# Patient Record
Sex: Female | Born: 1945 | Race: Black or African American | Hispanic: No | Marital: Single | Smoking: Former smoker
Health system: Southern US, Community
[De-identification: ages and names within clinical notes are randomized; demographics above are authoritative.]

## PROBLEM LIST (undated history)

## (undated) DIAGNOSIS — R55 Syncope and collapse: Secondary | ICD-10-CM

## (undated) DIAGNOSIS — E785 Hyperlipidemia, unspecified: Secondary | ICD-10-CM

## (undated) DIAGNOSIS — I10 Essential (primary) hypertension: Secondary | ICD-10-CM

## (undated) DIAGNOSIS — N189 Chronic kidney disease, unspecified: Secondary | ICD-10-CM

## (undated) DIAGNOSIS — K279 Peptic ulcer, site unspecified, unspecified as acute or chronic, without hemorrhage or perforation: Secondary | ICD-10-CM

## (undated) DIAGNOSIS — D649 Anemia, unspecified: Secondary | ICD-10-CM

## (undated) DIAGNOSIS — I480 Paroxysmal atrial fibrillation: Secondary | ICD-10-CM

## (undated) DIAGNOSIS — K219 Gastro-esophageal reflux disease without esophagitis: Secondary | ICD-10-CM

## (undated) DIAGNOSIS — E876 Hypokalemia: Secondary | ICD-10-CM

## (undated) HISTORY — PX: CATARACT EXTRACTION: SUR2

## (undated) HISTORY — DX: Hyperlipidemia, unspecified: E78.5

## (undated) HISTORY — DX: Essential (primary) hypertension: I10

## (undated) HISTORY — DX: Gastro-esophageal reflux disease without esophagitis: K21.9

## (undated) HISTORY — PX: ABDOMINAL HYSTERECTOMY: SHX81

## (undated) HISTORY — DX: Peptic ulcer, site unspecified, unspecified as acute or chronic, without hemorrhage or perforation: K27.9

## (undated) HISTORY — DX: Syncope and collapse: R55

## (undated) HISTORY — DX: Paroxysmal atrial fibrillation: I48.0

## (undated) HISTORY — PX: LIPOMA RESECTION: SHX23

## (undated) NOTE — *Deleted (*Deleted)
Patient seen and discussed with PA Ahmed Prima, I agree with her documentation. 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   Regarding her afib Most recently has been on flecanide by Dr Lovena Le. Initially afib symptoms improved. 10/04/20 phone call with recurrent afib symptoms, flecanide was increased to 100mg  bid on 10/10/20. From prior notes has not tolerated av nodal agents prior due to bradycardia.   Reports dizziness, generlaized weakness, near syncope at home.   WBC 7.6 Hgb 9.1 Plt 424 BNP 893 K 3.6 Cr 4.30 (was 2.6 during 08/2020 labd) BUN 59 hstrop 29--> 27 COVID neg CXR no acute process EKG    Difficultly controlling afib as reported above. Intolerant to av nodal agents due to bradycardia. Has been on flecanide. Of note even prior to 10/18 increase of flecanide to 100mg  bid she was in a junctional rhythm at a 09/11/20 ER visit. Today junctional in the 40s initially. Later in the day SR but sinus pauses longest 4.6 seconds. Flecanide stopped. 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 May require pacemaker placement, would recommend EP evaluation this admission. Recommend transfer to [facility] to medicine service, further workup for AKI on CKD and anemia per medicine, will have EP evaluate for tachy brady syndrome and possible pacemaker placement. Hold eliquis for now. Would use prn IV lopressor 2.5mg  if issues with afib with RVR this admission.      Carlyle Dolly MD

---

## 2007-04-02 ENCOUNTER — Ambulatory Visit (HOSPITAL_COMMUNITY): Admission: RE | Admit: 2007-04-02 | Discharge: 2007-04-02 | Payer: Self-pay

## 2007-05-26 ENCOUNTER — Ambulatory Visit: Payer: Self-pay | Admitting: Cardiology

## 2007-05-26 ENCOUNTER — Ambulatory Visit: Payer: Self-pay | Admitting: Gastroenterology

## 2007-06-13 ENCOUNTER — Encounter: Payer: Self-pay | Admitting: Cardiology

## 2007-06-13 ENCOUNTER — Ambulatory Visit: Payer: Self-pay

## 2007-10-15 ENCOUNTER — Ambulatory Visit: Payer: Self-pay

## 2007-10-15 ENCOUNTER — Ambulatory Visit: Payer: Self-pay | Admitting: Cardiology

## 2007-10-15 LAB — CONVERTED CEMR LAB
BUN: 11 mg/dL (ref 6–23)
Chloride: 97 meq/L (ref 96–112)
GFR calc Af Amer: 82 mL/min
Glucose, Bld: 129 mg/dL — ABNORMAL HIGH (ref 70–99)
Sodium: 139 meq/L (ref 135–145)

## 2007-10-17 ENCOUNTER — Ambulatory Visit: Payer: Self-pay

## 2007-10-17 ENCOUNTER — Ambulatory Visit (HOSPITAL_COMMUNITY): Admission: RE | Admit: 2007-10-17 | Discharge: 2007-10-17 | Payer: Self-pay | Admitting: Cardiology

## 2007-10-27 ENCOUNTER — Ambulatory Visit: Payer: Self-pay | Admitting: Cardiology

## 2007-12-11 ENCOUNTER — Ambulatory Visit: Payer: Self-pay | Admitting: Cardiology

## 2008-04-22 ENCOUNTER — Ambulatory Visit: Payer: Self-pay | Admitting: Cardiology

## 2008-09-23 ENCOUNTER — Ambulatory Visit: Payer: Self-pay | Admitting: Cardiology

## 2009-08-22 ENCOUNTER — Encounter: Payer: Self-pay | Admitting: Cardiology

## 2009-12-01 ENCOUNTER — Ambulatory Visit: Payer: Self-pay | Admitting: Orthopedic Surgery

## 2009-12-01 ENCOUNTER — Telehealth: Payer: Self-pay | Admitting: Orthopedic Surgery

## 2009-12-01 DIAGNOSIS — M5430 Sciatica, unspecified side: Secondary | ICD-10-CM | POA: Insufficient documentation

## 2010-01-11 ENCOUNTER — Telehealth: Payer: Self-pay | Admitting: Orthopedic Surgery

## 2010-05-03 ENCOUNTER — Ambulatory Visit: Payer: Self-pay | Admitting: Cardiology

## 2010-05-03 DIAGNOSIS — I10 Essential (primary) hypertension: Secondary | ICD-10-CM | POA: Insufficient documentation

## 2010-05-03 DIAGNOSIS — R55 Syncope and collapse: Secondary | ICD-10-CM | POA: Insufficient documentation

## 2010-05-04 ENCOUNTER — Encounter: Payer: Self-pay | Admitting: Cardiology

## 2010-05-05 ENCOUNTER — Telehealth (INDEPENDENT_AMBULATORY_CARE_PROVIDER_SITE_OTHER): Payer: Self-pay | Admitting: *Deleted

## 2010-05-09 ENCOUNTER — Encounter: Payer: Self-pay | Admitting: Orthopedic Surgery

## 2010-05-17 ENCOUNTER — Ambulatory Visit: Payer: Self-pay | Admitting: Orthopedic Surgery

## 2010-05-17 DIAGNOSIS — S92309A Fracture of unspecified metatarsal bone(s), unspecified foot, initial encounter for closed fracture: Secondary | ICD-10-CM | POA: Insufficient documentation

## 2010-06-08 ENCOUNTER — Encounter: Payer: Self-pay | Admitting: Orthopedic Surgery

## 2010-06-28 ENCOUNTER — Ambulatory Visit: Payer: Self-pay | Admitting: Orthopedic Surgery

## 2010-11-29 ENCOUNTER — Encounter: Payer: Self-pay | Admitting: Orthopedic Surgery

## 2011-01-23 NOTE — Progress Notes (Signed)
Summary: Triam/HCTZ not available  Medications Added HYDROCHLOROTHIAZIDE 50 MG TABS (HYDROCHLOROTHIAZIDE) Take one tablet by mouth daily.       Phone Note From Pharmacy   Summary of Call: Received faxed note from [facility] [place] that Triam/HCTZ is not available in any strength. Spoke with pharmacist who states there is a Electrical engineer. She states some pharmacies are getting sporatic supplies of this, but they do not have it. Can this be changed to another medication? Initial call taken by: Gurney Maxin, RN, BSN,  May 05, 2010 4:32 PM  Follow-up for Phone Call        [name] [name] - see last note.  Could use HCTZ 50 mg daily instead. Follow-up by: Beckie Salts, MD, [facility],  May 08, 2010 3:17 PM  Additional Follow-up for Phone Call Additional follow up Details #1::        Prescription will be sent for this.    New/Updated Medications: HYDROCHLOROTHIAZIDE 50 MG TABS (HYDROCHLOROTHIAZIDE) Take one tablet by mouth daily. Prescriptions: HYDROCHLOROTHIAZIDE 50 MG TABS (HYDROCHLOROTHIAZIDE) Take one tablet by mouth daily.  #30 x 6   Entered by:   Gurney Maxin, RN, BSN   Authorized by:   Beckie Salts, MD, [facility]   Signed by:   Gurney Maxin, RN, BSN on 05/08/2010   Method used:   Electronically to        [facility] [place] (retail)       [place]  [address]       Ph: [phone]       Fax: [phone]   RxID:   [id number]

## 2011-01-23 NOTE — Progress Notes (Signed)
Summary: call from patient still having pain LT hip   Phone Note Call from Patient   Caller: Patient Summary of Call:  Patienit called to relay that she's having LT hip and LT side of back pain.  Had used dose pack per December appt.  Asking if there is anything else to be prescribed or advise re-check?   States has refill Gabapentin for the other problem, RT side hip, pain, numbness. Ph [phone] [facility] [place] Initial call taken by: [name],  January 11, 2010 10:01 AM  Follow-up for Phone Call        refill dose pack and neurontin take both  Follow-up by: Arther Abbott MD,  January 11, 2010 11:24 AM    Prescriptions: NEURONTIN 100 MG CAPS (GABAPENTIN) 1 by mouth at bedtime  #30 x 1   Entered by:   Peter Minium   Authorized by:   Arther Abbott MD   Signed by:   Peter Minium on 01/11/2010   Method used:   [facility] ...       [facility]  [address] [phone][facility] (retail)       [address]       [place], [place]  [address]       Ph: [phone] or [phone]       Fax: [phone]   RxID:   [id number] PREDNISONE (PAK) 5 MG TABS (PREDNISONE) as directed  #1 x 0   Entered by:   Peter Minium   Authorized by:   Arther Abbott MD   Signed by:   Peter Minium on 01/11/2010   Method used:   [facility] ...       [facility]  [address] [phone]* (retail)       [address]       [place], [place]  [address]       Ph: [phone] or [phone]       Fax: [phone]   RxID:   [id number]

## 2011-01-23 NOTE — Letter (Signed)
Summary: History form  History form   Imported By: Ruffin Pyo 05/19/2010 09:34:33  _____________________________________________________________________  External Attachment:    Type:   Image     Comment:   External Document

## 2011-01-23 NOTE — Assessment & Plan Note (Signed)
Summary: NEW PROB/FX 5TH METATARSAL/XR [place]/BRING'G FILM,RPT/REF C...   Vital Signs:  Patient profile:   65 year old female Height:      62 inches Weight:      186 pounds Pulse rate:   76 / minute Resp:     16 per minute  Vitals Entered By: Arther Abbott MD (May 17, 2010 9:35 AM)  Visit Type:  new problem Referring Provider:  self Primary Provider:  Dr. Ramond Dial  CC:  right foot pain.  History of Present Illness: I saw [name] [name] in the office today for a followup visit.  She is a 65 years old woman with the complaint of:  right foot, 5th metatarsal fracture.  DOI 05/14/10.  [facility] xrays 05/16/10 right foot for review.  Meds: Maxzide, Lasix, Prilosec, Beta blocker, Potassium, Diovan, Uloric, Colcrys.  throbbing pain 9/10, worse with walking, sudden onset with injury.  She twisted her foot while ambulating. Her foot was caught in someone else's cane.  Pain was not relieved by ibuprofen 600 mg  She is a noticeable limp    Allergies: 1)  ! * Tylenol With Codeine 2)  ! * Tramadol 3)  ! * Allpurinol  Social History: Patient is single Tobacco Use - No Alcohol Use - no Retired   Review of Systems Immunology:  Complains of seasonal allergies; denies sinus problems and allergic to bee stings.  The review of systems is negative for Constitutional, Cardiovascular, Respiratory, Gastrointestinal, Genitourinary, Neurologic, Musculoskeletal, Endocrine, Psychiatric, Skin, HEENT, and Hemoatologic.   Foot/Ankle Exam  General:    Well-developed, well-nourished ,normal body habitus; no deformities, normal grooming.    Gait:    antalgic.    Skin:    Intact with no scars, lesions, rashes, cafe-au-lait spots or bruising.    Inspection:    RT FOOT LATAERAL AND DORSAL swelling:   Palpation:    tenderness R-MID FOOT LATAERALLY tenderness R-forefoot:   Vascular:    dorsalis pedis and posterior tibial pulses 2+ and symmetric, capillary refill < 2  seconds, normal hair pattern, no evidence of ischemia.   Sensory:    gross sensation intact bilaterally in lower extremities.    Motor:    Motor strength 5/5 bilaterally for ankle dorsiflexion, ankle plantar flexion, ankle inversion and ankle eversion.    Ankle Exam:    Right:    Inspection:  Normal    Palpation:  Normal    Stability:  stable    Left:    Inspection:  Normal    Palpation:  Normal    Stability:  stable  Foot Exam:    Right:    Inspection:  Abnormal    Palpation:  Abnormal    Stability:  stable    Tenderness:  yes    Swelling:  yes    Left:    Inspection:  Normal    Palpation:  Normal   Impression & Recommendations:  Problem # 1:  CLOSED FRACTURE OF METATARSAL BONE (ICD-825.25) Assessment New  [facility]   Orders: New Patient Level III [id number]) Metatarsal Fx [id number])  SHORT CAM WALKER   Medications Added to Medication List This Visit: 1)  Norco 5-325 Mg Tabs (Hydrocodone-acetaminophen) .... One by mouth q 4 hrs as needed pain  Patient Instructions: 1)  no weight bearing x 6 weeks  2)  xrays in 6 weeks  Prescriptions: NORCO 5-325 MG TABS (HYDROCODONE-ACETAMINOPHEN) ONE by mouth Q 4 HRS as needed PAIN  #60 x 1   Entered and Authorized  by:   Arther Abbott MD   Signed by:   Arther Abbott MD on 05/17/2010   Method used:   Print then Give to Patient   RxID:   [id number]

## 2011-01-23 NOTE — Assessment & Plan Note (Signed)
Summary: rov  Medications Added ULORIC 40 MG TABS (FEBUXOSTAT) take 1 tab daily INDOCIN 25 MG/5ML SUSP (INDOMETHACIN) take as needed      Allergies Added:   Visit Type:  Follow-up Primary Provider:  Dr. Ramond Dial   History of Present Illness: 65 year old woman presents for a followup visit. She was last seen in October 2009. She reports doing well without any frank syncope. She has only occasional orthostatic dizziness that does not significantly limit her. She reports working in her garden without functional limitation. She's had no chest pain or palpitations.  Current Medications (verified): 1)  Klor-Con 10 10 Meq Cr-Tabs (Potassium Chloride) .... Take 1 Tablet By Mouth Once A Day 2)  Metoprolol Succinate 25 Mg Xr24h-Tab (Metoprolol Succinate) .... Take 1 Tablet By Mouth Once A Day 3)  Triamterene-Hctz 75-50 Mg Tabs (Triamterene-Hctz) .... Take 1 Tablet By Mouth Once A Day 4)  Prilosec 40 Mg Cpdr (Omeprazole) .... Take 1 Tablet By Mouth Once A Day 5)  Lasix 20 Mg Tabs (Furosemide) .... Take 1 Tablet By Mouth Once A Day 6)  Diovan Hct 160-25 Mg Tabs (Valsartan-Hydrochlorothiazide) .... Take One-Half By Mouth Every Other Day 7)  Simvastatin 40 Mg Tabs (Simvastatin) .... Take 1 Tablet By Mouth Once A Day 8)  Meclizine Hcl 25 Mg Tabs (Meclizine Hcl) .... Take One-Half By Mouth Three Times A Day 9)  Colchicine 0.6 Mg Tabs (Colchicine) .... Use As Directed 10)  Neurontin 100 Mg Caps (Gabapentin) .Marland Kitchen.. 1 By Mouth At Bedtime 11)  Uloric 40 Mg Tabs (Febuxostat) .... Take 1 Tab Daily 12)  Indocin 25 Mg/41ml Susp (Indomethacin) .... Take As Needed  Allergies (verified): 1)  ! * Tylenol With Codeine 2)  ! * Tramadol 3)  ! * Allpurinol  Past History:  Past Medical History: Last updated: 04/10/2010 G E R D Hyperlipidemia Hypertension PUD Gout Neurocardiogenic syncope  Social History: Last updated: 04/10/2010 Patient is single Tobacco Use - No Alcohol Use - no Retired    Clinical Review Panels:  Echocardiogram Echocardiogram SUMMARY   -  Overall left ventricular systolic function was normal. Left         ventricular ejection fraction was estimated , range being 55         % to 65 %. There were no left ventricular regional wall         motion abnormalities. Left ventricular wall thickness was         mildly increased.   -  Aortic valve thickness was mildly increased. There was trivial         aortic valvular regurgitation.   -  There was mild mitral annular calcification.   -  The left atrium was mildly dilated.   -  The estimated peak pulmonary artery systolic pressure was mildly         increased. (06/13/2007)    Review of Systems  The patient denies anorexia, fever, weight loss, chest pain, syncope, dyspnea on exertion, peripheral edema, melena, and hematochezia.         She has had some problems with gout flares. Otherwise reviewed and negative.  Vital Signs:  Patient profile:   65 year old female Height:      63 inches Weight:      189 pounds BMI:     33.60 Pulse rate:   83 / minute BP sitting:   135 / 73  (right arm)  Vitals Entered By: Doretha Sou, CNA (May 03, 2010 1:38 PM)  Physical  Exam  Additional Exam:  Overweight woman in no acute distress. HEENT: Conjunctiva and lids normal, oropharynx clear. Neck: Supple, no elevated JVP or bruits. Lungs: Clear to auscultation, nonlabored. Cardiac: Regular rate and rhythm, no S3. Pharynx: No pitting edema. Skin: Warm and dry.   EKG  Procedure date:  05/03/2010  Findings:      Sinus rhythm at 68 beats per minute with nonspecific ST-T wave changes.  Impression & Recommendations:  Problem # 1:  VASOVAGAL SYNCOPE (ICD-780.2)  No frank recurrences, tolerating low-dose beta blocker therapy well. We will go to a one-year visit. She will let us know if symptoms recur with more frequency. Refill given for metoprolol.  The following medications were removed from the medication  list:    Prednisone (pak) 5 Mg Tabs (Prednisone) .Marland Kitchen... As directed    Prednisone (pak) 5 Mg Tabs (Prednisone) .Marland Kitchen... As directed for 12 days Her updated medication list for this problem includes:    Metoprolol Succinate 25 Mg Xr24h-tab (Metoprolol succinate) .Marland Kitchen... Take 1 tablet by mouth once a day  Problem # 2:  ESSENTIAL HYPERTENSION, BENIGN (ICD-401.1)  Patient needed limited refills for triamterene/HCTZ and also Lasix. These were provided.  Her updated medication list for this problem includes:    Metoprolol Succinate 25 Mg Xr24h-tab (Metoprolol succinate) .Marland Kitchen... Take 1 tablet by mouth once a day    Triamterene-hctz 75-50 Mg Tabs (Triamterene-hctz) .Marland Kitchen... Take 1 tablet by mouth once a day    Lasix 20 Mg Tabs (Furosemide) .Marland Kitchen... Take 1 tablet by mouth once a day    Diovan Hct 160-25 Mg Tabs (Valsartan-hydrochlorothiazide) .Marland Kitchen... Take one-half by mouth every other day  Patient Instructions: 1)  Your physician recommends that you schedule a follow-up appointment in: 1 year Prescriptions: METOPROLOL SUCCINATE 25 MG XR24H-TAB (METOPROLOL SUCCINATE) Take 1 tablet by mouth once a day  #90 x 3   Entered by:   Tye Savoy RN   Authorized by:   Beckie Salts, MD, [facility]   Signed by:   Tye Savoy RN on 05/03/2010   Method used:   Print then Give to Patient   RxID:   [id number] LASIX 20 MG TABS (FUROSEMIDE) Take 1 tablet by mouth once a day  #30 x 0   Entered by:   Tye Savoy RN   Authorized by:   Beckie Salts, MD, [facility]   Signed by:   Tye Savoy RN on 05/03/2010   Method used:   Electronically to        [facility] [place] (retail)       [place]  [address]       Ph: [phone]       Fax: [phone]   RxID:   [id number] TRIAMTERENE-HCTZ 75-50 MG TABS (TRIAMTERENE-HCTZ) Take 1 tablet by mouth once a day  #30 x 0   Entered by:   Tye Savoy RN   Authorized by:   Beckie Salts, MD, [facility]   Signed by:   Tye Savoy RN on 05/03/2010   Method used:   Electronically to        [facility]. [place] (retail)       [place]  [address]       Ph: [phone]       Fax: [phone]   RxID:   [id number] METOPROLOL SUCCINATE 25 MG XR24H-TAB (METOPROLOL SUCCINATE) Take  1 tablet by mouth once a day  #90 x 3   Entered by:   Tye Savoy RN   Authorized by:   Beckie Salts, MD, [facility]   Signed by:   Tye Savoy RN on 05/03/2010   Method used:   Electronically to        [facility]. [place] (retail)       [place]  [address]       Ph: [phone]       Fax: [phone]   RxID:   [id number]

## 2011-01-23 NOTE — Assessment & Plan Note (Signed)
Summary: 6 WK RE-CK/XRAY RT FOOT/METATARS/BCBS/CAF   Visit Type:  Follow-up Referring Provider:  self Primary Provider:  Dr. Ramond Dial  CC:  fx care rt foot.  History of Present Illness: I saw [name] [name] in the office today for a followup visit.  She is a 65 years old woman with the complaint of:  right foot, 5th metatarsal fracture.  DOI 05/14/10.  [facility] xrays 05/16/10 right foot for review.  Meds: Maxzide, Lasix, Prilosec, Beta blocker, Potassium, Diovan, Uloric, Colcrys, Norco 5 for pain.  Today is 6 week recheck xray rt foot after cam walker and no weightbearing.  No problems, pain med only as needed.  Says she has been putting weight on foot the whole time.  Allergies: 1)  ! * Tylenol With Codeine 2)  ! * Tramadol 3)  ! * Allpurinol  Physical Exam  Additional Exam:  Normal appearance, normal pulse in the RIGHT foot, skin normal, sensation normal.  Patient ambulating full weightbearing despite our recommendations  No tenderness at the fracture site ankle range of motion is normal muscle tone and the foot is normal  No swelling.  X-rays were obtained   Impression & Recommendations:  Problem # 1:  CLOSED FRACTURE OF METATARSAL BONE (ICD-825.25) Assessment Improved  x-ray RIGHT foot 3 views true Jones fracture proximal fifth metatarsal does not show any signs of healing.  This is not unusual.  She's been non-compliant weightbearing which makes it even more so she is nontender  try a foot orthotic for her if this proves to be successful fine if not drive to go in a nonweightbearing cast for  Orders: Post-Op Check [id number]) Foot x-ray complete, minimum 3 views [id number])  Patient Instructions: 1)  wear shoe insert x 6 weeks  2)  follow up as needed

## 2011-01-23 NOTE — Medication Information (Signed)
Summary: RX Folder  RX Folder   Imported By: [name] 06/28/2010 19:20:18  _____________________________________________________________________  External Attachment:    Type:   Image     Comment:   External Document

## 2011-01-26 NOTE — Miscellaneous (Signed)
Summary: Physician's order for walker  Physician's order for walker   Imported By: Ruffin Pyo 06/08/2010 15:36:59  _____________________________________________________________________  External Attachment:    Type:   Image     Comment:   External Document

## 2011-02-22 ENCOUNTER — Encounter: Payer: Self-pay | Admitting: Orthopedic Surgery

## 2011-02-22 ENCOUNTER — Ambulatory Visit (INDEPENDENT_AMBULATORY_CARE_PROVIDER_SITE_OTHER): Payer: Medicare PPO | Admitting: Orthopedic Surgery

## 2011-02-22 DIAGNOSIS — M6740 Ganglion, unspecified site: Secondary | ICD-10-CM

## 2011-02-27 ENCOUNTER — Encounter: Payer: Self-pay | Admitting: Orthopedic Surgery

## 2011-03-01 NOTE — Assessment & Plan Note (Signed)
Summary: CYST ON RT HAND  BRINGING FILM/REPORT/BCBS/BSF    NEW PROBLEM   Visit Type:  new problem Referring Provider:  self Primary Provider:  Dr. Ramond Dial  CC:  right hand pain.  History of Present Illness: I saw [name] [name] in the office today for an initial visit.  She is a 65 years old woman with the complaint of:  right hand  This is a 65 year old female, who has had a cyst on the back of her wrist with wrist pain since December of last year, but has since gotten better from wearing a brace. She does complain of some numbness and tingling in the thumb, index, and long finger at the fingertips.   She also has some back pain.  Medications: Diovan, Uloric, Poassium, Simvastatin, Maxide, Lasix, Metoprolol, Celebrex.  Allergies: 1)  ! * Tylenol With Codeine 2)  ! * Tramadol 3)  ! * Allpurinol  Physical Exam  Msk:  The patient is well developed and nourished, with normal grooming and hygiene. The body habitus is   ML  Pulses:  pulses normal in right hand  Extremities:  right dorsal wrist small ulnar sided ganglion   no tenderenss   normal range wrist  Neurologic:  normal soft touch  Skin:  intact without lesions or rashes Psych:  alert and cooperative; normal mood and affect; normal attention span and concentration   Impression & Recommendations:  Problem # 1:  GANGLION OF JOINT (ICD-727.41) Assessment New outside film on disc reviewed and report reviewed : NO acute diseases  Orders: Est. Patient Level III [id number])  Patient Instructions: 1)  Call us if any pain or the cyst changes in size    Orders Added: 1)  Est. Patient Level III [id number]

## 2011-05-01 ENCOUNTER — Ambulatory Visit (INDEPENDENT_AMBULATORY_CARE_PROVIDER_SITE_OTHER): Payer: Medicare PPO | Admitting: Cardiology

## 2011-05-01 ENCOUNTER — Ambulatory Visit: Payer: Medicare PPO | Admitting: Cardiology

## 2011-05-01 ENCOUNTER — Encounter: Payer: Self-pay | Admitting: Cardiology

## 2011-05-01 VITALS — BP 130/72 | HR 65 | Wt 185.0 lb

## 2011-05-01 DIAGNOSIS — I10 Essential (primary) hypertension: Secondary | ICD-10-CM

## 2011-05-01 DIAGNOSIS — R55 Syncope and collapse: Secondary | ICD-10-CM

## 2011-05-01 MED ORDER — METOPROLOL SUCCINATE ER 25 MG PO TB24: 25.0000 mg | ORAL_TABLET | Freq: Every day | ORAL | Status: DC

## 2011-05-01 NOTE — Assessment & Plan Note (Signed)
No recurrence. Overall stable on beta blocker therapy.

## 2011-05-01 NOTE — Assessment & Plan Note (Signed)
No changes made in medical regimen today. We did discuss a basic walking regimen. Follow up with primary care.

## 2011-05-01 NOTE — Patient Instructions (Addendum)
**Note De-identified Yumi Insalaco Obfuscation** Your physician recommends that you continue on your current medications as directed. Please refer to the Current Medication list given to you today.  Your physician recommends that you schedule a follow-up appointment in: 1 year  

## 2011-05-01 NOTE — Progress Notes (Signed)
Clinical Summary [name] [name] is a 65 y.o.female presenting for followup. She was seen in May 2011.  She reports 2 episodes of dizziness over the last year, not associated with frank syncope. These occurred when she was standing up from a crouched position a number of times. She reports compliance with her medications.  She is not exercising regularly, does do some work in her garden. We talked about pursuing a basic walking regimen. Otherwise no changes anticipated at this time.   Allergies  Allergen Reactions  . Allopurinol   . Tramadol   . Tylenol-Codeine     Current outpatient prescriptions:colchicine 0.6 MG tablet, Take 0.6 mg by mouth daily. As directed   , Disp: , Rfl: ;  diphenhydrAMINE (SOMINEX) 25 MG tablet, Take 25 mg by mouth as needed.  , Disp: , Rfl: ;  febuxostat (ULORIC) 40 MG tablet, Take 80 mg by mouth daily.  , Disp: , Rfl: ;  furosemide (LASIX) 20 MG tablet, Take 20 mg by mouth daily.  , Disp: , Rfl:  gabapentin (NEURONTIN) 100 MG tablet, Take 100 mg by mouth at bedtime.  , Disp: , Rfl: ;  hydrochlorothiazide 50 MG tablet, Take 50 mg by mouth daily.  , Disp: , Rfl: ;  HYDROcodone-acetaminophen (NORCO) 5-325 MG per tablet, Take 1 tablet by mouth every 4 (four) hours as needed.  , Disp: , Rfl: ;  meclizine (ANTIVERT) 25 MG tablet, Take 25 mg by mouth 3 (three) times daily as needed. Take One half  Tablet by mouth three times a day  , Disp: , Rfl:  metoprolol succinate (TOPROL XL) 25 MG 24 hr tablet, Take 25 mg by mouth daily.  , Disp: , Rfl: ;  omeprazole (PRILOSEC) 40 MG capsule, Take 40 mg by mouth daily.  , Disp: , Rfl: ;  potassium chloride SA (KLOR-CON M10) 10 MEQ tablet, Take 10 mEq by mouth daily.  , Disp: , Rfl: ;  simvastatin (ZOCOR) 40 MG tablet, Take 40 mg by mouth daily.  , Disp: , Rfl: ;  valsartan (DIOVAN) 160 MG tablet, Take 160 mg by mouth daily.  , Disp: , Rfl:  DISCONTD: indomethacin (INDOCIN) 25 MG/5ML SUSP, Take by mouth as needed.  , Disp: , Rfl: ;  DISCONTD:  valsartan-hydrochlorothiazide (DIOVAN-HCT) 160-25 MG per tablet, Take 1 tablet by mouth. Take one half tablet by mouth every other day , Disp: , Rfl:   Past Medical History  Diagnosis Date  . GERD (gastroesophageal reflux disease)   . Hyperlipidemia   . Hypertension   . PUD (peptic ulcer disease)   . Gout   . Syncope     Neurocardiogenic    Social History Ms. Garvey reports that she has quit smoking. Her smoking use included Cigarettes. She has never used smokeless tobacco. Ms. Aguas reports that she does not drink alcohol.  Review of Systems No fevers chills, cough, progressive shortness of breath, chest pain. Otherwise negative.  Physical Examination Filed Vitals:   05/01/11 1258  BP: 130/72  Pulse: 65  Overweight woman in no acute distress. HEENT: Conjunctiva and lids normal, oropharynx clear. Neck: Supple, no elevated JVP or bruits. Lungs: Clear to auscultation, nonlabored. Cardiac: Regular rate and rhythm, no S3. Pharynx: No pitting edema. Skin: Warm and dry.  Studies Echocardiogram 06/13/2007: -  Overall left ventricular systolic function was normal. Left         ventricular ejection fraction was estimated , range being 55         % to  65 %. There were no left ventricular regional wall         motion abnormalities. Left ventricular wall thickness was         mildly increased.   -  Aortic valve thickness was mildly increased. There was trivial         aortic valvular regurgitation.   -  There was mild mitral annular calcification.   -  The left atrium was mildly dilated.   -  The estimated peak pulmonary artery systolic pressure was mildly         increased.   Problem List and Plan

## 2011-05-08 NOTE — Assessment & Plan Note (Signed)
[facility]                            CARDIOLOGY OFFICE NOTE   NAME:[name], [name] PEPI                      MRN:          [id number]  DATE:04/22/2008                            DOB:          1946/02/26    PRIMARY CARE PHYSICIAN:  Andres Shad, M.D.   REASON FOR VISIT:  Follow up dizziness and previous history of syncope.   HISTORY OF PRESENT ILLNESS:  [name] [name] comes back in for a routine  visit.  She states that she has actually done very well.  She has not  had any frank syncope.  She has been tolerating Toprol XL and thinks  that this has been helping.  She has only had one brief episode of  dizziness since I last saw her.  No frank palpitations.  No exertional  chest pain.  No significant exertional dyspnea.  She does have some  reflux symptoms.  Electrocardiogram today shows sinus rhythm with  nonspecific T-wave changes, specifically in the lateral leads.  These  are somewhat progressive compared to the previous tracing from October  of 2008.   ALLERGIES:  NO KNOWN DRUG ALLERGIES.   PRESENT MEDICATIONS:  1. Triamterene/hydrochlorothiazide 75/50 mg p.o. daily.  2. Klor-Con 10 mEq p.o. daily.  3. Prilosec 40 mg p.o. daily.  4. Lasix 20 mg p.o. daily.  5. Diovan/HCT 160/25 mg one-half tablet p.o. daily.  6. Simvastatin 40 mg p.o. daily.  7. Meclizine 25 mg 1/2 tablet p.o. t.i.d.  8. Toprol XL 25 mg p.o. daily.   REVIEW OF SYSTEMS:  As found in the history of present illness;  otherwise negative.   PHYSICAL EXAMINATION:  VITAL SIGNS:  Blood pressure is 113/69, heart  rate is 73 and regular, weight is 192 pounds.  The patient is  comfortable and in no acute distress.  NECK:  No elevated jugular venous pressure, no loud bruits, no  thyromegaly.  LUNGS:  Clear without labored breathing.  CARDIAC:  Regular rate and rhythm.  No loud murmur or gallop.  EXTREMITIES:  No pitting edema.   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  Otherwise, the  patient is not reporting any new  symptomatology.  She is tolerating low-dose beta-blocker therapy, which  we will continue, and I will plan for her to come back for a clinical  assessment in 6 months.     Satira Sark, MD  Electronically Signed    SGM/MedQ  DD: 04/22/2008  DT: 04/22/2008  Job #: [id number]   cc:   Andres Shad

## 2011-05-08 NOTE — Assessment & Plan Note (Signed)
[place] OFFICE NOTE   NAME:Troyer, [name] [name]                      MRN:          [id number]  DATE:05/26/2007                            DOB:          [birth year]    OFFICE CONSULTATION:   REASON FOR REFERRAL:  Weight loss.   HISTORY OF PRESENT ILLNESS:  [name] [name] is a 65 year old African-  American female referred through the courtesy of Dr. Ramond Dial.  During a routine office visit Dr. Teryl Lucy noted an 11-pound weight loss  over the course of 3 months.  The patient states she had changed her  diet substantially to help control her reflux symptoms when she was out  of medication.  She has returned to Prilosec 40 mg a day and returned to  a more typical diet for her, and she has gained about 4 pounds back over  the pat few weeks.  She has a history of chronic GERD and small-volume  hematochezia.  She underwent colonoscopy and upper endoscopy by Dr.  Earley Brooke in [place], [place].  Both exams were performed in 2006.  She  states internal hemorrhoids and two small polyps were found on the  colonoscopy and two small ulcers and acid reflux were found on the upper  endoscopy.  She has intermittent mild problems with constipation and had  noted problems with small-volume hematochezia prior to her colonoscopy.  She has not noted any hematochezia since that time.  She states her  reflux symptoms are well-controlled when she remains on Prilosec 40 mg a  day, but her symptoms return if she discontinues medication.  She has a  maternal grandmother with colon cancer.  No other family members with  colon cancer, colon polyps or inflammatory bowel disease.   PAST MEDICAL HISTORY:  1. Hypertension.  2. Hyperlipidemia.  3. GERD.  4. Colon polyps.  5. Hemorrhoids.  6. Allergic rhinitis.   CURRENT MEDICATIONS:  Listed on the chart, updated and reviewed.   MEDICATION ALLERGIES:  None known.   SOCIAL HISTORY:  Per  the handwritten form.   REVIEW OF SYSTEMS:  Per the handwritten form.   PHYSICAL EXAMINATION:  GENERAL:  An overweight African-American female  in no acute distress.  VITAL SIGNS:  Height 5 feet 2-1/2 inches, weight 179.2 pounds.  Blood  pressure is 100/74, pulse 72 and regular.  HEENT:  Anicteric sclerae.  Oropharynx clear.  CHEST:  Clear to auscultation bilaterally.  CARDIAC:  Regular rate and rhythm without murmurs appreciated.  ABDOMEN:  Soft, nontender, nondistended, normoactive bowel sounds.  No  palpable organomegaly, masses or hernias.  EXTREMITIES:  Without clubbing, cyanosis, or edema.  NEUROLOGIC:  Alert and oriented x3.  Grossly nonfocal.   ASSESSMENT AND PLAN:  1. Mild weight loss.  This is likely explained by her recent dietary      change.  She has gained 4 pounds back by her report since her      office visit with Dr. Teryl Lucy.  She furthermore states she would      like to lose weight but has not specifically tried to do so.  I  have advised her to monitor her weight on a weekly basis and if she      has further unexplained weight loss, she is to return for follow-      up.  2. Gastroesophageal reflux disease.  Maintain standard antireflux      measures and Prilosec 40 mg p.o. q.a.m.  Will request records from      Dr. Earley Brooke.  3. History of colon polyps.  Again, request records from Dr. Earley Brooke to      determine the appropriate follow-up interval.     Norberto Sorenson T. Fuller Plan, MD, [facility]  Electronically Signed    MTS/MedQ  DD: 05/26/2007  DT: 05/27/2007  Job #: [id number]   cc:   Andres Shad

## 2011-05-08 NOTE — Assessment & Plan Note (Signed)
[facility]                            CARDIOLOGY OFFICE NOTE   NAME:[name] [name], [name] [name]                      MRN:          [id number]  DATE:12/11/2007                            DOB:          [birth year]    PRIMARY CARE PHYSICIAN:  Dr. Andres Shad.   REASON FOR VISIT:  Follow up syncope.   HISTORY OF PRESENT ILLNESS:  [name] [name] comes in, having last been seen  in November.  She has not had any frank syncope and in fact, never was  able to call in any strips using her event recorder.  She states that  back on the 7th in the morning after she got up to go to the bathroom,  after being seated on the commode, she felt an episode of dizziness,  although did not have palpitations, chest pain, or syncope.  I still  think this may well be neurocardiogenic syncope, and we talked about  this again today.  We also talked about trying a low dose beta blocker  to see if this made any difference.  Otherwise, she has been feeling  well.   ALLERGIES:  No known drug allergies.   PRESENT MEDICATIONS:  1. Tramadol 50 mg p.o. p.r.n.  2. Triamterene/hydrochlorothiazide 75/50 mg p.o. daily.  3. Klor-Con 10 mEq p.o. daily.  4. Colchicine 0.6 mg p.o. p.r.n.  5. Prilosec 40 mg p.o. daily.  6. Lasix 20 mg p.o. daily.  7. Diovan 160/25 1/2 tablet p.o. q.o.d.  8. Simvastatin 40 mg p.o. daily.  9. Meclizine 12.5 mg p.o. t.i.d.   REVIEW OF SYSTEMS:  As described in the history of present illness.   PHYSICAL EXAMINATION:  Blood pressure is 130/80, heart rate 78, weight  190 pounds.  Patient is comfortable in no acute distress.  HEENT:  Conjunctivae is normal.  Oropharynx is clear.  NECK:  Supple.  No elevated jugular venous pressure, no loud bruits.  No  thyromegaly is noted.  LUNGS:  Clear without labored breathing.  CARDIAC:  Regular rate and rhythm.  No loud murmur or gallop.  ABDOMEN:  Soft and nontender.  Normoactive bowel sounds.  EXTREMITIES:  No  significant pitting edema.  Skin is warm and dry.  Distal pulses 1+.  MUSCULOSKELETAL:  No kyphosis is noted.  NEURO/PSYCHIATRIC:  Patient is alert and oriented x3.   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  No  clear dysrhythmias uncovered.  Patient has not had any frank syncope  recently, although transient dizziness earlier in the month.  This  occurred while in the bathroom on the commode.  I would like to try  Toprol XL 12.5 mg daily and see her back over the next three months for  review.  If she manifests recurrent frank syncope, may likely send her  to our electrophysiology division to consider either a tilt table test  or perhaps even an implantable loop recorder.     Satira Sark, MD  Electronically Signed    SGM/MedQ  DD: 12/11/2007  DT: 12/11/2007  Job #: [id number]   cc:   Andres Shad

## 2011-05-08 NOTE — Assessment & Plan Note (Signed)
[facility]                            CARDIOLOGY OFFICE NOTE   NAME:[name] [name], [name] [name]                      MRN:          [id number]  DATE:10/15/2007                            DOB:          07/01/1946    PRIMARY CARE PHYSICIAN:  Andres Shad, M.D.   REASON FOR VISIT:  Recurrent syncope.   HISTORY OF PRESENT ILLNESS:  I saw the patient back in June with a  history of infrequent syncope that seemed to be most consistent with  neurocardiogenic syncope based on description.  At that time, I  recommended that she decrease her diuretic dosing and we also discussed  other measures to address this.  She had an echocardiogram demonstrating  normal left ventricular systolic function with an ejection fraction of  55-65% and no major valvular abnormalities.  Since then she has had an  ENT evaluation and was diagnosed with tinnitus.  She is referred back  today as she has had more episodes of syncope since that time.   The patient states that back in mid September, she was preparing a meal  in a hot kitchen (air conditioner not working) and noticed that she  fairly suddenly began to feel dizzy describing a sense of spinning.  She  held on to the sink and had to be helped to the ground.  She does not  recall the event.  She states that after a few minutes, she felt back to  baseline.  She had a second event in early October when she was seated  in a hairdresser's chair.  She states that she felt a feeling of blurry  vision on the right side of her visual field that extended then across  to the left and was associated with a feeling of dizziness, although,  not frank vertigo.  She denies any palpitations or chest pain with this.  She did manage to take a Meclizine tablet at that time and states that  her symptoms got better.  Otherwise, she has had no exertional  symptomatology.   Today's electrocardiogram shows sinus rhythm with nonspecific ST-T wave  changes.   ALLERGIES:  No known drug allergies.   CURRENT MEDICATIONS:  1. Tramadol 50 mg p.o. p.r.n.  2. Triamterene hydrochlorothiazide 75/50 mg p.o. daily.  3. Klor-Con 10 mEq p.o. daily.  4. Colchicine 0.6 mg p.o. daily.  5. Prilosec 40 mg p.o. daily.  6. Lasix 20 mg p.o. daily.  7. Diovan HCT 160/25 mg 1-1/2 tablets p.o. daily.  8. Meclizine 25 mg p.o. q.6 hours p.r.n.  9. Simvastatin 40 mg p.o. daily.   REVIEW OF SYSTEMS:  As described in history of present illness.   PHYSICAL EXAMINATION:  VITAL SIGNS:  Blood pressure 144/60, heart rate  88, weight 191 pounds.  GENERAL:  The patient is in no acute distress.  No active symptoms.  HEENT:  Conjunctivae and lids normal.  Pharynx clear.  NECK:  Supple, no elevated jugular venous pressure.  No loud bruits.  NO  thyromegaly.  LUNGS:  Clear.  HEART:  Regular rate and rhythm.  2/6 systolic murmur heard  at the base.  No S3 gallop or pericardial rub.  ABDOMEN:  Soft, nontender, normal active bowel sounds.  EXTREMITIES:  No significant pitting edema.  Distal pulses are 2+.  SKIN:  Warm and dry.  MUSCULOSKELETAL:  No kyphosis noted.  NEUROPSYCHIATRIC:  The patient alert and oriented x3.  No nystagmus  noted.   IMPRESSION:  Recurrent episodes of syncope, still potentially  neurocardiogenic, although more recent events are described in a  different fashion and are associated with some visual changes.  She  denies any frank sense of palpitations.  She has also been diagnosed  with tinnitus and has a definite component of vertigo with her symptoms.  I have recommended that she cut her triamterene hydrochlorothiazide in  half and will also place a 30-day event recorder.  I have also taken the  liberty of ordering a cranial MRI to exclude any other obvious  neurologic process.  A BMET will be ordered.  Carotid Dopplers will be  arranged as well.  I will have her follow up in the office with me to  review these results and can proceed  from there.  No new medications  were started today.     Satira Sark, MD  Electronically Signed    SGM/MedQ  DD: 10/15/2007  DT: 10/16/2007  Job #: [id number]   cc:   Andres Shad

## 2011-05-08 NOTE — Assessment & Plan Note (Signed)
[place] OFFICE NOTE   NAME:[name], [name] [name]                      MRN:          [id number]  DATE:09/23/2008                            DOB:          [birth year]    PRIMARY CARE PHYSICIAN:  Dr. Andres Shad.   REASON FOR VISIT:  Routine followup.   HISTORY OF PRESENT ILLNESS:  I saw [name] [name] back in April.  She  continues to do well without any significant dizziness or syncope on low-  dose beta-blocker therapy with a working diagnosis of neurocardiogenic  syncope.  She is not reporting any exertional chest pain or dyspnea.  She is reporting reflux and some lower midsternal chest discomfort.  It  does remind her somewhat of her previously diagnosed ulcers.  She is  on Prilosec already.  Electrocardiogram today shows sinus rhythm with  nonspecific lateral ST-T wave changes, which have been noted previously.  She is due for followup liver function and lipids.   ALLERGIES:  TRAMADOL.   PRESENT MEDICATIONS:  1. Triamterene and hydrochlorothiazide 75/50 mg p.o. daily.  2. Klor-Con 10 mEq p.o. daily.  3. Prilosec 40 mg p.o. daily.  4. Lasix 20 mg p.o. daily.  5. Diovan HCT 160/25 one-half tablet p.o. every other day.  6. Simvastatin 40 mg p.o. daily.  7. Meclizine 20 mg of one-half tablet p.o. t.i.d.  8. Toprol-XL 25 mg p.o. daily.  9. P.r.n. Aleve.  10.Colchicine.   REVIEW OF SYSTEMS:  As described in the history of present illness.  Otherwise negative.   PHYSICAL EXAMINATION:  VITAL SIGNS:  Blood pressure today is 120/70,  heart rate 64, and weight is 193 pounds.  GENERAL:  The patient is comfortable, and in no acute distress.  HEENT:  Conjunctivae are normal.  Oropharynx is clear.  NECK:  Supple.  No elevated jugular venous pressure.  No loud bruits.  LUNGS:  Clear.  No labored breathing at rest.  CARDIAC:  Regular rate and rhythm.  No S3 gallop or pericardial rub.  EXTREMITIES:  Exhibit  no significant pitting edema.   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  Otherwise, no exertional symptoms are reported.  An electrocardiogram is  stable.  I will plan a followup lipid profile, liver function tests, and  otherwise plan to see her back over the next 6 months.  I encouraged her  to meet  with Dr. Teryl Lucy if she continues to have progressive reflux symptoms.  She reports an endoscopy approximately 1 year ago.     Satira Sark, MD  Electronically Signed    SGM/MedQ  DD: 09/23/2008  DT: 09/24/2008  Job #: [id number]   cc:   Andres Shad

## 2011-05-08 NOTE — Assessment & Plan Note (Signed)
[place] [facility] OFFICE NOTE   NAME:[name] [name], [name] [name]                      MRN:          [id number]  DATE:10/27/2007                            DOB:          [birth year]    FOLLOWUP VISIT:   PRIMARY CARE PHYSICIAN:  Dr. Andres Shad.   REASON FOR VISIT:  Followup syncope.   HISTORY OF PRESENT ILLNESS:  I saw [name] [name] back in late October as  detailed in the previous note.  [name] [name] had had some recurrent episodes of  syncope and we had discussed the possibility of neurocardiogenic syncope  in the past.  I referred her for a basic neurological assessment and [name] [name]  underwent carotid Dopplers demonstrating only mild bilateral  atherosclerotic disease, no greater than 39% and likely not symptom  provoking.  [name] [name] had a cranial MRI and MRA which showed no obvious acute  processes.  There was no evidence of cortical or large vessel disease.  No sign of mass, hemorrhage, hydrocephalus, or extra axial fluid  collection.  [name] [name] does continue to wear an event recorder.  We have not  received any abnormal tracings as yet.  The patient does state that [name] [name]  had a brief episode this Monday, but did not activate the device  correctly.  We did note significant hypokalemia with a potassium of 2.7,  which was corrected with additional potassium supplementation.  [name] [name]  states that [name] [name] had her followup lab work done earlier in the week and  that her potassium was now normal.  [name] [name] was not able to tolerate a  decreased dose of her triamterene/hydrochlorothiazide due to worsening  lower extremity edema.   ALLERGIES:  No known drug allergies.   MEDICATIONS:  1. Tramadol 50 mg p.o. p.r.n.  2. Triamterene/hydrochlorothiazide 75/50 mg p.o. daily.  3. Klor-Con 10 mEq p.o. daily.  4. Colchicine 0.6 mg p.o. daily.  5. Prilosec 40 mg p.o. daily.  6. Lasix 20 mg p.o. daily.  7. Diovan 160/25 mg, one half tablet p.o. every other day.  8.  Meclizine 25 mg p.o. q.6 h. p.r.n.  9. Simvastatin 40 mg p.o. daily.   REVIEW OF SYSTEMS:  As described in the history of present illness.   PHYSICAL EXAMINATION:  VITAL SIGNS:  Blood pressure 118/76, heart rate  69, weight is 186 pounds.  GENERAL:  The patient is comfortable in no acute distress.  NECK:  Reveals no elevated jugular venous pressure .  LUNGS:  Clear, non-labored breathing.  CARDIAC:  Reveals a regular rate and rhythm.  No S3 gallop.  EXTREMITIES:  Show no pitting edema.   IMPRESSION/RECOMMENDATION:  Recurrent episodes of dizziness and syncope,  likely neurocardiogenic based on evaluation at this point.  Recent  cranial MRI and carotid Dopplers were overall reassuring.  I have  recommended that [name] [name] continue to wear the event recorder.  And if [name] [name]  has any additional episodes to try to capture some of these via  telemetry.  I will plan to see her back in 1 month's time to review the  results with her.  We might  consider a beta-blocker at that time if  nothing else surfaces.   Further plans to follow.     Satira Sark, MD  Electronically Signed    SGM/MedQ  DD: 10/27/2007  DT: 10/27/2007  Job #: [id number]   cc:   Andres Shad

## 2011-05-08 NOTE — Assessment & Plan Note (Signed)
[facility] OFFICE NOTE   NAME:[name] [name]                      MRN:          [id number]  DATE:05/26/2007                            DOB:          January 29, 1946    REASON FOR CONSULTATION:  Syncope.   HISTORY OF PRESENT ILLNESS:  [name] is a pleasant 65 year old woman  with a history of hypertension, gastroesophageal reflux, and recent  episode of syncope on Mother's Day. She states that she has leg cramps  and was having a particularly severe episode of leg cramps that day. She  was ambulating with her sister and felt dizzy, in fact telling her  sister that she thought she would pass out. She sat down on a chair and  apparently did lose consciousness for a relatively short period of time  according to the patient (perhaps a few minutes). She did not actually  fall at that time. In speaking with her, she states that she has had  episodes of syncope throughout her life, although not frequently.  Typically, these are associated with episodes of pain and she always has  the sense that she is going to pass out before it occurs. She otherwise  has no sense of exertional chest pain or dyspnea and no palpitations. I  do note that she is on diuretics as outlined below. She states that if  she stops her diuretics, she has lower extremity edema.   Her electrocardiogram today shows sinus rhythm with somewhat decreased R-  wave progression, but no frank anterior Q-waves, nonspecific ST changes  otherwise noted. There is no old tracing for comparison.   ALLERGIES:  No known drug allergies.   PRESENT MEDICATIONS:  1. Prilosec 40 mg p.o. daily.  2. Diovan hydrochlorothiazide 160/25 mg one-half tablet p.o. daily.  3. Lasix 20 mg p.o. daily.  4. Triamterene hydrochlorothiazide 75/50 mg p.o. daily.  5. Tramadol 50 mg p.o. p.r.n.  6. Potassium chloride 10 mEq p.o. daily.  7. Colchicine 0.6 mg as directed.  8. Naprosyn  375 mg p.o. b.i.d.   PAST MEDICAL HISTORY:  Is as outlined above. The patient is also status  post hysterectomy and removal of a lipoma from her leg.   SOCIAL HISTORY:  The patient is single. She has no children. She is  retired. She has no history of alcohol use. She quit smoking in 1998.  Drinks one cup of coffee a day.   FAMILY HISTORY:  Significant for stroke in the patient's mother who died  at age 61 of Alzheimer's dementia and the patient's father who died at  age 52.   REVIEW OF SYSTEMS:  As described in the History of Present Illness. She  has trouble with seasonal allergies. Has a hiatal hernia. Occasional  arthritic pain. Also, gouty arthritis.   PHYSICAL EXAMINATION:  Blood pressure 101/68, heart rate 75, weight is  179 pounds. The patient is comfortable and in no acute distress. HEENT:  Conjunctivae is normal. Oropharynx is clear.  NECK: Supple, without elevated jugular pressure, without bruits. No  thyromegaly is noted.  LUNGS:  Are clear  without labored breathing at rest.  CARDIAC: Reveals a regular rate and rhythm with soft basilar systolic  murmur, preserved S2, no radiation.  No S3 gallop or pericardial rub.  ABDOMEN: Soft and nontender. No hepatomegaly. No bruits.  EXTREMITIES: Exhibit no significant degree of pitting edema.  SKIN: Is warm and dry.  MUSCULOSKELETAL: No kyphosis noted.  NEURO/PSYCHIATRIC: The patient is alert and oriented x3. Affect is  normal.   IMPRESSIONS AND RECOMMENDATIONS:  1. Episodic, generally very infrequent episodes of syncope, that by      description are consistent with neurocardiogenic syncope. The      patient's trigger seems to be pain and her most recent episode      occurred in the setting of significant leg cramps. I note that the      patient is on a fairly high dose of diuretic at this time and this      could exacerbate neurocardiogenic syncope if it leads to relative      volume depletion. May also be related to her leg  cramps if she has      been hypokalemic. In any event, I have asked her to try to cut back      on this under the direction of Dr.  Teryl Lucy. I doubt that any      additional new medications are needed at this time. She states that      her last episode of syncope before this Mother's Day was      approximately 15 years ago. Otherwise, her electrocardiogram is      nonspecific at rest and we will plan a 2D echocardiogram to ensure      she has no major wall motion abnormalities and has overall      preserved left ventricular systolic function. Assuming that this is      the case, she can be followed expectantly and we can see her back      as needed.  2. We will inform Ms. Jump of her echocardiogram results by phone and      she can otherwise continue to followup with Dr.  Teryl Lucy.     Satira Sark, MD  Electronically Signed    SGM/MedQ  DD: 05/26/2007  DT: 05/26/2007  Job #: [id number]   cc:   Andres Shad

## 2012-02-29 ENCOUNTER — Other Ambulatory Visit: Payer: Self-pay | Admitting: *Deleted

## 2012-02-29 MED ORDER — METOPROLOL SUCCINATE ER 25 MG PO TB24: 25.0000 mg | ORAL_TABLET | Freq: Every day | ORAL | Status: DC

## 2012-04-28 ENCOUNTER — Encounter: Payer: Self-pay | Admitting: Cardiology

## 2012-05-01 ENCOUNTER — Ambulatory Visit: Payer: Medicare PPO | Admitting: Cardiology

## 2012-05-01 ENCOUNTER — Ambulatory Visit (INDEPENDENT_AMBULATORY_CARE_PROVIDER_SITE_OTHER): Payer: Medicare PPO | Admitting: Cardiology

## 2012-05-01 ENCOUNTER — Encounter: Payer: Self-pay | Admitting: Cardiology

## 2012-05-01 VITALS — BP 136/86 | HR 71 | Resp 16 | Ht 62.0 in | Wt 185.0 lb

## 2012-05-01 DIAGNOSIS — E876 Hypokalemia: Secondary | ICD-10-CM

## 2012-05-01 DIAGNOSIS — N289 Disorder of kidney and ureter, unspecified: Secondary | ICD-10-CM

## 2012-05-01 DIAGNOSIS — R55 Syncope and collapse: Secondary | ICD-10-CM

## 2012-05-01 DIAGNOSIS — I10 Essential (primary) hypertension: Secondary | ICD-10-CM

## 2012-05-01 NOTE — Assessment & Plan Note (Signed)
No progression in symptoms. Continue observation for now on beta blocker therapy.

## 2012-05-01 NOTE — Assessment & Plan Note (Signed)
States that she has consultation with nephrology pending.

## 2012-05-01 NOTE — Patient Instructions (Signed)
Your physician recommends that you schedule a follow-up appointment in: 1 year  Your physician recommends that you return for lab work in: within the week

## 2012-05-01 NOTE — Progress Notes (Signed)
   Clinical Summary Ms. Abrew is a 66 y.o.female presenting for followup. She was seen in May 2012. She denies any progressive syncopal events. No definite palpitations. Reports compliance with her medications. Does state that she has had recent lab work demonstrating both renal insufficiency and hypokalemia that has been quite difficult to correct per her report. Potassium was increased to 20 mEq once daily which she has been on for the last month. She also states that she is due to see a nephrologist for consultation. Reports no major difficulty with blood pressure control.   Allergies  Allergen Reactions  . Acetaminophen-Codeine   . Allopurinol   . Tramadol     Current Outpatient Prescriptions  Medication Sig Dispense Refill  . colchicine 0.6 MG tablet Take 0.6 mg by mouth daily. As directed         . furosemide (LASIX) 20 MG tablet Take 20 mg by mouth daily.        . meclizine (ANTIVERT) 25 MG tablet Take 25 mg by mouth 3 (three) times daily as needed. Take One half  Tablet by mouth three times a day        . metoprolol succinate (TOPROL XL) 25 MG 24 hr tablet Take 1 tablet (25 mg total) by mouth daily.  90 tablet  1  . omeprazole (PRILOSEC) 40 MG capsule Take 40 mg by mouth daily.        . potassium chloride SA (KLOR-CON M10) 10 MEQ tablet Take 10 mEq by mouth 2 (two) times daily.       . simvastatin (ZOCOR) 40 MG tablet Take 40 mg by mouth daily.        Marland Kitchen triamterene-hydrochlorothiazide (MAXZIDE) 75-50 MG per tablet Take 1 tablet by mouth daily.      . valsartan (DIOVAN) 160 MG tablet Take 160 mg by mouth daily.        . Vitamin D, Ergocalciferol, (DRISDOL) 50000 UNITS CAPS Take 50,000 Units by mouth every 7 (seven) days.        Past Medical History  Diagnosis Date  . GERD (gastroesophageal reflux disease)   . Hyperlipidemia   . Hypertension   . PUD (peptic ulcer disease)   . Gout   . Syncope     Neurocardiogenic    Social History Ms. Landgren reports that she has quit  smoking. Her smoking use included Cigarettes. She has never used smokeless tobacco. Ms. Gulliford reports that she does not drink alcohol.  Review of Systems Fatigue. Stable appetite. No reported bleeding problems. No orthopnea. Otherwise negative.  Physical Examination Filed Vitals:   05/01/12 1503  BP: 136/86  Pulse: 71  Resp: 16   Overweight woman in no acute distress.  HEENT: Conjunctiva and lids normal, oropharynx clear.  Neck: Supple, no elevated JVP or bruits.  Lungs: Clear to auscultation, nonlabored.  Cardiac: Regular rate and rhythm, no S3.  Pharynx: No pitting edema.  Skin: Warm and dry.   ECG Reviewed showing sinus rhythm with old anterolateral ST depression and T-wave inversions.  Problem List and Plan   VASOVAGAL SYNCOPE No progression in symptoms. Continue observation for now on beta blocker therapy.  Hypokalemia Reports potassium as low as 2.8. Supplement has been increased over the last month. Followup BMET and magnesium.  Renal insufficiency States that she has consultation with nephrology pending.     Satira Sark, M.D., F.A.C.C.

## 2012-05-01 NOTE — Assessment & Plan Note (Signed)
Reports potassium as low as 2.8. Supplement has been increased over the last month. Followup BMET and magnesium.

## 2012-05-07 LAB — BASIC METABOLIC PANEL
CO2: 38 mEq/L — ABNORMAL HIGH (ref 19–32)
Calcium: 9.9 mg/dL (ref 8.4–10.5)
Creat: 1.17 mg/dL — ABNORMAL HIGH (ref 0.50–1.10)
Potassium: 3.4 mEq/L — ABNORMAL LOW (ref 3.5–5.3)

## 2012-07-17 ENCOUNTER — Other Ambulatory Visit: Payer: Self-pay | Admitting: Cardiology

## 2012-12-11 ENCOUNTER — Other Ambulatory Visit: Payer: Self-pay | Admitting: Cardiology

## 2012-12-11 MED ORDER — METOPROLOL SUCCINATE ER 25 MG PO TB24: 25.0000 mg | ORAL_TABLET | Freq: Every day | ORAL | Status: DC

## 2012-12-12 ENCOUNTER — Other Ambulatory Visit: Payer: Self-pay | Admitting: Cardiology

## 2012-12-12 MED ORDER — METOPROLOL SUCCINATE ER 25 MG PO TB24: 25.0000 mg | ORAL_TABLET | Freq: Every day | ORAL | Status: DC

## 2013-05-19 ENCOUNTER — Other Ambulatory Visit: Payer: Self-pay | Admitting: *Deleted

## 2013-05-19 MED ORDER — METOPROLOL SUCCINATE ER 25 MG PO TB24: 25.0000 mg | ORAL_TABLET | Freq: Every day | ORAL | Status: DC

## 2013-05-22 ENCOUNTER — Encounter: Payer: Self-pay | Admitting: Cardiology

## 2013-05-22 ENCOUNTER — Ambulatory Visit (INDEPENDENT_AMBULATORY_CARE_PROVIDER_SITE_OTHER): Payer: Medicare PPO | Admitting: Cardiology

## 2013-05-22 VITALS — BP 130/60 | HR 74 | Ht 62.0 in | Wt 196.0 lb

## 2013-05-22 DIAGNOSIS — E876 Hypokalemia: Secondary | ICD-10-CM

## 2013-05-22 DIAGNOSIS — I10 Essential (primary) hypertension: Secondary | ICD-10-CM

## 2013-05-22 DIAGNOSIS — R55 Syncope and collapse: Secondary | ICD-10-CM

## 2013-05-22 NOTE — Progress Notes (Signed)
   Clinical Summary [name] is a 67 y.o.female last seen in May 2013. She denies any recurring problems with syncope since I last saw her. Does state that she was diagnosed with mild asthma - has not had to come off of her beta blocker.  ECG today shows sinus rhythm with chronic repolarization abnormalities. She has had no chest pain with exertion.  Allergies  Allergen Reactions  . Acetaminophen-Codeine   . Allopurinol   . Tramadol     Current Outpatient Prescriptions  Medication Sig Dispense Refill  . colchicine 0.6 MG tablet Take 0.6 mg by mouth daily. As directed         . Fluticasone-Salmeterol (ADVAIR) 500-50 MCG/DOSE AEPB Inhale 1 puff into the lungs every 12 (twelve) hours.      . furosemide (LASIX) 20 MG tablet Take 20 mg by mouth daily.        . meclizine (ANTIVERT) 25 MG tablet Take 25 mg by mouth 3 (three) times daily as needed. Take One half  Tablet by mouth three times a day        . metoprolol succinate (TOPROL-XL) 25 MG 24 hr tablet Take 1 tablet (25 mg total) by mouth daily.  90 tablet  1  . omeprazole (PRILOSEC) 40 MG capsule Take 40 mg by mouth daily.        . potassium chloride SA (KLOR-CON M10) 10 MEQ tablet Take 10 mEq by mouth 2 (two) times daily.       . simvastatin (ZOCOR) 40 MG tablet Take 40 mg by mouth daily.        Marland Kitchen triamterene-hydrochlorothiazide (MAXZIDE) 75-50 MG per tablet Take 1 tablet by mouth daily.      . valsartan (DIOVAN) 160 MG tablet Take 160 mg by mouth daily.        . Vitamin D, Ergocalciferol, (DRISDOL) 50000 UNITS CAPS Take 50,000 Units by mouth every 7 (seven) days.       No current facility-administered medications for this visit.    Past Medical History  Diagnosis Date  . GERD (gastroesophageal reflux disease)   . Hyperlipidemia   . Hypertension   . PUD (peptic ulcer disease)   . Gout   . Syncope     Neurocardiogenic    Social History [name] reports that she has quit smoking. Her smoking use included Cigarettes. She  smoked 0.00 packs per day. She has never used smokeless tobacco. [name] [name] reports that she does not drink alcohol.  Review of Systems Trouble with allergies this year. Has not been as active. Otherwise negative.  Physical Examination Filed Vitals:   05/22/13 1302  BP: 130/60  Pulse: 74   Filed Weights   05/22/13 1302  Weight: 196 lb (88.905 kg)    Comfortable at rest  HEENT: Conjunctiva and lids normal, oropharynx clear.  Neck: Supple, no elevated JVP or bruits.  Lungs: Clear to auscultation, nonlabored.  Cardiac: Regular rate and rhythm, no S3.  Pharynx: No pitting edema.  Skin: Warm and dry.   Problem List and Plan   VASOVAGAL SYNCOPE No recurrences. Continue beta blocker and observation. ECG stable.  Essential hypertension, benign Reasonable control today. No changes made.    Satira Sark, M.D., F.A.C.C.

## 2013-05-22 NOTE — Assessment & Plan Note (Signed)
Reasonable control today. No changes made.

## 2013-05-22 NOTE — Patient Instructions (Addendum)
Your physician recommends that you schedule a follow-up appointment in: ONE YEAR 

## 2013-05-22 NOTE — Assessment & Plan Note (Signed)
No recurrences. Continue beta blocker and observation. ECG stable.

## 2013-12-09 ENCOUNTER — Encounter: Payer: Self-pay | Admitting: *Deleted

## 2013-12-09 ENCOUNTER — Ambulatory Visit (INDEPENDENT_AMBULATORY_CARE_PROVIDER_SITE_OTHER): Payer: Medicare PPO | Admitting: Physician Assistant

## 2013-12-09 ENCOUNTER — Encounter: Payer: Self-pay | Admitting: Physician Assistant

## 2013-12-09 VITALS — BP 155/71 | HR 64 | Ht 63.0 in | Wt 197.0 lb

## 2013-12-09 DIAGNOSIS — I10 Essential (primary) hypertension: Secondary | ICD-10-CM

## 2013-12-09 DIAGNOSIS — R079 Chest pain, unspecified: Secondary | ICD-10-CM | POA: Insufficient documentation

## 2013-12-09 DIAGNOSIS — R55 Syncope and collapse: Secondary | ICD-10-CM

## 2013-12-09 NOTE — Assessment & Plan Note (Signed)
Patient had one episode of feeling "tired" in her chest. This sensation lasted about 2 hours before she awakened pain-free. She has multiple risk factors for cardiac disease and has an abnormal EKG. Because of this I will order a stress echo and have her followup with Dr. Domenic Polite. She is advised to go to the emergency room if it recurs in the interim.

## 2013-12-09 NOTE — Assessment & Plan Note (Signed)
Blood pressure is up a low today but has been stable as an outpatient

## 2013-12-09 NOTE — Assessment & Plan Note (Signed)
No recent problems with vasovagal syncope

## 2013-12-09 NOTE — Progress Notes (Signed)
HPI:  This is a 67 year old female patient of Dr. Domenic Polite who has a history of vasovagal syncope, renal insufficiency, and hypertension.  Patient comes in today complaining of a "tired" feeling in her chest that occurred Friday night while eating salted herring and cornbread. She said she laid down and fell asleep in 2 hours later when she awakened the feeling was gone. She has a hard time describing the sensation other than a tired feeling. She's not sure that it was a pain or tightness. She denies any sharp shooting pain radiation into her neck or arm, dyspnea, diaphoresis, nausea, reflux symptoms, vomiting, dizziness or presyncope. She is very active working in her garden and doing housework without symptoms. She does not exercise outside of these activities. She's never had this sensation before and hasn't had it since. She decided to go to the urgent care on Monday to get checked out but they couldn't diagnose her based on her vague symptoms. They did not do an EKG.   The patient quit smoking in 1998 and has been diagnosed with asthma versus COPD. 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 She also has hypertension and hyperlipidemia.  Allergies -- Acetaminophen-Codeine   -- Allopurinol   -- Tramadol   Current Outpatient Prescriptions on File Prior to Visit: colchicine 0.6 MG tablet, Take 0.6 mg by mouth daily. As directed, Disp: , Rfl:  Fluticasone-Salmeterol (ADVAIR) 500-50 MCG/DOSE AEPB, Inhale 1 puff into the lungs every 12 (twelve) hours., Disp: , Rfl:  furosemide (LASIX) 20 MG tablet, Take 20 mg by mouth daily.  , Disp: , Rfl:   meclizine (ANTIVERT) 25 MG tablet, Take 25 mg by mouth 3 (three) times daily as needed. Take One half  Tablet by mouth three times a day , Disp: , Rfl:  metoprolol succinate (TOPROL-XL) 25 MG 24 hr tablet, Take 1 tablet (25 mg total) by mouth daily., Disp: 90 tablet, Rfl: 1 omeprazole (PRILOSEC) 40 MG  capsule, Take 40 mg by mouth daily.  , Disp: , Rfl:  simvastatin (ZOCOR) 40 MG tablet, Take 40 mg by mouth daily.  , Disp: , Rfl:  triamterene-hydrochlorothiazide (MAXZIDE) 75-50 MG per tablet, Take 1 tablet by mouth daily., Disp: , Rfl:  valsartan (DIOVAN) 160 MG tablet, Take 160 mg by mouth daily.  , Disp: , Rfl:  Vitamin D, Ergocalciferol, (DRISDOL) 50000 UNITS CAPS, Take 50,000 Units by mouth every 7 (seven) days., Disp: , Rfl:   No current facility-administered medications on file prior to visit.   Past Medical History:   GERD (gastroesophageal reflux disease)                       Hyperlipidemia                                               Hypertension                                                 PUD (peptic ulcer disease)  Gout                                                         Syncope                                                        Comment:Neurocardiogenic  Past Surgical History:   ABDOMINAL HYSTERECTOMY                                        LIPOMA RESECTION                                             Review of patient's family history indicates:   Stroke                         Mother                   Dementia                       Father                   Social History   Marital Status: Single              Spouse Name:                      Years of Education:                 Number of children:             Occupational History Occupation          Fish farm manager            Comment              Retired                                   Social History Main Topics   Smoking Status: Former Smoker                   Packs/Day: 0.00  Years:           Types: Cigarettes   Smokeless Status: Never Used                       Alcohol Use: No             Drug Use: No             Sexual Activity: Not on file        Other Topics            Concern   None on file  Social History Narrative   None on file    ROS: See history of  present illness otherwise negative   PHYSICAL EXAM: Well-nournished, in no acute distress. Neck: No JVD, HJR, Bruit, or thyroid enlargement  Lungs: No tachypnea, clear without wheezing, rales, or rhonchi  Cardiovascular: RRR, PMI not displaced, heart sounds normal, positive S4, no murmur, bruit, thrill, or heave.  Abdomen: BS normal. Soft without organomegaly, masses, lesions or tenderness.  Extremities: without cyanosis, clubbing or edema. Good distal pulses bilateral  SKin: Warm, no lesions or rashes   Musculoskeletal: No deformities  Neuro: no focal signs  BP 155/71  Pulse 64  Ht 5\' 3"  (1.6 m)  Wt 197 lb (89.359 kg)  BMI 34.91 kg/m2   EKG: Normal sinus rhythm with ST and T wave inversion inferior lateral. These EKG changes are unchanged from prior readings in May 2014

## 2013-12-09 NOTE — Patient Instructions (Addendum)
Your physician recommends that you schedule a follow-up appointment in: 1 month with Dr Domenic Polite  Your physician has requested that you have a stress echocardiogram. For further information please visit [email or web address]. Please follow instruction sheet as given.  DO not take Metoprolol day of test

## 2013-12-28 ENCOUNTER — Ambulatory Visit (HOSPITAL_COMMUNITY)
Admission: RE | Admit: 2013-12-28 | Discharge: 2013-12-28 | Disposition: A | Discharge status: Home / Self Care | Payer: Medicare PPO | Source: Ambulatory Visit | Attending: Cardiology | Admitting: Cardiology

## 2013-12-28 ENCOUNTER — Encounter (HOSPITAL_COMMUNITY): Payer: Self-pay

## 2013-12-28 DIAGNOSIS — R079 Chest pain, unspecified: Secondary | ICD-10-CM

## 2013-12-28 DIAGNOSIS — E785 Hyperlipidemia, unspecified: Secondary | ICD-10-CM | POA: Insufficient documentation

## 2013-12-28 DIAGNOSIS — N289 Disorder of kidney and ureter, unspecified: Secondary | ICD-10-CM | POA: Insufficient documentation

## 2013-12-28 DIAGNOSIS — Z6835 Body mass index (BMI) 35.0-35.9, adult: Secondary | ICD-10-CM | POA: Insufficient documentation

## 2013-12-28 DIAGNOSIS — R072 Precordial pain: Secondary | ICD-10-CM

## 2013-12-28 DIAGNOSIS — I10 Essential (primary) hypertension: Secondary | ICD-10-CM | POA: Insufficient documentation

## 2013-12-28 NOTE — Progress Notes (Signed)
*  PRELIMINARY RESULTS* Echocardiogram Echocardiogram Stress Test has been performed.  [place], [place] 12/28/2013, 11:37 AM

## 2013-12-28 NOTE — Progress Notes (Signed)
Stress Lab Nurses Notes - [place] 12/28/2013 Reason for doing test: Chest Pain Type of test: Stress Echo Nurse performing test: Gerrit Halls, RN Nuclear Medicine Tech: Not Applicable Echo Tech: Jamison Neighbor MD performing test: Dr.Branch Family MD: Dr. Ledell Peoples Test explained and consent signed: yes IV started: No IV started Symptoms: SOB Treatment/Intervention: None Reason test stopped: reached target HR After recovery IV was: NA Patient to return to [place]. Med at : NA Patient discharged: Home Patient's Condition upon discharge was: stable Comments: During test peak BP 213/55 & HR 142.  Recovery BP 152/83 & HR 90.  Symptoms resolved in recovery. Geanie Cooley T

## 2014-01-24 NOTE — Progress Notes (Signed)
Clinical Summary [name] [name] is a 68 y.o.female last seen in December 2014 by [name] [name]. She was referred for stress testing at that time.  Stress echocardiogram showed equivocal ST changes, hypertensiveno diagnos response, no diagnostic wall motion abnormalities to indicate ischemia.  We reviewed the results of her stress test today. She reports no symptoms of chest pain or shortness of breath. For now I recommended observation on medical therapy.   Allergies  Allergen Reactions  . Acetaminophen-Codeine   . Allopurinol   . Tramadol     Current Outpatient Prescriptions  Medication Sig Dispense Refill  . colchicine 0.6 MG tablet Take 0.6 mg by mouth daily. As directed      . Fluticasone-Salmeterol (ADVAIR) 500-50 MCG/DOSE AEPB Inhale 1 puff into the lungs every 12 (twelve) hours.      . furosemide (LASIX) 20 MG tablet Take 20 mg by mouth daily.        . meclizine (ANTIVERT) 25 MG tablet Take 25 mg by mouth 3 (three) times daily as needed. Take One half  Tablet by mouth three times a day        . metoprolol succinate (TOPROL-XL) 25 MG 24 hr tablet Take 1 tablet (25 mg total) by mouth daily.  90 tablet  1  . Naproxen Sodium 220 MG CAPS       . Neomycin-Bacitracin-Polymyxin (HCA TRIPLE ANTIBIOTIC OINTMENT EX)       . omeprazole (PRILOSEC) 40 MG capsule Take 40 mg by mouth daily.        . potassium chloride (K-DUR) 10 MEQ tablet Take 20 mEq by mouth daily.       . simvastatin (ZOCOR) 40 MG tablet Take 40 mg by mouth daily.        . STOOL SOFTENER & LAXATIVE 8.6-50 MG per tablet       . triamterene-hydrochlorothiazide (MAXZIDE) 75-50 MG per tablet Take 1 tablet by mouth daily.      . valsartan (DIOVAN) 160 MG tablet Take 160 mg by mouth daily.        . Vitamin D, Ergocalciferol, (DRISDOL) 50000 UNITS CAPS Take 50,000 Units by mouth every 7 (seven) days.       No current facility-administered medications for this visit.    Past Medical History  Diagnosis Date  . GERD  (gastroesophageal reflux disease)   . Hyperlipidemia   . Hypertension   . PUD (peptic ulcer disease)   . Gout   . Syncope     Neurocardiogenic    Social History [name] [name] reports that she has quit smoking. Her smoking use included Cigarettes. She smoked 0.00 packs per day. She has never used smokeless tobacco. [name] [name] reports that she does not drink alcohol.  Review of Systems No syncope, no palpitations. Otherwise negative.  Physical Examination Filed Vitals:   01/25/14 1412  BP: 148/67  Pulse: 72   Filed Weights   01/25/14 1412  Weight: 191 lb 8 oz (86.864 kg)    Comfortable at rest  HEENT: Conjunctiva and lids normal, oropharynx clear.  Neck: Supple, no elevated JVP or bruits.  Lungs: Clear to auscultation, nonlabored.  Cardiac: Regular rate and rhythm, no S3.  Pharynx: No pitting edema.  Skin: Warm and dry.   Problem List and Plan   Chest pain This has resolved. Stress echocardiogram was low risk, no ischemia by echocardiographic imaging. Continue observation on medical therapy.  VASOVAGAL SYNCOPE Quiescent at this time.    Satira Sark, M.D., F.A.C.C.

## 2014-01-25 ENCOUNTER — Ambulatory Visit (INDEPENDENT_AMBULATORY_CARE_PROVIDER_SITE_OTHER): Payer: Medicare PPO | Admitting: Cardiology

## 2014-01-25 ENCOUNTER — Encounter: Payer: Self-pay | Admitting: Cardiology

## 2014-01-25 ENCOUNTER — Encounter (INDEPENDENT_AMBULATORY_CARE_PROVIDER_SITE_OTHER): Payer: Self-pay

## 2014-01-25 VITALS — BP 148/67 | HR 72 | Ht 62.5 in | Wt 191.5 lb

## 2014-01-25 DIAGNOSIS — R079 Chest pain, unspecified: Secondary | ICD-10-CM

## 2014-01-25 DIAGNOSIS — R55 Syncope and collapse: Secondary | ICD-10-CM

## 2014-01-25 NOTE — Assessment & Plan Note (Signed)
Quiescent at this time

## 2014-01-25 NOTE — Assessment & Plan Note (Signed)
This has resolved. Stress echocardiogram was low risk, no ischemia by echocardiographic imaging. Continue observation on medical therapy.

## 2014-01-25 NOTE — Patient Instructions (Addendum)
Your physician recommends that you schedule a follow-up appointment in: 6 months   Your physician recommends that you continue on your current medications as directed. Please refer to the Current Medication list given to you today.   Thanks for choosing [facility] !

## 2014-08-16 ENCOUNTER — Encounter (INDEPENDENT_AMBULATORY_CARE_PROVIDER_SITE_OTHER): Payer: Self-pay

## 2014-08-16 ENCOUNTER — Ambulatory Visit (INDEPENDENT_AMBULATORY_CARE_PROVIDER_SITE_OTHER): Payer: Medicare PPO | Admitting: Cardiology

## 2014-08-16 ENCOUNTER — Encounter: Payer: Self-pay | Admitting: Cardiology

## 2014-08-16 VITALS — BP 142/88 | HR 61 | Ht 62.0 in | Wt 184.0 lb

## 2014-08-16 DIAGNOSIS — R072 Precordial pain: Secondary | ICD-10-CM

## 2014-08-16 DIAGNOSIS — R55 Syncope and collapse: Secondary | ICD-10-CM

## 2014-08-16 DIAGNOSIS — I10 Essential (primary) hypertension: Secondary | ICD-10-CM

## 2014-08-16 NOTE — Patient Instructions (Signed)
Your physician wants you to follow-up in: 6 months You will receive a reminder letter in the mail two months in advance. If you don't receive a letter, please call our office to schedule the follow-up appointment.     Your physician recommends that you continue on your current medications as directed. Please refer to the Current Medication list given to you today.      Thank you for choosing [facility] !        

## 2014-08-16 NOTE — Assessment & Plan Note (Signed)
No recurrences. Continue observation.

## 2014-08-16 NOTE — Assessment & Plan Note (Signed)
This has resolved. Ischemic workup via stress echocardiogram from January of this year was negative for ischemia.

## 2014-08-16 NOTE — Assessment & Plan Note (Signed)
Blood pressure is mildly elevated today. Keep followup with primary care provider for pending physical in September.

## 2014-08-16 NOTE — Progress Notes (Signed)
Clinical Summary Ms. Pogosyan is a 68 y.o.female last seen in February. She reports no chest pain symptoms, has had some breathing difficulties with the high summer heat. No dizziness or syncope. Medications are reviewed below.  Stress echocardiogram showed equivocal ST changes, hypertensiveno diagnos response, no diagnostic wall motion abnormalities to indicate ischemia.   She states that she will have a physical with her primary care provider in September.   Allergies  Allergen Reactions  . Acetaminophen-Codeine   . Allopurinol   . Tramadol     Current Outpatient Prescriptions  Medication Sig Dispense Refill  . colchicine 0.6 MG tablet Take 0.6 mg by mouth daily. As directed      . Fluticasone-Salmeterol (ADVAIR) 500-50 MCG/DOSE AEPB Inhale 1 puff into the lungs every 12 (twelve) hours.      . furosemide (LASIX) 20 MG tablet Take 20 mg by mouth daily.        . meclizine (ANTIVERT) 25 MG tablet Take 25 mg by mouth 3 (three) times daily as needed. Take One half  Tablet by mouth three times a day        . metoprolol succinate (TOPROL-XL) 25 MG 24 hr tablet Take 1 tablet (25 mg total) by mouth daily.  90 tablet  1  . Naproxen Sodium 220 MG CAPS       . Neomycin-Bacitracin-Polymyxin (HCA TRIPLE ANTIBIOTIC OINTMENT EX)       . omeprazole (PRILOSEC) 40 MG capsule Take 40 mg by mouth daily.        . potassium chloride (K-DUR) 10 MEQ tablet Take 20 mEq by mouth 2 (two) times daily.       . simvastatin (ZOCOR) 40 MG tablet Take 40 mg by mouth daily.        . STOOL SOFTENER & LAXATIVE 8.6-50 MG per tablet       . triamterene-hydrochlorothiazide (MAXZIDE) 75-50 MG per tablet Take 1 tablet by mouth daily.      . valsartan (DIOVAN) 160 MG tablet Take 160 mg by mouth daily.         No current facility-administered medications for this visit.    Past Medical History  Diagnosis Date  . GERD (gastroesophageal reflux disease)   . Hyperlipidemia   . Hypertension   . PUD (peptic ulcer  disease)   . Gout   . Syncope     Neurocardiogenic    Social History Ms. Silvernail reports that she has quit smoking. Her smoking use included Cigarettes. She smoked 0.00 packs per day. She has never used smokeless tobacco. Ms. Stoakes reports that she does not drink alcohol.  Review of Systems No palpitations, dizziness, syncope. Stable appetite. No orthopnea or PND. Other systems reviewed and negative except as outlined.  Physical Examination Filed Vitals:   08/16/14 0819  BP: 142/88  Pulse: 61   Filed Weights   08/16/14 0819  Weight: 184 lb (83.462 kg)    Appears comfortable at rest  HEENT: Conjunctiva and lids normal, oropharynx clear.  Neck: Supple, no elevated JVP or bruits.  Lungs: Clear to auscultation, nonlabored.  Cardiac: Regular rate and rhythm, no S3.  Pharynx: No pitting edema.  Skin: Warm and dry.   Problem List and Plan   VASOVAGAL SYNCOPE No recurrences. Continue observation.  Chest pain This has resolved. Ischemic workup via stress echocardiogram from January of this year was negative for ischemia.  Essential hypertension, benign Blood pressure is mildly elevated today. Keep followup with primary care provider for pending physical in  September.    Satira Sark, M.D., F.A.C.C.

## 2014-09-13 ENCOUNTER — Encounter: Payer: Self-pay | Admitting: Gastroenterology

## 2014-09-21 ENCOUNTER — Telehealth: Payer: Self-pay

## 2014-09-21 NOTE — Telephone Encounter (Signed)
Pt called today to set up her colonoscopy. She said she is due for one and wanted to go somewhere closer to [place].  Please call [phone]

## 2014-09-23 ENCOUNTER — Encounter (HOSPITAL_COMMUNITY): Payer: Self-pay | Admitting: Pharmacy Technician

## 2014-09-23 ENCOUNTER — Other Ambulatory Visit: Payer: Self-pay

## 2014-09-23 DIAGNOSIS — Z1211 Encounter for screening for malignant neoplasm of colon: Secondary | ICD-10-CM

## 2014-09-23 NOTE — Telephone Encounter (Signed)
Gastroenterology Pre-Procedure Review  Request Date:09/22/2014 Requesting Physician: PT CALLED AND SAID HER LAST COLONOSCOPY WAS IN [place] IN 2005 AND NO POLYPS HER MATERNAL GRANDMOTHER HAD COLON CANCER AGE 68  PATIENT REVIEW QUESTIONS: The patient responded to the following health history questions as indicated:    1. Diabetes Melitis: no 2. Joint replacements in the past 12 months: no 3. Major health problems in the past 3 months: no 4. Has an artificial valve or MVP: no 5. Has a defibrillator: no 6. Has been advised in past to take antibiotics in advance of a procedure like teeth cleaning: no    MEDICATIONS & ALLERGIES:    Patient reports the following regarding taking any blood thinners:   Plavix? no Aspirin? no Coumadin? no  Patient confirms/reports the following medications:  Current Outpatient Prescriptions  Medication Sig Dispense Refill  . colchicine 0.6 MG tablet Take 0.6 mg by mouth daily. As directed      . Fluticasone-Salmeterol (ADVAIR) 500-50 MCG/DOSE AEPB Inhale 1 puff into the lungs every 12 (twelve) hours.      . furosemide (LASIX) 20 MG tablet Take 20 mg by mouth daily.        . meclizine (ANTIVERT) 25 MG tablet Take 25 mg by mouth 3 (three) times daily as needed. Take One half  Tablet by mouth three times a day        . metoprolol succinate (TOPROL-XL) 25 MG 24 hr tablet Take 1 tablet (25 mg total) by mouth daily.  90 tablet  1  . Naproxen Sodium 220 MG CAPS       . omeprazole (PRILOSEC) 40 MG capsule Take 40 mg by mouth daily.        . potassium chloride (K-DUR) 10 MEQ tablet Take 20 mEq by mouth 2 (two) times daily.       . simvastatin (ZOCOR) 40 MG tablet Take 40 mg by mouth daily.        Marland Kitchen triamterene-hydrochlorothiazide (MAXZIDE) 75-50 MG per tablet Take 1 tablet by mouth daily.      . valsartan (DIOVAN) 160 MG tablet Take 160 mg by mouth daily.        Marland Kitchen Neomycin-Bacitracin-Polymyxin (HCA TRIPLE ANTIBIOTIC OINTMENT EX)       . STOOL SOFTENER & LAXATIVE  8.6-50 MG per tablet        No current facility-administered medications for this visit.    Patient confirms/reports the following allergies:  Allergies  Allergen Reactions  . Acetaminophen-Codeine   . Allopurinol   . Tramadol     No orders of the defined types were placed in this encounter.    AUTHORIZATION INFORMATION Primary Insurance:   ID #:  Group #:  Pre-Cert / Auth required: Pre-Cert / Auth #:   Secondary Insurance:   ID #:  Group #:  Pre-Cert / Auth required Pre-Cert / Auth #:   SCHEDULE INFORMATION: Procedure has been scheduled as follows:  Date:  10/07/2014                  Time:  11:30 Location: [facility] Short Stay  This Gastroenterology Pre-Precedure Review Form is being routed to the following provider(s): Barney Drain, MD

## 2014-09-27 NOTE — Telephone Encounter (Signed)
REVIEWED.  MOVI PREP SPLIT DOSING, REGULAR BREAKFAST. CLEAR LIQUIDS AFTER 9 AM. Take 1/2 MAXZIDE on day before procedure and hold on day of procedure.

## 2014-09-28 MED ORDER — PEG-KCL-NACL-NASULF-NA ASC-C 100 G PO SOLR: 1.0000 | ORAL | Status: DC

## 2014-09-28 NOTE — Addendum Note (Signed)
Addended by: Everardo All on: 09/28/2014 02:17 PM   Modules accepted: Orders

## 2014-09-28 NOTE — Telephone Encounter (Signed)
Rx sent to the pharmacy and instructions mailed to pt.  

## 2014-10-07 ENCOUNTER — Encounter (HOSPITAL_COMMUNITY): Payer: Self-pay | Admitting: *Deleted

## 2014-10-07 ENCOUNTER — Ambulatory Visit (HOSPITAL_COMMUNITY)
Admission: RE | Admit: 2014-10-07 | Discharge: 2014-10-07 | Disposition: A | Discharge status: Home / Self Care | Payer: Medicare PPO | Source: Ambulatory Visit | Attending: Gastroenterology | Admitting: Gastroenterology

## 2014-10-07 ENCOUNTER — Encounter (HOSPITAL_COMMUNITY)
Admission: RE | Disposition: A | Discharge status: Home / Self Care | Payer: Self-pay | Source: Ambulatory Visit | Attending: Gastroenterology

## 2014-10-07 DIAGNOSIS — M109 Gout, unspecified: Secondary | ICD-10-CM | POA: Diagnosis not present

## 2014-10-07 DIAGNOSIS — Q438 Other specified congenital malformations of intestine: Secondary | ICD-10-CM | POA: Diagnosis not present

## 2014-10-07 DIAGNOSIS — Z885 Allergy status to narcotic agent status: Secondary | ICD-10-CM | POA: Insufficient documentation

## 2014-10-07 DIAGNOSIS — E785 Hyperlipidemia, unspecified: Secondary | ICD-10-CM | POA: Insufficient documentation

## 2014-10-07 DIAGNOSIS — Z79899 Other long term (current) drug therapy: Secondary | ICD-10-CM | POA: Diagnosis not present

## 2014-10-07 DIAGNOSIS — K219 Gastro-esophageal reflux disease without esophagitis: Secondary | ICD-10-CM | POA: Insufficient documentation

## 2014-10-07 DIAGNOSIS — K279 Peptic ulcer, site unspecified, unspecified as acute or chronic, without hemorrhage or perforation: Secondary | ICD-10-CM | POA: Insufficient documentation

## 2014-10-07 DIAGNOSIS — Z888 Allergy status to other drugs, medicaments and biological substances status: Secondary | ICD-10-CM | POA: Diagnosis not present

## 2014-10-07 DIAGNOSIS — Z1211 Encounter for screening for malignant neoplasm of colon: Secondary | ICD-10-CM | POA: Insufficient documentation

## 2014-10-07 DIAGNOSIS — I10 Essential (primary) hypertension: Secondary | ICD-10-CM | POA: Insufficient documentation

## 2014-10-07 DIAGNOSIS — K644 Residual hemorrhoidal skin tags: Secondary | ICD-10-CM | POA: Diagnosis not present

## 2014-10-07 DIAGNOSIS — R55 Syncope and collapse: Secondary | ICD-10-CM | POA: Diagnosis not present

## 2014-10-07 DIAGNOSIS — Z87891 Personal history of nicotine dependence: Secondary | ICD-10-CM | POA: Diagnosis not present

## 2014-10-07 HISTORY — PX: COLONOSCOPY: SHX5424

## 2014-10-07 SURGERY — COLONOSCOPY
Anesthesia: Moderate Sedation

## 2014-10-07 MED ORDER — MEPERIDINE HCL 100 MG/ML IJ SOLN
INTRAMUSCULAR | Status: DC | PRN
  Administered 2014-10-07 (×2): 25 mg via INTRAVENOUS

## 2014-10-07 MED ORDER — SODIUM CHLORIDE 0.9 % IV SOLN
INTRAVENOUS | Status: DC
  Administered 2014-10-07: 11:00:00 via INTRAVENOUS

## 2014-10-07 MED ORDER — MIDAZOLAM HCL 5 MG/5ML IJ SOLN
INTRAMUSCULAR | Status: DC
Start: 2014-10-07 — End: 2014-10-07
  Filled 2014-10-07: qty 10

## 2014-10-07 MED ORDER — MEPERIDINE HCL 100 MG/ML IJ SOLN
INTRAMUSCULAR | Status: AC
  Filled 2014-10-07: qty 2

## 2014-10-07 MED ORDER — MIDAZOLAM HCL 5 MG/5ML IJ SOLN
INTRAMUSCULAR | Status: DC | PRN
  Administered 2014-10-07 (×2): 1 mg via INTRAVENOUS
  Administered 2014-10-07: 2 mg via INTRAVENOUS

## 2014-10-07 MED ORDER — STERILE WATER FOR IRRIGATION IR SOLN
Status: DC | PRN
  Administered 2014-10-07: 12:00:00

## 2014-10-07 MED ORDER — HYDROCORTISONE ACE-PRAMOXINE 1-1 % RE FOAM: RECTAL | Status: DC

## 2014-10-07 NOTE — Discharge Instructions (Signed)
You have EXTERNAL hemorrhoids. YOU DID NOT HAVE ANY POLYPS.   FOLLOW A HIGH FIBER DIET. AVOID ITEMS THAT CAUSE BLOATING. SEE INFO BELOW.  USE PROCTOFOAM TWICE DAILY  TO RELIEVE RECTAL PRESSURE.  Next colonoscopy in 5 years.    Colonoscopy Care After Read the instructions outlined below and refer to this sheet in the next week. These discharge instructions provide you with general information on caring for yourself after you leave the hospital. While your treatment has been planned according to the most current medical practices available, unavoidable complications occasionally occur. If you have any problems or questions after discharge, call DR. Carlosdaniel Grob, [phone].  ACTIVITY  You may resume your regular activity, but move at a slower pace for the next 24 hours.   Take frequent rest periods for the next 24 hours.   Walking will help get rid of the air and reduce the bloated feeling in your belly (abdomen).   No driving for 24 hours (because of the medicine (anesthesia) used during the test).   You may shower.   Do not sign any important legal documents or operate any machinery for 24 hours (because of the anesthesia used during the test).    NUTRITION  Drink plenty of fluids.   You may resume your normal diet as instructed by your doctor.   Begin with a light meal and progress to your normal diet. Heavy or fried foods are harder to digest and may make you feel sick to your stomach (nauseated).   Avoid alcoholic beverages for 24 hours or as instructed.    MEDICATIONS  You may resume your normal medications.   WHAT YOU CAN EXPECT TODAY  Some feelings of bloating in the abdomen.   Passage of more gas than usual.   Spotting of blood in your stool or on the toilet paper  .  IF YOU HAD POLYPS REMOVED DURING THE COLONOSCOPY:  Eat a soft diet IF YOU HAVE NAUSEA, BLOATING, ABDOMINAL PAIN, OR VOMITING.    FINDING OUT THE RESULTS OF YOUR TEST Not all test results  are available during your visit. DR. Oneida Alar WILL CALL YOU WITHIN 7 DAYS OF YOUR PROCEDUE WITH YOUR RESULTS. Do not assume everything is normal if you have not heard from DR. Nivia Gervase IN ONE WEEK, CALL HER OFFICE AT [phone].  SEEK IMMEDIATE MEDICAL ATTENTION AND CALL THE OFFICE: [phone] IF:  You have more than a spotting of blood in your stool.   Your belly is swollen (abdominal distention).   You are nauseated or vomiting.   You have a temperature over 101F.   You have abdominal pain or discomfort that is severe or gets worse throughout the day.  High-Fiber Diet A high-fiber diet changes your normal diet to include more whole grains, legumes, fruits, and vegetables. Changes in the diet involve replacing refined carbohydrates with unrefined foods. The calorie level of the diet is essentially unchanged. The Dietary Reference Intake (recommended amount) for adult males is 38 grams per day. For adult females, it is 25 grams per day. Pregnant and lactating women should consume 28 grams of fiber per day. Fiber is the intact part of a plant that is not broken down during digestion. Functional fiber is fiber that has been isolated from the plant to provide a beneficial effect in the body. PURPOSE  Increase stool bulk.   Ease and regulate bowel movements.   Lower cholesterol.  INDICATIONS THAT YOU NEED MORE FIBER  Constipation and hemorrhoids.   Uncomplicated diverticulosis (intestine condition)  and irritable bowel syndrome.   Weight management.   As a protective measure against hardening of the arteries (atherosclerosis), diabetes, and cancer.   GUIDELINES FOR INCREASING FIBER IN THE DIET  Start adding fiber to the diet slowly. A gradual increase of about 5 more grams (2 slices of whole-wheat bread, 2 servings of most fruits or vegetables, or 1 bowl of high-fiber cereal) per day is best. Too rapid an increase in fiber may result in constipation, flatulence, and bloating.   Drink  enough water and fluids to keep your urine clear or pale yellow. Water, juice, or caffeine-free drinks are recommended. Not drinking enough fluid may cause constipation.   Eat a variety of high-fiber foods rather than one type of fiber.   Try to increase your intake of fiber through using high-fiber foods rather than fiber pills or supplements that contain small amounts of fiber.   The goal is to change the types of food eaten. Do not supplement your present diet with high-fiber foods, but replace foods in your present diet.  INCLUDE A VARIETY OF FIBER SOURCES  Replace refined and processed grains with whole grains, canned fruits with fresh fruits, and incorporate other fiber sources. White rice, white breads, and most bakery goods contain little or no fiber.   Brown whole-grain rice, buckwheat oats, and many fruits and vegetables are all good sources of fiber. These include: broccoli, Brussels sprouts, cabbage, cauliflower, beets, sweet potatoes, white potatoes (skin on), carrots, tomatoes, eggplant, squash, berries, fresh fruits, and dried fruits.   Cereals appear to be the richest source of fiber. Cereal fiber is found in whole grains and bran. Bran is the fiber-rich outer coat of cereal grain, which is largely removed in refining. In whole-grain cereals, the bran remains. In breakfast cereals, the largest amount of fiber is found in those with "bran" in their names. The fiber content is sometimes indicated on the label.   You may need to include additional fruits and vegetables each day.   In baking, for 1 cup white flour, you may use the following substitutions:   1 cup whole-wheat flour minus 2 tablespoons.   1/2 cup white flour plus 1/2 cup whole-wheat flour.   Hemorrhoids Hemorrhoids are dilated (enlarged) veins around the rectum. Sometimes clots will form in the veins. This makes them swollen and painful. These are called thrombosed hemorrhoids. Causes of hemorrhoids  include:  Constipation.   Straining to have a bowel movement.   HEAVY LIFTING HOME CARE INSTRUCTIONS  Eat a well balanced diet and drink 6 to 8 glasses of water every day to avoid constipation. You may also use a bulk laxative.   Avoid straining to have bowel movements.   Keep anal area dry and clean.   Do not use a donut shaped pillow or sit on the toilet for long periods. This increases blood pooling and pain.   Move your bowels when your body has the urge; this will require less straining and will decrease pain and pressure.

## 2014-10-07 NOTE — H&P (Signed)
Primary Care Physician:  Andres Shad, MD Primary Gastroenterologist:  Dr. Oneida Alar  Pre-Procedure History & Physical: HPI:  [name] [name] is a 68 y.o. female here for [place].  Past Medical History  Diagnosis Date  . GERD (gastroesophageal reflux disease)   . Hyperlipidemia   . Hypertension   . PUD (peptic ulcer disease)   . Gout   . Syncope     Neurocardiogenic    Past Surgical History  Procedure Laterality Date  . Abdominal hysterectomy    . Lipoma resection      Prior to Admission medications   Medication Sig Start Date End Date Taking? Authorizing Provider  meclizine (ANTIVERT) 25 MG tablet Take 12.5-25 mg by mouth 3 (three) times daily as needed for dizziness.    Yes Historical Provider, MD  Neomycin-Bacitracin-Polymyxin (HCA TRIPLE ANTIBIOTIC OINTMENT EX) Apply 1 application topically daily as needed (itching).  01/18/14  Yes Historical Provider, MD  omeprazole (PRILOSEC) 40 MG capsule Take 40 mg by mouth daily.     Yes Historical Provider, MD  peg 3350 powder (MOVIPREP) 100 G SOLR Take 1 kit (200 g total) by mouth as directed. 09/28/14  Yes Danie Binder, MD  triamterene-hydrochlorothiazide (MAXZIDE) 75-50 MG per tablet Take 1 tablet by mouth daily.   Yes Historical Provider, MD  colchicine 0.6 MG tablet Take 0.6 mg by mouth daily. As directed    Historical Provider, MD  Fluticasone-Salmeterol (ADVAIR) 500-50 MCG/DOSE AEPB Inhale 1 puff into the lungs every 12 (twelve) hours.    Historical Provider, MD  furosemide (LASIX) 20 MG tablet Take 20 mg by mouth daily.      Historical Provider, MD  metoprolol succinate (TOPROL-XL) 25 MG 24 hr tablet Take 1 tablet (25 mg total) by mouth daily. 05/19/13   Satira Sark, MD  Naproxen Sodium 220 MG CAPS Take 220 mg by mouth daily as needed (pain).  01/18/14   Historical Provider, MD  potassium chloride (K-DUR) 10 MEQ tablet Take 20 mEq by mouth 2 (two) times daily.  11/17/13   Historical Provider, MD   simvastatin (ZOCOR) 40 MG tablet Take 40 mg by mouth daily.      Historical Provider, MD  STOOL SOFTENER & LAXATIVE 8.6-50 MG per tablet Take 1 tablet by mouth daily as needed (constipation).  01/18/14   Historical Provider, MD  valsartan (DIOVAN) 160 MG tablet Take 160 mg by mouth daily.      Historical Provider, MD    Allergies as of 09/23/2014 - Review Complete 09/23/2014  Allergen Reaction Noted  . Acetaminophen-codeine    . Allopurinol    . Tramadol      Family History  Problem Relation Age of Onset  . Stroke Mother   . Dementia Father     History   Social History  . Marital Status: Single    Spouse Name: N/A    Number of Children: N/A  . Years of Education: N/A   Occupational History  . Retired    Social History Main Topics  . Smoking status: Former Smoker -- 1.00 packs/day for 20 years    Types: Cigarettes    Quit date: 10/07/1997  . Smokeless tobacco: Never Used  . Alcohol Use: No  . Drug Use: No  . Sexual Activity: Not on file   Other Topics Concern  . Not on file   Social History Narrative  . No narrative on file    Review of Systems: See HPI, otherwise negative ROS   Physical  Exam: BP 150/75  Pulse 64  Temp(Src) 97.9 F (36.6 C) (Oral)  Resp 18  SpO2 98% General:   Alert,  pleasant and cooperative in NAD Head:  Normocephalic and atraumatic. Neck:  Supple; Lungs:  Clear throughout to auscultation.    Heart:  Regular rate and rhythm. Abdomen:  Soft, nontender and nondistended. Normal bowel sounds, without guarding, and without rebound.   Neurologic:  Alert and  oriented x4;  grossly normal neurologically.  Impression/Plan:     SCREENING  Plan:  1. TCS TODAY

## 2014-10-10 NOTE — Op Note (Signed)
[facility] [address] [place], [address]   COLONOSCOPY PROCEDURE REPORT  PATIENT: [name] [name], [name] [name]  MR#: [id number] BIRTHDATE: 1946/10/25 , 68  yrs. old GENDER: female ENDOSCOPIST: Barney Drain, MD REFERRED [id number] Vivi Martens, M.D. PROCEDURE DATE:  10/07/2014 PROCEDURE:   Colonoscopy, screening INDICATIONS:average risk for colon cancer. MEDICATIONS: Demerol 50 mg IV and Versed 4 mg IV  DESCRIPTION OF PROCEDURE:    Physical exam was performed.  Informed consent was obtained from the patient after explaining the benefits, risks, and alternatives to procedure.  The patient was connected to monitor and placed in left lateral position. Continuous oxygen was provided by nasal cannula and IV medicine administered through an indwelling cannula.  After administration of sedation and rectal exam, the patients rectum was intubated and the [id number]  colonoscope was advanced under direct visualization to the cecum.  The scope was removed slowly by carefully examining the color, texture, anatomy, and integrity mucosa on the way out. colonoscope was advanced under direct visualization to the TRANSVERSE COLON.  The scope was removed slowly by carefully examining the color, texture, anatomy, and integrity mucosa on the way out.  The patient was recovered in endoscopy and discharged home in satisfactory condition.    COLON FINDINGS: The colon was redundant.  Manual abdominal counter-pressure was used to reach the cecum.  The patient was moved on to their back to reach the cecum, The examination was otherwise normal.  , and Moderate sized external hemorrhoids were found.  PREP QUALITY: adequate.   CECAL W/D TIME: 23 mins COMPLICATIONS: None  ENDOSCOPIC IMPRESSION: 1.   The LEFT Colon IS redundant 2.   The examination was otherwise normal 3.   Moderate sized external hemorrhoids  RECOMMENDATIONS: FOLLOW A HIGH FIBER DIET.  AVOID ITEMS THAT CAUSE  BLOATING. USE PROCTOFOAM TWICE DAILY TO RELIEVE RECTAL PRESSURE. Next colonoscopy in 5 years WITH AN OVERTUBE.  POLYPS < 5 MM MAY HAVE BEEN MISSED DUE TO PT CONSUMING BROTH WITH OIL DROPLETS WHICH ADHERED TO THE SCOPE AND DIMINISHED IMAGE QUALITY.      _______________________________ eSignedBarney Drain, MD 2014-11-01 11:11 AM    CPT CODES: ICD CODES:  The ICD and CPT codes recommended by this software are interpretations from the data that the clinical staff has captured with the software.  The verification of the translation of this report to the ICD and CPT codes and modifiers is the sole responsibility of the health care institution and practicing physician where this report was generated.  [employer] will not be held responsible for the validity of the ICD and CPT codes included on this report.  AMA assumes no liability for data contained or not contained herein. CPT is a Designer, television/film set of the [employer].

## 2014-10-11 ENCOUNTER — Encounter (HOSPITAL_COMMUNITY): Payer: Self-pay | Admitting: Gastroenterology

## 2015-02-03 ENCOUNTER — Ambulatory Visit (INDEPENDENT_AMBULATORY_CARE_PROVIDER_SITE_OTHER): Payer: Medicare PPO | Admitting: Orthopedic Surgery

## 2015-02-03 ENCOUNTER — Encounter: Payer: Self-pay | Admitting: Orthopedic Surgery

## 2015-02-03 VITALS — BP 130/74 | Ht 62.0 in | Wt 181.0 lb

## 2015-02-03 DIAGNOSIS — M7662 Achilles tendinitis, left leg: Secondary | ICD-10-CM

## 2015-02-03 NOTE — Progress Notes (Signed)
Patient ID: [name] [name], female   DOB: [birth year], 69 y.o.   MRN: [id number]  Chief Complaint  Patient presents with  . Foot Pain    Left heel pain, no injury.    HPI [name] [name] is a 69 y.o. female.  Presents to [place] with a two-week history of pain and swelling in the posterior aspect of her left heel. She went to the ER she was put on Mobitz she took it for a while got good relief but it might of upset her stomach so she went back to taking 2 Aleve once a day. She denies any trauma. Denies catching locking or giving way and actually says that since she took them over occurs symptoms have all but resolved  HPI  Review of Systems Review of Systems Dental problems are noted under her review of systems as well as vision disturbance breathing issues joint pain and hayfever   Past Medical History  Diagnosis Date  . GERD (gastroesophageal reflux disease)   . Hyperlipidemia   . Hypertension   . PUD (peptic ulcer disease)   . Gout   . Syncope     Neurocardiogenic    Past Surgical History  Procedure Laterality Date  . Abdominal hysterectomy    . Lipoma resection    . Colonoscopy N/A 10/07/2014    Procedure: COLONOSCOPY;  Surgeon: Danie Binder, MD;  Location: [facility];  Service: Endoscopy;  Laterality: N/A;  11:30 AM - moved to 11:45 - Ginger to notify pt    Family History  Problem Relation Age of Onset  . Stroke Mother   . Dementia Father     Social History History  Substance Use Topics  . Smoking status: Former Smoker -- 1.00 packs/day for 20 years    Types: Cigarettes    Quit date: 10/07/1997  . Smokeless tobacco: Never Used  . Alcohol Use: No    Allergies  Allergen Reactions  . Acetaminophen-Codeine   . Allopurinol   . Tramadol     Current Outpatient Prescriptions  Medication Sig Dispense Refill  . colchicine 0.6 MG tablet Take 0.6 mg by mouth daily. As directed    . Fluticasone-Salmeterol (ADVAIR) 500-50 MCG/DOSE AEPB Inhale 1 puff into the lungs  every 12 (twelve) hours.    . furosemide (LASIX) 20 MG tablet Take 20 mg by mouth daily.      . hydrocortisone-pramoxine (PROCTOFOAM HC) rectal foam 1 PR BID FOR 10 DAYS 20 g 1  . meclizine (ANTIVERT) 25 MG tablet Take 12.5-25 mg by mouth 3 (three) times daily as needed for dizziness.     . metoprolol succinate (TOPROL-XL) 25 MG 24 hr tablet Take 1 tablet (25 mg total) by mouth daily. 90 tablet 1  . Naproxen Sodium 220 MG CAPS Take 220 mg by mouth daily as needed (pain).     . Neomycin-Bacitracin-Polymyxin (HCA TRIPLE ANTIBIOTIC OINTMENT EX) Apply 1 application topically daily as needed (itching).     Marland Kitchen omeprazole (PRILOSEC) 40 MG capsule Take 40 mg by mouth daily.      . potassium chloride (K-DUR) 10 MEQ tablet Take 20 mEq by mouth 2 (two) times daily.     . simvastatin (ZOCOR) 40 MG tablet Take 40 mg by mouth daily.      . STOOL SOFTENER & LAXATIVE 8.6-50 MG per tablet Take 1 tablet by mouth daily as needed (constipation).     . triamterene-hydrochlorothiazide (MAXZIDE) 75-50 MG per tablet Take 1 tablet by mouth daily.    Marland Kitchen  valsartan (DIOVAN) 160 MG tablet Take 160 mg by mouth daily.       No current facility-administered medications for this visit.       Physical Exam Blood pressure 130/74, height 5\' 2"  (1.575 m), weight 181 lb (82.101 kg). Physical Exam The patient is well-developed well-nourished well-groomed she is oriented 3 her mood is pleasant her gait is normal. She has palpable tenderness without swelling left Achilles shows abundant deformity of the great toe. Ankle range of motion is normal ankle is stable plantar flexion strength is grade 5. Skin is intact pulses are good sensation is normal  Data Reviewed Plain films from [facility] show desiccation in the Achilles tendon. Independently reviewed  Assessment    Encounter Diagnosis  Name Primary?  . Tendonitis, Achilles, left Yes        Plan    Recommend Aleve and 3 weeks of physical therapy follow-up with  [place] as needed as she is improving

## 2015-02-18 ENCOUNTER — Ambulatory Visit (INDEPENDENT_AMBULATORY_CARE_PROVIDER_SITE_OTHER): Payer: Medicare PPO | Admitting: Cardiology

## 2015-02-18 ENCOUNTER — Encounter: Payer: Self-pay | Admitting: Cardiology

## 2015-02-18 VITALS — BP 124/68 | HR 66 | Ht 62.0 in | Wt 186.0 lb

## 2015-02-18 DIAGNOSIS — R55 Syncope and collapse: Secondary | ICD-10-CM

## 2015-02-18 DIAGNOSIS — I10 Essential (primary) hypertension: Secondary | ICD-10-CM

## 2015-02-18 NOTE — Patient Instructions (Signed)
Your physician wants you to follow-up in: 8 months with Dr Ferne Reus will receive a reminder letter in the mail two months in advance. If you don't receive a letter, please call our office to schedule the follow-up appointment.    Your physician recommends that you continue on your current medications as directed. Please refer to the Current Medication list given to you today.      Thank you for choosing [facility] !

## 2015-02-18 NOTE — Progress Notes (Signed)
Cardiology Office Note  Date: 02/18/2015   ID: [name], [name] [birth year], MRN [id number]  PCP: Andres Shad, MD  Primary Cardiologist: Rozann Lesches, MD   Chief Complaint  Patient presents with  . Vasovagal syncope    History of Present Illness: [name] [name] is a 69 y.o. female last seen in August 2015. She presents for a routine follow-up visit, no symptoms of exertional chest pain, increasing shortness of breath, or recurring syncope. I reviewed her medications which are outlined below.  She does tell me that she has had some trouble with left foot tendinitis, will be undergoing physical therapy. This has limited her somewhat.   Past Medical History  Diagnosis Date  . GERD (gastroesophageal reflux disease)   . Hyperlipidemia   . Hypertension   . PUD (peptic ulcer disease)   . Gout   . Syncope     Neurocardiogenic    Current Outpatient Prescriptions  Medication Sig Dispense Refill  . colchicine 0.6 MG tablet Take 0.6 mg by mouth daily. As directed    . Fluticasone-Salmeterol (ADVAIR) 500-50 MCG/DOSE AEPB Inhale 1 puff into the lungs every 12 (twelve) hours.    . furosemide (LASIX) 20 MG tablet Take 20 mg by mouth daily.      . hydrocortisone-pramoxine (PROCTOFOAM HC) rectal foam 1 PR BID FOR 10 DAYS 20 g 1  . meclizine (ANTIVERT) 25 MG tablet Take 12.5-25 mg by mouth 3 (three) times daily as needed for dizziness.     . metoprolol succinate (TOPROL-XL) 25 MG 24 hr tablet Take 1 tablet (25 mg total) by mouth daily. 90 tablet 1  . Naproxen Sodium 220 MG CAPS Take 220 mg by mouth daily as needed (pain).     . Neomycin-Bacitracin-Polymyxin (HCA TRIPLE ANTIBIOTIC OINTMENT EX) Apply 1 application topically daily as needed (itching).     Marland Kitchen omeprazole (PRILOSEC) 40 MG capsule Take 40 mg by mouth daily.      . potassium chloride (K-DUR) 10 MEQ tablet Take 20 mEq by mouth 2 (two) times daily.     . simvastatin (ZOCOR) 40 MG tablet Take 40 mg by mouth daily.        . STOOL SOFTENER & LAXATIVE 8.6-50 MG per tablet Take 1 tablet by mouth daily as needed (constipation).     . triamterene-hydrochlorothiazide (MAXZIDE) 75-50 MG per tablet Take 1 tablet by mouth daily.    . valsartan (DIOVAN) 160 MG tablet Take 160 mg by mouth daily.       No current facility-administered medications for this visit.    Allergies:  Acetaminophen-codeine; Allopurinol; and Tramadol   Social History: The patient  reports that she quit smoking about 17 years ago. Her smoking use included Cigarettes. She started smoking about 50 years ago. She has a 20 pack-year smoking history. She has never used smokeless tobacco. She reports that she does not drink alcohol or use illicit drugs.   ROS:  Please see the history of present illness. Otherwise, complete review of systems is positive for none.  All other systems are reviewed and negative.    Physical Exam: VS:  BP 124/68 mmHg  Pulse 66  Ht 5\' 2"  (1.575 m)  Wt 186 lb (84.369 kg)  BMI 34.01 kg/m2  SpO2 97%, BMI Body mass index is 34.01 kg/(m^2).  Wt Readings from Last 3 Encounters:  02/18/15 186 lb (84.369 kg)  02/03/15 181 lb (82.101 kg)  08/16/14 184 lb (83.462 kg)     Appears comfortable at rest  HEENT: Conjunctiva and lids normal, oropharynx clear.  Neck: Supple, no elevated JVP or bruits.  Lungs: Clear to auscultation, nonlabored.  Cardiac: Regular rate and rhythm, no S3.  Pharynx: No pitting edema.  Skin: Warm and dry.   ECG: ECG is not ordered today.   Other Studies Reviewed Today:  Exercise echocardiogram 12/28/2013: Study Conclusions  - Stress: Functional capacity was excellent, >150% of predicted based on age and gender. - Stress ECG conclusions: The stress ECG was equivocal, baseline ST/T changes became more prominent with stress. This may be secondary to cavity obliteration or hypertensive response in the setting of underlying LVH. - Baseline: LV global systolic function was normal.  There is baseline LVH and mitral anular calcification. - Peak stress: LV global systolic function was normal. There was hyperdynamic response of all wall segments with cavity obliteration. Impressions:  - Normal study after maximal exercise.  ASSESSMENT AND PLAN:  1. 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 Continues on beta blocker.  2. Essential hypertension, blood pressure is well controlled today.  Current medicines are reviewed at length with the patient today.  The patient does not have concerns regarding medicines.  Disposition: FU with me in 8 months.   Signed, Satira Sark, MD, [facility] 02/18/2015 10:10 AM    [facility] [address], [place], [place] [address] Phone: [phone]; Fax: [phone]

## 2015-02-21 ENCOUNTER — Ambulatory Visit (HOSPITAL_COMMUNITY): Payer: Medicare PPO | Attending: Orthopedic Surgery | Admitting: Physical Therapy

## 2015-02-21 ENCOUNTER — Encounter (HOSPITAL_COMMUNITY): Payer: Self-pay | Admitting: Physical Therapy

## 2015-02-21 DIAGNOSIS — R2689 Other abnormalities of gait and mobility: Secondary | ICD-10-CM | POA: Diagnosis not present

## 2015-02-21 DIAGNOSIS — M25572 Pain in left ankle and joints of left foot: Secondary | ICD-10-CM | POA: Insufficient documentation

## 2015-02-21 DIAGNOSIS — Z7409 Other reduced mobility: Secondary | ICD-10-CM

## 2015-02-21 DIAGNOSIS — R531 Weakness: Secondary | ICD-10-CM

## 2015-02-21 DIAGNOSIS — R269 Unspecified abnormalities of gait and mobility: Secondary | ICD-10-CM

## 2015-02-21 NOTE — Therapy (Signed)
[place], [place], [address] Phone: [phone]   Fax:  [phone]  Physical Therapy Evaluation  Patient Details  Name: [name] [name] MRN: [id number] Date of Birth: 1946-01-21 Referring Provider:  Carole Civil, MD  Encounter Date: 02/21/2015      PT End of Session - 02/21/15 1155    Visit Number 1   Number of Visits 9   Date for PT Re-Evaluation 03/14/15   Authorization Type [employer]   Authorization Time Period 02/21/15 to 04/22/15   Authorization - Visit Number 1   Authorization - Number of Visits 10   PT Start Time 1103   PT Stop Time 1143   PT Time Calculation (min) 40 min   Activity Tolerance Patient tolerated treatment well   Behavior During Therapy [facility] for tasks assessed/performed      Past Medical History  Diagnosis Date  . GERD (gastroesophageal reflux disease)   . Hyperlipidemia   . Hypertension   . PUD (peptic ulcer disease)   . Gout   . Syncope     Neurocardiogenic    Past Surgical History  Procedure Laterality Date  . Abdominal hysterectomy    . Lipoma resection    . Colonoscopy N/A 10/07/2014    Procedure: COLONOSCOPY;  Surgeon: Danie Binder, MD;  [facility];  Service: Endoscopy;  Laterality: N/A;  11:30 AM - moved to 11:45 - Ginger to notify pt    There were no vitals taken for this visit.  Visit Diagnosis:  Pain in joint, ankle and foot, left  Weakness generalized  Impaired functional mobility and activity tolerance  Abnormality of gait      Subjective Assessment - 02/21/15 1106    Symptoms patient describes pain as just being terrible; VERY achey, deep   Pertinent History L heel just started hurting overnight; patient thought that pain was due to gout at first but could not put weight down through heel so went to see MD. Patient is retired. At worst pain 10/10, at best 0/10, 2/10.    How long can you sit comfortably? no limitations   How  long can you stand comfortably? 45-60 minutes   How long can you walk comfortably? 60 minutes   Patient Stated Goals to be able to stay on feet longer   Currently in Pain? No/denies          [facility] PT Assessment - 02/21/15 0001    Assessment   Onset Date 01/04/15   Next MD Visit If patient does well here, does not need to be seen again by MD   Balance Screen   Has the patient fallen in the past 6 months No   Has the patient had a decrease in activity level because of a fear of falling?  No   Is the patient reluctant to leave their home because of a fear of falling?  No   Observation/Other Assessments   Observations Bilateral flat footed gait pattern with L foot displaying increased navicular drop as compared to R; calcaneal eversion noted with pronating gait pattern, reduced hip mobility during gait   Focus on Therapeutic Outcomes (FOTO)  45% limited   AROM   Right Hip External Rotation  50   Right Hip Internal Rotation  20   Left Hip External Rotation  32   Left Hip Internal Rotation  22   Right Ankle Dorsiflexion 10   Left Ankle Dorsiflexion 12   Strength  Right Hip Flexion 3/5   Right Hip Extension 2+/5   Right Hip ABduction 3/5   Left Hip Flexion 3/5   Left Hip Extension 2+/5   Left Hip ABduction 3/5   Right Knee Flexion 3+/5   Right Knee Extension 4-/5   Left Knee Flexion 4-/5   Left Knee Extension 4-/5   Right Ankle Dorsiflexion 5/5   Left Ankle Dorsiflexion 4-/5   Palpation   Palpation L foot joint mechanics reduced especially with calcalaneal inversion/eversion                  [facility] Adult PT Treatment/Exercise - 02/21/15 0001    Knee/Hip Exercises: Stretches   Active Hamstring Stretch 3 reps;30 seconds   Active Hamstring Stretch Limitations 14 inch box   Piriformis Stretch 2 reps;30 seconds   Piriformis Stretch Limitations seated   Gastroc Stretch 3 reps;30 seconds   Gastroc Stretch Limitations slantboard   Knee/Hip Exercises: Standing   Other  Standing Knee Exercises Hip abduction and extension 1x10 with cues for form   Knee/Hip Exercises: Supine   Bridges 1 set;10 reps   Ankle Exercises: Seated   ABC's 1 rep   ABC's Limitations full alphabet   Other Seated Ankle Exercises Inversion/eversion 1x10 with cues for form; ankle PF/DF 1x10                PT Education - 02/21/15 1155    Education provided Yes   Education Details HEP; physical therapy POC moving forward   Person(s) Educated Patient   Methods Explanation   Comprehension Verbalized understanding;Returned demonstration          PT Short Term Goals - 02/21/15 1200    PT SHORT TERM GOAL #1   Title Patient will demonstrate the ability to perform upright functional tasks in standing for at least 90 minutes with pain no more than 1/10 left ankle   Time 2   Period Weeks   Status New   PT SHORT TERM GOAL #2   Title Patient will demonstrate an improvement of strength of at least one grade in all muscle groups in order to promote overall stability and reduce pain L ankle   Time 2   Period Weeks   Status New   PT SHORT TERM GOAL #3   Title Patient will demonstrate full range of motion in ankle dorsiflexors in order to improve overall gait pattern, reduce L ankle pain, and improve balance reaction skills   Time 2   Period Weeks   Status New   PT SHORT TERM GOAL #4   Title Patient will demonstrate the ability to correctly and consistently perform appropriate HEP in order to maintain status and reduce chances of recurrence of L ankle pain   Time 2   Period Weeks   Status New           PT Long Term Goals - 02/21/15 1202    PT LONG TERM GOAL #1   Title Patient will demonstrate the ablity to consistently and correctly perform advanced HEP in order to maintain functional gains obtained with skilled PT services   Time 3   Period Weeks   Status New   PT LONG TERM GOAL #2   Title Patient will demonstrate the ability to perform upright standing tasks for at  least 2.5 hours with pain 0/10 left ankle in order to enhance ability to perform functional tasks   Time 3   Period Weeks   Status New   PT LONG  TERM GOAL #3   Title Patient will demonstrate the ability to safely ascend/descend full flight of stairs without railing, good biomechanics, and no cues from therapist for appropriate mechanics during task   Time 3   Period Weeks   Status New   PT LONG TERM GOAL #4   Title Patient will demonstrate the ability to ambulate unlimited distances without AD, pain 0/10 L ankle in order to facilitate ability to perform functional tasks such as grocery shopping within her community   Time 3   Period [place] - 2015/02/22 1156    Clinical Impression Statement Patient presents with general tightness in bilateral hips as well as reduced joint mobility in entirety of L foot, most markedly in calcaneal inversion/eversion at time of evaluation. Generailzed weakness in bilateral lower extremities as well as reduced activity tolerance as evidenced by patient's quick fatigue with simple exercises. Patient will benefit from skilled PT services to improve functional strength, functional  mobility, joint mobility, gait mechanics, and to reduce pain in L ankle.    Pt will benefit from skilled therapeutic intervention in order to improve on the following deficits Abnormal gait;Decreased endurance;Decreased activity tolerance;Decreased strength;Impaired flexibility;Postural dysfunction;Decreased balance;Decreased mobility;Difficulty walking;Decreased range of motion;Pain;Decreased coordination;Decreased safety awareness   Rehab Potential Good   PT Frequency 3x / week   PT Duration 3 weeks   PT Treatment/Interventions ADLs/Self Care Home Management;Gait training;Neuromuscular re-education;Stair training;Functional mobility training;Patient/family education;Cryotherapy;Therapeutic activities;Therapeutic exercise;Manual techniques;Balance  training   PT Next Visit Plan continue functional stretching, strengthening; gait training; bike or nustep   PT Home Exercise Plan given   Consulted and Agree with Plan of Care Patient          G-Codes - 2015/02/22 March 18, 1206    Functional Assessment Tool Used FOTO   Functional Limitation Mobility: Walking and moving around   Mobility: Walking and Moving Around Current Status [phone]) At least 40 percent but less than 60 percent impaired, limited or restricted   Mobility: Walking and Moving Around Goal Status [phone]) At least 20 percent but less than 40 percent impaired, limited or restricted       Problem List Patient Active Problem List   Diagnosis Date Noted  . Chest pain 12/09/2013  . Hypokalemia 05/01/2012  . Renal insufficiency 05/01/2012  . Essential hypertension, benign 05/03/2010  . VASOVAGAL SYNCOPE 05/03/2010    Deniece Ree PT, DPT [place] [address] [place], [place], [address] Phone: [phone]   Fax:  [phone]

## 2015-02-21 NOTE — Patient Instructions (Signed)
EXTENSION: Standing (Active)   Stand, both feet flat. Draw right leg behind body as far as possible. Use _0__ lbs. Complete _1__ sets of _10__ repetitions. Perform __2_ sessions per day.  http://gtsc.exer.us/77   Copyright  VHI. All rights reserved.  ABDUCTION: Standing (Active)   Stand, feet flat. Lift right leg out to side. Use __0_ lbs. Complete _1__ sets of __10_ repetitions. Perform _2__ sessions per day.  http://gtsc.exer.us/111   Copyright  VHI. All rights reserved.  Bridge   Lie back, legs bent. Inhale, pressing hips up. Keeping ribs in, lengthen lower back. Exhale, rolling down along spine from top. Repeat __10__ times. Do __2__ sessions per day.  Copyright  VHI. All rights reserved.  Ankle Eversion   With ankle movement only, move left foot so sole of foot faces outward. Repeat __10__ times per session. Do __2__ sessions per day. Position: Standing   Copyright  VHI. All rights reserved.  Ankle Inversion   With ankle movement only, move left foot so sole of foot faces inward. Repeat __10__ times per session. Do __2__ sessions per day. Position: Standing   Copyright  VHI. All rights reserved.  Ankle Alphabet   Using left ankle and foot only, trace the letters of the alphabet. Perform A to Z. Repeat _1-2___ times per set. Do __1__ sets per session. Do __2__ sessions per day.  http://orth.exer.us/17   Copyright  VHI. All rights reserved.  Ankle Pumps   Lie with knees supported on bolster or sit. Straighten one knee. Keep knee straight; alternately press out and pull up heel. Emphasize movement of heel. Repeat __10_ times.  Copyright  VHI. All rights reserved.

## 2015-02-24 ENCOUNTER — Telehealth (HOSPITAL_COMMUNITY): Payer: Self-pay | Admitting: Physical Therapy

## 2015-02-24 NOTE — Telephone Encounter (Signed)
Calling for mix weather and she will not drive in that, she will return at her next appointed time next week.

## 2015-02-25 ENCOUNTER — Encounter (HOSPITAL_COMMUNITY): Payer: Medicare PPO | Admitting: Physical Therapy

## 2015-03-03 ENCOUNTER — Ambulatory Visit (HOSPITAL_COMMUNITY): Payer: Medicare PPO | Attending: Orthopedic Surgery

## 2015-03-03 DIAGNOSIS — R269 Unspecified abnormalities of gait and mobility: Secondary | ICD-10-CM

## 2015-03-03 DIAGNOSIS — R2689 Other abnormalities of gait and mobility: Secondary | ICD-10-CM | POA: Insufficient documentation

## 2015-03-03 DIAGNOSIS — M25572 Pain in left ankle and joints of left foot: Secondary | ICD-10-CM

## 2015-03-03 DIAGNOSIS — R531 Weakness: Secondary | ICD-10-CM

## 2015-03-03 DIAGNOSIS — Z7409 Other reduced mobility: Secondary | ICD-10-CM

## 2015-03-03 NOTE — Therapy (Addendum)
[place], [place], [address] Phone: [phone]   Fax:  [phone]  Physical Therapy Treatment  Patient Details  Name: [name] [name] MRN: [id number] Date of Birth: Dec 03, 1946 Referring Provider:  Andres Shad, *  Encounter Date: 03/03/2015      PT End of Session - 03/03/15 1137    Visit Number 2   Number of Visits 9   Date for PT Re-Evaluation 03/14/15   Authorization Type [employer]   Authorization Time Period 02/21/15 to 04/22/15   Authorization - Visit Number 2   Authorization - Number of Visits 10   PT Start Time 1130   PT Stop Time 1208   PT Time Calculation (min) 38 min   Activity Tolerance Patient tolerated treatment well   Behavior During Therapy [facility] for tasks assessed/performed      Past Medical History  Diagnosis Date  . GERD (gastroesophageal reflux disease)   . Hyperlipidemia   . Hypertension   . PUD (peptic ulcer disease)   . Gout   . Syncope     Neurocardiogenic    Past Surgical History  Procedure Laterality Date  . Abdominal hysterectomy    . Lipoma resection    . Colonoscopy N/A 10/07/2014    Procedure: COLONOSCOPY;  Surgeon: [name] Binder, MD;  [facility];  Service: Endoscopy;  Laterality: N/A;  11:30 AM - moved to 11:45 - Ginger to notify pt    There were no vitals taken for this visit.  Visit Diagnosis:  Pain in joint, ankle and foot, left  Weakness generalized  Impaired functional mobility and activity tolerance  Abnormality of gait      Subjective Assessment - 03/03/15 1134    Symptoms Pt stated pain free today, reports compliance with HEP without question.   Currently in Pain? No/denies                    [facility] Adult PT Treatment/Exercise - 03/03/15 0001    Knee/Hip Exercises: Stretches   Active Hamstring Stretch 3 reps;30 seconds   Active Hamstring Stretch Limitations 14 inch box   Knee: Self-Stretch Limitations  knee drives for dorsiflexion 3 directions 10x    Gastroc Stretch 3 reps;30 seconds   Gastroc Stretch Limitations slantboard   Knee/Hip Exercises: Standing   Functional Squat 10 reps   Functional Squat Limitations 3D hip excursion   Gait Training Heel to toe pattern and cueing for equal stride length and UE sequnecing to improve hip rotation   Other Standing Knee Exercises Hip abduction and extension 1x10 with cues for form   Other Standing Knee Exercises 3D ankle excursin beside counter 10x each                  PT Short Term Goals - 03/03/15 1144    PT SHORT TERM GOAL #1   Title Patient will demonstrate the ability to perform upright functional tasks in standing for at least 90 minutes with pain no more than 1/10 left ankle   Status On-going   PT SHORT TERM GOAL #2   Title Patient will demonstrate an improvement of strength of at least one grade in all muscle groups in order to promote overall stability and reduce pain L ankle   Status On-going   PT SHORT TERM GOAL #3   Title Patient will demonstrate full range of motion in ankle dorsiflexors in order to improve overall gait pattern, reduce L ankle pain,  and improve balance reaction skills   Status On-going   PT SHORT TERM GOAL #4   Title Patient will demonstrate the ability to correctly and consistently perform appropriate HEP in order to maintain status and reduce chances of recurrence of L ankle pain   Status On-going           PT Long Term Goals - 03/03/15 1145    PT LONG TERM GOAL #1   Title Patient will demonstrate the ablity to consistently and correctly perform advanced HEP in order to maintain functional gains obtained with skilled PT services   PT LONG TERM GOAL #2   Title Patient will demonstrate the ability to perform upright standing tasks for at least 2.5 hours with pain 0/10 left ankle in order to enhance ability to perform functional tasks   PT LONG TERM GOAL #3   Title Patient will demonstrate the  ability to safely ascend/descend full flight of stairs without railing, good biomechanics, and no cues from therapist for appropriate mechanics during task   PT LONG TERM GOAL #4   Title Patient will demonstrate the ability to ambulate unlimited distances without AD, pain 0/10 L ankle in order to facilitate ability to perform functional tasks such as grocery shopping within her community               Plan - 03/03/15 1138    Clinical Impression Statement Began PT POC with stretches and ankle mobility exercises to improve ROM, pt able to demosntrate appropriate techniques with exercises following initial cueing.  Gait traing for heel to toe sequence and cueing for UE sequence to improve hip rotation with gait.  Functional strengthening exercises complete for hip musculature strenghtening with cueing for weight laoding.  No reports of paih through session.     PT Next Visit Plan continue functional stretching, strengthening; gait training; bike or nustep        Problem List Patient Active Problem List   Diagnosis Date Noted  . Chest pain 12/09/2013  . Hypokalemia 05/01/2012  . Renal insufficiency 05/01/2012  . Essential hypertension, benign 05/03/2010  . VASOVAGAL SYNCOPE 05/03/2010   [name] [name], [place]   [name] [name] 03/03/2015, 12:03 PM  [place], [place], [address] Phone: [phone]   Fax:  [phone]         PHYSICAL THERAPY DISCHARGE SUMMARY  Visits from Start of Care: 2  Current functional level related to goals / functional outcomes: Spoke to patient over phone; patient states that she has no pain or functional difficulties with her affected ankle, and also has a hard time getting to therapy appointments right now, which is why she cancelled all appointments earlier this morning. Agreeable to DC.    Remaining deficits: Per patient, none stated; however patient does  likely continue to demonstrate reduced strength and some level of impaired gait mechanics, as well as reduced ankle dorisflexion on affected side.    Education / Equipment: Educated regarding need for MD referral should patient need skilled PT services in future.  Plan: Patient agrees to discharge.  Patient goals were not met. Patient is being discharged due to the patient's request.  ?????        G-code entry for DC that occurred on 03/07/15; patient's last encounter 03/03/15.   G-code category: Mobility- moving and walking about.  Discharge G-code CJ  Goal G-code Olivia Lopez de Gutierrez, [place] [phone]

## 2015-03-07 ENCOUNTER — Ambulatory Visit (HOSPITAL_COMMUNITY): Payer: Medicare PPO | Admitting: Physical Therapy

## 2015-03-07 ENCOUNTER — Telehealth (HOSPITAL_COMMUNITY): Payer: Self-pay | Admitting: Physical Therapy

## 2015-03-07 NOTE — Telephone Encounter (Signed)
Called home number and spoke to patient directly. Patient states that she is having no problems whatsoever with her ankle, and that she is having a really hard time getting to her appointments because of problems with transportation, which is why she called front desk to cancel all further appointments at this time. Patient very pleasant and agreeable to DC, educated that if she requires further skilled PT services she will require a referral from her MD.  Deniece Ree PT, DPT [phone]

## 2015-03-09 ENCOUNTER — Encounter (HOSPITAL_COMMUNITY): Payer: Medicare PPO

## 2015-03-11 ENCOUNTER — Encounter (HOSPITAL_COMMUNITY): Payer: Medicare PPO

## 2015-03-14 ENCOUNTER — Encounter (HOSPITAL_COMMUNITY): Payer: Medicare PPO | Admitting: Physical Therapy

## 2015-03-16 ENCOUNTER — Encounter (HOSPITAL_COMMUNITY): Payer: Medicare PPO

## 2015-03-18 ENCOUNTER — Encounter (HOSPITAL_COMMUNITY): Payer: Medicare PPO | Admitting: Physical Therapy

## 2015-03-21 ENCOUNTER — Encounter (HOSPITAL_COMMUNITY): Payer: Medicare PPO

## 2015-03-23 ENCOUNTER — Encounter (HOSPITAL_COMMUNITY): Payer: Medicare PPO | Admitting: Physical Therapy

## 2015-03-25 ENCOUNTER — Encounter (HOSPITAL_COMMUNITY): Payer: Medicare PPO | Admitting: Physical Therapy

## 2015-09-19 ENCOUNTER — Encounter: Payer: Self-pay | Admitting: Cardiology

## 2015-09-19 ENCOUNTER — Ambulatory Visit (INDEPENDENT_AMBULATORY_CARE_PROVIDER_SITE_OTHER): Payer: Medicare FFS | Admitting: Cardiology

## 2015-09-19 ENCOUNTER — Ambulatory Visit: Payer: Medicare PPO | Admitting: Cardiology

## 2015-09-19 VITALS — BP 140/74 | HR 66 | Ht 62.0 in | Wt 186.0 lb

## 2015-09-19 DIAGNOSIS — Z136 Encounter for screening for cardiovascular disorders: Secondary | ICD-10-CM | POA: Diagnosis not present

## 2015-09-19 DIAGNOSIS — R55 Syncope and collapse: Secondary | ICD-10-CM

## 2015-09-19 DIAGNOSIS — E785 Hyperlipidemia, unspecified: Secondary | ICD-10-CM | POA: Diagnosis not present

## 2015-09-19 NOTE — Progress Notes (Signed)
Cardiology Office Note  Date: 09/19/2015   ID: Kendalynn, [place] 01-Jun-1946, MRN [id number]  PCP: Andres Shad, MD  Primary Cardiologist: Rozann Lesches, MD   Chief Complaint  Patient presents with  . History of syncope    History of Present Illness: [name] [name] is a 69 y.o. female last seen in February. She presents for a routine follow-up visit. No episodes of syncope since last encounter. She continues on the medications outlined below including Toprol-XL. No other change in antihypertensive regimen.  Follow-up ECG today shows sinus rhythm with stable ST-T wave abnormalities compared to prior tracing, most consistent with repolarization abnormalities.  She reports NYHA class II dyspnea with typical activities, no chest pain or palpitations.   Past Medical History  Diagnosis Date  . GERD (gastroesophageal reflux disease)   . Hyperlipidemia   . Hypertension   . PUD (peptic ulcer disease)   . Gout   . Syncope     Neurocardiogenic    Current Outpatient Prescriptions  Medication Sig Dispense Refill  . colchicine 0.6 MG tablet Take 0.6 mg by mouth daily. As directed    . Fluticasone-Salmeterol (ADVAIR) 500-50 MCG/DOSE AEPB Inhale 1 puff into the lungs every 12 (twelve) hours.    . furosemide (LASIX) 20 MG tablet Take 20 mg by mouth daily.      . hydrocortisone-pramoxine (PROCTOFOAM HC) rectal foam 1 PR BID FOR 10 DAYS 20 g 1  . meclizine (ANTIVERT) 25 MG tablet Take 12.5-25 mg by mouth 3 (three) times daily as needed for dizziness.     . meloxicam (MOBIC) 7.5 MG tablet     . metoprolol succinate (TOPROL-XL) 25 MG 24 hr tablet Take 1 tablet (25 mg total) by mouth daily. 90 tablet 1  . Naproxen Sodium 220 MG CAPS Take 220 mg by mouth daily as needed (pain).     . Neomycin-Bacitracin-Polymyxin (HCA TRIPLE ANTIBIOTIC OINTMENT EX) Apply 1 application topically daily as needed (itching).     Marland Kitchen omeprazole (PRILOSEC) 40 MG capsule Take 40 mg by mouth daily.      .  potassium chloride (K-DUR) 10 MEQ tablet Take 20 mEq by mouth 2 (two) times daily.     . simvastatin (ZOCOR) 40 MG tablet Take 40 mg by mouth daily.      . STOOL SOFTENER & LAXATIVE 8.6-50 MG per tablet Take 1 tablet by mouth daily as needed (constipation).     . triamterene-hydrochlorothiazide (MAXZIDE) 75-50 MG per tablet Take 1 tablet by mouth daily.    . valsartan (DIOVAN) 160 MG tablet Take 160 mg by mouth daily.       No current facility-administered medications for this visit.    Allergies:  Acetaminophen-codeine; Allopurinol; and Tramadol   Social History: The patient  reports that she quit smoking about 17 years ago. Her smoking use included Cigarettes. She started smoking about 51 years ago. She has a 20 pack-year smoking history. She has never used smokeless tobacco. She reports that she does not drink alcohol or use illicit drugs.   ROS:  Please see the history of present illness. Otherwise, complete review of systems is positive for none.  All other systems are reviewed and negative.   Physical Exam: VS:  BP 140/74 mmHg  Pulse 66  Ht 5\' 2"  (1.575 m)  Wt 186 lb (84.369 kg)  BMI 34.01 kg/m2  SpO2 96%, BMI Body mass index is 34.01 kg/(m^2).  Wt Readings from Last 3 Encounters:  09/19/15 186 lb (84.369 kg)  02/18/15 186 lb (84.369 kg)  02/03/15 181 lb (82.101 kg)     Appears comfortable at rest  HEENT: Conjunctiva and lids normal, oropharynx clear.  Neck: Supple, no elevated JVP or bruits.  Lungs: Clear to auscultation, nonlabored.  Cardiac: Regular rate and rhythm, no S3.  Pharynx: No pitting edema.  Skin: Warm and dry.   ECG: ECG is ordered today.  Other Studies Reviewed Today:  Exercise echocardiogram 12/28/2013: Study Conclusions  - Stress: Functional capacity was excellent, >150% of predicted based on age and gender. - Stress ECG conclusions: The stress ECG was equivocal, baseline ST/T changes became more prominent with stress. This may be  secondary to cavity obliteration or hypertensive response in the setting of underlying LVH. - Baseline: LV global systolic function was normal. There is baseline LVH and mitral anular calcification. - Peak stress: LV global systolic function was normal. There was hyperdynamic response of all wall segments with cavity obliteration. Impressions:  - Normal study after maximal exercise.   ASSESSMENT AND PLAN:  1. 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  2. Hyperlipidemia, on Zocor, followed by primary care.  3. Negative ischemic workup from January 2015.  Current medicines were reviewed at length with the patient today.   Orders Placed This Encounter  Procedures  . EKG 12-Lead    Disposition: FU with me in 6 months.   Signed, Satira Sark, MD, [facility] 09/19/2015 10:21 AM    [facility] [address] [place], [place] [address] Phone: [phone]; Fax: [phone]

## 2015-09-19 NOTE — Patient Instructions (Signed)
Your physician wants you to follow-up in: 6 months with Dr.McDowell You will receive a reminder letter in the mail two months in advance. If you don't receive a letter, please call our office to schedule the follow-up appointment.     Your physician recommends that you continue on your current medications as directed. Please refer to the Current Medication list given to you today.     Thank you for choosing [facility] !        

## 2015-09-23 ENCOUNTER — Telehealth (HOSPITAL_COMMUNITY): Payer: Self-pay | Admitting: Specialist

## 2015-11-23 NOTE — Therapy (Signed)
[place] [facility] [address] [place], [place], [address] Phone: [phone]   Fax:  [phone]  Patient Details  Name: [name] [name] MRN: [id number] Date of Birth: 07/15/1946 Referring Provider:  Winfield, Albert Carl, *  Encounter Date: 11/23/2015  PHYSICAL THERAPY DISCHARGE SUMMARY  Visits from Start of Care: 2  Current functional level related to goals / functional outcomes: Patient has not returned since last visit in March.    Remaining deficits: Unable to assess    Education / Equipment: N/A  Plan: Patient agrees to discharge.  Patient goals were not met. Patient is being discharged due to not returning since the last visit.  ?????       Kristen Unger PT, DPT [phone]    [facility] [address] [place], [place], [address] Phone: [phone]   Fax:  [phone] 

## 2016-03-21 ENCOUNTER — Encounter: Payer: Self-pay | Admitting: Cardiology

## 2016-03-21 ENCOUNTER — Ambulatory Visit (INDEPENDENT_AMBULATORY_CARE_PROVIDER_SITE_OTHER): Payer: Medicare PPO | Admitting: Cardiology

## 2016-03-21 VITALS — BP 142/80 | HR 71 | Ht 62.0 in | Wt 181.0 lb

## 2016-03-21 DIAGNOSIS — I10 Essential (primary) hypertension: Secondary | ICD-10-CM | POA: Diagnosis not present

## 2016-03-21 DIAGNOSIS — R55 Syncope and collapse: Secondary | ICD-10-CM | POA: Diagnosis not present

## 2016-03-21 MED ORDER — METOPROLOL SUCCINATE ER 25 MG PO TB24: 25.0000 mg | ORAL_TABLET | Freq: Every day | ORAL | Status: DC

## 2016-03-21 NOTE — Progress Notes (Signed)
Cardiology Office Note  Date: 03/21/2016   ID: [name] [name], [name] [name] 1946-07-27, MRN [id number]  PCP: Andres Shad, MD  Primary Cardiologist: Rozann Lesches, MD   Chief Complaint  Patient presents with  . History of syncope    History of Present Illness: [name] [name] is a 70 y.o. female last seen in September 2016.She presents for a routine follow-up visit. Since last encounter she has not had any unusual dizziness or syncopal events. She continues on Toprol-XL and has generally done well with history of neurocardiogenic syncope. She does not report any exertional chest pain or breathlessness beyond NYHA class II.  Past Medical History  Diagnosis Date  . GERD (gastroesophageal reflux disease)   . Hyperlipidemia   . Hypertension   . PUD (peptic ulcer disease)   . Gout   . Syncope     Neurocardiogenic    Current Outpatient Prescriptions  Medication Sig Dispense Refill  . colchicine 0.6 MG tablet Take 0.6 mg by mouth daily. As directed    . Fluticasone-Salmeterol (ADVAIR) 500-50 MCG/DOSE AEPB Inhale 1 puff into the lungs every 12 (twelve) hours.    . furosemide (LASIX) 20 MG tablet Take 20 mg by mouth daily.      . hydrocortisone-pramoxine (PROCTOFOAM HC) rectal foam 1 PR BID FOR 10 DAYS 20 g 1  . meclizine (ANTIVERT) 25 MG tablet Take 12.5-25 mg by mouth 3 (three) times daily as needed for dizziness.     . meloxicam (MOBIC) 7.5 MG tablet     . metoprolol succinate (TOPROL-XL) 25 MG 24 hr tablet Take 1 tablet (25 mg total) by mouth daily. 90 tablet 1  . Naproxen Sodium 220 MG CAPS Take 220 mg by mouth daily as needed (pain).     . Neomycin-Bacitracin-Polymyxin (HCA TRIPLE ANTIBIOTIC OINTMENT EX) Apply 1 application topically daily as needed (itching).     Marland Kitchen omeprazole (PRILOSEC) 40 MG capsule Take 40 mg by mouth daily.      . potassium chloride (K-DUR) 10 MEQ tablet Take 20 mEq by mouth 2 (two) times daily.     . simvastatin (ZOCOR) 40 MG tablet Take 40 mg by mouth  daily.      . STOOL SOFTENER & LAXATIVE 8.6-50 MG per tablet Take 1 tablet by mouth daily as needed (constipation).     . triamterene-hydrochlorothiazide (MAXZIDE) 75-50 MG per tablet Take 1 tablet by mouth daily.    . valsartan (DIOVAN) 160 MG tablet Take 160 mg by mouth daily.       No current facility-administered medications for this visit.   Allergies:  Acetaminophen-codeine; Allopurinol; and Tramadol   Social History: The patient  reports that she quit smoking about 18 years ago. Her smoking use included Cigarettes. She started smoking about 52 years ago. She has a 20 pack-year smoking history. She has never used smokeless tobacco. She reports that she does not drink alcohol or use illicit drugs.   ROS:  Please see the history of present illness. Otherwise, complete review of systems is positive for gum disease.  All other systems are reviewed and negative.   Physical Exam: VS:  BP 142/80 mmHg  Pulse 71  Ht 5\' 2"  (1.575 m)  Wt 181 lb (82.101 kg)  BMI 33.10 kg/m2  SpO2 96%, BMI Body mass index is 33.1 kg/(m^2).  Wt Readings from Last 3 Encounters:  03/21/16 181 lb (82.101 kg)  09/19/15 186 lb (84.369 kg)  02/18/15 186 lb (84.369 kg)    Appears comfortable at  rest  HEENT: Conjunctiva and lids normal, oropharynx clear.  Neck: Supple, no elevated JVP or bruits.  Lungs: Clear to auscultation, nonlabored.  Cardiac: Regular rate and rhythm, no S3.  Pharynx: No pitting edema.  Skin: Warm and dry.  ECG: I personally reviewed the prior tracing from 09/19/2015 which showed sinus rhythm with diffuse repolarization abnormalities that are old.  Other Studies Reviewed Today:  Exercise echocardiogram 12/28/2013: Study Conclusions  - Stress: Functional capacity was excellent, >150% of predicted based on age and gender. - Stress ECG conclusions: The stress ECG was equivocal, baseline ST/T changes became more prominent with stress. This may be secondary to cavity obliteration  or hypertensive response in the setting of underlying LVH. - Baseline: LV global systolic function was normal. There is baseline LVH and mitral anular calcification. - Peak stress: LV global systolic function was normal. There was hyperdynamic response of all wall segments with cavity obliteration. Impressions:  - Normal study after maximal exercise.  Assessment and Plan:  1. History of neurocardiogenic syncope, doing well on Toprol-XL with no recurrences since last encounter. We will go to a one-year follow-up pattern.  2. Essential hypertension, also on Maxzide and Diovan. Continue follow-up with Dr. Teryl Lucy.  Current medicines were reviewed with the patient today.  Disposition: FU with me in 1 year.   Signed, Satira Sark, MD, [facility] 03/21/2016 11:40 AM    [facility] [place]. [address] [place], [place] [address] Phone: [phone]; Fax: [phone]

## 2016-03-21 NOTE — Patient Instructions (Signed)

## 2017-01-07 ENCOUNTER — Other Ambulatory Visit: Payer: Self-pay | Admitting: Cardiology

## 2017-01-11 ENCOUNTER — Ambulatory Visit: Payer: Medicare PPO | Admitting: Gastroenterology

## 2017-01-15 ENCOUNTER — Encounter: Payer: Self-pay | Admitting: Orthopedic Surgery

## 2017-01-15 ENCOUNTER — Ambulatory Visit (INDEPENDENT_AMBULATORY_CARE_PROVIDER_SITE_OTHER): Payer: Medicare PPO | Admitting: Orthopedic Surgery

## 2017-01-15 VITALS — BP 130/67 | HR 70 | Ht 62.0 in | Wt 174.0 lb

## 2017-01-15 DIAGNOSIS — M19041 Primary osteoarthritis, right hand: Secondary | ICD-10-CM | POA: Diagnosis not present

## 2017-01-15 NOTE — Patient Instructions (Signed)
TOPICAL ARTHRITIS CREAM AS NEEDED

## 2017-01-15 NOTE — Progress Notes (Signed)
Patient ID: [name] [name], female   DOB: 01-25-1946, 71 y.o.   MRN: [id number]  Chief Complaint  Patient presents with  . Hand Pain    Right hand pain, no injury.    HPI [name] [name] is a 71 y.o. female.  Presents for evaluation of abnormal x-ray of her right hand long finger diagnosed with erosive osteoarthritis of the PIP joint however, she was treated with prednisone for 1 week and the swelling and pain decreased significantly  She presents for evaluation and treatment of ongoing tenderness over the PIP joint.  She reports no trauma  She's had alteration of her kidney function tests and they appear to be aggravated by NSAID use  Review of Systems Review of Systems No shortness of breath or chest pain, no fever   Past Medical History:  Diagnosis Date  . GERD (gastroesophageal reflux disease)   . Gout   . Hyperlipidemia   . Hypertension   . PUD (peptic ulcer disease)   . Syncope    Neurocardiogenic    Past Surgical History:  Procedure Laterality Date  . ABDOMINAL HYSTERECTOMY    . COLONOSCOPY N/A 10/07/2014   Procedure: COLONOSCOPY;  Surgeon: Danie Binder, MD;  [facility]: [facility];  Service: Endoscopy;  Laterality: N/A;  11:30 AM - moved to 11:45 - [place] to notify pt  . LIPOMA RESECTION      Social History Social History  Substance Use Topics  . Smoking status: Former Smoker    Packs/day: 1.00    Years: 20.00    Types: Cigarettes    Start date: 03/16/1964    Quit date: 10/07/1997  . Smokeless tobacco: Never Used  . Alcohol use No    Allergies  Allergen Reactions  . Acetaminophen-Codeine   . Allopurinol   . Tramadol     No outpatient prescriptions have been marked as taking for the 01/15/17 encounter (Office Visit) with Carole Civil, MD.    Physical Exam Physical Exam BP 130/67   Pulse 70   Ht 5\' 2"  (1.575 m)   Wt 174 lb (78.9 kg)   BMI 31.83 kg/m   Gen. appearance. The patient is well-developed and well-nourished, grooming and  hygiene are normal. There are no gross congenital abnormalities  The patient is alert and oriented to person place and time  Mood and affect are normal  Ambulation Is not affected normal,  Examination reveals the following: On inspection we find swelling of the PIP joint of the right long finger with some mild tenderness, she has just a little loss of motion in flexion and extension. The joint is stable flexion extension at the DIP joint is normal in terms of strength  Skin we find no rash ulceration or erythema  Sensation remains intact  Impression vascular system shows no peripheral edema  Left hand shows no abnormality full range of motion no tenderness or swelling  Data Reviewed X-ray was brought in on a disc multiple views and a report is included  There are erosive changes around the PIP and DIP joint of the ring finger  The joint space narrowing is also noted  Assessment    Arthritis of the PIP joint improved with prednisone    Plan    Recommend topical arthritis cream to avoid NSAID, if swelling and pain increases again we can use a short course of oral steroids.       Arther Abbott 01/15/2017, 10:26 AM

## 2017-01-30 ENCOUNTER — Encounter: Payer: Self-pay | Admitting: Gastroenterology

## 2017-01-30 ENCOUNTER — Ambulatory Visit (INDEPENDENT_AMBULATORY_CARE_PROVIDER_SITE_OTHER): Payer: Medicare PPO | Admitting: Gastroenterology

## 2017-01-30 DIAGNOSIS — K625 Hemorrhage of anus and rectum: Secondary | ICD-10-CM

## 2017-01-30 MED ORDER — HYDROCORTISONE 2.5 % RE CREA: 1.0000 "application " | TOPICAL_CREAM | Freq: Two times a day (BID) | RECTAL | 1 refills | Status: DC

## 2017-01-30 NOTE — Patient Instructions (Addendum)
Stop the Linzess. Let's start a new medication called Amitiza. Take one gelcap WITH FOOD TO AVOID NAUSEA twice a day. (take with breakfast and dinner). We don't have any samples of the higher dosage, so I have sent this to the pharmacy. I have also given you 8 mcg samples to try first if needed before picking up the higher dosage of prescription. If the 8 mcg twice a day does not work, you have a prescription waiting for you!  I have sent a cream to your pharmacy to use twice a day for 7 days.   I would like for you to follow-up with Dr. Oneida Alar in 6 weeks or so to discuss your symptoms and see if you need hemorrhoid banding.    Hemorrhoids Introduction Hemorrhoids are swollen veins in and around the rectum or anus. Hemorrhoids can cause pain, itching, or bleeding. Most of the time, they do not cause serious problems. They usually get better with diet changes, lifestyle changes, and other home treatments. Follow these instructions at home: Eating and drinking  Eat foods that have fiber, such as whole grains, beans, nuts, fruits, and vegetables. Ask your doctor about taking products that have added fiber (fibersupplements).  Drink enough fluid to keep your pee (urine) clear or pale yellow. For Pain and Swelling  Take a warm-water bath (sitz bath) for 20 minutes to ease pain. Do this 3-4 times a day.  If directed, put ice on the painful area. It may be helpful to use ice between your warm baths.  Put ice in a plastic bag.  Place a towel between your skin and the bag.  Leave the ice on for 20 minutes, 2-3 times a day. General instructions  Take over-the-counter and prescription medicines only as told by your doctor.  Medicated creams and medicines that are inserted into the anus (suppositories) may be used or applied as told.  Exercise often.  Go to the bathroom when you have the urge to poop (to have a bowel movement). Do not wait.  Avoid pushing too hard (straining) when you  poop.  Keep the butt area dry and clean. Use wet toilet paper or moist paper towels.  Do not sit on the toilet for a long time. Contact a doctor if:  You have any of these:  Pain and swelling that do not get better with treatment or medicine.  Bleeding that will not stop.  Trouble pooping or you cannot poop.  Pain or swelling outside the area of the hemorrhoids. This information is not intended to replace advice given to you by your health care provider. Make sure you discuss any questions you have with your health care provider. Document Released: 09/18/2008 Document Revised: 05/17/2016 Document Reviewed: 08/24/2015  2017 Elsevier

## 2017-01-30 NOTE — Progress Notes (Signed)
Referring Provider: Andres Shad, * Primary Care Physician:  Andres Shad, MD Primary GI: Dr. Oneida Alar   Chief Complaint  Patient presents with  . Hemorrhoids    HPI:   [name] [name] is a 71 y.o. female presenting today with a history of external hemorrhoids, last colonoscopy 2015. Stays constipated. Rectal discomfort noted. Occasional paper hematochezia. A little "twang" of abdominal discomfort with constipation.   Linzess 290 mcg for constipation but hasn't noticed a huge difference. Makes stool softer but has a BM every 2-3 days. Has had to take laxatives, mag citrate. Straining.   Past Medical History:  Diagnosis Date  . GERD (gastroesophageal reflux disease)   . Gout   . Hyperlipidemia   . Hypertension   . PUD (peptic ulcer disease)   . Syncope    Neurocardiogenic    Past Surgical History:  Procedure Laterality Date  . ABDOMINAL HYSTERECTOMY    . COLONOSCOPY N/A 10/07/2014   Dr. Oneida Alar: redundant left colon, moderate sized external hemorrhoids   . LIPOMA RESECTION      Current Outpatient Prescriptions  Medication Sig Dispense Refill  . colchicine 0.6 MG tablet Take 0.6 mg by mouth daily. As directed    . Fluticasone-Salmeterol (ADVAIR) 500-50 MCG/DOSE AEPB Inhale 1 puff into the lungs every 12 (twelve) hours.    . furosemide (LASIX) 20 MG tablet Take 20 mg by mouth daily.      Marland Kitchen LINZESS 290 MCG CAPS capsule     . meclizine (ANTIVERT) 25 MG tablet Take 12.5-25 mg by mouth 3 (three) times daily as needed for dizziness.     . meloxicam (MOBIC) 7.5 MG tablet     . metoprolol succinate (TOPROL-XL) 25 MG 24 hr tablet TAKE 1 TABLET EVERY DAY 90 tablet 3  . Naproxen Sodium 220 MG CAPS Take 220 mg by mouth daily as needed (pain).     Marland Kitchen omeprazole (PRILOSEC) 40 MG capsule Take 40 mg by mouth daily.      . potassium chloride (K-DUR) 10 MEQ tablet Take 20 mEq by mouth 2 (two) times daily.     . simvastatin (ZOCOR) 40 MG tablet Take 40 mg by mouth daily.       Marland Kitchen triamterene-hydrochlorothiazide (MAXZIDE) 75-50 MG per tablet Take 1 tablet by mouth daily.    . valsartan (DIOVAN) 160 MG tablet Take 160 mg by mouth daily.       No current facility-administered medications for this visit.     Allergies as of 01/30/2017 - Review Complete 01/30/2017  Allergen Reaction Noted  . Acetaminophen-codeine    . Allopurinol    . Tramadol      Family History  Problem Relation Age of Onset  . Stroke Mother   . Dementia Father     Social History   Social History  . Marital status: Single    Spouse name: N/A  . Number of children: N/A  . Years of education: N/A   Occupational History  . Retired    Social History Main Topics  . Smoking status: Former Smoker    Packs/day: 1.00    Years: 20.00    Types: Cigarettes    Start date: 03/16/1964    Quit date: 10/07/1997  . Smokeless tobacco: Never Used  . Alcohol use No  . Drug use: No  . Sexual activity: Yes    Partners: Male   Other Topics Concern  . None   Social History Narrative  . None    Review of  Systems: As mentioned in HPI   Physical Exam: BP (!) 146/79   Pulse 70   Temp 98.1 F (36.7 C) (Oral)   Ht 5\' 2"  (1.575 m)   Wt 174 lb (78.9 kg)   BMI 31.83 kg/m  General:   Alert and oriented. No distress noted. Pleasant and cooperative.  Head:  Normocephalic and atraumatic. Eyes:  Conjuctiva clear without scleral icterus. Mouth:  Oral mucosa pink and moist. Good dentition. No lesions. Abdomen:  +BS, soft, non-tender and non-distended. No rebound or guarding. No HSM or masses noted. Rectal: small external hemorrhoid tag, no thrombosed external hemorrhoids, internal exam with slight discomfort, no gross blood  Extremities:  Without edema. Neurologic:  Alert and  oriented x4 Psych:  Alert and cooperative. Normal mood and affect.

## 2017-01-30 NOTE — Assessment & Plan Note (Signed)
Likely due to internal hemorrhoids in setting of straining, constipation. External rectal exam without significant findings; internal exam with some discomfort but no mass. No gross blood noted on exam. Linzess 290 mcg not working well. Start samples of Amitiza 8 mcg po BID. I suspect she will need higher dosing, but we do not have these samples, so I have sent those to the pharmacy to pick up if the 53mcg do not work. Return in 6 weeks to see Dr. Oneida Alar and discuss possible hemorrhoid banding. Behavior modification discussed. Anusol cream sent to pharmacy.

## 2017-01-31 NOTE — Progress Notes (Signed)
CC'D TO PCP °

## 2017-02-11 ENCOUNTER — Telehealth: Payer: Self-pay | Admitting: Gastroenterology

## 2017-02-11 ENCOUNTER — Telehealth: Payer: Self-pay | Admitting: General Practice

## 2017-02-11 MED ORDER — LUBIPROSTONE 24 MCG PO CAPS: 24.0000 ug | ORAL_CAPSULE | Freq: Two times a day (BID) | ORAL | 3 refills | Status: DC

## 2017-02-11 NOTE — Addendum Note (Signed)
Addended by: Annitta Needs on: 02/11/2017 01:41 PM   Modules accepted: Orders

## 2017-02-11 NOTE — Telephone Encounter (Signed)
Done

## 2017-02-11 NOTE — Telephone Encounter (Signed)
Patient called in and wanted to let Vicente Males know that the Amitiza 8 mcg worked better than the [employer], however she thinks the Amitiza 24 mcg would give her better results.  She said [facility] can [facility] that [facility] [place] [place] [facility] [address].

## 2017-02-11 NOTE — Telephone Encounter (Signed)
I called to discuss with pt. No answer.

## 2017-02-11 NOTE — Telephone Encounter (Signed)
Patient called and stated the medication she was given here is working but she may need a higher dosage.

## 2017-02-12 NOTE — Telephone Encounter (Signed)
Pt said the Amitiza 8 mcg was not working well. I told her that Roseanne Kaufman, NP sent in the prescription for the 24 mcg yesterday. She will get those and let us know how she does.

## 2017-02-13 ENCOUNTER — Telehealth: Payer: Self-pay | Admitting: Gastroenterology

## 2017-02-13 NOTE — Telephone Encounter (Signed)
PT CALLED AND STATED THAT [facility] [place] [facility] [address].

## 2017-02-13 NOTE — Telephone Encounter (Signed)
[facility] and gave her the order for the Amitiza 24 mcg per Vicente Males [facility] order.  Pt is aware.

## 2017-03-14 ENCOUNTER — Ambulatory Visit: Payer: Medicare PPO | Admitting: Gastroenterology

## 2017-03-19 NOTE — Progress Notes (Signed)
Cardiology Office Note  Date: 03/21/2017   ID: [name], [name] April 12, 1946, MRN [id number]  PCP: Andres Shad, MD  Primary Cardiologist: Rozann Lesches, MD   Chief Complaint  Patient presents with  . History of syncope    History of Present Illness: [name] [name] is a 71 y.o. female last seen in March 2017. She presents for a routine follow-up visit. Since last encounter she has had no dizziness or syncope. States that she is providing care for her long-term companion who is declining with Alzheimer's dementia. Has been under a lot of stress.  I personally reviewed her ECG today which shows sinus rhythm with diffuse repolarization abnormalities.  I reviewed her current medications which are outlined below.  Past Medical History:  Diagnosis Date  . GERD (gastroesophageal reflux disease)   . Gout   . Hyperlipidemia   . Hypertension   . PUD (peptic ulcer disease)   . Syncope    Neurocardiogenic    Current Outpatient Prescriptions  Medication Sig Dispense Refill  . colchicine 0.6 MG tablet Take 0.6 mg by mouth daily. As directed    . Fluticasone-Salmeterol (ADVAIR) 500-50 MCG/DOSE AEPB Inhale 1 puff into the lungs every 12 (twelve) hours.    . furosemide (LASIX) 20 MG tablet Take 20 mg by mouth daily.      . hydrocortisone (ANUSOL-HC) 2.5 % rectal cream Place 1 application rectally 2 (two) times daily. 30 g 1  . LINZESS 290 MCG CAPS capsule     . lubiprostone (AMITIZA) 24 MCG capsule Take 1 capsule (24 mcg total) by mouth 2 (two) times daily with a meal. 60 capsule 3  . meclizine (ANTIVERT) 25 MG tablet Take 12.5-25 mg by mouth 3 (three) times daily as needed for dizziness.     . meloxicam (MOBIC) 7.5 MG tablet     . metoprolol succinate (TOPROL-XL) 25 MG 24 hr tablet TAKE 1 TABLET EVERY DAY 90 tablet 3  . Naproxen Sodium 220 MG CAPS Take 220 mg by mouth daily as needed (pain).     Marland Kitchen omeprazole (PRILOSEC) 40 MG capsule Take 40 mg by mouth daily.      .  potassium chloride (K-DUR) 10 MEQ tablet Take 20 mEq by mouth 2 (two) times daily.     . simvastatin (ZOCOR) 40 MG tablet Take 40 mg by mouth daily.      Marland Kitchen triamterene-hydrochlorothiazide (MAXZIDE) 75-50 MG per tablet Take 1 tablet by mouth daily.    . valsartan (DIOVAN) 160 MG tablet Take 160 mg by mouth daily.       No current facility-administered medications for this visit.    Allergies:  Acetaminophen-codeine; Allopurinol; and Tramadol   Social History: The patient  reports that she quit smoking about 19 years ago. Her smoking use included Cigarettes. She started smoking about 53 years ago. She has a 20.00 pack-year smoking history. She has never used smokeless tobacco. She reports that she does not drink alcohol or use drugs.   ROS:  Please see the history of present illness. Otherwise, complete review of systems is positive for none.  All other systems are reviewed and negative.   Physical Exam: VS:  BP 130/68   Pulse 64   Ht 5\' 2"  (1.575 m)   Wt 169 lb (76.7 kg)   SpO2 98%   BMI 30.91 kg/m , BMI Body mass index is 30.91 kg/m.  Wt Readings from Last 3 Encounters:  03/21/17 169 lb (76.7 kg)  01/30/17 174 lb (  78.9 kg)  01/15/17 174 lb (78.9 kg)    Appears comfortable at rest  HEENT: Conjunctiva and lids normal, oropharynx clear.  Neck: Supple, no elevated JVP or bruits.  Lungs: Clear to auscultation, nonlabored.  Cardiac: Regular rate and rhythm, no S3.  Pharynx: No pitting edema.  Skin: Warm and dry.  ECG: I personally reviewed the tracing from,09/19/2015 which showed sinus rhythm with diffuse repolarization abnormalities that are old.  Other Studies Reviewed Today:  Exercise echocardiogram 12/28/2013: Study Conclusions  - Stress: Functional capacity was excellent, >150% of predicted based on age and gender. - Stress ECG conclusions: The stress ECG was equivocal, baseline ST/T changes became more prominent with stress. This may be secondary to cavity  obliteration or hypertensive response in the setting of underlying LVH. - Baseline: LV global systolic function was normal. There is baseline LVH and mitral anular calcification. - Peak stress: LV global systolic function was normal. There was hyperdynamic response of all wall segments with cavity obliteration. Impressions:  - Normal study after maximal exercise.  Assessment and Plan:  1. History of neurocardiogenic syncope, asymptomatic on beta blocker. Continue with observation.  2. Essential hypertension, systolic in the 353P. She continues to follow with Dr. Teryl Lucy.  Current medicines were reviewed with the patient today.   Orders Placed This Encounter  Procedures  . EKG 12-Lead    Disposition: Follow-up in one year.  Signed, Satira Sark, MD, [facility] 03/21/2017 11:56 AM    [facility] [place]. [address], [place], [place] [address] Phone: [phone]; Fax: [phone]

## 2017-03-21 ENCOUNTER — Ambulatory Visit (INDEPENDENT_AMBULATORY_CARE_PROVIDER_SITE_OTHER): Payer: Medicare PPO | Admitting: Cardiology

## 2017-03-21 ENCOUNTER — Encounter: Payer: Self-pay | Admitting: Cardiology

## 2017-03-21 VITALS — BP 130/68 | HR 64 | Ht 62.0 in | Wt 169.0 lb

## 2017-03-21 DIAGNOSIS — I10 Essential (primary) hypertension: Secondary | ICD-10-CM | POA: Diagnosis not present

## 2017-03-21 DIAGNOSIS — R55 Syncope and collapse: Secondary | ICD-10-CM | POA: Diagnosis not present

## 2017-03-21 NOTE — Patient Instructions (Signed)
Your physician wants you to follow-up in: 1 year You will receive a reminder letter in the mail two months in advance. If you don't receive a letter, please call our office to schedule the follow-up appointment.   Your physician recommends that you continue on your current medications as directed. Please refer to the Current Medication list given to you today.    If you need a refill on your cardiac medications before your next appointment, please call your pharmacy.      Thank you for choosing [facility] !        

## 2017-04-05 ENCOUNTER — Telehealth: Payer: Self-pay | Admitting: Orthopedic Surgery

## 2017-04-05 ENCOUNTER — Other Ambulatory Visit: Payer: Self-pay | Admitting: Orthopedic Surgery

## 2017-04-05 MED ORDER — PREDNISONE 10 MG (48) PO TBPK: ORAL_TABLET | Freq: Every day | ORAL | 1 refills | Status: DC

## 2017-04-05 NOTE — Telephone Encounter (Addendum)
Requesting a prescription for Prednisone.  Stated that when she saw Dr. Aline Brochure for her R ring finger, he had told her if it flaired up to just call for a prescription for Prednisone. Her last visit was 01/15/17.  [facility] [place] [facility] [address]

## 2017-04-05 NOTE — Telephone Encounter (Signed)
ROUTING TO DR HARRISON FOR APPROVAL 

## 2017-04-18 ENCOUNTER — Other Ambulatory Visit: Payer: Self-pay | Admitting: Gastroenterology

## 2017-05-02 ENCOUNTER — Ambulatory Visit (INDEPENDENT_AMBULATORY_CARE_PROVIDER_SITE_OTHER): Payer: Medicare PPO | Admitting: Gastroenterology

## 2017-05-02 ENCOUNTER — Encounter: Payer: Self-pay | Admitting: Gastroenterology

## 2017-05-02 DIAGNOSIS — K625 Hemorrhage of anus and rectum: Secondary | ICD-10-CM | POA: Diagnosis not present

## 2017-05-02 NOTE — Progress Notes (Signed)
ON RECALL  °

## 2017-05-02 NOTE — Progress Notes (Signed)
PLEASE CALL PT. IF HER KIDNEY FUNCTION IS NOT NORMAL SHE SHOULD NEVER USE MOBIC AND NAPROXEN IN THE SAME DAY. USE PREPARATION H FOUR TIMES  A DAY IF NEEDED TO RELIEVE RECTAL PAIN/PRESSURE/BLEEDING.

## 2017-05-02 NOTE — Assessment & Plan Note (Signed)
LAST TCS 2015 TO THE [facility] WITH FAIR TO GOOD PREP. TCS REPORT IS INCORRECT IN THE PROCEDURE TECHNIQUE. EXTERNAL HEMORRHOIDS NOTED.  REVIEWED TCS REPORT WITH PATIENT. DRINK WATER TO KEEP YOUR URINE LIGHT YELLOW. FOLLOW A HIGH FIBER DIET. AVOID ITEMS THAT CAUSE BLOATING & GAS. DISCUSSED WITH PATIENT. USE PREPARATION H FOUR TIMES  A DAY IF NEEDED TO RELIEVE RECTAL PAIN/PRESSURE/BLEEDING. CONTINUE AMITIZA. FOLLOW UP IN 6-12 MOS.  PLEASE CALL WITH QUESTIONS OR CONCERNS.

## 2017-05-02 NOTE — Progress Notes (Signed)
PT is aware.

## 2017-05-02 NOTE — Progress Notes (Signed)
Tried to call. Many rings and no answer.  

## 2017-05-02 NOTE — Progress Notes (Signed)
Subjective:    Patient ID: [name] [name], female    DOB: [birth year], 71 y.o.   MRN: [id number]  Andres Shad, MD   HPI PT FEELS BETTER. NO RECTAL BLEEDING. CONSTIPATION IMPROVED WITH AMITIZA. HAS A BM EVERY 3 DAYS. LOST WEIGHT SINCE LAST VISIT. TAKING CARE OF A FRIEND WITH ALZHEIMER'S AND HE COMES TO HER HOUSE DURING THE DAY AND SHE STAYS AT HIS HOUSE AT NIGHT BECAUSE HE REFUSES TO STAY AT [place]. IT IS STRESSFUL. OCCASIONALLY HAS HELP SO SHE CAN GET SOME RELIEF.NO BRBPR OR MELENA. CHILDREN.  PT DENIES FEVER, CHILLS, HEMATOCHEZIA, HEMATEMESIS, nausea, vomiting, melena, diarrhea, CHEST PAIN, SHORTNESS OF BREATH,  CHANGE IN BOWEL IN HABITS, abdominal pain, problems swallowing, OR heartburn or indigestion.  Past Medical History:  Diagnosis Date  . GERD (gastroesophageal reflux disease)   . Gout   . Hyperlipidemia   . Hypertension   . PUD (peptic ulcer disease)   . Syncope    Neurocardiogenic    Past Surgical History:  Procedure Laterality Date  . ABDOMINAL HYSTERECTOMY    . COLONOSCOPY N/A 10/07/2014   Dr. Oneida Alar: redundant left colon, moderate sized external hemorrhoids   . LIPOMA RESECTION      Allergies  Allergen Reactions  . Acetaminophen-Codeine   . Allopurinol   . Tramadol     Current Outpatient Prescriptions  Medication Sig Dispense Refill  . AMITIZA 24 MCG capsule TAKE 1 CAPSULE TWICE DAILY WITH A MEAL    . Fluticasone-Salmeterol (ADVAIR) 500-50 MCG/DOSE AEPB Inhale 1 puff into the lungs every 12 (twelve) hours.    . furosemide (LASIX) 20 MG tablet Take 20 mg by mouth daily.      .      . meclizine (ANTIVERT) 25 MG tablet Take 12.5-25 mg by mouth 3 (three) times daily as needed for dizziness.     . meloxicam (MOBIC) 7.5 MG tablet Take 7.5 mg by mouth as needed.     . TOPROL-XL 25 MG 24 hr tablet TAKE 1 TABLET EVERY DAY    . Naproxen Sodium 220 MG CAPS Take 220 mg by mouth daily as needed (pain).     Marland Kitchen omeprazole (PRILOSEC) 40 MG capsule Take 40 mg  by mouth daily.      Marland Kitchen OVER THE COUNTER MEDICATION Fleets enema as needed    . potassium chloride (K-DUR) 10 MEQ tablet Take 20 mEq by mouth 2 (two) times daily.     . simvastatin (ZOCOR) 40 MG tablet Take 40 mg by mouth daily.      Marland Kitchen triamterene-hydrochlorothiazide (MAXZIDE) 75-50 MG per tablet Take 1 tablet by mouth daily.    . colchicine 0.6 MG tablet Take 0.6 mg by mouth daily. As directed          .      . valsartan (DIOVAN) 160 MG tablet Take 160 mg by mouth daily.       Review of Systems PER HPI OTHERWISE ALL SYSTEMS ARE NEGATIVE.    Objective:   Physical Exam  Constitutional: She is oriented to person, place, and time. She appears well-developed and well-nourished. No distress.  HENT:  Head: Normocephalic and atraumatic.  Mouth/Throat: Oropharynx is clear and moist. No oropharyngeal exudate.  HIRSUITISM PRESENT  Eyes: Pupils are equal, round, and reactive to light. No scleral icterus.  Neck: Normal range of motion. Neck supple.  Cardiovascular: Normal rate, regular rhythm and normal heart sounds.   Pulmonary/Chest: Effort normal and breath sounds normal. No respiratory distress.  Abdominal:  Soft. Bowel sounds are normal. She exhibits no distension. There is no tenderness.  Musculoskeletal: She exhibits no edema.  Lymphadenopathy:    She has no cervical adenopathy.  Neurological: She is alert and oriented to person, place, and time.  Psychiatric: She has a normal mood and affect.  Vitals reviewed.     Assessment & Plan:

## 2017-05-02 NOTE — Patient Instructions (Signed)
DRINK WATER TO KEEP YOUR URINE LIGHT YELLOW.  FOLLOW A HIGH FIBER DIET. AVOID ITEMS THAT CAUSE BLOATING & GAS. SEE INFO BELOW.  CONTINUE AMITIZA.  FOLLOW UP IN 6-12 MOS.  PLEASE CALL WITH QUESTIONS OR CONCERNS.

## 2017-05-03 NOTE — Progress Notes (Signed)
cc'ed to pcp °

## 2017-06-25 ENCOUNTER — Other Ambulatory Visit: Payer: Self-pay | Admitting: Gastroenterology

## 2017-07-17 ENCOUNTER — Encounter: Payer: Self-pay | Admitting: Nurse Practitioner

## 2017-07-17 ENCOUNTER — Ambulatory Visit (INDEPENDENT_AMBULATORY_CARE_PROVIDER_SITE_OTHER): Payer: Medicare PPO | Admitting: Gastroenterology

## 2017-07-17 DIAGNOSIS — K5900 Constipation, unspecified: Secondary | ICD-10-CM

## 2017-07-17 HISTORY — DX: Constipation, unspecified: K59.00

## 2017-07-17 NOTE — Progress Notes (Signed)
Referring Provider: Andres Shad, * Primary Care Physician:  Andres Shad, MD Primary GI: Dr. Oneida Alar   Chief Complaint  Patient presents with  . Constipation    HPI:   [name] [name] is a 71 y.o. female presenting today with a history of constipation, rectal bleeding. Last seen 04/2017 with Dr. Oneida Alar.   Was seen at [facility] and had soap suds enema due to impaction mid July 2018. Had been without Amitiza for 2 weeks due to refill issues. Had been having a BM once every other day even on Amitiza. Given Miralax and Colace in the ED. BM once a day now. No rectal discomfort since being disimpacted.   On antibiotics now for mouth infection. Dental extraction scheduled for end of August. Caring for her boyfriend who has Alzheimer's. Weight trending down. Appetite is not that great. Appt with PCP on 8/16.   Vitals - 1 value per visit 07/17/2017 05/02/2017 03/21/2017 01/30/2017 01/15/2017  Weight (lb) 153.6 166.4 169 174 174   Vitals - 1 value per visit 03/21/2016  Weight (lb) 181    Past Medical History:  Diagnosis Date  . GERD (gastroesophageal reflux disease)   . Gout   . Hyperlipidemia   . Hypertension   . PUD (peptic ulcer disease)   . Syncope    Neurocardiogenic    Past Surgical History:  Procedure Laterality Date  . ABDOMINAL HYSTERECTOMY    . COLONOSCOPY N/A 10/07/2014   Dr. Oneida Alar: redundant left colon, moderate sized external hemorrhoids   . LIPOMA RESECTION      Current Outpatient Prescriptions  Medication Sig Dispense Refill  . AMITIZA 24 MCG capsule TAKE 1 CAPSULE TWICE DAILY WITH A MEAL 180 capsule 3  . colchicine 0.6 MG tablet Take 0.6 mg by mouth daily. As directed    . Fluticasone-Salmeterol (ADVAIR) 500-50 MCG/DOSE AEPB Inhale 1 puff into the lungs every 12 (twelve) hours.    . furosemide (LASIX) 20 MG tablet Take 20 mg by mouth daily.      . hydrocortisone (PROCTOSOL HC) 2.5 % rectal cream Place rectally 2 (two) times daily. No more  than 14 days at a time. 28.35 g 1  . meclizine (ANTIVERT) 25 MG tablet Take 12.5-25 mg by mouth 3 (three) times daily as needed for dizziness.     . meloxicam (MOBIC) 7.5 MG tablet Take 7.5 mg by mouth as needed.     . metoprolol succinate (TOPROL-XL) 25 MG 24 hr tablet TAKE 1 TABLET EVERY DAY 90 tablet 3  . Naproxen Sodium 220 MG CAPS Take 220 mg by mouth daily as needed (pain).     Marland Kitchen omeprazole (PRILOSEC) 40 MG capsule Take 40 mg by mouth daily.      Marland Kitchen OVER THE COUNTER MEDICATION Fleets enema as needed    . penicillin v potassium (VEETID) 500 MG tablet     . potassium chloride (K-DUR) 10 MEQ tablet Take 20 mEq by mouth 2 (two) times daily.     . simvastatin (ZOCOR) 40 MG tablet Take 40 mg by mouth daily.      Marland Kitchen triamterene-hydrochlorothiazide (MAXZIDE) 75-50 MG per tablet Take 1 tablet by mouth daily.    . valsartan (DIOVAN) 160 MG tablet Take 160 mg by mouth daily.      Marland Kitchen LINZESS 290 MCG CAPS capsule     . predniSONE (STERAPRED UNI-PAK 48 TAB) 10 MG (48) TBPK tablet Take by mouth daily. 10 MG DS 12 DAYS AS DIRECTED (Patient not taking:  Reported on 05/02/2017) 48 tablet 1   No current facility-administered medications for this visit.     Allergies as of 07/17/2017 - Review Complete 07/17/2017  Allergen Reaction Noted  . Acetaminophen-codeine    . Allopurinol    . Tramadol      Family History  Problem Relation Age of Onset  . Stroke Mother   . Dementia Father   . Cancer - Other Sister     Social History   Social History  . Marital status: Single    Spouse name: N/A  . Number of children: N/A  . Years of education: N/A   Occupational History  . Retired    Social History Main Topics  . Smoking status: Former Smoker    Packs/day: 1.00    Years: 20.00    Types: Cigarettes    Start date: 03/16/1964    Quit date: 10/07/1997  . Smokeless tobacco: Never Used  . Alcohol use No  . Drug use: No  . Sexual activity: Yes    Partners: Male   Other Topics Concern  . None    Social History Narrative  . None    Review of Systems: As mentioned in HPI   Physical Exam: BP 134/71   Pulse 64   Temp 97.8 F (36.6 C) (Oral)   Ht 5\' 2"  (1.575 m)   Wt 153 lb 9.6 oz (69.7 kg)   BMI 28.09 kg/m  General:   Alert and oriented. No distress noted. Pleasant and cooperative.  Head:  Normocephalic and atraumatic. Eyes:  Conjuctiva clear without scleral icterus. Mouth:  Oral mucosa pink and moist.  Abdomen:  +BS, soft, non-tender and non-distended. No rebound or guarding. No HSM or masses noted. Msk:  Symmetrical without gross deformities. Normal posture. Extremities:  Without edema. Neurologic:  Alert and  oriented x4 Psych:  Alert and cooperative. Normal mood and affect.

## 2017-07-17 NOTE — Assessment & Plan Note (Addendum)
71 year old female with constipation managed fairly well by Amitiza 24 mcg BID historically, with acute episode of impaction in setting of missing several weeks of Amitiza. She was seen at an outside hospital [facility]) and reports imaging was done. Doing well with addition of colace and miralax to Amitiza BID. Will have her continue Amitiza BID, Miralax each evening, titrating as needed. Obtain outside notes from [place].   As of note, she has had a steady weight loss over the past year with marked weight loss since May of 13 lbs; however, she is dealing with dental issues and on antibiotics, with need for extraction in August. We will have her return in 3 months for weight check. Keep appt with PCP in several weeks.

## 2017-07-17 NOTE — Patient Instructions (Signed)
Continue the Amitiza twice a day with food. Take Miralax every evening to every other evening.  I am requesting the CT reports from [facility].  We will see you in 3 months to check on your weight and how you are doing.

## 2017-07-18 NOTE — Progress Notes (Signed)
cc'ed to pcp °

## 2017-07-28 NOTE — Progress Notes (Signed)
REVIEWED-NO ADDITIONAL RECOMMENDATIONS. 

## 2017-08-14 NOTE — Progress Notes (Signed)
Received imaging from [facility] dated July 2018: this was an abdominal xray noting copious fecal material in colon. No evidence of obstruction.

## 2017-10-17 ENCOUNTER — Telehealth: Payer: Self-pay | Admitting: Gastroenterology

## 2017-10-17 ENCOUNTER — Encounter: Payer: Self-pay | Admitting: Gastroenterology

## 2017-10-17 ENCOUNTER — Ambulatory Visit: Payer: Medicare PPO | Admitting: Gastroenterology

## 2017-10-17 NOTE — Telephone Encounter (Signed)
Patient was a no show and letter sent  °

## 2017-11-20 ENCOUNTER — Telehealth: Payer: Self-pay | Admitting: Orthopedic Surgery

## 2017-11-20 MED ORDER — PREDNISONE 10 MG (21) PO TBPK: ORAL_TABLET | ORAL | 0 refills | Status: DC

## 2017-11-20 NOTE — Telephone Encounter (Signed)
Patient called to request refill on Prednisone - pharmacy is [facility] on [address], [place]  - I discussed scheduling appointment also, and offered first available, as it was on 01/15/17 (almost 1 year ago) that patient was seen. 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 (ph# [phone])

## 2017-11-20 NOTE — Telephone Encounter (Signed)
Sent in for her / left message to advise

## 2017-11-20 NOTE — Telephone Encounter (Signed)
Patient was prescribed prednisone taper (sterapred) 04/05/17, she has called for refill, last visit with you was in Jan for hand arthritis.   Are you comfortable refilling this for her?

## 2017-11-20 NOTE — Telephone Encounter (Signed)
Yes go ahead

## 2017-11-27 ENCOUNTER — Telehealth: Payer: Self-pay | Admitting: Orthopedic Surgery

## 2017-11-27 NOTE — Telephone Encounter (Signed)
Patient called saying that the Prednisone did real well. Her hand has went down a great deal, but it is a little sore and a little puffy. She just wanted to know if there was anything else she needed to do.  Please call and advise

## 2017-11-27 NOTE — Telephone Encounter (Signed)
Do we have anywhere to put her on the schedule?

## 2017-12-04 ENCOUNTER — Ambulatory Visit: Payer: Medicare PPO | Admitting: Gastroenterology

## 2018-01-06 ENCOUNTER — Other Ambulatory Visit: Payer: Self-pay | Admitting: Gastroenterology

## 2018-01-29 ENCOUNTER — Encounter: Payer: Self-pay | Admitting: Gastroenterology

## 2018-01-29 ENCOUNTER — Ambulatory Visit: Payer: Medicare PPO | Admitting: Gastroenterology

## 2018-01-29 VITALS — BP 142/75 | HR 68 | Temp 97.4°F | Ht 62.0 in | Wt 154.6 lb

## 2018-01-29 DIAGNOSIS — K5900 Constipation, unspecified: Secondary | ICD-10-CM | POA: Diagnosis not present

## 2018-01-29 DIAGNOSIS — K219 Gastro-esophageal reflux disease without esophagitis: Secondary | ICD-10-CM

## 2018-01-29 NOTE — Patient Instructions (Signed)
1. Please keep a check on your weight couple of times per month. You can weight at home or drop by here to be weighed. If more than five pound weight loss, please let me know.  2. Continue omeprazole and Amitiza.  3. Return to the office in six months.

## 2018-01-29 NOTE — Assessment & Plan Note (Signed)
Continue PPI as needed. 

## 2018-01-29 NOTE — Assessment & Plan Note (Signed)
Doing well on Amitiza 56mcg BID. Continue as long as needed. Return to the office in six months.   Previous steady weight loss over 2018 in setting of dental issues and loss of appetite. Patient reports she got down to 140 range but her dental issues have been addressed and her appetite is much better. She has gained some weight. Now couple of pounds heavier overall since 06/2017. She will continue to monitor her weight twice monthly.

## 2018-01-29 NOTE — Progress Notes (Signed)
Primary Care Physician: Andres Shad, MD  Primary Gastroenterologist:  Barney Drain, MD   Chief Complaint  Patient presents with  . Constipation    improved. BM's about 2 per day    HPI: [name] [name] is a 72 y.o. female here for follow-up constipation.  Last seen in July.  Colonoscopy October 2015 with redundant left colon, moderate sized external hemorrhoids.  Historically has been managed with Amitiza 24 mcg twice daily, MiraLAX each evening with titration as needed.  Since she has been back on Amitiza without interruption, her constipation is well managed. She has BM most days. Only occasional rectal bleeding when her hemorrhoids are out. Linzess did not work well for her previously. Heartburn well controlled on omeprazole. No dysphagia, abd pain, vomiting.   Her weight loss has been a concern. Last year her weight dropped from 174 pounds down into the 140 pound range (per patient). Our last documented weight was 153.6 07/17/17. She is now 154.6. Her appetite is improved. Her dental issues have been addressed.     Current Outpatient Medications  Medication Sig Dispense Refill  . AMITIZA 24 MCG capsule TAKE 1 CAPSULE TWICE DAILY WITH A MEAL 180 capsule 3  . Febuxostat (ULORIC PO) Take 1 tablet by mouth daily.    . Fluticasone-Salmeterol (ADVAIR) 500-50 MCG/DOSE AEPB Inhale 1 puff into the lungs every 12 (twelve) hours.    . furosemide (LASIX) 20 MG tablet Take 20 mg by mouth daily.      . hydrocortisone (PROCTOSOL HC) 2.5 % rectal cream Place rectally 2 (two) times daily. No more than 14 days at a time. (Patient taking differently: Place rectally 2 (two) times daily. No more than 14 days at a time. AS NEEDED) 28.35 g 1  . meclizine (ANTIVERT) 25 MG tablet Take 12.5-25 mg by mouth 3 (three) times daily as needed for dizziness.     . meloxicam (MOBIC) 7.5 MG tablet Take 7.5 mg by mouth as needed.     . metoprolol succinate (TOPROL-XL) 25 MG 24 hr tablet TAKE 1 TABLET  EVERY DAY 90 tablet 3  . Naproxen Sodium 220 MG CAPS Take 220 mg by mouth daily as needed (pain).     Marland Kitchen omeprazole (PRILOSEC) 40 MG capsule Take 40 mg by mouth daily.      Marland Kitchen OVER THE COUNTER MEDICATION Fleets enema as needed    . potassium chloride (K-DUR) 10 MEQ tablet Take 20 mEq by mouth 2 (two) times daily.     . simvastatin (ZOCOR) 40 MG tablet Take 40 mg by mouth daily.      Marland Kitchen triamterene-hydrochlorothiazide (MAXZIDE) 75-50 MG per tablet Take 1 tablet by mouth daily.     No current facility-administered medications for this visit.     Allergies as of 01/29/2018 - Review Complete 01/29/2018  Allergen Reaction Noted  . Acetaminophen-codeine    . Allopurinol    . Tramadol      ROS:  General: Negative for anorexia, weight loss, fever, chills, fatigue, weakness. ENT: Negative for hoarseness, difficulty swallowing , nasal congestion. CV: Negative for chest pain, angina, palpitations, dyspnea on exertion, peripheral edema.  Respiratory: Negative for dyspnea at rest, dyspnea on exertion, cough, sputum, wheezing.  GI: See history of present illness. GU:  Negative for dysuria, hematuria, urinary incontinence, urinary frequency, nocturnal urination.  Endo: Negative for unusual weight change.    Physical Examination:   BP (!) 142/75   Pulse 68   Temp (!) 97.4 F (  36.3 C) (Oral)   Ht 5\' 2"  (1.575 m)   Wt 154 lb 9.6 oz (70.1 kg)   BMI 28.28 kg/m   General: Well-nourished, well-developed in no acute distress.  Eyes: No icterus. Mouth: Oropharyngeal mucosa moist and pink , no lesions erythema or exudate. Lungs: Clear to auscultation bilaterally.  Heart: Regular rate and rhythm, no murmurs rubs or gallops.  Abdomen: Bowel sounds are normal, nontender, nondistended, no hepatosplenomegaly or masses, no abdominal bruits or hernia , no rebound or guarding.   Extremities: No lower extremity edema. No clubbing or deformities. Neuro: Alert and oriented x 4   Skin: Warm and dry, no  jaundice.   Psych: Alert and cooperative, normal mood and affect.   Imaging Studies: No results found.

## 2018-01-29 NOTE — Progress Notes (Signed)
CC'D TO PCP °

## 2018-03-24 ENCOUNTER — Ambulatory Visit: Payer: Medicare PPO | Admitting: Cardiology

## 2018-03-24 ENCOUNTER — Encounter: Payer: Self-pay | Admitting: Cardiology

## 2018-03-24 VITALS — BP 126/74 | HR 83 | Ht 62.0 in | Wt 159.0 lb

## 2018-03-24 DIAGNOSIS — I10 Essential (primary) hypertension: Secondary | ICD-10-CM

## 2018-03-24 DIAGNOSIS — R55 Syncope and collapse: Secondary | ICD-10-CM | POA: Diagnosis not present

## 2018-03-24 NOTE — Progress Notes (Signed)
Cardiology Office Note  Date: 03/24/2018   ID: Amberlea, Spagnuolo 09/18/1946, MRN [id number]  PCP: Andres Shad, MD  Primary Cardiologist: Rozann Lesches, MD   Chief Complaint  Patient presents with  . History of syncope    History of Present Illness: [name] [name] is a 72 y.o. female last seen in March 2018.  She presents for a follow-up visit.  Over the last year she reports no episodes of frank syncope.  She has had a few spells where she felt dizzy but was able to sit down or lie down without subsequent event.  She does not report any exertional chest pain.  I personally reviewed her ECG today which shows sinus rhythm with diffuse nonspecific ST-T wave abnormalities consistent with repolarization changes.  I reviewed her medications which are outlined below.  She reports no changes in antihypertensive regimen.  Past Medical History:  Diagnosis Date  . GERD (gastroesophageal reflux disease)   . Gout   . Hyperlipidemia   . Hypertension   . PUD (peptic ulcer disease)   . Syncope    Neurocardiogenic    Past Surgical History:  Procedure Laterality Date  . ABDOMINAL HYSTERECTOMY    . COLONOSCOPY N/A 10/07/2014   Dr. Oneida Alar: redundant left colon, moderate sized external hemorrhoids   . LIPOMA RESECTION      Current Outpatient Medications  Medication Sig Dispense Refill  . AMITIZA 24 MCG capsule TAKE 1 CAPSULE TWICE DAILY WITH A MEAL 180 capsule 3  . Febuxostat (ULORIC PO) Take 1 tablet by mouth daily.    . Fluticasone-Salmeterol (ADVAIR) 500-50 MCG/DOSE AEPB Inhale 1 puff into the lungs every 12 (twelve) hours.    . furosemide (LASIX) 20 MG tablet Take 20 mg by mouth daily.      . hydrocortisone (PROCTOSOL HC) 2.5 % rectal cream Place rectally 2 (two) times daily. No more than 14 days at a time. (Patient taking differently: Place rectally 2 (two) times daily. No more than 14 days at a time. AS NEEDED) 28.35 g 1  . meclizine (ANTIVERT) 25 MG tablet Take  12.5-25 mg by mouth 3 (three) times daily as needed for dizziness.     . meloxicam (MOBIC) 7.5 MG tablet Take 7.5 mg by mouth as needed.     . metoprolol succinate (TOPROL-XL) 25 MG 24 hr tablet TAKE 1 TABLET EVERY DAY 90 tablet 3  . Naproxen Sodium 220 MG CAPS Take 220 mg by mouth daily as needed (pain).     Marland Kitchen omeprazole (PRILOSEC) 40 MG capsule Take 40 mg by mouth daily.      Marland Kitchen OVER THE COUNTER MEDICATION Fleets enema as needed    . potassium chloride (K-DUR) 10 MEQ tablet Take 20 mEq by mouth 2 (two) times daily.     . simvastatin (ZOCOR) 40 MG tablet Take 40 mg by mouth daily.      Marland Kitchen triamterene-hydrochlorothiazide (MAXZIDE) 75-50 MG per tablet Take 1 tablet by mouth daily.     No current facility-administered medications for this visit.    Allergies:  Acetaminophen-codeine; Allopurinol; and Tramadol   Social History: The patient  reports that she quit smoking about 20 years ago. Her smoking use included cigarettes. She started smoking about 54 years ago. She has a 20.00 pack-year smoking history. She has never used smokeless tobacco. She reports that she does not drink alcohol or use drugs.   ROS:  Please see the history of present illness. Otherwise, complete review of systems is  positive for none.  All other systems are reviewed and negative.   Physical Exam: VS:  BP 126/74   Pulse 83   Ht 5\' 2"  (1.575 m)   Wt 159 lb (72.1 kg)   SpO2 97%   BMI 29.08 kg/m , BMI Body mass index is 29.08 kg/m.  Wt Readings from Last 3 Encounters:  03/24/18 159 lb (72.1 kg)  01/29/18 154 lb 9.6 oz (70.1 kg)  07/17/17 153 lb 9.6 oz (69.7 kg)    General: Patient appears comfortable at rest. HEENT: Conjunctiva and lids normal, oropharynx clear. Neck: Supple, no elevated JVP or carotid bruits, no thyromegaly. Lungs: Clear to auscultation, nonlabored breathing at rest. Cardiac: Regular rate and rhythm, no S3, soft systolic murmur. Abdomen: Soft, nontender, bowel sounds present. Extremities: No  pitting edema, distal pulses 2+. Skin: Warm and dry. Musculoskeletal: No kyphosis. Neuropsychiatric: Alert and oriented x3, affect grossly appropriate.  ECG: I personally reviewed the tracing from 03/21/2017 which showed sinus rhythm with diffuse repolarization abnormalities.  Other Studies Reviewed Today:  Exercise echocardiogram 12/28/2013: Study Conclusions  - Stress: Functional capacity was excellent, >150% of predicted based on age and gender. - Stress ECG conclusions: The stress ECG was equivocal, baseline ST/T changes became more prominent with stress. This may be secondary to cavity obliteration or hypertensive response in the setting of underlying LVH. - Baseline: LV global systolic function was normal. There is baseline LVH and mitral anular calcification. - Peak stress: LV global systolic function was normal. There was hyperdynamic response of all wall segments with cavity obliteration. Impressions:  - Normal study after maximal exercise.  Assessment and Plan:  1.  Vasovagal syncope.  Patient has had no frank recurrences and we continue with observation.  I reviewed her ECG which shows chronic ST-T wave abnormalities due to repolarization.  2.  Essential hypertension, follows with PCP.  No changes made in medical regimen.  Blood pressure is well controlled today.  Current medicines were reviewed with the patient today.   Orders Placed This Encounter  Procedures  . EKG 12-Lead    Disposition: Follow-up in 1 year.  Signed, Satira Sark, MD, [facility] 03/24/2018 1:46 PM    [facility] at [facility] [address] [place], [place] [address] Phone: [phone]; Fax: [phone]

## 2018-03-24 NOTE — Patient Instructions (Signed)
Your physician wants you to follow-up in: 1 year with Dr.McDowell You will receive a reminder letter in the mail two months in advance. If you don't receive a letter, please call our office to schedule the follow-up appointment.    Your physician recommends that you continue on your current medications as directed. Please refer to the Current Medication list given to you today.    If you need a refill on your cardiac medications before your next appointment, please call your pharmacy.     No lab work or tests ordered today.      Thank you for choosing  [facility] !        

## 2018-06-02 ENCOUNTER — Other Ambulatory Visit: Payer: Self-pay | Admitting: Gastroenterology

## 2018-07-29 ENCOUNTER — Ambulatory Visit: Payer: Medicare PPO | Admitting: Nurse Practitioner

## 2018-07-29 ENCOUNTER — Encounter: Payer: Self-pay | Admitting: Nurse Practitioner

## 2018-08-07 ENCOUNTER — Telehealth: Payer: Self-pay | Admitting: Gastroenterology

## 2018-08-07 DIAGNOSIS — K5900 Constipation, unspecified: Secondary | ICD-10-CM

## 2018-08-07 NOTE — Telephone Encounter (Signed)
Pt called to say that she uses [facility] mail order pharmacy now and needs her Amitiza refilled. [phone]

## 2018-08-07 NOTE — Telephone Encounter (Signed)
Tried to call pt to get phone/fax numbers to put in for pharmacy.  Could not leave a message.

## 2018-08-07 NOTE — Telephone Encounter (Signed)
Called pt. She has [facility] now, but Rx is with [facility]. That pharmacy has been entered.  Forwarding to refill box.

## 2018-08-08 MED ORDER — LUBIPROSTONE 24 MCG PO CAPS: ORAL_CAPSULE | ORAL | 3 refills | Status: DC

## 2018-08-08 NOTE — Telephone Encounter (Signed)
Rx sent per patient request  

## 2018-08-11 NOTE — Telephone Encounter (Signed)
PT is aware.

## 2018-12-16 ENCOUNTER — Encounter: Payer: Self-pay | Admitting: Gastroenterology

## 2018-12-16 ENCOUNTER — Ambulatory Visit (INDEPENDENT_AMBULATORY_CARE_PROVIDER_SITE_OTHER): Payer: Medicare HMO | Admitting: Gastroenterology

## 2018-12-16 VITALS — BP 150/80 | HR 72 | Temp 97.1°F | Ht 61.5 in | Wt 164.2 lb

## 2018-12-16 DIAGNOSIS — K219 Gastro-esophageal reflux disease without esophagitis: Secondary | ICD-10-CM

## 2018-12-16 DIAGNOSIS — K649 Unspecified hemorrhoids: Secondary | ICD-10-CM | POA: Insufficient documentation

## 2018-12-16 DIAGNOSIS — K5900 Constipation, unspecified: Secondary | ICD-10-CM

## 2018-12-16 MED ORDER — HYDROCORTISONE 2.5 % RE CREA: TOPICAL_CREAM | RECTAL | 3 refills | Status: DC

## 2018-12-16 NOTE — Patient Instructions (Signed)
Continue Amitiza, omeprazole as before.   RX for hemorrhoid cream sent to your pharmacy. Use as needed.   Return to the office in one year or call sooner if needed.

## 2018-12-16 NOTE — Progress Notes (Signed)
Primary Care Physician: Andres Shad, MD  Primary Gastroenterologist:  Barney Drain, MD   Chief Complaint  Patient presents with  . Constipation    managed currently  . Gastroesophageal Reflux    no issues    HPI: [name] [name] is a 72 y.o. female here for follow-up.  She has constipation, GERD, hemorrhoids.  Last seen in February 2019.  Colonoscopy October 2015 with redundant left colon, moderate sized external hemorrhoids.  Doing very well on Amitiza 1mcg BID. Stools are regular. No melena, brbpr. No abd pain. Reflux is well controlled on omeprazole. She uses limited mobic, sometimes uses aleve for pain management. Advised to continue PPI while on these medications to help protect stomach. Possible remote PUD history. Patient doesn't recall details and nothing available in Epic.   Current Outpatient Medications  Medication Sig Dispense Refill  . Febuxostat (ULORIC PO) Take 1 tablet by mouth daily.    . Fluticasone-Salmeterol (ADVAIR) 500-50 MCG/DOSE AEPB Inhale 1 puff into the lungs every 12 (twelve) hours.    . furosemide (LASIX) 20 MG tablet Take 20 mg by mouth daily.      Marland Kitchen lubiprostone (AMITIZA) 24 MCG capsule TAKE 1 CAPSULE TWICE DAILY WITH A MEAL 180 capsule 3  . meclizine (ANTIVERT) 25 MG tablet Take 12.5-25 mg by mouth 3 (three) times daily as needed for dizziness.     . meloxicam (MOBIC) 7.5 MG tablet Take 7.5 mg by mouth as needed.     . metoprolol succinate (TOPROL-XL) 25 MG 24 hr tablet TAKE 1 TABLET EVERY DAY 90 tablet 3  . Naproxen Sodium 220 MG CAPS Take 220 mg by mouth daily as needed (pain).     Marland Kitchen omeprazole (PRILOSEC) 40 MG capsule Take 40 mg by mouth daily.      Marland Kitchen OVER THE COUNTER MEDICATION Fleets enema as needed    . potassium chloride (K-DUR) 10 MEQ tablet Take 20 mEq by mouth 2 (two) times daily.     Marland Kitchen PROCTOSOL HC 2.5 % rectal cream PLACE RECTALLY 2 (TWO) TIMES DAILY. NO MORE THAN 14 DAYS AT A TIME. 28.35 g 1  . simvastatin (ZOCOR) 40 MG  tablet Take 40 mg by mouth daily.      Marland Kitchen triamterene-hydrochlorothiazide (MAXZIDE) 75-50 MG per tablet Take 1 tablet by mouth daily.     No current facility-administered medications for this visit.     Allergies as of 12/16/2018 - Review Complete 12/16/2018  Allergen Reaction Noted  . Acetaminophen-codeine    . Allopurinol    . Tramadol      ROS:  General: Negative for anorexia, weight loss, fever, chills, fatigue, weakness. ENT: Negative for hoarseness, difficulty swallowing , nasal congestion. CV: Negative for chest pain, angina, palpitations, dyspnea on exertion, peripheral edema.  Respiratory: Negative for dyspnea at rest, dyspnea on exertion, cough, sputum, wheezing.  GI: See history of present illness. GU:  Negative for dysuria, hematuria, urinary incontinence, urinary frequency, nocturnal urination.  Endo: Negative for unusual weight change.    Physical Examination:   BP (!) 150/80   Pulse 72   Temp (!) 97.1 F (36.2 C) (Oral)   Ht 5' 1.5" (1.562 m)   Wt 164 lb 3.2 oz (74.5 kg)   BMI 30.52 kg/m   General: Well-nourished, well-developed in no acute distress.  Eyes: No icterus. Mouth: Oropharyngeal mucosa moist and pink , no lesions erythema or exudate. Lungs: Clear to auscultation bilaterally.  Heart: Regular rate and rhythm, no murmurs rubs  or gallops.  Abdomen: Bowel sounds are normal, nontender, nondistended, no hepatosplenomegaly or masses, no abdominal bruits or hernia , no rebound or guarding.   Extremities: No lower extremity edema. No clubbing or deformities. Neuro: Alert and oriented x 4   Skin: Warm and dry, no jaundice.   Psych: Alert and cooperative, normal mood and affect.

## 2018-12-16 NOTE — Progress Notes (Signed)
CC'D TO PCP °

## 2018-12-16 NOTE — Assessment & Plan Note (Signed)
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 Uses hemorrhoid creams off and on with good relief.  Advised not to use chronically, no more than 14 days at a time.  Must have periods of time off of medication.

## 2018-12-16 NOTE — Assessment & Plan Note (Signed)
Doing very well on amities a 24 mcg twice daily.  Continue current regimen.  I have noted previously concern for weight loss in the setting of dental issues.  She is fully recovered.  Her appetite is improved.  She has gained weight and is doing very well.  She is happy with her current weight and wants to stay steady at 165 pounds.  Return to the office in on year or call sooner if needed

## 2019-04-06 ENCOUNTER — Ambulatory Visit: Payer: Medicare HMO | Admitting: Cardiology

## 2019-05-06 ENCOUNTER — Other Ambulatory Visit: Payer: Self-pay

## 2019-05-06 ENCOUNTER — Emergency Department (HOSPITAL_COMMUNITY): Payer: Medicare HMO

## 2019-05-06 ENCOUNTER — Emergency Department (HOSPITAL_COMMUNITY)
Admission: EM | Admit: 2019-05-06 | Discharge: 2019-05-06 | Disposition: A | Discharge status: Home / Self Care | Payer: Medicare HMO | Attending: Emergency Medicine | Admitting: Emergency Medicine

## 2019-05-06 ENCOUNTER — Encounter (HOSPITAL_COMMUNITY): Payer: Self-pay | Admitting: *Deleted

## 2019-05-06 DIAGNOSIS — E876 Hypokalemia: Secondary | ICD-10-CM

## 2019-05-06 DIAGNOSIS — Z87891 Personal history of nicotine dependence: Secondary | ICD-10-CM | POA: Diagnosis not present

## 2019-05-06 DIAGNOSIS — Z79899 Other long term (current) drug therapy: Secondary | ICD-10-CM | POA: Diagnosis not present

## 2019-05-06 DIAGNOSIS — R079 Chest pain, unspecified: Secondary | ICD-10-CM | POA: Diagnosis present

## 2019-05-06 DIAGNOSIS — I10 Essential (primary) hypertension: Secondary | ICD-10-CM | POA: Insufficient documentation

## 2019-05-06 DIAGNOSIS — R072 Precordial pain: Secondary | ICD-10-CM

## 2019-05-06 HISTORY — DX: Hypokalemia: E87.6

## 2019-05-06 LAB — CBC
HCT: 48 % — ABNORMAL HIGH (ref 36.0–46.0)
Hemoglobin: 15.5 g/dL — ABNORMAL HIGH (ref 12.0–15.0)
MCH: 28.3 pg (ref 26.0–34.0)
MCHC: 32.3 g/dL (ref 30.0–36.0)
MCV: 87.6 fL (ref 80.0–100.0)
Platelets: 294 10*3/uL (ref 150–400)
RBC: 5.48 MIL/uL — ABNORMAL HIGH (ref 3.87–5.11)
RDW: 13.1 % (ref 11.5–15.5)
WBC: 6.1 10*3/uL (ref 4.0–10.5)
nRBC: 0 % (ref 0.0–0.2)

## 2019-05-06 LAB — BASIC METABOLIC PANEL
Anion gap: 13 (ref 5–15)
BUN: 20 mg/dL (ref 8–23)
CO2: 33 mmol/L — ABNORMAL HIGH (ref 22–32)
Calcium: 9.9 mg/dL (ref 8.9–10.3)
Chloride: 89 mmol/L — ABNORMAL LOW (ref 98–111)
Creatinine, Ser: 1.14 mg/dL — ABNORMAL HIGH (ref 0.44–1.00)
GFR calc Af Amer: 55 mL/min — ABNORMAL LOW (ref >60)
GFR calc non Af Amer: 48 mL/min — ABNORMAL LOW (ref >60)
Glucose, Bld: 111 mg/dL — ABNORMAL HIGH (ref 70–99)
Potassium: 2.7 mmol/L — CL (ref 3.5–5.1)
Sodium: 135 mmol/L (ref 135–145)

## 2019-05-06 LAB — BRAIN NATRIURETIC PEPTIDE: B Natriuretic Peptide: 109 pg/mL — ABNORMAL HIGH (ref 0.0–100.0)

## 2019-05-06 LAB — TROPONIN I: Troponin I: <0.03 ng/mL (ref <0.03)

## 2019-05-06 MED ORDER — POTASSIUM CHLORIDE CRYS ER 20 MEQ PO TBCR
40.0000 meq | EXTENDED_RELEASE_TABLET | Freq: Once | ORAL | Status: AC
  Administered 2019-05-06: 40 meq via ORAL
  Filled 2019-05-06: qty 2

## 2019-05-06 NOTE — ED Triage Notes (Signed)
Pt c/o intermittent mid chest pain described as pulling, headache, dizziness that started on Sunday. Pt went to Urgent Care in [place] today was told that she had changes in her EKG and was told to go to the ED for a complete workup. Pt reports slight SOB with exertion. Denies n/v.

## 2019-05-06 NOTE — ED Notes (Signed)
Date and time results received: 05/06/19 3:46 PM   Test: potassium  Critical Value: 2.7  Name of Provider Notified: Zackowski   Orders Received? Or Actions Taken?: none at this time.

## 2019-05-06 NOTE — ED Provider Notes (Addendum)
[facility] EMERGENCY DEPARTMENT Provider Note   CSN: [id number] Arrival date & time: 05/06/19  1326    History   Chief Complaint Chief Complaint  Patient presents with  . Chest Pain    HPI FAVOR [name] [name] is a 73 y.o. female.     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  Pain only lasts seconds never lasted more than a minute.  Certainly is never lasted 15 minutes or longer.  Patient without any fevers no upper respiratory type symptoms.  Patient has no known coronary artery disease.  Is followed by cardiology secondary to syncope that has been longstanding.  No diabetes.  Patient's had trouble with hypokalemia in the past.  Patient states she is still on potassium supplementation.  Past medical history significant for hyperlipidemia hypertension.  Patient has an appointment with cardiology coming up in June.  Nursing note made some mention of headache and dizziness.  Patient denied any of that to me.  His symptoms all started on Sunday.  They have occurred each day but only when she is pulling that she get the pain.  Pain is in the substernal area.     Past Medical History:  Diagnosis Date  . GERD (gastroesophageal reflux disease)   . Gout   . Hyperlipidemia   . Hypertension   . Hypokalemia   . PUD (peptic ulcer disease)   . Syncope    Neurocardiogenic    Patient Active Problem List   Diagnosis Date Noted  . Hemorrhoid 12/16/2018  . GERD (gastroesophageal reflux disease) 01/29/2018  . Constipation 07/17/2017  . Rectal bleeding 01/30/2017  . Chest pain 12/09/2013  . Renal insufficiency 05/01/2012  . Essential hypertension, benign 05/03/2010  . VASOVAGAL SYNCOPE 05/03/2010    Past Surgical History:  Procedure Laterality Date  . ABDOMINAL HYSTERECTOMY    . CATARACT EXTRACTION Bilateral   . COLONOSCOPY N/A  10/07/2014   Dr. Oneida Alar: redundant left colon, moderate sized external hemorrhoids   . LIPOMA RESECTION       OB History   No obstetric history on file.      Home Medications    Prior to Admission medications   Medication Sig Start Date End Date Taking? Authorizing Provider  Febuxostat (ULORIC PO) Take 1 tablet by mouth daily.    [provider]  Fluticasone-Salmeterol (ADVAIR) 500-50 MCG/DOSE AEPB Inhale 1 puff into the lungs every 12 (twelve) hours.    [provider]  furosemide (LASIX) 20 MG tablet Take 20 mg by mouth daily.      [provider]  hydrocortisone (PROCTOSOL HC) 2.5 % rectal cream PLACE RECTALLY 2 (TWO) TIMES DAILY. NO MORE THAN 14 DAYS AT A TIME. 12/16/18   Mahala Menghini, PA-C  lubiprostone (AMITIZA) 24 MCG capsule TAKE 1 CAPSULE TWICE DAILY WITH A MEAL 08/08/18   Carlis Stable, NP  meclizine (ANTIVERT) 25 MG tablet Take 12.5-25 mg by mouth 3 (three) times daily as needed for dizziness.     [provider]  meloxicam (MOBIC) 7.5 MG tablet Take 7.5 mg by mouth as needed.  09/14/15   [provider]  metoprolol succinate (TOPROL-XL) 25 MG 24 hr tablet TAKE 1 TABLET EVERY DAY 01/08/17   Satira Sark, MD  Naproxen Sodium 220 MG CAPS Take 220 mg by mouth daily as needed (  pain).  01/18/14   [provider]  omeprazole (PRILOSEC) 40 MG capsule Take 40 mg by mouth daily.      [provider]  OVER THE COUNTER MEDICATION Fleets enema as needed    [provider]  potassium chloride (K-DUR) 10 MEQ tablet Take 20 mEq by mouth 2 (two) times daily.  11/17/13   [provider]  simvastatin (ZOCOR) 40 MG tablet Take 40 mg by mouth daily.      [provider]  triamterene-hydrochlorothiazide (MAXZIDE) 75-50 MG per tablet Take 1 tablet by mouth daily.    [provider]    Family History Family History  Problem Relation Age of Onset  . Stroke Mother   . Dementia Father   .  Cancer - Other Sister     Social History Social History   Tobacco Use  . Smoking status: Former Smoker    Packs/day: 1.00    Years: 20.00    Pack years: 20.00    Types: Cigarettes    Start date: 03/16/1964    Last attempt to quit: 10/07/1997    Years since quitting: 21.5  . Smokeless tobacco: Never Used  Substance Use Topics  . Alcohol use: No    Alcohol/week: 0.0 standard drinks  . Drug use: No     Allergies   Acetaminophen-codeine; Allopurinol; and Tramadol   Review of Systems Review of Systems  Constitutional: Negative for chills and fever.  HENT: Negative for rhinorrhea and sore throat.   Eyes: Negative for visual disturbance.  Respiratory: Negative for cough and shortness of breath.   Cardiovascular: Positive for chest pain. Negative for leg swelling.  Gastrointestinal: Negative for abdominal pain, diarrhea, nausea and vomiting.  Genitourinary: Negative for dysuria.  Musculoskeletal: Negative for back pain and neck pain.  Skin: Negative for rash.  Neurological: Negative for dizziness, light-headedness and headaches.  Hematological: Does not bruise/bleed easily.  Psychiatric/Behavioral: Negative for confusion.     Physical Exam Updated Vital Signs BP 133/70   Pulse (!) 58   Temp 98.9 F (37.2 C) (Oral)   Resp 18   Ht 1.575 m (5\' 2" )   Wt 73 kg   SpO2 98%   BMI 29.45 kg/m   Physical Exam Vitals signs and nursing note reviewed.  Constitutional:      General: She is not in acute distress.    Appearance: Normal appearance. She is well-developed.  HENT:     Head: Normocephalic and atraumatic.  Eyes:     Extraocular Movements: Extraocular movements intact.     Conjunctiva/sclera: Conjunctivae normal.     Pupils: Pupils are equal, round, and reactive to light.  Neck:     Musculoskeletal: Neck supple.  Cardiovascular:     Rate and Rhythm: Normal rate and regular rhythm.     Heart sounds: No murmur.  Pulmonary:     Effort: Pulmonary effort is  normal. No respiratory distress.     Breath sounds: Normal breath sounds.  Chest:     Chest wall: No tenderness.  Abdominal:     Palpations: Abdomen is soft.     Tenderness: There is no abdominal tenderness.  Musculoskeletal:        General: No swelling.  Skin:    General: Skin is warm and dry.  Neurological:     General: No focal deficit present.     Mental Status: She is alert and oriented to person, place, and time.     Cranial Nerves: No cranial nerve deficit.  Sensory: No sensory deficit.     Motor: No weakness.      ED Treatments / Results  Labs (all labs ordered are listed, but only abnormal results are displayed) Labs Reviewed  BASIC METABOLIC PANEL - Abnormal; Notable for the following components:      Result Value   Potassium 2.7 (*)    Chloride 89 (*)    CO2 33 (*)    Glucose, Bld 111 (*)    Creatinine, Ser 1.14 (*)    GFR calc non Af Amer 48 (*)    GFR calc Af Amer 55 (*)    All other components within normal limits  CBC - Abnormal; Notable for the following components:   RBC 5.48 (*)    Hemoglobin 15.5 (*)    HCT 48.0 (*)    All other components within normal limits  BRAIN NATRIURETIC PEPTIDE - Abnormal; Notable for the following components:   B Natriuretic Peptide 109.0 (*)    All other components within normal limits  TROPONIN I    EKG EKG Interpretation  Date/Time:  Wednesday May 06 2019 14:22:37 EDT Ventricular Rate:  66 PR Interval:    QRS Duration: 117 QT Interval:  420 QTC Calculation: 440 R Axis:   8 Text Interpretation:  Sinus rhythm LVH with secondary repolarization abnormality ST depression, consider subendocardial injury Confirmed by Fredia Sorrow [phone]) on 05/06/2019 2:29:58 PM   Radiology Dg Chest Port 1 View  Result Date: 05/06/2019 CLINICAL DATA:  Chest pain, headache and dizziness since 05/03/2019. EXAM: PORTABLE CHEST 1 VIEW COMPARISON:  None. FINDINGS: Lungs clear. Heart size normal. Aortic atherosclerosis noted. No  pneumothorax or pleural fluid. No acute or focal bony abnormality. IMPRESSION: No acute disease. Atherosclerosis. Electronically Signed   By: Inge Rise M.D.   On: 05/06/2019 16:01    Procedures Procedures (including critical care time)  Medications Ordered in ED Medications  potassium chloride SA (K-DUR) CR tablet 40 mEq (has no administration in time range)  potassium chloride SA (K-DUR) CR tablet 40 mEq (has no administration in time range)     Initial Impression / Assessment and Plan / ED Course  I have reviewed the triage vital signs and the nursing notes.  Pertinent labs & imaging results that were available during my care of the patient were reviewed by me and considered in my medical decision making (see chart for details).       Work-up was significant for hypokalemia.  Potassium was 2.7.  Patient states she still taking potassium supplementation at home.  Patient is on Lasix.  Patient given 40 mEq of potassium orally here x2.  Will have her follow-up with her regular doctor in a week to have this rechecked.  In addition we will have patient start taking a baby aspirin a day and follow-up with cardiology.  She will return for any chest pain lasting 15 minutes or longer.  Chest x-ray without any acute findings.  Troponin was negative.  EKG without any acute findings other than some ST segment depression that was subtle.  Possible subendocardial injury.  But with the troponin being negative it is reassuring.   Final Clinical Impressions(s) / ED Diagnoses   Final diagnoses:  Hypokalemia  Precordial pain    ED Discharge Orders    None       Fredia Sorrow, MD 05/06/19 1649    Fredia Sorrow, MD 05/06/19 1650    Fredia Sorrow, MD 05/06/19 1650

## 2019-05-06 NOTE — Discharge Instructions (Signed)
For the chest pain.  Make an appointment to follow-up with your cardiologist.  Start taking a baby aspirin a day.  Return in get seen for any chest pain lasting 15 minutes or longer.  For the low potassium.  Continue your potassium supplement at home.  Make an appointment to follow-up with your primary care doctor for recheck of your potassium in 1 week.

## 2019-05-06 NOTE — ED Notes (Signed)
Spoke with [name], sister, regarding pt status.

## 2019-05-29 ENCOUNTER — Telehealth: Payer: Self-pay | Admitting: Cardiology

## 2019-05-29 NOTE — Telephone Encounter (Signed)
Virtual Visit Pre-Appointment Phone Call  "(Name), I am calling you today to discuss your upcoming appointment. We are currently trying to limit exposure to the virus that causes COVID-19 by seeing patients at home rather than in the office."  1. "What is the BEST phone number to call the day of the visit?" - include this in appointment notes  2. Do you have or have access to (through a family member/friend) a smartphone with video capability that we can use for your visit?" a. If yes - list this number in appt notes as cell (if different from BEST phone #) and list the appointment type as a VIDEO visit in appointment notes b. If no - list the appointment type as a PHONE visit in appointment notes  3. Confirm consent - "In the setting of the current Covid19 crisis, you are scheduled for a (phone or video) visit with your provider on (date) at (time).  Just as we do with many in-office visits, in order for you to participate in this visit, we must obtain consent.  If you'd like, I can send this to your mychart (if signed up) or email for you to review.  Otherwise, I can obtain your verbal consent now.  All virtual visits are billed to your insurance company just like a normal visit would be.  By agreeing to a virtual visit, we'd like you to understand that the technology does not allow for your provider to perform an examination, and thus may limit your provider's ability to fully assess your condition. If your provider identifies any concerns that need to be evaluated in person, we will make arrangements to do so.  Finally, though the technology is pretty good, we cannot assure that it will always work on either your or our end, and in the setting of a video visit, we may have to convert it to a phone-only visit.  In either situation, we cannot ensure that we have a secure connection.  Are you willing to proceed?" STAFF: Did the patient verbally acknowledge consent to telehealth visit? Document  YES/NO here: yes  4. Advise patient to be prepared - "Two hours prior to your appointment, go ahead and check your blood pressure, pulse, oxygen saturation, and your weight (if you have the equipment to check those) and write them all down. When your visit starts, your provider will ask you for this information. If you have an Apple Watch or Kardia device, please plan to have heart rate information ready on the day of your appointment. Please have a pen and paper handy nearby the day of the visit as well."  5. Give patient instructions for MyChart download to smartphone OR [email or web address] as below if video visit (depending on what platform provider is using)  6. Inform patient they will receive a phone call 15 minutes prior to their appointment time (may be from unknown caller ID) so they should be prepared to answer    TELEPHONE CALL NOTE  [name] [name] has been deemed a candidate for a follow-up tele-health visit to limit community exposure during the Covid-19 pandemic. I spoke with the patient via phone to ensure availability of phone/video source, confirm preferred email & phone number, and discuss instructions and expectations.  I reminded [name] [name] to be prepared with any vital sign and/or heart rhythm information that could potentially be obtained via home monitoring, at the time of her visit. I reminded [name] [name] to expect a phone call prior to  her visit.  [name] [name] 05/29/2019 12:52 PM   INSTRUCTIONS FOR DOWNLOADING THE MYCHART APP TO SMARTPHONE  - The patient must first make sure to have activated MyChart and know their login information - If Apple, go to [employer] and type in MyChart in the search bar and download the app. If Android, ask patient to go to [employer] and type in [place] in the search bar and download the app. The app is free but as with any other app downloads, their phone may require them to verify saved payment information or Apple/Android  password.  - The patient will need to then log into the app with their MyChart username and password, and select [facility] as their healthcare provider to link the account. When it is time for your visit, go to the MyChart app, find appointments, and click Begin Video Visit. Be sure to Select Allow for your device to access the Microphone and Camera for your visit. You will then be connected, and your provider will be with you shortly.  **If they have any issues connecting, or need assistance please contact MyChart service desk [phone])**  **If using a computer, in order to ensure the best quality for their visit they will need to use either of the following Internet Browsers: [employer], or Google Chrome**  IF USING DOXIMITY or DOXY.ME - The patient will receive a link just prior to their visit by text.     FULL LENGTH CONSENT FOR TELE-HEALTH VISIT   I hereby voluntarily request, consent and authorize [facility] and its employed or contracted physicians, physician assistants, nurse practitioners or other licensed health care professionals (the Practitioner), to provide me with telemedicine health care services (the Services") as deemed necessary by the treating Practitioner. I acknowledge and consent to receive the Services by the Practitioner via telemedicine. I understand that the telemedicine visit will involve communicating with the Practitioner through live audiovisual communication technology and the disclosure of certain medical information by electronic transmission. I acknowledge that I have been given the opportunity to request an in-person assessment or other available alternative prior to the telemedicine visit and am voluntarily participating in the telemedicine visit.  I understand that I have the right to withhold or withdraw my consent to the use of telemedicine in the course of my care at any time, without affecting my right to future care or treatment,  and that the Practitioner or I may terminate the telemedicine visit at any time. I understand that I have the right to inspect all information obtained and/or recorded in the course of the telemedicine visit and may receive copies of available information for a reasonable fee.  I understand that some of the potential risks of receiving the Services via telemedicine include:   Delay or interruption in medical evaluation due to technological equipment failure or disruption;  Information transmitted may not be sufficient (e.g. poor resolution of images) to allow for appropriate medical decision making by the Practitioner; and/or   In rare instances, security protocols could fail, causing a breach of personal health information.  Furthermore, I acknowledge that it is my responsibility to provide information about my medical history, conditions and care that is complete and accurate to the best of my ability. I acknowledge that Practitioner's advice, recommendations, and/or decision may be based on factors not within their control, such as incomplete or inaccurate data provided by me or distortions of diagnostic images or specimens that may result from electronic transmissions. I  understand that the practice of medicine is not an exact science and that Practitioner makes no warranties or guarantees regarding treatment outcomes. I acknowledge that I will receive a copy of this consent concurrently upon execution via email to the email address I last provided but may also request a printed copy by calling the office of [facility].    I understand that my insurance will be billed for this visit.   I have read or had this consent read to me.  I understand the contents of this consent, which adequately explains the benefits and risks of the Services being provided via telemedicine.   I have been provided ample opportunity to ask questions regarding this consent and the Services and have had my questions  answered to my satisfaction.  I give my informed consent for the services to be provided through the use of telemedicine in my medical care  By participating in this telemedicine visit I agree to the above.

## 2019-05-31 NOTE — Progress Notes (Signed)
Virtual Visit via Telephone Note   This visit type was conducted due to national recommendations for restrictions regarding the COVID-19 Pandemic (e.g. social distancing) in an effort to limit this patient's exposure and mitigate transmission in our community.  Due to her co-morbid illnesses, this patient is at least at moderate risk for complications without adequate follow up.  This format is felt to be most appropriate for this patient at this time.  The patient did not have access to video technology/had technical difficulties with video requiring transitioning to audio format only (telephone).  All issues noted in this document were discussed and addressed.  No physical exam could be performed with this format.  Please refer to the patient's chart for her  consent to telehealth for [facility].   Date:  06/01/2019   ID:  [name] [name], DOB 1946-05-27, MRN [id number]  Patient Location: Home Provider Location: Office  PCP:  Andres Shad, MD  Cardiologist:  Rozann Lesches, MD Electrophysiologist:  None   Evaluation Performed:  New Patient Evaluation  Chief Complaint:   Cardiac follow-up  History of Present Illness:    [name] [name] is a 73 y.o. female last seen in April 2019.  She did not have video access and we spoke by phone today.  She does not report any interval episodes of syncope.  I reviewed her records, she was seen in the ER back in May with somewhat atypical chest discomfort and ECG changes although in the setting of severe hypokalemia.  He was placed on a higher potassium supplement and has had no recurrence, following with a nephrologist.  I reviewed her current medications which are listed below.  She reports compliance and no obvious intolerances.  She has been helping to care for a friend with Alzheimer's dementia, this is been quite stressful.  The patient does not have symptoms concerning for COVID-19 infection (fever, chills, cough, or new shortness  of breath).  She states that she wears a mask when she goes out.   Past Medical History:  Diagnosis Date  . GERD (gastroesophageal reflux disease)   . Gout   . Hyperlipidemia   . Hypertension   . Hypokalemia   . PUD (peptic ulcer disease)   . Syncope    Neurocardiogenic   Past Surgical History:  Procedure Laterality Date  . ABDOMINAL HYSTERECTOMY    . CATARACT EXTRACTION Bilateral   . COLONOSCOPY N/A 10/07/2014   Dr. Oneida Alar: redundant left colon, moderate sized external hemorrhoids   . LIPOMA RESECTION       Current Meds  Medication Sig  . Febuxostat (ULORIC PO) Take 1 tablet by mouth daily.  . Fluticasone-Salmeterol (ADVAIR) 500-50 MCG/DOSE AEPB Inhale 1 puff into the lungs every 12 (twelve) hours.  . furosemide (LASIX) 20 MG tablet Take 20 mg by mouth daily.    . hydrocortisone (PROCTOSOL HC) 2.5 % rectal cream PLACE RECTALLY 2 (TWO) TIMES DAILY. NO MORE THAN 14 DAYS AT A TIME.  Marland Kitchen lubiprostone (AMITIZA) 24 MCG capsule TAKE 1 CAPSULE TWICE DAILY WITH A MEAL  . meclizine (ANTIVERT) 25 MG tablet Take 12.5-25 mg by mouth 3 (three) times daily as needed for dizziness.   . meloxicam (MOBIC) 7.5 MG tablet Take 7.5 mg by mouth as needed.   . metoprolol succinate (TOPROL-XL) 25 MG 24 hr tablet TAKE 1 TABLET EVERY DAY  . Naproxen Sodium 220 MG CAPS Take 220 mg by mouth daily as needed (pain).   Marland Kitchen omeprazole (PRILOSEC) 40 MG capsule  Take 40 mg by mouth daily.    Marland Kitchen OVER THE COUNTER MEDICATION Fleets enema as needed  . potassium chloride (K-DUR) 10 MEQ tablet Take 20 mEq by mouth 3 (three) times daily.   . simvastatin (ZOCOR) 40 MG tablet Take 40 mg by mouth daily.    Marland Kitchen triamterene-hydrochlorothiazide (MAXZIDE) 75-50 MG per tablet Take 1 tablet by mouth daily.     Allergies:   Acetaminophen-codeine; Allopurinol; and Tramadol   Social History   Tobacco Use  . Smoking status: Former Smoker    Packs/day: 1.00    Years: 20.00    Pack years: 20.00    Types: Cigarettes    Start  date: 03/16/1964    Last attempt to quit: 10/07/1997    Years since quitting: 21.6  . Smokeless tobacco: Never Used  Substance Use Topics  . Alcohol use: No    Alcohol/week: 0.0 standard drinks  . Drug use: No     Family Hx: The patient's family history includes Cancer - Other in her sister; Dementia in her father; Stroke in her mother.  ROS:   Please see the history of present illness. All other systems reviewed and are negative.   Prior CV studies:   The following studies were reviewed today:  Exercise echocardiogram 12/28/2013: Study Conclusions  - Stress: Functional capacity was excellent, >150% of predicted based on age and gender. - Stress ECG conclusions: The stress ECG was equivocal, baseline ST/T changes became more prominent with stress. This may be secondary to cavity obliteration or hypertensive response in the setting of underlying LVH. - Baseline: LV global systolic function was normal. There is baseline LVH and mitral anular calcification. - Peak stress: LV global systolic function was normal. There was hyperdynamic response of all wall segments with cavity obliteration. Impressions:  - Normal study after maximal exercise.  Labs/Other Tests and Data Reviewed:    EKG:  An ECG dated 05/06/2019 was personally reviewed today and demonstrated:  Sinus rhythm with PAC, LVH and repolarization changes.  Recent Labs: 05/06/2019: B Natriuretic Peptide 109.0; BUN 20; Creatinine, Ser 1.14; Hemoglobin 15.5; Platelets 294; Potassium 2.7; Sodium 135    Wt Readings from Last 3 Encounters:  06/01/19 165 lb (74.8 kg)  05/06/19 161 lb (73 kg)  12/16/18 164 lb 3.2 oz (74.5 kg)     Objective:    Vital Signs:  BP (!) 141/80   Pulse (!) 56   Ht 5' 1.5" (1.562 m)   Wt 165 lb (74.8 kg)   BMI 30.67 kg/m    Patient spoke in full sentences, not short of breath. No audible wheezing or coughing. Normal speech pattern.  ASSESSMENT & PLAN:    1.  History of  vasovagal syncope, no significant recurrences.  Continue observation.  I did review her recent ECG from May.  She remains on Toprol-XL.  2.  Hypokalemia, potassium was 2.7 back in May.  She is now on higher potassium supplementation and followed by nephrology.  She states that she has follow-up lab work pending.  3.  Intermittent leg swelling, she uses Lasix for this, has cut it back to every other day.  4.  Essential hypertension, no changes made to present regimen, keep follow-up with PCP.  COVID-19 Education: The signs and symptoms of COVID-19 were discussed with the patient and how to seek care for testing (follow up with PCP or arrange E-visit).  The importance of social distancing was discussed today.  Time:   Today, I have spent 6 minutes with  the patient with telehealth technology discussing the above problems.     Medication Adjustments/Labs and Tests Ordered: Current medicines are reviewed at length with the patient today.  Concerns regarding medicines are outlined above.   Tests Ordered: No orders of the defined types were placed in this encounter.   Medication Changes: No orders of the defined types were placed in this encounter.   Disposition:  Follow up 1 year in the [place] office.  Signed, Rozann Lesches, MD  06/01/2019 1:20 PM    [facility]

## 2019-06-01 ENCOUNTER — Telehealth (INDEPENDENT_AMBULATORY_CARE_PROVIDER_SITE_OTHER): Payer: Medicare HMO | Admitting: Cardiology

## 2019-06-01 ENCOUNTER — Encounter: Payer: Self-pay | Admitting: Cardiology

## 2019-06-01 VITALS — BP 141/80 | HR 56 | Ht 61.5 in | Wt 165.0 lb

## 2019-06-01 DIAGNOSIS — R55 Syncope and collapse: Secondary | ICD-10-CM

## 2019-06-01 DIAGNOSIS — Z7189 Other specified counseling: Secondary | ICD-10-CM

## 2019-06-01 DIAGNOSIS — Z87898 Personal history of other specified conditions: Secondary | ICD-10-CM

## 2019-06-01 DIAGNOSIS — I10 Essential (primary) hypertension: Secondary | ICD-10-CM

## 2019-06-01 DIAGNOSIS — M7989 Other specified soft tissue disorders: Secondary | ICD-10-CM

## 2019-06-01 DIAGNOSIS — E876 Hypokalemia: Secondary | ICD-10-CM

## 2019-06-01 NOTE — Progress Notes (Signed)
Medication Instructions:  Your physician recommends that you continue on your current medications as directed. Please refer to the Current Medication list given to you today.   Labwork: none Testing/Procedures: none Follow-Up: Your physician wants you to follow-up in: 1 year. ou will receive a reminder letter in the mail two months in advance. If you don't receive a letter, please call our office to schedule the follow-up appointment.   Any Other Special Instructions Will Be Listed Below (If Applicable).     If you need a refill on your cardiac medications before your next appointment, please call your pharmacy.

## 2019-08-11 ENCOUNTER — Telehealth: Payer: Self-pay | Admitting: Gastroenterology

## 2019-08-11 DIAGNOSIS — K5900 Constipation, unspecified: Secondary | ICD-10-CM

## 2019-08-11 MED ORDER — LUBIPROSTONE 24 MCG PO CAPS: ORAL_CAPSULE | ORAL | 3 refills | Status: DC

## 2019-08-11 NOTE — Telephone Encounter (Signed)
[phone]) SAID HER NEW MAIL IN PHARMACY IS [facility]  AND NEEDS AMITEZA SENT TO THEM

## 2019-08-11 NOTE — Telephone Encounter (Signed)
Sending to refill box.  

## 2019-08-11 NOTE — Addendum Note (Signed)
Addended by: Mahala Menghini on: 08/11/2019 01:02 PM   Modules accepted: Orders

## 2019-10-13 ENCOUNTER — Encounter: Payer: Self-pay | Admitting: Gastroenterology

## 2019-11-04 ENCOUNTER — Ambulatory Visit: Payer: Medicare HMO | Admitting: Gastroenterology

## 2019-11-04 ENCOUNTER — Encounter: Payer: Self-pay | Admitting: Gastroenterology

## 2019-11-04 ENCOUNTER — Other Ambulatory Visit: Payer: Self-pay

## 2019-11-04 VITALS — BP 157/78 | HR 68 | Temp 97.4°F | Ht 62.0 in | Wt 166.4 lb

## 2019-11-04 DIAGNOSIS — K219 Gastro-esophageal reflux disease without esophagitis: Secondary | ICD-10-CM

## 2019-11-04 DIAGNOSIS — K5900 Constipation, unspecified: Secondary | ICD-10-CM

## 2019-11-04 NOTE — Assessment & Plan Note (Signed)
Continue Amitiza 24 mcg twice daily.  Plan for colonoscopy first of the year due to limitations noted above during colonoscopy in 2015.  She was advised to consider 5-year follow-up as polyps less than 5 mm could have been missed.  We will schedule patient for February.  If she has concerns regarding Covid numbers at that time, she can cancel this elective procedure.   I have discussed the risks, alternatives, benefits with regards to but not limited to the risk of reaction to medication, bleeding, infection, perforation and the patient is agreeable to proceed. Written consent to be obtained.   Otherwise we will see her back in 1 year.Marland Kitchen

## 2019-11-04 NOTE — Patient Instructions (Addendum)
1. Colonoscopy as scheduled. See separate instructions. This is an elective procedure so if you are having concerns about COVID prior your procedure, you can cancel and plan after things settle done. Just keep [place] posted.  2. Continue Amitiza and Omeprazole.  3. Return to the office in 1 year or sooner if needed.

## 2019-11-04 NOTE — Assessment & Plan Note (Signed)
Doing well.  Continue omeprazole 40 milligrams daily as needed.  Return to the office in 1 year.

## 2019-11-04 NOTE — Progress Notes (Addendum)
REVIEWED-NO ADDITIONAL RECOMMENDATIONS.  Primary Care Physician: Andres Shad, MD  Primary Gastroenterologist:  Barney Drain, MD   Chief Complaint  Patient presents with  . Gastroesophageal Reflux    f/u. doing ok  . Constipation    f/u.    HPI: [name] [name] is a 73 y.o. female here for follow-up of GERD and constipation.  She was last seen in December 2019.  Last colonoscopy October 2015 with redundant left colon, moderate sized external hemorrhoids.  She has been on Amitiza 24 mcg twice daily, omeprazole 40 mg daily.  Clinically doing well.  Constipation and reflux well controlled.  Denies abdominal pain.  No melena or rectal bleeding.  No dysphagia.  No longer takes NSAIDs. Advised to stop with some elevation in creatinine.  Patient's last colonoscopy was in October 2015.  She had a redundant left colon, moderate sized external hemorrhoids.  She was advised to consider 5-year follow-up colonoscopy as polyps less than 5 mm may have been missed due to diminished view from oil droplets from broth consumed during her prep.  Current Outpatient Medications  Medication Sig Dispense Refill  . Febuxostat (ULORIC PO) Take 1 tablet by mouth daily.    . Fluticasone-Salmeterol (ADVAIR) 500-50 MCG/DOSE AEPB Inhale 1 puff into the lungs every 12 (twelve) hours.    . furosemide (LASIX) 20 MG tablet Take 20 mg by mouth every other day.     . hydrocortisone (PROCTOSOL HC) 2.5 % rectal cream PLACE RECTALLY 2 (TWO) TIMES DAILY. NO MORE THAN 14 DAYS AT A TIME. 28.35 g 3  . lubiprostone (AMITIZA) 24 MCG capsule TAKE 1 CAPSULE TWICE DAILY WITH A MEAL 180 capsule 3  . meclizine (ANTIVERT) 25 MG tablet Take 12.5-25 mg by mouth 3 (three) times daily as needed for dizziness.     . metoprolol succinate (TOPROL-XL) 25 MG 24 hr tablet TAKE 1 TABLET EVERY DAY 90 tablet 3  . omeprazole (PRILOSEC) 40 MG capsule Take 40 mg by mouth daily.      Marland Kitchen OVER THE COUNTER MEDICATION Fleets enema as needed     . potassium chloride (K-DUR) 10 MEQ tablet Take 20 mEq by mouth 3 (three) times daily.     . simvastatin (ZOCOR) 40 MG tablet Take 40 mg by mouth daily.      Marland Kitchen triamterene-hydrochlorothiazide (MAXZIDE) 75-50 MG per tablet Take 1 tablet by mouth daily.     No current facility-administered medications for this visit.     Allergies as of 11/04/2019 - Review Complete 11/04/2019  Allergen Reaction Noted  . Acetaminophen-codeine    . Allopurinol    . Tramadol     Past Medical History:  Diagnosis Date  . GERD (gastroesophageal reflux disease)   . Gout   . Hyperlipidemia   . Hypertension   . Hypokalemia   . PUD (peptic ulcer disease)   . Syncope    Neurocardiogenic   Past Surgical History:  Procedure Laterality Date  . ABDOMINAL HYSTERECTOMY    . CATARACT EXTRACTION Bilateral   . COLONOSCOPY N/A 10/07/2014   Dr. Oneida Alar: redundant left colon, moderate sized external hemorrhoids   . LIPOMA RESECTION     Family History  Problem Relation Age of Onset  . Stroke Mother   . Dementia Father   . Cancer - Other Sister    Social History   Tobacco Use  . Smoking status: Former Smoker    Packs/day: 1.00    Years: 20.00    Pack years: 20.00  Types: Cigarettes    Start date: 03/16/1964    Quit date: 10/07/1997    Years since quitting: 22.0  . Smokeless tobacco: Never Used  Substance Use Topics  . Alcohol use: No    Alcohol/week: 0.0 standard drinks  . Drug use: No    ROS:  General: Negative for anorexia, weight loss, fever, chills, fatigue, weakness. ENT: Negative for hoarseness, difficulty swallowing , nasal congestion. CV: Negative for chest pain, angina, palpitations, dyspnea on exertion, peripheral edema.  Respiratory: Negative for dyspnea at rest, dyspnea on exertion, cough, sputum, wheezing.  GI: See history of present illness. GU:  Negative for dysuria, hematuria, urinary incontinence, urinary frequency, nocturnal urination.  Endo: Negative for unusual weight change.     Physical Examination:   BP (!) 157/78   Pulse 68   Temp (!) 97.4 F (36.3 C) (Oral)   Ht 5\' 2"  (1.575 m)   Wt 166 lb 6.4 oz (75.5 kg)   BMI 30.43 kg/m   General: Well-nourished, well-developed in no acute distress.  Eyes: No icterus. Mouth: Oropharyngeal mucosa moist and pink , no lesions erythema or exudate. Lungs: Clear to auscultation bilaterally.  Heart: Regular rate and rhythm, no murmurs rubs or gallops.  Abdomen: Bowel sounds are normal, nontender, nondistended, no hepatosplenomegaly or masses, no abdominal bruits or hernia , no rebound or guarding.   Extremities: No lower extremity edema. No clubbing or deformities. Neuro: Alert and oriented x 4   Skin: Warm and dry, no jaundice.   Psych: Alert and cooperative, normal mood and affect.  Labs:  Lab Results  Component Value Date   WBC 6.1 05/06/2019   HGB 15.5 (H) 05/06/2019   HCT 48.0 (H) 05/06/2019   MCV 87.6 05/06/2019   PLT 294 05/06/2019    Imaging Studies: No results found.

## 2019-11-23 ENCOUNTER — Other Ambulatory Visit: Payer: Self-pay | Admitting: *Deleted

## 2019-11-23 ENCOUNTER — Telehealth: Payer: Self-pay | Admitting: *Deleted

## 2019-11-23 DIAGNOSIS — Z1211 Encounter for screening for malignant neoplasm of colon: Secondary | ICD-10-CM

## 2019-11-23 MED ORDER — NA SULFATE-K SULFATE-MG SULF 17.5-3.13-1.6 GM/177ML PO SOLN: 1.0000 | Freq: Once | ORAL | 0 refills | Status: AC

## 2019-11-23 NOTE — Telephone Encounter (Signed)
Called pt. Procedure scheduled for 3/19 at 9:30am. I also scheduled COVID testing appt with pt for 3/17 at 10:00am. Patient aware will mail instructions with appts to her. Confirmed address. Rx sent to pharmacy. Patient did not want trylite. Orders entered.

## 2020-02-18 ENCOUNTER — Telehealth: Payer: Self-pay

## 2020-02-18 NOTE — Telephone Encounter (Signed)
Pt is aware.  

## 2020-02-18 NOTE — Telephone Encounter (Signed)
She can have her vaccine before. I would recommend not getting within 48 hours of colonoscopy just in case it makes her feel tired or sore.

## 2020-02-18 NOTE — Telephone Encounter (Addendum)
PT said she is scheduled for Colonoscopy 03/11/2020 and wants to know if she should have her Covid vaccination before or after procedure.  Magda Paganini, please advise!

## 2020-03-09 ENCOUNTER — Telehealth: Payer: Self-pay

## 2020-03-09 ENCOUNTER — Other Ambulatory Visit (HOSPITAL_COMMUNITY)
Admission: RE | Admit: 2020-03-09 | Discharge: 2020-03-09 | Disposition: A | Discharge status: Home / Self Care | Payer: Medicare HMO | Source: Ambulatory Visit | Attending: Gastroenterology | Admitting: Gastroenterology

## 2020-03-09 ENCOUNTER — Other Ambulatory Visit: Payer: Self-pay

## 2020-03-09 DIAGNOSIS — Z01812 Encounter for preprocedural laboratory examination: Secondary | ICD-10-CM | POA: Insufficient documentation

## 2020-03-09 DIAGNOSIS — Z20822 Contact with and (suspected) exposure to covid-19: Secondary | ICD-10-CM | POA: Diagnosis not present

## 2020-03-09 LAB — SARS CORONAVIRUS 2 (TAT 6-24 HRS): SARS Coronavirus 2: NEGATIVE

## 2020-03-09 NOTE — Telephone Encounter (Signed)
Pt is scheduled for her TCS this week and needs her prep sent to her pharmacy.

## 2020-03-10 MED ORDER — NA SULFATE-K SULFATE-MG SULF 17.5-3.13-1.6 GM/177ML PO SOLN: 1.0000 | Freq: Once | ORAL | 0 refills | Status: AC

## 2020-03-10 NOTE — Telephone Encounter (Signed)
RX has been resent in

## 2020-03-10 NOTE — Addendum Note (Signed)
Addended by: Cheron Every on: 03/10/2020 07:34 AM   Modules accepted: Orders

## 2020-03-11 ENCOUNTER — Other Ambulatory Visit: Payer: Self-pay

## 2020-03-11 ENCOUNTER — Ambulatory Visit (HOSPITAL_COMMUNITY)
Admission: RE | Admit: 2020-03-11 | Discharge: 2020-03-11 | Disposition: A | Discharge status: Home / Self Care | Payer: Medicare HMO | Attending: Gastroenterology | Admitting: Gastroenterology

## 2020-03-11 ENCOUNTER — Encounter (HOSPITAL_COMMUNITY): Payer: Self-pay | Admitting: Gastroenterology

## 2020-03-11 ENCOUNTER — Encounter (HOSPITAL_COMMUNITY)
Admission: RE | Disposition: A | Discharge status: Home / Self Care | Payer: Self-pay | Source: Home / Self Care | Attending: Gastroenterology

## 2020-03-11 DIAGNOSIS — Z820 Family history of epilepsy and other diseases of the nervous system: Secondary | ICD-10-CM | POA: Diagnosis not present

## 2020-03-11 DIAGNOSIS — D123 Benign neoplasm of transverse colon: Secondary | ICD-10-CM | POA: Insufficient documentation

## 2020-03-11 DIAGNOSIS — Z823 Family history of stroke: Secondary | ICD-10-CM | POA: Diagnosis not present

## 2020-03-11 DIAGNOSIS — Z9841 Cataract extraction status, right eye: Secondary | ICD-10-CM | POA: Diagnosis not present

## 2020-03-11 DIAGNOSIS — Z8711 Personal history of peptic ulcer disease: Secondary | ICD-10-CM | POA: Diagnosis not present

## 2020-03-11 DIAGNOSIS — Z79899 Other long term (current) drug therapy: Secondary | ICD-10-CM | POA: Insufficient documentation

## 2020-03-11 DIAGNOSIS — Z888 Allergy status to other drugs, medicaments and biological substances status: Secondary | ICD-10-CM | POA: Insufficient documentation

## 2020-03-11 DIAGNOSIS — D125 Benign neoplasm of sigmoid colon: Secondary | ICD-10-CM | POA: Diagnosis not present

## 2020-03-11 DIAGNOSIS — Z809 Family history of malignant neoplasm, unspecified: Secondary | ICD-10-CM | POA: Insufficient documentation

## 2020-03-11 DIAGNOSIS — Z1211 Encounter for screening for malignant neoplasm of colon: Secondary | ICD-10-CM

## 2020-03-11 DIAGNOSIS — E876 Hypokalemia: Secondary | ICD-10-CM | POA: Diagnosis not present

## 2020-03-11 DIAGNOSIS — D124 Benign neoplasm of descending colon: Secondary | ICD-10-CM | POA: Diagnosis not present

## 2020-03-11 DIAGNOSIS — K219 Gastro-esophageal reflux disease without esophagitis: Secondary | ICD-10-CM | POA: Insufficient documentation

## 2020-03-11 DIAGNOSIS — Z9842 Cataract extraction status, left eye: Secondary | ICD-10-CM | POA: Insufficient documentation

## 2020-03-11 DIAGNOSIS — Z90710 Acquired absence of both cervix and uterus: Secondary | ICD-10-CM | POA: Insufficient documentation

## 2020-03-11 DIAGNOSIS — I10 Essential (primary) hypertension: Secondary | ICD-10-CM | POA: Insufficient documentation

## 2020-03-11 DIAGNOSIS — Z87891 Personal history of nicotine dependence: Secondary | ICD-10-CM | POA: Insufficient documentation

## 2020-03-11 DIAGNOSIS — E785 Hyperlipidemia, unspecified: Secondary | ICD-10-CM | POA: Insufficient documentation

## 2020-03-11 DIAGNOSIS — Q438 Other specified congenital malformations of intestine: Secondary | ICD-10-CM | POA: Diagnosis not present

## 2020-03-11 DIAGNOSIS — K635 Polyp of colon: Secondary | ICD-10-CM

## 2020-03-11 DIAGNOSIS — D120 Benign neoplasm of cecum: Secondary | ICD-10-CM | POA: Diagnosis not present

## 2020-03-11 DIAGNOSIS — M109 Gout, unspecified: Secondary | ICD-10-CM | POA: Insufficient documentation

## 2020-03-11 DIAGNOSIS — K620 Anal polyp: Secondary | ICD-10-CM | POA: Insufficient documentation

## 2020-03-11 HISTORY — PX: COLONOSCOPY: SHX5424

## 2020-03-11 HISTORY — PX: POLYPECTOMY: SHX5525

## 2020-03-11 SURGERY — COLONOSCOPY
Anesthesia: Moderate Sedation

## 2020-03-11 MED ORDER — STERILE WATER FOR IRRIGATION IR SOLN
Status: DC | PRN
  Administered 2020-03-11: 2.5 mL

## 2020-03-11 MED ORDER — MEPERIDINE HCL 100 MG/ML IJ SOLN
INTRAMUSCULAR | Status: DC | PRN
  Administered 2020-03-11 (×2): 25 mg via INTRAVENOUS

## 2020-03-11 MED ORDER — MEPERIDINE HCL 100 MG/ML IJ SOLN
INTRAMUSCULAR | Status: AC
  Filled 2020-03-11: qty 2

## 2020-03-11 MED ORDER — SODIUM CHLORIDE 0.9 % IV SOLN: INTRAVENOUS | Status: DC

## 2020-03-11 MED ORDER — MIDAZOLAM HCL 5 MG/5ML IJ SOLN
INTRAMUSCULAR | Status: DC | PRN
  Administered 2020-03-11: 1 mg via INTRAVENOUS
  Administered 2020-03-11: 2 mg via INTRAVENOUS
  Administered 2020-03-11: 1 mg via INTRAVENOUS

## 2020-03-11 MED ORDER — MIDAZOLAM HCL 5 MG/5ML IJ SOLN
INTRAMUSCULAR | Status: AC
  Filled 2020-03-11: qty 10

## 2020-03-11 NOTE — H&P (Signed)
Primary Care Physician:  Andres Shad, MD Primary Gastroenterologist:  Dr. Oneida Alar  Pre-Procedure History & Physical: HPI:  [name] [name] is a 74 y.o. female here for [place].  Past Medical History:  Diagnosis Date  . GERD (gastroesophageal reflux disease)   . Gout   . Hyperlipidemia   . Hypertension   . Hypokalemia   . PUD (peptic ulcer disease)   . Syncope    Neurocardiogenic    Past Surgical History:  Procedure Laterality Date  . ABDOMINAL HYSTERECTOMY    . CATARACT EXTRACTION Bilateral   . COLONOSCOPY N/A 10/07/2014   Dr. Oneida Alar: redundant left colon, moderate sized external hemorrhoids   . LIPOMA RESECTION      Prior to Admission medications   Medication Sig Start Date End Date Taking? Authorizing Provider  acetaminophen (TYLENOL) 500 MG tablet Take 1,000 mg by mouth every 6 (six) hours as needed for moderate pain.   Yes [provider]  APPLE CIDER VINEGAR PO Take 2 capsules by mouth daily.   Yes [provider]  cetirizine (ZYRTEC) 10 MG tablet Take 10 mg by mouth daily as needed for allergies.   Yes [provider]  colchicine 0.6 MG tablet Take 0.6 mg by mouth daily. 01/31/20  Yes [provider]  diclofenac Sodium (VOLTAREN) 1 % GEL Apply 1 application topically 4 (four) times daily as needed (pain).   Yes [provider]  ELDERBERRY PO Take 2 capsules by mouth daily.   Yes [provider]  fluticasone (FLONASE) 50 MCG/ACT nasal spray Place 1 spray into both nostrils daily as needed for allergies or rhinitis.   Yes [provider]  Fluticasone-Salmeterol (ADVAIR) 500-50 MCG/DOSE AEPB Inhale 1 puff into the lungs every 12 (twelve) hours.   Yes [provider]  furosemide (LASIX) 20 MG tablet Take 20 mg by mouth every other day.    Yes [provider]  hydrocortisone (PROCTOSOL HC) 2.5 % rectal cream PLACE RECTALLY 2 (TWO) TIMES DAILY. NO MORE THAN 14 DAYS AT A  TIME. Patient taking differently: Place 1 application rectally 2 (two) times daily as needed for hemorrhoids.  12/16/18  Yes Mahala Menghini, PA-C  lubiprostone (AMITIZA) 24 MCG capsule TAKE 1 CAPSULE TWICE DAILY WITH A MEAL Patient taking differently: Take 24 mcg by mouth in the morning and at bedtime.  08/11/19  Yes Mahala Menghini, PA-C  meclizine (ANTIVERT) 25 MG tablet Take 25 mg by mouth 3 (three) times daily as needed for dizziness.    Yes [provider]  metoprolol succinate (TOPROL-XL) 25 MG 24 hr tablet TAKE 1 TABLET EVERY DAY Patient taking differently: Take 25 mg by mouth daily.  01/08/17  Yes Satira Sark, MD  omeprazole (PRILOSEC) 40 MG capsule Take 40 mg by mouth daily.     Yes [provider]  potassium chloride SA (KLOR-CON) 20 MEQ tablet Take 20 mEq by mouth in the morning, at noon, and at bedtime.   Yes [provider]  simvastatin (ZOCOR) 40 MG tablet Take 40 mg by mouth daily in the afternoon.    Yes [provider]  triamterene-hydrochlorothiazide (MAXZIDE) 75-50 MG per tablet Take 1 tablet by mouth daily.   Yes [provider]  vitamin C (ASCORBIC ACID) 250 MG tablet Take 500 mg by mouth daily.   Yes [provider]    Allergies as of 11/23/2019 - Review Complete 11/04/2019  Allergen Reaction Noted  . Acetaminophen-codeine    . Allopurinol    .  Tramadol      Family History  Problem Relation Age of Onset  . Stroke Mother   . Dementia Father   . Cancer - Other Sister     Social History   Socioeconomic History  . Marital status: Single    Spouse name: Not on file  . Number of children: Not on file  . Years of education: Not on file  . Highest education level: Not on file  Occupational History  . Occupation: Retired  Tobacco Use  . Smoking status: Former Smoker    Packs/day: 1.00    Years: 20.00    Pack years: 20.00    Types: Cigarettes    Start date: 03/16/1964    Quit date: 10/07/1997    Years  since quitting: 22.4  . Smokeless tobacco: Never Used  Substance and Sexual Activity  . Alcohol use: No    Alcohol/week: 0.0 standard drinks  . Drug use: No  . Sexual activity: Yes    Partners: Male  Other Topics Concern  . Not on file  Social History Narrative  . Not on file   Social Determinants of Health   Financial Resource Strain:   . Difficulty of Paying Living Expenses:   Food Insecurity:   . Worried About Charity fundraiser in the Last Year:   . Arboriculturist in the Last Year:   Transportation Needs:   . Film/video editor (Medical):   Marland Kitchen Lack of Transportation (Non-Medical):   Physical Activity:   . Days of Exercise per Week:   . Minutes of Exercise per Session:   Stress:   . Feeling of Stress :   Social Connections:   . Frequency of Communication with Friends and Family:   . Frequency of Social Gatherings with Friends and Family:   . Attends Religious Services:   . Active Member of Clubs or Organizations:   . Attends Archivist Meetings:   Marland Kitchen Marital Status:   Intimate Partner Violence:   . Fear of Current or Ex-Partner:   . Emotionally Abused:   Marland Kitchen Physically Abused:   . Sexually Abused:     Review of Systems: See HPI, otherwise negative ROS   Physical Exam: BP (!) 155/65   Pulse 79   Temp 98.2 F (36.8 C) (Oral)   Resp 13   SpO2 100%  General:   Alert,  pleasant and cooperative in NAD Head:  Normocephalic and atraumatic. Neck:  Supple; Lungs:  Clear throughout to auscultation.    Heart:  Regular rate and rhythm. Abdomen:  Soft, nontender and nondistended. Normal bowel sounds, without guarding, and without rebound.   Neurologic:  Alert and  oriented x4;  grossly normal neurologically.  Impression/Plan:    SCREENING  Plan:  1. TCS TODAY DISCUSSED PROCEDURE, BENEFITS, & RISKS: < 1% chance of medication reaction, bleeding, perforation, ASPIRATION, or rupture of spleen/liver requiring surgery to fix it and missed polyps < 1 cm  10-20% of the time.

## 2020-03-11 NOTE — Op Note (Signed)
[facility] Patient Name: [name] [name] Procedure Date: 03/11/2020 11:19 AM MRN: [id number] Date of Birth: 1946/06/20 Attending MD: Barney Drain MD, MD CSN: [id number] Age: 74 Admit Type: Outpatient Procedure:                Colonoscopy WITH COLD FORCEPS BIOPS/COLD SNARE                            POLYPECTOMY Indications:              Screening for colorectal malignant neoplasm Providers:                Barney Drain MD, MD, Lurline Del, RN, Aram Candela Referring MD:             Georgeanna Lea. Winfield MD, MD Medicines:                Meperidine 50 mg IV, Midazolam 4 mg IV Complications:            No immediate complications. Estimated Blood Loss:     Estimated blood loss was minimal. Procedure:                Pre-Anesthesia Assessment:                           - Prior to the procedure, a History and Physical                            was performed, and patient medications and                            allergies were reviewed. The patient's tolerance of                            previous anesthesia was also reviewed. The risks                            and benefits of the procedure and the sedation                            options and risks were discussed with the patient.                            All questions were answered, and informed consent                            was obtained. Prior Anticoagulants: The patient has                            taken no previous anticoagulant or antiplatelet                            agents. ASA Grade Assessment: II - A patient with                            mild systemic disease. After reviewing the risks  and benefits, the patient was deemed in                            satisfactory condition to undergo the procedure.                            After obtaining informed consent, the colonoscope                            was passed under direct vision. Throughout the                            procedure, the  patient's blood pressure, pulse, and                            oxygen saturations were monitored continuously. The                            [id number]-H190DL ([id number]) scope was introduced through                            the anus and advanced to the the cecum, identified                            by appendiceal orifice and ileocecal valve. The                            colonoscopy was somewhat difficult due to a                            tortuous colon. Successful completion of the                            procedure was aided by straightening and shortening                            the scope to obtain bowel loop reduction and                            COLOWRAP. The patient tolerated the procedure well.                            The quality of the bowel preparation was good. The                            ileocecal valve, appendiceal orifice, and rectum                            were photographed. Scope In: 11:49:42 AM Scope Out: 12:18:09 PM Scope Withdrawal Time: 0 hours 24 minutes 48 seconds  Total Procedure Duration: 0 hours 28 minutes 27 seconds  Findings:      Nine sessile polyps were found in the sigmoid colon, mid descending       colon, mid transverse  colon and cecum. The polyps were 3 to 7 mm in       size. These polyps were removed with a cold snare. Resection and       retrieval were complete.      A localized area of mildly nodular mucosa was found at the anus.       Biopsies were taken with a cold forceps for histology.      The recto-sigmoid colon, sigmoid colon and descending colon were       moderately tortuous. Impression:               - Nine 3 to 7 mm polyps in the sigmoid colon(4), in                            the mid descending colon, in the mid transverse                            colon and in the cecum(3), removed with a cold                            snare. Resected and retrieved.                           - Nodular mucosa at the anus. Biopsied.                            - Tortuous LEFT colon. Moderate Sedation:      Moderate (conscious) sedation was administered by the endoscopy nurse       and supervised by the endoscopist. The following parameters were       monitored: oxygen saturation, heart rate, blood pressure, and response       to care. Total physician intraservice time was 40 minutes. Recommendation:           - Patient has a contact number available for                            emergencies. The signs and symptoms of potential                            delayed complications were discussed with the                            patient. Return to normal activities tomorrow.                            Written discharge instructions were provided to the                            patient.                           - High fiber diet.                           - Continue present medications.                           -  Await pathology results.                           - Repeat colonoscopy is not recommended due to                            current age (66 years or older) for surveillance. Procedure Code(s):        --- Professional ---                           [phone], Colonoscopy, flexible; with removal of                            tumor(s), polyp(s), or other lesion(s) by snare                            technique                           45380, 59, Colonoscopy, flexible; with biopsy,                            single or multiple                           99153, Moderate sedation; each additional 15                            minutes intraservice time                           99153, Moderate sedation; each additional 15                            minutes intraservice time                           G0500, Moderate sedation services provided by the                            same physician or other qualified health care                            professional performing a gastrointestinal                            endoscopic  service that sedation supports,                            requiring the presence of an independent trained                            observer to assist in the monitoring of the                            patient's level of consciousness and physiological  status; initial 15 minutes of intra-service time;                            patient age 34 years or older (additional time may                            be reported with [phone], as appropriate) Diagnosis Code(s):        --- Professional ---                           Z12.11, Encounter for screening for malignant                            neoplasm of colon                           K63.5, Polyp of colon                           K62.89, Other specified diseases of anus and rectum                           Q43.8, Other specified congenital malformations of                            intestine CPT copyright 2019 [employer]. All rights reserved. The codes documented in this report are preliminary and upon coder review may  be revised to meet current compliance requirements. Barney Drain, MD Barney Drain MD, MD 03/11/2020 12:31:48 PM This report has been signed electronically. Number of Addenda: 0

## 2020-03-11 NOTE — Discharge Instructions (Signed)
You had 9 small polyps removed. 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   DRINK WATER TO KEEP YOUR URINE LIGHT YELLOW.  FOLLOW A HIGH FIBER DIET. AVOID ITEMS THAT CAUSE BLOATING & GAS. SEE INFO BELOW.   YOUR BIOPSY RESULTS WILL BE BACK IN 5 BUSINESS DAYS.  We do not routinely screen for polyps after the age of 55.    Colonoscopy Care After Read the instructions outlined below and refer to this sheet in the next week. These discharge instructions provide you with general information on caring for yourself after you leave the hospital. While your treatment has been planned according to the most current medical practices available, unavoidable complications occasionally occur. If you have any problems or questions after discharge, call DR. Jhair Witherington, [phone].  ACTIVITY  You may resume your regular activity, but move at a slower pace for the next 24 hours.   Take frequent rest periods for the next 24 hours.   Walking will help get rid of the air and reduce the bloated feeling in your belly (abdomen).   No driving for 24 hours (because of the medicine (anesthesia) used during the test).   You may shower.   Do not sign any important legal documents or operate any machinery for 24 hours (because of the anesthesia used during the test).    NUTRITION  Drink plenty of fluids.   You may resume your normal diet as instructed by your doctor.   Begin with a light meal and progress to your normal diet. Heavy or fried foods are harder to digest and may make you feel sick to your stomach (nauseated).   Avoid alcoholic beverages for 24 hours or as instructed.    MEDICATIONS  You may resume your normal medications.   WHAT YOU CAN EXPECT TODAY  Some feelings of bloating in the abdomen.   Passage of more gas than usual.   Spotting of blood in your stool or on the toilet paper  .  IF YOU HAD POLYPS REMOVED DURING THE COLONOSCOPY:  Eat a soft diet IF YOU HAVE NAUSEA, BLOATING, ABDOMINAL PAIN,  OR VOMITING.    FINDING OUT THE RESULTS OF YOUR TEST Not all test results are available during your visit. DR. Oneida Alar WILL CALL YOU WITHIN 14 DAYS OF YOUR PROCEDUE WITH YOUR RESULTS. Do not assume everything is normal if you have not heard from DR. Massie Cogliano, CALL HER OFFICE AT [phone].  SEEK IMMEDIATE MEDICAL ATTENTION AND CALL THE OFFICE: [phone] IF:  You have more than a spotting of blood in your stool.   Your belly is swollen (abdominal distention).   You are nauseated or vomiting.   You have a temperature over 101F.   You have abdominal pain or discomfort that is severe or gets worse throughout the day.   High-Fiber Diet A high-fiber diet changes your normal diet to include more whole grains, legumes, fruits, and vegetables. Changes in the diet involve replacing refined carbohydrates with unrefined foods. The calorie level of the diet is essentially unchanged. The Dietary Reference Intake (recommended amount) for adult males is 38 grams per day. For adult females, it is 25 grams per day. Pregnant and lactating women should consume 28 grams of fiber per day. Fiber is the intact part of a plant that is not broken down during digestion. Functional fiber is fiber that has been isolated from the plant to provide a beneficial effect in the body.  PURPOSE Increase stool bulk.  Ease and regulate bowel movements.  Lower cholesterol.  REDUCE RISK OF COLON CANCER  INDICATIONS THAT YOU NEED MORE FIBER Constipation and hemorrhoids.  Uncomplicated diverticulosis (intestine condition) and irritable bowel syndrome.  Weight management.  As a protective measure against hardening of the arteries (atherosclerosis), diabetes, and cancer.   GUIDELINES FOR INCREASING FIBER IN THE DIET Start adding fiber to the diet slowly. A gradual increase of about 5 more grams (2 servings of most fruits or vegetables) per day is best. Too rapid an increase in fiber may result in constipation, flatulence,  and bloating.  Drink enough water and fluids to keep your urine clear or pale yellow. Water, juice, or caffeine-free drinks are recommended. Not drinking enough fluid may cause constipation.  Eat a variety of high-fiber foods rather than one type of fiber.  Try to increase your intake of fiber through using high-fiber foods rather than fiber pills or supplements that contain small amounts of fiber.  The goal is to change the types of food eaten. Do not supplement your present diet with high-fiber foods, but replace foods in your present diet.    Polyps, Colon  A polyp is extra tissue that grows inside your body. Colon polyps grow in the large intestine. The large intestine, also called the colon, is part of your digestive system. It is a long, hollow tube at the end of your digestive tract where your body makes and stores stool. Most polyps are not dangerous. They are benign. This means they are not cancerous. But over time, some types of polyps can turn into cancer. Polyps that are smaller than a pea are usually not harmful. But larger polyps could someday become or may already be cancerous. To be safe, doctors remove all polyps and test them.   WHO GETS POLYPS? Anyone can get polyps, but certain people are more likely than others. You may have a greater chance of getting polyps if:  You are over 50.   You have had polyps before.   Someone in your family has had polyps.   Someone in your family has had cancer of the large intestine.   Find out if someone in your family has had polyps. You may also be more likely to get polyps if you:   Eat a lot of fatty foods   Smoke   Drink alcohol   Do not exercise  Eat too much   PREVENTION There is not one sure way to prevent polyps. You might be able to lower your risk of getting them if you:  Eat more fruits and vegetables and less fatty food.   Do not smoke.   Avoid alcohol.   Exercise every day.   Lose weight if you are overweight.     Eating more calcium and folate can also lower your risk of getting polyps. Some foods that are rich in calcium are milk, cheese, and broccoli. Some foods that are rich in folate are chickpeas, kidney beans, and spinach.

## 2020-03-14 LAB — SURGICAL PATHOLOGY

## 2020-04-19 ENCOUNTER — Telehealth: Payer: Self-pay | Admitting: Gastroenterology

## 2020-04-19 NOTE — Telephone Encounter (Signed)
Called verified pt name and dob Notified pt of lab results that were reviewed by doctor fields on 03/08/20 Pt thanked me for the call

## 2020-04-19 NOTE — Telephone Encounter (Signed)
Pt had procedure by [facility] on 3/19 and was calling to see if her results were available. [phone]

## 2020-04-28 ENCOUNTER — Encounter (HOSPITAL_COMMUNITY): Payer: Self-pay | Admitting: Emergency Medicine

## 2020-04-28 ENCOUNTER — Other Ambulatory Visit: Payer: Self-pay

## 2020-04-28 ENCOUNTER — Emergency Department (HOSPITAL_COMMUNITY): Payer: Medicare HMO

## 2020-04-28 ENCOUNTER — Emergency Department (HOSPITAL_COMMUNITY)
Admission: EM | Admit: 2020-04-28 | Discharge: 2020-04-28 | Disposition: A | Discharge status: Home / Self Care | Payer: Medicare HMO | Attending: Emergency Medicine | Admitting: Emergency Medicine

## 2020-04-28 DIAGNOSIS — Z87891 Personal history of nicotine dependence: Secondary | ICD-10-CM | POA: Insufficient documentation

## 2020-04-28 DIAGNOSIS — I10 Essential (primary) hypertension: Secondary | ICD-10-CM | POA: Insufficient documentation

## 2020-04-28 DIAGNOSIS — I4891 Unspecified atrial fibrillation: Secondary | ICD-10-CM | POA: Diagnosis not present

## 2020-04-28 DIAGNOSIS — R002 Palpitations: Secondary | ICD-10-CM | POA: Diagnosis present

## 2020-04-28 DIAGNOSIS — R079 Chest pain, unspecified: Secondary | ICD-10-CM

## 2020-04-28 LAB — TROPONIN I (HIGH SENSITIVITY)
Troponin I (High Sensitivity): 55 ng/L — ABNORMAL HIGH (ref <18)
Troponin I (High Sensitivity): 50 ng/L — ABNORMAL HIGH (ref <18)

## 2020-04-28 LAB — CBC
HCT: 47.4 % — ABNORMAL HIGH (ref 36.0–46.0)
Hemoglobin: 15 g/dL (ref 12.0–15.0)
MCH: 27.7 pg (ref 26.0–34.0)
MCHC: 31.6 g/dL (ref 30.0–36.0)
MCV: 87.5 fL (ref 80.0–100.0)
Platelets: 300 10*3/uL (ref 150–400)
RBC: 5.42 MIL/uL — ABNORMAL HIGH (ref 3.87–5.11)
RDW: 13.6 % (ref 11.5–15.5)
WBC: 6.4 10*3/uL (ref 4.0–10.5)
nRBC: 0 % (ref 0.0–0.2)

## 2020-04-28 LAB — BASIC METABOLIC PANEL
Anion gap: 12 (ref 5–15)
BUN: 19 mg/dL (ref 8–23)
CO2: 27 mmol/L (ref 22–32)
Calcium: 9.1 mg/dL (ref 8.9–10.3)
Chloride: 94 mmol/L — ABNORMAL LOW (ref 98–111)
Creatinine, Ser: 1.33 mg/dL — ABNORMAL HIGH (ref 0.44–1.00)
GFR calc Af Amer: 46 mL/min — ABNORMAL LOW (ref >60)
GFR calc non Af Amer: 39 mL/min — ABNORMAL LOW (ref >60)
Glucose, Bld: 109 mg/dL — ABNORMAL HIGH (ref 70–99)
Potassium: 3.3 mmol/L — ABNORMAL LOW (ref 3.5–5.1)
Sodium: 133 mmol/L — ABNORMAL LOW (ref 135–145)

## 2020-04-28 MED ORDER — ETOMIDATE 2 MG/ML IV SOLN
10.0000 mg | Freq: Once | INTRAVENOUS | Status: DC
  Filled 2020-04-28: qty 10

## 2020-04-28 MED ORDER — APIXABAN 5 MG PO TABS: 5.0000 mg | ORAL_TABLET | Freq: Two times a day (BID) | ORAL | 0 refills | Status: DC

## 2020-04-28 MED ORDER — DILTIAZEM HCL 25 MG/5ML IV SOLN
20.0000 mg | Freq: Once | INTRAVENOUS | Status: AC
  Administered 2020-04-28: 12:00:00 20 mg via INTRAVENOUS
  Filled 2020-04-28: qty 5

## 2020-04-28 NOTE — ED Triage Notes (Signed)
Chest pain that began last night that the patient describes as soreness with shortness of breath. Denies more pain with exertion. Denies N/V and diaphoresis.

## 2020-04-28 NOTE — Discharge Instructions (Addendum)
You were evaluated in the Emergency Department and after careful evaluation, we did not find any emergent condition requiring admission or further testing in the hospital.  Your exam/testing today was overall reassuring.  Your symptoms seem to be due to atrial fibrillation with a fast heart rate.  We gave you a medicine called diltiazem which was able to control your heart rate and switch your heart back to a normal rhythm.  We discussed your case with Dr. Domenic Polite of the cardiology team.  He and his team would like to see you within the next few days and they will reach out to you for an appointment.  Until then please continue taking your metoprolol and other daily medications as directed.  We are starting you on a new medication called Eliquis.  This is the blood thinner that we talked about.  It will be important to avoid trauma or falls while on this medication as it can cause you to bleed more easily.  Please return to the Emergency Department if you experience any worsening of your condition.  We encourage you to follow up with a primary care provider.  Thank you for allowing [place] to be a part of your care.

## 2020-04-28 NOTE — Progress Notes (Signed)
   Progress Note  Patient Name: [name] [name] Date of Encounter: 04/28/2020  Primary Cardiologist: Rozann Lesches, MD  Case discussed with Dr. Sedonia Small.  Patient seen in the ER today with newly documented rapid atrial fibrillation.  She was given intravenous diltiazem and ultimately, spontaneously converted to sinus rhythm.  She has no clearly documented history of recent atrial fibrillation.  I last saw her in the office back in June 2020.  She has a history of hypertension and also vasovagal syncope.  She has been on Toprol-XL 25 mg daily and generally her resting heart rate is in the 50s to 60s in sinus rhythm.  CHA2DS2-VASc score is at least 3.  Recommend starting Eliquis 5 mg twice daily for stroke prophylaxis, continue Toprol-XL 25 mg daily for now without addition of further AV nodal blockers.  We will schedule an office visit in [place].  Might consider further monitoring with a Zio patch.  If recurring arrhythmia becomes an issue, antiarrhythmic therapy will likely need to be pursued.  I doubt there is much room for further up titration of AV nodal blockers.  Signed, Rozann Lesches, MD  04/28/2020, 1:30 PM

## 2020-04-28 NOTE — Plan of Care (Signed)
Pt has appointment scheduled with cardiology 05/03/20 at 3:30p

## 2020-04-28 NOTE — ED Provider Notes (Signed)
[facility] Emergency Department Provider Note MRN:  [id number]  Arrival date & time: 04/28/20     Chief Complaint   Palpitations History of Present Illness   [name] [name] is a 74 y.o. year-old female with a history of hypertension presenting to the ED with chief complaint of palpitations  Sudden onset palpitations and shortness of breath yesterday evening.  Felt like her heart was racing.  No chest pain or pressure.  No recent illness or fever or cough, no abdominal pain.  Symptoms constant, mild to moderate.  Review of Systems  A complete 10 system review of systems was obtained and all systems are negative except as noted in the HPI and PMH.   Patient's Health History    Past Medical History:  Diagnosis Date  . GERD (gastroesophageal reflux disease)   . Gout   . Hyperlipidemia   . Hypertension   . Hypokalemia   . PUD (peptic ulcer disease)   . Syncope    Neurocardiogenic    Past Surgical History:  Procedure Laterality Date  . ABDOMINAL HYSTERECTOMY    . CATARACT EXTRACTION Bilateral   . COLONOSCOPY N/A 10/07/2014   Dr. Oneida Alar: redundant left colon, moderate sized external hemorrhoids   . COLONOSCOPY N/A 03/11/2020   Procedure: COLONOSCOPY;  Surgeon: Danie Binder, MD;  Location: [facility];  Service: Endoscopy;  Laterality: N/A;  9:30am w/ overtube  . LIPOMA RESECTION    . POLYPECTOMY  03/11/2020   Procedure: POLYPECTOMY;  Surgeon: Danie Binder, MD;  Location: [facility];  Service: Endoscopy;;  cecal, transverse, descending, sigmoid    Family History  Problem Relation Age of Onset  . Stroke Mother   . Dementia Father   . Cancer - Other Sister     Social History   Socioeconomic History  . Marital status: Single    Spouse name: Not on file  . Number of children: Not on file  . Years of education: Not on file  . Highest education level: Not on file  Occupational History  . Occupation: Retired  Tobacco Use  . Smoking  status: Former Smoker    Packs/day: 1.00    Years: 20.00    Pack years: 20.00    Types: Cigarettes    Start date: 03/16/1964    Quit date: 10/07/1997    Years since quitting: 22.5  . Smokeless tobacco: Never Used  Substance and Sexual Activity  . Alcohol use: No    Alcohol/week: 0.0 standard drinks  . Drug use: No  . Sexual activity: Yes    Partners: Male  Other Topics Concern  . Not on file  Social History Narrative  . Not on file   Social Determinants of Health   Financial Resource Strain:   . Difficulty of Paying Living Expenses:   Food Insecurity:   . Worried About Charity fundraiser in the Last Year:   . Arboriculturist in the Last Year:   Transportation Needs:   . Film/video editor (Medical):   Marland Kitchen Lack of Transportation (Non-Medical):   Physical Activity:   . Days of Exercise per Week:   . Minutes of Exercise per Session:   Stress:   . Feeling of Stress :   Social Connections:   . Frequency of Communication with Friends and Family:   . Frequency of Social Gatherings with Friends and Family:   . Attends Religious Services:   . Active Member of Clubs or Organizations:   .  Attends Archivist Meetings:   Marland Kitchen Marital Status:   Intimate Partner Violence:   . Fear of Current or Ex-Partner:   . Emotionally Abused:   Marland Kitchen Physically Abused:   . Sexually Abused:      Physical Exam   Vitals:   04/28/20 1200 04/28/20 1300  BP: (!) 150/79 124/68  Pulse: 93 (!) 51  Resp: 17 20  SpO2: 98% 98%    CONSTITUTIONAL: Well-appearing, NAD NEURO:  Alert and oriented x 3, no focal deficits EYES:  eyes equal and reactive ENT/NECK:  no LAD, no JVD CARDIO: Tachycardic and irregular rate, well-perfused, normal S1 and S2 PULM:  CTAB no wheezing or rhonchi GI/GU:  normal bowel sounds, non-distended, non-tender MSK/SPINE:  No gross deformities, no edema SKIN:  no rash, atraumatic PSYCH:  Appropriate speech and behavior  *Additional and/or pertinent findings  included in MDM below  Diagnostic and Interventional Summary    EKG Interpretation  Date/Time:  Thursday Apr 28 2020 11: 33: 33 EDT Ventricular Rate:  138 PR Interval:    QRS Duration: 109 QT Interval:  322 QTC Calculation: 488 R Axis:   -5 Text Interpretation: Atrial fibrillation with rapid ventricular response.  Confirmed by Dr. Gerlene Fee at 11:41 AM        EKG Interpretation  Date/Time:  Thursday Apr 28 2020 12:48:52 EDT Ventricular Rate:  57 PR Interval:    QRS Duration: 115 QT Interval:  426 QTC Calculation: 415 R Axis:   -9 Text Interpretation: Sinus rhythm Atrial premature complex Probable LVH with secondary repol abnrm Confirmed by Gerlene Fee [phone]) on 04/28/2020 1:30:47 PM      Labs Reviewed  BASIC METABOLIC PANEL - Abnormal; Notable for the following components:      Result Value   Sodium 133 (*)    Potassium 3.3 (*)    Chloride 94 (*)    Glucose, Bld 109 (*)    Creatinine, Ser 1.33 (*)    GFR calc non Af Amer 39 (*)    GFR calc Af Amer 46 (*)    All other components within normal limits  CBC - Abnormal; Notable for the following components:   RBC 5.42 (*)    HCT 47.4 (*)    All other components within normal limits  TROPONIN I (HIGH SENSITIVITY) - Abnormal; Notable for the following components:   Troponin I (High Sensitivity) 55 (*)    All other components within normal limits  TROPONIN I (HIGH SENSITIVITY) - Abnormal; Notable for the following components:   Troponin I (High Sensitivity) 50 (*)    All other components within normal limits    DG Chest Port 1 View  Final Result      Medications  etomidate (AMIDATE) injection 10 mg (has no administration in time range)  diltiazem (CARDIZEM) injection 20 mg (20 mg Intravenous Given 04/28/20 1154)     Procedures  /  Critical Care .Critical Care Performed by: Maudie Flakes, MD Authorized by: Maudie Flakes, MD   Critical care provider statement:    Critical care time (minutes):  37    Critical care was necessary to treat or prevent imminent or life-threatening deterioration of the following conditions: A. fib with RVR.   Critical care was time spent personally by me on the following activities:  Discussions with consultants, evaluation of patient's response to treatment, examination of patient, ordering and performing treatments and interventions, ordering and review of laboratory studies, ordering and review of radiographic studies, pulse oximetry, re-evaluation  of patient's condition, obtaining history from patient or surrogate and review of old charts    ED Course and Medical Decision Making  I have reviewed the triage vital signs, the nursing notes, and pertinent available records from the EMR.  Listed above are laboratory and imaging tests that I personally ordered, reviewed, and interpreted and then considered in my medical decision making (see below for details).      A. fib with RVR, rates in the 130s to 150s, seems to have an abrupt onset yesterday evening.  She thinks may be years ago she had A. fib and she takes metoprolol but she is not on any anticoagulation.  Rate control achieved with 20 mg diltiazem, but patient remains in A. Fib, rates in the 70s.  Good candidate for cardioversion.  Patient is currently without symptoms.  Well-appearing.  The patient was fully consented and staff had fully prepared for cardioversion when it was revealed that patient had converted to sinus rhythm.  Patient was observed for 1 or 2 more hours and she remained in sinus rhythm.  Case discussed with Dr. Domenic Polite of cardiology, who recommends continued metoprolol, does not believe that further AV nodal blockade would benefit the patient as her typical sinus heart rate is in the 50s to 60s.  She is a good candidate for Eliquis given her heart score of 3.  We will follow up in the cardiology clinic.  Barth Kirks. Sedonia Small, [place] [email or web address]  Final Clinical Impressions(s) / ED Diagnoses     ICD-10-CM   1. Atrial fibrillation with RVR (HCC)  I48.91   2. Chest pain  R07.9 DG Chest Port 1 View    DG Chest [place] 1 View    ED Discharge Orders         Ordered    apixaban (ELIQUIS) 5 MG TABS tablet  2 times daily     04/28/20 1443           Discharge Instructions Discussed with and Provided to Patient:     Discharge Instructions     You were evaluated in the Emergency Department and after careful evaluation, we did not find any emergent condition requiring admission or further testing in the hospital.  Your exam/testing today was overall reassuring.  Your symptoms seem to be due to atrial fibrillation with a fast heart rate.  We gave you a medicine called diltiazem which was able to control your heart rate and switch your heart back to a normal rhythm.  We discussed your case with Dr. Domenic Polite of the cardiology team.  He and his team would like to see you within the next few days and they will reach out to you for an appointment.  Until then please continue taking your metoprolol and other daily medications as directed.  We are starting you on a new medication called Eliquis.  This is the blood thinner that we talked about.  It will be important to avoid trauma or falls while on this medication as it can cause you to bleed more easily.  Please return to the Emergency Department if you experience any worsening of your condition.  We encourage you to follow up with a primary care provider.  Thank you for allowing [place] to be a part of your care.        Maudie Flakes, MD 04/28/20 [phone]

## 2020-05-02 NOTE — Progress Notes (Signed)
Cardiology Office Note    Date:  05/03/2020   ID:  Aylee, Littrell 1946/03/08, MRN [id number]  PCP:  Andres Shad, MD  Cardiologist: Rozann Lesches, MD    Chief Complaint  Patient presents with  . Follow-up    recent Emergency Dept visit    History of Present Illness:    [name] [name] is a 74 y.o. female with past medical history of HTN, HLD, lower extremity edema and vasovagal syncope who presents to the office today for Emergency Department follow-up.   She most recently had a telehealth visit with Dr. Domenic Polite in 05/2019 and denied any recent chest pain or palpitations at that time. She had a history of vasovagal syncope but denied any recurrence. Was continued on her current medication regimen at that time including Lasix 20mg  every other day, Toprol-XL 25mg  daily, K-dur 20 mEq TID, Simvastatin 40mg  daily and Triamterene-HCTZ 75-50mg  daily.   She recently presented to [facility] ED on 04/28/2020 for evaluation of dyspnea and chest discomfort which started the night prior to arrival. Reported associated palpitations and she was found to be in atrial fibrillation with RVR. Rates were elevated in the 130's to 150's and given recent symptom onset, a cardioversion was initially going to be pursued in the ED but she converted to normal sinus rhythm after receiving IV Cardizem. Her case was reviewed with Dr. Domenic Polite and it was recommended that she continue on Toprol-XL 25 mg daily (not further titrated given resting HR in the 50's to 60's)  along with starting Eliquis 5 mg twice daily for anticoagulation. K+ was low at 3.3. Follow-up was arranged and it was felt she would benefit from monitoring in the future as she would likely need antiarrhythmic therapy if she developed recurrent arrhythmias.  In talking with the patient today, she reports overall doing well since her recent Emergency Department evaluation. She was very aware of her arrhythmia and denies any recurrent  palpitations resembling the episode. She does report having dyspnea on exertion for the past 2 to 3 months but is unsure if this might be secondary to her asthma. She denies any associated chest pain or palpitations with this. No recent orthopnea, PND or edema.  She has remained on HCTZ along with Lasix 20 mg every other day as she reports having worsening "fluid along her chest" when not taking Lasix in the past. She does consume a high sodium diet.  Past Medical History:  Diagnosis Date  . GERD (gastroesophageal reflux disease)   . Gout   . Hyperlipidemia   . Hypertension   . Hypokalemia   . PAF (paroxysmal atrial fibrillation) ([place])   . PUD (peptic ulcer disease)   . Syncope    Neurocardiogenic    Past Surgical History:  Procedure Laterality Date  . ABDOMINAL HYSTERECTOMY    . CATARACT EXTRACTION Bilateral   . COLONOSCOPY N/A 10/07/2014   Dr. Oneida Alar: redundant left colon, moderate sized external hemorrhoids   . COLONOSCOPY N/A 03/11/2020   Procedure: COLONOSCOPY;  Surgeon: Danie Binder, MD;  Location: [facility];  Service: Endoscopy;  Laterality: N/A;  9:30am w/ overtube  . LIPOMA RESECTION    . POLYPECTOMY  03/11/2020   Procedure: POLYPECTOMY;  Surgeon: Danie Binder, MD;  Location: [facility];  Service: Endoscopy;;  cecal, transverse, descending, sigmoid    Current Medications: Outpatient Medications Prior to Visit  Medication Sig Dispense Refill  . acetaminophen (TYLENOL) 500 MG tablet Take 1,000 mg by mouth  every 6 (six) hours as needed for moderate pain.    Marland Kitchen albuterol (VENTOLIN HFA) 108 (90 Base) MCG/ACT inhaler     . APPLE CIDER VINEGAR PO Take 2 capsules by mouth daily.    . cetirizine (ZYRTEC) 10 MG tablet Take 10 mg by mouth daily as needed for allergies.    Marland Kitchen colchicine 0.6 MG tablet Take 0.6 mg by mouth daily.    . diclofenac Sodium (VOLTAREN) 1 % GEL Apply 1 application topically 4 (four) times daily as needed (pain).    Marland Kitchen ELDERBERRY PO Take 2 capsules  by mouth daily.    . fluticasone (FLONASE) 50 MCG/ACT nasal spray Place 1 spray into both nostrils daily as needed for allergies or rhinitis.    . Fluticasone-Salmeterol (ADVAIR) 500-50 MCG/DOSE AEPB Inhale 1 puff into the lungs every 12 (twelve) hours.    . furosemide (LASIX) 20 MG tablet Take 20 mg by mouth every other day.     . hydrocortisone (PROCTOSOL HC) 2.5 % rectal cream PLACE RECTALLY 2 (TWO) TIMES DAILY. NO MORE THAN 14 DAYS AT A TIME. (Patient taking differently: Place 1 application rectally 2 (two) times daily as needed for hemorrhoids. ) 28.35 g 3  . lubiprostone (AMITIZA) 24 MCG capsule TAKE 1 CAPSULE TWICE DAILY WITH A MEAL (Patient taking differently: Take 24 mcg by mouth in the morning and at bedtime. ) 180 capsule 3  . meclizine (ANTIVERT) 25 MG tablet Take 25 mg by mouth 3 (three) times daily as needed for dizziness.     . metoprolol succinate (TOPROL-XL) 25 MG 24 hr tablet TAKE 1 TABLET EVERY DAY (Patient taking differently: Take 25 mg by mouth daily. ) 90 tablet 3  . omeprazole (PRILOSEC) 40 MG capsule Take 40 mg by mouth daily.      . potassium chloride SA (KLOR-CON) 20 MEQ tablet Take 20 mEq by mouth in the morning, at noon, and at bedtime.    . simvastatin (ZOCOR) 40 MG tablet Take 40 mg by mouth daily in the afternoon.     . triamterene-hydrochlorothiazide (MAXZIDE) 75-50 MG per tablet Take 1 tablet by mouth daily.    . vitamin C (ASCORBIC ACID) 250 MG tablet Take 500 mg by mouth daily.    Marland Kitchen apixaban (ELIQUIS) 5 MG TABS tablet Take 1 tablet (5 mg total) by mouth 2 (two) times daily. 60 tablet 0   No facility-administered medications prior to visit.     Allergies:   Acetaminophen-codeine, Allopurinol, Amoxicillin, and Tramadol   Social History   Socioeconomic History  . Marital status: Single    Spouse name: Not on file  . Number of children: Not on file  . Years of education: Not on file  . Highest education level: Not on file  Occupational History  .  Occupation: Retired  Tobacco Use  . Smoking status: Former Smoker    Packs/day: 1.00    Years: 20.00    Pack years: 20.00    Types: Cigarettes    Start date: 03/16/1964    Quit date: 10/07/1997    Years since quitting: 22.5  . Smokeless tobacco: Never Used  Substance and Sexual Activity  . Alcohol use: No    Alcohol/week: 0.0 standard drinks  . Drug use: No  . Sexual activity: Yes    Partners: Male  Other Topics Concern  . Not on file  Social History Narrative  . Not on file   Social Determinants of Health   Financial Resource Strain:   . Difficulty  of Paying Living Expenses:   Food Insecurity:   . Worried About Charity fundraiser in the Last Year:   . Arboriculturist in the Last Year:   Transportation Needs:   . Film/video editor (Medical):   Marland Kitchen Lack of Transportation (Non-Medical):   Physical Activity:   . Days of Exercise per Week:   . Minutes of Exercise per Session:   Stress:   . Feeling of Stress :   Social Connections:   . Frequency of Communication with Friends and Family:   . Frequency of Social Gatherings with Friends and Family:   . Attends Religious Services:   . Active Member of Clubs or Organizations:   . Attends Archivist Meetings:   Marland Kitchen Marital Status:      Family History:  The patient's family history includes Cancer - Other in her sister; Dementia in her father; Stroke in her mother.   Review of Systems:   Please see the history of present illness.     General:  No chills, fever, night sweats or weight changes.  Cardiovascular:  No chest pain, edema, orthopnea, paroxysmal nocturnal dyspnea. Positive for dyspnea on exertion and palpitations (resolved).  Dermatological: No rash, lesions/masses Respiratory: No cough, dyspnea Urologic: No hematuria, dysuria Abdominal:   No nausea, vomiting, diarrhea, bright red blood per rectum, melena, or hematemesis Neurologic:  No visual changes, wkns, changes in mental status. All other systems  reviewed and are otherwise negative except as noted above.   Physical Exam:    VS:  BP (!) 144/78   Pulse 78   Temp 98.3 F (36.8 C)   Ht 5\' 2"  (1.575 m)   Wt 177 lb (80.3 kg)   SpO2 97%   BMI 32.37 kg/m    General: Well developed, well nourished,female appearing in no acute distress. Head: Normocephalic, atraumatic, sclera non-icteric.  Neck: No carotid bruits. JVD not elevated.  Lungs: Respirations regular and unlabored, without wheezes or rales.  Heart: Regular rate and rhythm. No S3 or S4.  No murmur, no rubs, or gallops appreciated. Abdomen: Soft, non-tender, non-distended. No obvious abdominal masses. Msk:  Strength and tone appear normal for age. No obvious joint deformities or effusions. Extremities: No clubbing or cyanosis. No lower extremity edema.  Distal pedal pulses are 2+ bilaterally. Neuro: Alert and oriented X 3. Moves all extremities spontaneously. No focal deficits noted. Psych:  Responds to questions appropriately with a normal affect. Skin: No rashes or lesions noted  Wt Readings from Last 3 Encounters:  05/03/20 177 lb (80.3 kg)  04/28/20 175 lb (79.4 kg)  11/04/19 166 lb 6.4 oz (75.5 kg)      Studies/Labs Reviewed:   EKG:  EKG is not ordered today. EKG from 04/28/2020 is reviewed and shows sinus bradycardia, HR 57 with PAC's and LVH with repol abnormality along the lateral leads which is similar to prior tracings.   Recent Labs: 05/06/2019: B Natriuretic Peptide 109.0 04/28/2020: BUN 19; Creatinine, Ser 1.33; Hemoglobin 15.0; Platelets 300; Potassium 3.3; Sodium 133   Lipid Panel No results found for: CHOL, TRIG, HDL, CHOLHDL, VLDL, LDLCALC, LDLDIRECT  Additional studies/ records that were reviewed today include:   Stress Echocardiogram: 12/2013 Study Conclusions   - Stress: Functional capacity was excellent, >150% of  predicted based on age and gender.  - Stress ECG conclusions: The stress ECG was equivocal,  baseline ST/T changes became more  prominent with stress.  This may be secondary to cavity obliteration or  hypertensive  response in the setting of underlying LVH.  - Baseline: LV global systolic function was normal. There is  baseline LVH and mitral anular calcification.  - Peak stress: LV global systolic function was normal. There  was hyperdynamic response of all wall segments with cavity  obliteration.  Impressions:   - Normal study after maximal exercise.    Assessment:    1. PAF (paroxysmal atrial fibrillation) ([place])   2. Essential hypertension   3. Leg swelling   4. Hypokalemia      Plan:   In order of problems listed above:  1. Paroxysmal Atrial Fibrillation - This was a new diagnosis for the patient during her recent Emergency Department evaluation and she converted back to normal sinus rhythm with IV Cardizem. She denies any recurrent palpitations since discharge. - Will plan to continue Toprol-XL 25 mg daily for rate-control along with Eliquis 5 mg twice daily for anticoagulation.  - Given her newly diagnosed arrhythmia along with symptoms of dyspnea on exertion, will plan to obtain a repeat echocardiogram to assess for any structural abnormalities. Will also obtain labs to include BMET, TSH and Mg. Will hold off on a monitor for now as she was very symptomatic and aware of her arrhythmia. She was informed to make [place] aware if she has any recurrent symptoms.  2. HTN - BP was initially elevated at 164/74 during today's visit, improved to 144/78 on recheck. I did encourage her to continue to follow this at home. Continue current regimen for now with Toprol-XL 25mg  daily and Triamterene-HCTZ 75-50mg  daily.   3. Lower Extremity Edema - she is currently on Triamterene-HCTZ 75-50mg  daily and Lasix 20mg  every other day. I reviewed with her today that dual diuretic therapy is not ideal but she reports worsening symptoms when Lasix was previously discontinued. Given her mild AKI and also hypokalemia by  recent labs, I did recommend that she only take Lasix every third day as she does not appear volume overloaded by examination. The importance of limiting sodium intake was reviewed.  4. Hypokalemia - Recent labs showed K+ was low at 3.3 and creatinine was mildly elevated at 1.33.  Will plan to obtain a repeat BMET at the time of her echocardiogram.    Medication Adjustments/Labs and Tests Ordered: Current medicines are reviewed at length with the patient today.  Concerns regarding medicines are outlined above.  Medication changes, Labs and Tests ordered today are listed in the Patient Instructions below. Patient Instructions   Medication Instructions:  Your physician recommends that you continue on your current medications as directed. Please refer to the Current Medication list given to you today.  *If you need a refill on your cardiac medications before your next appointment, please call your pharmacy*   Lab Work: Your physician recommends that you return for lab work in: Today If you have labs (blood work) drawn today and your tests are completely normal, you will receive your results only by: Marland Kitchen MyChart Message (if you have MyChart) OR . A paper copy in the mail If you have any lab test that is abnormal or we need to change your treatment, we will call you to review the results.   Testing/Procedures: Your physician has requested that you have an echocardiogram. Echocardiography is a painless test that uses sound waves to create images of your heart. It provides your doctor with information about the size and shape of your heart and how well your heart's chambers and valves are working. This procedure takes approximately one  hour. There are no restrictions for this procedure.     Follow-Up: At [facility], you and your health needs are our priority.  As part of our continuing mission to provide you with exceptional heart care, we have created designated Provider Care Teams.  These  Care Teams include your primary Cardiologist (physician) and Advanced Practice Providers (APPs -  Physician Assistants and Nurse Practitioners) who all work together to provide you with the care you need, when you need it.  We recommend signing up for the patient portal called "MyChart".  Sign up information is provided on this After Visit Summary.  MyChart is used to connect with patients for Virtual Visits (Telemedicine).  Patients are able to view lab/test results, encounter notes, upcoming appointments, etc.  Non-urgent messages can be sent to your provider as well.   To learn more about what you can do with MyChart, go to [email or web address].    Your next appointment:   3 month(s)  The format for your next appointment:   In Person  Provider:   Rozann Lesches, MD or Bernerd Pho, PA-C   Other Instructions Thank you for choosing [facility]!    Two Gram Sodium Diet 2000 mg  What is Sodium? Sodium is a mineral found naturally in many foods. The most significant source of sodium in the diet is table salt, which is about 40% sodium.  Processed, convenience, and preserved foods also contain a large amount of sodium.  The body needs only 500 mg of sodium daily to function,  A normal diet provides more than enough sodium even if you do not use salt.  Why Limit Sodium? A build up of sodium in the body can cause thirst, increased blood pressure, shortness of breath, and water retention.  Decreasing sodium in the diet can reduce edema and risk of heart attack or stroke associated with high blood pressure.  Keep in mind that there are many other factors involved in these health problems.  Heredity, obesity, lack of exercise, cigarette smoking, stress and what you eat all play a role.  General Guidelines:  Do not add salt at the table or in cooking.  One teaspoon of salt contains over 2 grams of sodium.  Read food labels  Avoid processed and convenience foods  Ask your  dietitian before eating any foods not dicussed in the menu planning guidelines  Consult your physician if you wish to use a salt substitute or a sodium containing medication such as antacids.  Limit milk and milk products to 16 oz (2 cups) per day.  Shopping Hints:  READ LABELS!! "Dietetic" does not necessarily mean low sodium.  Salt and other sodium ingredients are often added to foods during processing.   Menu Planning Guidelines Food Group Choose More Often Avoid  Beverages (see also the milk group All fruit juices, low-sodium, salt-free vegetables juices, low-sodium carbonated beverages Regular vegetable or tomato juices, commercially softened water used for drinking or cooking  Breads and Cereals Enriched white, wheat, rye and pumpernickel bread, hard rolls and dinner rolls; muffins, cornbread and waffles; most dry cereals, cooked cereal without added salt; unsalted crackers and breadsticks; low sodium or homemade bread crumbs Bread, rolls and crackers with salted tops; quick breads; instant hot cereals; pancakes; commercial bread stuffing; self-rising flower and biscuit mixes; regular bread crumbs or cracker crumbs  Desserts and Sweets Desserts and sweets mad with mild should be within allowance Instant pudding mixes and cake mixes  Fats Butter or margarine; vegetable oils; unsalted  salad dressings, regular salad dressings limited to 1 Tbs; light, sour and heavy cream Regular salad dressings containing bacon fat, bacon bits, and salt pork; snack dips made with instant soup mixes or processed cheese; salted nuts  Fruits Most fresh, frozen and canned fruits Fruits processed with salt or sodium-containing ingredient (some dried fruits are processed with sodium sulfites        Vegetables Fresh, frozen vegetables and low- sodium canned vegetables Regular canned vegetables, sauerkraut, pickled vegetables, and others prepared in brine; frozen vegetables in sauces; vegetables seasoned with ham,  bacon or salt pork  Condiments, Sauces, Miscellaneous  Salt substitute with physician's approval; pepper, herbs, spices; vinegar, lemon or lime juice; hot pepper sauce; garlic powder, onion powder, low sodium soy sauce (1 Tbs.); low sodium condiments (ketchup, chili sauce, mustard) in limited amounts (1 tsp.) fresh ground horseradish; unsalted tortilla chips, pretzels, potato chips, popcorn, salsa (1/4 cup) Any seasoning made with salt including garlic salt, celery salt, onion salt, and seasoned salt; sea salt, rock salt, kosher salt; meat tenderizers; monosodium glutamate; mustard, regular soy sauce, barbecue, sauce, chili sauce, teriyaki sauce, steak sauce, Worcestershire sauce, and most flavored vinegars; canned gravy and mixes; regular condiments; salted snack foods, olives, picles, relish, horseradish sauce, catsup   Food preparation: Try these seasonings Meats:    Pork Sage, onion Serve with applesauce  Chicken Poultry seasoning, thyme, parsley Serve with cranberry sauce  Lamb Curry powder, rosemary, garlic, thyme Serve with mint sauce or jelly  Veal Marjoram, basil Serve with current jelly, cranberry sauce  Beef Pepper, bay leaf Serve with dry mustard, unsalted chive butter  Fish [place] leaf, dill Serve with unsalted lemon butter, unsalted parsley butter  Vegetables:    Asparagus Lemon juice   Broccoli Lemon juice   Carrots Mustard dressing parsley, mint, nutmeg, glazed with unsalted butter and sugar   Green beans Marjoram, lemon juice, nutmeg,dill seed   Tomatoes Basil, marjoram, onion   Spice /blend for [employer]" 4 tsp ground thyme 1 tsp ground sage 3 tsp ground rosemary 4 tsp ground marjoram   Test your knowledge 1. A product that says "Salt Free" may still contain sodium. True or False 2. Garlic Powder and Hot Pepper Sauce an be used as alternative seasonings.True or False 3. Processed foods have more sodium than fresh foods.  True or False 4. Canned Vegetables have less sodium  than froze True or False  WAYS TO DECREASE YOUR SODIUM INTAKE 1. Avoid the use of added salt in cooking and at the table.  Table salt (and other prepared seasonings which contain salt) is probably one of the greatest sources of sodium in the diet.  Unsalted foods can gain flavor from the sweet, sour, and butter taste sensations of herbs and spices.  Instead of using salt for seasoning, try the following seasonings with the foods listed.  Remember: how you use them to enhance natural food flavors is limited only by your creativity... Allspice-Meat, fish, eggs, fruit, peas, red and yellow vegetables Almond Extract-Fruit baked goods Anise Seed-Sweet breads, fruit, carrots, beets, cottage cheese, cookies (tastes like licorice) Basil-Meat, fish, eggs, vegetables, rice, vegetables salads, soups, sauces Bay Leaf-Meat, fish, stews, poultry Burnet-Salad, vegetables (cucumber-like flavor) Caraway Seed-Bread, cookies, cottage cheese, meat, vegetables, cheese, rice Cardamon-Baked goods, fruit, soups Celery Powder or seed-Salads, salad dressings, sauces, meatloaf, soup, bread.Do not use  celery salt Chervil-Meats, salads, fish, eggs, vegetables, cottage cheese (parsley-like flavor) Chili Power-Meatloaf, chicken cheese, corn, eggplant, egg dishes Chives-Salads cottage cheese, egg dishes, soups, vegetables,  sauces Cilantro-Salsa, casseroles Cinnamon-Baked goods, fruit, pork, lamb, chicken, carrots Cloves-Fruit, baked goods, fish, pot roast, green beans, beets, carrots Coriander-Pastry, cookies, meat, salads, cheese (lemon-orange flavor) Cumin-Meatloaf, fish,cheese, eggs, cabbage,fruit pie (caraway flavor) [employer], fruit, eggs, fish, poultry, cottage cheese, vegetables Dill Seed-Meat, cottage cheese, poultry, vegetables, fish, salads, bread Fennel Seed-Bread, cookies, apples, pork, eggs, fish, beets, cabbage, cheese, Licorice-like flavor Garlic-(buds or powder) Salads, meat, poultry, fish, bread,  butter, vegetables, potatoes.Do not  use garlic salt Ginger-Fruit, vegetables, baked goods, meat, fish, poultry Horseradish Root-Meet, vegetables, butter Lemon Juice or Extract-Vegetables, fruit, tea, baked goods, fish salads Mace-Baked goods fruit, vegetables, fish, poultry (taste like nutmeg) Maple Extract-Syrups Marjoram-Meat, chicken, fish, vegetables, breads, green salads (taste like Sage) Mint-Tea, lamb, sherbet, vegetables, desserts, carrots, cabbage Mustard, Dry or Seed-Cheese, eggs, meats, vegetables, poultry Nutmeg-Baked goods, fruit, chicken, eggs, vegetables, desserts Onion Powder-Meat, fish, poultry, vegetables, cheese, eggs, bread, rice salads (Do not use   Onion salt) Orange Extract-Desserts, baked goods Oregano-Pasta, eggs, cheese, onions, pork, lamb, fish, chicken, vegetables, green salads Paprika-Meat, fish, poultry, eggs, cheese, vegetables Parsley Flakes-Butter, vegetables, meat fish, poultry, eggs, bread, salads (certain forms may   Contain sodium Pepper-Meat fish, poultry, vegetables, eggs Peppermint Extract-Desserts, baked goods Poppy Seed-Eggs, bread, cheese, fruit dressings, baked goods, noodles, vegetables, cottage  [employer], poultry, meat, fish, cauliflower, turnips,eggs bread Saffron-Rice, bread, veal, chicken, fish, eggs Sage-Meat, fish, poultry, onions, eggplant, tomateos, pork, stews Savory-Eggs, salads, poultry, meat, rice, vegetables, soups, pork Tarragon-Meat, poultry, fish, eggs, butter, vegetables (licorice-like flavor)  Thyme-Meat, poultry, fish, eggs, vegetables, (clover-like flavor), sauces, soups Tumeric-Salads, butter, eggs, fish, rice, vegetables (saffron-like flavor) Vanilla Extract-Baked goods, candy Vinegar-Salads, vegetables, meat marinades Walnut Extract-baked goods, candy     Signed, Erma Heritage, [place]  05/03/2020 6:04 PM    [facility] [address]  [place], [place] [address] Phone: [phone] Fax: [phone]

## 2020-05-03 ENCOUNTER — Encounter: Payer: Self-pay | Admitting: Student

## 2020-05-03 ENCOUNTER — Other Ambulatory Visit: Payer: Self-pay

## 2020-05-03 ENCOUNTER — Ambulatory Visit: Payer: Medicare HMO | Admitting: Student

## 2020-05-03 VITALS — BP 144/78 | HR 78 | Temp 98.3°F | Ht 62.0 in | Wt 177.0 lb

## 2020-05-03 DIAGNOSIS — E876 Hypokalemia: Secondary | ICD-10-CM

## 2020-05-03 DIAGNOSIS — M7989 Other specified soft tissue disorders: Secondary | ICD-10-CM | POA: Diagnosis not present

## 2020-05-03 DIAGNOSIS — I480 Paroxysmal atrial fibrillation: Secondary | ICD-10-CM | POA: Diagnosis not present

## 2020-05-03 DIAGNOSIS — I10 Essential (primary) hypertension: Secondary | ICD-10-CM

## 2020-05-03 MED ORDER — APIXABAN 5 MG PO TABS: 5.0000 mg | ORAL_TABLET | Freq: Two times a day (BID) | ORAL | 11 refills | Status: DC

## 2020-05-03 NOTE — Patient Instructions (Addendum)
Medication Instructions:  Your physician recommends that you continue on your current medications as directed. Please refer to the Current Medication list given to you today.  *If you need a refill on your cardiac medications before your next appointment, please call your pharmacy*   Lab Work: Your physician recommends that you return for lab work in: Today If you have labs (blood work) drawn today and your tests are completely normal, you will receive your results only by: Marland Kitchen MyChart Message (if you have MyChart) OR . A paper copy in the mail If you have any lab test that is abnormal or we need to change your treatment, we will call you to review the results.   Testing/Procedures: Your physician has requested that you have an echocardiogram. Echocardiography is a painless test that uses sound waves to create images of your heart. It provides your doctor with information about the size and shape of your heart and how well your heart's chambers and valves are working. This procedure takes approximately one hour. There are no restrictions for this procedure.     Follow-Up: At [facility], you and your health needs are our priority.  As part of our continuing mission to provide you with exceptional heart care, we have created designated Provider Care Teams.  These Care Teams include your primary Cardiologist (physician) and Advanced Practice Providers (APPs -  Physician Assistants and Nurse Practitioners) who all work together to provide you with the care you need, when you need it.  We recommend signing up for the patient portal called "MyChart".  Sign up information is provided on this After Visit Summary.  MyChart is used to connect with patients for Virtual Visits (Telemedicine).  Patients are able to view lab/test results, encounter notes, upcoming appointments, etc.  Non-urgent messages can be sent to your provider as well.   To learn more about what you can do with MyChart, go to  [email or web address].    Your next appointment:   3 month(s)  The format for your next appointment:   In Person  Provider:   Rozann Lesches, MD or Bernerd Pho, PA-C   Other Instructions Thank you for choosing [facility]!    Two Gram Sodium Diet 2000 mg  What is Sodium? Sodium is a mineral found naturally in many foods. The most significant source of sodium in the diet is table salt, which is about 40% sodium.  Processed, convenience, and preserved foods also contain a large amount of sodium.  The body needs only 500 mg of sodium daily to function,  A normal diet provides more than enough sodium even if you do not use salt.  Why Limit Sodium? A build up of sodium in the body can cause thirst, increased blood pressure, shortness of breath, and water retention.  Decreasing sodium in the diet can reduce edema and risk of heart attack or stroke associated with high blood pressure.  Keep in mind that there are many other factors involved in these health problems.  Heredity, obesity, lack of exercise, cigarette smoking, stress and what you eat all play a role.  General Guidelines:  Do not add salt at the table or in cooking.  One teaspoon of salt contains over 2 grams of sodium.  Read food labels  Avoid processed and convenience foods  Ask your dietitian before eating any foods not dicussed in the menu planning guidelines  Consult your physician if you wish to use a salt substitute or a sodium containing medication such as  antacids.  Limit milk and milk products to 16 oz (2 cups) per day.  Shopping Hints:  READ LABELS!! "Dietetic" does not necessarily mean low sodium.  Salt and other sodium ingredients are often added to foods during processing.   Menu Planning Guidelines Food Group Choose More Often Avoid  Beverages (see also the milk group All fruit juices, low-sodium, salt-free vegetables juices, low-sodium carbonated beverages Regular vegetable or tomato  juices, commercially softened water used for drinking or cooking  Breads and Cereals Enriched white, wheat, rye and pumpernickel bread, hard rolls and dinner rolls; muffins, cornbread and waffles; most dry cereals, cooked cereal without added salt; unsalted crackers and breadsticks; low sodium or homemade bread crumbs Bread, rolls and crackers with salted tops; quick breads; instant hot cereals; pancakes; commercial bread stuffing; self-rising flower and biscuit mixes; regular bread crumbs or cracker crumbs  Desserts and Sweets Desserts and sweets mad with mild should be within allowance Instant pudding mixes and cake mixes  Fats Butter or margarine; vegetable oils; unsalted salad dressings, regular salad dressings limited to 1 Tbs; light, sour and heavy cream Regular salad dressings containing bacon fat, bacon bits, and salt pork; snack dips made with instant soup mixes or processed cheese; salted nuts  Fruits Most fresh, frozen and canned fruits Fruits processed with salt or sodium-containing ingredient (some dried fruits are processed with sodium sulfites        Vegetables Fresh, frozen vegetables and low- sodium canned vegetables Regular canned vegetables, sauerkraut, pickled vegetables, and others prepared in brine; frozen vegetables in sauces; vegetables seasoned with ham, bacon or salt pork  Condiments, Sauces, Miscellaneous  Salt substitute with physician's approval; pepper, herbs, spices; vinegar, lemon or lime juice; hot pepper sauce; garlic powder, onion powder, low sodium soy sauce (1 Tbs.); low sodium condiments (ketchup, chili sauce, mustard) in limited amounts (1 tsp.) fresh ground horseradish; unsalted tortilla chips, pretzels, potato chips, popcorn, salsa (1/4 cup) Any seasoning made with salt including garlic salt, celery salt, onion salt, and seasoned salt; sea salt, rock salt, kosher salt; meat tenderizers; monosodium glutamate; mustard, regular soy sauce, barbecue, sauce, chili  sauce, teriyaki sauce, steak sauce, Worcestershire sauce, and most flavored vinegars; canned gravy and mixes; regular condiments; salted snack foods, olives, picles, relish, horseradish sauce, catsup   Food preparation: Try these seasonings Meats:    Pork Sage, onion Serve with applesauce  Chicken Poultry seasoning, thyme, parsley Serve with cranberry sauce  Lamb Curry powder, rosemary, garlic, thyme Serve with mint sauce or jelly  Veal Marjoram, basil Serve with current jelly, cranberry sauce  Beef Pepper, bay leaf Serve with dry mustard, unsalted chive butter  Fish [place] leaf, dill Serve with unsalted lemon butter, unsalted parsley butter  Vegetables:    Asparagus Lemon juice   Broccoli Lemon juice   Carrots Mustard dressing parsley, mint, nutmeg, glazed with unsalted butter and sugar   Green beans Marjoram, lemon juice, nutmeg,dill seed   Tomatoes Basil, marjoram, onion   Spice /blend for [employer]" 4 tsp ground thyme 1 tsp ground sage 3 tsp ground rosemary 4 tsp ground marjoram   Test your knowledge 1. A product that says "Salt Free" may still contain sodium. True or False 2. Garlic Powder and Hot Pepper Sauce an be used as alternative seasonings.True or False 3. Processed foods have more sodium than fresh foods.  True or False 4. Canned Vegetables have less sodium than froze True or False  WAYS TO DECREASE YOUR SODIUM INTAKE 1. Avoid the use of added  salt in cooking and at the table.  Table salt (and other prepared seasonings which contain salt) is probably one of the greatest sources of sodium in the diet.  Unsalted foods can gain flavor from the sweet, sour, and butter taste sensations of herbs and spices.  Instead of using salt for seasoning, try the following seasonings with the foods listed.  Remember: how you use them to enhance natural food flavors is limited only by your creativity... Allspice-Meat, fish, eggs, fruit, peas, red and yellow vegetables Almond Extract-Fruit  baked goods Anise Seed-Sweet breads, fruit, carrots, beets, cottage cheese, cookies (tastes like licorice) Basil-Meat, fish, eggs, vegetables, rice, vegetables salads, soups, sauces Bay Leaf-Meat, fish, stews, poultry Burnet-Salad, vegetables (cucumber-like flavor) Caraway Seed-Bread, cookies, cottage cheese, meat, vegetables, cheese, rice Cardamon-Baked goods, fruit, soups Celery Powder or seed-Salads, salad dressings, sauces, meatloaf, soup, bread.Do not use  celery salt Chervil-Meats, salads, fish, eggs, vegetables, cottage cheese (parsley-like flavor) Chili Power-Meatloaf, chicken cheese, corn, eggplant, egg dishes Chives-Salads cottage cheese, egg dishes, soups, vegetables, sauces Cilantro-Salsa, casseroles Cinnamon-Baked goods, fruit, pork, lamb, chicken, carrots Cloves-Fruit, baked goods, fish, pot roast, green beans, beets, carrots Coriander-Pastry, cookies, meat, salads, cheese (lemon-orange flavor) Cumin-Meatloaf, fish,cheese, eggs, cabbage,fruit pie (caraway flavor) [employer], fruit, eggs, fish, poultry, cottage cheese, vegetables Dill Seed-Meat, cottage cheese, poultry, vegetables, fish, salads, bread Fennel Seed-Bread, cookies, apples, pork, eggs, fish, beets, cabbage, cheese, Licorice-like flavor Garlic-(buds or powder) Salads, meat, poultry, fish, bread, butter, vegetables, potatoes.Do not  use garlic salt Ginger-Fruit, vegetables, baked goods, meat, fish, poultry Horseradish Root-Meet, vegetables, butter Lemon Juice or Extract-Vegetables, fruit, tea, baked goods, fish salads Mace-Baked goods fruit, vegetables, fish, poultry (taste like nutmeg) Maple Extract-Syrups Marjoram-Meat, chicken, fish, vegetables, breads, green salads (taste like Sage) Mint-Tea, lamb, sherbet, vegetables, desserts, carrots, cabbage Mustard, Dry or Seed-Cheese, eggs, meats, vegetables, poultry Nutmeg-Baked goods, fruit, chicken, eggs, vegetables, desserts Onion Powder-Meat, fish, poultry,  vegetables, cheese, eggs, bread, rice salads (Do not use   Onion salt) Orange Extract-Desserts, baked goods Oregano-Pasta, eggs, cheese, onions, pork, lamb, fish, chicken, vegetables, green salads Paprika-Meat, fish, poultry, eggs, cheese, vegetables Parsley Flakes-Butter, vegetables, meat fish, poultry, eggs, bread, salads (certain forms may   Contain sodium Pepper-Meat fish, poultry, vegetables, eggs Peppermint Extract-Desserts, baked goods Poppy Seed-Eggs, bread, cheese, fruit dressings, baked goods, noodles, vegetables, cottage  [employer], poultry, meat, fish, cauliflower, turnips,eggs bread Saffron-Rice, bread, veal, chicken, fish, eggs Sage-Meat, fish, poultry, onions, eggplant, tomateos, pork, stews Savory-Eggs, salads, poultry, meat, rice, vegetables, soups, pork Tarragon-Meat, poultry, fish, eggs, butter, vegetables (licorice-like flavor)  Thyme-Meat, poultry, fish, eggs, vegetables, (clover-like flavor), sauces, soups Tumeric-Salads, butter, eggs, fish, rice, vegetables (saffron-like flavor) Vanilla Extract-Baked goods, candy Vinegar-Salads, vegetables, meat marinades Walnut Extract-baked goods, candy

## 2020-05-04 ENCOUNTER — Other Ambulatory Visit: Payer: Self-pay

## 2020-05-04 ENCOUNTER — Emergency Department (HOSPITAL_COMMUNITY): Payer: Medicare HMO

## 2020-05-04 ENCOUNTER — Observation Stay (HOSPITAL_COMMUNITY)
Admission: EM | Admit: 2020-05-04 | Discharge: 2020-05-05 | Disposition: A | Discharge status: Home / Self Care | Payer: Medicare HMO | Attending: Family Medicine | Admitting: Family Medicine

## 2020-05-04 ENCOUNTER — Encounter (HOSPITAL_COMMUNITY): Payer: Self-pay

## 2020-05-04 DIAGNOSIS — Z79899 Other long term (current) drug therapy: Secondary | ICD-10-CM | POA: Diagnosis not present

## 2020-05-04 DIAGNOSIS — Z87891 Personal history of nicotine dependence: Secondary | ICD-10-CM | POA: Insufficient documentation

## 2020-05-04 DIAGNOSIS — I10 Essential (primary) hypertension: Secondary | ICD-10-CM | POA: Diagnosis present

## 2020-05-04 DIAGNOSIS — K5900 Constipation, unspecified: Secondary | ICD-10-CM | POA: Diagnosis present

## 2020-05-04 DIAGNOSIS — K219 Gastro-esophageal reflux disease without esophagitis: Secondary | ICD-10-CM | POA: Diagnosis present

## 2020-05-04 DIAGNOSIS — E785 Hyperlipidemia, unspecified: Secondary | ICD-10-CM | POA: Diagnosis present

## 2020-05-04 DIAGNOSIS — E876 Hypokalemia: Secondary | ICD-10-CM | POA: Diagnosis not present

## 2020-05-04 DIAGNOSIS — M109 Gout, unspecified: Secondary | ICD-10-CM | POA: Diagnosis present

## 2020-05-04 DIAGNOSIS — Z20822 Contact with and (suspected) exposure to covid-19: Secondary | ICD-10-CM | POA: Diagnosis not present

## 2020-05-04 DIAGNOSIS — R079 Chest pain, unspecified: Secondary | ICD-10-CM | POA: Diagnosis present

## 2020-05-04 DIAGNOSIS — Z7901 Long term (current) use of anticoagulants: Secondary | ICD-10-CM | POA: Diagnosis not present

## 2020-05-04 DIAGNOSIS — I480 Paroxysmal atrial fibrillation: Secondary | ICD-10-CM | POA: Diagnosis not present

## 2020-05-04 DIAGNOSIS — I4891 Unspecified atrial fibrillation: Secondary | ICD-10-CM | POA: Diagnosis present

## 2020-05-04 DIAGNOSIS — Z7951 Long term (current) use of inhaled steroids: Secondary | ICD-10-CM | POA: Diagnosis not present

## 2020-05-04 LAB — COMPREHENSIVE METABOLIC PANEL
ALT: 14 U/L (ref 0–44)
AST: 22 U/L (ref 15–41)
Albumin: 4.5 g/dL (ref 3.5–5.0)
Alkaline Phosphatase: 80 U/L (ref 38–126)
Anion gap: 10 (ref 5–15)
BUN: 18 mg/dL (ref 8–23)
CO2: 28 mmol/L (ref 22–32)
Calcium: 9.4 mg/dL (ref 8.9–10.3)
Chloride: 98 mmol/L (ref 98–111)
Creatinine, Ser: 1.29 mg/dL — ABNORMAL HIGH (ref 0.44–1.00)
GFR calc Af Amer: 47 mL/min — ABNORMAL LOW (ref >60)
GFR calc non Af Amer: 41 mL/min — ABNORMAL LOW (ref >60)
Glucose, Bld: 119 mg/dL — ABNORMAL HIGH (ref 70–99)
Potassium: 3.2 mmol/L — ABNORMAL LOW (ref 3.5–5.1)
Sodium: 136 mmol/L (ref 135–145)
Total Bilirubin: 0.5 mg/dL (ref 0.3–1.2)
Total Protein: 8 g/dL (ref 6.5–8.1)

## 2020-05-04 LAB — CBC WITH DIFFERENTIAL/PLATELET
Abs Immature Granulocytes: 0.01 10*3/uL (ref 0.00–0.07)
Basophils Absolute: 0.1 10*3/uL (ref 0.0–0.1)
Basophils Relative: 1 %
Eosinophils Absolute: 0.3 10*3/uL (ref 0.0–0.5)
Eosinophils Relative: 3 %
HCT: 46.9 % — ABNORMAL HIGH (ref 36.0–46.0)
Hemoglobin: 15 g/dL (ref 12.0–15.0)
Immature Granulocytes: 0 %
Lymphocytes Relative: 41 %
Lymphs Abs: 3.2 10*3/uL (ref 0.7–4.0)
MCH: 27.7 pg (ref 26.0–34.0)
MCHC: 32 g/dL (ref 30.0–36.0)
MCV: 86.5 fL (ref 80.0–100.0)
Monocytes Absolute: 0.6 10*3/uL (ref 0.1–1.0)
Monocytes Relative: 7 %
Neutro Abs: 3.8 10*3/uL (ref 1.7–7.7)
Neutrophils Relative %: 48 %
Platelets: 311 10*3/uL (ref 150–400)
RBC: 5.42 MIL/uL — ABNORMAL HIGH (ref 3.87–5.11)
RDW: 13.6 % (ref 11.5–15.5)
WBC: 7.9 10*3/uL (ref 4.0–10.5)
nRBC: 0 % (ref 0.0–0.2)

## 2020-05-04 LAB — MAGNESIUM: Magnesium: 2.1 mg/dL (ref 1.7–2.4)

## 2020-05-04 LAB — TROPONIN I (HIGH SENSITIVITY): Troponin I (High Sensitivity): 28 ng/L — ABNORMAL HIGH (ref <18)

## 2020-05-04 MED ORDER — MAGNESIUM SULFATE 2 GM/50ML IV SOLN
2.0000 g | Freq: Once | INTRAVENOUS | Status: AC
  Administered 2020-05-04: 2 g via INTRAVENOUS
  Filled 2020-05-04: qty 50

## 2020-05-04 MED ORDER — DILTIAZEM HCL 25 MG/5ML IV SOLN
10.0000 mg | Freq: Once | INTRAVENOUS | Status: AC
  Administered 2020-05-04: 10 mg via INTRAVENOUS

## 2020-05-04 MED ORDER — DILTIAZEM HCL-DEXTROSE 125-5 MG/125ML-% IV SOLN (PREMIX)
5.0000 mg/h | INTRAVENOUS | Status: DC
  Administered 2020-05-04: 5 mg/h via INTRAVENOUS
  Filled 2020-05-04: qty 125

## 2020-05-04 MED ORDER — POTASSIUM CHLORIDE CRYS ER 20 MEQ PO TBCR
40.0000 meq | EXTENDED_RELEASE_TABLET | Freq: Once | ORAL | Status: AC
  Administered 2020-05-05: 40 meq via ORAL
  Filled 2020-05-04: qty 2

## 2020-05-04 NOTE — ED Triage Notes (Signed)
Pt presents to ED with complaints of chest pain started a couple of hours ago with SOB. Pt states she was here on 04/28/20 diagnosed with A. Fib. Seen Cardiology yesterday.

## 2020-05-04 NOTE — ED Provider Notes (Signed)
[facility] EMERGENCY DEPARTMENT Provider Note   CSN: [id number] Arrival date & time: 05/04/20  2101     History Chief Complaint  Patient presents with  . Chest Pain    [name] [name] is a 74 y.o. female.  Patient states that she started with palpitations and chest tightness today.  Some shortness of breath  The history is provided by the patient. No language interpreter was used.  Chest Pain Pain location:  L chest Pain quality: aching   Pain radiates to:  Does not radiate Pain severity:  Mild Onset quality:  Sudden Timing:  Constant Progression:  Waxing and waning Chronicity:  Recurrent Context: not breathing   Relieved by:  Nothing Associated symptoms: no abdominal pain, no back pain, no cough, no fatigue and no headache        Past Medical History:  Diagnosis Date  . GERD (gastroesophageal reflux disease)   . Gout   . Hyperlipidemia   . Hypertension   . Hypokalemia   . PAF (paroxysmal atrial fibrillation) ([place])   . PUD (peptic ulcer disease)   . Syncope    Neurocardiogenic    Patient Active Problem List   Diagnosis Date Noted  . Encounter for screening colonoscopy   . Hemorrhoid 12/16/2018  . GERD (gastroesophageal reflux disease) 01/29/2018  . Constipation 07/17/2017  . Rectal bleeding 01/30/2017  . Chest pain 12/09/2013  . Renal insufficiency 05/01/2012  . Essential hypertension, benign 05/03/2010  . VASOVAGAL SYNCOPE 05/03/2010    Past Surgical History:  Procedure Laterality Date  . ABDOMINAL HYSTERECTOMY    . CATARACT EXTRACTION Bilateral   . COLONOSCOPY N/A 10/07/2014   Dr. Oneida Alar: redundant left colon, moderate sized external hemorrhoids   . COLONOSCOPY N/A 03/11/2020   Procedure: COLONOSCOPY;  Surgeon: Danie Binder, MD;  Location: [facility] ENDO SUITE;  Service: Endoscopy;  Laterality: N/A;  9:30am w/ overtube  . LIPOMA RESECTION    . POLYPECTOMY  03/11/2020   Procedure: POLYPECTOMY;  Surgeon: Danie Binder, MD;  Location: [facility] ENDO SUITE;   Service: Endoscopy;;  cecal, transverse, descending, sigmoid     OB History   No obstetric history on file.     Family History  Problem Relation Age of Onset  . Stroke Mother   . Dementia Father   . Cancer - Other Sister     Social History   Tobacco Use  . Smoking status: Former Smoker    Packs/day: 1.00    Years: 20.00    Pack years: 20.00    Types: Cigarettes    Start date: 03/16/1964    Quit date: 10/07/1997    Years since quitting: 22.5  . Smokeless tobacco: Never Used  Substance Use Topics  . Alcohol use: No    Alcohol/week: 0.0 standard drinks  . Drug use: No    Home Medications Prior to Admission medications   Medication Sig Start Date End Date Taking? Authorizing Provider  acetaminophen (TYLENOL) 500 MG tablet Take 1,000 mg by mouth every 6 (six) hours as needed for moderate pain.    [provider]  albuterol (VENTOLIN HFA) 108 (90 Base) MCG/ACT inhaler  04/28/20   [provider]  apixaban (ELIQUIS) 5 MG TABS tablet Take 1 tablet (5 mg total) by mouth 2 (two) times daily. 05/03/20 04/28/21  Strader, Fransisco Hertz, PA-C  APPLE CIDER VINEGAR PO Take 2 capsules by mouth daily.    [provider]  cetirizine (ZYRTEC) 10 MG tablet Take 10 mg by mouth daily as  needed for allergies.    [provider]  colchicine 0.6 MG tablet Take 0.6 mg by mouth daily. 01/31/20   [provider]  diclofenac Sodium (VOLTAREN) 1 % GEL Apply 1 application topically 4 (four) times daily as needed (pain).    [provider]  ELDERBERRY PO Take 2 capsules by mouth daily.    [provider]  fluticasone (FLONASE) 50 MCG/ACT nasal spray Place 1 spray into both nostrils daily as needed for allergies or rhinitis.    [provider]  Fluticasone-Salmeterol (ADVAIR) 500-50 MCG/DOSE AEPB Inhale 1 puff into the lungs every 12 (twelve) hours.    [provider]  furosemide (LASIX) 20 MG tablet Take 20 mg by mouth every other  day.     [provider]  hydrocortisone (PROCTOSOL HC) 2.5 % rectal cream PLACE RECTALLY 2 (TWO) TIMES DAILY. NO MORE THAN 14 DAYS AT A TIME. Patient taking differently: Place 1 application rectally 2 (two) times daily as needed for hemorrhoids.  12/16/18   Mahala Menghini, PA-C  lubiprostone (AMITIZA) 24 MCG capsule TAKE 1 CAPSULE TWICE DAILY WITH A MEAL Patient taking differently: Take 24 mcg by mouth in the morning and at bedtime.  08/11/19   Mahala Menghini, PA-C  meclizine (ANTIVERT) 25 MG tablet Take 25 mg by mouth 3 (three) times daily as needed for dizziness.     [provider]  metoprolol succinate (TOPROL-XL) 25 MG 24 hr tablet TAKE 1 TABLET EVERY DAY Patient taking differently: Take 25 mg by mouth daily.  01/08/17   Satira Sark, MD  omeprazole (PRILOSEC) 40 MG capsule Take 40 mg by mouth daily.      [provider]  potassium chloride SA (KLOR-CON) 20 MEQ tablet Take 20 mEq by mouth in the morning, at noon, and at bedtime.    [provider]  simvastatin (ZOCOR) 40 MG tablet Take 40 mg by mouth daily in the afternoon.     [provider]  triamterene-hydrochlorothiazide (MAXZIDE) 75-50 MG per tablet Take 1 tablet by mouth daily.    [provider]  vitamin C (ASCORBIC ACID) 250 MG tablet Take 500 mg by mouth daily.    [provider]    Allergies    Acetaminophen-codeine, Allopurinol, Amoxicillin, and Tramadol  Review of Systems   Review of Systems  Constitutional: Negative for appetite change and fatigue.  HENT: Negative for congestion, ear discharge and sinus pressure.   Eyes: Negative for discharge.  Respiratory: Negative for cough.   Cardiovascular: Positive for chest pain.       Palpitations  Gastrointestinal: Negative for abdominal pain and diarrhea.  Genitourinary: Negative for frequency and hematuria.  Musculoskeletal: Negative for back pain.  Skin: Negative for rash.  Neurological: Negative for  seizures and headaches.  Psychiatric/Behavioral: Negative for hallucinations.    Physical Exam Updated Vital Signs BP 132/84   Pulse 81   Temp 98 F (36.7 C) (Oral)   Resp (!) 24   Ht 5\' 2"  (1.575 m)   Wt 79.4 kg   SpO2 98%   BMI 32.01 kg/m   Physical Exam Vitals and nursing note reviewed.  Constitutional:      Appearance: She is well-developed.  HENT:     Head: Normocephalic.     Nose: Nose normal.  Eyes:     General: No scleral icterus.    Conjunctiva/sclera: Conjunctivae normal.  Neck:     Thyroid: No thyromegaly.  Cardiovascular:     Heart sounds:  No murmur. No friction rub. No gallop.      Comments: Rapid irregular rate Pulmonary:     Breath sounds: No stridor. No wheezing or rales.  Chest:     Chest wall: No tenderness.  Abdominal:     General: There is no distension.     Tenderness: There is no abdominal tenderness. There is no rebound.  Musculoskeletal:        General: Normal range of motion.     Cervical back: Neck supple.  Lymphadenopathy:     Cervical: No cervical adenopathy.  Skin:    Findings: No erythema or rash.  Neurological:     Mental Status: She is oriented to person, place, and time.     Motor: No abnormal muscle tone.     Coordination: Coordination normal.  Psychiatric:        Behavior: Behavior normal.     ED Results / Procedures / Treatments   Labs (all labs ordered are listed, but only abnormal results are displayed) Labs Reviewed  CBC WITH DIFFERENTIAL/PLATELET - Abnormal; Notable for the following components:      Result Value   RBC 5.42 (*)    HCT 46.9 (*)    All other components within normal limits  COMPREHENSIVE METABOLIC PANEL  MAGNESIUM  TROPONIN I (HIGH SENSITIVITY)    EKG None  Radiology DG Chest Port 1 View  Result Date: 05/04/2020 CLINICAL DATA:  Shortness of breath EXAM: PORTABLE CHEST 1 VIEW COMPARISON:  04/28/2020 FINDINGS: The heart size and mediastinal contours are within normal limits. Both lungs are  clear. The visualized skeletal structures are unremarkable. IMPRESSION: No active disease. Electronically Signed   By: Ulyses Jarred M.D.   On: 05/04/2020 22:23    Procedures Procedures (including critical care time)  Medications Ordered in ED Medications  diltiazem (CARDIZEM) 125 mg in dextrose 5% 125 mL (1 mg/mL) infusion (10 mg/hr Intravenous Rate/Dose Verify 05/04/20 2221)  diltiazem (CARDIZEM) injection 10 mg (10 mg Intravenous Given 05/04/20 2157)  diltiazem (CARDIZEM) injection 10 mg (10 mg Intravenous Given 05/04/20 2222)   CRITICAL CARE Performed by: Milton Ferguson Total critical care time:35 minutes Critical care time was exclusive of separately billable procedures and treating other patients. Critical care was necessary to treat or prevent imminent or life-threatening deterioration. Critical care was time spent personally by me on the following activities: development of treatment plan with patient and/or surrogate as well as nursing, discussions with consultants, evaluation of patient's response to treatment, examination of patient, obtaining history from patient or surrogate, ordering and performing treatments and interventions, ordering and review of laboratory studies, ordering and review of radiographic studies, pulse oximetry and re-evaluation of patient's condition.  ED Course  I have reviewed the triage vital signs and the nursing notes.  Pertinent labs & imaging results that were available during my care of the patient were reviewed by me and considered in my medical decision making (see chart for details).    MDM Rules/Calculators/A&P                      Patient with rapid atrial flutter put on Cardizem drip and admitted.       This patient presents to the ED for concern of palpitations, this involves an extensive number of treatment options, and is a complaint that carries with it a high risk of complications and morbidity.  The differential diagnosis includes  atrial fib V. tach   Lab Tests:   I Ordered, reviewed, and  interpreted labs, which included CBC chemistries troponins which were unremarkable  Medicines ordered:   I ordered medication Cardizem for atrial fib  Imaging Studies ordered:   I ordered imaging studies which included chest x-ray and  I independently visualized and interpreted imaging which showed unremarkable  Additional history obtained:   Additional history obtained from records  Previous records obtained and reviewed   Consultations Obtained:   I consulted hospitalist and discussed lab and imaging findings  Reevaluation:  After the interventions stated above, I reevaluated the patient and found improved  Critical Interventions:  .   Final Clinical Impression(s) / ED Diagnoses Final diagnoses:  None    Rx / DC Orders ED Discharge Orders    None       Milton Ferguson, MD 05/07/20 [phone]

## 2020-05-05 ENCOUNTER — Observation Stay (HOSPITAL_BASED_OUTPATIENT_CLINIC_OR_DEPARTMENT_OTHER): Payer: Medicare HMO

## 2020-05-05 DIAGNOSIS — I340 Nonrheumatic mitral (valve) insufficiency: Secondary | ICD-10-CM | POA: Diagnosis not present

## 2020-05-05 DIAGNOSIS — I4891 Unspecified atrial fibrillation: Secondary | ICD-10-CM

## 2020-05-05 DIAGNOSIS — I361 Nonrheumatic tricuspid (valve) insufficiency: Secondary | ICD-10-CM

## 2020-05-05 DIAGNOSIS — I10 Essential (primary) hypertension: Secondary | ICD-10-CM

## 2020-05-05 DIAGNOSIS — E785 Hyperlipidemia, unspecified: Secondary | ICD-10-CM | POA: Diagnosis not present

## 2020-05-05 DIAGNOSIS — E876 Hypokalemia: Secondary | ICD-10-CM

## 2020-05-05 DIAGNOSIS — I480 Paroxysmal atrial fibrillation: Secondary | ICD-10-CM

## 2020-05-05 DIAGNOSIS — K219 Gastro-esophageal reflux disease without esophagitis: Secondary | ICD-10-CM

## 2020-05-05 LAB — COMPREHENSIVE METABOLIC PANEL
ALT: 13 U/L (ref 0–44)
AST: 18 U/L (ref 15–41)
Albumin: 4 g/dL (ref 3.5–5.0)
Alkaline Phosphatase: 61 U/L (ref 38–126)
Anion gap: 11 (ref 5–15)
BUN: 17 mg/dL (ref 8–23)
CO2: 31 mmol/L (ref 22–32)
Calcium: 9.1 mg/dL (ref 8.9–10.3)
Chloride: 97 mmol/L — ABNORMAL LOW (ref 98–111)
Creatinine, Ser: 1.36 mg/dL — ABNORMAL HIGH (ref 0.44–1.00)
GFR calc Af Amer: 44 mL/min — ABNORMAL LOW (ref >60)
GFR calc non Af Amer: 38 mL/min — ABNORMAL LOW (ref >60)
Glucose, Bld: 112 mg/dL — ABNORMAL HIGH (ref 70–99)
Potassium: 3.4 mmol/L — ABNORMAL LOW (ref 3.5–5.1)
Sodium: 139 mmol/L (ref 135–145)
Total Bilirubin: 0.8 mg/dL (ref 0.3–1.2)
Total Protein: 6.9 g/dL (ref 6.5–8.1)

## 2020-05-05 LAB — ECHOCARDIOGRAM COMPLETE
Height: 62 in
Weight: 2797.2 oz

## 2020-05-05 LAB — CBC
HCT: 42.9 % (ref 36.0–46.0)
Hemoglobin: 13.5 g/dL (ref 12.0–15.0)
MCH: 27.8 pg (ref 26.0–34.0)
MCHC: 31.5 g/dL (ref 30.0–36.0)
MCV: 88.3 fL (ref 80.0–100.0)
Platelets: 286 10*3/uL (ref 150–400)
RBC: 4.86 MIL/uL (ref 3.87–5.11)
RDW: 13.6 % (ref 11.5–15.5)
WBC: 7.8 10*3/uL (ref 4.0–10.5)
nRBC: 0 % (ref 0.0–0.2)

## 2020-05-05 LAB — MRSA PCR SCREENING: MRSA by PCR: POSITIVE — AB

## 2020-05-05 LAB — TROPONIN I (HIGH SENSITIVITY): Troponin I (High Sensitivity): 35 ng/L — ABNORMAL HIGH (ref <18)

## 2020-05-05 LAB — SARS CORONAVIRUS 2 BY RT PCR (HOSPITAL ORDER, PERFORMED IN ~~LOC~~ HOSPITAL LAB): SARS Coronavirus 2: NEGATIVE

## 2020-05-05 LAB — TSH: TSH: 0.893 u[IU]/mL (ref 0.350–4.500)

## 2020-05-05 MED ORDER — HYDROCORTISONE 2.5 % RE CREA
1.0000 "application " | TOPICAL_CREAM | Freq: Two times a day (BID) | RECTAL | Status: DC | PRN
  Filled 2020-05-05: qty 28.35

## 2020-05-05 MED ORDER — FUROSEMIDE 20 MG PO TABS: 20.0000 mg | ORAL_TABLET | ORAL | Status: DC

## 2020-05-05 MED ORDER — CHLORHEXIDINE GLUCONATE CLOTH 2 % EX PADS: 6.0000 | MEDICATED_PAD | Freq: Every day | CUTANEOUS | Status: DC

## 2020-05-05 MED ORDER — TRIAMTERENE-HCTZ 75-50 MG PO TABS
0.5000 | ORAL_TABLET | Freq: Every day | ORAL | Status: DC
  Administered 2020-05-05: 0.5 via ORAL
  Filled 2020-05-05: qty 1

## 2020-05-05 MED ORDER — METOPROLOL SUCCINATE ER 25 MG PO TB24
25.0000 mg | ORAL_TABLET | Freq: Every day | ORAL | Status: DC
  Administered 2020-05-05: 25 mg via ORAL
  Filled 2020-05-05: qty 1

## 2020-05-05 MED ORDER — ATORVASTATIN CALCIUM 20 MG PO TABS
20.0000 mg | ORAL_TABLET | Freq: Every day | ORAL | Status: DC
  Administered 2020-05-05: 20 mg via ORAL
  Filled 2020-05-05: qty 1

## 2020-05-05 MED ORDER — APIXABAN 5 MG PO TABS
5.0000 mg | ORAL_TABLET | Freq: Two times a day (BID) | ORAL | Status: DC
  Administered 2020-05-05: 5 mg via ORAL
  Filled 2020-05-05: qty 1

## 2020-05-05 MED ORDER — MUPIROCIN 2 % EX OINT: TOPICAL_OINTMENT | Freq: Two times a day (BID) | CUTANEOUS | Status: DC

## 2020-05-05 MED ORDER — CYPROHEPTADINE HCL 4 MG PO TABS
4.0000 mg | ORAL_TABLET | Freq: Three times a day (TID) | ORAL | Status: DC | PRN
  Filled 2020-05-05: qty 1

## 2020-05-05 MED ORDER — FLUTICASONE PROPIONATE 50 MCG/ACT NA SUSP: 1.0000 | Freq: Every day | NASAL | Status: DC | PRN

## 2020-05-05 MED ORDER — COLCHICINE 0.6 MG PO TABS
0.6000 mg | ORAL_TABLET | Freq: Every day | ORAL | Status: DC
  Administered 2020-05-05: 0.6 mg via ORAL
  Filled 2020-05-05: qty 1

## 2020-05-05 MED ORDER — LUBIPROSTONE 24 MCG PO CAPS
24.0000 ug | ORAL_CAPSULE | Freq: Two times a day (BID) | ORAL | Status: DC
  Administered 2020-05-05: 24 ug via ORAL
  Filled 2020-05-05: qty 1

## 2020-05-05 MED ORDER — SIMVASTATIN 20 MG PO TABS: 40.0000 mg | ORAL_TABLET | Freq: Every day | ORAL | Status: DC

## 2020-05-05 MED ORDER — POTASSIUM CHLORIDE CRYS ER 20 MEQ PO TBCR
40.0000 meq | EXTENDED_RELEASE_TABLET | Freq: Every day | ORAL | Status: DC
  Administered 2020-05-05: 40 meq via ORAL
  Filled 2020-05-05: qty 2

## 2020-05-05 MED ORDER — LORATADINE 10 MG PO TABS
10.0000 mg | ORAL_TABLET | Freq: Every day | ORAL | Status: DC
  Administered 2020-05-05: 10 mg via ORAL
  Filled 2020-05-05: qty 1

## 2020-05-05 MED ORDER — MECLIZINE HCL 12.5 MG PO TABS: 25.0000 mg | ORAL_TABLET | Freq: Three times a day (TID) | ORAL | Status: DC | PRN

## 2020-05-05 MED ORDER — PANTOPRAZOLE SODIUM 40 MG PO TBEC
40.0000 mg | DELAYED_RELEASE_TABLET | Freq: Every day | ORAL | Status: DC
  Administered 2020-05-05: 40 mg via ORAL
  Filled 2020-05-05: qty 1

## 2020-05-05 MED ORDER — DILTIAZEM HCL 30 MG PO TABS: 30.0000 mg | ORAL_TABLET | Freq: Every day | ORAL | 1 refills | Status: DC | PRN

## 2020-05-05 MED ORDER — MOMETASONE FURO-FORMOTEROL FUM 200-5 MCG/ACT IN AERO
2.0000 | INHALATION_SPRAY | Freq: Two times a day (BID) | RESPIRATORY_TRACT | Status: DC
  Administered 2020-05-05: 2 via RESPIRATORY_TRACT
  Filled 2020-05-05: qty 8.8

## 2020-05-05 NOTE — Progress Notes (Signed)
Patient's heart rate 40's-50's on arrival to ICU. Cardizem drip stopped, MD aware.

## 2020-05-05 NOTE — Discharge Summary (Signed)
Physician Discharge Summary  [name] [name] ALP:[id number] DOB: 1946/03/05 DOA: 05/04/2020  PCP: Andres Shad, MD  Admit date: 05/04/2020 Discharge date: 05/05/2020  Admitted From:  Home  Disposition: Home   Recommendations for Outpatient Follow-up:  1. Follow up with PCP in 2 weeks 2. Follow up with EP Cardiology Clinic as scheduled 3. Follow up with primary cardiology clinic as scheduled   Discharge Condition: STABLE   CODE STATUS: FULL    Brief Hospitalization Summary: Please see all hospital notes, images, labs for full details of the hospitalization. ADMISSION HPI: [name] [name] is a 74 y.o. female with medical history significant of GERD, gout, hyperlipidemia, hypertension, hypokalemia, paroxysmal atrial fibrillation, PUD, neurocardiogenic syncopal episode who is coming to the emergency department with complaints of on and off chest pain since earlier in the evening associated with dyspnea and palpitations.  She had a similar episode 6 days ago and was diagnosed with atrial fibrillation.  She was seen by cardiology yesterday. She denies diaphoresis, dizziness, nausea, emesis, PND, orthopnea or recent lower extremity edema.  She denies headache, fever, chills, sore throat, rhinorrhea, wheezing or hemoptysis.  Denies abdominal pain, diarrhea, constipation, melena or hematochezia.  No dysuria, frequency or hematuria.  No polyuria, polydipsia, polyphagia or blurred vision.  ED Course: Initial vital signs temperature 98 F, pulse 144, respirations 18, BP 132/96 mmHg and O2 sat 96% on room air.  The patient was given 10 mg of Cardizem and IVP and was started on a Cardizem continuous infusion.  I added magnesium sulfate 2 g IVPB and 40 mEq of potassium p.o. x1.  Her CBC showed a white count 7.9, hemoglobin 15.0 g/dL and platelets 311.  Magnesium was 2.1 mg/dL.  EKG was atrial fibrillation with RVR 129 bpm.  Troponin was 20 and then 35 ng/L.  SARS coronavirus 2 was negative.  CMP  shows a potassium of 3.2 mmol/L.  Glucose of 119 and creatinine 1.29 mg/dL.  The rest of the CMP measurements are within expected values.  Her chest radiograph did not have any acute cardiopulmonary pathology.   Patient was admitted with A. fib RVR.  Patient responded well to IV Cardizem and had a spontaneous conversion to normal sinus rhythm.  She was seen by the inpatient cardiology service and they recommended continuing the Toprol at the current dose.  Continue apixaban as ordered.  They recommended that she have a diltiazem 30 mg tablet available as needed for palpitations and elevated heart rate use only as needed.  They are making arrangements for her to follow-up with the EP cardiology service.  The patient is feeling much better and stable to discharge home with the recommended outpatient follow-up.  Discharge Diagnoses:  Principal Problem:   Atrial fibrillation with RVR ([place]) Active Problems:   Essential hypertension, benign   Constipation   GERD (gastroesophageal reflux disease)   Hyperlipidemia   Gout   Discharge Instructions: Discharge Instructions    Amb referral to [facility]IB Clinic   Complete by: As directed      Allergies as of 05/05/2020      Reactions   Acetaminophen-codeine Hives   Allopurinol Hives   Amoxicillin Hives   Did it involve swelling of the face/tongue/throat, SOB, or low BP? No Did it involve sudden or severe rash/hives, skin peeling, or any reaction on the inside of your mouth or nose? No Did you need to seek medical attention at a hospital or doctor's office? No When did it last happen?10 + years If all above  answers are "NO", may proceed with cephalosporin use.   Tramadol Hives      Medication List    TAKE these medications   acetaminophen 500 MG tablet Commonly known as: TYLENOL Take 1,000 mg by mouth every 6 (six) hours as needed for moderate pain.   albuterol 108 (90 Base) MCG/ACT inhaler Commonly known as: VENTOLIN HFA   apixaban 5  MG Tabs tablet Commonly known as: ELIQUIS Take 1 tablet (5 mg total) by mouth 2 (two) times daily.   APPLE CIDER VINEGAR PO Take 2 capsules by mouth daily.   cetirizine 10 MG tablet Commonly known as: ZYRTEC Take 10 mg by mouth daily as needed for allergies.   colchicine 0.6 MG tablet Take 0.6 mg by mouth daily.   cyproheptadine 4 MG tablet Commonly known as: PERIACTIN Take 4 mg by mouth 3 (three) times daily as needed for allergies.   diclofenac Sodium 1 % Gel Commonly known as: VOLTAREN Apply 1 application topically 4 (four) times daily as needed (pain).   diltiazem 30 MG tablet Commonly known as: CARDIZEM Take 1 tablet (30 mg total) by mouth daily as needed (palpitations).   ELDERBERRY PO Take 2 capsules by mouth daily.   fluticasone 50 MCG/ACT nasal spray Commonly known as: FLONASE Place 1 spray into both nostrils daily as needed for allergies or rhinitis.   Fluticasone-Salmeterol 500-50 MCG/DOSE Aepb Commonly known as: ADVAIR Inhale 1 puff into the lungs every 12 (twelve) hours.   furosemide 20 MG tablet Commonly known as: LASIX Take 1 tablet (20 mg total) by mouth every 3 (three) days. Start taking on: May 08, 2020 What changed: when to take this   hydrocortisone 2.5 % rectal cream Commonly known as: Proctosol HC PLACE RECTALLY 2 (TWO) TIMES DAILY. NO MORE THAN 14 DAYS AT A TIME. What changed:   how much to take  how to take this  when to take this  reasons to take this  additional instructions   lubiprostone 24 MCG capsule Commonly known as: Amitiza TAKE 1 CAPSULE TWICE DAILY WITH A MEAL What changed:   how much to take  how to take this  when to take this  additional instructions   meclizine 25 MG tablet Commonly known as: ANTIVERT Take 25 mg by mouth 3 (three) times daily as needed for dizziness.   metoprolol succinate 25 MG 24 hr tablet Commonly known as: TOPROL-XL TAKE 1 TABLET EVERY DAY   omeprazole 40 MG capsule Commonly  known as: PRILOSEC Take 40 mg by mouth daily.   potassium chloride SA 20 MEQ tablet Commonly known as: KLOR-CON Take 20 mEq by mouth in the morning, at noon, and at bedtime.   simvastatin 40 MG tablet Commonly known as: ZOCOR Take 40 mg by mouth daily in the afternoon.   triamterene-hydrochlorothiazide 75-50 MG tablet Commonly known as: MAXZIDE Take 1 tablet by mouth daily.   vitamin C 250 MG tablet Commonly known as: ASCORBIC ACID Take 500 mg by mouth daily.      Follow-up Information    Evans Lance, MD Follow up on 05/27/2020.   Specialty: Cardiology Why: Electrophysiology Follow-up with Dr. Lovena Le to discuss your atrial fibrillation on 05/27/2020 at 8:45 AM.  Contact information: [place] [address] [phone]        Andres Shad, MD. Schedule an appointment as soon as possible for a visit in 2 week(s).   Specialty: Family Medicine Contact information: [address] [place] [address] [phone]  Allergies  Allergen Reactions  . Acetaminophen-Codeine Hives  . Allopurinol Hives  . Amoxicillin Hives    Did it involve swelling of the face/tongue/throat, SOB, or low BP? No Did it involve sudden or severe rash/hives, skin peeling, or any reaction on the inside of your mouth or nose? No Did you need to seek medical attention at a hospital or doctor's office? No When did it last happen?10 + years If all above answers are "NO", may proceed with cephalosporin use.   . Tramadol Hives   Allergies as of 05/05/2020      Reactions   Acetaminophen-codeine Hives   Allopurinol Hives   Amoxicillin Hives   Did it involve swelling of the face/tongue/throat, SOB, or low BP? No Did it involve sudden or severe rash/hives, skin peeling, or any reaction on the inside of your mouth or nose? No Did you need to seek medical attention at a hospital or doctor's office? No When did it last happen?10 + years If all above answers  are "NO", may proceed with cephalosporin use.   Tramadol Hives      Medication List    TAKE these medications   acetaminophen 500 MG tablet Commonly known as: TYLENOL Take 1,000 mg by mouth every 6 (six) hours as needed for moderate pain.   albuterol 108 (90 Base) MCG/ACT inhaler Commonly known as: VENTOLIN HFA   apixaban 5 MG Tabs tablet Commonly known as: ELIQUIS Take 1 tablet (5 mg total) by mouth 2 (two) times daily.   APPLE CIDER VINEGAR PO Take 2 capsules by mouth daily.   cetirizine 10 MG tablet Commonly known as: ZYRTEC Take 10 mg by mouth daily as needed for allergies.   colchicine 0.6 MG tablet Take 0.6 mg by mouth daily.   cyproheptadine 4 MG tablet Commonly known as: PERIACTIN Take 4 mg by mouth 3 (three) times daily as needed for allergies.   diclofenac Sodium 1 % Gel Commonly known as: VOLTAREN Apply 1 application topically 4 (four) times daily as needed (pain).   diltiazem 30 MG tablet Commonly known as: CARDIZEM Take 1 tablet (30 mg total) by mouth daily as needed (palpitations).   ELDERBERRY PO Take 2 capsules by mouth daily.   fluticasone 50 MCG/ACT nasal spray Commonly known as: FLONASE Place 1 spray into both nostrils daily as needed for allergies or rhinitis.   Fluticasone-Salmeterol 500-50 MCG/DOSE Aepb Commonly known as: ADVAIR Inhale 1 puff into the lungs every 12 (twelve) hours.   furosemide 20 MG tablet Commonly known as: LASIX Take 1 tablet (20 mg total) by mouth every 3 (three) days. Start taking on: May 08, 2020 What changed: when to take this   hydrocortisone 2.5 % rectal cream Commonly known as: Proctosol HC PLACE RECTALLY 2 (TWO) TIMES DAILY. NO MORE THAN 14 DAYS AT A TIME. What changed:   how much to take  how to take this  when to take this  reasons to take this  additional instructions   lubiprostone 24 MCG capsule Commonly known as: Amitiza TAKE 1 CAPSULE TWICE DAILY WITH A MEAL What changed:   how much  to take  how to take this  when to take this  additional instructions   meclizine 25 MG tablet Commonly known as: ANTIVERT Take 25 mg by mouth 3 (three) times daily as needed for dizziness.   metoprolol succinate 25 MG 24 hr tablet Commonly known as: TOPROL-XL TAKE 1 TABLET EVERY DAY   omeprazole 40 MG capsule Commonly known as: PRILOSEC  Take 40 mg by mouth daily.   potassium chloride SA 20 MEQ tablet Commonly known as: KLOR-CON Take 20 mEq by mouth in the morning, at noon, and at bedtime.   simvastatin 40 MG tablet Commonly known as: ZOCOR Take 40 mg by mouth daily in the afternoon.   triamterene-hydrochlorothiazide 75-50 MG tablet Commonly known as: MAXZIDE Take 1 tablet by mouth daily.   vitamin C 250 MG tablet Commonly known as: ASCORBIC ACID Take 500 mg by mouth daily.       Procedures/Studies: DG Chest Port 1 View  Result Date: 05/04/2020 CLINICAL DATA:  Shortness of breath EXAM: PORTABLE CHEST 1 VIEW COMPARISON:  04/28/2020 FINDINGS: The heart size and mediastinal contours are within normal limits. Both lungs are clear. The visualized skeletal structures are unremarkable. IMPRESSION: No active disease. Electronically Signed   By: Ulyses Jarred M.D.   On: 05/04/2020 22:23   DG Chest Port 1 View  Result Date: 04/28/2020 CLINICAL DATA:  Shortness of breath since last evening. EXAM: PORTABLE CHEST 1 VIEW COMPARISON:  05/06/2019 FINDINGS: External pacer paddles are noted. The heart is normal in size given the AP projection and portable technique. There is tortuosity and calcification of the thoracic aorta. The lungs are clear of an acute process. No pleural effusions or pneumothorax. Suspect underlying emphysematous changes. IMPRESSION: No acute cardiopulmonary findings. Electronically Signed   By: Marijo Sanes M.D.   On: 04/28/2020 13:13   ECHOCARDIOGRAM COMPLETE  Result Date: 05/05/2020    ECHOCARDIOGRAM REPORT   Patient Name:   [name] [name] [place] Date of Exam:  05/05/2020 Medical Rec #:  [id number]       Height:       62.0 in Accession #:    [id number]      Weight:       174.8 lb Date of Birth:  1946-06-07       BSA:          1.805 m Patient Age:    89 years        BP:           142/59 mmHg Patient Gender: F               HR:           55 bpm. Exam Location:  [facility] Procedure: 2D Echo, Cardiac Doppler and Color Doppler Indications:    I48.0 Paroxysmal atrial fibrillation  History:        Patient has prior history of Echocardiogram examinations.                 Signs/Symptoms:Syncope; Risk Factors:Hypertension, Dyslipidemia                 and GERD.  Sonographer:    Jonelle Sidle Dance Referring Phys: [id number] [place]  1. Left ventricular ejection fraction, by estimation, is 65 to 70%. The left ventricle has normal function. The left ventricle has no regional wall motion abnormalities. There is moderate left ventricular hypertrophy of the posterior segment. Left ventricular diastolic parameters are consistent with Grade II diastolic dysfunction (pseudonormalization). Elevated left atrial pressure.  2. Right ventricular systolic function is normal. The right ventricular size is normal. There is normal pulmonary artery systolic pressure.  3. Left atrial size was moderately dilated.  4. Nodular calcification on posterior mitral leaflet measuring 1.56 cm x 1.65 cm. This may represent a calcified vegetation. The mitral valve is degenerative. Mild mitral valve regurgitation. Moderate annular calcification. Chordal calcification also noted.  5. The aortic valve is tricuspid. Aortic valve regurgitation is not visualized. Mild aortic valve sclerosis is present, with no evidence of aortic valve stenosis.  6. The inferior vena cava is normal in size with greater than 50% respiratory variability, suggesting right atrial pressure of 3 mmHg. FINDINGS  Left Ventricle: Left ventricular ejection fraction, by estimation, is 65 to 70%. The left ventricle has normal function.  The left ventricle has no regional wall motion abnormalities. The left ventricular internal cavity size was normal in size. There is  moderate left ventricular hypertrophy of the posterior segment. Left ventricular diastolic parameters are consistent with Grade II diastolic dysfunction (pseudonormalization). Elevated left atrial pressure. Right Ventricle: The right ventricular size is normal. No increase in right ventricular wall thickness. Right ventricular systolic function is normal. There is normal pulmonary artery systolic pressure. The tricuspid regurgitant velocity is 2.76 m/s, and  with an assumed right atrial pressure of 3 mmHg, the estimated right ventricular systolic pressure is 15.4 mmHg. Left Atrium: Left atrial size was moderately dilated. Right Atrium: Right atrial size was normal in size. Pericardium: There is no evidence of pericardial effusion. Mitral Valve: Nodular calcification on posterior mitral leaflet measuring 1.56 cm x 1.65 cm. This may represent a calcified vegetation. The mitral valve is degenerative in appearance. There is mild thickening of the mitral valve leaflet(s). There is mild  calcification of the mitral valve leaflet(s). Moderate mitral annular calcification. Mild mitral valve regurgitation. Tricuspid Valve: The tricuspid valve is grossly normal. Tricuspid valve regurgitation is mild. Aortic Valve: The aortic valve is tricuspid. . There is mild thickening and mild calcification of the aortic valve. Aortic valve regurgitation is not visualized. Mild aortic valve sclerosis is present, with no evidence of aortic valve stenosis. Moderate aortic valve annular calcification. There is mild thickening of the aortic valve. There is mild calcification of the aortic valve. Pulmonic Valve: The pulmonic valve was grossly normal. Pulmonic valve regurgitation is not visualized. Aorta: The aortic root is normal in size and structure. Venous: The inferior vena cava is normal in size with greater  than 50% respiratory variability, suggesting right atrial pressure of 3 mmHg. IAS/Shunts: No atrial level shunt detected by color flow Doppler.  LEFT VENTRICLE PLAX 2D LVIDd:         4.05 cm LVIDs:         2.33 cm LV PW:         1.68 cm LV IVS:        1.14 cm LVOT diam:     1.80 cm LV SV:         77 LV SV Index:   42 LVOT Area:     2.54 cm  RIGHT VENTRICLE RV Basal diam:  2.40 cm RV S prime:     14.00 cm/s TAPSE (M-mode): 1.9 cm LEFT ATRIUM             Index       RIGHT ATRIUM           Index LA diam:        3.90 cm 2.16 cm/m  RA Area:     14.20 cm LA Vol (A2C):   63.3 ml 35.06 ml/m RA Volume:   35.30 ml  19.55 ml/m LA Vol (A4C):   73.4 ml 40.65 ml/m LA Biplane Vol: 69.2 ml 38.33 ml/m  AORTIC VALVE LVOT Vmax:   137.00 cm/s LVOT Vmean:  85.000 cm/s LVOT VTI:    0.301 m  AORTA Ao Root diam: 3.10  cm Ao Asc diam:  2.50 cm MITRAL VALVE                TRICUSPID VALVE MV Area (PHT): 1.55 cm     TR Peak grad:   30.5 mmHg MV Decel Time: 489 msec     TR Vmax:        276.00 cm/s MV E velocity: 140.00 cm/s MV A velocity: 125.00 cm/s  SHUNTS MV E/A ratio:  1.12         Systemic VTI:  0.30 m                             Systemic Diam: 1.80 cm Kate Sable MD Electronically signed by Kate Sable MD Signature Date/Time: 05/05/2020/12:00:23 PM    Final       Subjective: Patient says she feels much better now.  Her heart rate has returned to normal sinus rhythm.  She would like to go home today.  Discharge Exam: Vitals:   05/05/20 1134 05/05/20 1200  BP:  (!) 156/66  Pulse: (!) 55 65  Resp:    Temp: 97.9 F (36.6 C)   SpO2: 100% 99%   Vitals:   05/05/20 0800 05/05/20 0852 05/05/20 1134 05/05/20 1200  BP: 128/68   (!) 156/66  Pulse: (!) 58  (!) 55 65  Resp: 18     Temp:   97.9 F (36.6 C)   TempSrc:   Oral   SpO2: 97% 100% 100% 99%  Weight:      Height:        General: Pt is alert, awake, not in acute distress Cardiovascular: RRR, S1/S2 +, no rubs, no gallops Respiratory: CTA  bilaterally, no wheezing, no rhonchi Abdominal: Soft, NT, ND, bowel sounds + Extremities: no edema, no cyanosis   The results of significant diagnostics from this hospitalization (including imaging, microbiology, ancillary and laboratory) are listed below for reference.     Microbiology: Recent Results (from the past 240 hour(s))  SARS Coronavirus 2 by RT PCR (hospital order, performed in [facility] hospital lab) Nasopharyngeal Nasopharyngeal Swab     Status: None   Collection Time: 05/04/20 11:23 PM   Specimen: Nasopharyngeal Swab  Result Value Ref Range Status   SARS Coronavirus 2 NEGATIVE NEGATIVE Final    Comment: (NOTE) SARS-CoV-2 target nucleic acids are NOT DETECTED. The SARS-CoV-2 RNA is generally detectable in upper and lower respiratory specimens during the acute phase of infection. The lowest concentration of SARS-CoV-2 viral copies this assay can detect is 250 copies / mL. A negative result does not preclude SARS-CoV-2 infection and should not be used as the sole basis for treatment or other patient management decisions.  A negative result may occur with improper specimen collection / handling, submission of specimen other than nasopharyngeal swab, presence of viral mutation(s) within the areas targeted by this assay, and inadequate number of viral copies (<250 copies / mL). A negative result must be combined with clinical observations, patient history, and epidemiological information. Fact Sheet for Patients:   StrictlyIdeas.no Fact Sheet for Healthcare Providers: BankingDealers.co.za This test is not yet approved or cleared  by the [place] FDA and has been authorized for detection and/or diagnosis of SARS-CoV-2 by FDA under an Emergency Use Authorization (EUA).  This EUA will remain in effect (meaning this test can be used) for the duration of the COVID-19 declaration under Section 564(b)(1) of the Act, 21  U.S.C. section 360bbb-3(b)(1), unless the  authorization is terminated or revoked sooner. Performed at [facility], [address] [place], [place] [address]   MRSA PCR Screening     Status: Abnormal   Collection Time: 05/05/20  1:22 AM   Specimen: Nasal Mucosa; Nasopharyngeal  Result Value Ref Range Status   MRSA by PCR POSITIVE (A) NEGATIVE Final    Comment:        The GeneXpert MRSA Assay (FDA approved for NASAL specimens only), is one component of a comprehensive MRSA colonization surveillance program. It is not intended to diagnose MRSA infection nor to guide or monitor treatment for MRSA infections. RESULT CALLED TO, READ BACK BY AND VERIFIED WITH: DISHMON,M AT 0720 Y HUFFINES,S ON 05/05/20. Performed at [facility], [address] [place], [place] [address]      Labs: BNP (last 3 results) Recent Labs    05/06/19 1443  BNP 149.7*   Basic Metabolic Panel: Recent Labs  Lab 05/04/20 2149 05/05/20 0447  NA 136 139  K 3.2* 3.4*  CL 98 97*  CO2 28 31  GLUCOSE 119* 112*  BUN 18 17  CREATININE 1.29* 1.36*  CALCIUM 9.4 9.1  MG 2.1  --    Liver Function Tests: Recent Labs  Lab 05/04/20 2149 05/05/20 0447  AST 22 18  ALT 14 13  ALKPHOS 80 61  BILITOT 0.5 0.8  PROT 8.0 6.9  ALBUMIN 4.5 4.0   No results for input(s): LIPASE, AMYLASE in the last 168 hours. No results for input(s): AMMONIA in the last 168 hours. CBC: Recent Labs  Lab 05/04/20 2149 05/05/20 0447  WBC 7.9 7.8  NEUTROABS 3.8  --   HGB 15.0 13.5  HCT 46.9* 42.9  MCV 86.5 88.3  PLT 311 286   Cardiac Enzymes: No results for input(s): CKTOTAL, CKMB, CKMBINDEX, TROPONINI in the last 168 hours. BNP: Invalid input(s): POCBNP CBG: No results for input(s): GLUCAP in the last 168 hours. D-Dimer No results for input(s): DDIMER in the last 72 hours. Hgb A1c No results for input(s): HGBA1C in the last 72 hours. Lipid Profile No results for input(s): CHOL, HDL, LDLCALC, TRIG, CHOLHDL,  LDLDIRECT in the last 72 hours. Thyroid function studies Recent Labs    05/05/20 0003  TSH 0.893   Anemia work up No results for input(s): VITAMINB12, FOLATE, FERRITIN, TIBC, IRON, RETICCTPCT in the last 72 hours. Urinalysis No results found for: COLORURINE, APPEARANCEUR, [place], [place], [place], [place], [place], [place], PROTEINUR, UROBILINOGEN, NITRITE, LEUKOCYTESUR Sepsis Labs Invalid input(s): PROCALCITONIN,  WBC,  LACTICIDVEN Microbiology Recent Results (from the past 240 hour(s))  SARS Coronavirus 2 by RT PCR (hospital order, performed in [facility] hospital lab) Nasopharyngeal Nasopharyngeal Swab     Status: None   Collection Time: 05/04/20 11:23 PM   Specimen: Nasopharyngeal Swab  Result Value Ref Range Status   SARS Coronavirus 2 NEGATIVE NEGATIVE Final    Comment: (NOTE) SARS-CoV-2 target nucleic acids are NOT DETECTED. The SARS-CoV-2 RNA is generally detectable in upper and lower respiratory specimens during the acute phase of infection. The lowest concentration of SARS-CoV-2 viral copies this assay can detect is 250 copies / mL. A negative result does not preclude SARS-CoV-2 infection and should not be used as the sole basis for treatment or other patient management decisions.  A negative result may occur with improper specimen collection / handling, submission of specimen other than nasopharyngeal swab, presence of viral mutation(s) within the areas targeted by this assay, and inadequate number of viral copies (<250 copies / mL). A negative  result must be combined with clinical observations, patient history, and epidemiological information. Fact Sheet for Patients:   StrictlyIdeas.no Fact Sheet for Healthcare Providers: BankingDealers.co.za This test is not yet approved or cleared  by the [place] FDA and has been authorized for detection and/or diagnosis of SARS-CoV-2 by FDA under an Emergency Use  Authorization (EUA).  This EUA will remain in effect (meaning this test can be used) for the duration of the COVID-19 declaration under Section 564(b)(1) of the Act, 21 U.S.C. section 360bbb-3(b)(1), unless the authorization is terminated or revoked sooner. Performed at [facility], [address], [place], [place] [address]   MRSA PCR Screening     Status: Abnormal   Collection Time: 05/05/20  1:22 AM   Specimen: Nasal Mucosa; Nasopharyngeal  Result Value Ref Range Status   MRSA by PCR POSITIVE (A) NEGATIVE Final    Comment:        The GeneXpert MRSA Assay (FDA approved for NASAL specimens only), is one component of a comprehensive MRSA colonization surveillance program. It is not intended to diagnose MRSA infection nor to guide or monitor treatment for MRSA infections. RESULT CALLED TO, READ BACK BY AND VERIFIED WITH: DISHMON,M AT 0720 Y HUFFINES,S ON 05/05/20. Performed at [facility], [address], [place], [place] [address]    Time coordinating discharge:   SIGNED:  Irwin Brakeman, MD  [facility] 05/05/2020, 12:51 PM How to contact the [facility] Attending or Consulting provider [place] or covering provider during after hours Connell, for this patient?  1. Check the care team in [facility] and look for a) attending/consulting TR[facility] provider listed and b) the [facility] team listed 2. Log into [email or web address] and use [place]'s universal password to access. If you do not have the password, please contact the hospital operator. 3. Locate the [facility] provider you are looking for under [facility] and page to a number that you can be directly reached. 4. If you still have difficulty reaching the provider, please page the [facility] (Director on Call) for the Hospitalists listed on amion for assistance.

## 2020-05-05 NOTE — Consult Note (Addendum)
Cardiology Consult    Patient ID: [name] [name]; [id number]; [birth year]   Admit date: 05/04/2020 Date of Consult: 05/05/2020  Primary Care Provider: Andres Shad, MD Primary Cardiologist: Rozann Lesches, MD   Patient Profile    [name] [name] is a 74 y.o. female with past medical history of HTN, HLD, history of vasovagal syncope and recent episode of paroxysmal atrial fibrillation (occurring on 04/28/2020 and converted back to NSR with IV Cardizem) who is being seen today for the evaluation of atrial fibrillation with RVR at the request of Dr. Wynetta Emery.   History of Present Illness    [name] [name] was recently evaluated at [facility] ED on 04/28/2020 for palpitations and dyspnea, found to be in new-onset atrial fibrillation with RVR. Was found to be hypokalemic with K+ at 3.3. She was started on IV Cardizem and was going to undergo DCCV while in the ED given recent onset of symptoms but converted back to NSR prior to the procedure. She was continued on Toprol-XL 25mg  daily and started on Eliquis 5mg  BID.   She was just evaluated by myself on 05/03/2020 and reported doing well since her ED visit and denied any recurrent palpitations. Reported taking both HCTZ along with Lasix 20mg  every other day as she developed worsening fluid accumulation when Lasix was discontinued in the past. Given her mildly elevated creatinine and recent hypokalemia, it was recommended she only take Lasix every 3rd day. An echocardiogram was arranged to assess for any structural abnormalities along with routine labs.   She presented back to [facility] ED on 05/04/2020 for evaluation of dyspnea and chest discomfort which had started a few hours prior to arrival. No unusual activities during the day. Was eating a snack when symptoms started. No alcohol use. Does consume 1 cup of coffee daily and chocolate occasionally. Initial labs showed WBC 7.9, Hgb 15.0, platelets 311, Na+ 136, K+ 3.2 and creatinine 1.29. Mg 2.1.  Initial HS Troponin 28 with repeat of 35. Mg 2.1. COVID-19 negative. CXR with no acute cardiopulmonary disease. EKG showed atrial fibrillation with RVR, HR 128 and LVH with repol.   She was started on IV Cardizem at the time of admission and converted to NSR around midnight. Has been in sinus rhythm with HR in the 50's to 60's since with occasional PVC's. She also received K+ and Mg supplementation. Reports feeling back to baseline this morning.    Past Medical History:  Diagnosis Date  . GERD (gastroesophageal reflux disease)   . Gout   . Hyperlipidemia   . Hypertension   . Hypokalemia   . PAF (paroxysmal atrial fibrillation) ([place])   . PUD (peptic ulcer disease)   . Syncope    Neurocardiogenic    Past Surgical History:  Procedure Laterality Date  . ABDOMINAL HYSTERECTOMY    . CATARACT EXTRACTION Bilateral   . COLONOSCOPY N/A 10/07/2014   Dr. Oneida Alar: redundant left colon, moderate sized external hemorrhoids   . COLONOSCOPY N/A 03/11/2020   Procedure: COLONOSCOPY;  Surgeon: Danie Binder, MD;  Location: [facility];  Service: Endoscopy;  Laterality: N/A;  9:30am w/ overtube  . LIPOMA RESECTION    . POLYPECTOMY  03/11/2020   Procedure: POLYPECTOMY;  Surgeon: Danie Binder, MD;  Location: [facility] END[facility];  Service: Endoscopy;;  cecal, transverse, descending, sigmoid     Home Medications:  Prior to Admission medications   Medication Sig Start Date End Date Taking? Authorizing Provider  acetaminophen (TYLENOL) 500 MG tablet Take  1,000 mg by mouth every 6 (six) hours as needed for moderate pain.   Yes [provider]  albuterol (VENTOLIN HFA) 108 (90 Base) MCG/ACT inhaler  04/28/20  Yes [provider]  apixaban (ELIQUIS) 5 MG TABS tablet Take 1 tablet (5 mg total) by mouth 2 (two) times daily. 05/03/20 04/28/21 Yes Strader, Fransisco Hertz, PA-C  cyproheptadine (PERIACTIN) 4 MG tablet Take 4 mg by mouth 3 (three) times daily as needed for allergies.   Yes [provider]  Fluticasone-Salmeterol (ADVAIR) 500-50 MCG/DOSE AEPB Inhale 1 puff into the lungs every 12 (twelve) hours.   Yes [provider]  furosemide (LASIX) 20 MG tablet Take 20 mg by mouth every other day.    Yes [provider]  lubiprostone (AMITIZA) 24 MCG capsule TAKE 1 CAPSULE TWICE DAILY WITH A MEAL Patient taking differently: Take 24 mcg by mouth in the morning and at bedtime.  08/11/19  Yes Mahala Menghini, PA-C  meclizine (ANTIVERT) 25 MG tablet Take 25 mg by mouth 3 (three) times daily as needed for dizziness.    Yes [provider]  metoprolol succinate (TOPROL-XL) 25 MG 24 hr tablet TAKE 1 TABLET EVERY DAY Patient taking differently: Take 25 mg by mouth daily.  01/08/17  Yes Satira Sark, MD  omeprazole (PRILOSEC) 40 MG capsule Take 40 mg by mouth daily.     Yes [provider]  potassium chloride SA (KLOR-CON) 20 MEQ tablet Take 20 mEq by mouth in the morning, at noon, and at bedtime.   Yes [provider]  simvastatin (ZOCOR) 40 MG tablet Take 40 mg by mouth daily in the afternoon.    Yes [provider]  APPLE CIDER VINEGAR PO Take 2 capsules by mouth daily.    [provider]  cetirizine (ZYRTEC) 10 MG tablet Take 10 mg by mouth daily as needed for allergies.    [provider]  colchicine 0.6 MG tablet Take 0.6 mg by mouth daily. 01/31/20   [provider]  diclofenac Sodium (VOLTAREN) 1 % GEL Apply 1 application topically 4 (four) times daily as needed (pain).    [provider]  ELDERBERRY PO Take 2 capsules by mouth daily.    [provider]  fluticasone (FLONASE) 50 MCG/ACT nasal spray Place 1 spray into both nostrils daily as needed for allergies or rhinitis.    [provider]  hydrocortisone (PROCTOSOL HC) 2.5 % rectal cream PLACE RECTALLY 2 (TWO) TIMES DAILY. NO MORE THAN 14 DAYS AT A TIME. Patient taking differently: Place 1 application rectally 2 (two)  times daily as needed for hemorrhoids.  12/16/18   Mahala Menghini, PA-C  triamterene-hydrochlorothiazide (MAXZIDE) 75-50 MG per tablet Take 1 tablet by mouth daily.    [provider]  vitamin C (ASCORBIC ACID) 250 MG tablet Take 500 mg by mouth daily.    [provider]    Inpatient Medications: Scheduled Meds: . apixaban  5 mg Oral BID  . atorvastatin  20 mg Oral Daily  . colchicine  0.6 mg Oral Daily  . [START ON 05/06/2020] furosemide  20 mg Oral QODAY  . loratadine  10 mg Oral Daily  . lubiprostone  24 mcg Oral BID WC  . metoprolol succinate  25 mg Oral Daily  . mometasone-formoterol  2 puff Inhalation BID  . pantoprazole  40 mg Oral Daily  . potassium chloride SA  40 mEq Oral Daily  . triamterene-hydrochlorothiazide  0.5 tablet Oral Daily  Continuous Infusions: . diltiazem (CARDIZEM) infusion Stopped (05/05/20 0125)   PRN Meds: cyproheptadine, fluticasone, hydrocortisone, meclizine  Allergies:    Allergies  Allergen Reactions  . Acetaminophen-Codeine Hives  . Allopurinol Hives  . Amoxicillin Hives    Did it involve swelling of the face/tongue/throat, SOB, or low BP? No Did it involve sudden or severe rash/hives, skin peeling, or any reaction on the inside of your mouth or nose? No Did you need to seek medical attention at a hospital or doctor's office? No When did it last happen?10 + years If all above answers are "NO", may proceed with cephalosporin use.   . Tramadol Hives    Social History:   Social History   Socioeconomic History  . Marital status: Single    Spouse name: Not on file  . Number of children: Not on file  . Years of education: Not on file  . Highest education level: Not on file  Occupational History  . Occupation: Retired  Tobacco Use  . Smoking status: Former Smoker    Packs/day: 1.00    Years: 20.00    Pack years: 20.00    Types: Cigarettes    Start date: 03/16/1964    Quit date: 10/07/1997    Years since  quitting: 22.5  . Smokeless tobacco: Never Used  Substance and Sexual Activity  . Alcohol use: No    Alcohol/week: 0.0 standard drinks  . Drug use: No  . Sexual activity: Yes    Partners: Male  Other Topics Concern  . Not on file  Social History Narrative  . Not on file   Social Determinants of Health   Financial Resource Strain:   . Difficulty of Paying Living Expenses:   Food Insecurity:   . Worried About Charity fundraiser in the Last Year:   . Arboriculturist in the Last Year:   Transportation Needs:   . Film/video editor (Medical):   Marland Kitchen Lack of Transportation (Non-Medical):   Physical Activity:   . Days of Exercise per Week:   . Minutes of Exercise per Session:   Stress:   . Feeling of Stress :   Social Connections:   . Frequency of Communication with Friends and Family:   . Frequency of Social Gatherings with Friends and Family:   . Attends Religious Services:   . Active Member of Clubs or Organizations:   . Attends Archivist Meetings:   Marland Kitchen Marital Status:   Intimate Partner Violence:   . Fear of Current or Ex-Partner:   . Emotionally Abused:   Marland Kitchen Physically Abused:   . Sexually Abused:      Family History:    Family History  Problem Relation Age of Onset  . Stroke Mother   . Dementia Father   . Cancer - Other Sister     Barbarann Ehlers, RN was present throughout the entirety of the encounter.  Review of Systems    General:  No chills, fever, night sweats or weight changes.  Cardiovascular:  No chest pain, edema, orthopnea, paroxysmal nocturnal dyspnea. Positive for palpitations and dyspnea on exertion.  Dermatological: No rash, lesions/masses Respiratory: No cough, dyspnea Urologic: No hematuria, dysuria Abdominal:   No nausea, vomiting, diarrhea, bright red blood per rectum, melena, or hematemesis Neurologic:  No visual changes, wkns, changes in mental status. All other systems reviewed and are otherwise negative except as noted  above.  Physical Exam/Data    Vitals:   05/05/20 0600 05/05/20 0700 05/05/20 4782  05/05/20 0729  BP: (!) 139/50 (!) 137/47    Pulse: (!) 57 (!) 58 (!) 56 (!) 57  Resp: 16 16 15 15   Temp:    98.1 F (36.7 C)  TempSrc:    Oral  SpO2: 98% 98% 99% 100%  Weight:      Height:        Intake/Output Summary (Last 24 hours) at 05/05/2020 0847 Last data filed at 05/05/2020 0315 Gross per 24 hour  Intake 84.07 ml  Output --  Net 84.07 ml   Filed Weights   05/04/20 2143 05/05/20 0100  Weight: 79.4 kg 79.3 kg   Body mass index is 31.98 kg/m.   General: Pleasant female appearing in NAD Psych: Normal affect. Neuro: Alert and oriented X 3. Moves all extremities spontaneously. HEENT: Normal  Neck: Supple without bruits or JVD. Lungs:  Resp regular and unlabored, CTA without wheezing or rales. Heart: RRR no s3, s4, or murmurs. Abdomen: Soft, non-tender, non-distended, BS + x 4.  Extremities: No clubbing, cyanosis or edema. DP/PT/Radials 2+ and equal bilaterally.   EKG:  The EKG was personally reviewed and demonstrates: Atrial fibrillation with RVR, HR 128 and LVH with repol.   Telemetry: As above.    Labs/Studies     Relevant CV Studies:  Stress Echocardiogram: 2014-02-10 Study Conclusions   - Stress: Functional capacity was excellent, >150% of  predicted based on age and gender.  - Stress ECG conclusions: The stress ECG was equivocal,  baseline ST/T changes became more prominent with stress.  This may be secondary to cavity obliteration or  hypertensive response in the setting of underlying LVH.  - Baseline: LV global systolic function was normal. There is  baseline LVH and mitral anular calcification.  - Peak stress: LV global systolic function was normal. There  was hyperdynamic response of all wall segments with cavity  obliteration.  Impressions:   - Normal study after maximal exercise.   Laboratory Data:  Chemistry Recent Labs  Lab 04/28/20 1151  05/04/20 2149 05/05/20 0447  NA 133* 136 139  K 3.3* 3.2* 3.4*  CL 94* 98 97*  CO2 27 28 31   GLUCOSE 109* 119* 112*  BUN 19 18 17   CREATININE 1.33* 1.29* 1.36*  CALCIUM 9.1 9.4 9.1  GFRNONAA 39* 41* 38*  GFRAA 46* 47* 44*  ANIONGAP 12 10 11     Recent Labs  Lab 05/04/20 2149 05/05/20 0447  PROT 8.0 6.9  ALBUMIN 4.5 4.0  AST 22 18  ALT 14 13  ALKPHOS 80 61  BILITOT 0.5 0.8   Hematology Recent Labs  Lab 04/28/20 1151 05/04/20 2149 05/05/20 0447  WBC 6.4 7.9 7.8  RBC 5.42* 5.42* 4.86  HGB 15.0 15.0 13.5  HCT 47.4* 46.9* 42.9  MCV 87.5 86.5 88.3  MCH 27.7 27.7 27.8  MCHC 31.6 32.0 31.5  RDW 13.6 13.6 13.6  PLT 300 311 286   Cardiac EnzymesNo results for input(s): TROPONINI in the last 168 hours. No results for input(s): TROPIPOC in the last 168 hours.  BNPNo results for input(s): BNP, PROBNP in the last 168 hours.  DDimer No results for input(s): DDIMER in the last 168 hours.  Radiology/Studies:  DG Chest Port 1 View  Result Date: 05/04/2020 CLINICAL DATA:  Shortness of breath EXAM: PORTABLE CHEST 1 VIEW COMPARISON:  04/28/2020 FINDINGS: The heart size and mediastinal contours are within normal limits. Both lungs are clear. The visualized skeletal structures are unremarkable. IMPRESSION: No active disease. Electronically Signed   By: Lennette Bihari  [place] M.D.   On: 05/04/2020 22:23     Assessment & Plan    1. Paroxysmal Atrial Fibrillation - initial episode occurred on 04/28/2020 and she converted back to NSR with IV Cardizem. Presented back to the ED on 05/04/2020 for evaluation of dyspnea and chest discomfort, found to be in atrial fibrillation with RVR and again converted with IV Cardizem.   - K+ 3.2 on admission and has been replaced. Mg 2.1. Initial HS Troponin 28 with repeat of 35. Will check TSH. She was scheduled for an outpatient echocardiogram so will cancel this as imaging is to be obtained this admission.  - given baseline HR in the 50's, unable to further  titrate her Toprol-XL 25mg  daily. Will review with Dr. Bronson Ing in regards to using short-acting PRN Cardizem 30mg  as an outpatient if she were to develop recurrent symptoms. She would benefit from EP evaluation to discuss antiarrhythmic options. No known history of CAD but last ischemic evaluation was a stress echo in 12/2013 so unsure if Flecainide could be considered.  - This patients CHA2DS2-VASc Score and unadjusted Ischemic Stroke Rate (% per year) is equal to at least 3.2 % stroke rate/year from a score of 3 (HTN, Female, Age). She has been continued on Eliquis 5mg  BID for anticoagulation.   2. HTN - BP has been at 119/47 - 143/96 since admission, at 128/68 on most recent check. She has been continued on PTA Toprol-XL 25mg  daily and Maxzide 75-50mg  daily.   3. Hypokalemia - suspect this is secondary to dual diuretic therapy as she was on HCTZ and Lasix prior to admission and developed worsening fluid accumulation when Lasix was discontinued in the past. She is on K-dur 20 mEq TID as an outpatient and we recently reduced her Lasix to 20mg  every 3rd day.   4. HLD - followed by PCP as an outpatient. On Simvastatin 40mg  daily PTA. If switched to daily Cardizem in the future, would use a different statin.    For questions or updates, please contact [place] Please consult [email or web address] for contact info under Cardiology/STEMI.  Signed, Erma Heritage, PA-C 05/05/2020, 8:47 AM Pager: [phone]  The patient was seen and examined, and I agree with the history, physical exam, assessment and plan as documented above, with modifications made above and as noted below. I have also personally reviewed all relevant documentation, old records, labs, and both radiographic and cardiovascular studies. I have also independently interpreted old and new ECG's.  Briefly, this is a 74 year old woman with a history of paroxysmal atrial fibrillation which initially occurred on 04/28/2020 along with  hypertension, hyperlipidemia, and vasovagal syncope.  She was seen in the ED on 04/28/2020 for new onset rapid atrial fibrillation and was hypokalemic at that time.  She was started on IV diltiazem and converted to sinus rhythm.  At that time she was started on Eliquis.  She then saw the Delano Metz in our office on 05/03/2020 and was doing well at that time.  She again presented to the ED on 05/04/2020 for dyspnea and chest discomfort and found to be in rapid atrial fibrillation.  She was noted to be hypokalemic with a potassium of 3.2.  Troponins were nonspecifically elevated.  Chest x-ray was normal.  I personally reviewed the ECG which demonstrated rapid atrial fibrillation, 120 bpm, with LVH and consequent repolarization abnormalities.  She was again started on IV diltiazem and converted to sinus rhythm overnight has been maintaining sinus bradycardia to sinus rhythm with a heart rate  in the 50-60 bpm range with occasional PVCs.  At the time my evaluation she is doing well and denies chest pain, palpitations, leg swelling, and shortness of breath.  An echocardiogram has been ordered and is pending.  Recommendations: Currently on Toprol-XL 25 mg daily and unable to titrate further given resting heart rate in the 50-60 bpm range.  I would recommend as needed diltiazem 30 mg for tachycardia/palpitations.  She will need an outpatient ischemic work-up to determine if a class Ic antiarrhythmic such as flecainide or propafenone can be used.  Continue systemic anticoagulation with apixaban 5 mg twice daily.  We will arrange for outpatient EP evaluation.     Kate Sable, MD, [facility]  05/05/2020 10:11 AM

## 2020-05-05 NOTE — Progress Notes (Signed)
Patient is to be discharged home and in stable condition. IV and telemetry removed, WNL Patient given discharge instructions and verbalized understanding of instructions, including the need for follow-up appointments and changes to her medication regimen. All questions addressed and answered. Patient escorted out by staff via wheelchair to an awaiting vehicle.  Celestia Khat, RN

## 2020-05-05 NOTE — Progress Notes (Signed)
  Echocardiogram 2D Echocardiogram has been performed.  [name] [name] 05/05/2020, 11:38 AM

## 2020-05-05 NOTE — H&P (Signed)
History and Physical    [name] [name] QJF:[id number] DOB: [birth year] DOA: 05/04/2020  PCP: Andres Shad, MD  Patient coming from: Home.  I have personally briefly reviewed patient's old medical records in [place]  Chief Complaint: Chest pain.  HPI: [name] [name] is a 74 y.o. female with medical history significant of GERD, gout, hyperlipidemia, hypertension, hypokalemia, paroxysmal atrial fibrillation, PUD, neurocardiogenic syncopal episode who is coming to the emergency department with complaints of on and off chest pain since earlier in the evening associated with dyspnea and palpitations.  She had a similar episode 6 days ago and was diagnosed with atrial fibrillation.  She was seen by cardiology yesterday. She denies diaphoresis, dizziness, nausea, emesis, PND, orthopnea or recent lower extremity edema.  She denies headache, fever, chills, sore throat, rhinorrhea, wheezing or hemoptysis.  Denies abdominal pain, diarrhea, constipation, melena or hematochezia.  No dysuria, frequency or hematuria.  No polyuria, polydipsia, polyphagia or blurred vision.  ED Course: Initial vital signs temperature 98 F, pulse 144, respirations 18, BP 132/96 mmHg and O2 sat 96% on room air.  The patient was given 10 mg of Cardizem and IVP and was started on a Cardizem continuous infusion.  I added magnesium sulfate 2 g IVPB and 40 mEq of potassium p.o. x1.  Her CBC showed a white count 7.9, hemoglobin 15.0 g/dL and platelets 311.  Magnesium was 2.1 mg/dL.  EKG was atrial fibrillation with RVR 129 bpm.  Troponin was 20 and then 35 ng/L.  SARS coronavirus 2 was negative.  CMP shows a potassium of 3.2 mmol/L.  Glucose of 119 and creatinine 1.29 mg/dL.  The rest of the CMP measurements are within expected values.  Her chest radiograph did not have any acute cardiopulmonary pathology.  Review of Systems: As per HPI otherwise all other systems reviewed and are negative.  Past Medical History:    Diagnosis Date  . GERD (gastroesophageal reflux disease)   . Gout   . Hyperlipidemia   . Hypertension   . Hypokalemia   . PAF (paroxysmal atrial fibrillation) ([place])   . PUD (peptic ulcer disease)   . Syncope    Neurocardiogenic    Past Surgical History:  Procedure Laterality Date  . ABDOMINAL HYSTERECTOMY    . CATARACT EXTRACTION Bilateral   . COLONOSCOPY N/A 10/07/2014   Dr. Oneida Alar: redundant left colon, moderate sized external hemorrhoids   . COLONOSCOPY N/A 03/11/2020   Procedure: COLONOSCOPY;  Surgeon: Danie Binder, MD;  [facility];  Service: Endoscopy;  Laterality: N/A;  9:30am w/ overtube  . LIPOMA RESECTION    . POLYPECTOMY  03/11/2020   Procedure: POLYPECTOMY;  Surgeon: Danie Binder, MD;  Location: [facility];  Service: Endoscopy;;  cecal, transverse, descending, sigmoid    Social History  reports that she quit smoking about 22 years ago. Her smoking use included cigarettes. She started smoking about 56 years ago. She has a 20.00 pack-year smoking history. She has never used smokeless tobacco. She reports that she does not drink alcohol or use drugs.  Allergies  Allergen Reactions  . Acetaminophen-Codeine Hives  . Allopurinol Hives  . Amoxicillin Hives    Did it involve swelling of the face/tongue/throat, SOB, or low BP? No Did it involve sudden or severe rash/hives, skin peeling, or any reaction on the inside of your mouth or nose? No Did you need to seek medical attention at a hospital or doctor's office? No When did it last happen?10 + years  If all above answers are "NO", may proceed with cephalosporin use.   . Tramadol Hives    Family History  Problem Relation Age of Onset  . Stroke Mother   . Dementia Father   . Cancer - Other Sister    Prior to Admission medications   Medication Sig Start Date End Date Taking? Authorizing Provider  acetaminophen (TYLENOL) 500 MG tablet Take 1,000 mg by mouth every 6 (six) hours as needed for  moderate pain.    [provider]  albuterol (VENTOLIN HFA) 108 (90 Base) MCG/ACT inhaler  04/28/20   [provider]  apixaban (ELIQUIS) 5 MG TABS tablet Take 1 tablet (5 mg total) by mouth 2 (two) times daily. 05/03/20 04/28/21  Strader, Fransisco Hertz, PA-C  APPLE CIDER VINEGAR PO Take 2 capsules by mouth daily.    [provider]  cetirizine (ZYRTEC) 10 MG tablet Take 10 mg by mouth daily as needed for allergies.    [provider]  colchicine 0.6 MG tablet Take 0.6 mg by mouth daily. 01/31/20   [provider]  diclofenac Sodium (VOLTAREN) 1 % GEL Apply 1 application topically 4 (four) times daily as needed (pain).    [provider]  ELDERBERRY PO Take 2 capsules by mouth daily.    [provider]  fluticasone (FLONASE) 50 MCG/ACT nasal spray Place 1 spray into both nostrils daily as needed for allergies or rhinitis.    [provider]  Fluticasone-Salmeterol (ADVAIR) 500-50 MCG/DOSE AEPB Inhale 1 puff into the lungs every 12 (twelve) hours.    [provider]  furosemide (LASIX) 20 MG tablet Take 20 mg by mouth every other day.     [provider]  hydrocortisone (PROCTOSOL HC) 2.5 % rectal cream PLACE RECTALLY 2 (TWO) TIMES DAILY. NO MORE THAN 14 DAYS AT A TIME. Patient taking differently: Place 1 application rectally 2 (two) times daily as needed for hemorrhoids.  12/16/18   Mahala Menghini, PA-C  lubiprostone (AMITIZA) 24 MCG capsule TAKE 1 CAPSULE TWICE DAILY WITH A MEAL Patient taking differently: Take 24 mcg by mouth in the morning and at bedtime.  08/11/19   Mahala Menghini, PA-C  meclizine (ANTIVERT) 25 MG tablet Take 25 mg by mouth 3 (three) times daily as needed for dizziness.     [provider]  metoprolol succinate (TOPROL-XL) 25 MG 24 hr tablet TAKE 1 TABLET EVERY DAY Patient taking differently: Take 25 mg by mouth daily.  01/08/17   Satira Sark, MD  omeprazole (PRILOSEC) 40 MG capsule  Take 40 mg by mouth daily.      [provider]  potassium chloride SA (KLOR-CON) 20 MEQ tablet Take 20 mEq by mouth in the morning, at noon, and at bedtime.    [provider]  simvastatin (ZOCOR) 40 MG tablet Take 40 mg by mouth daily in the afternoon.     [provider]  triamterene-hydrochlorothiazide (MAXZIDE) 75-50 MG per tablet Take 1 tablet by mouth daily.    [provider]  vitamin C (ASCORBIC ACID) 250 MG tablet Take 500 mg by mouth daily.    [provider]    Physical Exam: Vitals:   05/04/20 2300 05/04/20 2337 05/05/20 0017 05/05/20 0030  BP: (!) 143/75 132/69  (!) 142/74  Pulse: 62 67 (!) 103 80  Resp: 17 19 (!) 25 15  Temp:      TempSrc:      SpO2: 95% 98% 99% 99%  Weight:  Height:        Constitutional: NAD, calm, comfortable Eyes: PERRL, lids and conjunctivae normal ENMT: Mucous membranes are moist. Posterior pharynx clear of any exudate or lesions. Neck: normal, supple, no masses, no thyromegaly Respiratory: clear to auscultation bilaterally, no wheezing, no crackles. Normal respiratory effort. No accessory muscle use.  Cardiovascular: S1-S2, irregularly irregular, tachycardic in the 110s, no murmurs / rubs / gallops. No extremity edema. 2+ pedal pulses. No carotid bruits.  Abdomen: Nondistended.  BS positive.  Soft, no tenderness, no masses palpated. No hepatosplenomegaly. Musculoskeletal: no clubbing / cyanosis.  Good ROM, no contractures. Normal muscle tone.  Skin: no rashes, lesions, ulcers on very limited dermatological examination. Neurologic: CN 2-12 grossly intact. Sensation intact, DTR normal. Strength 5/5 in all 4.  Psychiatric: Normal judgment and insight. Alert and oriented x 3. Normal mood.   Labs on Admission: I have personally reviewed following labs and imaging studies  CBC: Recent Labs  Lab 04/28/20 1151 05/04/20 2149  WBC 6.4 7.9  NEUTROABS  --  3.8  HGB 15.0 15.0  HCT 47.4* 46.9*  MCV  87.5 86.5  PLT 300 299    Basic Metabolic Panel: Recent Labs  Lab 04/28/20 1151 05/04/20 2149  NA 133* 136  K 3.3* 3.2*  CL 94* 98  CO2 27 28  GLUCOSE 109* 119*  BUN 19 18  CREATININE 1.33* 1.29*  CALCIUM 9.1 9.4  MG  --  2.1    GFR: Estimated Creatinine Clearance: 37.3 mL/min (A) (by C-G formula based on SCr of 1.29 mg/dL (H)).  Liver Function Tests: Recent Labs  Lab 05/04/20 2149  AST 22  ALT 14  ALKPHOS 80  BILITOT 0.5  PROT 8.0  ALBUMIN 4.5   Radiological Exams on Admission: DG Chest Port 1 View  Result Date: 05/04/2020 CLINICAL DATA:  Shortness of breath EXAM: PORTABLE CHEST 1 VIEW COMPARISON:  04/28/2020 FINDINGS: The heart size and mediastinal contours are within normal limits. Both lungs are clear. The visualized skeletal structures are unremarkable. IMPRESSION: No active disease. Electronically Signed   By: Ulyses Jarred M.D.   On: 05/04/2020 22:23    EKG: Independently reviewed. Vent. rate 128 BPM PR interval * ms QRS duration 108 ms QT/QTc 285/416 ms P-R-T axes * 19 137 Atrial fibrillation LVH with secondary repolarization abnormality  Assessment/Plan Principal Problem:   Paroxysmal atrial fibrillation with RVR (HCC) Observation/stepdown. Supplemental oxygen as needed. Continue Cardizem infusion. Continue metoprolol ER 25 mg p.o. daily. Keep electrolytes optimized. Obtain echocardiogram in the morning. Routine cardiology consult in a.m.  Active Problems:   Essential hypertension, benign Continue metoprolol 25 mg p.o. daily. Monitor blood pressure and heart rate.    Constipation Continue Amitiza 24 mg p.o. twice daily.    GERD (gastroesophageal reflux disease) Continue PPI.    Hyperlipidemia Continue simvastatin 40 mg p.o. daily.    Gout Continue colchicine 0.6 mg p.o. daily.   DVT prophylaxis: On apixaban. Code Status:   Full code. Family Communication: Disposition Plan:   Patient is from:  Home.  Anticipated DC  to:  Home.  Anticipated DC date:  05/05/2020 OR 05/06/2020.  Anticipated DC barriers: Clinical improvement. Consults called:  Routine cardiology consult. Admission status:  Stepdown/observation.  Severity of Illness: Medium severity.  Reubin Milan MD [facility]  How to contact the [facility] Attending or Consulting provider [place] or covering provider during after hours [place], for this patient?   1. Check the care team in [facility] and look for a)  attending/consulting [facility] provider listed and b) the [facility] team listed 2. Log into [email or web address] and use [place]'s universal password to access. If you do not have the password, please contact the hospital operator. 3. Locate the [facility] provider you are looking for under [facility] and page to a number that you can be directly reached. 4. If you still have difficulty reaching the provider, please page the [facility] (Director on Call) for the Hospitalists listed on amion for assistance.  05/05/2020, 12:57 AM   Document was prepared using Dragon voice recognition software and may contain some unintended transcription errors.

## 2020-05-06 ENCOUNTER — Other Ambulatory Visit: Payer: Self-pay

## 2020-05-06 MED ORDER — METOPROLOL SUCCINATE ER 25 MG PO TB24: 25.0000 mg | ORAL_TABLET | Freq: Every day | ORAL | 3 refills | Status: DC

## 2020-05-06 NOTE — Telephone Encounter (Signed)
Pt need mail in prescription updated. Pt is asking if she can use metoprolol from 2020. If not; will you Please call in to [place], [place].  Please call [phone]   Thanks renee

## 2020-05-06 NOTE — Telephone Encounter (Signed)
Refilled Toprol XL 25 mg #90 to requested pharmacy

## 2020-05-16 ENCOUNTER — Other Ambulatory Visit (HOSPITAL_COMMUNITY): Payer: Medicare HMO

## 2020-05-27 ENCOUNTER — Other Ambulatory Visit: Payer: Self-pay

## 2020-05-27 ENCOUNTER — Encounter: Payer: Self-pay | Admitting: Internal Medicine

## 2020-05-27 ENCOUNTER — Ambulatory Visit: Payer: Medicare HMO | Admitting: Internal Medicine

## 2020-05-27 VITALS — BP 172/82 | HR 62 | Ht 62.0 in | Wt 178.4 lb

## 2020-05-27 DIAGNOSIS — I10 Essential (primary) hypertension: Secondary | ICD-10-CM | POA: Diagnosis not present

## 2020-05-27 DIAGNOSIS — I480 Paroxysmal atrial fibrillation: Secondary | ICD-10-CM | POA: Diagnosis not present

## 2020-05-27 DIAGNOSIS — R0683 Snoring: Secondary | ICD-10-CM

## 2020-05-27 DIAGNOSIS — Z79899 Other long term (current) drug therapy: Secondary | ICD-10-CM | POA: Diagnosis not present

## 2020-05-27 DIAGNOSIS — R400 Somnolence: Secondary | ICD-10-CM | POA: Diagnosis not present

## 2020-05-27 MED ORDER — FUROSEMIDE 40 MG PO TABS
40.0000 mg | ORAL_TABLET | Freq: Every day | ORAL | 3 refills | Status: DC
Start: 2020-05-27 — End: 2020-10-25

## 2020-05-27 MED ORDER — FLECAINIDE ACETATE 150 MG PO TABS
75.0000 mg | ORAL_TABLET | Freq: Two times a day (BID) | ORAL | 3 refills | Status: DC
Start: 2020-05-27 — End: 2020-10-10

## 2020-05-27 NOTE — Patient Instructions (Addendum)
Medication Instructions:  Take Lasix 40 mg daily    START Flecainide 150 mg, take 1/2 tablet (75 mg ) twice a day  *If you need a refill on your cardiac medications before your next appointment, please call your pharmacy*   Lab Work: BMET in 2 weeks   If you have labs (blood work) drawn today and your tests are completely normal, you will receive your results only by: Marland Kitchen MyChart Message (if you have MyChart) OR . A paper copy in the mail If you have any lab test that is abnormal or we need to change your treatment, we will call you to review the results.   Testing/Procedures: mone today   Follow-Up: At [facility], you and your health needs are our priority.  As part of our continuing mission to provide you with exceptional heart care, we have created designated Provider Care Teams.  These Care Teams include your primary Cardiologist (physician) and Advanced Practice Providers (APPs -  Physician Assistants and Nurse Practitioners) who all work together to provide you with the care you need, when you need it.  We recommend signing up for the patient portal called "MyChart".  Sign up information is provided on this After Visit Summary.  MyChart is used to connect with patients for Virtual Visits (Telemedicine).  Patients are able to view lab/test results, encounter notes, upcoming appointments, etc.  Non-urgent messages can be sent to your provider as well.   To learn more about what you can do with MyChart, go to [email or web address].    Your next appointment:   3 month(s)  The format for your next appointment:   In Person  Provider:   Cristopher Peru, MD   Other Instructions Nurse apt in 2 weeks for EKG   We have referred you to [facility].They will call you to schedule an apt.    Thank you for choosing [facility] !

## 2020-05-27 NOTE — Progress Notes (Signed)
HPI [name] [name] is referred by Bernerd Pho for evaluation of PAF. She is an obese, HTN, woman with recurrent atrial fib with a RVR. She gets sob and chest pressure when she is out of rhythm. She notes that she has been more tired lately. No chest pain. No edema. No syncope. Her BP has not been well controlled. She denies missing her eliquis.  Allergies  Allergen Reactions  . Acetaminophen-Codeine Hives  . Allopurinol Hives  . Amoxicillin Hives    Did it involve swelling of the face/tongue/throat, SOB, or low BP? No Did it involve sudden or severe rash/hives, skin peeling, or any reaction on the inside of your mouth or nose? No Did you need to seek medical attention at a hospital or doctor's office? No When did it last happen?10 + years If all above answers are "NO", may proceed with cephalosporin use.   . Tramadol Hives     Current Outpatient Medications  Medication Sig Dispense Refill  . acetaminophen (TYLENOL) 500 MG tablet Take 1,000 mg by mouth every 6 (six) hours as needed for moderate pain.    Marland Kitchen apixaban (ELIQUIS) 5 MG TABS tablet Take 1 tablet (5 mg total) by mouth 2 (two) times daily. 60 tablet 11  . APPLE CIDER VINEGAR PO Take 2 capsules by mouth daily.    . cetirizine (ZYRTEC) 10 MG tablet Take 10 mg by mouth daily as needed for allergies.    Marland Kitchen colchicine 0.6 MG tablet Take 0.6 mg by mouth daily.    . cyproheptadine (PERIACTIN) 4 MG tablet Take 4 mg by mouth 3 (three) times daily as needed for allergies.    Marland Kitchen diclofenac Sodium (VOLTAREN) 1 % GEL Apply 1 application topically 4 (four) times daily as needed (pain).    Marland Kitchen diltiazem (CARDIZEM) 30 MG tablet Take 1 tablet (30 mg total) by mouth daily as needed (palpitations). 30 tablet 1  . ELDERBERRY PO Take 2 capsules by mouth daily.    . fluticasone (FLONASE) 50 MCG/ACT nasal spray Place 1 spray into both nostrils daily as needed for allergies or rhinitis.    . furosemide (LASIX) 20 MG tablet Take 1 tablet (20 mg  total) by mouth every 3 (three) days. 30 tablet   . hydrocortisone (PROCTOSOL HC) 2.5 % rectal cream PLACE RECTALLY 2 (TWO) TIMES DAILY. NO MORE THAN 14 DAYS AT A TIME. (Patient taking differently: Place 1 application rectally 2 (two) times daily as needed for hemorrhoids. ) 28.35 g 3  . lubiprostone (AMITIZA) 24 MCG capsule TAKE 1 CAPSULE TWICE DAILY WITH A MEAL (Patient taking differently: Take 24 mcg by mouth in the morning and at bedtime. ) 180 capsule 3  . meclizine (ANTIVERT) 25 MG tablet Take 25 mg by mouth 3 (three) times daily as needed for dizziness.     . metoprolol tartrate (LOPRESSOR) 25 MG tablet Take 25 mg by mouth 2 (two) times daily.    Marland Kitchen omeprazole (PRILOSEC) 40 MG capsule Take 40 mg by mouth daily.      . potassium chloride (KLOR-CON) 10 MEQ tablet     . potassium chloride SA (KLOR-CON) 20 MEQ tablet Take 20 mEq by mouth in the morning, at noon, and at bedtime.    . simvastatin (ZOCOR) 40 MG tablet Take 40 mg by mouth daily in the afternoon.     . triamterene-hydrochlorothiazide (MAXZIDE) 75-50 MG per tablet Take 1 tablet by mouth daily.    . vitamin C (ASCORBIC ACID) 250 MG tablet  Take 500 mg by mouth daily.    Marland Kitchen albuterol (VENTOLIN HFA) 108 (90 Base) MCG/ACT inhaler     . amLODipine (NORVASC) 2.5 MG tablet Take 2.5 mg by mouth daily.    . Fluticasone-Salmeterol (ADVAIR) 500-50 MCG/DOSE AEPB Inhale 1 puff into the lungs every 12 (twelve) hours.    Manus Gunning BOWEL PREP KIT 17.5-3.13-1.6 GM/177ML SOLN SMARTSIG:354 Milliliter(s) By Mouth As Directed     No current facility-administered medications for this visit.     Past Medical History:  Diagnosis Date  . GERD (gastroesophageal reflux disease)   . Gout   . Hyperlipidemia   . Hypertension   . Hypokalemia   . PAF (paroxysmal atrial fibrillation) ([place])   . PUD (peptic ulcer disease)   . Syncope    Neurocardiogenic    ROS:   All systems reviewed and negative except as noted in the HPI.   Past Surgical History:    Procedure Laterality Date  . ABDOMINAL HYSTERECTOMY    . CATARACT EXTRACTION Bilateral   . COLONOSCOPY N/A 10/07/2014   Dr. Oneida Alar: redundant left colon, moderate sized external hemorrhoids   . COLONOSCOPY N/A 03/11/2020   Procedure: COLONOSCOPY;  Surgeon: Danie Binder, MD;  [facility];  Service: Endoscopy;  Laterality: N/A;  9:30am w/ overtube  . LIPOMA RESECTION    . POLYPECTOMY  03/11/2020   Procedure: POLYPECTOMY;  Surgeon: Danie Binder, MD;  [facility];  Service: Endoscopy;;  cecal, transverse, descending, sigmoid     Family History  Problem Relation Age of Onset  . Stroke Mother   . Dementia Father   . Cancer - Other Sister      Social History   Socioeconomic History  . Marital status: Single    Spouse name: Not on file  . Number of children: Not on file  . Years of education: Not on file  . Highest education level: Not on file  Occupational History  . Occupation: Retired  Tobacco Use  . Smoking status: Former Smoker    Packs/day: 1.00    Years: 20.00    Pack years: 20.00    Types: Cigarettes    Start date: 03/16/1964    Quit date: 10/07/1997    Years since quitting: 22.6  . Smokeless tobacco: Never Used  Substance and Sexual Activity  . Alcohol use: No    Alcohol/week: 0.0 standard drinks  . Drug use: No  . Sexual activity: Yes    Partners: Male  Other Topics Concern  . Not on file  Social History Narrative  . Not on file   Social Determinants of Health   Financial Resource Strain:   . Difficulty of Paying Living Expenses:   Food Insecurity:   . Worried About Charity fundraiser in the Last Year:   . Arboriculturist in the Last Year:   Transportation Needs:   . Film/video editor (Medical):   Marland Kitchen Lack of Transportation (Non-Medical):   Physical Activity:   . Days of Exercise per Week:   . Minutes of Exercise per Session:   Stress:   . Feeling of Stress :   Social Connections:   . Frequency of Communication  with Friends and Family:   . Frequency of Social Gatherings with Friends and Family:   . Attends Religious Services:   . Active Member of Clubs or Organizations:   . Attends Archivist Meetings:   Marland Kitchen Marital Status:   Intimate Partner Violence:   .  Fear of Current or Ex-Partner:   . Emotionally Abused:   Marland Kitchen Physically Abused:   . Sexually Abused:      BP (!) 172/82   Pulse 62   Ht _0  (1.575 m)   Wt 178 lb 6.4 oz (80.9 kg)   SpO2 99%   BMI 32.63 kg/m   Physical Exam:  Well appearing 74 yo woman, NAD HEENT: Unremarkable Neck:  6 cm JVD, no thyromegally Lymphatics:  No adenopathy Back:  No CVA tenderness Lungs:  Clear with no wheezes HEART:  Regular rate rhythm, no murmurs, no rubs, no clicks Abd:  soft, positive bowel sounds, no organomegally, no rebound, no guarding Ext:  2 plus pulses, no edema, no cyanosis, no clubbing Skin:  No rashes no nodules Neuro:  CN II through XII intact, motor grossly intact  EKG -none   Assess/Plan: 1. PAF - she is very symptomatic. We discussed the treatment options and I have recommended she start flecainide 75 mg twice daily. She will continue her beta blocker and return in 2 weeks for an ECG. She will likely need a stress test.  2. Snoring - I suspect that she has sleep apnea. She is tired during the day. I have asked her to get a sleep study. 3. Obesity - I asked her to lose weight. 4. HTN -her bp is not well controlled. We will consider switching to coreg when I see her back.   Mikle Bosworth.D.

## 2020-06-06 ENCOUNTER — Telehealth: Payer: Self-pay

## 2020-06-06 NOTE — Telephone Encounter (Signed)
Pt is questioning her Kt levels   Please call [phone]   Thanks renee

## 2020-06-06 NOTE — Telephone Encounter (Signed)
Confirmed with patient that she will have K+ drawn and then nurse apt for EKG on 6/18

## 2020-06-10 ENCOUNTER — Telehealth: Payer: Self-pay

## 2020-06-10 ENCOUNTER — Emergency Department (HOSPITAL_COMMUNITY)
Admission: EM | Admit: 2020-06-10 | Discharge: 2020-06-10 | Disposition: A | Discharge status: Home / Self Care | Payer: Medicare HMO | Attending: Emergency Medicine | Admitting: Emergency Medicine

## 2020-06-10 ENCOUNTER — Ambulatory Visit: Payer: Medicare HMO

## 2020-06-10 ENCOUNTER — Other Ambulatory Visit: Payer: Self-pay

## 2020-06-10 DIAGNOSIS — I10 Essential (primary) hypertension: Secondary | ICD-10-CM | POA: Diagnosis not present

## 2020-06-10 DIAGNOSIS — Z885 Allergy status to narcotic agent status: Secondary | ICD-10-CM | POA: Diagnosis not present

## 2020-06-10 DIAGNOSIS — Z87891 Personal history of nicotine dependence: Secondary | ICD-10-CM | POA: Diagnosis not present

## 2020-06-10 DIAGNOSIS — E860 Dehydration: Secondary | ICD-10-CM

## 2020-06-10 DIAGNOSIS — R55 Syncope and collapse: Secondary | ICD-10-CM | POA: Diagnosis not present

## 2020-06-10 DIAGNOSIS — Z79899 Other long term (current) drug therapy: Secondary | ICD-10-CM | POA: Insufficient documentation

## 2020-06-10 DIAGNOSIS — Z880 Allergy status to penicillin: Secondary | ICD-10-CM | POA: Insufficient documentation

## 2020-06-10 DIAGNOSIS — R0602 Shortness of breath: Secondary | ICD-10-CM | POA: Diagnosis present

## 2020-06-10 LAB — BASIC METABOLIC PANEL
Anion gap: 12 (ref 5–15)
BUN: 20 mg/dL (ref 8–23)
CO2: 30 mmol/L (ref 22–32)
Calcium: 9 mg/dL (ref 8.9–10.3)
Chloride: 87 mmol/L — ABNORMAL LOW (ref 98–111)
Creatinine, Ser: 1.43 mg/dL — ABNORMAL HIGH (ref 0.44–1.00)
GFR calc Af Amer: 42 mL/min — ABNORMAL LOW (ref >60)
GFR calc non Af Amer: 36 mL/min — ABNORMAL LOW (ref >60)
Glucose, Bld: 120 mg/dL — ABNORMAL HIGH (ref 70–99)
Potassium: 3.8 mmol/L (ref 3.5–5.1)
Sodium: 129 mmol/L — ABNORMAL LOW (ref 135–145)

## 2020-06-10 LAB — CBC WITH DIFFERENTIAL/PLATELET
Abs Immature Granulocytes: 0.01 10*3/uL (ref 0.00–0.07)
Basophils Absolute: 0 10*3/uL (ref 0.0–0.1)
Basophils Relative: 0 %
Eosinophils Absolute: 0.3 10*3/uL (ref 0.0–0.5)
Eosinophils Relative: 4 %
HCT: 39 % (ref 36.0–46.0)
Hemoglobin: 12.1 g/dL (ref 12.0–15.0)
Immature Granulocytes: 0 %
Lymphocytes Relative: 49 %
Lymphs Abs: 3.2 10*3/uL (ref 0.7–4.0)
MCH: 27.4 pg (ref 26.0–34.0)
MCHC: 31 g/dL (ref 30.0–36.0)
MCV: 88.2 fL (ref 80.0–100.0)
Monocytes Absolute: 0.5 10*3/uL (ref 0.1–1.0)
Monocytes Relative: 8 %
Neutro Abs: 2.6 10*3/uL (ref 1.7–7.7)
Neutrophils Relative %: 39 %
Platelets: 363 10*3/uL (ref 150–400)
RBC: 4.42 MIL/uL (ref 3.87–5.11)
RDW: 13.8 % (ref 11.5–15.5)
WBC: 6.6 10*3/uL (ref 4.0–10.5)
nRBC: 0 % (ref 0.0–0.2)

## 2020-06-10 LAB — TROPONIN I (HIGH SENSITIVITY): Troponin I (High Sensitivity): 18 ng/L — ABNORMAL HIGH (ref <18)

## 2020-06-10 LAB — BRAIN NATRIURETIC PEPTIDE: B Natriuretic Peptide: 198 pg/mL — ABNORMAL HIGH (ref 0.0–100.0)

## 2020-06-10 MED ORDER — SODIUM CHLORIDE 0.9 % IV BOLUS (SEPSIS)
1000.0000 mL | Freq: Once | INTRAVENOUS | Status: AC
  Administered 2020-06-10: 1000 mL via INTRAVENOUS

## 2020-06-10 NOTE — ED Triage Notes (Signed)
Pt states she was getting ready for bed, had a sudden hot feeling with dizziness.  Pt states she was able to sit down on the bed and did not pass out but felt "Close".  Pt denies pain.  Pt states she was recently diagnosed with a fib.

## 2020-06-10 NOTE — ED Provider Notes (Signed)
[place] Provider Note   CSN: [id number] Arrival date & time: 06/10/20  0155     History Chief Complaint  Patient presents with  . Near Syncope    [name] [name] is a 74 y.o. female.  The history is provided by the patient.  Near Syncope This is a new problem. The current episode started more than 2 days ago. The problem occurs daily. The problem has been gradually worsening. Associated symptoms include shortness of breath. Pertinent negatives include no chest pain, no abdominal pain and no headaches. The symptoms are aggravated by walking. The symptoms are relieved by rest.  Patient with history of paroxysmal A. fib, peptic ulcer disease, hypertension, hyperlipidemia presents with feeling faint and near syncope She reports for the past several days she has not felt well.  She has felt fatigued.  She has felt intermittent lightheadedness.  Tonight while getting ready for bed and walking, she began feeling lightheaded and hot.  She thought she was going to pass out but she was able to stop herself.  No falls or injuries.  She did recently get placed on flecainide and is already been on metoprolol.  She also takes anticoagulants No chest pain.  No vomiting.  She reports chronic shortness of breath She is scheduled for outpatient cardiology evaluation tomorrow/later today    Past Medical History:  Diagnosis Date  . GERD (gastroesophageal reflux disease)   . Gout   . Hyperlipidemia   . Hypertension   . Hypokalemia   . PAF (paroxysmal atrial fibrillation) ([place])   . PUD (peptic ulcer disease)   . Syncope    Neurocardiogenic    Patient Active Problem List   Diagnosis Date Noted  . Atrial fibrillation with RVR ([place]) 05/04/2020  . Hyperlipidemia   . Gout   . Encounter for screening colonoscopy   . Hemorrhoid 12/16/2018  . GERD (gastroesophageal reflux disease) 01/29/2018  . Constipation 07/17/2017  . Rectal bleeding 01/30/2017  . Chest pain 12/09/2013    . Renal insufficiency 05/01/2012  . Essential hypertension, benign 05/03/2010  . VASOVAGAL SYNCOPE 05/03/2010    Past Surgical History:  Procedure Laterality Date  . ABDOMINAL HYSTERECTOMY    . CATARACT EXTRACTION Bilateral   . COLONOSCOPY N/A 10/07/2014   Dr. Oneida Alar: redundant left colon, moderate sized external hemorrhoids   . COLONOSCOPY N/A 03/11/2020   Procedure: COLONOSCOPY;  Surgeon: Danie Binder, MD;  Location: [facility];  Service: Endoscopy;  Laterality: N/A;  9:30am w/ overtube  . LIPOMA RESECTION    . POLYPECTOMY  03/11/2020   Procedure: POLYPECTOMY;  Surgeon: Danie Binder, MD;  Location: [facility];  Service: Endoscopy;;  cecal, transverse, descending, sigmoid     OB History   No obstetric history on file.     Family History  Problem Relation Age of Onset  . Stroke Mother   . Dementia Father   . Cancer - Other Sister     Social History   Tobacco Use  . Smoking status: Former Smoker    Packs/day: 1.00    Years: 20.00    Pack years: 20.00    Types: Cigarettes    Start date: 03/16/1964    Quit date: 10/07/1997    Years since quitting: 22.6  . Smokeless tobacco: Never Used  Substance Use Topics  . Alcohol use: No    Alcohol/week: 0.0 standard drinks  . Drug use: No    Home Medications Prior to Admission medications   Medication Sig  Start Date End Date Taking? Authorizing Provider  acetaminophen (TYLENOL) 500 MG tablet Take 1,000 mg by mouth every 6 (six) hours as needed for moderate pain.    [provider]  albuterol (VENTOLIN HFA) 108 (90 Base) MCG/ACT inhaler  04/28/20   [provider]  amLODipine (NORVASC) 2.5 MG tablet Take 2.5 mg by mouth daily. 05/12/20   [provider]  apixaban (ELIQUIS) 5 MG TABS tablet Take 1 tablet (5 mg total) by mouth 2 (two) times daily. 05/03/20 04/28/21  Strader, Fransisco Hertz, PA-C  APPLE CIDER VINEGAR PO Take 2 capsules by mouth daily.    [provider]  cetirizine (ZYRTEC)  10 MG tablet Take 10 mg by mouth daily as needed for allergies.    [provider]  colchicine 0.6 MG tablet Take 0.6 mg by mouth daily. 01/31/20   [provider]  cyproheptadine (PERIACTIN) 4 MG tablet Take 4 mg by mouth 3 (three) times daily as needed for allergies.    [provider]  diclofenac Sodium (VOLTAREN) 1 % GEL Apply 1 application topically 4 (four) times daily as needed (pain).    [provider]  diltiazem (CARDIZEM) 30 MG tablet Take 1 tablet (30 mg total) by mouth daily as needed (palpitations). 05/05/20   Johnson, Clanford L, MD  ELDERBERRY PO Take 2 capsules by mouth daily.    [provider]  flecainide (TAMBOCOR) 150 MG tablet Take 0.5 tablets (75 mg total) by mouth 2 (two) times daily. 05/27/20   Evans Lance, MD  fluticasone (FLONASE) 50 MCG/ACT nasal spray Place 1 spray into both nostrils daily as needed for allergies or rhinitis.    [provider]  Fluticasone-Salmeterol (ADVAIR) 500-50 MCG/DOSE AEPB Inhale 1 puff into the lungs every 12 (twelve) hours.    [provider]  furosemide (LASIX) 40 MG tablet Take 1 tablet (40 mg total) by mouth daily. 05/27/20 08/25/20  Evans Lance, MD  hydrocortisone (PROCTOSOL HC) 2.5 % rectal cream PLACE RECTALLY 2 (TWO) TIMES DAILY. NO MORE THAN 14 DAYS AT A TIME. Patient taking differently: Place 1 application rectally 2 (two) times daily as needed for hemorrhoids.  12/16/18   Mahala Menghini, PA-C  lubiprostone (AMITIZA) 24 MCG capsule TAKE 1 CAPSULE TWICE DAILY WITH A MEAL Patient taking differently: Take 24 mcg by mouth in the morning and at bedtime.  08/11/19   Mahala Menghini, PA-C  meclizine (ANTIVERT) 25 MG tablet Take 25 mg by mouth 3 (three) times daily as needed for dizziness.     [provider]  metoprolol tartrate (LOPRESSOR) 25 MG tablet Take 25 mg by mouth 2 (two) times daily. 05/12/20   [provider]  omeprazole (PRILOSEC) 40 MG capsule Take 40  mg by mouth daily.      [provider]  potassium chloride (KLOR-CON) 10 MEQ tablet  04/17/20   [provider]  potassium chloride SA (KLOR-CON) 20 MEQ tablet Take 20 mEq by mouth in the morning, at noon, and at bedtime.    [provider]  simvastatin (ZOCOR) 40 MG tablet Take 40 mg by mouth daily in the afternoon.     [provider]  SUPREP BOWEL PREP KIT 17.5-3.13-1.6 GM/177ML SOLN SMARTSIG:354 Milliliter(s) By Mouth As Directed 03/10/20   [provider]  triamterene-hydrochlorothiazide (MAXZIDE) 75-50 MG per tablet Take 1 tablet by mouth daily.    [provider]  vitamin C (ASCORBIC ACID) 250 MG tablet Take 500 mg by mouth daily.  [provider]    Allergies    Acetaminophen-codeine, Allopurinol, Amoxicillin, and Tramadol  Review of Systems   Review of Systems  Constitutional: Positive for fatigue. Negative for fever.  Eyes: Negative for visual disturbance.  Respiratory: Positive for shortness of breath.   Cardiovascular: Positive for near-syncope. Negative for chest pain.  Gastrointestinal: Negative for abdominal pain and vomiting.  Neurological: Positive for light-headedness. Negative for syncope, speech difficulty and headaches.       Denies focal/unilateral weakness  All other systems reviewed and are negative.   Physical Exam Updated Vital Signs BP (!) 151/50   Pulse (!) 55   Temp 97.8 F (36.6 C) (Oral)   Resp 20   Ht 1.575 m (_0 )   Wt 80 kg   SpO2 100%   BMI 32.26 kg/m   Physical Exam CONSTITUTIONAL: Well developed/well nourished, well appearing HEAD: Normocephalic/atraumatic EYES: EOMI/PERRL ENMT: Mucous membranes moist NECK: supple no meningeal signs, no bruits, no JVD SPINE/BACK:entire spine nontender CV: S1/S2 noted, no murmurs/rubs/gallops noted LUNGS: Lungs are clear to auscultation bilaterally, no apparent distress ABDOMEN: soft, nontender, no rebound or guarding, bowel sounds noted  throughout abdomen GU:no cva tenderness NEURO: Pt is awake/alert/appropriate, moves all extremitiesx4.  No facial droop.  No arm or leg drift is noted.  Clear speech is noted EXTREMITIES: pulses normal/equal, full ROM, no significant lower extremity edema SKIN: warm, color normal PSYCH: no abnormalities of mood noted, alert and oriented to situation  ED Results / Procedures / Treatments   Labs (all labs ordered are listed, but only abnormal results are displayed) Labs Reviewed  BASIC METABOLIC PANEL - Abnormal; Notable for the following components:      Result Value   Sodium 129 (*)    Chloride 87 (*)    Glucose, Bld 120 (*)    Creatinine, Ser 1.43 (*)    GFR calc non Af Amer 36 (*)    GFR calc Af Amer 42 (*)    All other components within normal limits  BRAIN NATRIURETIC PEPTIDE - Abnormal; Notable for the following components:   B Natriuretic Peptide 198.0 (*)    All other components within normal limits  TROPONIN I (HIGH SENSITIVITY) - Abnormal; Notable for the following components:   Troponin I (High Sensitivity) 18 (*)    All other components within normal limits  CBC WITH DIFFERENTIAL/PLATELET    EKG EKG Interpretation  Date/Time:  Friday June 10 2020 02:07:43 EDT Ventricular Rate:  62 PR Interval:    QRS Duration: 152 QT Interval:  472 QTC Calculation: 480 R Axis:   24 Text Interpretation: Sinus rhythm Prolonged PR interval IVCD, consider atypical LBBB Baseline wander in lead(s) II III aVF V6 Interpretation limited secondary to artifact Confirmed by Ripley Fraise [phone]) on 06/10/2020 2:15:55 AM   EKG Interpretation  Date/Time:  Friday June 10 2020 03:25:16 EDT Ventricular Rate:  56 PR Interval:    QRS Duration: 142 QT Interval:  507 QTC Calculation: 490 R Axis:   28 Text Interpretation: Sinus rhythm Prolonged PR interval IVCD, consider atypical LBBB similar to EKG from May 2020 Confirmed by Ripley Fraise [phone]) on 06/10/2020 4:01:06 AM        Radiology No results found.  Procedures Procedures   Medications Ordered in ED Medications  sodium chloride 0.9 % bolus 1,000 mL (0 mLs Intravenous Stopped 06/10/20 0335)    ED Course  I have reviewed the triage vital signs and the nursing notes.  Pertinent labs results that were available during  my care of the patient were reviewed by me and considered in my medical decision making (see chart for details).    MDM Rules/Calculators/A&P                           3:22 AM Patient presents with near syncope.  She reports feeling fatigued and unwell for several days.  Tonight she almost passed out.  She has been on flecainide now for the past 2 weeks.  In addition she is on metoprolol.  I suspect that her symptoms may be medication related.  Labs also reveal dehydration which could be related to her diuretic use.  This could also be contributing to her symptoms.  Overall patient is well-appearing.  EKG reveals sinus bradycardia without any dysrhythmia. 4:09 AM Patient improved in the ER.  IV fluids were given to patient.  She ambulated without difficulty.  Heart rate remained stable in the 50s.  Telemetry monitoring did reveal brief sinus pauses.  No signs of AV block, no signs of dysrhythmia Patient is requesting discharge home.  She is scheduled to have an outpatient nurse visit with labs and EKG testing.  I have sent messaging to her cardiologist to see if this needs to be repeated or if tonight's findings can be used   This patient presents to the ED for concern of near syncope, this involves an extensive number of treatment options, and is a complaint that carries with it a high risk of complications and morbidity.  The differential diagnosis includes acute coronary syndrome, cardiac arrhythmia, dehydration   Lab Tests:   I Ordered, reviewed, and interpreted labs, which included electrolytes, troponin, complete blood count, BNP  Medicines ordered:   I ordered medication  normal saline for dehydration    Previous records obtained and reviewed   Reevaluation:  After the interventions stated above, I reevaluated the patient and found pt is improved and ambulatory   Final Clinical Impression(s) / ED Diagnoses Final diagnoses:  Near syncope  Dehydration    Rx / DC Orders ED Discharge Orders    None       Ripley Fraise, MD 06/10/20 [phone]

## 2020-06-10 NOTE — ED Provider Notes (Signed)
.  1-3 Lead EKG Interpretation Performed by: Ripley Fraise, MD Authorized by: Ripley Fraise, MD     Interpretation: normal     ECG rate:  50   ECG rate assessment: bradycardic     Rhythm: sinus bradycardia     Ectopy: PVCs     Conduction: normal        Ripley Fraise, MD 06/10/20 [phone]

## 2020-06-10 NOTE — Telephone Encounter (Signed)
Pt had EKG today in ER , wants to cancel nurse apt today for EKG, will forward message to Dr.Taylor.Was told she was dehydrated.

## 2020-06-10 NOTE — ED Notes (Signed)
Patient ambulated to bathroom and around nurse's station without difficulty

## 2020-06-10 NOTE — ED Notes (Signed)
ED Provider at bedside. 

## 2020-06-10 NOTE — Telephone Encounter (Signed)
New message   Patient was in hospital this morning  Does she need to keep office visit with nurse today??

## 2020-06-15 ENCOUNTER — Encounter: Payer: Self-pay | Admitting: Neurology

## 2020-06-15 ENCOUNTER — Other Ambulatory Visit: Payer: Self-pay

## 2020-06-15 ENCOUNTER — Ambulatory Visit: Payer: Self-pay | Admitting: Neurology

## 2020-06-15 VITALS — BP 186/84 | HR 69 | Ht 62.0 in | Wt 171.0 lb

## 2020-06-15 DIAGNOSIS — I480 Paroxysmal atrial fibrillation: Secondary | ICD-10-CM | POA: Diagnosis not present

## 2020-06-15 DIAGNOSIS — R0683 Snoring: Secondary | ICD-10-CM | POA: Diagnosis not present

## 2020-06-15 DIAGNOSIS — R001 Bradycardia, unspecified: Secondary | ICD-10-CM | POA: Insufficient documentation

## 2020-06-15 DIAGNOSIS — I10 Essential (primary) hypertension: Secondary | ICD-10-CM | POA: Insufficient documentation

## 2020-06-15 DIAGNOSIS — R002 Palpitations: Secondary | ICD-10-CM | POA: Diagnosis not present

## 2020-06-15 DIAGNOSIS — R55 Syncope and collapse: Secondary | ICD-10-CM

## 2020-06-15 NOTE — Patient Instructions (Signed)
Sleep Apnea Sleep apnea is a condition in which breathing pauses or becomes shallow during sleep. Episodes of sleep apnea usually last 10 seconds or longer, and they may occur as many as 20 times an hour. Sleep apnea disrupts your sleep and keeps your body from getting the rest that it needs. This condition can increase your risk of certain health problems, including:  Heart attack.  Stroke.  Obesity.  Diabetes.  Heart failure.  Irregular heartbeat. What are the causes? There are three kinds of sleep apnea:  Obstructive sleep apnea. This kind is caused by a blocked or collapsed airway.  Central sleep apnea. This kind happens when the part of the brain that controls breathing does not send the correct signals to the muscles that control breathing.  Mixed sleep apnea. This is a combination of obstructive and central sleep apnea. The most common cause of this condition is a collapsed or blocked airway. An airway can collapse or become blocked if:  Your throat muscles are abnormally relaxed.  Your tongue and tonsils are larger than normal.  You are overweight.  Your airway is smaller than normal. What increases the risk? You are more likely to develop this condition if you:  Are overweight.  Smoke.  Have a smaller than normal airway.  Are elderly.  Are female.  Drink alcohol.  Take sedatives or tranquilizers.  Have a family history of sleep apnea. What are the signs or symptoms? Symptoms of this condition include:  Trouble staying asleep.  Daytime sleepiness and tiredness.  Irritability.  Loud snoring.  Morning headaches.  Trouble concentrating.  Forgetfulness.  Decreased interest in sex.  Unexplained sleepiness.  Mood swings.  Personality changes.  Feelings of depression.  Waking up often during the night to urinate.  Dry mouth.  Sore throat. How is this diagnosed? This condition may be diagnosed with:  A medical history.  A physical  exam.  A series of tests that are done while you are sleeping (sleep study). These tests are usually done in a sleep lab, but they may also be done at home. How is this treated? Treatment for this condition aims to restore normal breathing and to ease symptoms during sleep. It may involve managing health issues that can affect breathing, such as high blood pressure or obesity. Treatment may include:  Sleeping on your side.  Using a decongestant if you have nasal congestion.  Avoiding the use of depressants, including alcohol, sedatives, and narcotics.  Losing weight if you are overweight.  Making changes to your diet.  Quitting smoking.  Using a device to open your airway while you sleep, such as: ? An oral appliance. This is a custom-made mouthpiece that shifts your lower jaw forward. ? A continuous positive airway pressure (CPAP) device. This device blows air through a mask when you breathe out (exhale). ? A nasal expiratory positive airway pressure (EPAP) device. This device has valves that you put into each nostril. ? A bi-level positive airway pressure (BPAP) device. This device blows air through a mask when you breathe in (inhale) and breathe out (exhale).  Having surgery if other treatments do not work. During surgery, excess tissue is removed to create a wider airway. It is important to get treatment for sleep apnea. Without treatment, this condition can lead to:  High blood pressure.  Coronary artery disease.  In men, an inability to achieve or maintain an erection (impotence).  Reduced thinking abilities. Follow these instructions at home: Lifestyle  Make any lifestyle changes   that your health care provider recommends.  Eat a healthy, well-balanced diet.  Take steps to lose weight if you are overweight.  Avoid using depressants, including alcohol, sedatives, and narcotics.  Do not use any products that contain nicotine or tobacco, such as cigarettes,  e-cigarettes, and chewing tobacco. If you need help quitting, ask your health care provider. General instructions  Take over-the-counter and prescription medicines only as told by your health care provider.  If you were given a device to open your airway while you sleep, use it only as told by your health care provider.  If you are having surgery, make sure to tell your health care provider you have sleep apnea. You may need to bring your device with you.  Keep all follow-up visits as told by your health care provider. This is important. Contact a health care provider if:  The device that you received to open your airway during sleep is uncomfortable or does not seem to be working.  Your symptoms do not improve.  Your symptoms get worse. Get help right away if:  You develop: ? Chest pain. ? Shortness of breath. ? Discomfort in your back, arms, or stomach.  You have: ? Trouble speaking. ? Weakness on one side of your body. ? Drooping in your face. These symptoms may represent a serious problem that is an emergency. Do not wait to see if the symptoms will go away. Get medical help right away. Call your local emergency services (911 in the [place] Do not drive yourself to the hospital. Summary  Sleep apnea is a condition in which breathing pauses or becomes shallow during sleep.  The most common cause is a collapsed or blocked airway.  The goal of treatment is to restore normal breathing and to ease symptoms during sleep. This information is not intended to replace advice given to you by your health care provider. Make sure you discuss any questions you have with your health care provider. Document Revised: 05/27/2019 Document Reviewed: 08/05/2018 [employer] Patient Education  2020 [employer]  

## 2020-06-15 NOTE — Progress Notes (Signed)
SLEEP MEDICINE CLINIC    Provider:  Larey Seat, MD  Primary Care Physician:  Andres Shad, MD [address] [place] [address]     Referring Provider:         Chief Complaint according to patient   Patient presents with:    . New Patient (Initial Visit)           HISTORY OF PRESENT ILLNESS:  [name] [name] is a 74 y.o. year old  African American female patient is seen upon Cardiology referral on 06/15/2020 from Dr. Crissie Sickles, MD.  Chief concern according to patient :  I may need a sleep study to check causes for my atrial fibrillation'   I have the pleasure of seeing [name] today, a right -handed African American female with a possible sleep disorder.  She  has a past medical history of GERD (gastroesophageal reflux disease), Gout, Hyperlipidemia, Hypertension, Hypokalemia, PAF (paroxysmal atrial fibrillation) ([place]), PUD (peptic ulcer disease), and Syncope.. She had palpitations and was diagnosed with atrial fibrillation. Thursday night she was bradycardic and went to the ED for near syncope.      Sleep relevant medical history: nocturnal palpitations, Nocturia 3-4, no Tonsillectomy. GERD.  Family medical /sleep history: No other family member on CPAP with OSA. sister with atrial fibrillation, maternal GM died of CHF.    Social history:  Patient is retired from Agricultural consultant at [employer] and lives in a household alone.  Family status is single , without children. She lost her partner of 35 years in September.  Pets are not present. Tobacco use- 1 ppd for 15 years, quit 23 years ago. Marland Kitchen  ETOH use ' none ,  Caffeine intake in form of Coffee( 1 cup / day) Soda( none ) Tea ( none ) or energy drinks. Hobbies : gardening, canning.     Sleep habits are as follows: The patient's dinner time is between 5-7 PM. The patient goes to bed at 11 PM and continues to sleep for 2 hours, wakes for bathroom breaks, the first time at 1 AM.   The preferred sleep  position is right sided, supine , with the support of 4 pillows.  GERD. Dreams are reportedly rare. Her sister has witnessed her to snore.  8 AM is the usual rise time.  The patient wakes up spontaneously.  She reports not feeling refreshed or restored in AM, with symptoms such as palpitations, dry mouth,  residual fatigue.  Naps are taken frequently, lasting from 30 minutes and are more refreshing than nocturnal sleep.    Review of Systems: Out of a complete 14 system review, the patient complains of only the following symptoms, and all other reviewed systems are negative.:  Fatigue, sleepiness , snoring, fragmented sleep, nocturia, loud gasping, palpitations.    How likely are you to doze in the following situations: 0 = not likely, 1 = slight chance, 2 = moderate chance, 3 = high chance   Sitting and Reading? Watching Television? Sitting inactive in a public place (theater or meeting)? As a passenger in a car for an hour without a break? Lying down in the afternoon when circumstances permit? Sitting and talking to someone? Sitting quietly after lunch without alcohol? In a car, while stopped for a few minutes in traffic?   Total = 2/ 24 points   FSS endorsed at 48/ 63 points. Blood pressure medication, SOB, fainty.   Social History   Socioeconomic History  . Marital status:  Single    Spouse name: Not on file  . Number of children: Not on file  . Years of education: Not on file  . Highest education level: Not on file  Occupational History  . Occupation: Retired  Tobacco Use  . Smoking status: Former Smoker    Packs/day: 1.00    Years: 20.00    Pack years: 20.00    Types: Cigarettes    Start date: 03/16/1964    Quit date: 10/07/1997    Years since quitting: 22.7  . Smokeless tobacco: Never Used  Substance and Sexual Activity  . Alcohol use: No    Alcohol/week: 0.0 standard drinks  . Drug use: No  . Sexual activity: Yes    Partners: Male  Other Topics Concern  . Not  on file  Social History Narrative  . Not on file   Social Determinants of Health   Financial Resource Strain:   . Difficulty of Paying Living Expenses:   Food Insecurity:   . Worried About Charity fundraiser in the Last Year:   . Arboriculturist in the Last Year:   Transportation Needs:   . Film/video editor (Medical):   Marland Kitchen Lack of Transportation (Non-Medical):   Physical Activity:   . Days of Exercise per Week:   . Minutes of Exercise per Session:   Stress:   . Feeling of Stress :   Social Connections:   . Frequency of Communication with Friends and Family:   . Frequency of Social Gatherings with Friends and Family:   . Attends Religious Services:   . Active Member of Clubs or Organizations:   . Attends Archivist Meetings:   Marland Kitchen Marital Status:     Family History  Problem Relation Age of Onset  . Stroke Mother   . Dementia Father   . Cancer - Other Sister     Past Medical History:  Diagnosis Date  . GERD (gastroesophageal reflux disease)   . Gout   . Hyperlipidemia   . Hypertension   . Hypokalemia   . PAF (paroxysmal atrial fibrillation) ([facility])   . PUD (peptic ulcer disease)   . Syncope    Neurocardiogenic    Past Surgical History:  Procedure Laterality Date  . ABDOMINAL HYSTERECTOMY    . CATARACT EXTRACTION Bilateral   . COLONOSCOPY N/A 10/07/2014   Dr. Oneida Alar: redundant left colon, moderate sized external hemorrhoids   . COLONOSCOPY N/A 03/11/2020   Procedure: COLONOSCOPY;  Surgeon: Danie Binder, MD;  Location: [facility];  Service: Endoscopy;  Laterality: N/A;  9:30am w/ overtube  . LIPOMA RESECTION    . POLYPECTOMY  03/11/2020   Procedure: POLYPECTOMY;  Surgeon: Danie Binder, MD;  Location: [facility];  Service: Endoscopy;;  cecal, transverse, descending, sigmoid     Current Outpatient Medications on File Prior to Visit  Medication Sig Dispense Refill  . acetaminophen (TYLENOL) 500 MG tablet Take 1,000 mg by mouth every 6  (six) hours as needed for moderate pain.    Marland Kitchen apixaban (ELIQUIS) 5 MG TABS tablet Take 1 tablet (5 mg total) by mouth 2 (two) times daily. 60 tablet 11  . APPLE CIDER VINEGAR PO Take 2 capsules by mouth daily.    . cetirizine (ZYRTEC) 10 MG tablet Take 10 mg by mouth daily as needed for allergies.    Marland Kitchen colchicine 0.6 MG tablet Take 0.6 mg by mouth daily.    . cyproheptadine (PERIACTIN) 4 MG tablet Take 4  mg by mouth 3 (three) times daily as needed for allergies.    Marland Kitchen diclofenac Sodium (VOLTAREN) 1 % GEL Apply 1 application topically 4 (four) times daily as needed (pain).    Marland Kitchen diltiazem (CARDIZEM) 30 MG tablet Take 1 tablet (30 mg total) by mouth daily as needed (palpitations). 30 tablet 1  . flecainide (TAMBOCOR) 150 MG tablet Take 0.5 tablets (75 mg total) by mouth 2 (two) times daily. 90 tablet 3  . fluticasone (FLONASE) 50 MCG/ACT nasal spray Place 1 spray into both nostrils daily as needed for allergies or rhinitis.    . Fluticasone-Salmeterol (ADVAIR) 500-50 MCG/DOSE AEPB Inhale 1 puff into the lungs every 12 (twelve) hours.    . furosemide (LASIX) 40 MG tablet Take 1 tablet (40 mg total) by mouth daily. 90 tablet 3  . hydrocortisone (PROCTOSOL HC) 2.5 % rectal cream PLACE RECTALLY 2 (TWO) TIMES DAILY. NO MORE THAN 14 DAYS AT A TIME. (Patient taking differently: Place 1 application rectally 2 (two) times daily as needed for hemorrhoids. ) 28.35 g 3  . lubiprostone (AMITIZA) 24 MCG capsule TAKE 1 CAPSULE TWICE DAILY WITH A MEAL (Patient taking differently: Take 24 mcg by mouth in the morning and at bedtime. ) 180 capsule 3  . meclizine (ANTIVERT) 25 MG tablet Take 25 mg by mouth 3 (three) times daily as needed for dizziness.     . metoprolol tartrate (LOPRESSOR) 25 MG tablet Take 25 mg by mouth 2 (two) times daily.    Marland Kitchen omeprazole (PRILOSEC) 40 MG capsule Take 40 mg by mouth daily.      . potassium chloride SA (KLOR-CON) 20 MEQ tablet Take 20 mEq by mouth in the morning, at noon, and at bedtime.     . simvastatin (ZOCOR) 40 MG tablet Take 40 mg by mouth daily in the afternoon.     . triamterene-hydrochlorothiazide (MAXZIDE) 75-50 MG per tablet Take 1 tablet by mouth daily.    . vitamin C (ASCORBIC ACID) 250 MG tablet Take 500 mg by mouth daily.    Marland Kitchen ELDERBERRY PO Take 2 capsules by mouth daily. (Patient not taking: Reported on 06/15/2020)     No current facility-administered medications on file prior to visit.    Allergies  Allergen Reactions  . Acetaminophen-Codeine Hives  . Allopurinol Hives  . Amoxicillin Hives    Did it involve swelling of the face/tongue/throat, SOB, or low BP? No Did it involve sudden or severe rash/hives, skin peeling, or any reaction on the inside of your mouth or nose? No Did you need to seek medical attention at a hospital or doctor's office? No When did it last happen?10 + years If all above answers are "NO", may proceed with cephalosporin use.   . Tramadol Hives    Physical exam:  Today's Vitals   06/15/20 1041  BP: (!) 186/84  Pulse: 69  Weight: 171 lb (77.6 kg)  Height: 5\' 2"  (1.575 m)   Body mass index is 31.28 kg/m.   Wt Readings from Last 3 Encounters:  06/15/20 171 lb (77.6 kg)  06/10/20 176 lb 5.9 oz (80 kg)  05/27/20 178 lb 6.4 oz (80.9 kg)     Ht Readings from Last 3 Encounters:  06/15/20 5\' 2"  (1.575 m)  06/10/20 5\' 2"  (1.575 m)  05/27/20 5\' 2"  (1.575 m)      General: The patient is awake, alert and appears not in acute distress. The patient is well groomed. Head: Normocephalic, atraumatic. Neck is supple. Mallampati 3 plus ,  neck circumference: 15. 25 inches . Nasal airflow patent.  Retrognathia is seen.  Dental status: partial dentures.  Cardiovascular:  irrregular  rate and cardiac rhythm by pulse,  without distended neck veins. Respiratory: Lungs are clear to auscultation.  Skin:  Without evidence of ankle edema, or rash. Trunk: The patient's posture is erect.   Neurologic exam : The patient is awake and  alert, oriented to place and time.   Memory subjective described as intact.  Attention span & concentration ability appears normal.  Speech is fluent,  without  dysarthria, dysphonia or aphasia.  Mood and affect are appropriate.   Cranial nerves: no loss of smell or taste reported - fully vaccinated.  Pupils are equal and briskly reactive to light. status post lens replacement bilaterally-  Funduscopic exam deferred. .  Extraocular movements in vertical and horizontal planes were intact and without nystagmus. No Diplopia. Visual fields by finger perimetry are intact. Hearing was intact to soft voice and finger rubbing.    Facial sensation intact to fine touch.  Facial motor strength is symmetric and tongue and uvula move midline.  Neck ROM : rotation, tilt and flexion extension were normal for age and shoulder shrug was symmetrical.    Motor exam:  Symmetric bulk, tone and ROM.   Normal tone without cog wheeling, but left sided weaker grip strength./carpal tunnel    Sensory:  Fine touch, pinprick and vibration were tested  and  normal.  Proprioception tested in the upper extremities was normal.   Coordination: Rapid alternating movements in the fingers/hands were of normal speed.  The Finger-to-nose maneuver was intact without evidence of ataxia, dysmetria or tremor.   Gait and station: Patient could rise unassisted from a seated position, walked without assistive device.  Stance is of normal width/ base and the patient turned with 4 steps.  Toe and heel walk were deferred.  Deep tendon reflexes: in the  upper and lower extremities are symmetric and intact.  Babinski response was deferred.       After spending a total time of  45  minutes face to face and additional time for physical and neurologic examination, review of laboratory studies,  personal review of imaging studies, reports and results of other testing and review of referral information / records as far as provided in  visit, I have established the following assessments:  1) atrial fibrillation,  rate controlled  But recent near fainting spell under bradycardia.  2) OSA is a possible contributor- the patient has a narrow upper airway, and retrognthia.  3) near syncope with bradycardia .  4) excessive Blood pressure.    My Plan is to proceed with:  1) attended sleep study -  Atrial fib, near syncope and HTN. Critical    I would like to thank Andres Shad, MD and Andres Shad, Md [address] [place] [address] for allowing me to meet with and to take care of this pleasant patient.   In short, [name] [name] is presenting with Hypertensive Epsiode, bradycardia, SOB  and near syncope. , a symptom that can be attributed to atrial fibrillation.   I plan to follow up either personally within 2-3  month.   CC: I will share my notes with PCP and Dr Crissie Sickles, MD   Electronically signed by: Larey Seat, MD 06/15/2020 11:13 AM  [facility] Board certified by The [employer] of Sleep Medicine and Diplomate of the [employer] of Sleep  Medicine. Board certified In Neurology through the [place], Fellow of the [employer] of Neurology. Medical Director of [facility].

## 2020-06-20 ENCOUNTER — Telehealth: Payer: Self-pay

## 2020-06-20 DIAGNOSIS — I4891 Unspecified atrial fibrillation: Secondary | ICD-10-CM

## 2020-06-20 DIAGNOSIS — I10 Essential (primary) hypertension: Secondary | ICD-10-CM

## 2020-06-20 DIAGNOSIS — R55 Syncope and collapse: Secondary | ICD-10-CM

## 2020-06-20 NOTE — Telephone Encounter (Signed)
HST ordered for the pt

## 2020-06-20 NOTE — Telephone Encounter (Signed)
Submitted in lab sleep study to [facility] [place] and it was denied even with A-fib. Will need a HST order.

## 2020-07-25 ENCOUNTER — Other Ambulatory Visit: Payer: Self-pay

## 2020-07-25 MED ORDER — APIXABAN 5 MG PO TABS: 5.0000 mg | ORAL_TABLET | Freq: Two times a day (BID) | ORAL | 3 refills | Status: DC

## 2020-07-25 NOTE — Telephone Encounter (Signed)
Refilled 90 day for eliquis

## 2020-07-25 NOTE — Telephone Encounter (Signed)
New message    Medicare is requesting new prescription to be sent as a 90 day supply instead of 30 - pt medicare coverage is changing and it has to be 90 day  *STAT* If patient is at the pharmacy, call can be transferred to refill team.   1. Which medications need to be refilled? (please list name of each medication and dose if known)  eliquis   2. Which pharmacy/location (including street and city if local pharmacy) is medication to be sent to?  [facility] [address] [place]  3. Do they need a 30 day or 90 day supply?  [place]

## 2020-08-01 ENCOUNTER — Ambulatory Visit (INDEPENDENT_AMBULATORY_CARE_PROVIDER_SITE_OTHER): Payer: Medicare HMO | Admitting: Neurology

## 2020-08-01 DIAGNOSIS — R55 Syncope and collapse: Secondary | ICD-10-CM

## 2020-08-01 DIAGNOSIS — G4733 Obstructive sleep apnea (adult) (pediatric): Secondary | ICD-10-CM | POA: Diagnosis not present

## 2020-08-01 DIAGNOSIS — I10 Essential (primary) hypertension: Secondary | ICD-10-CM

## 2020-08-01 DIAGNOSIS — I4891 Unspecified atrial fibrillation: Secondary | ICD-10-CM

## 2020-08-03 ENCOUNTER — Encounter: Payer: Self-pay | Admitting: Student

## 2020-08-03 ENCOUNTER — Ambulatory Visit (INDEPENDENT_AMBULATORY_CARE_PROVIDER_SITE_OTHER): Payer: Medicare HMO | Admitting: Student

## 2020-08-03 ENCOUNTER — Other Ambulatory Visit: Payer: Self-pay

## 2020-08-03 ENCOUNTER — Other Ambulatory Visit (HOSPITAL_COMMUNITY): Payer: Medicare HMO

## 2020-08-03 VITALS — BP 160/68 | HR 72 | Ht 62.0 in | Wt 168.0 lb

## 2020-08-03 DIAGNOSIS — E782 Mixed hyperlipidemia: Secondary | ICD-10-CM | POA: Diagnosis not present

## 2020-08-03 DIAGNOSIS — M7989 Other specified soft tissue disorders: Secondary | ICD-10-CM

## 2020-08-03 DIAGNOSIS — I10 Essential (primary) hypertension: Secondary | ICD-10-CM

## 2020-08-03 DIAGNOSIS — I480 Paroxysmal atrial fibrillation: Secondary | ICD-10-CM | POA: Diagnosis not present

## 2020-08-03 DIAGNOSIS — R0683 Snoring: Secondary | ICD-10-CM

## 2020-08-03 MED ORDER — LOSARTAN POTASSIUM 25 MG PO TABS
25.0000 mg | ORAL_TABLET | Freq: Every day | ORAL | 3 refills | Status: DC
Start: 2020-08-03 — End: 2020-08-10

## 2020-08-03 NOTE — Progress Notes (Signed)
Cardiology Office Note    Date:  08/03/2020   ID:  [name], [name] 1946-04-18, MRN [id number]  PCP:  [name] Shad, MD  Cardiologist: [name] Lesches, MD    Chief Complaint  Patient presents with  . Follow-up    History of Present Illness:    [name] [name] is a 74 y.o. female with past medical history of paroxysmal atrial fibrillation, HTN, HLD, lower extremity edema and vasovagal syncope who presents to the office today for 36-month follow-up.  She was last examined by myself in 04/2020 following a recent ED visit for palpitations and found to be in atrial fibrillation with RVR. She converted back to normal sinus rhythm while on IV Cardizem and was continued on Toprol-XL 25 mg daily with Eliquis 5 mg twice daily being initiated. She denied any recurrent symptoms at the time of her visit. An echocardiogram was arranged to assess for any structural abnormalities. She presented to the ED again on 05/04/2020 for recurrent dyspnea and chest discomfort, found to be back in atrial fibrillation with RVR.  Was again started on IV Cardizem and converted back to normal sinus rhythm. Echocardiogram was obtained that admission showed a preserved EF of 65 to 70% with no regional wall motion abnormalities. She did have moderate LVH and grade 2 diastolic dysfunction. Was also noted to have a nodular calcification on the posterior mitral leaflet which was felt to possibly represent a calcified vegetation. She was discharged home and EP follow-up was recommended to discuss possible antiarrhythmic therapy.  Was evaluated by Dr. Lovena Le on 05/27/2020 and started on Flecainide 75mg  BID. Was recommended she have a sleep study to rule-out OSA given her reported snoring.   She presented back to the ED on 06/10/2020 and reported overall generalized fatigue and near syncope.  She was in normal sinus rhythm while in the ED but was found to be dehydrated and treated with IV fluids. Lopressor was discontinued  as she was bradycardic with heart rate in the 40's to 50's.  In talking with the patient and her sister today, she reports overall doing well from cardiac perspective since her last visit. She does report brief palpitations since being started on Flecainide but denies any persistent symptoms resembling her prior atrial fibrillation. She does have fatigue at baseline but denies any exertional chest pain or dyspnea on exertion. No recent orthopnea or PND. She was started on Losartan 50 mg daily by her PCP but says she developed hypotension with this and was instructed to cut the tablet in half. She has been taking this intermittently.  Past Medical History:  Diagnosis Date  . GERD (gastroesophageal reflux disease)   . Gout   . Hyperlipidemia   . Hypertension   . Hypokalemia   . PAF (paroxysmal atrial fibrillation) ([place])   . PUD (peptic ulcer disease)   . Syncope    Neurocardiogenic    Past Surgical History:  Procedure Laterality Date  . ABDOMINAL HYSTERECTOMY    . CATARACT EXTRACTION Bilateral   . COLONOSCOPY N/A 10/07/2014   Dr. Oneida Alar: redundant left colon, moderate sized external hemorrhoids   . COLONOSCOPY N/A 03/11/2020   Procedure: COLONOSCOPY;  Surgeon: [name] Binder, MD;  Location: [facility];  Service: Endoscopy;  Laterality: N/A;  9:30am w/ overtube  . LIPOMA RESECTION    . POLYPECTOMY  03/11/2020   Procedure: POLYPECTOMY;  Surgeon: [name] Binder, MD;  Location: [facility];  Service: Endoscopy;;  cecal, transverse, descending, sigmoid  Current Medications: Outpatient Medications Prior to Visit  Medication Sig Dispense Refill  . acetaminophen (TYLENOL) 500 MG tablet Take 1,000 mg by mouth every 6 (six) hours as needed for moderate pain.    Marland Kitchen apixaban (ELIQUIS) 5 MG TABS tablet Take 1 tablet (5 mg total) by mouth 2 (two) times daily. 180 tablet 3  . APPLE CIDER VINEGAR PO Take 2 capsules by mouth daily.    . cetirizine (ZYRTEC) 10 MG tablet Take 10 mg by mouth  daily as needed for allergies.    Marland Kitchen colchicine 0.6 MG tablet Take 0.6 mg by mouth daily.    . cyproheptadine (PERIACTIN) 4 MG tablet Take 4 mg by mouth 3 (three) times daily as needed for allergies.    Marland Kitchen diclofenac Sodium (VOLTAREN) 1 % GEL Apply 1 application topically 4 (four) times daily as needed (pain).    Marland Kitchen diltiazem (CARDIZEM) 30 MG tablet Take 1 tablet (30 mg total) by mouth daily as needed (palpitations). 30 tablet 1  . ELDERBERRY PO Take 2 capsules by mouth daily.     . flecainide (TAMBOCOR) 150 MG tablet Take 0.5 tablets (75 mg total) by mouth 2 (two) times daily. 90 tablet 3  . fluticasone (FLONASE) 50 MCG/ACT nasal spray Place 1 spray into both nostrils daily as needed for allergies or rhinitis.    . Fluticasone-Salmeterol (ADVAIR) 500-50 MCG/DOSE AEPB Inhale 1 puff into the lungs every 12 (twelve) hours.    . furosemide (LASIX) 40 MG tablet Take 1 tablet (40 mg total) by mouth daily. 90 tablet 3  . hydrocortisone (PROCTOSOL HC) 2.5 % rectal cream PLACE RECTALLY 2 (TWO) TIMES DAILY. NO MORE THAN 14 DAYS AT A TIME. (Patient taking differently: Place 1 application rectally 2 (two) times daily as needed for hemorrhoids. ) 28.35 g 3  . lubiprostone (AMITIZA) 24 MCG capsule TAKE 1 CAPSULE TWICE DAILY WITH A MEAL (Patient taking differently: Take 24 mcg by mouth in the morning and at bedtime. ) 180 capsule 3  . meclizine (ANTIVERT) 25 MG tablet Take 25 mg by mouth 3 (three) times daily as needed for dizziness.     Marland Kitchen omeprazole (PRILOSEC) 40 MG capsule Take 40 mg by mouth daily.      . potassium chloride SA (KLOR-CON) 20 MEQ tablet Take 20 mEq by mouth in the morning, at noon, and at bedtime.    . simvastatin (ZOCOR) 40 MG tablet Take 40 mg by mouth daily in the afternoon.     . triamterene-hydrochlorothiazide (MAXZIDE) 75-50 MG per tablet Take 1 tablet by mouth daily.    . vitamin C (ASCORBIC ACID) 250 MG tablet Take 500 mg by mouth daily.    . metoprolol tartrate (LOPRESSOR) 25 MG tablet  Take 25 mg by mouth 2 (two) times daily.     No facility-administered medications prior to visit.     Allergies:   Acetaminophen-codeine, Allopurinol, Amlodipine, Amoxicillin, and Tramadol   Social History   Socioeconomic History  . Marital status: Single    Spouse name: Not on file  . Number of children: Not on file  . Years of education: Not on file  . Highest education level: Not on file  Occupational History  . Occupation: Retired  Tobacco Use  . Smoking status: Former Smoker    Packs/day: 1.00    Years: 20.00    Pack years: 20.00    Types: Cigarettes    Start date: 03/16/1964    Quit date: 10/07/1997    Years since quitting: 22.8  .  Smokeless tobacco: Never Used  Substance and Sexual Activity  . Alcohol use: No    Alcohol/week: 0.0 standard drinks  . Drug use: No  . Sexual activity: Yes    Partners: Male  Other Topics Concern  . Not on file  Social History Narrative  . Not on file   Social Determinants of Health   Financial Resource Strain:   . Difficulty of Paying Living Expenses:   Food Insecurity:   . Worried About Charity fundraiser in the Last Year:   . Arboriculturist in the Last Year:   Transportation Needs:   . Film/video editor (Medical):   Marland Kitchen Lack of Transportation (Non-Medical):   Physical Activity:   . Days of Exercise per Week:   . Minutes of Exercise per Session:   Stress:   . Feeling of Stress :   Social Connections:   . Frequency of Communication with Friends and Family:   . Frequency of Social Gatherings with Friends and Family:   . Attends Religious Services:   . Active Member of Clubs or Organizations:   . Attends Archivist Meetings:   Marland Kitchen Marital Status:      Family History:  The patient's family history includes Cancer - Other in her sister; Dementia in her father; Stroke in her mother.   Review of Systems:   Please see the history of present illness.     General:  No chills, fever, night sweats or weight  changes.  Cardiovascular:  No chest pain, dyspnea on exertion,  orthopnea, palpitations, paroxysmal nocturnal dyspnea. Positive for edema (improved).  Dermatological: No rash, lesions/masses Respiratory: No cough, dyspnea Urologic: No hematuria, dysuria Abdominal:   No nausea, vomiting, diarrhea, bright red blood per rectum, melena, or hematemesis Neurologic:  No visual changes, wkns, changes in mental status. All other systems reviewed and are otherwise negative except as noted above.   Physical Exam:    VS:  BP (!) 160/68   Pulse 72   Ht 5\' 2"  (1.575 m)   Wt 168 lb (76.2 kg)   SpO2 98%   BMI 30.73 kg/m    General: Well developed, well nourished,female appearing in no acute distress. Head: Normocephalic, atraumatic. Wearing a mask.  Neck: No carotid bruits. JVD not elevated.  Lungs: Respirations regular and unlabored, without wheezes or rales.  Heart: Regular rate and rhythm. No S3 or S4.  No murmur, no rubs, or gallops appreciated. Abdomen: Appears non-distended. No obvious abdominal masses. Msk:  Strength and tone appear normal for age. No obvious joint deformities or effusions. Extremities: No clubbing or cyanosis. No lower extremity edema.  Distal pedal pulses are 2+ bilaterally. Neuro: Alert and oriented X 3. Moves all extremities spontaneously. No focal deficits noted. Psych:  Responds to questions appropriately with a normal affect. Skin: No rashes or lesions noted  Wt Readings from Last 3 Encounters:  08/03/20 168 lb (76.2 kg)  06/15/20 171 lb (77.6 kg)  06/10/20 176 lb 5.9 oz (80 kg)     Studies/Labs Reviewed:   EKG:  EKG is ordered today.  The ekg ordered today demonstrates NSR, HR 70 with IVCD and LVH with associated repol abnormality along the lateral leads which is similar to prior tracings.   Recent Labs: 05/04/2020: Magnesium 2.1 05/05/2020: ALT 13; TSH 0.893 06/10/2020: B Natriuretic Peptide 198.0; BUN 20; Creatinine, Ser 1.43; Hemoglobin 12.1; Platelets 363;  Potassium 3.8; Sodium 129   Lipid Panel No results found for: CHOL, TRIG, HDL, CHOLHDL,  VLDL, LDLCALC, LDLDIRECT  Additional studies/ records that were reviewed today include:   Echocardiogram: 04/2020 IMPRESSIONS    1. Left ventricular ejection fraction, by estimation, is 65 to 70%. The  left ventricle has normal function. The left ventricle has no regional  wall motion abnormalities. There is moderate left ventricular hypertrophy  of the posterior segment. Left  ventricular diastolic parameters are consistent with Grade II diastolic  dysfunction (pseudonormalization). Elevated left atrial pressure.  2. Right ventricular systolic function is normal. The right ventricular  size is normal. There is normal pulmonary artery systolic pressure.  3. Left atrial size was moderately dilated.  4. Nodular calcification on posterior mitral leaflet measuring 1.56 cm x  1.65 cm. This may represent a calcified vegetation. The mitral valve is  degenerative. Mild mitral valve regurgitation. Moderate annular  calcification. Chordal calcification also  noted.  5. The aortic valve is tricuspid. Aortic valve regurgitation is not  visualized. Mild aortic valve sclerosis is present, with no evidence of  aortic valve stenosis.  6. The inferior vena cava is normal in size with greater than 50%  respiratory variability, suggesting right atrial pressure of 3 mmHg.   Assessment:    1. PAF (paroxysmal atrial fibrillation) ([place])   2. Essential hypertension   3. Leg swelling   4. Mixed hyperlipidemia   5. Snoring      Plan:   In order of problems listed above:  1. Paroxysmal Atrial Fibrillation - She experienced multiple episodes of atrial fibrillation earlier in the year and was evaluated by Dr. Lovena Le and started on Flecainide 75 mg twice daily. Her episodes have resolved and she reports only very brief palpitations but these do not resemble her prior episodes of atrial fibrillation. I will  send a staff message to Dr. Lovena Le today to see if he wishes for her to have a stress test given initiation of Flecainide. She has now been on this for 2 months and has overall tolerated well. No longer on Lopressor given bradycardia. She does have an Rx for PRN Cardizem 30mg  but has not had to utilize this.  - She denies any evidence of active bleeding.  Remains on Eliquis 5 mg twice daily for anticoagulation. Hemoglobin was stable at 12.1 by recent labs in 05/2020.  2. HTN - BP is elevated at 160/68 during today's visit with similar values on recheck. She did develop hypotension following initiation of Losartan 50 mg daily by her PCP but says her SBP was in the 130's when taking 25 mg daily. I did recommend that she restart Losartan 25 mg daily given her readings. Will request a copy of lab work from her PCP as she reports this was obtained since her most recent Emergency Department evaluation. Continue Triamterene-HCTZ 75-50 mg daily.   3.  Lower Extremity Edema - She is currently on Lasix 40 mg daily along with Triamterene-HCTZ 75-50 mg daily. Dual diuretic therapy is not ideal but she reports having worsening edema when not taking Lasix and blood pressure was previously significantly elevated when HCTZ was discontinued in the past. She did have recent labs by her PCP and will request a copy. Creatinine was previously 1.43 in 05/2020.  4. HLD - Followed by PCP and she is currently on Simvastatin 40 mg daily. Would not further titrate dosing given multiple drug interactions with higher doses of Simvastatin.  5. Snoring/Daytime Somnolence - She is being followed by Neurology and underwent a home sleep study earlier this week. Results currently pending.  Medication Adjustments/Labs and Tests Ordered: Current medicines are reviewed at length with the patient today.  Concerns regarding medicines are outlined above.  Medication changes, Labs and Tests ordered today are listed in the Patient  Instructions below. Patient Instructions  Medication Instructions:  Your physician has recommended you make the following change in your medication:   Restart Losartan 25 mg Daily   *If you need a refill on your cardiac medications before your next appointment, please call your pharmacy*   Lab Work: NONE   If you have labs (blood work) drawn today and your tests are completely normal, you will receive your results only by: Marland Kitchen MyChart Message (if you have MyChart) OR . A paper copy in the mail If you have any lab test that is abnormal or we need to change your treatment, we will call you to review the results.   Testing/Procedures: NONE   Follow-Up: At [facility], you and your health needs are our priority.  As part of our continuing mission to provide you with exceptional heart care, we have created designated Provider Care Teams.  These Care Teams include your primary Cardiologist (physician) and Advanced Practice Providers (APPs -  Physician Assistants and Nurse Practitioners) who all work together to provide you with the care you need, when you need it.  We recommend signing up for the patient portal called "MyChart".  Sign up information is provided on this After Visit Summary.  MyChart is used to connect with patients for Virtual Visits (Telemedicine).  Patients are able to view lab/test results, encounter notes, upcoming appointments, etc.  Non-urgent messages can be sent to your provider as well.   To learn more about what you can do with MyChart, go to [email or web address].    Your next appointment:   1 week(s)  The format for your next appointment:   In Person  Provider:   Cristopher Peru, MD   Other Instructions Thank you for choosing [facility]!    Signed, Erma Heritage, PA-C  08/03/2020 5:48 PM    [facility] [address] [place], [place] [address] Phone: [phone] Fax: [phone]

## 2020-08-03 NOTE — Patient Instructions (Signed)
Medication Instructions:  Your physician has recommended you make the following change in your medication:   Restart Losartan 25 mg Daily   *If you need a refill on your cardiac medications before your next appointment, please call your pharmacy*   Lab Work: NONE   If you have labs (blood work) drawn today and your tests are completely normal, you will receive your results only by: Marland Kitchen MyChart Message (if you have MyChart) OR . A paper copy in the mail If you have any lab test that is abnormal or we need to change your treatment, we will call you to review the results.   Testing/Procedures: NONE   Follow-Up: At [facility], you and your health needs are our priority.  As part of our continuing mission to provide you with exceptional heart care, we have created designated Provider Care Teams.  These Care Teams include your primary Cardiologist (physician) and Advanced Practice Providers (APPs -  Physician Assistants and Nurse Practitioners) who all work together to provide you with the care you need, when you need it.  We recommend signing up for the patient portal called "MyChart".  Sign up information is provided on this After Visit Summary.  MyChart is used to connect with patients for Virtual Visits (Telemedicine).  Patients are able to view lab/test results, encounter notes, upcoming appointments, etc.  Non-urgent messages can be sent to your provider as well.   To learn more about what you can do with MyChart, go to [email or web address].    Your next appointment:   1 week(s)  The format for your next appointment:   In Person  Provider:   Cristopher Peru, MD   Other Instructions Thank you for choosing [facility]!

## 2020-08-04 ENCOUNTER — Telehealth: Payer: Self-pay | Admitting: Student

## 2020-08-04 DIAGNOSIS — Z5181 Encounter for therapeutic drug level monitoring: Secondary | ICD-10-CM

## 2020-08-04 NOTE — Addendum Note (Signed)
Addended by: Barbarann Ehlers A on: 08/04/2020 03:10 PM   Modules accepted: Orders

## 2020-08-04 NOTE — Telephone Encounter (Signed)
    I reviewed with Dr. Lovena Le and he did recommend her having a GXT given the recent initiation of Flecainide. I called and reviewed this with the patient and she is in agreement to proceed. She is unsure if she will be able to walk on the treadmill for an extended period of time due to her breathing and we reviewed that she would not have to get to a target heart rate such as a test for ischemia but would want to see her EKG tracings with activity to assess for QRS widening.  Cathey, can you enter the order for her GXT (association is for Flecainide monitoring).   [name], can you please get her scheduled for a GXT within the next 1-2 weeks?  Thanks,  [place]

## 2020-08-04 NOTE — Telephone Encounter (Signed)
Orders placed, test scheduled for 08/12/20 at 1015 am, arrive at 945 am, NPO 4 hours prior to procedure   COVID test Wed 8/18 at 1105 am (Pt has apt with Dr.Taylor at 1015 am)     Instructions given to patient for gxt and COVID testing

## 2020-08-09 DIAGNOSIS — G4733 Obstructive sleep apnea (adult) (pediatric): Secondary | ICD-10-CM | POA: Insufficient documentation

## 2020-08-09 HISTORY — DX: Obstructive sleep apnea (adult) (pediatric): G47.33

## 2020-08-09 NOTE — Addendum Note (Signed)
Addended by: Larey Seat on: 08/09/2020 12:37 PM   Modules accepted: Orders

## 2020-08-09 NOTE — Procedures (Signed)
Patient Information     First Name: [name] Last Name: [name] ID: [id number]  Birth Date: Oct 14, 1946 Age: 74  Gender: Female  Referring Provider: Marya Amsler Taylor,MD BMI: 32.9 (W=178 lb, H=5' 2'')  Neck Circ.: Address: [address] '' [place]:  2   Sleep Study Information    Study Date: 08/01/20 S/H/A Version: 001.001.001.001 / 4.1.1528 / 77   History:     [name] [name] is a 74 year- old  African- American female patient and is seen upon Cardiology referral on 06/15/2020 from Dr. Crissie Sickles, MD.  Chief concern according to patient:  "I may need a sleep study to check causes for my atrial fibrillation'.   I have the pleasure of seeing [name] [name] today, a right -handed African American female with a possible sleep disorder.  She has a past medical history of GERD (gastroesophageal reflux disease), Gout, Hyperlipidemia, Hypertension, Hypokalemia, PAF (paroxysmal atrial fibrillation) ([place]), PUD (peptic ulcer disease), and Syncope. She had palpitations and was diagnosed with atrial fibrillation. Just Thursday night, she was bradycardic and went to the ED for near syncope.        Summary & Diagnosis:     Very mild obstructive sleep apnea at AHI 8.9/H was recorded and during REM sleep there was an AHI of 23.0/h. Sleep was fragmented. A positional depended apnea was not stated. Trend  to bradycardia was noted.   Recommendations:     This is mild OSA but REM sleep dependent apnea and would be treated with CPAP or BiPAP.   I recommend use of an autotitration device with a setting of 5-15 cm water, 2 cm EPR and heated humidity, with an interface of patient's choice.   Interpreting Physician: Mack Hook               Start Study Time: End Study Time: Total Recording Time:  11:44:45PM 7:59:26 AM 8 h, 36min  Total Sleep Time % REM of Sleep Time:  6 h, 40 min 12.5    Mean: 96 Minimum: 94 Maximum: 100  Mean of Desaturations Nadirs (%):   95  Oxygen Desat. %: 4-9 10-20 >20 Total  Events  Number Total  2 100.0  0 0.0  0 0.0  2 100.0  Oxygen Saturation: <90 <=88 <85 <80 <70  Duration (minutes): Sleep % 0.0 0.0 0.0 0.0 0.0 0.0 0.0 0.0 0.0 0.0     Respiratory Indices       Total Events REM NREM All Night  pRDI: pAHI: ODI:  33  33  2 23.0 23.0 0.0 8.3 8.3 0.6 8.9 8.9 0.5       Pulse Rate Statistics during Sleep (BPM)      Mean: 58 Minimum: 46 Maximum: 76    Oxygen Saturation Statistics   Sleep Summary   Indices are calculated using technically valid sleep time of  3 h, 43 min. pRDI/pAHI are calculated using 02 desaturations ? 3%                Sleep Stages Chart              pAHI=8.9                          Mild              Moderate                    Severe  5              15                    30 

## 2020-08-09 NOTE — Progress Notes (Signed)
Summary & Diagnosis:   Very mild obstructive sleep apnea at AHI 8.9/H was recorded and  during REM sleep there was an AHI of 23.0/h. Sleep was  fragmented. A positional depended apnea was not stated. Trend to  bradycardia was noted.   Recommendations:    This is mild OSA but REM sleep dependent apnea and would be  treated with CPAP or BiPAP.   I recommend use of an autotitration device with a setting of 5-15  cm water, 2 cm EPR and heated humidity, with an interface of  patient's choice.   Interpreting Physician: Asencion Partridge Jaxsun Ciampi,MD

## 2020-08-10 ENCOUNTER — Ambulatory Visit: Payer: Medicare HMO | Admitting: Internal Medicine

## 2020-08-10 ENCOUNTER — Other Ambulatory Visit (HOSPITAL_COMMUNITY)
Admission: RE | Admit: 2020-08-10 | Discharge: 2020-08-10 | Disposition: A | Discharge status: Home / Self Care | Payer: Medicare HMO | Source: Ambulatory Visit | Attending: Student | Admitting: Student

## 2020-08-10 ENCOUNTER — Encounter: Payer: Self-pay | Admitting: Internal Medicine

## 2020-08-10 ENCOUNTER — Other Ambulatory Visit: Payer: Self-pay

## 2020-08-10 VITALS — BP 138/70 | HR 68 | Ht 62.0 in | Wt 170.0 lb

## 2020-08-10 DIAGNOSIS — Z01812 Encounter for preprocedural laboratory examination: Secondary | ICD-10-CM | POA: Insufficient documentation

## 2020-08-10 DIAGNOSIS — I480 Paroxysmal atrial fibrillation: Secondary | ICD-10-CM

## 2020-08-10 DIAGNOSIS — Z20822 Contact with and (suspected) exposure to covid-19: Secondary | ICD-10-CM | POA: Diagnosis not present

## 2020-08-10 LAB — SARS CORONAVIRUS 2 (TAT 6-24 HRS): SARS Coronavirus 2: NEGATIVE

## 2020-08-10 NOTE — Progress Notes (Signed)
HPI [name] [name] returns today for followup of her atrial fib and HTN. She is a pleasant 74 yo woman with PAF, who I saw a couple of months ago. She was started on flecainide 75 mg twice daily. Her symptoms of atrial fib have essentially resolved. She denies chest pain or sob. She has an exercise test pending.  Allergies  Allergen Reactions  . Acetaminophen-Codeine Hives  . Allopurinol Hives  . Amlodipine     Felt she retained fluid in her chest   . Amoxicillin Hives    Did it involve swelling of the face/tongue/throat, SOB, or low BP? No Did it involve sudden or severe rash/hives, skin peeling, or any reaction on the inside of your mouth or nose? No Did you need to seek medical attention at a hospital or doctor's office? No When did it last happen?10 + years If all above answers are "NO", may proceed with cephalosporin use.   . Tramadol Hives     Current Outpatient Medications  Medication Sig Dispense Refill  . acetaminophen (TYLENOL) 500 MG tablet Take 1,000 mg by mouth every 6 (six) hours as needed for moderate pain.    Marland Kitchen apixaban (ELIQUIS) 5 MG TABS tablet Take 1 tablet (5 mg total) by mouth 2 (two) times daily. 180 tablet 3  . APPLE CIDER VINEGAR PO Take 2 capsules by mouth daily.    . cetirizine (ZYRTEC) 10 MG tablet Take 10 mg by mouth daily as needed for allergies.    Marland Kitchen colchicine 0.6 MG tablet Take 0.6 mg by mouth daily.    . cyproheptadine (PERIACTIN) 4 MG tablet Take 4 mg by mouth 3 (three) times daily as needed for allergies.    Marland Kitchen diclofenac Sodium (VOLTAREN) 1 % GEL Apply 1 application topically 4 (four) times daily as needed (pain).    Marland Kitchen diltiazem (CARDIZEM) 30 MG tablet Take 1 tablet (30 mg total) by mouth daily as needed (palpitations). 30 tablet 1  . ELDERBERRY PO Take 2 capsules by mouth daily.     . flecainide (TAMBOCOR) 150 MG tablet Take 0.5 tablets (75 mg total) by mouth 2 (two) times daily. 90 tablet 3  . fluticasone (FLONASE) 50 MCG/ACT nasal spray  Place 1 spray into both nostrils daily as needed for allergies or rhinitis.    . Fluticasone-Salmeterol (ADVAIR) 500-50 MCG/DOSE AEPB Inhale 1 puff into the lungs every 12 (twelve) hours.    . furosemide (LASIX) 40 MG tablet Take 1 tablet (40 mg total) by mouth daily. 90 tablet 3  . hydrocortisone (PROCTOSOL HC) 2.5 % rectal cream PLACE RECTALLY 2 (TWO) TIMES DAILY. NO MORE THAN 14 DAYS AT A TIME. (Patient taking differently: Place 1 application rectally 2 (two) times daily as needed for hemorrhoids. ) 28.35 g 3  . losartan (COZAAR) 25 MG tablet Take 12.5 mg by mouth daily.    Marland Kitchen lubiprostone (AMITIZA) 24 MCG capsule TAKE 1 CAPSULE TWICE DAILY WITH A MEAL (Patient taking differently: Take 24 mcg by mouth in the morning and at bedtime. ) 180 capsule 3  . meclizine (ANTIVERT) 25 MG tablet Take 25 mg by mouth 3 (three) times daily as needed for dizziness.     Marland Kitchen omeprazole (PRILOSEC) 40 MG capsule Take 40 mg by mouth daily.      . potassium chloride SA (KLOR-CON) 20 MEQ tablet Take 20 mEq by mouth in the morning, at noon, and at bedtime.    . simvastatin (ZOCOR) 40 MG tablet Take 40 mg by  mouth daily in the afternoon.     . triamterene-hydrochlorothiazide (MAXZIDE) 75-50 MG per tablet Take 1 tablet by mouth daily.    . vitamin C (ASCORBIC ACID) 250 MG tablet Take 500 mg by mouth daily.     No current facility-administered medications for this visit.     Past Medical History:  Diagnosis Date  . GERD (gastroesophageal reflux disease)   . Gout   . Hyperlipidemia   . Hypertension   . Hypokalemia   . PAF (paroxysmal atrial fibrillation) ([place])   . PUD (peptic ulcer disease)   . Syncope    Neurocardiogenic    ROS:   All systems reviewed and negative except as noted in the HPI.   Past Surgical History:  Procedure Laterality Date  . ABDOMINAL HYSTERECTOMY    . CATARACT EXTRACTION Bilateral   . COLONOSCOPY N/A 10/07/2014   Dr. Oneida Alar: redundant left colon, moderate sized external hemorrhoids    . COLONOSCOPY N/A 03/11/2020   Procedure: COLONOSCOPY;  Surgeon: Danie Binder, MD;  [facility];  Service: Endoscopy;  Laterality: N/A;  9:30am w/ overtube  . LIPOMA RESECTION    . POLYPECTOMY  03/11/2020   Procedure: POLYPECTOMY;  Surgeon: Danie Binder, MD;  Location: [facility];  Service: Endoscopy;;  cecal, transverse, descending, sigmoid     Family History  Problem Relation Age of Onset  . Stroke Mother   . Dementia Father   . Cancer - Other Sister   . Atrial fibrillation Sister   . Heart failure Sister      Social History   Socioeconomic History  . Marital status: Single    Spouse name: Not on file  . Number of children: Not on file  . Years of education: Not on file  . Highest education level: Not on file  Occupational History  . Occupation: Retired  Tobacco Use  . Smoking status: Former Smoker    Packs/day: 1.00    Years: 20.00    Pack years: 20.00    Types: Cigarettes    Start date: 03/16/1964    Quit date: 10/07/1997    Years since quitting: 22.8  . Smokeless tobacco: Never Used  Vaping Use  . Vaping Use: Never used  Substance and Sexual Activity  . Alcohol use: No    Alcohol/week: 0.0 standard drinks  . Drug use: No  . Sexual activity: Yes    Partners: Male  Other Topics Concern  . Not on file  Social History Narrative  . Not on file   Social Determinants of Health   Financial Resource Strain:   . Difficulty of Paying Living Expenses:   Food Insecurity:   . Worried About Charity fundraiser in the Last Year:   . Arboriculturist in the Last Year:   Transportation Needs:   . Film/video editor (Medical):   Marland Kitchen Lack of Transportation (Non-Medical):   Physical Activity:   . Days of Exercise per Week:   . Minutes of Exercise per Session:   Stress:   . Feeling of Stress :   Social Connections:   . Frequency of Communication with Friends and Family:   . Frequency of Social Gatherings with Friends and Family:   . Attends  Religious Services:   . Active Member of Clubs or Organizations:   . Attends Archivist Meetings:   Marland Kitchen Marital Status:   Intimate Partner Violence:   . Fear of Current or Ex-Partner:   . Emotionally  Abused:   Marland Kitchen Physically Abused:   . Sexually Abused:      BP 138/70   Pulse 68   Ht 5\' 2"  (1.575 m)   Wt 170 lb (77.1 kg)   SpO2 98%   BMI 31.09 kg/m   Physical Exam:  Well appearing NAD HEENT: Unremarkable Neck:  No JVD, no thyromegally Lymphatics:  No adenopathy Back:  No CVA tenderness Lungs:  Clear with no wheezes HEART:  Regular rate rhythm, no murmurs, no rubs, no clicks Abd:  soft, positive bowel sounds, no organomegally, no rebound, no guarding Ext:  2 plus pulses, no edema, no cyanosis, no clubbing Skin:  No rashes no nodules Neuro:  CN II through XII intact, motor grossly intact  EKG - nsr with LVH  Assess/Plan: 1. Atrial fib - her symptoms are much improved on flecainide. She will continue her current meds. An exercise test is pending. 2. HTN - her bp is slightly up. She is encouraged to avoid salty foods.  3. Obesity - she is a bit overweight and I encouraged her to lose 15 lbs.   Carleene Overlie Marvion Bastidas,MD

## 2020-08-10 NOTE — Patient Instructions (Signed)
Medication Instructions:  Your physician recommends that you continue on your current medications as directed. Please refer to the Current Medication list given to you today.  *If you need a refill on your cardiac medications before your next appointment, please call your pharmacy*   Lab Work: NONE   If you have labs (blood work) drawn today and your tests are completely normal, you will receive your results only by:  [facility] (if you have MyChart) OR  A paper copy in the mail If you have any lab test that is abnormal or we need to change your treatment, we will call you to review the results.   Testing/Procedures: NONE   Follow-Up: At [facility], you and your health needs are our priority.  As part of our continuing mission to provide you with exceptional heart care, we have created designated Provider Care Teams.  These Care Teams include your primary Cardiologist (physician) and Advanced Practice Providers (APPs -  Physician Assistants and Nurse Practitioners) who all work together to provide you with the care you need, when you need it.  We recommend signing up for the patient portal called "MyChart".  Sign up information is provided on this After Visit Summary.  MyChart is used to connect with patients for Virtual Visits (Telemedicine).  Patients are able to view lab/test results, encounter notes, upcoming appointments, etc.  Non-urgent messages can be sent to your provider as well.   To learn more about what you can do with MyChart, go to [email or web address].    Your next appointment:   6 month(s)  The format for your next appointment:   In Person  Provider:   Cristopher Peru, MD   Other Instructions Your Provider suggest that you lose 10 pounds   Thank you for choosing [facility]!

## 2020-08-12 ENCOUNTER — Ambulatory Visit (HOSPITAL_COMMUNITY)
Admission: RE | Admit: 2020-08-12 | Discharge: 2020-08-12 | Disposition: A | Discharge status: Home / Self Care | Payer: Medicare HMO | Source: Ambulatory Visit | Attending: Student | Admitting: Student

## 2020-08-12 DIAGNOSIS — I480 Paroxysmal atrial fibrillation: Secondary | ICD-10-CM | POA: Diagnosis not present

## 2020-08-12 DIAGNOSIS — Z79899 Other long term (current) drug therapy: Secondary | ICD-10-CM | POA: Diagnosis not present

## 2020-08-12 DIAGNOSIS — Z5181 Encounter for therapeutic drug level monitoring: Secondary | ICD-10-CM | POA: Diagnosis not present

## 2020-08-12 LAB — EXERCISE TOLERANCE TEST
Estimated workload: 4.6 METS
Exercise duration (min): 3 min
Exercise duration (sec): 36 s
MPHR: 164 {beats}/min
Peak HR: 94 {beats}/min
Percent HR: 64 %
RPE: 15
Rest HR: 59 {beats}/min

## 2020-08-15 ENCOUNTER — Telehealth: Payer: Self-pay | Admitting: Neurology

## 2020-08-15 NOTE — Telephone Encounter (Signed)
-----   Message from Larey Seat, MD sent at 08/09/2020 12:37 PM EDT ----- Summary & Diagnosis:   Very mild obstructive sleep apnea at AHI 8.9/H was recorded and  during REM sleep there was an AHI of 23.0/h. Sleep was  fragmented. A positional depended apnea was not stated. Trend to  bradycardia was noted.   Recommendations:    This is mild OSA but REM sleep dependent apnea and would be  treated with CPAP or BiPAP.   I recommend use of an autotitration device with a setting of 5-15  cm water, 2 cm EPR and heated humidity, with an interface of  patient's choice.   Interpreting Physician: Asencion Partridge Dohmeier,MD

## 2020-08-15 NOTE — Telephone Encounter (Signed)
I called pt. I advised pt that Dr. Brett Fairy reviewed their sleep study results and found that pt mild osa. Dr. Brett Fairy recommends that pt starts an auto CPAP. I reviewed PAP compliance expectations with the pt. Pt is agreeable to starting a CPAP. I advised pt that an order will be sent to a DME, [facility] and [facility] will call the pt within about one week after they file with the pt's insurance. [facility]) will show the pt how to use the machine, fit for masks, and troubleshoot the CPAP if needed. A follow up appt was made for insurance purposes with Ward Givens, NP on Nov 11,2021 at 11 am. Pt verbalized understanding to arrive 15 minutes early and bring their CPAP. A letter with all of this information in it will be mailed to the pt as a reminder. I verified with the pt that the address we have on file is correct. Pt verbalized understanding of results. Pt had no questions at this time but was encouraged to call back if questions arise. I have sent the order to [facility] and have received confirmation that they have received the order.

## 2020-09-01 ENCOUNTER — Telehealth: Payer: Self-pay | Admitting: Student

## 2020-09-01 NOTE — Telephone Encounter (Signed)
Please call re:BP   [phone]     thanks

## 2020-09-02 NOTE — Telephone Encounter (Signed)
Spoke with Pt who states she has been weak and was seen by PCP. Pt states she was told that she needs to be seen by a nephrologist d/t change in kidney numbers.

## 2020-09-11 ENCOUNTER — Other Ambulatory Visit: Payer: Self-pay

## 2020-09-11 ENCOUNTER — Emergency Department (HOSPITAL_COMMUNITY): Payer: Medicare HMO

## 2020-09-11 ENCOUNTER — Encounter (HOSPITAL_COMMUNITY): Payer: Self-pay | Admitting: Emergency Medicine

## 2020-09-11 ENCOUNTER — Emergency Department (HOSPITAL_COMMUNITY)
Admission: EM | Admit: 2020-09-11 | Discharge: 2020-09-11 | Disposition: A | Discharge status: Home / Self Care | Payer: Medicare HMO | Attending: Emergency Medicine | Admitting: Emergency Medicine

## 2020-09-11 DIAGNOSIS — R001 Bradycardia, unspecified: Secondary | ICD-10-CM

## 2020-09-11 DIAGNOSIS — Z79899 Other long term (current) drug therapy: Secondary | ICD-10-CM | POA: Diagnosis not present

## 2020-09-11 DIAGNOSIS — I10 Essential (primary) hypertension: Secondary | ICD-10-CM | POA: Insufficient documentation

## 2020-09-11 DIAGNOSIS — R002 Palpitations: Secondary | ICD-10-CM | POA: Diagnosis present

## 2020-09-11 DIAGNOSIS — D649 Anemia, unspecified: Secondary | ICD-10-CM | POA: Insufficient documentation

## 2020-09-11 DIAGNOSIS — Z87891 Personal history of nicotine dependence: Secondary | ICD-10-CM | POA: Insufficient documentation

## 2020-09-11 DIAGNOSIS — Z7901 Long term (current) use of anticoagulants: Secondary | ICD-10-CM | POA: Insufficient documentation

## 2020-09-11 DIAGNOSIS — N289 Disorder of kidney and ureter, unspecified: Secondary | ICD-10-CM | POA: Diagnosis not present

## 2020-09-11 LAB — BASIC METABOLIC PANEL
Anion gap: 11 (ref 5–15)
BUN: 33 mg/dL — ABNORMAL HIGH (ref 8–23)
CO2: 30 mmol/L (ref 22–32)
Calcium: 9.7 mg/dL (ref 8.9–10.3)
Chloride: 91 mmol/L — ABNORMAL LOW (ref 98–111)
Creatinine, Ser: 2.6 mg/dL — ABNORMAL HIGH (ref 0.44–1.00)
GFR calc Af Amer: 20 mL/min — ABNORMAL LOW (ref >60)
GFR calc non Af Amer: 17 mL/min — ABNORMAL LOW (ref >60)
Glucose, Bld: 109 mg/dL — ABNORMAL HIGH (ref 70–99)
Potassium: 3.7 mmol/L (ref 3.5–5.1)
Sodium: 132 mmol/L — ABNORMAL LOW (ref 135–145)

## 2020-09-11 LAB — POC OCCULT BLOOD, ED: Fecal Occult Bld: NEGATIVE

## 2020-09-11 LAB — TROPONIN I (HIGH SENSITIVITY)
Troponin I (High Sensitivity): 19 ng/L — ABNORMAL HIGH (ref <18)
Troponin I (High Sensitivity): 19 ng/L — ABNORMAL HIGH (ref <18)

## 2020-09-11 LAB — CBC
HCT: 30.9 % — ABNORMAL LOW (ref 36.0–46.0)
Hemoglobin: 9.5 g/dL — ABNORMAL LOW (ref 12.0–15.0)
MCH: 25.9 pg — ABNORMAL LOW (ref 26.0–34.0)
MCHC: 30.7 g/dL (ref 30.0–36.0)
MCV: 84.2 fL (ref 80.0–100.0)
Platelets: 393 10*3/uL (ref 150–400)
RBC: 3.67 MIL/uL — ABNORMAL LOW (ref 3.87–5.11)
RDW: 14.6 % (ref 11.5–15.5)
WBC: 6.3 10*3/uL (ref 4.0–10.5)
nRBC: 0 % (ref 0.0–0.2)

## 2020-09-11 LAB — D-DIMER, QUANTITATIVE: D-Dimer, Quant: 0.4 ug/mL-FEU (ref 0.00–0.50)

## 2020-09-11 NOTE — Discharge Instructions (Addendum)
Please follow-up with gastroenterology for your new anemia.  You actually did have 9 polyps removed on your colonoscopy in March.  These can cause bleeding especially if your Eliquis.  I am not sure who took over Dr. Oneida Alar practice but you can follow-up with Dr. Gala Romney or call her office and see who the new doctor is in follow-up with them.  Please contact Dr. Domenic Polite to let them know that your kidney function has gotten a little worse with the change in your Lasix dosage.  Please do not take any extra diltiazem unless your heart rate is greater than 80 and you are having symptoms.]

## 2020-09-11 NOTE — ED Provider Notes (Signed)
[facility] EMERGENCY DEPARTMENT Provider Note   CSN: [id number] Arrival date & time: 09/11/20  0039   Time seen 6:27 AM  History Chief Complaint  Patient presents with  . Shortness of Breath    irregular pulse rate    [name] [name] is a 74 y.o. female.  HPI   Patient states she has a history of atrial fibrillation.  She states about 7 PM last night she started having palpitations and felt skipping of her heart but not really racing.  Made her feel short of breath but she denies chest pain.  Sometimes she feels lightheaded and dizzy when she stands.  She states she has felt this way before when she had her atrial fib.  She took diltiazem at 8 PM and laid down and felt little bit better however about 10 PM she thought it started again.  She states now she just feels a little bit weak.  She states she had a colonoscopy in June or July and it was normal.  She denies having any rectal bleeding, melena, abdominal pain, nausea, or vomiting.  Patient states she has Apple Watch that she normally can monitor her heart rate however it was broken and she has a new when they have not set up yet.  PCP Andres Shad, MD Cardiology Dr Lovena Le and Gwen Her  Past Medical History:  Diagnosis Date  . GERD (gastroesophageal reflux disease)   . Gout   . Hyperlipidemia   . Hypertension   . Hypokalemia   . PAF (paroxysmal atrial fibrillation) ([place])   . PUD (peptic ulcer disease)   . Syncope    Neurocardiogenic    Patient Active Problem List   Diagnosis Date Noted  . OSA (obstructive sleep apnea) 08/09/2020  . Paroxysmal atrial fibrillation ([place]) 06/15/2020  . Bradycardia with 31-40 beats per minute 06/15/2020  . Snoring 06/15/2020  . Intermittent palpitations 06/15/2020  . Near syncope 06/15/2020  . Hypertension, uncontrolled 06/15/2020  . Atrial fibrillation with RVR ([place]) 05/04/2020  . Hyperlipidemia   . Gout   . Encounter for screening colonoscopy   . Hemorrhoid 12/16/2018  .  GERD (gastroesophageal reflux disease) 01/29/2018  . Constipation 07/17/2017  . Rectal bleeding 01/30/2017  . Chest pain 12/09/2013  . Renal insufficiency 05/01/2012  . Essential hypertension, benign 05/03/2010  . VASOVAGAL SYNCOPE 05/03/2010    Past Surgical History:  Procedure Laterality Date  . ABDOMINAL HYSTERECTOMY    . CATARACT EXTRACTION Bilateral   . COLONOSCOPY N/A 10/07/2014   Dr. Oneida Alar: redundant left colon, moderate sized external hemorrhoids   . COLONOSCOPY N/A 03/11/2020   Procedure: COLONOSCOPY;  Surgeon: Danie Binder, MD;  [facility];  Service: Endoscopy;  Laterality: N/A;  9:30am w/ overtube  . LIPOMA RESECTION    . POLYPECTOMY  03/11/2020   Procedure: POLYPECTOMY;  Surgeon: Danie Binder, MD;  [facility];  Service: Endoscopy;;  cecal, transverse, descending, sigmoid     OB History   No obstetric history on file.     Family History  Problem Relation Age of Onset  . Stroke Mother   . Dementia Father   . Cancer - Other Sister   . Atrial fibrillation Sister   . Heart failure Sister     Social History   Tobacco Use  . Smoking status: Former Smoker    Packs/day: 1.00    Years: 20.00    Pack years: 20.00    Types: Cigarettes    Start date:  03/16/1964    Quit date: 10/07/1997    Years since quitting: 22.9  . Smokeless tobacco: Never Used  Vaping Use  . Vaping Use: Never used  Substance Use Topics  . Alcohol use: No    Alcohol/week: 0.0 standard drinks  . Drug use: No    Home Medications Prior to Admission medications   Medication Sig Start Date End Date Taking? Authorizing Provider  acetaminophen (TYLENOL) 500 MG tablet Take 1,000 mg by mouth every 6 (six) hours as needed for moderate pain.    [provider]  apixaban (ELIQUIS) 5 MG TABS tablet Take 1 tablet (5 mg total) by mouth 2 (two) times daily. 07/25/20 07/20/21  Satira Sark, MD  APPLE CIDER VINEGAR PO Take 2 capsules by mouth daily.    [provider]  cetirizine (ZYRTEC) 10 MG tablet Take 10 mg by mouth daily as needed for allergies.    [provider]  colchicine 0.6 MG tablet Take 0.6 mg by mouth daily. 01/31/20   [provider]  cyproheptadine (PERIACTIN) 4 MG tablet Take 4 mg by mouth 3 (three) times daily as needed for allergies.    [provider]  diclofenac Sodium (VOLTAREN) 1 % GEL Apply 1 application topically 4 (four) times daily as needed (pain).    [provider]  diltiazem (CARDIZEM) 30 MG tablet Take 1 tablet (30 mg total) by mouth daily as needed (palpitations). 05/05/20   Johnson, Clanford L, MD  ELDERBERRY PO Take 2 capsules by mouth daily.     [provider]  flecainide (TAMBOCOR) 150 MG tablet Take 0.5 tablets (75 mg total) by mouth 2 (two) times daily. 05/27/20   Evans Lance, MD  fluticasone (FLONASE) 50 MCG/ACT nasal spray Place 1 spray into both nostrils daily as needed for allergies or rhinitis.    [provider]  Fluticasone-Salmeterol (ADVAIR) 500-50 MCG/DOSE AEPB Inhale 1 puff into the lungs every 12 (twelve) hours.    [provider]  furosemide (LASIX) 40 MG tablet Take 1 tablet (40 mg total) by mouth daily. 05/27/20 08/25/20  Evans Lance, MD  hydrocortisone (PROCTOSOL HC) 2.5 % rectal cream PLACE RECTALLY 2 (TWO) TIMES DAILY. NO MORE THAN 14 DAYS AT A TIME. Patient taking differently: Place 1 application rectally 2 (two) times daily as needed for hemorrhoids.  12/16/18   Mahala Menghini, PA-C  losartan (COZAAR) 25 MG tablet Take 12.5 mg by mouth daily.    [provider]  lubiprostone (AMITIZA) 24 MCG capsule TAKE 1 CAPSULE TWICE DAILY WITH A MEAL Patient taking differently: Take 24 mcg by mouth in the morning and at bedtime.  08/11/19   Mahala Menghini, PA-C  meclizine (ANTIVERT) 25 MG tablet Take 25 mg by mouth 3 (three) times daily as needed for dizziness.     [provider]  omeprazole (PRILOSEC) 40 MG capsule Take  40 mg by mouth daily.      [provider]  potassium chloride SA (KLOR-CON) 20 MEQ tablet Take 20 mEq by mouth in the morning, at noon, and at bedtime.    [provider]  simvastatin (ZOCOR) 40 MG tablet Take 40 mg by mouth daily in the afternoon.     [provider]  triamterene-hydrochlorothiazide (MAXZIDE) 75-50 MG per tablet Take 1 tablet by mouth daily.    [provider]  vitamin C (ASCORBIC ACID) 250 MG tablet Take 500 mg by mouth daily.    [provider]  Allergies    Acetaminophen-codeine, Allopurinol, Amlodipine, Amoxicillin, and Tramadol  Review of Systems   Review of Systems  All other systems reviewed and are negative.   Physical Exam Updated Vital Signs BP (!) 157/73   Pulse (!) 58   Temp 98.9 F (37.2 C) (Oral)   Resp 16   Ht 5\' 2"  (1.575 m)   Wt 76.2 kg   SpO2 100%   BMI 30.73 kg/m   Physical Exam Vitals and nursing note reviewed.  Constitutional:      General: She is not in acute distress.    Appearance: Normal appearance. She is ill-appearing. She is not toxic-appearing.  HENT:     Head: Normocephalic and atraumatic.     Right Ear: External ear normal.     Left Ear: External ear normal.  Eyes:     Extraocular Movements: Extraocular movements intact.     Conjunctiva/sclera: Conjunctivae normal.  Cardiovascular:     Rate and Rhythm: Normal rate and regular rhythm.     Pulses: Normal pulses.     Heart sounds: Normal heart sounds.  Pulmonary:     Effort: Pulmonary effort is normal. No respiratory distress.     Breath sounds: Normal breath sounds. No stridor. No wheezing, rhonchi or rales.  Musculoskeletal:        General: Normal range of motion.     Cervical back: Normal range of motion.     Right lower leg: No edema.     Left lower leg: No edema.  Skin:    General: Skin is warm and dry.  Neurological:     General: No focal deficit present.     Mental Status: She is alert and oriented to person,  place, and time.     Cranial Nerves: No cranial nerve deficit.  Psychiatric:        Mood and Affect: Mood normal.        Behavior: Behavior normal.        Thought Content: Thought content normal.     ED Results / Procedures / Treatments   Labs (all labs ordered are listed, but only abnormal results are displayed) Results for orders placed or performed during the hospital encounter of 09/11/20  CBC  Result Value Ref Range   WBC 6.3 4.0 - 10.5 K/uL   RBC 3.67 (L) 3.87 - 5.11 MIL/uL   Hemoglobin 9.5 (L) 12.0 - 15.0 g/dL   HCT 30.9 (L) 36 - 46 %   MCV 84.2 80.0 - 100.0 fL   MCH 25.9 (L) 26.0 - 34.0 pg   MCHC 30.7 30.0 - 36.0 g/dL   RDW 14.6 11.5 - 15.5 %   Platelets 393 150 - 400 K/uL   nRBC 0.0 0.0 - 0.2 %  Basic metabolic panel  Result Value Ref Range   Sodium 132 (L) 135 - 145 mmol/L   Potassium 3.7 3.5 - 5.1 mmol/L   Chloride 91 (L) 98 - 111 mmol/L   CO2 30 22 - 32 mmol/L   Glucose, Bld 109 (H) 70 - 99 mg/dL   BUN 33 (H) 8 - 23 mg/dL   Creatinine, Ser 2.60 (H) 0.44 - 1.00 mg/dL   Calcium 9.7 8.9 - 10.3 mg/dL   GFR calc non Af Amer 17 (L) >60 mL/min   GFR calc Af Amer 20 (L) >60 mL/min   Anion gap 11 5 - 15  D-dimer, quantitative  Result Value Ref Range   D-Dimer, Quant 0.40 0.00 - 0.50 ug/mL-FEU  Troponin I (  High Sensitivity)  Result Value Ref Range   Troponin I (High Sensitivity) 19 (H) <18 ng/L   Laboratory interpretation all normal except mildly elevated troponin which she has had in the past, mild worsening of her baseline renal insufficiency (patient states they changed her dose of Lasix recently) and new anemia since June 18.    EKG EKG Interpretation  Date/Time:  Sunday September 11 2020 01:28:25 EDT Ventricular Rate:  51 PR Interval:    QRS Duration: 120 QT Interval:  456 QTC Calculation: 420 R Axis:   3 Text Interpretation: Undetermined rhythm Cannot rule out Anterior infarct , age undetermined No significant change since last tracing 10 Jun 2020  Confirmed by Rolland Porter [phone]) on 09/11/2020 1:37:15 AM   Radiology DG Chest Portable 1 View  Result Date: 09/11/2020 CLINICAL DATA:  Short of breath, palpitations EXAM: PORTABLE CHEST 1 VIEW COMPARISON:  05/04/2020 FINDINGS: The heart size and mediastinal contours are within normal limits. Both lungs are clear. The visualized skeletal structures are unremarkable. IMPRESSION: No active disease. Electronically Signed   By: Randa Ngo M.D.   On: 09/11/2020 02:27    Procedures Procedures (including critical care time)   Colonoscopy performed March 11, 2020 by Dr. Carlyon Prows fields Nine sessile polyps were found in the sigmoid colon, mid descending colon, mid transverse colon and cecum. The polyps were 3 to 7 mm in size. These polyps were removed with a cold snare. Resection and retrieval were complete. Findings: A localized area of mildly nodular mucosa was found at the anus. Biopsies were taken with a cold forceps for histology. The recto-sigmoid colon, sigmoid colon and descending colon were moderately tortuous.  Medications Ordered in ED Medications - No data to display  ED Course  I have reviewed the triage vital signs and the nursing notes.  Pertinent labs & imaging results that were available during my care of the patient were reviewed by me and considered in my medical decision making (see chart for details).    MDM Rules/Calculators/A&P                          Patient has new onset of anemia.  She had a colonoscopy in March which showed 9 polyps that were removed.  She is on Eliquis but denies rectal bleeding.  This can be followed up as an outpatient.  She currently is not symptomatic enough where hemoglobin low enough to require transfusion.  We also discussed that her heart rate now is 60 and is in sinus rhythm.  She did have an apple watch where she could check her heart rate which was broken, she has a new when that needs to be set up.  She strongly encourage not to take  an extra diltiazem unless her heart rate is over 80 and she is symptomatic.  Patient is on Eliquis however she states she had a lot of shortness of breath tonight.  D-dimer was added to her blood work.  D-dimer was negative.  Patient's initial troponin was mildly elevated which is typical based on her prior ones.  Delta troponin is pending.    Patient states Dr. Domenic Polite recently changed her Lasix dose which may explain her mild worsening of her renal insufficiency.  She is advised to call his office on Monday about that..   Dr Reather Converse, hospitalist, will check delta troponin and Hemoccult.  Final Clinical Impression(s) / ED Diagnoses Final diagnoses:  Palpitations  Sinus bradycardia  Anemia, unspecified type  Renal insufficiency    Rx / DC Orders  Disposition pending  Rolland Porter, MD, Barbette Or, MD 09/11/20 [phone]

## 2020-09-11 NOTE — ED Triage Notes (Signed)
Patient states shortness of breath and palpitations that started earlier last night. Patient states that she took one diltiazem around 8 p.m. last night. Patient denies any chest pain.

## 2020-09-11 NOTE — ED Provider Notes (Signed)
Patient CARE signed out to follow-up repeat troponin and discharge with outpatient follow-up. Repeat troponin similar to previous less than 20. Patient not having any active chest pain or shortness of breath. Patient denies active bleeding. Hemoccult negative. Patient does have worsening anemia around 9 hemoglobin. Stressed the importance of follow-up with primary doctor and gastroenterology.  Golda Acre, MD 09/11/20 [phone]

## 2020-09-13 ENCOUNTER — Telehealth: Payer: Self-pay | Admitting: Student

## 2020-09-13 NOTE — Telephone Encounter (Signed)
    I have not spoke to the patient. It looks like she has sent MyChart messages and Alma Friendly has replied. It appears she was wanting a referral to a Nephrologist and this can be provided. Please follow-up with the patient in regards to what other concerns she has as I am out of the office today.   Thanks,  [place]

## 2020-09-13 NOTE — Telephone Encounter (Signed)
Patient has apt with Dr.Bhutani in 3 days. She states she was seen in the ED over the weekend and was told to follow up in [place], you are booking 11/22 and beyond.please advise.

## 2020-09-13 NOTE — Telephone Encounter (Signed)
New message     Patient called said she was in hospital this weekend and that she has been in contact with [place], I offered patient hospital follow up in [place] and she declined said that she wanted to speak with [place] before making any appointments

## 2020-09-14 NOTE — Telephone Encounter (Signed)
Contacted to offer appt wth Katina Dung NP on 09/26/20 but patient declined Says she is scheduled to see Theador Hawthorne on Friday this week

## 2020-09-15 ENCOUNTER — Encounter: Payer: Self-pay | Admitting: Internal Medicine

## 2020-09-15 ENCOUNTER — Other Ambulatory Visit: Payer: Self-pay

## 2020-09-15 ENCOUNTER — Ambulatory Visit: Payer: Medicare HMO | Admitting: Internal Medicine

## 2020-09-15 VITALS — BP 186/73 | HR 83 | Temp 97.5°F | Ht 62.0 in | Wt 165.6 lb

## 2020-09-15 DIAGNOSIS — K5900 Constipation, unspecified: Secondary | ICD-10-CM

## 2020-09-15 DIAGNOSIS — D369 Benign neoplasm, unspecified site: Secondary | ICD-10-CM

## 2020-09-15 DIAGNOSIS — D649 Anemia, unspecified: Secondary | ICD-10-CM | POA: Diagnosis not present

## 2020-09-15 MED ORDER — LUBIPROSTONE 24 MCG PO CAPS: 24.0000 ug | ORAL_CAPSULE | Freq: Two times a day (BID) | ORAL | 3 refills | Status: DC

## 2020-09-15 NOTE — Telephone Encounter (Signed)
     I called and spoke to the patient following her recent ED visit. She denies any recurrent palpitations and feels back to baseline at this time. She is scheduled to see Nephrology tomorrow in regards to her AKI. She was previously taking Lasix 40 mg daily and has reduced this to 20 mg every other day since her ED visit and denies any recurrent edema. I did encourage her to follow daily weights. She had scheduled a follow-up visit in [place] in 09/2020 but wishes to cancel it at this time. I encouraged her to make [place] aware if she has any recurrent symptoms or issues with palpitations or fluid retention as we can reschedule follow-up. She voiced understanding of this and was appreciative of the return call.  Signed, Erma Heritage, PA-C 09/15/2020, 5:11 PM Pager: [phone]

## 2020-09-15 NOTE — Patient Instructions (Signed)
Continue on Amitiza for chronic constipation.  I sent in a year supply of refills to your mail in pharmacy.  If there are any issues with insurance covering, please call our office and we will do our best to take care of this.    Continue on omeprazole as needed for your reflux.  Follow up with me in 6 months or sooner if needed.   Lifestyle and home remedies TO MANAGE REFLUX/HEARTBURN    You may eliminate or reduce the frequency of heartburn by making the following lifestyle changes:    Control your weight. Being overweight is a major risk factor for heartburn and GERD. Excess pounds put pressure on your abdomen, pushing up your stomach and causing acid to back up into your esophagus.     Eat smaller meals. 4 TO 6 MEALS A DAY. This reduces pressure on the lower esophageal sphincter, helping to prevent the valve from opening and acid from washing back into your esophagus.      Loosen your belt. Clothes that fit tightly around your waist put pressure on your abdomen and the lower esophageal sphincter.      Eliminate heartburn triggers. Everyone has specific triggers. Common triggers such as fatty or fried foods, spicy food, tomato sauce, carbonated beverages, alcohol, chocolate, mint, garlic, onion, caffeine and nicotine may make heartburn worse.     Avoid stooping or bending. Tying your shoes is OK. Bending over for longer periods to weed your garden isn't, especially soon after eating.     Don't lie down after a meal. Wait at least three to four hours after eating before going to bed, and don't lie down right after eating.    At [facility] we value your feedback. You may receive a survey about your visit today. Please share your experience as we strive to create trusting relationships with our patients to provide genuine, compassionate, quality care.  We appreciate your understanding and patience as we review any laboratory studies, imaging, and other diagnostic  tests that are ordered as we care for you. Our office policy is 5 business days for review of these results, and any emergent or urgent results are addressed in a timely manner for your best interest. If you do not hear from our office in 1 week, please contact us.   We also encourage the use of MyChart, which contains your medical information for your review as well. If you are not enrolled in this feature, an access code is on this after visit summary for your convenience. Thank you for allowing [place] to be involved in your care.  It was great to see you today!  I hope you have a great rest of your fall!!    Shayon Trompeter K. Abbey Chatters, D.O. Gastroenterology and Hepatology [facility]

## 2020-09-15 NOTE — Progress Notes (Signed)
Referring Provider: Andres Shad, * Primary Care Physician:  Andres Shad, MD Primary GI:  Dr. Abbey Chatters  Chief Complaint  Patient presents with  . Gastroesophageal Reflux    f/u. Doing fine  . Constipation    f/u. BM's daily, not straining    HPI:   [name] [name] is a very pleasant 74 y.o. female who presents to the clinic today for follow-up visit.  She is accompanied by her sister.  She has chronic constipation which is currently well maintained on Amitiza 24 mcg twice daily.  She has tried other modalities in the past and failed.  Amitiza has worked the best.  She is requesting refills today.  She has intermittent reflux which is also well controlled on omeprazole 40 mg.  She was recently seen in in the ER for palpitations due to A. fib.  She does chronically take Eliquis.  It was found that her hemoglobin had actually dropped from 12 to 9.  She does have what appears to be chronic kidney disease with most recent creatinine 2.6.  She has appointment with nephrology tomorrow.  Most recent colonoscopy March 2021 showed 9 polyps, majority of which were tubular adenomas.  Repeat colonoscopy has been deferred due to age and comorbidities.  Otherwise patient has no other complaints for me today.  Past Medical History:  Diagnosis Date  . GERD (gastroesophageal reflux disease)   . Gout   . Hyperlipidemia   . Hypertension   . Hypokalemia   . PAF (paroxysmal atrial fibrillation) ([place])   . PUD (peptic ulcer disease)   . Syncope    Neurocardiogenic    Past Surgical History:  Procedure Laterality Date  . ABDOMINAL HYSTERECTOMY    . CATARACT EXTRACTION Bilateral   . COLONOSCOPY N/A 10/07/2014   Dr. Oneida Alar: redundant left colon, moderate sized external hemorrhoids   . COLONOSCOPY N/A 03/11/2020   Procedure: COLONOSCOPY;  Surgeon: Danie Binder, MD;  Location: [facility];  Service: Endoscopy;  Laterality: N/A;  9:30am w/ overtube  . LIPOMA RESECTION    .  POLYPECTOMY  03/11/2020   Procedure: POLYPECTOMY;  Surgeon: Danie Binder, MD;  Location: [facility];  Service: Endoscopy;;  cecal, transverse, descending, sigmoid    Current Outpatient Medications  Medication Sig Dispense Refill  . acetaminophen (TYLENOL) 500 MG tablet Take 1,000 mg by mouth every 6 (six) hours as needed for moderate pain.    Marland Kitchen apixaban (ELIQUIS) 5 MG TABS tablet Take 1 tablet (5 mg total) by mouth 2 (two) times daily. 180 tablet 3  . APPLE CIDER VINEGAR PO Take 2 capsules by mouth daily.    . cetirizine (ZYRTEC) 10 MG tablet Take 10 mg by mouth daily as needed for allergies.    Marland Kitchen colchicine 0.6 MG tablet Take 0.6 mg by mouth daily.    . cyproheptadine (PERIACTIN) 4 MG tablet Take 4 mg by mouth 3 (three) times daily as needed for allergies.    Marland Kitchen diclofenac Sodium (VOLTAREN) 1 % GEL Apply 1 application topically 4 (four) times daily as needed (pain).    Marland Kitchen diltiazem (CARDIZEM) 30 MG tablet Take 1 tablet (30 mg total) by mouth daily as needed (palpitations). 30 tablet 1  . ELDERBERRY PO Take 2 capsules by mouth daily.     . flecainide (TAMBOCOR) 150 MG tablet Take 0.5 tablets (75 mg total) by mouth 2 (two) times daily. 90 tablet 3  . fluticasone (FLONASE) 50 MCG/ACT nasal spray Place 1 spray into  both nostrils daily as needed for allergies or rhinitis.    . Fluticasone-Salmeterol (ADVAIR) 500-50 MCG/DOSE AEPB Inhale 1 puff into the lungs every 12 (twelve) hours.    . furosemide (LASIX) 40 MG tablet Take 1 tablet (40 mg total) by mouth daily. 90 tablet 3  . hydrocortisone (PROCTOSOL HC) 2.5 % rectal cream PLACE RECTALLY 2 (TWO) TIMES DAILY. NO MORE THAN 14 DAYS AT A TIME. (Patient taking differently: Place 1 application rectally 2 (two) times daily as needed for hemorrhoids. ) 28.35 g 3  . losartan (COZAAR) 25 MG tablet Take 12.5 mg by mouth daily.    Marland Kitchen lubiprostone (AMITIZA) 24 MCG capsule Take 1 capsule (24 mcg total) by mouth in the morning and at bedtime. 180 capsule 3  .  meclizine (ANTIVERT) 25 MG tablet Take 25 mg by mouth 3 (three) times daily as needed for dizziness.     Marland Kitchen omeprazole (PRILOSEC) 40 MG capsule Take 40 mg by mouth daily.      . potassium chloride SA (KLOR-CON) 20 MEQ tablet Take 20 mEq by mouth in the morning, at noon, and at bedtime.    . simvastatin (ZOCOR) 40 MG tablet Take 40 mg by mouth daily in the afternoon.     . triamterene-hydrochlorothiazide (MAXZIDE) 75-50 MG per tablet Take 1 tablet by mouth daily.    . vitamin C (ASCORBIC ACID) 250 MG tablet Take 500 mg by mouth daily.     No current facility-administered medications for this visit.    Allergies as of 09/15/2020 - Review Complete 09/15/2020  Allergen Reaction Noted  . Acetaminophen-codeine Hives   . Allopurinol Hives   . Amlodipine  08/03/2020  . Amoxicillin Hives 03/04/2020  . Tramadol Hives     Family History  Problem Relation Age of Onset  . Stroke Mother   . Dementia Father   . Cancer - Other Sister   . Atrial fibrillation Sister   . Heart failure Sister     Social History   Socioeconomic History  . Marital status: Single    Spouse name: Not on file  . Number of children: Not on file  . Years of education: Not on file  . Highest education level: Not on file  Occupational History  . Occupation: Retired  Tobacco Use  . Smoking status: Former Smoker    Packs/day: 1.00    Years: 20.00    Pack years: 20.00    Types: Cigarettes    Start date: 03/16/1964    Quit date: 10/07/1997    Years since quitting: 22.9  . Smokeless tobacco: Never Used  Vaping Use  . Vaping Use: Never used  Substance and Sexual Activity  . Alcohol use: No    Alcohol/week: 0.0 standard drinks  . Drug use: No  . Sexual activity: Yes    Partners: Male  Other Topics Concern  . Not on file  Social History Narrative  . Not on file   Social Determinants of Health   Financial Resource Strain:   . Difficulty of Paying Living Expenses: Not on file  Food Insecurity:   . Worried  About Charity fundraiser in the Last Year: Not on file  . Ran Out of Food in the Last Year: Not on file  Transportation Needs:   . Lack of Transportation (Medical): Not on file  . Lack of Transportation (Non-Medical): Not on file  Physical Activity:   . Days of Exercise per Week: Not on file  . Minutes of Exercise per  Session: Not on file  Stress:   . Feeling of Stress : Not on file  Social Connections:   . Frequency of Communication with Friends and Family: Not on file  . Frequency of Social Gatherings with Friends and Family: Not on file  . Attends Religious Services: Not on file  . Active Member of Clubs or Organizations: Not on file  . Attends Archivist Meetings: Not on file  . Marital Status: Not on file    Subjective: Review of Systems  Constitutional: Negative for chills and fever.  HENT: Negative for congestion and hearing loss.   Eyes: Negative for blurred vision and double vision.  Respiratory: Negative for cough and shortness of breath.   Cardiovascular: Negative for chest pain and palpitations.  Gastrointestinal: Negative for abdominal pain, blood in stool, constipation, diarrhea, heartburn, melena and vomiting.  Genitourinary: Negative for dysuria and urgency.  Musculoskeletal: Negative for joint pain and myalgias.  Skin: Negative for itching and rash.  Neurological: Negative for dizziness and headaches.  Psychiatric/Behavioral: Negative for depression. The patient is not nervous/anxious.      Objective: BP (!) 186/73   Pulse 83   Temp (!) 97.5 F (36.4 C) (Oral)   Ht 5\' 2"  (1.575 m)   Wt 165 lb 9.6 oz (75.1 kg)   BMI 30.29 kg/m  Physical Exam Constitutional:      Appearance: Normal appearance.  HENT:     Head: Normocephalic and atraumatic.  Eyes:     Extraocular Movements: Extraocular movements intact.     Conjunctiva/sclera: Conjunctivae normal.  Cardiovascular:     Rate and Rhythm: Normal rate and regular rhythm.  Pulmonary:      Effort: Pulmonary effort is normal.     Breath sounds: Normal breath sounds.  Abdominal:     General: Bowel sounds are normal.     Palpations: Abdomen is soft.  Musculoskeletal:        General: No swelling. Normal range of motion.     Cervical back: Normal range of motion and neck supple.  Skin:    General: Skin is warm and dry.     Coloration: Skin is not jaundiced.  Neurological:     General: No focal deficit present.     Mental Status: She is alert and oriented to person, place, and time.  Psychiatric:        Mood and Affect: Mood normal.        Behavior: Behavior normal.      Assessment: *Chronic constipation-well controlled on current dose of Amitiza *History of adenomatous colon polyps *Anemia-etiology unclear, doubt GI occult blood loss *Chronic reflux-well controlled on omeprazole  Plan: -Patient's chronic constipation appears to be well controlled on current dose of Amitiza.  She has tried and failed other modalities in the past.  We will continue on Amitiza twice daily.  Refills sent in today. -Patient with history of adenomatous colon polyps.  Repeat colonoscopy for surveillance purposes has been deferred due to age. -As she recently underwent colonoscopy, I doubt her anemia is coming from a occult GI source.  Her FOBT was also negative in the ER.  We can consider EGD in the future if she develops signs of GI bleeding.  I suspect that her anemia is more likely attributed to her chronic kidney disease.  She states she is seeing a nephrologist tomorrow. -Patient's reflux well controlled on omeprazole.  We will continue.  States she does not need refills at this time.  We will print off  lifestyle practices for reflux -Patient follow-up in 6 months or sooner if needed   09/15/2020 3:14 PM   Disclaimer: This note was dictated with voice recognition software. Similar sounding words can inadvertently be transcribed and may not be corrected upon review.

## 2020-09-16 ENCOUNTER — Other Ambulatory Visit: Payer: Self-pay | Admitting: Nephrology

## 2020-09-16 ENCOUNTER — Other Ambulatory Visit (HOSPITAL_COMMUNITY): Payer: Self-pay | Admitting: Nephrology

## 2020-09-16 DIAGNOSIS — Z79899 Other long term (current) drug therapy: Secondary | ICD-10-CM

## 2020-09-16 DIAGNOSIS — N170 Acute kidney failure with tubular necrosis: Secondary | ICD-10-CM

## 2020-09-16 DIAGNOSIS — N184 Chronic kidney disease, stage 4 (severe): Secondary | ICD-10-CM

## 2020-09-16 DIAGNOSIS — E871 Hypo-osmolality and hyponatremia: Secondary | ICD-10-CM

## 2020-09-16 DIAGNOSIS — D638 Anemia in other chronic diseases classified elsewhere: Secondary | ICD-10-CM

## 2020-09-16 DIAGNOSIS — I129 Hypertensive chronic kidney disease with stage 1 through stage 4 chronic kidney disease, or unspecified chronic kidney disease: Secondary | ICD-10-CM

## 2020-09-23 ENCOUNTER — Other Ambulatory Visit: Payer: Self-pay

## 2020-09-23 ENCOUNTER — Ambulatory Visit (HOSPITAL_COMMUNITY)
Admission: RE | Admit: 2020-09-23 | Discharge: 2020-09-23 | Disposition: A | Discharge status: Home / Self Care | Payer: Medicare HMO | Source: Ambulatory Visit | Attending: Nephrology | Admitting: Nephrology

## 2020-09-23 DIAGNOSIS — I129 Hypertensive chronic kidney disease with stage 1 through stage 4 chronic kidney disease, or unspecified chronic kidney disease: Secondary | ICD-10-CM | POA: Diagnosis present

## 2020-09-23 DIAGNOSIS — N170 Acute kidney failure with tubular necrosis: Secondary | ICD-10-CM | POA: Insufficient documentation

## 2020-09-23 DIAGNOSIS — Z79899 Other long term (current) drug therapy: Secondary | ICD-10-CM | POA: Insufficient documentation

## 2020-09-23 DIAGNOSIS — N184 Chronic kidney disease, stage 4 (severe): Secondary | ICD-10-CM | POA: Diagnosis present

## 2020-09-23 DIAGNOSIS — D638 Anemia in other chronic diseases classified elsewhere: Secondary | ICD-10-CM | POA: Insufficient documentation

## 2020-09-23 DIAGNOSIS — E871 Hypo-osmolality and hyponatremia: Secondary | ICD-10-CM | POA: Diagnosis present

## 2020-09-28 DIAGNOSIS — E559 Vitamin D deficiency, unspecified: Secondary | ICD-10-CM | POA: Insufficient documentation

## 2020-09-28 DIAGNOSIS — K056 Periodontal disease, unspecified: Secondary | ICD-10-CM | POA: Insufficient documentation

## 2020-09-28 DIAGNOSIS — R630 Anorexia: Secondary | ICD-10-CM | POA: Insufficient documentation

## 2020-09-28 DIAGNOSIS — J449 Chronic obstructive pulmonary disease, unspecified: Secondary | ICD-10-CM | POA: Insufficient documentation

## 2020-09-28 DIAGNOSIS — L680 Hirsutism: Secondary | ICD-10-CM | POA: Insufficient documentation

## 2020-09-28 DIAGNOSIS — R413 Other amnesia: Secondary | ICD-10-CM | POA: Insufficient documentation

## 2020-10-03 ENCOUNTER — Ambulatory Visit: Payer: Medicare HMO | Admitting: Family Medicine

## 2020-10-04 ENCOUNTER — Telehealth: Payer: Self-pay | Admitting: Student

## 2020-10-04 NOTE — Telephone Encounter (Signed)
Pt states that she has been have problems with her AFib. Pt states that she feels dizzy at times. Denies having nausea. Pt c/o of heart palpitations, and some SOB. States that this episode started after she had a CT head without contrast. Reports BP as 105/52 HR 64. Please advise.

## 2020-10-04 NOTE — Telephone Encounter (Signed)
     If she has been having more frequent episodes of atrial fibrillation, I will route this to Dr. Lovena Le to see if he wants to adjust the dosage of her Flecainide as she is currently on 75mg  BID and has experienced multiple break-through episodes. She also has an Rx for PRN Cardizem but has not been on AV nodal blocking agents regularly given baseline bradycardia.   Signed, Erma Heritage, PA-C 10/04/2020, 5:02 PM Pager: [phone]

## 2020-10-05 NOTE — Telephone Encounter (Signed)
Pt notified that telephone msg has been sent to Dr. Lovena Le.

## 2020-10-10 MED ORDER — FLECAINIDE ACETATE 100 MG PO TABS
100.0000 mg | ORAL_TABLET | Freq: Two times a day (BID) | ORAL | 3 refills | Status: DC
Start: 2020-10-10 — End: 2020-10-25

## 2020-10-10 NOTE — Addendum Note (Signed)
Addended by: Levonne Hubert on: 10/10/2020 03:59 PM   Modules accepted: Orders

## 2020-10-10 NOTE — Telephone Encounter (Signed)
Pt notified, orders placed and appt. Made.

## 2020-10-10 NOTE — Telephone Encounter (Signed)
Ok to increase flecainide to 100 mg twice daily. She will need an ECG 2 weeks after the increase in her dose. GT

## 2020-10-15 ENCOUNTER — Telehealth: Payer: Self-pay | Admitting: Physician Assistant

## 2020-10-15 NOTE — Telephone Encounter (Signed)
74 year old female with paroxysmal atrial fibrillation, on anticoagulation with Apixaban.  She called the answering service today with concerns over anemia.  I reviewed her chart.  She has a recent history of anemia with hemoglobin in the 9 range.  She was seen by gastroenterology.  Notes indicate that she had a negative fecal occult blood test and has had a colonoscopy recently.  It is felt that her anemia is likely related to chronic kidney disease.  She does see nephrology.  Her creatinines have been in the 2 range.  She called the answering service today because she did see some blood on the tissue paper when she went to the bathroom.  She denies hematochezia, melena or hematemesis.  She felt somewhat fatigued today.  She checked her blood pressure while I was on the phone.  Her blood pressure is 150/56.  PLAN: Since she did see some blood today, I think she probably has hemorrhoidal bleeding.  I advised her to hold her Eliquis this morning and tonight.  She should resume Eliquis tomorrow unless told otherwise by cardiology.  I have asked her to contact her gastroenterologist if she sees any more bleeding.

## 2020-10-20 ENCOUNTER — Other Ambulatory Visit: Payer: Self-pay

## 2020-10-20 ENCOUNTER — Telehealth: Payer: Self-pay | Admitting: Student

## 2020-10-20 ENCOUNTER — Ambulatory Visit: Payer: Medicare HMO | Admitting: Student

## 2020-10-20 ENCOUNTER — Telehealth: Payer: Self-pay | Admitting: Cardiology

## 2020-10-20 ENCOUNTER — Encounter (HOSPITAL_COMMUNITY): Payer: Self-pay | Admitting: Emergency Medicine

## 2020-10-20 ENCOUNTER — Emergency Department (HOSPITAL_COMMUNITY): Payer: Medicare HMO

## 2020-10-20 ENCOUNTER — Inpatient Hospital Stay (HOSPITAL_COMMUNITY)
Admission: EM | Admit: 2020-10-20 | Discharge: 2020-10-25 | DRG: 309 | Disposition: A | Discharge status: Home / Self Care | Payer: Medicare HMO | Attending: Family Medicine | Admitting: Family Medicine

## 2020-10-20 DIAGNOSIS — E785 Hyperlipidemia, unspecified: Secondary | ICD-10-CM | POA: Diagnosis present

## 2020-10-20 DIAGNOSIS — I129 Hypertensive chronic kidney disease with stage 1 through stage 4 chronic kidney disease, or unspecified chronic kidney disease: Secondary | ICD-10-CM | POA: Diagnosis present

## 2020-10-20 DIAGNOSIS — K219 Gastro-esophageal reflux disease without esophagitis: Secondary | ICD-10-CM | POA: Diagnosis present

## 2020-10-20 DIAGNOSIS — R001 Bradycardia, unspecified: Secondary | ICD-10-CM | POA: Diagnosis not present

## 2020-10-20 DIAGNOSIS — R0600 Dyspnea, unspecified: Secondary | ICD-10-CM

## 2020-10-20 DIAGNOSIS — Z885 Allergy status to narcotic agent status: Secondary | ICD-10-CM

## 2020-10-20 DIAGNOSIS — I10 Essential (primary) hypertension: Secondary | ICD-10-CM | POA: Diagnosis present

## 2020-10-20 DIAGNOSIS — I442 Atrioventricular block, complete: Secondary | ICD-10-CM | POA: Diagnosis present

## 2020-10-20 DIAGNOSIS — Z8249 Family history of ischemic heart disease and other diseases of the circulatory system: Secondary | ICD-10-CM

## 2020-10-20 DIAGNOSIS — Z7901 Long term (current) use of anticoagulants: Secondary | ICD-10-CM

## 2020-10-20 DIAGNOSIS — Z888 Allergy status to other drugs, medicaments and biological substances status: Secondary | ICD-10-CM

## 2020-10-20 DIAGNOSIS — Z8711 Personal history of peptic ulcer disease: Secondary | ICD-10-CM

## 2020-10-20 DIAGNOSIS — N1832 Chronic kidney disease, stage 3b: Secondary | ICD-10-CM | POA: Diagnosis present

## 2020-10-20 DIAGNOSIS — I495 Sick sinus syndrome: Principal | ICD-10-CM | POA: Diagnosis present

## 2020-10-20 DIAGNOSIS — G4733 Obstructive sleep apnea (adult) (pediatric): Secondary | ICD-10-CM | POA: Diagnosis present

## 2020-10-20 DIAGNOSIS — I471 Supraventricular tachycardia: Secondary | ICD-10-CM | POA: Diagnosis present

## 2020-10-20 DIAGNOSIS — D631 Anemia in chronic kidney disease: Secondary | ICD-10-CM | POA: Diagnosis present

## 2020-10-20 DIAGNOSIS — M109 Gout, unspecified: Secondary | ICD-10-CM | POA: Diagnosis present

## 2020-10-20 DIAGNOSIS — D509 Iron deficiency anemia, unspecified: Secondary | ICD-10-CM | POA: Diagnosis present

## 2020-10-20 DIAGNOSIS — N179 Acute kidney failure, unspecified: Secondary | ICD-10-CM | POA: Diagnosis present

## 2020-10-20 DIAGNOSIS — Z880 Allergy status to penicillin: Secondary | ICD-10-CM

## 2020-10-20 DIAGNOSIS — I480 Paroxysmal atrial fibrillation: Secondary | ICD-10-CM | POA: Diagnosis present

## 2020-10-20 DIAGNOSIS — Z79899 Other long term (current) drug therapy: Secondary | ICD-10-CM

## 2020-10-20 DIAGNOSIS — Z87891 Personal history of nicotine dependence: Secondary | ICD-10-CM

## 2020-10-20 DIAGNOSIS — Z823 Family history of stroke: Secondary | ICD-10-CM

## 2020-10-20 DIAGNOSIS — Z20822 Contact with and (suspected) exposure to covid-19: Secondary | ICD-10-CM | POA: Diagnosis present

## 2020-10-20 LAB — CBC
HCT: 29.9 % — ABNORMAL LOW (ref 36.0–46.0)
Hemoglobin: 9.1 g/dL — ABNORMAL LOW (ref 12.0–15.0)
MCH: 26.4 pg (ref 26.0–34.0)
MCHC: 30.4 g/dL (ref 30.0–36.0)
MCV: 86.7 fL (ref 80.0–100.0)
Platelets: 424 10*3/uL — ABNORMAL HIGH (ref 150–400)
RBC: 3.45 MIL/uL — ABNORMAL LOW (ref 3.87–5.11)
RDW: 17.8 % — ABNORMAL HIGH (ref 11.5–15.5)
WBC: 7.6 10*3/uL (ref 4.0–10.5)
nRBC: 0 % (ref 0.0–0.2)

## 2020-10-20 LAB — BASIC METABOLIC PANEL
Anion gap: 12 (ref 5–15)
BUN: 59 mg/dL — ABNORMAL HIGH (ref 8–23)
CO2: 26 mmol/L (ref 22–32)
Calcium: 9.6 mg/dL (ref 8.9–10.3)
Chloride: 93 mmol/L — ABNORMAL LOW (ref 98–111)
Creatinine, Ser: 4.3 mg/dL — ABNORMAL HIGH (ref 0.44–1.00)
GFR, Estimated: 10 mL/min — ABNORMAL LOW (ref >60)
Glucose, Bld: 104 mg/dL — ABNORMAL HIGH (ref 70–99)
Potassium: 3.6 mmol/L (ref 3.5–5.1)
Sodium: 131 mmol/L — ABNORMAL LOW (ref 135–145)

## 2020-10-20 LAB — TROPONIN I (HIGH SENSITIVITY)
Troponin I (High Sensitivity): 29 ng/L — ABNORMAL HIGH (ref <18)
Troponin I (High Sensitivity): 27 ng/L — ABNORMAL HIGH (ref <18)

## 2020-10-20 LAB — RESPIRATORY PANEL BY RT PCR (FLU A&B, COVID)
Influenza A by PCR: NEGATIVE
Influenza B by PCR: NEGATIVE
SARS Coronavirus 2 by RT PCR: NEGATIVE

## 2020-10-20 LAB — BRAIN NATRIURETIC PEPTIDE: B Natriuretic Peptide: 893 pg/mL — ABNORMAL HIGH (ref 0.0–100.0)

## 2020-10-20 LAB — PROTIME-INR
INR: 1.8 — ABNORMAL HIGH (ref 0.8–1.2)
Prothrombin Time: 20.1 seconds — ABNORMAL HIGH (ref 11.4–15.2)

## 2020-10-20 MED ORDER — ACETAMINOPHEN 650 MG RE SUPP: 650.0000 mg | Freq: Four times a day (QID) | RECTAL | Status: DC | PRN

## 2020-10-20 MED ORDER — ATROPINE SULFATE 1 MG/10ML IJ SOSY
0.5000 mg | PREFILLED_SYRINGE | Freq: Once | INTRAMUSCULAR | Status: AC
  Administered 2020-10-20: 0.5 mg via INTRAVENOUS

## 2020-10-20 MED ORDER — ATROPINE SULFATE 1 MG/10ML IJ SOSY
PREFILLED_SYRINGE | INTRAMUSCULAR | Status: AC
  Administered 2020-10-20: 0.5 mg via INTRAVENOUS
  Filled 2020-10-20: qty 10

## 2020-10-20 MED ORDER — ATROPINE SULFATE 1 MG/10ML IJ SOSY: 0.5000 mg | PREFILLED_SYRINGE | Freq: Once | INTRAMUSCULAR | Status: AC

## 2020-10-20 MED ORDER — PROCHLORPERAZINE EDISYLATE 10 MG/2ML IJ SOLN
5.0000 mg | Freq: Four times a day (QID) | INTRAMUSCULAR | Status: DC | PRN
  Administered 2020-10-24: 5 mg via INTRAVENOUS
  Filled 2020-10-20 (×2): qty 2

## 2020-10-20 MED ORDER — ACETAMINOPHEN 325 MG PO TABS
650.0000 mg | ORAL_TABLET | Freq: Four times a day (QID) | ORAL | Status: DC | PRN
  Administered 2020-10-21 – 2020-10-24 (×6): 650 mg via ORAL
  Filled 2020-10-20 (×6): qty 2

## 2020-10-20 NOTE — ED Provider Notes (Signed)
[facility] EMERGENCY DEPARTMENT Provider Note   CSN: [id number] Arrival date & time: 10/20/20  1509     History Chief Complaint  Patient presents with  . Weakness    [name] [name] is a 74 y.o. female.  Patient with paroxysmal atrial fibrillation, on chronic Eliquis, presents to the emergency department for evaluation of generalized weakness.  Patient reports that she has been noticing increased weakness for approximately 1-1/2 weeks.  She reports that she had her flecainide increased 2 weeks ago because of increased episodes of atrial fibrillation.  She has not been experiencing any associated chest pain.  She does feel some mild shortness of breath at times.        Past Medical History:  Diagnosis Date  . GERD (gastroesophageal reflux disease)   . Gout   . Hyperlipidemia   . Hypertension   . Hypokalemia   . PAF (paroxysmal atrial fibrillation) ([place])   . PUD (peptic ulcer disease)   . Syncope    Neurocardiogenic    Patient Active Problem List   Diagnosis Date Noted  . Symptomatic bradycardia 10/20/2020  . OSA (obstructive sleep apnea) 08/09/2020  . Paroxysmal atrial fibrillation ([place]) 06/15/2020  . Bradycardia with 31-40 beats per minute 06/15/2020  . Snoring 06/15/2020  . Intermittent palpitations 06/15/2020  . Near syncope 06/15/2020  . Hypertension, uncontrolled 06/15/2020  . Atrial fibrillation with RVR ([place]) 05/04/2020  . Hyperlipidemia   . Gout   . Encounter for screening colonoscopy   . Hemorrhoid 12/16/2018  . GERD (gastroesophageal reflux disease) 01/29/2018  . Constipation 07/17/2017  . Rectal bleeding 01/30/2017  . Chest pain 12/09/2013  . Renal insufficiency 05/01/2012  . Essential hypertension, benign 05/03/2010  . VASOVAGAL SYNCOPE 05/03/2010    Past Surgical History:  Procedure Laterality Date  . ABDOMINAL HYSTERECTOMY    . CATARACT EXTRACTION Bilateral   . COLONOSCOPY N/A 10/07/2014   Dr. Oneida Alar: redundant left colon, moderate sized  external hemorrhoids   . COLONOSCOPY N/A 03/11/2020   Procedure: COLONOSCOPY;  Surgeon: Danie Binder, MD;  Location: [facility] ENDO SUITE;  Service: Endoscopy;  Laterality: N/A;  9:30am w/ overtube  . LIPOMA RESECTION    . POLYPECTOMY  03/11/2020   Procedure: POLYPECTOMY;  Surgeon: Danie Binder, MD;  Location: [facility] ENDO SUITE;  Service: Endoscopy;;  cecal, transverse, descending, sigmoid     OB History   No obstetric history on file.     Family History  Problem Relation Age of Onset  . Stroke Mother   . Dementia Father   . Cancer - Other Sister   . Atrial fibrillation Sister   . Heart failure Sister     Social History   Tobacco Use  . Smoking status: Former Smoker    Packs/day: 1.00    Years: 20.00    Pack years: 20.00    Types: Cigarettes    Start date: 03/16/1964    Quit date: 10/07/1997    Years since quitting: 23.0  . Smokeless tobacco: Never Used  Vaping Use  . Vaping Use: Never used  Substance Use Topics  . Alcohol use: No    Alcohol/week: 0.0 standard drinks  . Drug use: No    Home Medications Prior to Admission medications   Medication Sig Start Date End Date Taking? Authorizing Provider  acetaminophen (TYLENOL) 500 MG tablet Take 1,000 mg by mouth every 6 (six) hours as needed for moderate pain.    [provider]  apixaban (ELIQUIS) 5 MG TABS tablet Take 1  tablet (5 mg total) by mouth 2 (two) times daily. 07/25/20 07/20/21  Satira Sark, MD  APPLE CIDER VINEGAR PO Take 2 capsules by mouth daily.    [provider]  cetirizine (ZYRTEC) 10 MG tablet Take 10 mg by mouth daily as needed for allergies.    [provider]  colchicine 0.6 MG tablet Take 0.6 mg by mouth daily. 01/31/20   [provider]  cyproheptadine (PERIACTIN) 4 MG tablet Take 4 mg by mouth 3 (three) times daily as needed for allergies.    [provider]  diclofenac Sodium (VOLTAREN) 1 % GEL Apply 1 application topically 4 (four) times daily as needed  (pain).    [provider]  diltiazem (CARDIZEM) 30 MG tablet Take 1 tablet (30 mg total) by mouth daily as needed (palpitations). 05/05/20   Johnson, Clanford L, MD  ELDERBERRY PO Take 2 capsules by mouth daily.     [provider]  flecainide (TAMBOCOR) 100 MG tablet Take 1 tablet (100 mg total) by mouth 2 (two) times daily. 10/10/20   Evans Lance, MD  fluticasone (FLONASE) 50 MCG/ACT nasal spray Place 1 spray into both nostrils daily as needed for allergies or rhinitis.    [provider]  Fluticasone-Salmeterol (ADVAIR) 500-50 MCG/DOSE AEPB Inhale 1 puff into the lungs every 12 (twelve) hours.    [provider]  furosemide (LASIX) 40 MG tablet Take 1 tablet (40 mg total) by mouth daily. 05/27/20 09/15/20  Evans Lance, MD  hydrocortisone (PROCTOSOL HC) 2.5 % rectal cream PLACE RECTALLY 2 (TWO) TIMES DAILY. NO MORE THAN 14 DAYS AT A TIME. Patient taking differently: Place 1 application rectally 2 (two) times daily as needed for hemorrhoids.  12/16/18   Mahala Menghini, PA-C  losartan (COZAAR) 25 MG tablet Take 12.5 mg by mouth daily.    [provider]  lubiprostone (AMITIZA) 24 MCG capsule Take 1 capsule (24 mcg total) by mouth in the morning and at bedtime. 09/15/20 09/15/21  Eloise Harman, DO  meclizine (ANTIVERT) 25 MG tablet Take 25 mg by mouth 3 (three) times daily as needed for dizziness.     [provider]  omeprazole (PRILOSEC) 40 MG capsule Take 40 mg by mouth daily.      [provider]  potassium chloride SA (KLOR-CON) 20 MEQ tablet Take 20 mEq by mouth in the morning, at noon, and at bedtime.    [provider]  simvastatin (ZOCOR) 40 MG tablet Take 40 mg by mouth daily in the afternoon.     [provider]  triamterene-hydrochlorothiazide (MAXZIDE) 75-50 MG per tablet Take 1 tablet by mouth daily.    [provider]  vitamin C (ASCORBIC ACID) 250 MG tablet Take 500 mg by mouth daily.     [provider]    Allergies    Acetaminophen-codeine, Allopurinol, Amlodipine, Amoxicillin, and Tramadol  Review of Systems   Review of Systems  Constitutional: Positive for fatigue.  Respiratory: Positive for shortness of breath.   All other systems reviewed and are negative.   Physical Exam Updated Vital Signs BP (!) 154/54   Pulse (!) 33   Temp 97.7 F (36.5 C)   Resp 12   Ht 5\' 2"  (1.575 m)   Wt 74.8 kg   SpO2 96%   BMI 30.18 kg/m   Physical Exam Vitals and nursing note reviewed.  Constitutional:      General: She is not in acute distress.  Appearance: Normal appearance. She is well-developed.  HENT:     Head: Normocephalic and atraumatic.     Right Ear: Hearing normal.     Left Ear: Hearing normal.     Nose: Nose normal.  Eyes:     Conjunctiva/sclera: Conjunctivae normal.     Pupils: Pupils are equal, round, and reactive to light.  Cardiovascular:     Rate and Rhythm: Bradycardia present. Rhythm irregular.     Heart sounds: S1 normal and S2 normal. No murmur heard.  No friction rub. No gallop.   Pulmonary:     Effort: Pulmonary effort is normal. No respiratory distress.     Breath sounds: Normal breath sounds.  Chest:     Chest wall: No tenderness.  Abdominal:     General: Bowel sounds are normal.     Palpations: Abdomen is soft.     Tenderness: There is no abdominal tenderness. There is no guarding or rebound. Negative signs include Murphy's sign and McBurney's sign.     Hernia: No hernia is present.  Musculoskeletal:        General: Normal range of motion.     Cervical back: Normal range of motion and neck supple.  Skin:    General: Skin is warm and dry.     Findings: No rash.  Neurological:     Mental Status: She is alert and oriented to person, place, and time.     GCS: GCS eye subscore is 4. GCS verbal subscore is 5. GCS motor subscore is 6.     Cranial Nerves: No cranial nerve deficit.     Sensory: No sensory deficit.      Coordination: Coordination normal.  Psychiatric:        Speech: Speech normal.        Behavior: Behavior normal.        Thought Content: Thought content normal.     ED Results / Procedures / Treatments   Labs (all labs ordered are listed, but only abnormal results are displayed) Labs Reviewed  CBC - Abnormal; Notable for the following components:      Result Value   RBC 3.45 (*)    Hemoglobin 9.1 (*)    HCT 29.9 (*)    RDW 17.8 (*)    Platelets 424 (*)    All other components within normal limits  BASIC METABOLIC PANEL - Abnormal; Notable for the following components:   Sodium 131 (*)    Chloride 93 (*)    Glucose, Bld 104 (*)    BUN 59 (*)    Creatinine, Ser 4.30 (*)    GFR, Estimated 10 (*)    All other components within normal limits  BRAIN NATRIURETIC PEPTIDE - Abnormal; Notable for the following components:   B Natriuretic Peptide 893.0 (*)    All other components within normal limits  TROPONIN I (HIGH SENSITIVITY) - Abnormal; Notable for the following components:   Troponin I (High Sensitivity) 29 (*)    All other components within normal limits  RESPIRATORY PANEL BY RT PCR (FLU A&B, COVID)  TROPONIN I (HIGH SENSITIVITY)    EKG EKG Interpretation  Date/Time:  Thursday October 20 2020 15:23:02 EDT Ventricular Rate:  41 PR Interval:    QRS Duration: 136 QT Interval:  538 QTC Calculation: 443 R Axis:   21 Text Interpretation: Wide QRS rhythm Non-specific intra-ventricular conduction block Minimal voltage criteria for LVH, may be normal variant ( Cornell product ) T wave abnormality, consider lateral ischemia Abnormal ECG  No significant change since last tracing Confirmed by Orpah Greek [phone]) on 10/20/2020 4:21:02 PM   Radiology DG Chest Port 1 View  Result Date: 10/20/2020 CLINICAL DATA:  Shortness of breath EXAM: PORTABLE CHEST 1 VIEW COMPARISON:  09/11/2020 FINDINGS: The heart size and mediastinal contours are within normal limits. Both lungs  are clear. The visualized skeletal structures are unremarkable. IMPRESSION: No active disease. Electronically Signed   By: Ulyses Jarred M.D.   On: 10/20/2020 20:35    Procedures Procedures (including critical care time)  Medications Ordered in ED Medications - No data to display  ED Course  I have reviewed the triage vital signs and the nursing notes.  Pertinent labs & imaging results that were available during my care of the patient were reviewed by me and considered in my medical decision making (see chart for details).    MDM Rules/Calculators/A&P                          Patient presents to the emergency department for evaluation of generalized weakness.  Patient does have a history of paroxysmal atrial fibrillation.  She is on flecainide.  She had her flecainide dose increased from 75 mg to 100 mg recently because she was experiencing increased atrial fibrillation symptoms.  Dose change was 2 weeks ago and she has been experiencing her weakness for 1-1/2 weeks.  Patient noted to be profoundly bradycardic here in the emergency department.  She does have a baseline low heart rate but she is in the 30s today.  Patient found to have an acute kidney injury.  This is likely augmenting the effects of the increase flecainide dose causing bradycardia.  This is likely the cause of her generalized weakness.  She is not experiencing any hypotension.    Patient does have an elevated BNP of 893.  Patient does report some shortness of breath but does not have any hypoxia.  Chest x-ray does not show any pulmonary edema.  Troponin is 29.  No obvious ischemia or infarct on EKG. Records indicate similar chronic troponin elevate.  Will require hospitalization for further evaluation of her acute kidney injury and further treatment and possible work-up of bradycardia if decreasing flecainide dose does not improve.  Final Clinical Impression(s) / ED Diagnoses Final diagnoses:  AKI (acute kidney injury)  ([place])  Bradycardia    Rx / DC Orders ED Discharge Orders    None       Orpah Greek, MD 10/20/20 2105

## 2020-10-20 NOTE — Telephone Encounter (Signed)
New message    Patient is coming in for appointment this afternoon , she is having bleeding in her mouth and wants to know if she should go to the ER or just wait for her appointment at 330

## 2020-10-20 NOTE — Telephone Encounter (Signed)
   [facility] Pre-operative Risk Assessment    HEARTCARE STAFF: - Please ensure there is not already an duplicate clearance open for this procedure. - Under Visit Info/Reason for Call, type in Other and utilize the format Clearance MM/DD/YY or Clearance TBD. Do not use dashes or single digits. - If request is for dental extraction, please clarify the # of teeth to be extracted.  Request for surgical clearance:  1. What type of surgery is being performed? 1.  Kidney Biopsy  2. When is this surgery scheduled?  1. TBD   3. What type of clearance is required (medical clearance vs. Pharmacy clearance to hold med vs. Both)? Both  4. Are there any medications that need to be held prior to surgery and how long? Anticoagulation meds  5. Practice name and name of physician performing surgery? [facility], [place]  6. What is the office phone number? [phone]   7.   What is the office fax number? [phone]  8.   Anesthesia type (None, local, MAC, general) ? unknown   Alvin Critchley 10/20/2020, 10:18 AM  _________________________________________________________________   (provider comments below)

## 2020-10-20 NOTE — ED Triage Notes (Signed)
Pt c/o of weakness x 3 weeks and stated today " I saw blood on my tongue this morning'

## 2020-10-20 NOTE — Telephone Encounter (Signed)
Patient instructed to call Primary Doctor or Dentist to assess mouth bleed.

## 2020-10-20 NOTE — Telephone Encounter (Signed)
Pt canceled apt today with B.Strader, PA-C  Patient currently in [facility] ED

## 2020-10-20 NOTE — ED Notes (Signed)
MD made aware of HR bradying down into the 20's, STAT atropine ordered and given.

## 2020-10-20 NOTE — Telephone Encounter (Signed)
Patient with diagnosis of afib on Eliquis 5mg  BID (age < 37, weight > 60kg) for anticoagulation.    Procedure: kidney biopsy Date of procedure: TBD  CHA2DS2-VASc Score = 3  This indicates a 3.2% annual risk of stroke. The patient's score is based upon: CHF History: 0 HTN History: 1 Diabetes History: 0 Stroke History: 0 Vascular Disease History: 0 Age Score: 1 Gender Score: 1   CrCl 62mL/min Platelet count 393K  Per office protocol, patient can hold Eliquis for 2-3 days prior to procedure.

## 2020-10-20 NOTE — H&P (Signed)
History and Physical    [name] [name] WHQ:[id number] DOB: 07-17-1946 DOA: 10/20/2020  PCP: Andres Shad, MD  Patient coming from: Home.  I have personally briefly reviewed patient's old medical records in [place]  Chief Complaint: Weakness for 3 weeks.  HPI: [name] [name] is a 74 y.o. female with medical history significant of GERD, gout, hyperlipidemia, hypertension, history of hypokalemia, PUD, GERD, constipation, hemorrhoid, OSA awaiting for CPAP which will be done early next month who is coming to the emergency department due to progressively worse weakness, associated with postural dizziness for the past 10 days after her flecainide was increased to 100 mg p.o. twice daily a few days before her symptoms started.  She denies chest pain, palpitations, diaphoresis, PND, orthopnea or recent pitting edema of the lower extremities.  No fever, rhinorrhea, sore throat, wheezing or hemoptysis.  She denies abdominal pain, nausea, emesis, diarrhea, melena or hematochezia.  She states she gets frequently constipated.  She denies dysuria, frequency or hematuria.  ED Course: Initial vital signs were temperature 97.7 F, pulse 35, respirations 17, BP 165/51 mmHg and O2 sat 100% on room air.  While in the ER, I ordered atropine 0.5 mg IVP twice.  Labs: CBC had a white count 7.6, 9.1 g/dL and platelets 424.  BNP was 893.0 pg/mL.  Sodium is 131, potassium 3.6, chloride 93 and CO2 26 mmol/L.  BUN was 59 and creatinine 4.30 mg/dL.  About 6 weeks ago her BUN was 33 and creatinine 2.60 mg/dL. Troponin was 29 and then 27 ng/L.  BNP was 893 pg/mL.  Imaging: Her chest radiograph shows no active disease with normal heart size and mediastinal contours.  Both lungs were clear.  Review of Systems: As per HPI otherwise all other systems reviewed and are negative.  Past Medical History:  Diagnosis Date  . GERD (gastroesophageal reflux disease)   . Gout   . Hyperlipidemia   . Hypertension   .  Hypokalemia   . PAF (paroxysmal atrial fibrillation) ([place])   . PUD (peptic ulcer disease)   . Syncope    Neurocardiogenic   Past Surgical History:  Procedure Laterality Date  . ABDOMINAL HYSTERECTOMY    . CATARACT EXTRACTION Bilateral   . COLONOSCOPY N/A 10/07/2014   Dr. Oneida Alar: redundant left colon, moderate sized external hemorrhoids   . COLONOSCOPY N/A 03/11/2020   Procedure: COLONOSCOPY;  Surgeon: Danie Binder, MD;  Location: [facility];  Service: Endoscopy;  Laterality: N/A;  9:30am w/ overtube  . LIPOMA RESECTION    . POLYPECTOMY  03/11/2020   Procedure: POLYPECTOMY;  Surgeon: Danie Binder, MD;  Location: [facility];  Service: Endoscopy;;  cecal, transverse, descending, sigmoid   Social History  reports that she quit smoking about 23 years ago. Her smoking use included cigarettes. She started smoking about 56 years ago. She has a 20.00 pack-year smoking history. She has never used smokeless tobacco. She reports that she does not drink alcohol and does not use drugs.  Allergies  Allergen Reactions  . Acetaminophen-Codeine Hives  . Allopurinol Hives  . Amlodipine     Felt she retained fluid in her chest   . Amoxicillin Hives    Did it involve swelling of the face/tongue/throat, SOB, or low BP? No Did it involve sudden or severe rash/hives, skin peeling, or any reaction on the inside of your mouth or nose? No Did you need to seek medical attention at a hospital or doctor's office? No When did it  last happen?10 + years If all above answers are "NO", may proceed with cephalosporin use.   . Tramadol Hives   Family History  Problem Relation Age of Onset  . Stroke Mother   . Dementia Father   . Cancer - Other Sister   . Atrial fibrillation Sister   . Heart failure Sister    Prior to Admission medications   Medication Sig Start Date End Date Taking? Authorizing Provider  calcium-vitamin D (OSCAL WITH D) 500-200 MG-UNIT tablet Take 1 tablet by mouth.   Yes  [provider]  Cholecalciferol (VITAMIN D-3) 25 MCG (1000 UT) CAPS Take 1 capsule by mouth daily.   Yes [provider]  cloNIDine (CATAPRES) 0.1 MG tablet Take 0.1 mg by mouth 2 (two) times daily.   Yes [provider]  ferrous sulfate 325 (65 FE) MG tablet Take 325 mg by mouth daily with breakfast.   Yes [provider]  acetaminophen (TYLENOL) 500 MG tablet Take 1,000 mg by mouth every 6 (six) hours as needed for moderate pain.    [provider]  apixaban (ELIQUIS) 5 MG TABS tablet Take 1 tablet (5 mg total) by mouth 2 (two) times daily. 07/25/20 07/20/21  Satira Sark, MD  APPLE CIDER VINEGAR PO Take 2 capsules by mouth daily.    [provider]  cetirizine (ZYRTEC) 10 MG tablet Take 10 mg by mouth daily as needed for allergies.    [provider]  colchicine 0.6 MG tablet Take 0.6 mg by mouth daily. 01/31/20   [provider]  cyproheptadine (PERIACTIN) 4 MG tablet Take 4 mg by mouth 3 (three) times daily as needed for allergies.    [provider]  diclofenac Sodium (VOLTAREN) 1 % GEL Apply 1 application topically 4 (four) times daily as needed (pain).    [provider]  diltiazem (CARDIZEM) 30 MG tablet Take 1 tablet (30 mg total) by mouth daily as needed (palpitations). 05/05/20   Johnson, Clanford L, MD  ELDERBERRY PO Take 2 capsules by mouth daily.     [provider]  flecainide (TAMBOCOR) 100 MG tablet Take 1 tablet (100 mg total) by mouth 2 (two) times daily. 10/10/20   Evans Lance, MD  fluticasone (FLONASE) 50 MCG/ACT nasal spray Place 1 spray into both nostrils daily as needed for allergies or rhinitis.    [provider]  Fluticasone-Salmeterol (ADVAIR) 500-50 MCG/DOSE AEPB Inhale 1 puff into the lungs every 12 (twelve) hours.    [provider]  furosemide (LASIX) 40 MG tablet Take 1 tablet (40 mg total) by mouth daily. 05/27/20 09/15/20  Evans Lance, MD    hydrocortisone (PROCTOSOL HC) 2.5 % rectal cream PLACE RECTALLY 2 (TWO) TIMES DAILY. NO MORE THAN 14 DAYS AT A TIME. Patient taking differently: Place 1 application rectally 2 (two) times daily as needed for hemorrhoids.  12/16/18   Mahala Menghini, PA-C  losartan (COZAAR) 25 MG tablet Take 12.5 mg by mouth daily.    [provider]  lubiprostone (AMITIZA) 24 MCG capsule Take 1 capsule (24 mcg total) by mouth in the morning and at bedtime. 09/15/20 09/15/21  Eloise Harman, DO  meclizine (ANTIVERT) 25 MG tablet Take 25 mg by mouth 3 (three) times daily as needed for dizziness.     [provider]  omeprazole (PRILOSEC) 40 MG capsule Take 40 mg by mouth daily.      [provider]  potassium chloride SA (KLOR-CON) 20 MEQ tablet  Take 20 mEq by mouth in the morning, at noon, and at bedtime.    [provider]  simvastatin (ZOCOR) 40 MG tablet Take 40 mg by mouth daily in the afternoon.     [provider]  triamterene-hydrochlorothiazide (MAXZIDE) 75-50 MG per tablet Take 1 tablet by mouth daily.    [provider]  vitamin C (ASCORBIC ACID) 250 MG tablet Take 500 mg by mouth daily.    [provider]   Physical Exam: Vitals:   10/20/20 2130 10/20/20 2200 10/20/20 2215 10/20/20 2230  BP: (!) 181/62 (!) 171/57 123/72 134/69  Pulse: (!) 49 (!) 47 (!) 43 (!) 43  Resp: 15 14 20 16   Temp:      SpO2: 97% 98% 97% 98%  Weight:      Height:       Constitutional: Appears acutely ill. Eyes: PERRL, lids and conjunctivae mildly injected. ENMT: Mucous membranes are moist. Posterior pharynx clear of any exudate or lesions. Neck: normal, supple, no masses, no thyromegaly Respiratory: clear to auscultation bilaterally, no wheezing, no crackles. Normal respiratory effort. No accessory muscle use.  Cardiovascular: Bradycardic in the 30s and 40s with an irregularly irregular rhythm, no murmurs / rubs / gallops. No extremity edema. 2+ pedal pulses.  No carotid bruits.  Abdomen: Obese, nondistended.  Bowel sounds positive.  Soft, no tenderness, no masses palpated. No hepatosplenomegaly. Musculoskeletal: no clubbing / cyanosis.  Good ROM, no contractures. Normal muscle tone.  Skin: no rashes, lesions, ulcers on very limited otological examination. Neurologic: CN 2-12 grossly intact. Sensation intact, DTR normal. Strength 5/5 in all 4.  Psychiatric: Normal judgment and insight. Alert and oriented x 3. Normal mood.   Labs on Admission: I have personally reviewed following labs and imaging studies  CBC: Recent Labs  Lab 10/20/20 1853  WBC 7.6  HGB 9.1*  HCT 29.9*  MCV 86.7  PLT 756*   Basic Metabolic Panel: Recent Labs  Lab 10/20/20 1853  NA 131*  K 3.6  CL 93*  CO2 26  GLUCOSE 104*  BUN 59*  CREATININE 4.30*  CALCIUM 9.6   GFR: Estimated Creatinine Clearance: 10.9 mL/min (A) (by C-G formula based on SCr of 4.3 mg/dL (H)).  Liver Function Tests: No results for input(s): AST, ALT, ALKPHOS, BILITOT, PROT, ALBUMIN in the last 168 hours.  Urine analysis: No results found for: COLORURINE, APPEARANCEUR, LABSPEC, [place], GLUCOSEU, [place], BILIRUBINUR, KETONESUR, PROTEINUR, UROBILINOGEN, NITRITE, LEUKOCYTESUR  Radiological Exams on Admission: DG Chest Port 1 View  Result Date: 10/20/2020 CLINICAL DATA:  Shortness of breath EXAM: PORTABLE CHEST 1 VIEW COMPARISON:  09/11/2020 FINDINGS: The heart size and mediastinal contours are within normal limits. Both lungs are clear. The visualized skeletal structures are unremarkable. IMPRESSION: No active disease. Electronically Signed   By: Ulyses Jarred M.D.   On: 10/20/2020 20:35   05/05/2020 echocardiogram  IMPRESSIONS: 1. Left ventricular ejection fraction, by estimation, is 65 to 70%. The  left ventricle has normal function. The left ventricle has no regional  wall motion abnormalities. There is moderate left ventricular hypertrophy  of the posterior segment. Left    ventricular diastolic parameters are consistent with Grade II diastolic  dysfunction (pseudonormalization). Elevated left atrial pressure.  2. Right ventricular systolic function is normal. The right ventricular  size is normal. There is normal pulmonary artery systolic pressure.  3. Left atrial size was moderately dilated.  4. Nodular calcification on posterior mitral leaflet measuring 1.56 cm x  1.65 cm. This may represent a calcified  vegetation. The mitral valve is  degenerative. Mild mitral valve regurgitation. Moderate annular  calcification. Chordal calcification also  noted.  5. The aortic valve is tricuspid. Aortic valve regurgitation is not  visualized. Mild aortic valve sclerosis is present, with no evidence of  aortic valve stenosis.  6. The inferior vena cava is normal in size with greater than 50%  respiratory variability, suggesting right atrial pressure of 3 mmHg.  08/13/2019 exercise tolerance test Study Highlights   Blood pressure demonstrated a normal response to exercise.  There was no ST segment deviation noted during stress.  Did not reach target heart rate, test is nondiagnostic for ischemia  Peak heart rate 94, no significant QRS prolongation at heart rate achieved. Patient is on flecanide. Sinus rhythm throughout study  EKG: Independently reviewed. Vent. rate 41 BPM PR interval * ms QRS duration 136 ms QT/QTc 538/443 ms P-R-T axes * 21 82 Wide QRS rhythm Non-specific intra-ventricular conduction block Minimal voltage criteria for LVH, may be normal variant T wave abnormality, consider lateral ischemia Abnormal ECG  Assessment/Plan Principal Problem:   Symptomatic bradycardia Observation/stepdown. Hold flecainide, clonidine and diltiazem. Gentle IV hydration to increase. Atropine as needed. Dopamine infusion if atropine not effective. Consult cardiology in the morning.  Active Problems:   AKI (acute kidney injury) ([place]) Gentle and  time-limited IV hydration. Hold ARB and diuretics. Monitor intake and output. Monitor BUN, creatinine electrolytes.    Essential hypertension, benign Antihypertensives on hold due to bradycardia, hypotension and AKI. Monitor blood pressure, renal function electrolytes.    GERD (gastroesophageal reflux disease) Continue PPI.    Hyperlipidemia Continue simvastatin 40 mg p.o. daily.    Gout Colchicine as needed.    Paroxysmal atrial fibrillation (HCC) CHA?DS?-VASc Score of at least 5. Holding negative chronotropic medications. Continue apixaban 5 mg p.o. twice daily.    OSA (obstructive sleep apnea) Not on CPAP. Has appointment for next month. Declined to try one of our devices    Microcytic anemia Check anemia panel. Monitor H&H.    DVT prophylaxis: On apixaban. Code Status:   Full code. Family Communication:  Her niece was present in the ED room as well. Disposition Plan:   Patient is from:  Home.  Anticipated DC to:  Home.  Anticipated DC date:  10/21/2020.  Anticipated DC barriers: Clinical status.  Consults called:  Routine cardiology consult in the morning. Admission status:  Observation/stepdown.   Severity of Illness: High due to symptomatic bradycardia secondary to medications and likely decreased renal clearance of them.  Reubin Milan MD [facility]  How to contact the [facility] Attending or Consulting provider Cloud or covering provider during after hours [place], for this patient?   1. Check the care team in [facility] and look for a) attending/consulting [facility] provider listed and b) the [facility] team listed 2. Log into [email or web address] and use [place]'s universal password to access. If you do not have the password, please contact the hospital operator. 3. Locate the [facility] provider you are looking for under [facility] and page to a number that you can be directly reached. 4. If you still have difficulty reaching the provider, please page the [facility]  (Director on Call) for the Hospitalists listed on amion for assistance.  10/20/2020, 11:22 PM   This document was prepared using [employer] and then some unintended transcription errors.

## 2020-10-20 NOTE — Telephone Encounter (Signed)
Pt is scheduled for a nurse visit 11/2 for an EKG in the [place] office. Per pre op team pt needs pre op appt based on symptoms she has been having. I will send a message to [facility] scheduling to see if they can have the pt come in for a per op appt .

## 2020-10-20 NOTE — Telephone Encounter (Signed)
   Primary Cardiologist: Rozann Lesches, MD  Chart reviewed as part of pre-operative protocol coverage. Patient was contacted 10/20/2020 in reference to pre-operative [name] assessment for pending surgery as outlined below.  [name] [name] was last seen on 08/10/20 by Dr. Lovena Le.  Since that day, [name] [name] has struggled with paroxysmal atrial fibrillation and bleeding. She reports worsening SOB over the past couple months. Today had some bleeding in her mouth. She reports feeling poorly and plans to present to the ED to further evaluate her symptoms.   Due to new or worsening symptoms, [name] [name] will require a follow-up visit for further pre-operative [name] assessment.  Pre-op covering staff: - Please schedule appointment and call patient to inform them. If patient already had an upcoming appointment within acceptable timeframe, please add "pre-op clearance" to the appointment notes so provider is aware. - Please contact requesting surgeon's office via preferred method (i.e, phone, fax) to inform them of need for appointment prior to surgery.  Abigail Butts, PA-C 10/20/2020, 1:40 PM

## 2020-10-21 ENCOUNTER — Inpatient Hospital Stay (HOSPITAL_COMMUNITY): Payer: Medicare HMO

## 2020-10-21 ENCOUNTER — Encounter (HOSPITAL_COMMUNITY): Payer: Self-pay | Admitting: Family Medicine

## 2020-10-21 DIAGNOSIS — K219 Gastro-esophageal reflux disease without esophagitis: Secondary | ICD-10-CM | POA: Diagnosis present

## 2020-10-21 DIAGNOSIS — D509 Iron deficiency anemia, unspecified: Secondary | ICD-10-CM | POA: Diagnosis present

## 2020-10-21 DIAGNOSIS — Z888 Allergy status to other drugs, medicaments and biological substances status: Secondary | ICD-10-CM | POA: Diagnosis not present

## 2020-10-21 DIAGNOSIS — R001 Bradycardia, unspecified: Secondary | ICD-10-CM

## 2020-10-21 DIAGNOSIS — D631 Anemia in chronic kidney disease: Secondary | ICD-10-CM | POA: Diagnosis present

## 2020-10-21 DIAGNOSIS — Z7901 Long term (current) use of anticoagulants: Secondary | ICD-10-CM | POA: Diagnosis not present

## 2020-10-21 DIAGNOSIS — Z8249 Family history of ischemic heart disease and other diseases of the circulatory system: Secondary | ICD-10-CM | POA: Diagnosis not present

## 2020-10-21 DIAGNOSIS — Z87891 Personal history of nicotine dependence: Secondary | ICD-10-CM | POA: Diagnosis not present

## 2020-10-21 DIAGNOSIS — N179 Acute kidney failure, unspecified: Secondary | ICD-10-CM | POA: Diagnosis present

## 2020-10-21 DIAGNOSIS — Z885 Allergy status to narcotic agent status: Secondary | ICD-10-CM | POA: Diagnosis not present

## 2020-10-21 DIAGNOSIS — E782 Mixed hyperlipidemia: Secondary | ICD-10-CM

## 2020-10-21 DIAGNOSIS — N1832 Chronic kidney disease, stage 3b: Secondary | ICD-10-CM | POA: Diagnosis present

## 2020-10-21 DIAGNOSIS — I442 Atrioventricular block, complete: Secondary | ICD-10-CM | POA: Diagnosis present

## 2020-10-21 DIAGNOSIS — I480 Paroxysmal atrial fibrillation: Secondary | ICD-10-CM | POA: Diagnosis present

## 2020-10-21 DIAGNOSIS — I471 Supraventricular tachycardia: Secondary | ICD-10-CM | POA: Diagnosis present

## 2020-10-21 DIAGNOSIS — I10 Essential (primary) hypertension: Secondary | ICD-10-CM | POA: Diagnosis not present

## 2020-10-21 DIAGNOSIS — I495 Sick sinus syndrome: Secondary | ICD-10-CM | POA: Diagnosis present

## 2020-10-21 DIAGNOSIS — M109 Gout, unspecified: Secondary | ICD-10-CM | POA: Diagnosis present

## 2020-10-21 DIAGNOSIS — Z20822 Contact with and (suspected) exposure to covid-19: Secondary | ICD-10-CM | POA: Diagnosis present

## 2020-10-21 DIAGNOSIS — G4733 Obstructive sleep apnea (adult) (pediatric): Secondary | ICD-10-CM | POA: Diagnosis present

## 2020-10-21 DIAGNOSIS — E785 Hyperlipidemia, unspecified: Secondary | ICD-10-CM | POA: Diagnosis present

## 2020-10-21 DIAGNOSIS — Z823 Family history of stroke: Secondary | ICD-10-CM | POA: Diagnosis not present

## 2020-10-21 DIAGNOSIS — Z79899 Other long term (current) drug therapy: Secondary | ICD-10-CM | POA: Diagnosis not present

## 2020-10-21 DIAGNOSIS — Z8711 Personal history of peptic ulcer disease: Secondary | ICD-10-CM | POA: Diagnosis not present

## 2020-10-21 DIAGNOSIS — I129 Hypertensive chronic kidney disease with stage 1 through stage 4 chronic kidney disease, or unspecified chronic kidney disease: Secondary | ICD-10-CM | POA: Diagnosis present

## 2020-10-21 DIAGNOSIS — D649 Anemia, unspecified: Secondary | ICD-10-CM

## 2020-10-21 DIAGNOSIS — Z880 Allergy status to penicillin: Secondary | ICD-10-CM | POA: Diagnosis not present

## 2020-10-21 LAB — CBC
HCT: 31.9 % — ABNORMAL LOW (ref 36.0–46.0)
Hemoglobin: 9.6 g/dL — ABNORMAL LOW (ref 12.0–15.0)
MCH: 26.9 pg (ref 26.0–34.0)
MCHC: 30.1 g/dL (ref 30.0–36.0)
MCV: 89.4 fL (ref 80.0–100.0)
Platelets: 319 10*3/uL (ref 150–400)
RBC: 3.57 MIL/uL — ABNORMAL LOW (ref 3.87–5.11)
RDW: 18 % — ABNORMAL HIGH (ref 11.5–15.5)
WBC: 5.9 10*3/uL (ref 4.0–10.5)
nRBC: 0 % (ref 0.0–0.2)

## 2020-10-21 LAB — FOLATE: Folate: 17.1 ng/mL (ref >5.9)

## 2020-10-21 LAB — COMPREHENSIVE METABOLIC PANEL
ALT: 10 U/L (ref 0–44)
AST: 13 U/L — ABNORMAL LOW (ref 15–41)
Albumin: 4.1 g/dL (ref 3.5–5.0)
Alkaline Phosphatase: 54 U/L (ref 38–126)
Anion gap: 15 (ref 5–15)
BUN: 56 mg/dL — ABNORMAL HIGH (ref 8–23)
CO2: 26 mmol/L (ref 22–32)
Calcium: 9.6 mg/dL (ref 8.9–10.3)
Chloride: 94 mmol/L — ABNORMAL LOW (ref 98–111)
Creatinine, Ser: 3.98 mg/dL — ABNORMAL HIGH (ref 0.44–1.00)
GFR, Estimated: 11 mL/min — ABNORMAL LOW (ref >60)
Glucose, Bld: 91 mg/dL (ref 70–99)
Potassium: 3.9 mmol/L (ref 3.5–5.1)
Sodium: 135 mmol/L (ref 135–145)
Total Bilirubin: 0.9 mg/dL (ref 0.3–1.2)
Total Protein: 7.3 g/dL (ref 6.5–8.1)

## 2020-10-21 LAB — VITAMIN B12: Vitamin B-12: 1242 pg/mL — ABNORMAL HIGH (ref 180–914)

## 2020-10-21 LAB — IRON AND TIBC
Iron: 36 ug/dL (ref 28–170)
Saturation Ratios: 8 % — ABNORMAL LOW (ref 10.4–31.8)
TIBC: 480 ug/dL — ABNORMAL HIGH (ref 250–450)
UIBC: 444 ug/dL

## 2020-10-21 LAB — RETICULOCYTES
Immature Retic Fract: 27.8 % — ABNORMAL HIGH (ref 2.3–15.9)
RBC.: 3.66 MIL/uL — ABNORMAL LOW (ref 3.87–5.11)
Retic Count, Absolute: 154.8 10*3/uL (ref 19.0–186.0)
Retic Ct Pct: 4.2 % — ABNORMAL HIGH (ref 0.4–3.1)

## 2020-10-21 LAB — FERRITIN: Ferritin: 42 ng/mL (ref 11–307)

## 2020-10-21 MED ORDER — MOMETASONE FURO-FORMOTEROL FUM 200-5 MCG/ACT IN AERO
2.0000 | INHALATION_SPRAY | Freq: Two times a day (BID) | RESPIRATORY_TRACT | Status: DC
  Administered 2020-10-21 – 2020-10-25 (×8): 2 via RESPIRATORY_TRACT
  Filled 2020-10-21 (×2): qty 8.8

## 2020-10-21 MED ORDER — APIXABAN 5 MG PO TABS
5.0000 mg | ORAL_TABLET | Freq: Two times a day (BID) | ORAL | Status: DC
  Administered 2020-10-21: 5 mg via ORAL
  Filled 2020-10-21: qty 1

## 2020-10-21 MED ORDER — FERROUS SULFATE 325 (65 FE) MG PO TABS
325.0000 mg | ORAL_TABLET | Freq: Every day | ORAL | Status: DC
  Administered 2020-10-21 – 2020-10-25 (×5): 325 mg via ORAL
  Filled 2020-10-21 (×5): qty 1

## 2020-10-21 MED ORDER — CYPROHEPTADINE HCL 4 MG PO TABS
4.0000 mg | ORAL_TABLET | Freq: Three times a day (TID) | ORAL | Status: DC | PRN
  Filled 2020-10-21: qty 1

## 2020-10-21 MED ORDER — LUBIPROSTONE 24 MCG PO CAPS
24.0000 ug | ORAL_CAPSULE | Freq: Two times a day (BID) | ORAL | Status: DC
  Administered 2020-10-21 – 2020-10-25 (×8): 24 ug via ORAL
  Filled 2020-10-21 (×15): qty 1

## 2020-10-21 MED ORDER — SODIUM CHLORIDE 0.9 % IV SOLN: INTRAVENOUS | Status: DC

## 2020-10-21 MED ORDER — PANTOPRAZOLE SODIUM 40 MG PO TBEC
40.0000 mg | DELAYED_RELEASE_TABLET | Freq: Every day | ORAL | Status: DC
  Administered 2020-10-21 – 2020-10-25 (×5): 40 mg via ORAL
  Filled 2020-10-21 (×5): qty 1

## 2020-10-21 MED ORDER — HYDROCORTISONE 2.5 % RE CREA: 1.0000 "application " | TOPICAL_CREAM | Freq: Two times a day (BID) | RECTAL | Status: DC | PRN

## 2020-10-21 MED ORDER — SIMVASTATIN 20 MG PO TABS
40.0000 mg | ORAL_TABLET | Freq: Every day | ORAL | Status: DC
  Administered 2020-10-21 – 2020-10-25 (×5): 40 mg via ORAL
  Filled 2020-10-21 (×5): qty 2
  Filled 2020-10-21: qty 4

## 2020-10-21 MED ORDER — FLUTICASONE PROPIONATE 50 MCG/ACT NA SUSP: 1.0000 | Freq: Every day | NASAL | Status: DC | PRN

## 2020-10-21 NOTE — Progress Notes (Signed)
PROGRESS NOTE   [name] [name]  MEQ:[id number] DOB: [birth year] DOA: 10/20/2020 PCP: Andres Shad, MD   Chief Complaint  Patient presents with  . Weakness    Brief Admission History:  74 y.o. female with medical history significant of GERD, gout, hyperlipidemia, hypertension, history of hypokalemia, PUD, GERD, constipation, hemorrhoid, OSA awaiting for CPAP which will be done early next month who is coming to the emergency department due to progressively worse weakness, associated with postural dizziness for the past 10 days after her flecainide was increased to 100 mg p.o. twice daily a few days before her symptoms started.  She denies chest pain, palpitations, diaphoresis, PND, orthopnea or recent pitting edema of the lower extremities.  No fever, rhinorrhea, sore throat, wheezing or hemoptysis.  She denies abdominal pain, nausea, emesis, diarrhea, melena or hematochezia.  She states she gets frequently constipated.  She denies dysuria, frequency or hematuria.  ED Course: Initial vital signs were temperature 97.7 F, pulse 35, respirations 17, BP 165/51 mmHg and O2 sat 100% on room air.  While in the ER, Dr. Olevia Bowens ordered atropine 0.5 mg IVP twice.  Assessment & Plan:   Principal Problem:   Symptomatic bradycardia Active Problems:   Essential hypertension, benign   GERD (gastroesophageal reflux disease)   Hyperlipidemia   Gout   Paroxysmal atrial fibrillation (HCC)   OSA (obstructive sleep apnea)   AKI (acute kidney injury) ([place])   Microcytic anemia   1. Tachy brady syndrome - Pt presents with symptomatic bradycardia in setting of paroxysmal atrial fibrillation. Appreciate cardiology consult and recommendations for transfer to [facility] for EP consultation.  Holding flecainide for now per cardiology recs.  Pt may require pacemaker placement.  Apixaban temporarily on hold.  2. AKI on CKD stage 3b - creatinine improved with IV fluids.  Follow.  3. Paroxysmal atrial fibrillation -  Pt has poor tolerance to AV nodal agents but Dr. Harl Bowie says that if Afib with RVR becomes an issue would use low dose IV lopressor 2.5 mg if needed.  Temporarily holding apixaban in case pacemaker placement is required.  4. Anemia in CKD - follow Hg closely.  No evidence of GI bleeding.   5. OSA - pt reports arrangements being made for her to have a home CPAP.  6. Essential hypertension - temporarily hold home agents due to soft BPs, AKI and bradycardia.  7. Hyperlipidemia - resumed simvastatin 40 mg daily. 8. Gout - colchicine as needed.   DVT prophylaxis: SCDs Code Status: full  Family Communication: plan of care discussed with pt at bedside, verbalized understanding Disposition: transfer to [facility]   Status is: Inpatient  Remains inpatient appropriate because:Hemodynamically unstable and Inpatient level of care appropriate due to severity of illness   Dispo: The patient is from: Home              Anticipated d/c is to: Home              Anticipated d/c date is: 3 days              Patient currently is not medically stable to d/c.  Consultants:   cardiology  Procedures:   n/a  Antimicrobials:  n/a  Subjective: Pt says that she is starting to feel better after the atropine and after her HR started to improve.   Objective: Vitals:   10/21/20 0830 10/21/20 1002 10/21/20 1200 10/21/20 1245  BP: (!) 153/45 (!) 153/45 (!) 159/57 (!) 169/51  Pulse: (!) 46 (!) 57 (!)  53 (!) 59  Resp: 19 18    Temp:      SpO2: 100% 100% 100% 99%  Weight:      Height:        Intake/Output Summary (Last 24 hours) at 10/21/2020 1319 Last data filed at 10/21/2020 0601 Gross per 24 hour  Intake --  Output 650 ml  Net -650 ml   Filed Weights   10/20/20 1528  Weight: 74.8 kg    Examination:  General exam: elderly female, awake, alert, cooperative, appears stated age, Appears calm and comfortable  Respiratory system: Clear to auscultation. Respiratory effort normal. Cardiovascular system:  normal S1 & S2 heard, bradycardic rate. No JVD, murmurs, rubs, gallops or clicks. No pedal edema. Gastrointestinal system: Abdomen is nondistended, soft and nontender. No organomegaly or masses felt. Normal bowel sounds heard. Central nervous system: Alert and orientedx3. No focal neurological deficits. Extremities: Symmetric 5 x 5 power. Skin: No rashes, lesions or ulcers Psychiatry: Judgement and insight appear normal. Mood & affect appropriate.   Data Reviewed: I have personally reviewed following labs and imaging studies  CBC: Recent Labs  Lab 10/20/20 1853 10/21/20 0753  WBC 7.6 5.9  HGB 9.1* 9.6*  HCT 29.9* 31.9*  MCV 86.7 89.4  PLT 424* 660    Basic Metabolic Panel: Recent Labs  Lab 10/20/20 1853 10/21/20 0700  NA 131* 135  K 3.6 3.9  CL 93* 94*  CO2 26 26  GLUCOSE 104* 91  BUN 59* 56*  CREATININE 4.30* 3.98*  CALCIUM 9.6 9.6    GFR: Estimated Creatinine Clearance: 11.7 mL/min (A) (by C-G formula based on SCr of 3.98 mg/dL (H)).  Liver Function Tests: Recent Labs  Lab 10/21/20 0700  AST 13*  ALT 10  ALKPHOS 54  BILITOT 0.9  PROT 7.3  ALBUMIN 4.1    CBG: No results for input(s): GLUCAP in the last 168 hours.  Recent Results (from the past 240 hour(s))  Respiratory Panel by RT PCR (Flu A&B, Covid) - Nasopharyngeal Swab     Status: None   Collection Time: 10/20/20  9:49 PM   Specimen: Nasopharyngeal Swab  Result Value Ref Range Status   SARS Coronavirus 2 by RT PCR NEGATIVE NEGATIVE Final    Comment: (NOTE) SARS-CoV-2 target nucleic acids are NOT DETECTED.  The SARS-CoV-2 RNA is generally detectable in upper respiratoy specimens during the acute phase of infection. The lowest concentration of SARS-CoV-2 viral copies this assay can detect is 131 copies/mL. A negative result does not preclude SARS-Cov-2 infection and should not be used as the sole basis for treatment or other patient management decisions. A negative result may occur with    improper specimen collection/handling, submission of specimen other than nasopharyngeal swab, presence of viral mutation(s) within the areas targeted by this assay, and inadequate number of viral copies (<131 copies/mL). A negative result must be combined with clinical observations, patient history, and epidemiological information. The expected result is Negative.  Fact Sheet for Patients:  PinkCheek.be  Fact Sheet for Healthcare Providers:  GravelBags.it  This test is no t yet approved or cleared by the [place] FDA and  has been authorized for detection and/or diagnosis of SARS-CoV-2 by FDA under an Emergency Use Authorization (EUA). This EUA will remain  in effect (meaning this test can be used) for the duration of the COVID-19 declaration under Section 564(b)(1) of the Act, 21 U.S.C. section 360bbb-3(b)(1), unless the authorization is terminated or revoked sooner.     Influenza A by PCR  NEGATIVE NEGATIVE Final   Influenza B by PCR NEGATIVE NEGATIVE Final    Comment: (NOTE) The Xpert Xpress SARS-CoV-2/FLU/RSV assay is intended as an aid in  the diagnosis of influenza from Nasopharyngeal swab specimens and  should not be used as a sole basis for treatment. Nasal washings and  aspirates are unacceptable for Xpert Xpress SARS-CoV-2/FLU/RSV  testing.  Fact Sheet for Patients: PinkCheek.be  Fact Sheet for Healthcare Providers: GravelBags.it  This test is not yet approved or cleared by the [place] FDA and  has been authorized for detection and/or diagnosis of SARS-CoV-2 by  FDA under an Emergency Use Authorization (EUA). This EUA will remain  in effect (meaning this test can be used) for the duration of the  Covid-19 declaration under Section 564(b)(1) of the Act, 21  U.S.C. section 360bbb-3(b)(1), unless the authorization is  terminated or  revoked. Performed at [facility], [address], [place], [place] [address]      Radiology Studies: [facility] Chest [facility] 1 View  Result Date: 10/20/2020 CLINICAL DATA:  Shortness of breath EXAM: PORTABLE CHEST 1 VIEW COMPARISON:  09/11/2020 FINDINGS: The heart size and mediastinal contours are within normal limits. Both lungs are clear. The visualized skeletal structures are unremarkable. IMPRESSION: No active disease. Electronically Signed   By: Ulyses Jarred M.D.   On: 10/20/2020 20:35   Scheduled Meds: . ferrous sulfate  325 mg Oral Q breakfast  . lubiprostone  24 mcg Oral BID WC  . mometasone-formoterol  2 puff Inhalation BID  . pantoprazole  40 mg Oral Daily  . simvastatin  40 mg Oral Q1500   Continuous Infusions: . sodium chloride 60 mL/hr at 10/21/20 1309     LOS: 0 days   Time spent: 34 minutes   Caedon Bond Wynetta Emery, MD How to contact the [facility] Attending or Consulting provider [place] or covering provider during after hours [place], for this patient?  1. Check the care team in [facility] and look for a) attending/consulting [facility] provider listed and b) the [facility] team listed 2. Log into [email or web address] and use [place]'s universal password to access. If you do not have the password, please contact the hospital operator. 3. Locate the [facility] provider you are looking for under [facility] and page to a number that you can be directly reached. 4. If you still have difficulty reaching the provider, please page the [facility] (Director on Call) for the Hospitalists listed on amion for assistance.  10/21/2020, 1:19 PM

## 2020-10-21 NOTE — Progress Notes (Signed)
Pt lung sounds clear on auscultation  at admission to the unit . At shift change, pt had c/o mild SOB. She stated " I think I need lasix " . Lung sounds now has bil LLL crackles. Has active order for Normal saline at 60 cc/hr. Dr. Megan Mans updated and requesting for guidance if we will continue with IV fluids. Pt current Cr level at 3.98. Oncoming HS RN aware.

## 2020-10-21 NOTE — Consult Note (Addendum)
Cardiology Consult    Patient ID: [name] [name]; [id number]; [birth year]   Admit date: 10/20/2020 Date of Consult: 10/21/2020  Primary Care Provider: Andres Shad, MD Primary Cardiologist: Rozann Lesches, MD  Primary Electrophysiologist: Dr. Lovena Le  Patient Profile    [name] [name] is a 74 y.o. female with past medical history of paroxysmal atrial fibrillation (on Flecainide as she was previously intolerant to daily BB/CCB therapy due to bradycardia), HTN, HLD, lower extremity edema, Stage 3 CKD and vasovagal syncope who is being seen today for the evaluation of bradycardia at the request of Dr. Wynetta Emery.   History of Present Illness    Ms. Cuffie was examined by Dr. Lovena Le in 07/2020 and reported her palpitations had significantly improved since being started on Flecainide 75mg  BID. An ETT was performed and showed no evidence of QRS prolongation.   She did call the office earlier this month reporting more recent palpitations and dizziness with Flecainide being increased to 100mg  BID on 10/10/2020.  She called the office on 10/20/2020 and reported bleeding along her mouth and was informed to follow-up with her PCP or dentist (no recurrence since admission). Did request cardiac clearance for an upcoming kidney biopsy. She presented to the Emergency Department later that evening for evaluation of progressive weakness. In talking with the patient today, she reports having progressive weakness over the past 1-2 weeks. Reports fatigue and transient episodes of dizziness. Denies any syncopal episodes. Reports a poor appetite but has been consuming fluids. No recent chest pain and she denies any palpitations since Flecainide was titrated. She was evaluated by Nephrology 3 weeks ago and reports her Losartan and Triamterene-HCTZ were discontinued and she was placed on Lasix 40mg  daily.   Initial labs show WBC 7.6, Hgb 9.1 (previously 12.1 four months prior), platelets 424, Na+ 131, K+  3.6 and creatinine 4.30 (previously 1.3 - 1.4 earlier this year, at 2.60 in 08/2020). BNP 893. Initial HS Troponin 29 with repeat of 27. COVID negative. CXR with no active cardiopulmonary disease. EKG shows junctional bradycardia, HR 41 with IVCD.    Flecainide and her diuretic were held at the time of admission and she was started on IVF.   Repeat BMET this AM shows creatinine has improved to 3.98. She was in a junctional rhythm by telemetry initially and was having frequent pauses (longest being 4.5 seconds). She is now in sinus bradycardia with HR in the 40's to 50's (pause of 3.3 seconds this AM).    Past Medical History:  Diagnosis Date  . Constipation 07/17/2017  . GERD (gastroesophageal reflux disease)   . Gout   . Hyperlipidemia   . Hypertension   . Hypokalemia   . OSA (obstructive sleep apnea) 08/09/2020  . PAF (paroxysmal atrial fibrillation) ([place])   . PAF (paroxysmal atrial fibrillation) ([place])   . PUD (peptic ulcer disease)   . Syncope    Neurocardiogenic    Past Surgical History:  Procedure Laterality Date  . ABDOMINAL HYSTERECTOMY    . CATARACT EXTRACTION Bilateral   . COLONOSCOPY N/A 10/07/2014   Dr. Oneida Alar: redundant left colon, moderate sized external hemorrhoids   . COLONOSCOPY N/A 03/11/2020   Procedure: COLONOSCOPY;  Surgeon: Danie Binder, MD;  Location: [facility];  Service: Endoscopy;  Laterality: N/A;  9:30am w/ overtube  . LIPOMA RESECTION    . POLYPECTOMY  03/11/2020   Procedure: POLYPECTOMY;  Surgeon: Danie Binder, MD;  Location: [facility];  Service: Endoscopy;;  cecal, transverse, descending,  sigmoid     Home Medications:  Prior to Admission medications   Medication Sig Start Date End Date Taking? Authorizing Provider  calcium-vitamin D (OSCAL WITH D) 500-200 MG-UNIT tablet Take 1 tablet by mouth.   Yes [provider]  Cholecalciferol (VITAMIN D-3) 25 MCG (1000 UT) CAPS Take 1 capsule by mouth daily.   Yes [provider]    cloNIDine (CATAPRES) 0.1 MG tablet Take 0.1 mg by mouth 2 (two) times daily.   Yes [provider]  ferrous sulfate 325 (65 FE) MG tablet Take 325 mg by mouth daily with breakfast.   Yes [provider]  acetaminophen (TYLENOL) 500 MG tablet Take 1,000 mg by mouth every 6 (six) hours as needed for moderate pain.    [provider]  apixaban (ELIQUIS) 5 MG TABS tablet Take 1 tablet (5 mg total) by mouth 2 (two) times daily. 07/25/20 07/20/21  Satira Sark, MD  APPLE CIDER VINEGAR PO Take 2 capsules by mouth daily.    [provider]  cetirizine (ZYRTEC) 10 MG tablet Take 10 mg by mouth daily as needed for allergies.    [provider]  colchicine 0.6 MG tablet Take 0.6 mg by mouth daily. 01/31/20   [provider]  cyproheptadine (PERIACTIN) 4 MG tablet Take 4 mg by mouth 3 (three) times daily as needed for allergies.    [provider]  diclofenac Sodium (VOLTAREN) 1 % GEL Apply 1 application topically 4 (four) times daily as needed (pain).    [provider]  diltiazem (CARDIZEM) 30 MG tablet Take 1 tablet (30 mg total) by mouth daily as needed (palpitations). 05/05/20   Johnson, Clanford L, MD  ELDERBERRY PO Take 2 capsules by mouth daily.     [provider]  flecainide (TAMBOCOR) 100 MG tablet Take 1 tablet (100 mg total) by mouth 2 (two) times daily. 10/10/20   Evans Lance, MD  fluticasone (FLONASE) 50 MCG/ACT nasal spray Place 1 spray into both nostrils daily as needed for allergies or rhinitis.    [provider]  Fluticasone-Salmeterol (ADVAIR) 500-50 MCG/DOSE AEPB Inhale 1 puff into the lungs every 12 (twelve) hours.    [provider]  furosemide (LASIX) 40 MG tablet Take 1 tablet (40 mg total) by mouth daily. 05/27/20 09/15/20  Evans Lance, MD  hydrocortisone (PROCTOSOL HC) 2.5 % rectal cream PLACE RECTALLY 2 (TWO) TIMES DAILY. NO MORE THAN 14 DAYS AT A TIME. Patient taking  differently: Place 1 application rectally 2 (two) times daily as needed for hemorrhoids.  12/16/18   Mahala Menghini, PA-C  losartan (COZAAR) 25 MG tablet Take 12.5 mg by mouth daily.    [provider]  lubiprostone (AMITIZA) 24 MCG capsule Take 1 capsule (24 mcg total) by mouth in the morning and at bedtime. 09/15/20 09/15/21  Eloise Harman, DO  meclizine (ANTIVERT) 25 MG tablet Take 25 mg by mouth 3 (three) times daily as needed for dizziness.     [provider]  omeprazole (PRILOSEC) 40 MG capsule Take 40 mg by mouth daily.      [provider]  potassium chloride SA (KLOR-CON) 20 MEQ tablet Take 20 mEq by mouth in the morning, at noon, and at bedtime.    [provider]  simvastatin (ZOCOR) 40 MG tablet Take 40 mg by mouth daily in the afternoon.     [provider]  triamterene-hydrochlorothiazide (MAXZIDE) 75-50 MG per tablet Take 1 tablet by mouth  daily.    [provider]  vitamin C (ASCORBIC ACID) 250 MG tablet Take 500 mg by mouth daily.    [provider]    Inpatient Medications: Scheduled Meds: . apixaban  5 mg Oral BID  . ferrous sulfate  325 mg Oral Q breakfast  . lubiprostone  24 mcg Oral BID WC  . mometasone-formoterol  2 puff Inhalation BID  . pantoprazole  40 mg Oral Daily  . simvastatin  40 mg Oral Q1500   Continuous Infusions: . sodium chloride 75 mL/hr at 10/21/20 0113   PRN Meds: acetaminophen **OR** acetaminophen, cyproheptadine, fluticasone, hydrocortisone, prochlorperazine  Allergies:    Allergies  Allergen Reactions  . Acetaminophen-Codeine Hives  . Allopurinol Hives  . Amlodipine     Felt she retained fluid in her chest   . Amoxicillin Hives    Did it involve swelling of the face/tongue/throat, SOB, or low BP? No Did it involve sudden or severe rash/hives, skin peeling, or any reaction on the inside of your mouth or nose? No Did you need to seek medical attention at a hospital or  doctor's office? No When did it last happen?10 + years If all above answers are "NO", may proceed with cephalosporin use.   . Tramadol Hives    Social History:   Social History   Socioeconomic History  . Marital status: Single    Spouse name: Not on file  . Number of children: Not on file  . Years of education: Not on file  . Highest education level: Not on file  Occupational History  . Occupation: Retired  Tobacco Use  . Smoking status: Former Smoker    Packs/day: 1.00    Years: 20.00    Pack years: 20.00    Types: Cigarettes    Start date: 03/16/1964    Quit date: 10/07/1997    Years since quitting: 23.0  . Smokeless tobacco: Never Used  Vaping Use  . Vaping Use: Never used  Substance and Sexual Activity  . Alcohol use: No    Alcohol/week: 0.0 standard drinks  . Drug use: No  . Sexual activity: Yes    Partners: Male  Other Topics Concern  . Not on file  Social History Narrative  . Not on file   Social Determinants of Health   Financial Resource Strain:   . Difficulty of Paying Living Expenses: Not on file  Food Insecurity:   . Worried About Charity fundraiser in the Last Year: Not on file  . Ran Out of Food in the Last Year: Not on file  Transportation Needs:   . Lack of Transportation (Medical): Not on file  . Lack of Transportation (Non-Medical): Not on file  Physical Activity:   . Days of Exercise per Week: Not on file  . Minutes of Exercise per Session: Not on file  Stress:   . Feeling of Stress : Not on file  Social Connections:   . Frequency of Communication with Friends and Family: Not on file  . Frequency of Social Gatherings with Friends and Family: Not on file  . Attends Religious Services: Not on file  . Active Member of Clubs or Organizations: Not on file  . Attends Archivist Meetings: Not on file  . Marital Status: Not on file  Intimate Partner Violence:   . Fear of Current or Ex-Partner: Not on file  . Emotionally  Abused: Not on file  . Physically Abused: Not on file  . Sexually Abused: Not  on file     Family History:    Family History  Problem Relation Age of Onset  . Stroke Mother   . Dementia Father   . Cancer - Other Sister   . Atrial fibrillation Sister   . Heart failure Sister       Review of Systems    General:  No chills, fever, night sweats or weight changes. Positive for weakness and dizziness.  Cardiovascular:  No chest pain, dyspnea on exertion, edema, orthopnea, palpitations, paroxysmal nocturnal dyspnea. Dermatological: No rash, lesions/masses Respiratory: No cough, dyspnea Urologic: No hematuria, dysuria Abdominal:   No nausea, vomiting, diarrhea, bright red blood per rectum, melena, or hematemesis Neurologic:  No visual changes, wkns, changes in mental status. All other systems reviewed and are otherwise negative except as noted above.  Physical Exam/Data    Vitals:   10/21/20 0759 10/21/20 0800 10/21/20 0830 10/21/20 1002  BP:  (!) 151/57 (!) 153/45 (!) 153/45  Pulse: (!) 48 (!) 48 (!) 46 (!) 57  Resp: 19 (!) 21 19 18   Temp:      SpO2: 100% 100% 100% 100%  Weight:      Height:        Intake/Output Summary (Last 24 hours) at 10/21/2020 1225 Last data filed at 10/21/2020 0601 Gross per 24 hour  Intake --  Output 650 ml  Net -650 ml   Filed Weights   10/20/20 1528  Weight: 74.8 kg   Body mass index is 30.18 kg/m.   General: Pleasant, female appearing in NAD Psych: Normal affect. Neuro: Alert and oriented X 3. Moves all extremities spontaneously. HEENT: Normal  Neck: Supple without bruits or JVD. Lungs:  Resp regular and unlabored, CTA without wheezing or rales. Heart: Regular rhythm, bradycardiac rate. No s3, s4, or murmurs. Abdomen: Soft, non-tender, non-distended, BS + x 4.  Extremities: No clubbing, cyanosis or edema. DP/PT/Radials 2+ and equal bilaterally.   EKG:  The EKG was personally reviewed and demonstrates: Junctional bradycardia, HR 41  with IVCD.    Labs/Studies     Relevant CV Studies:  Echocardiogram: 04/2020 IMPRESSIONS    1. Left ventricular ejection fraction, by estimation, is 65 to 70%. The  left ventricle has normal function. The left ventricle has no regional  wall motion abnormalities. There is moderate left ventricular hypertrophy  of the posterior segment. Left  ventricular diastolic parameters are consistent with Grade II diastolic  dysfunction (pseudonormalization). Elevated left atrial pressure.  2. Right ventricular systolic function is normal. The right ventricular  size is normal. There is normal pulmonary artery systolic pressure.  3. Left atrial size was moderately dilated.  4. Nodular calcification on posterior mitral leaflet measuring 1.56 cm x  1.65 cm. This may represent a calcified vegetation. The mitral valve is  degenerative. Mild mitral valve regurgitation. Moderate annular  calcification. Chordal calcification also  noted.  5. The aortic valve is tricuspid. Aortic valve regurgitation is not  visualized. Mild aortic valve sclerosis is present, with no evidence of  aortic valve stenosis.  6. The inferior vena cava is normal in size with greater than 50%  respiratory variability, suggesting right atrial pressure of 3 mmHg.    ETT: 07/2020  Blood pressure demonstrated a normal response to exercise.  There was no ST segment deviation noted during stress.  Did not reach target heart rate, test is nondiagnostic for ischemia  Peak heart rate 94, no significant QRS prolongation at heart rate achieved. Patient is on flecanide. Sinus rhythm throughout study  Laboratory Data:  Chemistry Recent Labs  Lab 10/20/20 1853 10/21/20 0700  NA 131* 135  K 3.6 3.9  CL 93* 94*  CO2 26 26  GLUCOSE 104* 91  BUN 59* 56*  CREATININE 4.30* 3.98*  CALCIUM 9.6 9.6  GFRNONAA 10* 11*  ANIONGAP 12 15    Recent Labs  Lab 10/21/20 0700  PROT 7.3  ALBUMIN 4.1  AST 13*  ALT 10   ALKPHOS 54  BILITOT 0.9   Hematology Recent Labs  Lab 10/20/20 1853 10/21/20 0753  WBC 7.6 5.9  RBC 3.45* 3.57*  3.66*  HGB 9.1* 9.6*  HCT 29.9* 31.9*  MCV 86.7 89.4  MCH 26.4 26.9  MCHC 30.4 30.1  RDW 17.8* 18.0*  PLT 424* 319   Cardiac EnzymesNo results for input(s): TROPONINI in the last 168 hours. No results for input(s): TROPIPOC in the last 168 hours.  BNP Recent Labs  Lab 10/20/20 1853  BNP 893.0*    DDimer No results for input(s): DDIMER in the last 168 hours.  Radiology/Studies:  DG Chest Port 1 View  Result Date: 10/20/2020 CLINICAL DATA:  Shortness of breath EXAM: PORTABLE CHEST 1 VIEW COMPARISON:  09/11/2020 FINDINGS: The heart size and mediastinal contours are within normal limits. Both lungs are clear. The visualized skeletal structures are unremarkable. IMPRESSION: No active disease. Electronically Signed   By: Ulyses Jarred M.D.   On: 10/20/2020 20:35     Assessment & Plan    1. Junctional Bradycardia in the setting of known Paroxysmal Atrial Fibrillation - She has a history of symptomatic paroxysmal atrial fibrillation and complicated by tachy-brady syndrome which led to discontinuation of AV nodal blocking agents and initiation of Flecainide. Recently titrated to 100mg  BID on 10/10/2020 but presents with progressive weakness and dizziness, found to have junctional bradycardia.  - Antiarrhythmic therapy options are limited given the recent use of Flecainide along with her progressive renal dysfunction. Will review with Dr. Harl Bowie but she may require inpatient EP evaluation for consideration of PPM placement. Patient initially reluctant when discussed earlier today but understands medication management options are limited. Continue to hold PTA Flecainide for now.  - This patients CHA2DS2-VASc Score and unadjusted Ischemic Stroke Rate (% per year) is equal to 3.2 % stroke rate/year from a score of 3 (HTN, Female, Age). Remains on Eliquis 5mg  BID for  anticoagulation.   2. AKI - Baseline creatinine 1.3 - 1.4. Elevated to 2.60 in 08/2020 and at 4.30 on admission. PTA Lasix held and receiving IVF. Improved to 3.98 this AM. She reports her Losartan and Triamterene-HCTZ were discontinued by Nephrology 3 weeks ago. Further management per admitting team.   3. HTN - BP has been variable from 123/72 - 181/62 since admission, at 153/45 on most recent check. Would use Hydralazine if additional medical therapy is indicated this admission.   4. Anemia - Hgb at 9.6 this AM. Previously experiencing bleeding from her oral cavity but denies any recurrence. Scheduled for outpatient Feraheme on Monday so may need to be performed while admitted.    For questions or updates, please contact [place] Please consult [email or web address] for contact info under Cardiology/STEMI.  Signed, Erma Heritage, PA-C 10/21/2020, 12:25 PM Pager: [phone]  Patient seen and discussed with PA Ahmed Prima, I agree with her documentation. 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   Regarding her afib Most recently has been on flecanide by Dr Lovena Le. Initially  afib symptoms improved. 10/04/20 phone call with recurrent afib symptoms, flecanide was increased to 100mg  bid on 10/10/20. From prior notes has not tolerated av nodal agents prior due to bradycardia.   Reports dizziness, generlaized weakness, near syncope at home.   WBC 7.6 Hgb 9.1 Plt 424 BNP 893 K 3.6 Cr 4.30 (was 2.6 during 08/2020 labd) BUN 59 hstrop 29--> 27 COVID neg CXR no acute process EKG    Difficultly controlling afib as reported above. Intolerant to av nodal agents due to bradycardia. Has been on flecanide. Of note even prior to 10/18 increase of flecanide to 100mg  bid she was in a junctional rhythm at a 09/11/20 ER visit. Today junctional in the 40s initially. Later in the day SR but  sinus pauses longest 4.6 seconds. Flecanide stopped. 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 May require pacemaker placement, would recommend EP evaluation this admission. Recommend transfer to [facility] to medicine service, further workup for AKI on CKD and anemia per medicine, will have EP evaluate for tachy brady syndrome and possible pacemaker placement. Hold eliquis for now. Would use prn IV lopressor 2.5mg  if issues with afib with RVR this admission.      Carlyle Dolly MD

## 2020-10-21 NOTE — Plan of Care (Signed)

## 2020-10-21 NOTE — Telephone Encounter (Signed)
10/21/20 patient admitted to [facility]

## 2020-10-22 DIAGNOSIS — I480 Paroxysmal atrial fibrillation: Secondary | ICD-10-CM | POA: Diagnosis not present

## 2020-10-22 DIAGNOSIS — I10 Essential (primary) hypertension: Secondary | ICD-10-CM | POA: Diagnosis not present

## 2020-10-22 DIAGNOSIS — N179 Acute kidney failure, unspecified: Secondary | ICD-10-CM | POA: Diagnosis not present

## 2020-10-22 LAB — BASIC METABOLIC PANEL
Anion gap: 14 (ref 5–15)
BUN: 38 mg/dL — ABNORMAL HIGH (ref 8–23)
CO2: 24 mmol/L (ref 22–32)
Calcium: 9.5 mg/dL (ref 8.9–10.3)
Chloride: 100 mmol/L (ref 98–111)
Creatinine, Ser: 3 mg/dL — ABNORMAL HIGH (ref 0.44–1.00)
GFR, Estimated: 16 mL/min — ABNORMAL LOW (ref >60)
Glucose, Bld: 109 mg/dL — ABNORMAL HIGH (ref 70–99)
Potassium: 3.1 mmol/L — ABNORMAL LOW (ref 3.5–5.1)
Sodium: 138 mmol/L (ref 135–145)

## 2020-10-22 LAB — CBC WITH DIFFERENTIAL/PLATELET
Abs Immature Granulocytes: 0.02 10*3/uL (ref 0.00–0.07)
Basophils Absolute: 0 10*3/uL (ref 0.0–0.1)
Basophils Relative: 0 %
Eosinophils Absolute: 0.1 10*3/uL (ref 0.0–0.5)
Eosinophils Relative: 1 %
HCT: 29.8 % — ABNORMAL LOW (ref 36.0–46.0)
Hemoglobin: 9.1 g/dL — ABNORMAL LOW (ref 12.0–15.0)
Immature Granulocytes: 0 %
Lymphocytes Relative: 22 %
Lymphs Abs: 1.9 10*3/uL (ref 0.7–4.0)
MCH: 27 pg (ref 26.0–34.0)
MCHC: 30.5 g/dL (ref 30.0–36.0)
MCV: 88.4 fL (ref 80.0–100.0)
Monocytes Absolute: 0.9 10*3/uL (ref 0.1–1.0)
Monocytes Relative: 11 %
Neutro Abs: 5.6 10*3/uL (ref 1.7–7.7)
Neutrophils Relative %: 66 %
Platelets: 391 10*3/uL (ref 150–400)
RBC: 3.37 MIL/uL — ABNORMAL LOW (ref 3.87–5.11)
RDW: 18 % — ABNORMAL HIGH (ref 11.5–15.5)
WBC: 8.5 10*3/uL (ref 4.0–10.5)
nRBC: 0 % (ref 0.0–0.2)

## 2020-10-22 LAB — MAGNESIUM: Magnesium: 2.3 mg/dL (ref 1.7–2.4)

## 2020-10-22 MED ORDER — ALUM & MAG HYDROXIDE-SIMETH 200-200-20 MG/5ML PO SUSP
30.0000 mL | Freq: Four times a day (QID) | ORAL | Status: DC | PRN
  Administered 2020-10-22 – 2020-10-24 (×2): 30 mL via ORAL
  Filled 2020-10-22 (×3): qty 30

## 2020-10-22 MED ORDER — HYDRALAZINE HCL 20 MG/ML IJ SOLN
10.0000 mg | INTRAMUSCULAR | Status: DC | PRN
  Administered 2020-10-22 – 2020-10-24 (×3): 10 mg via INTRAVENOUS
  Filled 2020-10-22 (×5): qty 1

## 2020-10-22 MED ORDER — HYDROMORPHONE HCL 1 MG/ML IJ SOLN: 0.5000 mg | Freq: Once | INTRAMUSCULAR | Status: DC

## 2020-10-22 NOTE — Consult Note (Signed)
Cardiology Consultation:   Patient ID: [name] [name] MRN: [id number]; DOB: November 09, 1946  Admit date: 10/20/2020 Date of Consult: 10/22/2020  Primary Care Provider: Andres Shad, MD [facility] Cardiologist: Rozann Lesches, MD  [facility] Electrophysiologist:  Cristopher Peru   Patient Profile:   [name] [name] is a 74 y.o. female with a hx of paroxysmal atrial fibrillation, HTN, HLD, OSA, CKD who is being seen today for the evaluation of bradycardia at the request of Carlyle Dolly and Dr Doristine Bosworth. Patient recently had her flecainide uptitrated by Dr Lovena Le to help control her atrial fibrillation episodes. She tells me that she was very weak prior to admission but denies any syncope or presyncope. There were telephone notes mentioning bleeding from her mouth, etc but this doesn't seem to be an issue now. She was transferred to [facility] for further workup of her anemia and AKI on CKD.   Past Medical History:  Diagnosis Date  . Constipation 07/17/2017  . GERD (gastroesophageal reflux disease)   . Gout   . Hyperlipidemia   . Hypertension   . Hypokalemia   . OSA (obstructive sleep apnea) 08/09/2020  . PAF (paroxysmal atrial fibrillation) ([place])   . PAF (paroxysmal atrial fibrillation) ([place])   . PUD (peptic ulcer disease)   . Syncope    Neurocardiogenic    Past Surgical History:  Procedure Laterality Date  . ABDOMINAL HYSTERECTOMY    . CATARACT EXTRACTION Bilateral   . COLONOSCOPY N/A 10/07/2014   Dr. Oneida Alar: redundant left colon, moderate sized external hemorrhoids   . COLONOSCOPY N/A 03/11/2020   Procedure: COLONOSCOPY;  Surgeon: Danie Binder, MD;  Location: [facility];  Service: Endoscopy;  Laterality: N/A;  9:30am w/ overtube  . LIPOMA RESECTION    . POLYPECTOMY  03/11/2020   Procedure: POLYPECTOMY;  Surgeon: Danie Binder, MD;  Location: [facility];  Service: Endoscopy;;  cecal, transverse, descending, sigmoid     Home Medications:  Prior to Admission  medications   Medication Sig Start Date End Date Taking? Authorizing Provider  albuterol (VENTOLIN HFA) 108 (90 Base) MCG/ACT inhaler Inhale 1-2 puffs into the lungs every 6 (six) hours as needed for wheezing or shortness of breath.  09/23/20  Yes [provider]  apixaban (ELIQUIS) 5 MG TABS tablet Take 1 tablet (5 mg total) by mouth 2 (two) times daily. 07/25/20 07/20/21 Yes Satira Sark, MD  APPLE CIDER VINEGAR PO Take 2 capsules by mouth daily.   Yes [provider]  cetirizine (ZYRTEC) 10 MG tablet Take 10 mg by mouth daily as needed for allergies.   Yes [provider]  Cholecalciferol (VITAMIN D-3) 25 MCG (1000 UT) CAPS Take 1 capsule by mouth daily.   Yes [provider]  cloNIDine (CATAPRES) 0.1 MG tablet Take 0.1 mg by mouth 2 (two) times daily.   Yes [provider]  colchicine 0.6 MG tablet Take 0.6 mg by mouth daily. 01/31/20  Yes [provider]  cyclobenzaprine (FLEXERIL) 5 MG tablet Take 5 mg by mouth 3 (three) times daily as needed for muscle spasms.  06/19/20  Yes [provider]  cyproheptadine (PERIACTIN) 4 MG tablet Take 4 mg by mouth 3 (three) times daily as needed for allergies.   Yes [provider]  diclofenac Sodium (VOLTAREN) 1 % GEL Apply 1 application topically 4 (four) times daily as needed (pain).   Yes [provider]  diltiazem (CARDIZEM) 30 MG tablet Take 1 tablet (30 mg total) by mouth daily  as needed (palpitations). 05/05/20  Yes Johnson, Clanford L, MD  ELDERBERRY PO Take 2 capsules by mouth daily.    Yes [provider]  ferrous sulfate 325 (65 FE) MG tablet Take 325 mg by mouth daily with breakfast.   Yes [provider]  flecainide (TAMBOCOR) 100 MG tablet Take 1 tablet (100 mg total) by mouth 2 (two) times daily. 10/10/20  Yes Evans Lance, MD  fluticasone [facility]) 50 MCG/ACT nasal spray Place 1 spray into both nostrils daily as needed for allergies or  rhinitis.   Yes [provider]  Fluticasone-Salmeterol (ADVAIR) 500-50 MCG/DOSE AEPB Inhale 1 puff into the lungs every 12 (twelve) hours.   Yes [provider]  furosemide (LASIX) 40 MG tablet Take 1 tablet (40 mg total) by mouth daily. 05/27/20 10/21/20 Yes Evans Lance, MD  lubiprostone (AMITIZA) 24 MCG capsule Take 1 capsule (24 mcg total) by mouth in the morning and at bedtime. 09/15/20 09/15/21 Yes Carver, Elon Alas, DO  meclizine (ANTIVERT) 25 MG tablet Take 25 mg by mouth 3 (three) times daily as needed for dizziness.    Yes [provider]  omeprazole (PRILOSEC) 40 MG capsule Take 40 mg by mouth daily.     Yes [provider]  potassium chloride SA (KLOR-CON) 20 MEQ tablet Take 20 mEq by mouth in the morning, at noon, and at bedtime.   Yes [provider]  simvastatin (ZOCOR) 40 MG tablet Take 40 mg by mouth daily in the afternoon.    Yes [provider]  vitamin C (ASCORBIC ACID) 250 MG tablet Take 500 mg by mouth daily.   Yes [provider]  acetaminophen (TYLENOL) 500 MG tablet Take 1,000 mg by mouth every 6 (six) hours as needed for moderate pain.    [provider]  hydrocortisone (PROCTOSOL HC) 2.5 % rectal cream PLACE RECTALLY 2 (TWO) TIMES DAILY. NO MORE THAN 14 DAYS AT A TIME. Patient not taking: Reported on 10/21/2020 12/16/18   Mahala Menghini, PA-C  losartan (COZAAR) 25 MG tablet Take 12.5 mg by mouth daily.    [provider]  triamterene-hydrochlorothiazide (MAXZIDE) 75-50 MG per tablet Take 1 tablet by mouth daily.    [provider]    Inpatient Medications: Scheduled Meds: . ferrous sulfate  325 mg Oral Q breakfast  . lubiprostone  24 mcg Oral BID WC  . mometasone-formoterol  2 puff Inhalation BID  . pantoprazole  40 mg Oral Daily  . simvastatin  40 mg Oral Q1500   Continuous Infusions: . sodium chloride 60 mL/hr at 10/21/20 2101   PRN Meds: acetaminophen **OR**  acetaminophen, cyproheptadine, fluticasone, hydrocortisone, prochlorperazine  Allergies:    Allergies  Allergen Reactions  . Acetaminophen-Codeine Hives  . Allopurinol Hives  . Amlodipine     Felt she retained fluid in her chest   . Amoxicillin Hives    Did it involve swelling of the face/tongue/throat, SOB, or low BP? No Did it involve sudden or severe rash/hives, skin peeling, or any reaction on the inside of your mouth or nose? No Did you need to seek medical attention at a hospital or doctor's office? No When did it last happen?10 + years If all above answers are "NO", may proceed with cephalosporin use.   . Tramadol Hives    Social History:   Social History   Socioeconomic History  . Marital status: Single    Spouse name: Not on file  . Number of children: Not on file  .  Years of education: Not on file  . Highest education level: Not on file  Occupational History  . Occupation: Retired  Tobacco Use  . Smoking status: Former Smoker    Packs/day: 1.00    Years: 20.00    Pack years: 20.00    Types: Cigarettes    Start date: 03/16/1964    Quit date: 10/07/1997    Years since quitting: 23.0  . Smokeless tobacco: Never Used  Vaping Use  . Vaping Use: Never used  Substance and Sexual Activity  . Alcohol use: No    Alcohol/week: 0.0 standard drinks  . Drug use: No  . Sexual activity: Yes    Partners: Male  Other Topics Concern  . Not on file  Social History Narrative  . Not on file   Social Determinants of Health   Financial Resource Strain:   . Difficulty of Paying Living Expenses: Not on file  Food Insecurity:   . Worried About Charity fundraiser in the Last Year: Not on file  . Ran Out of Food in the Last Year: Not on file  Transportation Needs:   . Lack of Transportation (Medical): Not on file  . Lack of Transportation (Non-Medical): Not on file  Physical Activity:   . Days of Exercise per Week: Not on file  . Minutes of Exercise per Session:  Not on file  Stress:   . Feeling of Stress : Not on file  Social Connections:   . Frequency of Communication with Friends and Family: Not on file  . Frequency of Social Gatherings with Friends and Family: Not on file  . Attends Religious Services: Not on file  . Active Member of Clubs or Organizations: Not on file  . Attends Archivist Meetings: Not on file  . Marital Status: Not on file  Intimate Partner Violence:   . Fear of Current or Ex-Partner: Not on file  . Emotionally Abused: Not on file  . Physically Abused: Not on file  . Sexually Abused: Not on file    Family History:    Family History  Problem Relation Age of Onset  . Stroke Mother   . Dementia Father   . Cancer - Other Sister   . Atrial fibrillation Sister   . Heart failure Sister      ROS:  Please see the history of present illness.   All other ROS reviewed and negative.     Physical Exam/Data:   Vitals:   10/21/20 2213 10/22/20 0012 10/22/20 0447 10/22/20 0545  BP:  (!) 165/44 (!) 138/48   Pulse:  68 (!) 55   Resp:  18 20   Temp:  98.3 F (36.8 C) 98.3 F (36.8 C)   TempSrc:  Oral Oral   SpO2: 97% 96% 97%   Weight:    73.5 kg  Height:        Intake/Output Summary (Last 24 hours) at 10/22/2020 0748 Last data filed at 10/22/2020 0449 Gross per 24 hour  Intake 875.95 ml  Output 300 ml  Net 575.95 ml   Last 3 Weights 10/22/2020 10/20/2020 09/15/2020  Weight (lbs) 162 lb 1.6 oz 165 lb 165 lb 9.6 oz  Weight (kg) 73.528 kg 74.844 kg 75.116 kg     Body mass index is 29.65 kg/m.  General:  Well nourished, well developed, in no acute distress. Obese. HEENT: normal Lymph: no adenopathy Neck: no JVD Endocrine:  No thryomegaly Vascular: No carotid bruits; FA pulses 2+ bilaterally without bruits  Cardiac:  normal S1, S2; RRR; no murmur Lungs:  clear to auscultation bilaterally, no wheezing, rhonchi or rales  Abd: soft, nontender, no hepatomegaly  Ext: no edema Musculoskeletal:  No  deformities, BUE and BLE strength normal and equal Skin: warm and dry  Neuro:  no focal abnormalities noted Psych:  Normal affect   Admission ECG personally reviewed shows an idioventricular rhythm with retrograde VA conduction.  Telemetry review since arrival to [facility] shows sinus rhythym with rates in the 60s.  Relevant CV Studies:  05/05/2020 Echo personally reviewed E normal, moderate LVH RV normal LA dilated  08/12/2020 ETT Blood pressure demonstrated a normal response to exercise.  There was no ST segment deviation noted during stress.  Did not reach target heart rate, test is nondiagnostic for ischemia  Peak heart rate 94, no significant QRS prolongation at heart rate achieved. Patient is on flecanide. Sinus rhythm throughout study     Laboratory Data:  High Sensitivity Troponin:   Recent Labs  Lab 10/20/20 1853 10/20/20 2108  TROPONINIHS 29* 27*     Chemistry Recent Labs  Lab 10/20/20 1853 10/21/20 0700  NA 131* 135  K 3.6 3.9  CL 93* 94*  CO2 26 26  GLUCOSE 104* 91  BUN 59* 56*  CREATININE 4.30* 3.98*  CALCIUM 9.6 9.6  GFRNONAA 10* 11*  ANIONGAP 12 15    Recent Labs  Lab 10/21/20 0700  PROT 7.3  ALBUMIN 4.1  AST 13*  ALT 10  ALKPHOS 54  BILITOT 0.9   Hematology Recent Labs  Lab 10/20/20 1853 10/21/20 0753  WBC 7.6 5.9  RBC 3.45* 3.57*  3.66*  HGB 9.1* 9.6*  HCT 29.9* 31.9*  MCV 86.7 89.4  MCH 26.4 26.9  MCHC 30.4 30.1  RDW 17.8* 18.0*  PLT 424* 319   BNP Recent Labs  Lab 10/20/20 1853  BNP 893.0*    DDimer No results for input(s): DDIMER in the last 168 hours.   Radiology/Studies:  DG Chest Port 1 View  Result Date: 10/21/2020 CLINICAL DATA:  Dyspnea. EXAM: PORTABLE CHEST 1 VIEW COMPARISON:  October 20, 2020 FINDINGS: There is no evidence of acute infiltrate, pleural effusion or pneumothorax. The heart size and mediastinal contours are within normal limits. There is moderate severity calcification of the aortic arch. The  visualized skeletal structures are unremarkable. IMPRESSION: No active disease. Electronically Signed   By: Virgina Norfolk M.D.   On: 10/21/2020 20:10   DG Chest Port 1 View  Result Date: 10/20/2020 CLINICAL DATA:  Shortness of breath EXAM: PORTABLE CHEST 1 VIEW COMPARISON:  09/11/2020 FINDINGS: The heart size and mediastinal contours are within normal limits. Both lungs are clear. The visualized skeletal structures are unremarkable. IMPRESSION: No active disease. Electronically Signed   By: Ulyses Jarred M.D.   On: 10/20/2020 20:35     Assessment and Plan:   1. Paroxysmal Atrial Fibrillation c/b Tachy-Brady Syndrome Patient has documented history of paroxysmal atrial fibrillation. Episodes are symptomatic. She has been prescribed Flecainide by Dr Lovena Le and this medication was uptitrated recently. Now presents with symptomatic bradycardia and an idioventricular rhythm at OSH. Now sinus at [facility]. For now, I agree with holding flecainide and clonidine. Plan to monitor her blood pressures closely over the weekend given the patient reports quick rises in her pressure when she stops her antihypertensives. If her pressures increase, I would recommend restarting Clonidine.  It is likely she would have similar symptoms with Amiodarone and other rhythm control agents are unavailable given her  renal disease.  I'd like to see her rhythm on telemetry as the antiarrhythmic and CCB completely wash out. Plan to reassess rhythm and blood pressures through the weekend on Monday to make final decision regarding need for PPM.  Continue holding anticoagulant.     2. AKI on CKD  3. Symptomatic Anemia   For questions or updates, please contact [place] Please consult [email or web address] for contact info under    Signed, Vickie Epley, MD  10/22/2020 7:48 AM

## 2020-10-22 NOTE — Progress Notes (Signed)
PROGRESS NOTE   [name] [name]  EPP:[id number] DOB: August 06, 1946 DOA: 10/20/2020 PCP: Andres Shad, MD   Chief Complaint  Patient presents with  . Weakness    Brief Admission History:  74 y.o. female with medical history significant of GERD, gout, hyperlipidemia, hypertension, history of hypokalemia, PUD, GERD, constipation, hemorrhoid, OSA awaiting for CPAP which will be done early next month who is coming to the emergency department due to progressively worse weakness, associated with postural dizziness for the past 10 days after her flecainide was increased to 100 mg p.o. twice daily a few days before her symptoms started.  She denies chest pain, palpitations, diaphoresis, PND, orthopnea or recent pitting edema of the lower extremities.  No fever, rhinorrhea, sore throat, wheezing or hemoptysis.  She denies abdominal pain, nausea, emesis, diarrhea, melena or hematochezia.  She states she gets frequently constipated.  She denies dysuria, frequency or hematuria.  ED Course: Initial vital signs were temperature 97.7 F, pulse 35, respirations 17, BP 165/51 mmHg and O2 sat 100% on room air.  While in the ER, Dr. Olevia Bowens ordered atropine 0.5 mg IVP twice.  Assessment & Plan:   Principal Problem:   Symptomatic bradycardia Active Problems:   Essential hypertension, benign   GERD (gastroesophageal reflux disease)   Hyperlipidemia   Gout   Paroxysmal atrial fibrillation (HCC)   OSA (obstructive sleep apnea)   AKI (acute kidney injury) ([place])   Microcytic anemia   1. Tachy brady syndrome - Pt presents with symptomatic bradycardia in setting of paroxysmal atrial fibrillation.  Cardiology on board and appreciate their help.  Holding flecainide for now per cardiology recs.  Pt may require pacemaker placement.  Apixaban temporarily on hold as well.  2. AKI on CKD stage 3a - creatinine improved with IV fluids yesterday.  No labs today.  Ordering BMP.  Remains on gentle  hydration. 3. Paroxysmal atrial fibrillation - Pt has poor tolerance to AV nodal agents. Temporarily holding apixaban in case pacemaker placement is required.  Will defer management to cardiology. 4. Anemia in CKD and iron deficiency anemia-iron panel indicates combination of anemia of chronic disease and iron deficiency anemia.  Hemoglobin over 8.  No indication of transfusion.  Monitor daily. 5. OSA - pt reports arrangements being made for her to have a home CPAP.  6. Essential hypertension: Cardiology has stopped her clonidine.  Her blood pressure slightly elevated.  Will place her on as needed hydralazine in the meantime. 7. Hyperlipidemia -continue simvastatin. 8. Gout - colchicine as needed.   DVT prophylaxis: SCDs Code Status: full  Family Communication: plan of care discussed with pt at bedside, verbalized understanding Disposition: Monitor over the weekend per cardiology recommendation.  Status is: Inpatient  Remains inpatient appropriate because:Hemodynamically unstable and Inpatient level of care appropriate due to severity of illness   Dispo: The patient is from: Home              Anticipated d/c is to: Home              Anticipated d/c date is: 3 days              Patient currently is not medically stable to d/c.  Consultants:   cardiology  Procedures:   n/a  Antimicrobials:  n/a  Subjective: Seen and examined.  Complains of mild headache.  She has no other complaint.  Denied any chest pain, palpitation, shortness of breath or dizziness.  Per cardiology note, monitor over the weekend and reassess for  possible PPM on Monday however patient was under the impression that she was cleared by cardiology to go home today.  I verified this plan with Dr. Quentin Ore and conveyed to the patient.  Objective: Vitals:   10/22/20 0447 10/22/20 0545 10/22/20 0749 10/22/20 0750  BP: (!) 138/48   (!) 151/64  Pulse: (!) 55   64  Resp: 20   18  Temp: 98.3 F (36.8 C)   98.4 F (36.9  C)  TempSrc: Oral   Oral  SpO2: 97%  97% 98%  Weight:  73.5 kg    Height:        Intake/Output Summary (Last 24 hours) at 10/22/2020 1035 Last data filed at 10/22/2020 0449 Gross per 24 hour  Intake 875.95 ml  Output 300 ml  Net 575.95 ml   Filed Weights   10/20/20 1528 10/22/20 0545  Weight: 74.8 kg 73.5 kg    Examination:  General exam: Appears calm and comfortable  Respiratory system: Clear to auscultation. Respiratory effort normal. Cardiovascular system: S1 & S2 heard, irregularly irregular rate and rhythm. No JVD, murmurs, rubs, gallops or clicks. No pedal edema. Gastrointestinal system: Abdomen is nondistended, soft and nontender. No organomegaly or masses felt. Normal bowel sounds heard. Central nervous system: Alert and oriented. No focal neurological deficits. Extremities: Symmetric 5 x 5 power. Skin: No rashes, lesions or ulcers.  Psychiatry: Judgement and insight appear normal. Mood & affect appropriate.   Data Reviewed: I have personally reviewed following labs and imaging studies  CBC: Recent Labs  Lab 10/20/20 1853 10/21/20 0753  WBC 7.6 5.9  HGB 9.1* 9.6*  HCT 29.9* 31.9*  MCV 86.7 89.4  PLT 424* 115    Basic Metabolic Panel: Recent Labs  Lab 10/20/20 1853 10/21/20 0700  NA 131* 135  K 3.6 3.9  CL 93* 94*  CO2 26 26  GLUCOSE 104* 91  BUN 59* 56*  CREATININE 4.30* 3.98*  CALCIUM 9.6 9.6    GFR: Estimated Creatinine Clearance: 11.6 mL/min (A) (by C-G formula based on SCr of 3.98 mg/dL (H)).  Liver Function Tests: Recent Labs  Lab 10/21/20 0700  AST 13*  ALT 10  ALKPHOS 54  BILITOT 0.9  PROT 7.3  ALBUMIN 4.1    CBG: No results for input(s): GLUCAP in the last 168 hours.  Recent Results (from the past 240 hour(s))  Respiratory Panel by RT PCR (Flu A&B, Covid) - Nasopharyngeal Swab     Status: None   Collection Time: 10/20/20  9:49 PM   Specimen: Nasopharyngeal Swab  Result Value Ref Range Status   SARS Coronavirus 2 by RT  PCR NEGATIVE NEGATIVE Final    Comment: (NOTE) SARS-CoV-2 target nucleic acids are NOT DETECTED.  The SARS-CoV-2 RNA is generally detectable in upper respiratoy specimens during the acute phase of infection. The lowest concentration of SARS-CoV-2 viral copies this assay can detect is 131 copies/mL. A negative result does not preclude SARS-Cov-2 infection and should not be used as the sole basis for treatment or other patient management decisions. A negative result may occur with  improper specimen collection/handling, submission of specimen other than nasopharyngeal swab, presence of viral mutation(s) within the areas targeted by this assay, and inadequate number of viral copies (<131 copies/mL). A negative result must be combined with clinical observations, patient history, and epidemiological information. The expected result is Negative.  Fact Sheet for Patients:  PinkCheek.be  Fact Sheet for Healthcare Providers:  GravelBags.it  This test is no t yet approved or cleared  by the [place] and  has been authorized for detection and/or diagnosis of SARS-CoV-2 by FDA under an Emergency Use Authorization (EUA). This EUA will remain  in effect (meaning this test can be used) for the duration of the COVID-19 declaration under Section 564(b)(1) of the Act, 21 U.S.C. section 360bbb-3(b)(1), unless the authorization is terminated or revoked sooner.     Influenza A by PCR NEGATIVE NEGATIVE Final   Influenza B by PCR NEGATIVE NEGATIVE Final    Comment: (NOTE) The Xpert Xpress SARS-CoV-2/FLU/RSV assay is intended as an aid in  the diagnosis of influenza from Nasopharyngeal swab specimens and  should not be used as a sole basis for treatment. Nasal washings and  aspirates are unacceptable for Xpert Xpress SARS-CoV-2/FLU/RSV  testing.  Fact Sheet for Patients: PinkCheek.be  Fact Sheet for  Healthcare Providers: GravelBags.it  This test is not yet approved or cleared by the [place] FDA and  has been authorized for detection and/or diagnosis of SARS-CoV-2 by  FDA under an Emergency Use Authorization (EUA). This EUA will remain  in effect (meaning this test can be used) for the duration of the  Covid-19 declaration under Section 564(b)(1) of the Act, 21  U.S.C. section 360bbb-3(b)(1), unless the authorization is  terminated or revoked. Performed at [facility], [address] [place], [place] [address]      Radiology Studies: [facility] Chest [facility] 1 View  Result Date: 10/21/2020 CLINICAL DATA:  Dyspnea. EXAM: PORTABLE CHEST 1 VIEW COMPARISON:  October 20, 2020 FINDINGS: There is no evidence of acute infiltrate, pleural effusion or pneumothorax. The heart size and mediastinal contours are within normal limits. There is moderate severity calcification of the aortic arch. The visualized skeletal structures are unremarkable. IMPRESSION: No active disease. Electronically Signed   By: Virgina Norfolk M.D.   On: 10/21/2020 20:10   DG Chest Port 1 View  Result Date: 10/20/2020 CLINICAL DATA:  Shortness of breath EXAM: PORTABLE CHEST 1 VIEW COMPARISON:  09/11/2020 FINDINGS: The heart size and mediastinal contours are within normal limits. Both lungs are clear. The visualized skeletal structures are unremarkable. IMPRESSION: No active disease. Electronically Signed   By: Ulyses Jarred M.D.   On: 10/20/2020 20:35   Scheduled Meds: . ferrous sulfate  325 mg Oral Q breakfast  . lubiprostone  24 mcg Oral BID WC  . mometasone-formoterol  2 puff Inhalation BID  . pantoprazole  40 mg Oral Daily  . simvastatin  40 mg Oral Q1500   Continuous Infusions: . sodium chloride 60 mL/hr at 10/21/20 2101     LOS: 1 day   Time spent: 35 minutes   Darliss Cheney, MD How to contact the [facility] Attending or Consulting provider [place] or covering provider during after  hours [place], for this patient?  1. Check the care team in [facility] and look for a) attending/consulting [facility] provider listed and b) the [facility] team listed 2. Log into [email or web address] and use Eugenio Saenz's universal password to access. If you do not have the password, please contact the hospital operator. 3. Locate the [facility] provider you are looking for under [facility] and page to a number that you can be directly reached. 4. If you still have difficulty reaching the provider, please page the [facility] (Director on Call) for the Hospitalists listed on amion for assistance.  10/22/2020, 10:35 AM

## 2020-10-23 LAB — RENAL FUNCTION PANEL
Albumin: 3.3 g/dL — ABNORMAL LOW (ref 3.5–5.0)
Anion gap: 12 (ref 5–15)
BUN: 31 mg/dL — ABNORMAL HIGH (ref 8–23)
CO2: 24 mmol/L (ref 22–32)
Calcium: 9.1 mg/dL (ref 8.9–10.3)
Chloride: 102 mmol/L (ref 98–111)
Creatinine, Ser: 2.71 mg/dL — ABNORMAL HIGH (ref 0.44–1.00)
GFR, Estimated: 18 mL/min — ABNORMAL LOW (ref >60)
Glucose, Bld: 122 mg/dL — ABNORMAL HIGH (ref 70–99)
Phosphorus: 2.9 mg/dL (ref 2.5–4.6)
Potassium: 2.9 mmol/L — ABNORMAL LOW (ref 3.5–5.1)
Sodium: 138 mmol/L (ref 135–145)

## 2020-10-23 LAB — MAGNESIUM: Magnesium: 2.1 mg/dL (ref 1.7–2.4)

## 2020-10-23 MED ORDER — POTASSIUM CHLORIDE CRYS ER 20 MEQ PO TBCR
40.0000 meq | EXTENDED_RELEASE_TABLET | ORAL | Status: AC
  Administered 2020-10-23 (×2): 40 meq via ORAL
  Filled 2020-10-23 (×2): qty 2

## 2020-10-23 NOTE — Progress Notes (Addendum)
Dr. Tonie Griffith paged at [phone] re: K+ 2.9.  Pt states she takes KCl 20 mEq TID at home and has not been getting it in the hospital.  No return call yet.  Shift report handed off to [name], RN who is assuming care for the pt.  [name] [name]

## 2020-10-23 NOTE — Progress Notes (Signed)
PROGRESS NOTE   [name] [name]  CHY:[id number] DOB: [birth year] DOA: 10/20/2020 PCP: Andres Shad, MD   Chief Complaint  Patient presents with  . Weakness    Brief Admission History:  74 y.o. female with medical history significant of GERD, gout, hyperlipidemia, hypertension, history of hypokalemia, PUD, GERD, constipation, hemorrhoid, OSA awaiting for CPAP which will be done early next month who is coming to the emergency department due to progressively worse weakness, associated with postural dizziness for the past 10 days after her flecainide was increased to 100 mg p.o. twice daily a few days before her symptoms started.  She denies chest pain, palpitations, diaphoresis, PND, orthopnea or recent pitting edema of the lower extremities.  No fever, rhinorrhea, sore throat, wheezing or hemoptysis.  She denies abdominal pain, nausea, emesis, diarrhea, melena or hematochezia.  She states she gets frequently constipated.  She denies dysuria, frequency or hematuria.  ED Course: Initial vital signs were temperature 97.7 F, pulse 35, respirations 17, BP 165/51 mmHg and O2 sat 100% on room air.  While in the ER, Dr. Olevia Bowens ordered atropine 0.5 mg IVP twice.  Assessment & Plan:   Principal Problem:   Symptomatic bradycardia Active Problems:   Essential hypertension, benign   GERD (gastroesophageal reflux disease)   Hyperlipidemia   Gout   Paroxysmal atrial fibrillation (HCC)   OSA (obstructive sleep apnea)   AKI (acute kidney injury) ([place])   Microcytic anemia   1. Tachy brady syndrome - Pt presents with symptomatic bradycardia in setting of paroxysmal atrial fibrillation.  Cardiology on board and appreciate their help.  Holding flecainide for now per cardiology recs.  Pt may require pacemaker placement.  Cardiology to further decide tomorrow.  Apixaban temporarily on hold as well.  2. AKI on CKD stage 3a - creatinine improved with IV fluids again today but creatinine is still higher  than baseline.  Continue gentle hydration. 3. Paroxysmal atrial fibrillation - Pt has poor tolerance to AV nodal agents. Temporarily holding apixaban in case pacemaker placement is required.  Will defer management to cardiology. 4. Anemia in CKD and iron deficiency anemia-iron panel indicates combination of anemia of chronic disease and iron deficiency anemia.  Hemoglobin over 8.  No indication of transfusion.  Monitor daily. 5. OSA - pt reports arrangements being made for her to have a home CPAP.  6. Essential hypertension: Cardiology has stopped her clonidine.  Her blood pressure slightly elevated at times.  Continue as needed IV hydralazine for now. 7. Hyperlipidemia -continue simvastatin. 8. Gout - colchicine as needed.  9. Hypokalemia: 2.9.  Will replace orally.  Recheck in the morning.  Magnesium normal.  DVT prophylaxis: SCDs Code Status: full  Family Communication: plan of care discussed with pt at bedside, verbalized understanding Disposition: Monitor over the weekend per cardiology recommendation.  May discharge home if cleared by cardiology tomorrow.  Status is: Inpatient  Remains inpatient appropriate because:Hemodynamically unstable and Inpatient level of care appropriate due to severity of illness   Dispo: The patient is from: Home              Anticipated d/c is to: Home              Anticipated d/c date is: 1 day              Patient currently is not medically stable to d/c.  Will be discharged once cleared by cardiology.  Consultants:   cardiology  Procedures:   n/a  Antimicrobials:  n/a  Subjective: Seen and examined.  Complains of some headache but no other complaint.  Denied any chest pain, palpitation or shortness of breath.  Objective: Vitals:   10/23/20 0051 10/23/20 0533 10/23/20 0544 10/23/20 0714  BP: (!) 137/49 (!) 153/52  (!) 152/55  Pulse:    68  Resp:  20  19  Temp:  99.3 F (37.4 C)  98.8 F (37.1 C)  TempSrc:  Oral  Oral  SpO2:  99%  98%   Weight:   74.8 kg   Height:        Intake/Output Summary (Last 24 hours) at 10/23/2020 1137 Last data filed at 10/23/2020 0053 Gross per 24 hour  Intake 1515.11 ml  Output 400 ml  Net 1115.11 ml   Filed Weights   10/20/20 1528 10/22/20 0545 10/23/20 0544  Weight: 74.8 kg 73.5 kg 74.8 kg    Examination:  General exam: Appears calm and comfortable  Respiratory system: Clear to auscultation. Respiratory effort normal. Cardiovascular system: S1 & S2 heard, RRR. No JVD, murmurs, rubs, gallops or clicks. No pedal edema. Gastrointestinal system: Abdomen is nondistended, soft and nontender. No organomegaly or masses felt. Normal bowel sounds heard. Central nervous system: Alert and oriented. No focal neurological deficits. Extremities: Symmetric 5 x 5 power. Skin: No rashes, lesions or ulcers.  Psychiatry: Judgement and insight appear normal. Mood & affect appropriate.   Data Reviewed: I have personally reviewed following labs and imaging studies  CBC: Recent Labs  Lab 10/20/20 1853 10/21/20 0753 10/22/20 1506  WBC 7.6 5.9 8.5  NEUTROABS  --   --  5.6  HGB 9.1* 9.6* 9.1*  HCT 29.9* 31.9* 29.8*  MCV 86.7 89.4 88.4  PLT 424* 319 626    Basic Metabolic Panel: Recent Labs  Lab 10/20/20 1853 10/21/20 0700 10/22/20 1506 10/23/20 0357  NA 131* 135 138 138  K 3.6 3.9 3.1* 2.9*  CL 93* 94* 100 102  CO2 26 26 24 24   GLUCOSE 104* 91 109* 122*  BUN 59* 56* 38* 31*  CREATININE 4.30* 3.98* 3.00* 2.71*  CALCIUM 9.6 9.6 9.5 9.1  MG  --   --  2.3 2.1  PHOS  --   --   --  2.9    GFR: Estimated Creatinine Clearance: 17.3 mL/min (A) (by C-G formula based on SCr of 2.71 mg/dL (H)).  Liver Function Tests: Recent Labs  Lab 10/21/20 0700 10/23/20 0357  AST 13*  --   ALT 10  --   ALKPHOS 54  --   BILITOT 0.9  --   PROT 7.3  --   ALBUMIN 4.1 3.3*    CBG: No results for input(s): GLUCAP in the last 168 hours.  Recent Results (from the past 240 hour(s))  Respiratory  Panel by RT PCR (Flu A&B, Covid) - Nasopharyngeal Swab     Status: None   Collection Time: 10/20/20  9:49 PM   Specimen: Nasopharyngeal Swab  Result Value Ref Range Status   SARS Coronavirus 2 by RT PCR NEGATIVE NEGATIVE Final    Comment: (NOTE) SARS-CoV-2 target nucleic acids are NOT DETECTED.  The SARS-CoV-2 RNA is generally detectable in upper respiratoy specimens during the acute phase of infection. The lowest concentration of SARS-CoV-2 viral copies this assay can detect is 131 copies/mL. A negative result does not preclude SARS-Cov-2 infection and should not be used as the sole basis for treatment or other patient management decisions. A negative result may occur with  improper specimen collection/handling, submission of specimen other  than nasopharyngeal swab, presence of viral mutation(s) within the areas targeted by this assay, and inadequate number of viral copies (<131 copies/mL). A negative result must be combined with clinical observations, patient history, and epidemiological information. The expected result is Negative.  Fact Sheet for Patients:  PinkCheek.be  Fact Sheet for Healthcare Providers:  GravelBags.it  This test is no t yet approved or cleared by the [place] FDA and  has been authorized for detection and/or diagnosis of SARS-CoV-2 by FDA under an Emergency Use Authorization (EUA). This EUA will remain  in effect (meaning this test can be used) for the duration of the COVID-19 declaration under Section 564(b)(1) of the Act, 21 U.S.C. section 360bbb-3(b)(1), unless the authorization is terminated or revoked sooner.     Influenza A by PCR NEGATIVE NEGATIVE Final   Influenza B by PCR NEGATIVE NEGATIVE Final    Comment: (NOTE) The Xpert Xpress SARS-CoV-2/FLU/RSV assay is intended as an aid in  the diagnosis of influenza from Nasopharyngeal swab specimens and  should not be used as a sole basis  for treatment. Nasal washings and  aspirates are unacceptable for Xpert Xpress SARS-CoV-2/FLU/RSV  testing.  Fact Sheet for Patients: PinkCheek.be  Fact Sheet for Healthcare Providers: GravelBags.it  This test is not yet approved or cleared by the [place] FDA and  has been authorized for detection and/or diagnosis of SARS-CoV-2 by  FDA under an Emergency Use Authorization (EUA). This EUA will remain  in effect (meaning this test can be used) for the duration of the  Covid-19 declaration under Section 564(b)(1) of the Act, 21  U.S.C. section 360bbb-3(b)(1), unless the authorization is  terminated or revoked. Performed at [facility], [address], [place], [place] [address]      Radiology Studies: [facility] 1 View  Result Date: 10/21/2020 CLINICAL DATA:  Dyspnea. EXAM: PORTABLE CHEST 1 VIEW COMPARISON:  October 20, 2020 FINDINGS: There is no evidence of acute infiltrate, pleural effusion or pneumothorax. The heart size and mediastinal contours are within normal limits. There is moderate severity calcification of the aortic arch. The visualized skeletal structures are unremarkable. IMPRESSION: No active disease. Electronically Signed   By: Virgina Norfolk M.D.   On: 10/21/2020 20:10   Scheduled Meds: . ferrous sulfate  325 mg Oral Q breakfast  .  HYDROmorphone (DILAUDID) injection  0.5 mg Intravenous Once  . lubiprostone  24 mcg Oral BID WC  . mometasone-formoterol  2 puff Inhalation BID  . pantoprazole  40 mg Oral Daily  . potassium chloride  40 mEq Oral Q4H  . simvastatin  40 mg Oral Q1500   Continuous Infusions: . sodium chloride 60 mL/hr at 10/21/20 2101     LOS: 2 days   Time spent: 30 minutes   Darliss Cheney, MD How to contact the [facility] Attending or Consulting provider [place] or covering provider during after hours [place], for this patient?  1. Check the care team in [facility] and look for a)  attending/consulting [facility] provider listed and b) the [facility] team listed 2. Log into [email or web address] and use [place]'s universal password to access. If you do not have the password, please contact the hospital operator. 3. Locate the [facility] provider you are looking for under [facility] and page to a number that you can be directly reached. 4. If you still have difficulty reaching the provider, please page the [facility] (Director on Call) for the Hospitalists listed on amion for assistance.  10/23/2020, 11:37 AM

## 2020-10-24 ENCOUNTER — Encounter: Payer: Self-pay | Admitting: Internal Medicine

## 2020-10-24 ENCOUNTER — Encounter (HOSPITAL_COMMUNITY)
Admission: RE | Admit: 2020-10-24 | Discharge: 2020-10-24 | Disposition: A | Discharge status: Home / Self Care | Payer: Medicare HMO | Source: Ambulatory Visit | Attending: Nephrology | Admitting: Nephrology

## 2020-10-24 DIAGNOSIS — R001 Bradycardia, unspecified: Secondary | ICD-10-CM | POA: Diagnosis not present

## 2020-10-24 LAB — RENAL FUNCTION PANEL
Albumin: 3.5 g/dL (ref 3.5–5.0)
Anion gap: 14 (ref 5–15)
BUN: 23 mg/dL (ref 8–23)
CO2: 26 mmol/L (ref 22–32)
Calcium: 9.2 mg/dL (ref 8.9–10.3)
Chloride: 101 mmol/L (ref 98–111)
Creatinine, Ser: 2.62 mg/dL — ABNORMAL HIGH (ref 0.44–1.00)
GFR, Estimated: 19 mL/min — ABNORMAL LOW (ref >60)
Glucose, Bld: 111 mg/dL — ABNORMAL HIGH (ref 70–99)
Phosphorus: 2.3 mg/dL — ABNORMAL LOW (ref 2.5–4.6)
Potassium: 3.2 mmol/L — ABNORMAL LOW (ref 3.5–5.1)
Sodium: 141 mmol/L (ref 135–145)

## 2020-10-24 LAB — CBC WITH DIFFERENTIAL/PLATELET
Abs Immature Granulocytes: 0.03 10*3/uL (ref 0.00–0.07)
Basophils Absolute: 0 10*3/uL (ref 0.0–0.1)
Basophils Relative: 0 %
Eosinophils Absolute: 0.2 10*3/uL (ref 0.0–0.5)
Eosinophils Relative: 2 %
HCT: 29.8 % — ABNORMAL LOW (ref 36.0–46.0)
Hemoglobin: 8.9 g/dL — ABNORMAL LOW (ref 12.0–15.0)
Immature Granulocytes: 0 %
Lymphocytes Relative: 18 %
Lymphs Abs: 1.7 10*3/uL (ref 0.7–4.0)
MCH: 26.8 pg (ref 26.0–34.0)
MCHC: 29.9 g/dL — ABNORMAL LOW (ref 30.0–36.0)
MCV: 89.8 fL (ref 80.0–100.0)
Monocytes Absolute: 1.1 10*3/uL — ABNORMAL HIGH (ref 0.1–1.0)
Monocytes Relative: 12 %
Neutro Abs: 6.4 10*3/uL (ref 1.7–7.7)
Neutrophils Relative %: 68 %
Platelets: 374 10*3/uL (ref 150–400)
RBC: 3.32 MIL/uL — ABNORMAL LOW (ref 3.87–5.11)
RDW: 18.3 % — ABNORMAL HIGH (ref 11.5–15.5)
WBC: 9.5 10*3/uL (ref 4.0–10.5)
nRBC: 0 % (ref 0.0–0.2)

## 2020-10-24 LAB — MAGNESIUM: Magnesium: 2 mg/dL (ref 1.7–2.4)

## 2020-10-24 MED ORDER — FLECAINIDE ACETATE 50 MG PO TABS
50.0000 mg | ORAL_TABLET | Freq: Two times a day (BID) | ORAL | Status: DC
  Administered 2020-10-24 – 2020-10-25 (×2): 50 mg via ORAL
  Filled 2020-10-24 (×2): qty 1

## 2020-10-24 MED ORDER — FLECAINIDE ACETATE 50 MG PO TABS
75.0000 mg | ORAL_TABLET | Freq: Two times a day (BID) | ORAL | Status: DC
  Administered 2020-10-24: 75 mg via ORAL
  Filled 2020-10-24: qty 2

## 2020-10-24 MED ORDER — POTASSIUM CHLORIDE CRYS ER 20 MEQ PO TBCR
40.0000 meq | EXTENDED_RELEASE_TABLET | ORAL | Status: AC
  Administered 2020-10-24 (×2): 40 meq via ORAL
  Filled 2020-10-24 (×2): qty 2

## 2020-10-24 MED ORDER — SODIUM CHLORIDE 0.9 % IV SOLN
510.0000 mg | Freq: Once | INTRAVENOUS | Status: DC
  Filled 2020-10-24: qty 17

## 2020-10-24 MED ORDER — SODIUM CHLORIDE 0.9 % IV SOLN: Freq: Once | INTRAVENOUS | Status: DC

## 2020-10-24 NOTE — Progress Notes (Addendum)
Electrophysiology Rounding Note  Patient Name: [name] [name] Date of Encounter: 10/24/2020  Primary Cardiologist: Rozann Lesches, MD Electrophysiologist: Dr. Lovena Le   Subjective   She is feeling well this am. HRs have stabilized in 60-90s off flecainide. She was only taking diltiazem 1-2 times a month. At this time, the patient denies chest pain, shortness of breath, or any new concerns.  Inpatient Medications    Scheduled Meds: . ferrous sulfate  325 mg Oral Q breakfast  .  HYDROmorphone (DILAUDID) injection  0.5 mg Intravenous Once  . lubiprostone  24 mcg Oral BID WC  . mometasone-formoterol  2 puff Inhalation BID  . pantoprazole  40 mg Oral Daily  . simvastatin  40 mg Oral Q1500   Continuous Infusions: . sodium chloride 60 mL/hr at 10/23/20 1235   PRN Meds: acetaminophen **OR** acetaminophen, alum & mag hydroxide-simeth, cyproheptadine, fluticasone, hydrALAZINE, hydrocortisone, prochlorperazine   Vital Signs    Vitals:   10/23/20 1627 10/23/20 2058 10/23/20 2250 10/24/20 0500  BP: (!) 177/67  (!) 160/68 (!) 142/55  Pulse:    67  Resp:   18 18  Temp:   98.5 F (36.9 C) 99 F (37.2 C)  TempSrc:   Oral Oral  SpO2:  98% 99% 100%  Weight:      Height:        Intake/Output Summary (Last 24 hours) at 10/24/2020 0807 Last data filed at 10/24/2020 0500 Gross per 24 hour  Intake 1718.3 ml  Output 300 ml  Net 1418.3 ml   Filed Weights   10/20/20 1528 10/22/20 0545 10/23/20 0544  Weight: 74.8 kg 73.5 kg 74.8 kg    Physical Exam    GEN- The patient is well appearing, alert and oriented x 3 today.   Head- normocephalic, atraumatic Eyes-  Sclera clear, conjunctiva pink Ears- hearing intact Oropharynx- clear Neck- supple Lungs- Clear to ausculation bilaterally, normal work of breathing Heart- Regular rate and rhythm, no murmurs, rubs or gallops GI- soft, NT, ND, + BS Extremities- no clubbing or cyanosis. No edema Skin- no rash or lesion Psych- euthymic  mood, full affect Neuro- strength and sensation are intact  Labs    CBC Recent Labs    10/22/20 1506  WBC 8.5  NEUTROABS 5.6  HGB 9.1*  HCT 29.8*  MCV 88.4  PLT 950   Basic Metabolic Panel Recent Labs    10/22/20 1506 10/23/20 0357  NA 138 138  K 3.1* 2.9*  CL 100 102  CO2 24 24  GLUCOSE 109* 122*  BUN 38* 31*  CREATININE 3.00* 2.71*  CALCIUM 9.5 9.1  MG 2.3 2.1  PHOS  --  2.9   Liver Function Tests Recent Labs    10/23/20 0357  ALBUMIN 3.3*   No results for input(s): LIPASE, AMYLASE in the last 72 hours. Cardiac Enzymes No results for input(s): CKTOTAL, CKMB, CKMBINDEX, TROPONINI in the last 72 hours.   Telemetry    NSR 60-90s (personally reviewed)  Admission EKG:   Radiology    No results found.  Patient Profile     [name] [name] is a 74 y.o. female with a hx of paroxysmal atrial fibrillation, HTN, HLD, OSA, CKD who is being seen today for the evaluation of bradycardia at the request of Carlyle Dolly and Dr Doristine Bosworth.  Assessment & Plan    1. Bradycardia This has improved off flecainide, as well as with resolution of her AKI. She was not taking diltiazem regularly.  ETT 07/2020 showed no significant  EKG changes while on flecainide.  2. Paroxysmal atrial fibrillation Her symptoms have essentially resolved on flecainide, but now off.  Will discuss disposition with Dr. Lovena Le She is on Eliquis 5 mg BID, which has been held in the event a pacer is pursued.   3. HTN Elevated currently. Will adjust meds pending disposition.   4. AKI ? In setting of bradycardia. This is gradually improving.   5. Hypokalemia K 2.9 yesterday. Supped and pending this am.   Discussed with Dr. Lovena Le: She tolerated flecainide 75 mg BID well, but did have occasional breakthrough AF.   We will re-challenge on this lower dose of flecainide and observe at least overnight for further plan.   Addendum:  Given AKI, will reduce dose to 50 mg BID for now and follow  renal function.  For questions or updates, please contact [place] Please consult [email or web address] for contact info under Cardiology/STEMI.  Signed, Jaunice Friar, PA-C  10/24/2020, 8:07 AM   EP Attending  Patient seen and examined. Agree with the findings as noted above. The patient is feeling back to baseline. She has developed worsening renal insufficiency which appears to be improving. She initially tolerated a lower dose of flecainide but had some break through afib before her dose was increased. Her bradycardia is due to flecainide. I have recommended she be rechallenged at a lower dose as noted above. She will restart her Eliquis. If she reverts back to rapid atrial fib, she will require PPM and at that point I would probably switch to amiodarone.   Carleene Overlie Soila Printup,MD

## 2020-10-24 NOTE — Progress Notes (Signed)
PROGRESS NOTE   [name] [name]  RXV:[id number] DOB: [birth year] DOA: 10/20/2020 PCP: Andres Shad, MD   Chief Complaint  Patient presents with  . Weakness    Brief Admission History:  74 y.o. female with medical history significant of GERD, gout, hyperlipidemia, hypertension, history of hypokalemia, PUD, GERD, constipation, hemorrhoid, OSA awaiting for CPAP which will be done early next month who is coming to the emergency department due to progressively worse weakness, associated with postural dizziness for the past 10 days after her flecainide was increased to 100 mg p.o. twice daily a few days before her symptoms started.  She denies chest pain, palpitations, diaphoresis, PND, orthopnea or recent pitting edema of the lower extremities.  No fever, rhinorrhea, sore throat, wheezing or hemoptysis.  She denies abdominal pain, nausea, emesis, diarrhea, melena or hematochezia.  She states she gets frequently constipated.  She denies dysuria, frequency or hematuria.  ED Course: Initial vital signs were temperature 97.7 F, pulse 35, respirations 17, BP 165/51 mmHg and O2 sat 100% on room air.  While in the ER, Dr. Olevia Bowens ordered atropine 0.5 mg IVP twice.  Assessment & Plan:   Principal Problem:   Symptomatic bradycardia Active Problems:   Essential hypertension, benign   GERD (gastroesophageal reflux disease)   Hyperlipidemia   Gout   Paroxysmal atrial fibrillation (HCC)   OSA (obstructive sleep apnea)   AKI (acute kidney injury) ([place])   Microcytic anemia   1. Tachy brady syndrome - Pt presents with symptomatic bradycardia in setting of paroxysmal atrial fibrillation.  Flecainide was stopped but cardiology plans to rechallenge today and observe overnight.  Eliquis still on hold in the meantime.  Appreciate cardiology help and await for further plans. 2. AKI on CKD stage 3a - creatinine improved with IV fluids again today but very minimal and it is still higher than baseline.   Continue gentle hydration. 3. Paroxysmal atrial fibrillation - Pt has poor tolerance to AV nodal agents. Temporarily holding apixaban in case pacemaker placement is required.  Will defer management to cardiology. 4. Anemia in CKD and iron deficiency anemia-iron panel indicates combination of anemia of chronic disease and iron deficiency anemia.  Hemoglobin over 8.  No indication of transfusion.  Monitor daily.  She tells me that she was scheduled to have iron infusion at her nephrology clinic today and was asking if she can get it here.  Since her hemoglobin has remained stable, this can wait.  Patient understanding. 5. OSA - pt reports arrangements being made for her to have a home CPAP.  6. Essential hypertension: Cardiology has stopped her clonidine.  Her blood pressure slightly elevated at times but controlled for most part.  Continue as needed IV hydralazine for now. 7. Hyperlipidemia -continue simvastatin. 8. Gout - colchicine as needed.  9. Hypokalemia: 3.2.  Will replace orally.  Recheck in the morning.    DVT prophylaxis: SCDs Code Status: full  Family Communication: plan of care discussed with pt at bedside, verbalized understanding Disposition: Per cardiology, plan to observe overnight on low-dose flecainide.  If all goes well, discharge home tomorrow.  Status is: Inpatient  Remains inpatient appropriate because:Hemodynamically unstable and Inpatient level of care appropriate due to severity of illness   Dispo: The patient is from: Home              Anticipated d/c is to: Home              Anticipated d/c date is: 1 day  Patient currently is not medically stable to d/c.  Will be discharged once cleared by cardiology.  Consultants:   cardiology  Procedures:   n/a  Antimicrobials:  n/a  Subjective: Seen and examined.  Denies any complaint.  Denies shortness of breath, palpitation or chest pain.  Objective: Vitals:   10/23/20 2058 10/23/20 2250 10/24/20  0500 10/24/20 0833  BP:  (!) 160/68 (!) 142/55 (!) 142/55  Pulse:   67 74  Resp:  18 18 18   Temp:  98.5 F (36.9 C) 99 F (37.2 C) 99.1 F (37.3 C)  TempSrc:  Oral Oral Oral  SpO2: 98% 99% 100% 100%  Weight:      Height:        Intake/Output Summary (Last 24 hours) at 10/24/2020 1152 Last data filed at 10/24/2020 1000 Gross per 24 hour  Intake 2554.39 ml  Output 300 ml  Net 2254.39 ml   Filed Weights   10/20/20 1528 10/22/20 0545 10/23/20 0544  Weight: 74.8 kg 73.5 kg 74.8 kg    Examination:  General exam: Appears calm and comfortable  Respiratory system: Clear to auscultation. Respiratory effort normal. Cardiovascular system: S1 & S2 heard, RRR. No JVD, murmurs, rubs, gallops or clicks. No pedal edema. Gastrointestinal system: Abdomen is nondistended, soft and nontender. No organomegaly or masses felt. Normal bowel sounds heard. Central nervous system: Alert and oriented. No focal neurological deficits. Extremities: Symmetric 5 x 5 power. Skin: No rashes, lesions or ulcers.  Psychiatry: Judgement and insight appear normal. Mood & affect appropriate.   Data Reviewed: I have personally reviewed following labs and imaging studies  CBC: Recent Labs  Lab 10/20/20 1853 10/21/20 0753 10/22/20 1506  WBC 7.6 5.9 8.5  NEUTROABS  --   --  5.6  HGB 9.1* 9.6* 9.1*  HCT 29.9* 31.9* 29.8*  MCV 86.7 89.4 88.4  PLT 424* 319 941    Basic Metabolic Panel: Recent Labs  Lab 10/20/20 1853 10/21/20 0700 10/22/20 1506 10/23/20 0357 10/24/20 0940  NA 131* 135 138 138 141  K 3.6 3.9 3.1* 2.9* 3.2*  CL 93* 94* 100 102 101  CO2 26 26 24 24 26   GLUCOSE 104* 91 109* 122* 111*  BUN 59* 56* 38* 31* 23  CREATININE 4.30* 3.98* 3.00* 2.71* 2.62*  CALCIUM 9.6 9.6 9.5 9.1 9.2  MG  --   --  2.3 2.1 2.0  PHOS  --   --   --  2.9 2.3*    GFR: Estimated Creatinine Clearance: 17.8 mL/min (A) (by C-G formula based on SCr of 2.62 mg/dL (H)).  Liver Function Tests: Recent Labs  Lab  10/21/20 0700 10/23/20 0357 10/24/20 0940  AST 13*  --   --   ALT 10  --   --   ALKPHOS 54  --   --   BILITOT 0.9  --   --   PROT 7.3  --   --   ALBUMIN 4.1 3.3* 3.5    CBG: No results for input(s): GLUCAP in the last 168 hours.  Recent Results (from the past 240 hour(s))  Respiratory Panel by RT PCR (Flu A&B, Covid) - Nasopharyngeal Swab     Status: None   Collection Time: 10/20/20  9:49 PM   Specimen: Nasopharyngeal Swab  Result Value Ref Range Status   SARS Coronavirus 2 by RT PCR NEGATIVE NEGATIVE Final    Comment: (NOTE) SARS-CoV-2 target nucleic acids are NOT DETECTED.  The SARS-CoV-2 RNA is generally detectable in upper respiratoy specimens during  the acute phase of infection. The lowest concentration of SARS-CoV-2 viral copies this assay can detect is 131 copies/mL. A negative result does not preclude SARS-Cov-2 infection and should not be used as the sole basis for treatment or other patient management decisions. A negative result may occur with  improper specimen collection/handling, submission of specimen other than nasopharyngeal swab, presence of viral mutation(s) within the areas targeted by this assay, and inadequate number of viral copies (<131 copies/mL). A negative result must be combined with clinical observations, patient history, and epidemiological information. The expected result is Negative.  Fact Sheet for Patients:  PinkCheek.be  Fact Sheet for Healthcare Providers:  GravelBags.it  This test is no t yet approved or cleared by the [place] FDA and  has been authorized for detection and/or diagnosis of SARS-CoV-2 by FDA under an Emergency Use Authorization (EUA). This EUA will remain  in effect (meaning this test can be used) for the duration of the COVID-19 declaration under Section 564(b)(1) of the Act, 21 U.S.C. section 360bbb-3(b)(1), unless the authorization is terminated  or revoked sooner.     Influenza A by PCR NEGATIVE NEGATIVE Final   Influenza B by PCR NEGATIVE NEGATIVE Final    Comment: (NOTE) The Xpert Xpress SARS-CoV-2/FLU/RSV assay is intended as an aid in  the diagnosis of influenza from Nasopharyngeal swab specimens and  should not be used as a sole basis for treatment. Nasal washings and  aspirates are unacceptable for Xpert Xpress SARS-CoV-2/FLU/RSV  testing.  Fact Sheet for Patients: PinkCheek.be  Fact Sheet for Healthcare Providers: GravelBags.it  This test is not yet approved or cleared by the [place] FDA and  has been authorized for detection and/or diagnosis of SARS-CoV-2 by  FDA under an Emergency Use Authorization (EUA). This EUA will remain  in effect (meaning this test can be used) for the duration of the  Covid-19 declaration under Section 564(b)(1) of the Act, 21  U.S.C. section 360bbb-3(b)(1), unless the authorization is  terminated or revoked. Performed at [facility], [address] [place], [place] [address]      Radiology Studies: No results found. Scheduled Meds: . ferrous sulfate  325 mg Oral Q breakfast  . flecainide  75 mg Oral Q12H  .  HYDROmorphone (DILAUDID) injection  0.5 mg Intravenous Once  . lubiprostone  24 mcg Oral BID WC  . mometasone-formoterol  2 puff Inhalation BID  . pantoprazole  40 mg Oral Daily  . simvastatin  40 mg Oral Q1500   Continuous Infusions: . sodium chloride 60 mL/hr at 10/23/20 1235     LOS: 3 days   Time spent: 28 minutes   Darliss Cheney, MD How to contact the [facility] Attending or Consulting provider [place] or covering provider during after hours [place], for this patient?  1. Check the care team in [facility] and look for a) attending/consulting [facility] provider listed and b) the [facility] team listed 2. Log into [email or web address] and use [place]'s universal password to access. If you do not have the password, please contact the  hospital operator. 3. Locate the [facility] provider you are looking for under [facility] and page to a number that you can be directly reached. 4. If you still have difficulty reaching the provider, please page the [facility] (Director on Call) for the Hospitalists listed on amion for assistance.  10/24/2020, 11:52 AM

## 2020-10-25 ENCOUNTER — Ambulatory Visit: Payer: Medicare HMO

## 2020-10-25 ENCOUNTER — Other Ambulatory Visit: Payer: Self-pay | Admitting: Student

## 2020-10-25 DIAGNOSIS — R001 Bradycardia, unspecified: Secondary | ICD-10-CM | POA: Diagnosis not present

## 2020-10-25 DIAGNOSIS — N179 Acute kidney failure, unspecified: Secondary | ICD-10-CM

## 2020-10-25 DIAGNOSIS — I480 Paroxysmal atrial fibrillation: Secondary | ICD-10-CM

## 2020-10-25 LAB — BASIC METABOLIC PANEL
Anion gap: 9 (ref 5–15)
BUN: 17 mg/dL (ref 8–23)
CO2: 24 mmol/L (ref 22–32)
Calcium: 8.9 mg/dL (ref 8.9–10.3)
Chloride: 105 mmol/L (ref 98–111)
Creatinine, Ser: 2.3 mg/dL — ABNORMAL HIGH (ref 0.44–1.00)
GFR, Estimated: 22 mL/min — ABNORMAL LOW (ref >60)
Glucose, Bld: 92 mg/dL (ref 70–99)
Potassium: 4.1 mmol/L (ref 3.5–5.1)
Sodium: 138 mmol/L (ref 135–145)

## 2020-10-25 LAB — MAGNESIUM: Magnesium: 1.9 mg/dL (ref 1.7–2.4)

## 2020-10-25 MED ORDER — LOSARTAN POTASSIUM 25 MG PO TABS
12.5000 mg | ORAL_TABLET | Freq: Every day | ORAL | Status: DC
Start: 2020-10-27 — End: 2021-03-30

## 2020-10-25 MED ORDER — HYDRALAZINE HCL 50 MG PO TABS: 50.0000 mg | ORAL_TABLET | Freq: Three times a day (TID) | ORAL | 0 refills | Status: DC

## 2020-10-25 MED ORDER — APIXABAN 5 MG PO TABS
5.0000 mg | ORAL_TABLET | Freq: Two times a day (BID) | ORAL | Status: DC
  Administered 2020-10-25: 5 mg via ORAL
  Filled 2020-10-25: qty 1

## 2020-10-25 MED ORDER — HYDRALAZINE HCL 50 MG PO TABS
50.0000 mg | ORAL_TABLET | Freq: Four times a day (QID) | ORAL | Status: DC
  Administered 2020-10-25: 50 mg via ORAL
  Filled 2020-10-25: qty 1

## 2020-10-25 MED ORDER — TRIAMTERENE-HCTZ 75-50 MG PO TABS
1.0000 | ORAL_TABLET | Freq: Every day | ORAL | Status: DC
Start: 2020-10-27 — End: 2020-11-23

## 2020-10-25 MED ORDER — FUROSEMIDE 40 MG PO TABS
40.0000 mg | ORAL_TABLET | Freq: Every day | ORAL | 3 refills | Status: DC
Start: 2020-10-27 — End: 2021-03-27

## 2020-10-25 MED ORDER — FLECAINIDE ACETATE 50 MG PO TABS
50.0000 mg | ORAL_TABLET | Freq: Two times a day (BID) | ORAL | 0 refills | Status: DC
Start: 2020-10-25 — End: 2020-11-29

## 2020-10-25 MED ORDER — CLONIDINE HCL 0.1 MG PO TABS: 0.1000 mg | ORAL_TABLET | Freq: Two times a day (BID) | ORAL | Status: DC

## 2020-10-25 NOTE — Progress Notes (Signed)
Patient ambulated several feet in hall independently with steady gait. Complained of shortness of breath, no lightheadedness or dizziness. No weakness per patient. BP 181/66 prior to ambulating, 201/74 after ambulation. 195/71 after 5 minutes resting. Will notify cardiology physician.

## 2020-10-25 NOTE — Discharge Summary (Signed)
Physician Discharge Summary  [name] [name] TJQ:[id number] DOB: 03/05/1946 DOA: 10/20/2020  PCP: Andres Shad, MD  Admit date: 10/20/2020 Discharge date: 10/25/2020  Admitted From: Home Disposition: Home  Recommendations for Outpatient Follow-up:  1. Follow up with PCP in 1-2 weeks 2. Follow-up with cardiology per their recommendations 3. Please obtain BMP/CBC in one week 4. Please follow up with your PCP on the following pending results: Unresulted Labs (From admission, onward)          Start     Ordered   10/26/20 7622  Basic metabolic panel  Daily,   R     Question:  Specimen collection method  Answer:  Lab=Lab collect   10/25/20 0742   10/22/20 0500  Magnesium  Daily,   R      10/21/20 0706           Home Health: None Equipment/Devices: None  Discharge Condition: Stable CODE STATUS: Full code Diet recommendation: Cardiac/low-sodium  Subjective: Seen and examined.  Remains symptom-free except intermittent complaint of being sick of stomach which is also brief with hydralazine even when that is given in the IV form.  The symptoms resolves on its own after 1 to 2 hours after taking hydralazine.  Brief/Interim Summary: 74 y.o.femalewith medical history significant ofGERD, gout, hyperlipidemia, hypertension, history of hypokalemia, PUD, GERD, constipation, hemorrhoid, OSA awaiting for CPAP presented to [facility] due to progressively worse weakness, associated with postural dizziness for the past 10 days after her flecainide was increased to 100 mg p.o. twice daily a few days before her symptoms started. She denied chest pain, palpitations, diaphoresis, PND, orthopnea or recent pitting edema of the lower extremities. No fever, rhinorrhea, sore throat, wheezing or hemoptysis.   ED Course:Initial vital signs were temperature97.7 F, pulse 35, respirations 17, BP 165/51 mmHg and O2 sat 100% on room air. While in the ER, she received atropine 0.5 mg IVP x2.  She was  subsequently admitted to hospital service for symptomatic bradycardia with the symptoms of dizziness as well as tachybradycardia syndrome and AKI on CKD stage IIIa.Marland Kitchen  She was seen by cardiology at [facility] and was subsequently transferred to [facility] in order to be seen by EP cardiology for possible need of PPM.  EP cardiology here initially discontinued her clonidine, diltiazem as well as flecainide.  They wanted to observe her for 48 hours before making a final decision.  During those 48 hours, patient did not have any episode of bradycardia.  She did have few episodes of nonsustained SVT.  She also had uncontrolled hypertension which was controlled with as needed hydralazine.  Subsequently hydralazine was started scheduled p.o.  She was seen by EP cardiology once again yesterday and they decided to give her low-dose flecainide challenge so she was started on 50 mg twice daily instead of 100 mg twice daily that she was taking prior to hospitalization.  She did fairly well.  She was seen by cardiology again today and they cleared her for discharge.  They recommend discharging on flecainide 50 mg p.o. twice daily along with Eliquis but hold clonidine, diltiazem.  For her blood pressure, she is being discharged on hydralazine 50 mg p.o. 3 times daily.  Other than this, she also came in with AKI on CKD stage IIIa.  She was hydrated slowly and her renal function has improved and is very close to her baseline.  She is diuresing well.  Per chart review, her last known creatinine in September this year was  2.6 and currently it is 2.3 however before September this year, her creatinine in June this year was 1.43.  It is hard to tell what is her baseline.  But clearly she is in the right direction and has been improving.  Her presenting creatinine was 4.3 which is improved to 2.3 today.  She has been advised to drink at least 2 L of water over the next 24 hours and hold her nephrotoxic agents which include  Maxide and losartan for 2 days.  She is being discharged in stable condition.  Discharge Diagnoses:  Principal Problem:   Symptomatic bradycardia Active Problems:   Essential hypertension, benign   GERD (gastroesophageal reflux disease)   Hyperlipidemia   Gout   Paroxysmal atrial fibrillation (HCC)   OSA (obstructive sleep apnea)   AKI (acute kidney injury) ([place])   Microcytic anemia    Discharge Instructions   Allergies as of 10/25/2020      Reactions   Acetaminophen-codeine Hives   Allopurinol Hives   Amlodipine    Felt she retained fluid in her chest    Amoxicillin Hives   Did it involve swelling of the face/tongue/throat, SOB, or low BP? No Did it involve sudden or severe rash/hives, skin peeling, or any reaction on the inside of your mouth or nose? No Did you need to seek medical attention at a hospital or doctor's office? No When did it last happen?10 + years If all above answers are "NO", may proceed with cephalosporin use.   Tramadol Hives      Medication List    STOP taking these medications   cloNIDine 0.1 MG tablet Commonly known as: CATAPRES   diltiazem 30 MG tablet Commonly known as: CARDIZEM   hydrocortisone 2.5 % rectal cream Commonly known as: Proctosol HC     TAKE these medications   acetaminophen 500 MG tablet Commonly known as: TYLENOL Take 1,000 mg by mouth every 6 (six) hours as needed for moderate pain.   albuterol 108 (90 Base) MCG/ACT inhaler Commonly known as: VENTOLIN HFA Inhale 1-2 puffs into the lungs every 6 (six) hours as needed for wheezing or shortness of breath.   apixaban 5 MG Tabs tablet Commonly known as: ELIQUIS Take 1 tablet (5 mg total) by mouth 2 (two) times daily.   APPLE CIDER VINEGAR PO Take 2 capsules by mouth daily.   cetirizine 10 MG tablet Commonly known as: ZYRTEC Take 10 mg by mouth daily as needed for allergies.   colchicine 0.6 MG tablet Take 0.6 mg by mouth daily.   cyclobenzaprine 5 MG  tablet Commonly known as: FLEXERIL Take 5 mg by mouth 3 (three) times daily as needed for muscle spasms.   cyproheptadine 4 MG tablet Commonly known as: PERIACTIN Take 4 mg by mouth 3 (three) times daily as needed for allergies.   diclofenac Sodium 1 % Gel Commonly known as: VOLTAREN Apply 1 application topically 4 (four) times daily as needed (pain).   ELDERBERRY PO Take 2 capsules by mouth daily.   ferrous sulfate 325 (65 FE) MG tablet Take 325 mg by mouth daily with breakfast.   flecainide 50 MG tablet Commonly known as: TAMBOCOR Take 1 tablet (50 mg total) by mouth 2 (two) times daily. What changed:   medication strength  how much to take   fluticasone 50 MCG/ACT nasal spray Commonly known as: FLONASE Place 1 spray into both nostrils daily as needed for allergies or rhinitis.   Fluticasone-Salmeterol 500-50 MCG/DOSE Aepb Commonly known as: ADVAIR Inhale  1 puff into the lungs every 12 (twelve) hours.   furosemide 40 MG tablet Commonly known as: LASIX Take 1 tablet (40 mg total) by mouth daily. Start taking on: October 27, 2020 What changed: These instructions start on October 27, 2020. If you are unsure what to do until then, ask your doctor or other care provider.   hydrALAZINE 50 MG tablet Commonly known as: APRESOLINE Take 1 tablet (50 mg total) by mouth 3 (three) times daily.   losartan 25 MG tablet Commonly known as: COZAAR Take 0.5 tablets (12.5 mg total) by mouth daily. Start taking on: October 27, 2020 What changed: These instructions start on October 27, 2020. If you are unsure what to do until then, ask your doctor or other care provider.   lubiprostone 24 MCG capsule Commonly known as: Amitiza Take 1 capsule (24 mcg total) by mouth in the morning and at bedtime.   meclizine 25 MG tablet Commonly known as: ANTIVERT Take 25 mg by mouth 3 (three) times daily as needed for dizziness.   omeprazole 40 MG capsule Commonly known as: PRILOSEC Take 40  mg by mouth daily.   potassium chloride SA 20 MEQ tablet Commonly known as: KLOR-CON Take 20 mEq by mouth in the morning, at noon, and at bedtime.   simvastatin 40 MG tablet Commonly known as: ZOCOR Take 40 mg by mouth daily in the afternoon.   triamterene-hydrochlorothiazide 75-50 MG tablet Commonly known as: MAXZIDE Take 1 tablet by mouth daily. Start taking on: October 27, 2020 What changed: These instructions start on October 27, 2020. If you are unsure what to do until then, ask your doctor or other care provider.   vitamin C 250 MG tablet Commonly known as: ASCORBIC ACID Take 500 mg by mouth daily.   Vitamin D-3 25 MCG (1000 UT) Caps Take 1 capsule by mouth daily.       Follow-up Information    Evans Lance, MD Follow up on 11/25/2020.   Specialty: Cardiology Why: at 1015 for post hospital follow up. Contact information: [place] [address] [phone]        [facility] Follow up on 11/01/2020.   Specialty: Cardiology Why: at 1100 am for EKG and BMET Contact information: [place] [phone]       Andres Shad, MD Follow up in 1 week(s).   Specialty: Family Medicine Contact information: [address] [place] [address] [phone]        Satira Sark, MD .   Specialty: Cardiology Contact information: [address] [place] [address] [phone]              Allergies  Allergen Reactions  . Acetaminophen-Codeine Hives  . Allopurinol Hives  . Amlodipine     Felt she retained fluid in her chest   . Amoxicillin Hives    Did it involve swelling of the face/tongue/throat, SOB, or low BP? No Did it involve sudden or severe rash/hives, skin peeling, or any reaction on the inside of your mouth or nose? No Did you need to seek medical attention at a hospital or doctor's office? No When did it last happen?10 + years If all above answers are  "NO", may proceed with cephalosporin use.   . Tramadol Hives    Consultations: Cardiology   Procedures/Studies: DG Chest Port 1 View  Result Date: 10/21/2020 CLINICAL DATA:  Dyspnea. EXAM: PORTABLE CHEST 1 VIEW COMPARISON:  October 20, 2020 FINDINGS: There is no evidence of acute infiltrate, pleural effusion or pneumothorax. The heart size and mediastinal contours are within normal limits. There is moderate severity calcification of the aortic arch. The visualized skeletal structures are unremarkable. IMPRESSION: No active disease. Electronically Signed   By: Virgina Norfolk M.D.   On: 10/21/2020 20:10   DG Chest Port 1 View  Result Date: 10/20/2020 CLINICAL DATA:  Shortness of breath EXAM: PORTABLE CHEST 1 VIEW COMPARISON:  09/11/2020 FINDINGS: The heart size and mediastinal contours are within normal limits. Both lungs are clear. The visualized skeletal structures are unremarkable. IMPRESSION: No active disease. Electronically Signed   By: Ulyses Jarred M.D.   On: 10/20/2020 20:35      Discharge Exam: Vitals:   10/25/20 1222 10/25/20 1356  BP: (!) 144/53 (!) 167/77  Pulse: 86   Resp: 17 18  Temp: 98.2 F (36.8 C)   SpO2: 100%    Vitals:   10/25/20 0805 10/25/20 1042 10/25/20 1222 10/25/20 1356  BP: (!) 201/74 (!) 188/76 (!) 144/53 (!) 167/77  Pulse:  73 86   Resp:  18 17 18   Temp:  98.2 F (36.8 C) 98.2 F (36.8 C)   TempSrc:  Oral Oral   SpO2:   100%   Weight:      Height:        General: Pt is alert, awake, not in acute distress Cardiovascular: RRR, S1/S2 +, no rubs, no gallops Respiratory: CTA bilaterally, no wheezing, no rhonchi Abdominal: Soft, NT, ND, bowel sounds + Extremities: no edema, no cyanosis    The results of significant diagnostics from this hospitalization (including imaging, microbiology, ancillary and laboratory) are listed below for reference.     Microbiology: Recent Results (from the past 240 hour(s))  Respiratory Panel by RT PCR  (Flu A&B, Covid) - Nasopharyngeal Swab     Status: None   Collection Time: 10/20/20  9:49 PM   Specimen: Nasopharyngeal Swab  Result Value Ref Range Status   SARS Coronavirus 2 by RT PCR NEGATIVE NEGATIVE Final    Comment: (NOTE) SARS-CoV-2 target nucleic acids are NOT DETECTED.  The SARS-CoV-2 RNA is generally detectable in upper respiratoy specimens during the acute phase of infection. The lowest concentration of SARS-CoV-2 viral copies this assay can detect is 131 copies/mL. A negative result does not preclude SARS-Cov-2 infection and should not be used as the sole basis for treatment or other patient management decisions. A negative result may occur with  improper specimen collection/handling, submission of specimen other than nasopharyngeal swab, presence of viral mutation(s) within the areas targeted by this assay, and inadequate number of viral copies (<131 copies/mL). A negative result must be combined with clinical observations, patient history, and epidemiological information. The expected result is Negative.  Fact Sheet for Patients:  PinkCheek.be  Fact Sheet for Healthcare Providers:  GravelBags.it  This test is no t yet approved or cleared by the [place] FDA and  has been authorized for detection and/or diagnosis of SARS-CoV-2 by FDA under an Emergency Use Authorization (EUA). This EUA will remain  in effect (meaning this test can be used) for the duration of the COVID-19 declaration under Section 564(b)(1) of the Act, 21 U.S.C. section 360bbb-3(b)(1), unless the authorization is terminated or revoked sooner.     Influenza A by PCR NEGATIVE NEGATIVE Final   Influenza B by PCR NEGATIVE NEGATIVE Final    Comment: (NOTE) The Xpert Xpress SARS-CoV-2/FLU/RSV assay is intended as an aid in  the diagnosis  of influenza from Nasopharyngeal swab specimens and  should not be used as a sole basis for treatment.  Nasal washings and  aspirates are unacceptable for Xpert Xpress SARS-CoV-2/FLU/RSV  testing.  Fact Sheet for Patients: PinkCheek.be  Fact Sheet for Healthcare Providers: GravelBags.it  This test is not yet approved or cleared by the [place] FDA and  has been authorized for detection and/or diagnosis of SARS-CoV-2 by  FDA under an Emergency Use Authorization (EUA). This EUA will remain  in effect (meaning this test can be used) for the duration of the  Covid-19 declaration under Section 564(b)(1) of the Act, 21  U.S.C. section 360bbb-3(b)(1), unless the authorization is  terminated or revoked. Performed at [facility], [address] [place], [place] [address]      Labs: BNP (last 3 results) Recent Labs    06/10/20 0208 10/20/20 1853  BNP 198.0* 409.8*   Basic Metabolic Panel: Recent Labs  Lab 10/21/20 0700 10/22/20 1506 10/23/20 0357 10/24/20 0940 10/25/20 0158  NA 135 138 138 141 138  K 3.9 3.1* 2.9* 3.2* 4.1  CL 94* 100 102 101 105  CO2 26 24 24 26 24   GLUCOSE 91 109* 122* 111* 92  BUN 56* 38* 31* 23 17  CREATININE 3.98* 3.00* 2.71* 2.62* 2.30*  CALCIUM 9.6 9.5 9.1 9.2 8.9  MG  --  2.3 2.1 2.0 1.9  PHOS  --   --  2.9 2.3*  --    Liver Function Tests: Recent Labs  Lab 10/21/20 0700 10/23/20 0357 10/24/20 0940  AST 13*  --   --   ALT 10  --   --   ALKPHOS 54  --   --   BILITOT 0.9  --   --   PROT 7.3  --   --   ALBUMIN 4.1 3.3* 3.5   No results for input(s): LIPASE, AMYLASE in the last 168 hours. No results for input(s): AMMONIA in the last 168 hours. CBC: Recent Labs  Lab 10/20/20 1853 10/21/20 0753 10/22/20 1506 10/24/20 1208  WBC 7.6 5.9 8.5 9.5  NEUTROABS  --   --  5.6 6.4  HGB 9.1* 9.6* 9.1* 8.9*  HCT 29.9* 31.9* 29.8* 29.8*  MCV 86.7 89.4 88.4 89.8  PLT 424* 319 391 374   Cardiac Enzymes: No results for input(s): CKTOTAL, CKMB, CKMBINDEX, TROPONINI in the last 168  hours. BNP: Invalid input(s): POCBNP CBG: No results for input(s): GLUCAP in the last 168 hours. D-Dimer No results for input(s): DDIMER in the last 72 hours. Hgb A1c No results for input(s): HGBA1C in the last 72 hours. Lipid Profile No results for input(s): CHOL, HDL, LDLCALC, TRIG, CHOLHDL, LDLDIRECT in the last 72 hours. Thyroid function studies No results for input(s): TSH, T4TOTAL, T3FREE, THYROIDAB in the last 72 hours.  Invalid input(s): FREET3 Anemia work up No results for input(s): VITAMINB12, FOLATE, FERRITIN, TIBC, IRON, RETICCTPCT in the last 72 hours. Urinalysis No results found for: COLORURINE, APPEARANCEUR, [place], [place], [place], [place], [place], [place], PROTEINUR, UROBILINOGEN, NITRITE, LEUKOCYTESUR Sepsis Labs Invalid input(s): PROCALCITONIN,  WBC,  LACTICIDVEN Microbiology Recent Results (from the past 240 hour(s))  Respiratory Panel by RT PCR (Flu A&B, Covid) - Nasopharyngeal Swab     Status: None   Collection Time: 10/20/20  9:49 PM   Specimen: Nasopharyngeal Swab  Result Value Ref Range Status   SARS Coronavirus 2 by RT PCR NEGATIVE NEGATIVE Final    Comment: (NOTE) SARS-CoV-2 target nucleic acids are NOT DETECTED.  The SARS-CoV-2 RNA  is generally detectable in upper respiratoy specimens during the acute phase of infection. The lowest concentration of SARS-CoV-2 viral copies this assay can detect is 131 copies/mL. A negative result does not preclude SARS-Cov-2 infection and should not be used as the sole basis for treatment or other patient management decisions. A negative result may occur with  improper specimen collection/handling, submission of specimen other than nasopharyngeal swab, presence of viral mutation(s) within the areas targeted by this assay, and inadequate number of viral copies (<131 copies/mL). A negative result must be combined with clinical observations, patient history, and epidemiological information. The expected result  is Negative.  Fact Sheet for Patients:  PinkCheek.be  Fact Sheet for Healthcare Providers:  GravelBags.it  This test is no t yet approved or cleared by the [place] FDA and  has been authorized for detection and/or diagnosis of SARS-CoV-2 by FDA under an Emergency Use Authorization (EUA). This EUA will remain  in effect (meaning this test can be used) for the duration of the COVID-19 declaration under Section 564(b)(1) of the Act, 21 U.S.C. section 360bbb-3(b)(1), unless the authorization is terminated or revoked sooner.     Influenza A by PCR NEGATIVE NEGATIVE Final   Influenza B by PCR NEGATIVE NEGATIVE Final    Comment: (NOTE) The Xpert Xpress SARS-CoV-2/FLU/RSV assay is intended as an aid in  the diagnosis of influenza from Nasopharyngeal swab specimens and  should not be used as a sole basis for treatment. Nasal washings and  aspirates are unacceptable for Xpert Xpress SARS-CoV-2/FLU/RSV  testing.  Fact Sheet for Patients: PinkCheek.be  Fact Sheet for Healthcare Providers: GravelBags.it  This test is not yet approved or cleared by the [place] FDA and  has been authorized for detection and/or diagnosis of SARS-CoV-2 by  FDA under an Emergency Use Authorization (EUA). This EUA will remain  in effect (meaning this test can be used) for the duration of the  Covid-19 declaration under Section 564(b)(1) of the Act, 21  U.S.C. section 360bbb-3(b)(1), unless the authorization is  terminated or revoked. Performed at [facility], [address], [place], [place] [address]      Time coordinating discharge: Over 30 minutes  SIGNED:   Darliss Cheney, MD  [facility] 10/25/2020, 2:09 PM  If 7PM-7AM, please contact night-coverage [email or web address]

## 2020-10-25 NOTE — Progress Notes (Addendum)
Electrophysiology Rounding Note  Patient Name: [name] [name] Date of Encounter: 10/25/2020  Primary Cardiologist: Rozann Lesches, MD Electrophysiologist: Dr. Lovena Le   Subjective   The patient is doing well today. Remains in NSR/Sinus brady on low dose flecainide. At this time, the patient denies chest pain, shortness of breath, or any new concerns.  Inpatient Medications    Scheduled Meds: . apixaban  5 mg Oral BID  . ferrous sulfate  325 mg Oral Q breakfast  . flecainide  50 mg Oral Q12H  .  HYDROmorphone (DILAUDID) injection  0.5 mg Intravenous Once  . lubiprostone  24 mcg Oral BID WC  . mometasone-formoterol  2 puff Inhalation BID  . pantoprazole  40 mg Oral Daily  . simvastatin  40 mg Oral Q1500   Continuous Infusions:   PRN Meds: acetaminophen **OR** acetaminophen, alum & mag hydroxide-simeth, cyproheptadine, fluticasone, hydrALAZINE, hydrocortisone, prochlorperazine   Vital Signs    Vitals:   10/24/20 1714 10/24/20 1928 10/24/20 2026 10/25/20 0441  BP: (!) 146/49  (!) 152/60 (!) 152/51  Pulse: 85   75  Resp: 18  16 19   Temp: 99 F (37.2 C)  98.3 F (36.8 C) 98.4 F (36.9 C)  TempSrc: Oral   Oral  SpO2: 100% 99%  99%  Weight:    75.7 kg  Height:        Intake/Output Summary (Last 24 hours) at 10/25/2020 0743 Last data filed at 10/24/2020 2359 Gross per 24 hour  Intake 1455.35 ml  Output --  Net 1455.35 ml   Filed Weights   10/22/20 0545 10/23/20 0544 10/25/20 0441  Weight: 73.5 kg 74.8 kg 75.7 kg    Physical Exam    GEN- The patient is well appearing, alert and oriented x 3 today.   Head- normocephalic, atraumatic Eyes-  Sclera clear, conjunctiva pink Ears- hearing intact Oropharynx- clear Neck- supple Lungs- Clear to ausculation bilaterally, normal work of breathing Heart- Regular rate and rhythm, no murmurs, rubs or gallops GI- soft, NT, ND, + BS Extremities- no clubbing or cyanosis. No edema Skin- no rash or lesion Psych- euthymic  mood, full affect Neuro- strength and sensation are intact  Labs    CBC Recent Labs    10/22/20 1506 10/24/20 1208  WBC 8.5 9.5  NEUTROABS 5.6 6.4  HGB 9.1* 8.9*  HCT 29.8* 29.8*  MCV 88.4 89.8  PLT 391 737   Basic Metabolic Panel Recent Labs    10/23/20 0357 10/23/20 0357 10/24/20 0940 10/25/20 0158  NA 138   < > 141 138  K 2.9*   < > 3.2* 4.1  CL 102   < > 101 105  CO2 24   < > 26 24  GLUCOSE 122*   < > 111* 92  BUN 31*   < > 23 17  CREATININE 2.71*   < > 2.62* 2.30*  CALCIUM 9.1   < > 9.2 8.9  MG 2.1   < > 2.0 1.9  PHOS 2.9  --  2.3*  --    < > = values in this interval not displayed.   Liver Function Tests Recent Labs    10/23/20 0357 10/24/20 0940  ALBUMIN 3.3* 3.5   No results for input(s): LIPASE, AMYLASE in the last 72 hours. Cardiac Enzymes No results for input(s): CKTOTAL, CKMB, CKMBINDEX, TROPONINI in the last 72 hours.   Telemetry    SB/NSR 50-60s (personally reviewed)  Radiology    No results found.  Patient Profile  [name] [name] is a 74 y.o. female with a hx of paroxysmal atrial fibrillation, HTN, HLD, OSA, CKD who is being seen today for the evaluation of bradycardia at the request of Carlyle Dolly and Dr Doristine Bosworth.  Assessment & Plan    1. Bradycardia This has improved off flecainide 100 BID, as well as with resolution of her AKI. She was not taking diltiazem regularly.  We will retry on flecainide at lower dose, as this was likely exacerbated by her AKI.  ETT 07/2020 showed no significant EKG changes while on flecainide.   2. Paroxysmal atrial fibrillation Her symptoms had essentially resolved on flecainide, so we are re-challenging at a lower dose. now off.  Will discuss disposition with Dr. Lovena Le Resume Eliquis 5 mg BID   3. HTN Remains somewhat elevated in 140-150 range. Will avoid AV nodal blockade with recent bradycardia.  She remains off losartan and clonidine   4. AKI Improving. Cr 2.3 this am.    5.  Hypokalemia K 4.1 this am.   For questions or updates, please contact [place] Please consult [email or web address] for contact info under Cardiology/STEMI.  Signed, Yetzali Friar, PA-C  10/25/2020, 7:43 AM   EPAttending  Patient seen and examined. Agree with the problems as noted above. The patient is doing well and appears to be tolerating re-initiation of low dose flecainide. She is stable for DC. She will hold off on her losartan and clonidine. If she develops rapid atrial fib, we would consider PPM insertion.   Carleene Overlie Andreka Stucki,MD

## 2020-10-25 NOTE — Progress Notes (Signed)
   10/25/20 0805  Assess: MEWS Score  BP (!) 201/74  ECG Heart Rate 87  Assess: MEWS Score  MEWS Temp 0  MEWS Systolic 2  MEWS Pulse 0  MEWS RR 0  MEWS LOC 0  MEWS Score 2  MEWS Score Color Yellow  Assess: if the MEWS score is Yellow or Red  Were vital signs taken at a resting state? Yes  Focused Assessment No change from prior assessment  Early Detection of Sepsis Score *See Row Information* Low  MEWS guidelines implemented *See Row Information* Yes  Treat  MEWS Interventions Escalated (See documentation below);Administered scheduled meds/treatments  Take Vital Signs  Increase Vital Sign Frequency  Yellow: Q 2hr X 2 then Q 4hr X 2, if remains yellow, continue Q 4hrs  Escalate  MEWS: Escalate Yellow: discuss with charge nurse/RN and consider discussing with provider and RRT  Notify: Charge Nurse/RN  Name of Charge Nurse/RN Notified Clint Lipps RN  Date Charge Nurse/RN Notified 10/25/20  Time Charge Nurse/RN Notified [id number]  Notify: Provider  Provider Name/Title Darliss Cheney  Date Provider Notified 10/25/20  Time Provider Notified [phone]  Notification Type Face-to-face  Notification Reason Change in status  Response See new orders  Date of Provider Response 10/25/20  Time of Provider Response [phone]  Document  Patient Outcome Stabilized after interventions  Progress note created (see row info) Yes

## 2020-10-25 NOTE — Discharge Instructions (Signed)

## 2020-10-28 ENCOUNTER — Other Ambulatory Visit: Payer: Self-pay | Admitting: *Deleted

## 2020-10-28 DIAGNOSIS — N179 Acute kidney failure, unspecified: Secondary | ICD-10-CM

## 2020-10-31 ENCOUNTER — Encounter (HOSPITAL_COMMUNITY): Admission: RE | Admit: 2020-10-31 | Payer: Medicare HMO | Source: Ambulatory Visit

## 2020-11-01 ENCOUNTER — Other Ambulatory Visit: Payer: Self-pay

## 2020-11-01 ENCOUNTER — Other Ambulatory Visit (INDEPENDENT_AMBULATORY_CARE_PROVIDER_SITE_OTHER): Payer: Medicare HMO

## 2020-11-01 ENCOUNTER — Other Ambulatory Visit (HOSPITAL_COMMUNITY)
Admission: RE | Admit: 2020-11-01 | Discharge: 2020-11-01 | Disposition: A | Discharge status: Home / Self Care | Payer: Medicare HMO | Source: Ambulatory Visit | Attending: Student | Admitting: Student

## 2020-11-01 ENCOUNTER — Other Ambulatory Visit: Payer: Self-pay | Admitting: *Deleted

## 2020-11-01 VITALS — BP 150/58 | HR 73

## 2020-11-01 DIAGNOSIS — I480 Paroxysmal atrial fibrillation: Secondary | ICD-10-CM | POA: Diagnosis not present

## 2020-11-01 DIAGNOSIS — N179 Acute kidney failure, unspecified: Secondary | ICD-10-CM | POA: Diagnosis present

## 2020-11-01 LAB — BASIC METABOLIC PANEL
Anion gap: 8 (ref 5–15)
BUN: 28 mg/dL — ABNORMAL HIGH (ref 8–23)
CO2: 30 mmol/L (ref 22–32)
Calcium: 8.8 mg/dL — ABNORMAL LOW (ref 8.9–10.3)
Chloride: 96 mmol/L — ABNORMAL LOW (ref 98–111)
Creatinine, Ser: 2.31 mg/dL — ABNORMAL HIGH (ref 0.44–1.00)
GFR, Estimated: 22 mL/min — ABNORMAL LOW (ref >60)
Glucose, Bld: 101 mg/dL — ABNORMAL HIGH (ref 70–99)
Potassium: 3.9 mmol/L (ref 3.5–5.1)
Sodium: 134 mmol/L — ABNORMAL LOW (ref 135–145)

## 2020-11-01 NOTE — Patient Instructions (Signed)
Your EKG today is normal sinus rhythm    Your lab work is processing.   Your EKG will be sent to Dr. Lovena Le.   We will call you with any new instructions.   Thank you for choosing [facility] !

## 2020-11-03 ENCOUNTER — Ambulatory Visit: Payer: Self-pay | Admitting: Adult Health

## 2020-11-11 ENCOUNTER — Telehealth: Payer: Self-pay | Admitting: Cardiology

## 2020-11-11 NOTE — Telephone Encounter (Signed)
I spoke with patient. She does not have diarrhea, she has gout and was requesting medication for it. She reports her pcp is unavailable so I suggested she go to Urgent card to be seen tonight.She agrees with plan.

## 2020-11-11 NOTE — Telephone Encounter (Signed)
Patient called stating that she is having severe diarrhea. She is wanting to know what she can take that will not interfere with her heart medications.

## 2020-11-18 ENCOUNTER — Emergency Department (HOSPITAL_COMMUNITY): Payer: Medicare HMO

## 2020-11-18 ENCOUNTER — Emergency Department (HOSPITAL_COMMUNITY)
Admission: EM | Admit: 2020-11-18 | Discharge: 2020-11-18 | Disposition: A | Discharge status: Home / Self Care | Payer: Medicare HMO | Attending: Emergency Medicine | Admitting: Emergency Medicine

## 2020-11-18 ENCOUNTER — Other Ambulatory Visit: Payer: Self-pay

## 2020-11-18 ENCOUNTER — Encounter (HOSPITAL_COMMUNITY): Payer: Self-pay

## 2020-11-18 DIAGNOSIS — E876 Hypokalemia: Secondary | ICD-10-CM | POA: Diagnosis not present

## 2020-11-18 DIAGNOSIS — Z79899 Other long term (current) drug therapy: Secondary | ICD-10-CM | POA: Insufficient documentation

## 2020-11-18 DIAGNOSIS — I10 Essential (primary) hypertension: Secondary | ICD-10-CM | POA: Diagnosis not present

## 2020-11-18 DIAGNOSIS — L03031 Cellulitis of right toe: Secondary | ICD-10-CM | POA: Insufficient documentation

## 2020-11-18 DIAGNOSIS — Z7901 Long term (current) use of anticoagulants: Secondary | ICD-10-CM | POA: Insufficient documentation

## 2020-11-18 DIAGNOSIS — I4891 Unspecified atrial fibrillation: Secondary | ICD-10-CM | POA: Insufficient documentation

## 2020-11-18 DIAGNOSIS — Z87891 Personal history of nicotine dependence: Secondary | ICD-10-CM | POA: Insufficient documentation

## 2020-11-18 DIAGNOSIS — M79674 Pain in right toe(s): Secondary | ICD-10-CM | POA: Diagnosis present

## 2020-11-18 LAB — CBC WITH DIFFERENTIAL/PLATELET
Basophils Absolute: 0 10*3/uL (ref 0.0–0.1)
Basophils Relative: 0 %
Eosinophils Absolute: 0.2 10*3/uL (ref 0.0–0.5)
Eosinophils Relative: 1 %
HCT: 23.6 % — ABNORMAL LOW (ref 36.0–46.0)
Hemoglobin: 7.1 g/dL — ABNORMAL LOW (ref 12.0–15.0)
Lymphocytes Relative: 37 %
Lymphs Abs: 5.6 10*3/uL — ABNORMAL HIGH (ref 0.7–4.0)
MCH: 26.1 pg (ref 26.0–34.0)
MCHC: 30.1 g/dL (ref 30.0–36.0)
MCV: 86.8 fL (ref 80.0–100.0)
Monocytes Absolute: 0.5 10*3/uL (ref 0.1–1.0)
Monocytes Relative: 3 %
Myelocytes: 1 %
Neutro Abs: 8.8 10*3/uL — ABNORMAL HIGH (ref 1.7–7.7)
Neutrophils Relative %: 58 %
Platelets: 598 10*3/uL — ABNORMAL HIGH (ref 150–400)
RBC: 2.72 MIL/uL — ABNORMAL LOW (ref 3.87–5.11)
RDW: 17.6 % — ABNORMAL HIGH (ref 11.5–15.5)
WBC: 15.1 10*3/uL — ABNORMAL HIGH (ref 4.0–10.5)
nRBC: 0.1 % (ref 0.0–0.2)

## 2020-11-18 LAB — BASIC METABOLIC PANEL
Anion gap: 14 (ref 5–15)
BUN: 64 mg/dL — ABNORMAL HIGH (ref 8–23)
CO2: 30 mmol/L (ref 22–32)
Calcium: 9.2 mg/dL (ref 8.9–10.3)
Chloride: 84 mmol/L — ABNORMAL LOW (ref 98–111)
Creatinine, Ser: 2.85 mg/dL — ABNORMAL HIGH (ref 0.44–1.00)
GFR, Estimated: 17 mL/min — ABNORMAL LOW (ref >60)
Glucose, Bld: 93 mg/dL (ref 70–99)
Potassium: 2.9 mmol/L — ABNORMAL LOW (ref 3.5–5.1)
Sodium: 128 mmol/L — ABNORMAL LOW (ref 135–145)

## 2020-11-18 MED ORDER — POTASSIUM CHLORIDE 10 MEQ/100ML IV SOLN
10.0000 meq | Freq: Once | INTRAVENOUS | Status: AC
  Administered 2020-11-18: 10 meq via INTRAVENOUS
  Filled 2020-11-18: qty 100

## 2020-11-18 MED ORDER — VANCOMYCIN HCL IN DEXTROSE 1-5 GM/200ML-% IV SOLN
1000.0000 mg | Freq: Once | INTRAVENOUS | Status: AC
  Administered 2020-11-18: 1000 mg via INTRAVENOUS
  Filled 2020-11-18: qty 200

## 2020-11-18 MED ORDER — FENTANYL CITRATE (PF) 100 MCG/2ML IJ SOLN
25.0000 ug | Freq: Once | INTRAMUSCULAR | Status: AC
  Administered 2020-11-18: 25 ug via INTRAVENOUS
  Filled 2020-11-18: qty 2

## 2020-11-18 MED ORDER — HYDROCODONE-ACETAMINOPHEN 5-325 MG PO TABS
ORAL_TABLET | ORAL | 0 refills | Status: DC
Start: 2020-11-18 — End: 2020-11-23

## 2020-11-18 MED ORDER — POTASSIUM CHLORIDE CRYS ER 20 MEQ PO TBCR
40.0000 meq | EXTENDED_RELEASE_TABLET | Freq: Once | ORAL | Status: AC
  Administered 2020-11-18: 40 meq via ORAL
  Filled 2020-11-18: qty 2

## 2020-11-18 MED ORDER — DOXYCYCLINE HYCLATE 100 MG PO CAPS: 100.0000 mg | ORAL_CAPSULE | Freq: Two times a day (BID) | ORAL | 0 refills | Status: DC

## 2020-11-18 NOTE — Discharge Instructions (Addendum)
As discussed, you likely have a infection of your right fifth toe.  Take the antibiotic prescribed as prescribed.  The hydrocodone should help with the pain of your toe.  Follow-up with your primary care provider for recheck on Monday.  Also, your potassium level is low.  It is important that you take your potassium supplement daily.  You have been given IV and p.o. potassium here today.  Also please follow-up with your nephrologist next week.  Return to the emergency department if you develop any worsening symptoms such as increasing pain or fever.

## 2020-11-18 NOTE — ED Triage Notes (Signed)
Pt reports right small toe pain and has been through 2 rounds of tx . Pt took one week of prednisone. Pt went to podiatrist on Monday and Wednesday and received injection. Pt reports pain may have something to do with occlusion

## 2020-11-18 NOTE — ED Provider Notes (Signed)
[facility] EMERGENCY DEPARTMENT Provider Note   CSN: [id number] Arrival date & time: 11/18/20  0719     History Chief Complaint  Patient presents with  . Toe Pain    ALEETA SCHMALTZ is a 74 y.o. female.  HPI     ARTHUR AYDELOTTE is a 74 y.o. female with past medical history of hypertension, renal insufficiency, anemia, and paroxysmal atrial fib anticoagulated with Eliquis.  She presents to the Emergency Department complaining of sharp stabbing pains of her right little toe.  Symptoms have been present for 2 weeks.  She states that she was initially treated for gout of the toe without improvement.  She went to a podiatrist for evaluation and was giving injections in her toe on Monday and Wednesday.  She states the medication has helped with the swelling of the toe, but she continues to have pain.  She was advised to come to the emergency department for further evaluation.  She denies numbness of her foot, discoloration of the toe, recent injury or pain proximal to the toe.  No history of diabetes.     Past Medical History:  Diagnosis Date  . Constipation 07/17/2017  . GERD (gastroesophageal reflux disease)   . Gout   . Hyperlipidemia   . Hypertension   . Hypokalemia   . OSA (obstructive sleep apnea) 08/09/2020  . PAF (paroxysmal atrial fibrillation) ([place])   . PAF (paroxysmal atrial fibrillation) ([place])   . PUD (peptic ulcer disease)   . Syncope    Neurocardiogenic    Patient Active Problem List   Diagnosis Date Noted  . Symptomatic bradycardia 10/20/2020  . AKI (acute kidney injury) ([place]) 10/20/2020  . Microcytic anemia 10/20/2020  . OSA (obstructive sleep apnea) 08/09/2020  . Paroxysmal atrial fibrillation ([place]) 06/15/2020  . Bradycardia with 31-40 beats per minute 06/15/2020  . Snoring 06/15/2020  . Intermittent palpitations 06/15/2020  . Near syncope 06/15/2020  . Hypertension, uncontrolled 06/15/2020  . Atrial fibrillation with RVR ([place]) 05/04/2020  .  Hyperlipidemia   . Gout   . Encounter for screening colonoscopy   . Hemorrhoid 12/16/2018  . GERD (gastroesophageal reflux disease) 01/29/2018  . Constipation 07/17/2017  . Rectal bleeding 01/30/2017  . Chest pain 12/09/2013  . Renal insufficiency 05/01/2012  . Essential hypertension, benign 05/03/2010  . VASOVAGAL SYNCOPE 05/03/2010    Past Surgical History:  Procedure Laterality Date  . ABDOMINAL HYSTERECTOMY    . CATARACT EXTRACTION Bilateral   . COLONOSCOPY N/A 10/07/2014   Dr. Oneida Alar: redundant left colon, moderate sized external hemorrhoids   . COLONOSCOPY N/A 03/11/2020   Procedure: COLONOSCOPY;  Surgeon: Danie Binder, MD;  Location: [facility] ENDO SUITE;  Service: Endoscopy;  Laterality: N/A;  9:30am w/ overtube  . LIPOMA RESECTION    . POLYPECTOMY  03/11/2020   Procedure: POLYPECTOMY;  Surgeon: Danie Binder, MD;  Location: [facility] ENDO SUITE;  Service: Endoscopy;;  cecal, transverse, descending, sigmoid     OB History   No obstetric history on file.     Family History  Problem Relation Age of Onset  . Stroke Mother   . Dementia Father   . Cancer - Other Sister   . Atrial fibrillation Sister   . Heart failure Sister     Social History   Tobacco Use  . Smoking status: Former Smoker    Packs/day: 1.00    Years: 20.00    Pack years: 20.00    Types: Cigarettes    Start date: 03/16/1964  Quit date: 10/07/1997    Years since quitting: 23.1  . Smokeless tobacco: Never Used  Vaping Use  . Vaping Use: Never used  Substance Use Topics  . Alcohol use: No    Alcohol/week: 0.0 standard drinks  . Drug use: No    Home Medications Prior to Admission medications   Medication Sig Start Date End Date Taking? Authorizing Provider  acetaminophen (TYLENOL) 500 MG tablet Take 1,000 mg by mouth every 6 (six) hours as needed for moderate pain.    [provider]  albuterol (VENTOLIN HFA) 108 (90 Base) MCG/ACT inhaler Inhale 1-2 puffs into the lungs every 6 (six)  hours as needed for wheezing or shortness of breath.  09/23/20   [provider]  apixaban (ELIQUIS) 5 MG TABS tablet Take 1 tablet (5 mg total) by mouth 2 (two) times daily. 07/25/20 07/20/21  Satira Sark, MD  APPLE CIDER VINEGAR PO Take 2 capsules by mouth daily.    [provider]  cetirizine (ZYRTEC) 10 MG tablet Take 10 mg by mouth daily as needed for allergies.    [provider]  Cholecalciferol (VITAMIN D-3) 25 MCG (1000 UT) CAPS Take 1 capsule by mouth daily.    [provider]  colchicine 0.6 MG tablet Take 0.6 mg by mouth daily. 01/31/20   [provider]  cyclobenzaprine (FLEXERIL) 5 MG tablet Take 5 mg by mouth 3 (three) times daily as needed for muscle spasms.  06/19/20   [provider]  cyproheptadine (PERIACTIN) 4 MG tablet Take 4 mg by mouth 3 (three) times daily as needed for allergies.    [provider]  diclofenac Sodium (VOLTAREN) 1 % GEL Apply 1 application topically 4 (four) times daily as needed (pain).    [provider]  ELDERBERRY PO Take 2 capsules by mouth daily.     [provider]  ferrous sulfate 325 (65 FE) MG tablet Take 325 mg by mouth daily with breakfast.    [provider]  flecainide (TAMBOCOR) 50 MG tablet Take 1 tablet (50 mg total) by mouth 2 (two) times daily. 10/25/20 11/24/20  Darliss Cheney, MD  fluticasone (FLONASE) 50 MCG/ACT nasal spray Place 1 spray into both nostrils daily as needed for allergies or rhinitis.    [provider]  Fluticasone-Salmeterol (ADVAIR) 500-50 MCG/DOSE AEPB Inhale 1 puff into the lungs every 12 (twelve) hours.    [provider]  furosemide (LASIX) 40 MG tablet Take 1 tablet (40 mg total) by mouth daily. 10/27/20 01/25/21  Darliss Cheney, MD  hydrALAZINE (APRESOLINE) 50 MG tablet Take 1 tablet (50 mg total) by mouth 3 (three) times daily. 10/25/20 11/24/20  Darliss Cheney, MD  losartan (COZAAR) 25 MG tablet Take 0.5 tablets  (12.5 mg total) by mouth daily. 10/27/20   Darliss Cheney, MD  lubiprostone (AMITIZA) 24 MCG capsule Take 1 capsule (24 mcg total) by mouth in the morning and at bedtime. 09/15/20 09/15/21  Eloise Harman, DO  meclizine (ANTIVERT) 25 MG tablet Take 25 mg by mouth 3 (three) times daily as needed for dizziness.     [provider]  omeprazole (PRILOSEC) 40 MG capsule Take 40 mg by mouth daily.      [provider]  potassium chloride SA (KLOR-CON) 20 MEQ tablet Take 20 mEq by mouth in the morning, at noon, and at bedtime.    [provider]  simvastatin (ZOCOR) 40 MG tablet Take 40 mg by mouth daily in the afternoon.  [provider]  triamterene-hydrochlorothiazide (MAXZIDE) 75-50 MG tablet Take 1 tablet by mouth daily. 10/27/20   Darliss Cheney, MD  vitamin C (ASCORBIC ACID) 250 MG tablet Take 500 mg by mouth daily.    [provider]    Allergies    Acetaminophen-codeine, Allopurinol, Amlodipine, Amoxicillin, and Tramadol  Review of Systems   Review of Systems  Constitutional: Negative for chills and fever.  Respiratory: Negative for shortness of breath.   Cardiovascular: Negative for chest pain.  Gastrointestinal: Negative for nausea and vomiting.  Genitourinary: Negative for difficulty urinating and dysuria.  Musculoskeletal: Positive for arthralgias (right little toe pain). Negative for back pain and joint swelling.  Skin: Negative for color change, pallor and wound.  Neurological: Negative for weakness and numbness.    Physical Exam Updated Vital Signs BP (!) 121/59   Pulse 60   Temp 99.1 F (37.3 C) (Oral)   Resp 16   Ht 5\' 2"  (1.575 m)   Wt 68.9 kg   SpO2 100%   BMI 27.80 kg/m   Physical Exam Vitals and nursing note reviewed.  Constitutional:      General: She is not in acute distress.    Appearance: Normal appearance. She is not ill-appearing.  HENT:     Head: Atraumatic.  Cardiovascular:     Rate and Rhythm: Normal  rate and regular rhythm.     Pulses: Normal pulses.     Comments: Dorsalis pedis and posterior tibial pulses are heard with portable Doppler.  Pulses are symmetrical. Pulmonary:     Effort: Pulmonary effort is normal.  Abdominal:     Palpations: Abdomen is soft.     Tenderness: There is no abdominal tenderness.  Musculoskeletal:        General: Tenderness present. No swelling.     Comments: Diffuse ttp of the right fifth toe.  There is a chronic appearing wound to the plantar surface of the toe with eschar.  No drainage.  No erythema or significant edema. No tenderness proximally.    Skin:    General: Skin is warm.     Capillary Refill: Capillary refill takes 2 to 3 seconds.  Neurological:     General: No focal deficit present.     Mental Status: She is alert.     Sensory: No sensory deficit.     Motor: No weakness.     ED Results / Procedures / Treatments   Labs (all labs ordered are listed, but only abnormal results are displayed) Labs Reviewed  BASIC METABOLIC PANEL - Abnormal; Notable for the following components:      Result Value   Sodium 128 (*)    Potassium 2.9 (*)    Chloride 84 (*)    BUN 64 (*)    Creatinine, Ser 2.85 (*)    GFR, Estimated 17 (*)    All other components within normal limits  CBC WITH DIFFERENTIAL/PLATELET - Abnormal; Notable for the following components:   WBC 15.1 (*)    RBC 2.72 (*)    Hemoglobin 7.1 (*)    HCT 23.6 (*)    RDW 17.6 (*)    Platelets 598 (*)    Neutro Abs 8.8 (*)    Lymphs Abs 5.6 (*)    All other components within normal limits  PATHOLOGIST SMEAR REVIEW    EKG None  Radiology DG Toe 5th Right  Result Date: 11/18/2020 CLINICAL DATA:  Right toe pain.  No known injury.  History of gout. EXAM: RIGHT FIFTH  TOE COMPARISON:  None. FINDINGS: There is no evidence of fracture or dislocation. Joint spaces are preserved. Suspected erosion along the lateral cortex of the fifth toe middle phalanx. There is small mineralized  densities within the soft tissues along the lateral aspect of the fifth toe near the proximal interphalangeal joint. IMPRESSION: Suspected erosion along the lateral cortex of the fifth toe middle phalanx. Small mineralized densities within the soft tissues along the lateral aspect of the fifth toe near the proximal interphalangeal joint. 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 Electronically Signed   By: Davina Poke D.O.   On: 11/18/2020 09:45    Procedures Procedures (including critical care time)  Medications Ordered in ED Medications  fentaNYL (SUBLIMAZE) injection 25 mcg (25 mcg Intravenous Given 11/18/20 0938)  potassium chloride 10 mEq in 100 mL IVPB (0 mEq Intravenous Stopped 11/18/20 1251)  potassium chloride SA (KLOR-CON) CR tablet 40 mEq (40 mEq Oral Given 11/18/20 1144)  vancomycin (VANCOCIN) IVPB 1000 mg/200 mL premix (0 mg Intravenous Stopped 11/18/20 1252)    ED Course  I have reviewed the triage vital signs and the nursing notes.  Pertinent labs & imaging results that were available during my care of the patient were reviewed by me and considered in my medical decision making (see chart for details).    MDM Rules/Calculators/A&P                          Patient here with chronic appearing wound to the plantar surface of the right fifth toe.  She has been currently treated by podiatry for gout.  No improvement of pain.  Patient concerned about occlusion.  She is currently anticoagulated with Eliquis, she has dorsalis pedis and posterior tibial pulses of both feet and pulses are symmetrical.  Skin is warm and dry.  X-ray without evidence for osteomyelitis.  Labs show significant hypokalemia.  She states this is baseline for her.  She also has worsening of her renal insufficiency.  She is followed by nephrology.  Given IV and p.o. potassium here.  She also has a leukocytosis and low-grade fever.  Clinically, the appearance of the  toe seems consistent with cellulitis.  She will be treated with IV vancomycin here.  There is no concerning symptoms for sepsis.  Patient also seen by Dr. Roderic Palau and care plan discussed.  Will start on oral antibiotics and she has appointment with her PCP in [place] on Monday.  She appears appropriate for discharge home.     Final Clinical Impression(s) / ED Diagnoses Final diagnoses:  Cellulitis of toe of right foot  Hypokalemia    Rx / DC Orders ED Discharge Orders    None       Kem Parkinson, PA-C 11/21/20 1331    Milton Ferguson, MD 11/22/20 [phone]

## 2020-11-21 LAB — PATHOLOGIST SMEAR REVIEW

## 2020-11-23 ENCOUNTER — Observation Stay (HOSPITAL_COMMUNITY): Payer: Medicare HMO

## 2020-11-23 ENCOUNTER — Ambulatory Visit: Payer: Medicare HMO | Admitting: Internal Medicine

## 2020-11-23 ENCOUNTER — Emergency Department (HOSPITAL_COMMUNITY): Payer: Medicare HMO

## 2020-11-23 ENCOUNTER — Ambulatory Visit: Payer: PRIVATE HEALTH INSURANCE | Admitting: Internal Medicine

## 2020-11-23 ENCOUNTER — Inpatient Hospital Stay (HOSPITAL_COMMUNITY)
Admission: EM | Admit: 2020-11-23 | Discharge: 2020-11-28 | DRG: 811 | Disposition: A | Discharge status: Home / Self Care | Payer: Medicare HMO | Attending: Internal Medicine | Admitting: Internal Medicine

## 2020-11-23 ENCOUNTER — Encounter (HOSPITAL_COMMUNITY): Payer: Self-pay | Admitting: Emergency Medicine

## 2020-11-23 ENCOUNTER — Other Ambulatory Visit: Payer: Self-pay

## 2020-11-23 DIAGNOSIS — K219 Gastro-esophageal reflux disease without esophagitis: Secondary | ICD-10-CM | POA: Diagnosis present

## 2020-11-23 DIAGNOSIS — N184 Chronic kidney disease, stage 4 (severe): Secondary | ICD-10-CM | POA: Diagnosis present

## 2020-11-23 DIAGNOSIS — I5032 Chronic diastolic (congestive) heart failure: Secondary | ICD-10-CM | POA: Diagnosis present

## 2020-11-23 DIAGNOSIS — Z20822 Contact with and (suspected) exposure to covid-19: Secondary | ICD-10-CM | POA: Diagnosis present

## 2020-11-23 DIAGNOSIS — E861 Hypovolemia: Secondary | ICD-10-CM | POA: Diagnosis present

## 2020-11-23 DIAGNOSIS — Z823 Family history of stroke: Secondary | ICD-10-CM

## 2020-11-23 DIAGNOSIS — E876 Hypokalemia: Secondary | ICD-10-CM | POA: Diagnosis present

## 2020-11-23 DIAGNOSIS — D649 Anemia, unspecified: Secondary | ICD-10-CM | POA: Diagnosis present

## 2020-11-23 DIAGNOSIS — Z880 Allergy status to penicillin: Secondary | ICD-10-CM

## 2020-11-23 DIAGNOSIS — N179 Acute kidney failure, unspecified: Secondary | ICD-10-CM | POA: Diagnosis present

## 2020-11-23 DIAGNOSIS — L089 Local infection of the skin and subcutaneous tissue, unspecified: Secondary | ICD-10-CM | POA: Diagnosis present

## 2020-11-23 DIAGNOSIS — Z9842 Cataract extraction status, left eye: Secondary | ICD-10-CM

## 2020-11-23 DIAGNOSIS — E871 Hypo-osmolality and hyponatremia: Secondary | ICD-10-CM | POA: Diagnosis present

## 2020-11-23 DIAGNOSIS — Z9841 Cataract extraction status, right eye: Secondary | ICD-10-CM

## 2020-11-23 DIAGNOSIS — Z8249 Family history of ischemic heart disease and other diseases of the circulatory system: Secondary | ICD-10-CM

## 2020-11-23 DIAGNOSIS — E785 Hyperlipidemia, unspecified: Secondary | ICD-10-CM | POA: Diagnosis present

## 2020-11-23 DIAGNOSIS — I10 Essential (primary) hypertension: Secondary | ICD-10-CM | POA: Diagnosis present

## 2020-11-23 DIAGNOSIS — Z87891 Personal history of nicotine dependence: Secondary | ICD-10-CM

## 2020-11-23 DIAGNOSIS — K31811 Angiodysplasia of stomach and duodenum with bleeding: Secondary | ICD-10-CM | POA: Diagnosis present

## 2020-11-23 DIAGNOSIS — Z8711 Personal history of peptic ulcer disease: Secondary | ICD-10-CM

## 2020-11-23 DIAGNOSIS — I480 Paroxysmal atrial fibrillation: Secondary | ICD-10-CM | POA: Diagnosis present

## 2020-11-23 DIAGNOSIS — R55 Syncope and collapse: Secondary | ICD-10-CM | POA: Diagnosis present

## 2020-11-23 DIAGNOSIS — S91109A Unspecified open wound of unspecified toe(s) without damage to nail, initial encounter: Secondary | ICD-10-CM

## 2020-11-23 DIAGNOSIS — N189 Chronic kidney disease, unspecified: Secondary | ICD-10-CM

## 2020-11-23 DIAGNOSIS — M109 Gout, unspecified: Secondary | ICD-10-CM | POA: Diagnosis present

## 2020-11-23 DIAGNOSIS — K5521 Angiodysplasia of colon with hemorrhage: Secondary | ICD-10-CM | POA: Diagnosis present

## 2020-11-23 DIAGNOSIS — I890 Lymphedema, not elsewhere classified: Secondary | ICD-10-CM | POA: Diagnosis present

## 2020-11-23 DIAGNOSIS — I130 Hypertensive heart and chronic kidney disease with heart failure and stage 1 through stage 4 chronic kidney disease, or unspecified chronic kidney disease: Secondary | ICD-10-CM | POA: Diagnosis present

## 2020-11-23 DIAGNOSIS — K5900 Constipation, unspecified: Secondary | ICD-10-CM | POA: Diagnosis present

## 2020-11-23 DIAGNOSIS — Z79899 Other long term (current) drug therapy: Secondary | ICD-10-CM

## 2020-11-23 DIAGNOSIS — I5189 Other ill-defined heart diseases: Secondary | ICD-10-CM

## 2020-11-23 DIAGNOSIS — Z90710 Acquired absence of both cervix and uterus: Secondary | ICD-10-CM

## 2020-11-23 DIAGNOSIS — Z888 Allergy status to other drugs, medicaments and biological substances status: Secondary | ICD-10-CM

## 2020-11-23 DIAGNOSIS — G4733 Obstructive sleep apnea (adult) (pediatric): Secondary | ICD-10-CM | POA: Diagnosis present

## 2020-11-23 DIAGNOSIS — D509 Iron deficiency anemia, unspecified: Secondary | ICD-10-CM | POA: Diagnosis present

## 2020-11-23 DIAGNOSIS — D62 Acute posthemorrhagic anemia: Secondary | ICD-10-CM | POA: Diagnosis not present

## 2020-11-23 DIAGNOSIS — Z7901 Long term (current) use of anticoagulants: Secondary | ICD-10-CM

## 2020-11-23 HISTORY — DX: Other ill-defined heart diseases: I51.89

## 2020-11-23 LAB — COMPREHENSIVE METABOLIC PANEL
ALT: 11 U/L (ref 0–44)
AST: 15 U/L (ref 15–41)
Albumin: 3.7 g/dL (ref 3.5–5.0)
Alkaline Phosphatase: 50 U/L (ref 38–126)
Anion gap: 10 (ref 5–15)
BUN: 47 mg/dL — ABNORMAL HIGH (ref 8–23)
CO2: 29 mmol/L (ref 22–32)
Calcium: 8.7 mg/dL — ABNORMAL LOW (ref 8.9–10.3)
Chloride: 84 mmol/L — ABNORMAL LOW (ref 98–111)
Creatinine, Ser: 3.57 mg/dL — ABNORMAL HIGH (ref 0.44–1.00)
GFR, Estimated: 13 mL/min — ABNORMAL LOW (ref >60)
Glucose, Bld: 111 mg/dL — ABNORMAL HIGH (ref 70–99)
Potassium: 2.8 mmol/L — ABNORMAL LOW (ref 3.5–5.1)
Sodium: 123 mmol/L — ABNORMAL LOW (ref 135–145)
Total Bilirubin: 0.3 mg/dL (ref 0.3–1.2)
Total Protein: 6.7 g/dL (ref 6.5–8.1)

## 2020-11-23 LAB — POC OCCULT BLOOD, ED: Fecal Occult Bld: POSITIVE — AB

## 2020-11-23 LAB — URINALYSIS, ROUTINE W REFLEX MICROSCOPIC
Bacteria, UA: NONE SEEN
Bilirubin Urine: NEGATIVE
Glucose, UA: NEGATIVE mg/dL
Ketones, ur: NEGATIVE mg/dL
Leukocytes,Ua: NEGATIVE
Nitrite: NEGATIVE
Protein, ur: NEGATIVE mg/dL
Specific Gravity, Urine: 1.008 (ref 1.005–1.030)
pH: 6 (ref 5.0–8.0)

## 2020-11-23 LAB — CBC WITH DIFFERENTIAL/PLATELET
Abs Immature Granulocytes: 0.04 10*3/uL (ref 0.00–0.07)
Basophils Absolute: 0 10*3/uL (ref 0.0–0.1)
Basophils Relative: 0 %
Eosinophils Absolute: 0.3 10*3/uL (ref 0.0–0.5)
Eosinophils Relative: 4 %
HCT: 22.2 % — ABNORMAL LOW (ref 36.0–46.0)
Hemoglobin: 6.8 g/dL — CL (ref 12.0–15.0)
Immature Granulocytes: 0 %
Lymphocytes Relative: 36 %
Lymphs Abs: 3.3 10*3/uL (ref 0.7–4.0)
MCH: 25.6 pg — ABNORMAL LOW (ref 26.0–34.0)
MCHC: 30.6 g/dL (ref 30.0–36.0)
MCV: 83.5 fL (ref 80.0–100.0)
Monocytes Absolute: 0.7 10*3/uL (ref 0.1–1.0)
Monocytes Relative: 7 %
Neutro Abs: 4.8 10*3/uL (ref 1.7–7.7)
Neutrophils Relative %: 53 %
Platelets: 453 10*3/uL — ABNORMAL HIGH (ref 150–400)
RBC: 2.66 MIL/uL — ABNORMAL LOW (ref 3.87–5.11)
RDW: 16.2 % — ABNORMAL HIGH (ref 11.5–15.5)
WBC: 9.1 10*3/uL (ref 4.0–10.5)
nRBC: 0 % (ref 0.0–0.2)

## 2020-11-23 LAB — RESP PANEL BY RT-PCR (FLU A&B, COVID) ARPGX2
Influenza A by PCR: NEGATIVE
Influenza B by PCR: NEGATIVE
SARS Coronavirus 2 by RT PCR: NEGATIVE

## 2020-11-23 LAB — PREPARE RBC (CROSSMATCH)

## 2020-11-23 LAB — ABO/RH: ABO/RH(D): O POS

## 2020-11-23 LAB — CBG MONITORING, ED: Glucose-Capillary: 102 mg/dL — ABNORMAL HIGH (ref 70–99)

## 2020-11-23 MED ORDER — LUBIPROSTONE 24 MCG PO CAPS
24.0000 ug | ORAL_CAPSULE | Freq: Two times a day (BID) | ORAL | Status: DC
  Administered 2020-11-23 – 2020-11-28 (×9): 24 ug via ORAL
  Filled 2020-11-23 (×13): qty 1

## 2020-11-23 MED ORDER — PANTOPRAZOLE SODIUM 40 MG IV SOLR
40.0000 mg | Freq: Two times a day (BID) | INTRAVENOUS | Status: DC
  Administered 2020-11-23 – 2020-11-26 (×6): 40 mg via INTRAVENOUS
  Filled 2020-11-23 (×6): qty 40

## 2020-11-23 MED ORDER — SULFAMETHOXAZOLE-TRIMETHOPRIM 800-160 MG PO TABS
1.0000 | ORAL_TABLET | Freq: Two times a day (BID) | ORAL | Status: DC
  Administered 2020-11-23 – 2020-11-28 (×9): 1 via ORAL
  Filled 2020-11-23 (×10): qty 1

## 2020-11-23 MED ORDER — POTASSIUM CHLORIDE CRYS ER 20 MEQ PO TBCR
20.0000 meq | EXTENDED_RELEASE_TABLET | Freq: Once | ORAL | Status: AC
  Administered 2020-11-23: 20 meq via ORAL
  Filled 2020-11-23: qty 1

## 2020-11-23 MED ORDER — FLECAINIDE ACETATE 50 MG PO TABS
50.0000 mg | ORAL_TABLET | Freq: Two times a day (BID) | ORAL | Status: DC
  Administered 2020-11-23 – 2020-11-28 (×9): 50 mg via ORAL
  Filled 2020-11-23 (×13): qty 1

## 2020-11-23 MED ORDER — POTASSIUM CHLORIDE 10 MEQ/100ML IV SOLN
10.0000 meq | INTRAVENOUS | Status: DC
  Administered 2020-11-23 (×2): 10 meq via INTRAVENOUS
  Filled 2020-11-23 (×2): qty 100

## 2020-11-23 MED ORDER — ALBUTEROL SULFATE HFA 108 (90 BASE) MCG/ACT IN AERS
2.0000 | INHALATION_SPRAY | Freq: Once | RESPIRATORY_TRACT | Status: AC
  Administered 2020-11-23: 2 via RESPIRATORY_TRACT
  Filled 2020-11-23: qty 6.7

## 2020-11-23 MED ORDER — ONDANSETRON HCL 4 MG PO TABS: 4.0000 mg | ORAL_TABLET | Freq: Four times a day (QID) | ORAL | Status: DC | PRN

## 2020-11-23 MED ORDER — ALBUTEROL SULFATE HFA 108 (90 BASE) MCG/ACT IN AERS: 1.0000 | INHALATION_SPRAY | Freq: Four times a day (QID) | RESPIRATORY_TRACT | Status: DC | PRN

## 2020-11-23 MED ORDER — SODIUM CHLORIDE 0.9% IV SOLUTION: Freq: Once | INTRAVENOUS | Status: AC

## 2020-11-23 MED ORDER — ONDANSETRON HCL 4 MG/2ML IJ SOLN: 4.0000 mg | Freq: Four times a day (QID) | INTRAMUSCULAR | Status: DC | PRN

## 2020-11-23 MED ORDER — GABAPENTIN 300 MG PO CAPS
300.0000 mg | ORAL_CAPSULE | Freq: Three times a day (TID) | ORAL | Status: DC
  Administered 2020-11-23 – 2020-11-28 (×13): 300 mg via ORAL
  Filled 2020-11-23 (×14): qty 1

## 2020-11-23 MED ORDER — SODIUM CHLORIDE 0.9 % IV SOLN: INTRAVENOUS | Status: AC

## 2020-11-23 NOTE — ED Notes (Signed)
Date and time results received: 11/23/20 1917  Test: Hgb Critical Value: 6.8  Name of Provider Notified: [place], PA  Orders Received? Or Actions Taken?: n/a

## 2020-11-23 NOTE — ED Triage Notes (Signed)
Pt c/o fatigue X5 days. Hbg was 7.1 on 11/26 at her PCP.

## 2020-11-23 NOTE — ED Notes (Signed)
Pt rushed back to room for apparent LOC. Pt stood up with assistance and placed in the bed. Once in bed, pt woke up and said "I must be passing out." Pt A&O. All vitals WDL including CBG.

## 2020-11-23 NOTE — ED Provider Notes (Addendum)
[facility] EMERGENCY DEPARTMENT Provider Note   CSN: [id number] Arrival date & time: 11/23/20  1523     History Chief Complaint  Patient presents with  . Fatigue    [name] [name] is a 74 y.o. female presenting for evaluation of weakness.  Patient states the past week, she has been feeling very weak and tired.  She reports associated dizziness dizziness and lightheadedness.  She saw her PCP today, who rechecked lab work which showed acute anemia, 7 today, recently hemoglobin was 10.  Additionally, patient had a work-up worsening kidney function.  Patient denies known source of blood loss.  Denies nausea, vomiting, hematemesis.  She denies abnormal bowel movements or blood in her stool.  She denies recent fevers, chills, chest pain, cough, abdominal pain, urinary symptoms.  She is on Eliquis for A. fib, no recent change in dosing.  No other recent medication changes.  She has never had GI bleed before.  Last colonoscopy was in March 2021, showed polyps but no bleed.  She follows with Dr. Abbey Chatters from [facility].  Additional history taken chart review.  Patient with a history of GERD, gout, CHF, hypertension, hyperlipidemia, PAF on anticoagulation, CKD  HPI     Past Medical History:  Diagnosis Date  . Constipation 07/17/2017  . GERD (gastroesophageal reflux disease)   . Gout   . Grade II diastolic dysfunction 41/06/4080  . Hyperlipidemia   . Hypertension   . Hypokalemia   . OSA (obstructive sleep apnea) 08/09/2020  . PAF (paroxysmal atrial fibrillation) ([place])   . PAF (paroxysmal atrial fibrillation) ([place])   . PUD (peptic ulcer disease)   . Syncope    Neurocardiogenic    Patient Active Problem List   Diagnosis Date Noted  . Symptomatic anemia 11/23/2020  . Grade II diastolic dysfunction 44/81/8563  . Symptomatic bradycardia 10/20/2020  . AKI (acute kidney injury) ([place]) 10/20/2020  . Microcytic anemia 10/20/2020  . OSA (obstructive sleep apnea)  08/09/2020  . Paroxysmal atrial fibrillation ([place]) 06/15/2020  . Bradycardia with 31-40 beats per minute 06/15/2020  . Snoring 06/15/2020  . Intermittent palpitations 06/15/2020  . Near syncope 06/15/2020  . Hypertension, uncontrolled 06/15/2020  . Atrial fibrillation with RVR ([place]) 05/04/2020  . Hyperlipidemia   . Gout   . Encounter for screening colonoscopy   . Hemorrhoid 12/16/2018  . GERD (gastroesophageal reflux disease) 01/29/2018  . Constipation 07/17/2017  . Rectal bleeding 01/30/2017  . Chest pain 12/09/2013  . Hypokalemia 05/01/2012  . Renal insufficiency 05/01/2012  . Essential hypertension, benign 05/03/2010  . VASOVAGAL SYNCOPE 05/03/2010    Past Surgical History:  Procedure Laterality Date  . ABDOMINAL HYSTERECTOMY    . CATARACT EXTRACTION Bilateral   . COLONOSCOPY N/A 10/07/2014   Dr. Oneida Alar: redundant left colon, moderate sized external hemorrhoids   . COLONOSCOPY N/A 03/11/2020   Procedure: COLONOSCOPY;  Surgeon: Danie Binder, MD;  Location: [facility] ENDO SUITE;  Service: Endoscopy;  Laterality: N/A;  9:30am w/ overtube  . LIPOMA RESECTION    . POLYPECTOMY  03/11/2020   Procedure: POLYPECTOMY;  Surgeon: Danie Binder, MD;  Location: [facility] ENDO SUITE;  Service: Endoscopy;;  cecal, transverse, descending, sigmoid     OB History   No obstetric history on file.     Family History  Problem Relation Age of Onset  . Stroke Mother   . Dementia Father   . Cancer - Other Sister   . Atrial fibrillation Sister   . Heart failure Sister  Social History   Tobacco Use  . Smoking status: Former Smoker    Packs/day: 1.00    Years: 20.00    Pack years: 20.00    Types: Cigarettes    Start date: 03/16/1964    Quit date: 10/07/1997    Years since quitting: 23.1  . Smokeless tobacco: Never Used  Vaping Use  . Vaping Use: Never used  Substance Use Topics  . Alcohol use: No    Alcohol/week: 0.0 standard drinks  . Drug use: No    Home Medications Prior to  Admission medications   Medication Sig Start Date End Date Taking? Authorizing Provider  acetaminophen (TYLENOL) 500 MG tablet Take 1,000 mg by mouth every 6 (six) hours as needed for moderate pain.    [provider]  albuterol (VENTOLIN HFA) 108 (90 Base) MCG/ACT inhaler Inhale 1-2 puffs into the lungs every 6 (six) hours as needed for wheezing or shortness of breath.  09/23/20   [provider]  apixaban (ELIQUIS) 5 MG TABS tablet Take 1 tablet (5 mg total) by mouth 2 (two) times daily. 07/25/20 07/20/21  Satira Sark, MD  APPLE CIDER VINEGAR PO Take 2 capsules by mouth daily.    [provider]  cetirizine (ZYRTEC) 10 MG tablet Take 10 mg by mouth daily as needed for allergies.    [provider]  Cholecalciferol (VITAMIN D-3) 25 MCG (1000 UT) CAPS Take 1 capsule by mouth daily.    [provider]  colchicine 0.6 MG tablet Take 0.6 mg by mouth daily. 01/31/20   [provider]  cyclobenzaprine (FLEXERIL) 5 MG tablet Take 5 mg by mouth 3 (three) times daily as needed for muscle spasms.  06/19/20   [provider]  cyproheptadine (PERIACTIN) 4 MG tablet Take 4 mg by mouth 3 (three) times daily as needed for allergies.    [provider]  diclofenac Sodium (VOLTAREN) 1 % GEL Apply 1 application topically 4 (four) times daily as needed (pain).    [provider]  doxycycline (VIBRAMYCIN) 100 MG capsule Take 1 capsule (100 mg total) by mouth 2 (two) times daily. 11/18/20   Triplett, Tammy, PA-C  ELDERBERRY PO Take 2 capsules by mouth daily.     [provider]  ferrous sulfate 325 (65 FE) MG tablet Take 325 mg by mouth daily with breakfast.    [provider]  flecainide (TAMBOCOR) 50 MG tablet Take 1 tablet (50 mg total) by mouth 2 (two) times daily. 10/25/20 11/24/20  Darliss Cheney, MD  fluticasone (FLONASE) 50 MCG/ACT nasal spray Place 1 spray into both nostrils daily as needed for allergies or rhinitis.     [provider]  Fluticasone-Salmeterol (ADVAIR) 500-50 MCG/DOSE AEPB Inhale 1 puff into the lungs every 12 (twelve) hours.    [provider]  furosemide (LASIX) 40 MG tablet Take 1 tablet (40 mg total) by mouth daily. 10/27/20 01/25/21  Darliss Cheney, MD  hydrALAZINE (APRESOLINE) 50 MG tablet Take 1 tablet (50 mg total) by mouth 3 (three) times daily. 10/25/20 11/24/20  Darliss Cheney, MD  HYDROcodone-acetaminophen (NORCO/VICODIN) 5-325 MG tablet Take one tab po q 4 hrs prn pain 11/18/20   Triplett, Tammy, PA-C  losartan (COZAAR) 25 MG tablet Take 0.5 tablets (12.5 mg total) by mouth daily. 10/27/20   Darliss Cheney, MD  lubiprostone (AMITIZA) 24 MCG capsule Take 1 capsule (24 mcg total) by mouth in the morning and at bedtime. 09/15/20 09/15/21  Eloise Harman, DO  meclizine Johnathan Hausen)  25 MG tablet Take 25 mg by mouth 3 (three) times daily as needed for dizziness.     [provider]  omeprazole (PRILOSEC) 40 MG capsule Take 40 mg by mouth daily.      [provider]  potassium chloride SA (KLOR-CON) 20 MEQ tablet Take 20 mEq by mouth in the morning, at noon, and at bedtime.    [provider]  simvastatin (ZOCOR) 40 MG tablet Take 40 mg by mouth daily in the afternoon.     [provider]  triamterene-hydrochlorothiazide (MAXZIDE) 75-50 MG tablet Take 1 tablet by mouth daily. 10/27/20   Darliss Cheney, MD  vitamin C (ASCORBIC ACID) 250 MG tablet Take 500 mg by mouth daily.    [provider]    Allergies    Acetaminophen-codeine, Allopurinol, Amlodipine, Amoxicillin, Doxycycline, and Tramadol  Review of Systems   Review of Systems  Constitutional: Positive for fatigue.  Neurological: Positive for dizziness, weakness and light-headedness.  Hematological: Bruises/bleeds easily.  All other systems reviewed and are negative.   Physical Exam Updated Vital Signs BP (!) 129/46   Pulse 69   Temp 98.5 F (36.9 C) (Oral)   Resp 19    Ht 5\' 1"  (1.549 m)   Wt 68.5 kg   SpO2 100%   BMI 28.53 kg/m   Physical Exam Vitals and nursing note reviewed. Exam conducted with a chaperone present.  Constitutional:      General: She is not in acute distress.    Appearance: She is well-developed.     Comments: Appears pale  HENT:     Head: Normocephalic and atraumatic.  Eyes:     Extraocular Movements: Extraocular movements intact.     Conjunctiva/sclera: Conjunctivae normal.     Pupils: Pupils are equal, round, and reactive to light.  Cardiovascular:     Rate and Rhythm: Normal rate and regular rhythm.     Pulses: Normal pulses.  Pulmonary:     Effort: Pulmonary effort is normal. No respiratory distress.     Breath sounds: Normal breath sounds. No wheezing.     Comments: Scattered wheezing, pt appears sob Abdominal:     General: There is no distension.     Palpations: Abdomen is soft. There is no mass.     Tenderness: There is no abdominal tenderness. There is no guarding or rebound.  Genitourinary:    Comments: No gross blood on rectal exam, Hemoccult positive. Musculoskeletal:        General: Normal range of motion.     Cervical back: Normal range of motion and neck supple.     Comments: Wound of the right pinky toe without active drainage, erythema, warmth.  Skin discoloration of the plantar surface of the toe.  Skin:    General: Skin is warm and dry.     Capillary Refill: Capillary refill takes less than 2 seconds.  Neurological:     Mental Status: She is alert and oriented to person, place, and time.     ED Results / Procedures / Treatments   Labs (all labs ordered are listed, but only abnormal results are displayed) Labs Reviewed  COMPREHENSIVE METABOLIC PANEL - Abnormal; Notable for the following components:      Result Value   Sodium 123 (*)    Potassium 2.8 (*)    Chloride 84 (*)    Glucose, Bld 111 (*)    BUN 47 (*)    Creatinine, Ser 3.57 (*)    Calcium 8.7 (*)  GFR, Estimated 13 (*)    All  other components within normal limits  CBC WITH DIFFERENTIAL/PLATELET - Abnormal; Notable for the following components:   RBC 2.66 (*)    Hemoglobin 6.8 (*)    HCT 22.2 (*)    MCH 25.6 (*)    RDW 16.2 (*)    Platelets 453 (*)    All other components within normal limits  CBG MONITORING, ED - Abnormal; Notable for the following components:   Glucose-Capillary 102 (*)    All other components within normal limits  POC OCCULT BLOOD, ED - Abnormal; Notable for the following components:   Fecal Occult Bld POSITIVE (*)    All other components within normal limits  RESP PANEL BY RT-PCR (FLU A&B, COVID) ARPGX2  URINALYSIS, ROUTINE W REFLEX MICROSCOPIC  TYPE AND SCREEN  PREPARE RBC (CROSSMATCH)  ABO/RH    EKG None  Radiology DG Chest Portable 1 View  Result Date: 11/23/2020 CLINICAL DATA:  Weakness, shortness of breath EXAM: PORTABLE CHEST 1 VIEW COMPARISON:  10/21/2020 FINDINGS: Single frontal view of the chest demonstrates an unremarkable cardiac silhouette. No acute airspace disease, effusion, or pneumothorax. No acute bony abnormalities. IMPRESSION: 1. Stable exam, no acute process. Electronically Signed   By: Randa Ngo M.D.   On: 11/23/2020 19:34    Procedures .Critical Care Performed by: Franchot Heidelberg, PA-C Authorized by: Franchot Heidelberg, PA-C   Critical care provider statement:    Critical care time (minutes):  35   Critical care time was exclusive of:  Separately billable procedures and treating other patients and teaching time   Critical care was necessary to treat or prevent imminent or life-threatening deterioration of the following conditions:  CNS failure or compromise and metabolic crisis   Critical care was time spent personally by me on the following activities:  Blood draw for specimens, development of treatment plan with patient or surrogate, discussions with consultants, evaluation of patient's response to treatment, examination of patient, obtaining  history from patient or surrogate, ordering and performing treatments and interventions, ordering and review of laboratory studies, ordering and review of radiographic studies, re-evaluation of patient's condition, review of old charts and pulse oximetry   I assumed direction of critical care for this patient from another provider in my specialty: no   Comments:     Patient with critically low hemoglobin requiring transfusion.  Additionally, hypokalemia and potassium repleted.  She will need hospitalization.   (including critical care time)  Medications Ordered in ED Medications  0.9 %  sodium chloride infusion (Manually program via Guardrails IV Fluids) (has no administration in time range)  potassium chloride 10 mEq in 100 mL IVPB (10 mEq Intravenous New Bag/Given 11/23/20 2002)  pantoprazole (PROTONIX) injection 40 mg (has no administration in time range)  albuterol (VENTOLIN HFA) 108 (90 Base) MCG/ACT inhaler 2 puff (2 puffs Inhalation Given 11/23/20 1920)  potassium chloride SA (KLOR-CON) CR tablet 20 mEq (20 mEq Oral Given 11/23/20 1959)    ED Course  I have reviewed the triage vital signs and the nursing notes.  Pertinent labs & imaging results that were available during my care of the patient were reviewed by me and considered in my medical decision making (see chart for details).    MDM Rules/Calculators/A&P                          Patient presenting for evaluation of weakness.  On exam, patient appears pale, not in acute distress.  She is short of breath with talking, but no respiratory distress.  Concern for symptomatic anemia.  As patient is on a blood thinner, will perform rectal exam to ensure no GI loss.  Additionally, patient with worsening renal function, will recheck in the ED.  Labs show critically low hemoglobin of 6.8, will transfuse.  Additionally, patient with hypokalemia 2.8, potassium ordered.  Creatinine elevated at 3.5, baseline around 2.5.  This will need to  continue to be monitored.  Patient is Hemoccult positive, consider GI source.  Will consult with GI.  As patient does have some shortness of breath, chest x-ray was obtained, reviewed by me, no pneumonia pneumothorax effusion, cardiomegaly.  Due to patient's foot wound, x-ray was ordered.   Discussed with Dr. Abbey Chatters from GI, they will consult.   Discussed with Dr. Olevia Bowens from [facility], pt to be admitted.   Final Clinical Impression(s) / ED Diagnoses Final diagnoses:  Symptomatic anemia  Hypokalemia  Acute renal failure superimposed on chronic kidney disease, unspecified CKD stage, unspecified acute renal failure type [facility])    Rx / DC Orders ED Discharge Orders    None       Jada Kuhnert, PA-C 11/23/20 2030    Franchot Heidelberg, PA-C 11/23/20 2031    Maudie Flakes, MD 11/23/20 2135

## 2020-11-23 NOTE — H&P (Signed)
History and Physical    [name] [name] YOV:[id number] DOB: [birth year] DOA: 11/23/2020  PCP: Andres Shad, MD   Patient coming from: Home.  I have personally briefly reviewed patient's old medical records in [place]  Chief Complaint: Fatigue and weakness.  HPI: [name] [name] is a 74 y.o. female with medical history significant of gout, grade 2 diastolic dysfunction, hyperlipidemia, hypertension, hypokalemia, OSA not on CPAP, paroxysmal atrial fibrillation, history of neurocardiogenic syncope, constipation, GERD, peptic ulcer disease who is referred by her PCP, Dr. Theador Hawthorne, to the emergency department for evaluation of progressively worse weakness and fatigue for the past 5 days.  Her hemoglobin level was found to have a decrease from 9.6 g/dL 33 days ago to 6.8 g/dL today.  The patient is currently being treated for right fifth toe infection with Bactrim twice daily by her PCP.  She denies fever, chills or night sweats.  No rhinorrhea, sore throat, but complains of occasional cough associated with wheezing and dyspnea.  She denies chest pain, palpitations, diaphoresis, PND, orthopnea or recent pitting edema of the lower extremities.  No abdominal pain, nausea, emesis, diarrhea or hematochezia.  She is not 100% sure about having melena recently and she gets occasionally constipated.  She denies dysuria, frequency or hematuria.  No polyuria, polydipsia, polyphagia or blurred vision.  ED Course: Initial vital signs were temperature 98.5 F, pulse seventy-five, respiration eighteen, blood pressure 149/58 mmHg O2 sat 100% on room air.  The patient received two pops of albuterol MDI, 20 mg of KCl orally and was ordered a 1 unit PRBC blood transfusion.  Labwork: Fecal occult blood was positive.  Urinalysis shows small hemoglobinuria, but was otherwise unremarkable.  White count is 9.1, hemoglobin 6.8 g/dL platelets 453.  Sodium 123, potassium 2.8, chloride 84 and CO2 29 mmol/L.  Calcium  normalizes with correction to albumin.  Glucose, BUN 47 and creatinine 3.57 mg/dL.  Her LFTs are within normal range.  Imaging: A portable chest radiograph did not show any acute process.  Cardiac silhouette is normal.  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  There was no definite bony erosive changes seen.  Review of Systems: As per HPI otherwise all other systems reviewed and are negative.  Past Medical History:  Diagnosis Date   Constipation 07/17/2017   GERD (gastroesophageal reflux disease)    Gout    Grade II diastolic dysfunction 74/12/2876   Hyperlipidemia    Hypertension    Hypokalemia    OSA (obstructive sleep apnea) 08/09/2020   PAF (paroxysmal atrial fibrillation) (HCC)    PAF (paroxysmal atrial fibrillation) (HCC)    PUD (peptic ulcer disease)    Syncope    Neurocardiogenic   Past Surgical History:  Procedure Laterality Date   ABDOMINAL HYSTERECTOMY     CATARACT EXTRACTION Bilateral    COLONOSCOPY N/A 10/07/2014   Dr. Oneida Alar: redundant left colon, moderate sized external hemorrhoids    COLONOSCOPY N/A 03/11/2020   Procedure: COLONOSCOPY;  Surgeon: Danie Binder, MD;  Location: [facility];  Service: Endoscopy;  Laterality: N/A;  9:30am w/ overtube   LIPOMA RESECTION     POLYPECTOMY  03/11/2020   Procedure: POLYPECTOMY;  Surgeon: Danie Binder, MD;  Location: [facility];  Service: Endoscopy;;  cecal, transverse, descending, sigmoid   Social History  reports that she quit smoking about 23 years ago. Her smoking use included cigarettes. She started smoking about  56 years ago. She has a 20.00 pack-year smoking history. She has never used smokeless tobacco. She reports that she does not drink alcohol and does not use drugs.  Allergies  Allergen Reactions   Acetaminophen-Codeine Hives   Allopurinol Hives   Amlodipine     Felt she retained fluid  in her chest    Amoxicillin Hives    Did it involve swelling of the face/tongue/throat, SOB, or low BP? No Did it involve sudden or severe rash/hives, skin peeling, or any reaction on the inside of your mouth or nose? No Did you need to seek medical attention at a hospital or doctor's office? No When did it last happen?10 + years If all above answers are "NO", may proceed with cephalosporin use.    Doxycycline     " sick and weak"   Tramadol Hives   Family History  Problem Relation Age of Onset   Stroke Mother    Dementia Father    Cancer - Other Sister    Atrial fibrillation Sister    Heart failure Sister    Prior to Admission medications   Medication Sig Start Date End Date Taking? Authorizing Provider  acetaminophen (TYLENOL) 500 MG tablet Take 1,000 mg by mouth every 6 (six) hours as needed for moderate pain.    [provider]  albuterol (VENTOLIN HFA) 108 (90 Base) MCG/ACT inhaler Inhale 1-2 puffs into the lungs every 6 (six) hours as needed for wheezing or shortness of breath.  09/23/20   [provider]  apixaban (ELIQUIS) 5 MG TABS tablet Take 1 tablet (5 mg total) by mouth 2 (two) times daily. 07/25/20 07/20/21  Satira Sark, MD  APPLE CIDER VINEGAR PO Take 2 capsules by mouth daily.    [provider]  cetirizine (ZYRTEC) 10 MG tablet Take 10 mg by mouth daily as needed for allergies.    [provider]  Cholecalciferol (VITAMIN D-3) 25 MCG (1000 UT) CAPS Take 1 capsule by mouth daily.    [provider]  colchicine 0.6 MG tablet Take 0.6 mg by mouth daily. 01/31/20   [provider]  cyclobenzaprine (FLEXERIL) 5 MG tablet Take 5 mg by mouth 3 (three) times daily as needed for muscle spasms.  06/19/20   [provider]  cyproheptadine (PERIACTIN) 4 MG tablet Take 4 mg by mouth 3 (three) times daily as needed for allergies.    [provider]  diclofenac Sodium (VOLTAREN) 1 % GEL Apply 1  application topically 4 (four) times daily as needed (pain).    [provider]  doxycycline (VIBRAMYCIN) 100 MG capsule Take 1 capsule (100 mg total) by mouth 2 (two) times daily. 11/18/20   Triplett, Tammy, PA-C  ELDERBERRY PO Take 2 capsules by mouth daily.     [provider]  ferrous sulfate 325 (65 FE) MG tablet Take 325 mg by mouth daily with breakfast.    [provider]  flecainide (TAMBOCOR) 50 MG tablet Take 1 tablet (50 mg total) by mouth 2 (two) times daily. 10/25/20 11/24/20  Darliss Cheney, MD  fluticasone (FLONASE) 50 MCG/ACT nasal spray Place 1 spray into both nostrils daily as needed for allergies or rhinitis.    [provider]  Fluticasone-Salmeterol (ADVAIR) 500-50 MCG/DOSE AEPB Inhale 1 puff into the lungs every 12 (twelve) hours.    [provider]  furosemide (LASIX) 40 MG tablet Take 1 tablet (40 mg total) by mouth daily. 10/27/20 01/25/21  Darliss Cheney, MD  hydrALAZINE (APRESOLINE)  50 MG tablet Take 1 tablet (50 mg total) by mouth 3 (three) times daily. 10/25/20 11/24/20  Darliss Cheney, MD  HYDROcodone-acetaminophen (NORCO/VICODIN) 5-325 MG tablet Take one tab po q 4 hrs prn pain 11/18/20   Triplett, Tammy, PA-C  losartan (COZAAR) 25 MG tablet Take 0.5 tablets (12.5 mg total) by mouth daily. 10/27/20   Darliss Cheney, MD  lubiprostone (AMITIZA) 24 MCG capsule Take 1 capsule (24 mcg total) by mouth in the morning and at bedtime. 09/15/20 09/15/21  Eloise Harman, DO  meclizine (ANTIVERT) 25 MG tablet Take 25 mg by mouth 3 (three) times daily as needed for dizziness.     [provider]  omeprazole (PRILOSEC) 40 MG capsule Take 40 mg by mouth daily.      [provider]  potassium chloride SA (KLOR-CON) 20 MEQ tablet Take 20 mEq by mouth in the morning, at noon, and at bedtime.    [provider]  simvastatin (ZOCOR) 40 MG tablet Take 40 mg by mouth daily in the afternoon.     [provider]   triamterene-hydrochlorothiazide (MAXZIDE) 75-50 MG tablet Take 1 tablet by mouth daily. 10/27/20   Darliss Cheney, MD  vitamin C (ASCORBIC ACID) 250 MG tablet Take 500 mg by mouth daily.    [provider]   Physical Exam: Vitals:   11/23/20 1635 11/23/20 1907 11/23/20 1930 11/23/20 2000  BP:  (!) 125/48 (!) 129/46 (!) 130/57  Pulse:  70 69 81  Resp:  18 19 12   Temp:      TempSrc:      SpO2:  100% 100% 96%  Weight: 68.5 kg     Height: 5\' 1"  (1.549 m)      Constitutional: Chronically ill. Eyes: PERRL, lids and conjunctivae are pale. ENMT: Mucous membranes are moist. Posterior pharynx clear of any exudate or lesions. Neck: normal, supple, no masses, no thyromegaly Respiratory: clear to auscultation bilaterally, no wheezing, no crackles. Normal respiratory effort. No accessory muscle use.  Cardiovascular: Regular rate and rhythm, no murmurs / rubs / gallops. No extremity edema. 2+ pedal pulses. No carotid bruits.  Abdomen: Nondistended.  Bowel sounds positive.  Soft, no tenderness, no masses palpated. No hepatosplenomegaly. Musculoskeletal: Generalized weakness.  No clubbing / cyanosis. Good ROM, no contractures. Normal muscle tone.  Skin: Right fifth toe wound without any discharge, calor or significant erythema/edema. Neurologic: CN 2-12 grossly intact. Sensation intact, DTR normal. Strength 4/5 in all 4.  Psychiatric: Normal judgment and insight. Alert and oriented x 3. Normal mood.   Labs on Admission: I have personally reviewed following labs and imaging studies  CBC: Recent Labs  Lab 11/18/20 0808 11/23/20 1900  WBC 15.1* 9.1  NEUTROABS 8.8* 4.8  HGB 7.1* 6.8*  HCT 23.6* 22.2*  MCV 86.8 83.5  PLT 598* 453*    Basic Metabolic Panel: Recent Labs  Lab 11/18/20 0808 11/23/20 1900  NA 128* 123*  K 2.9* 2.8*  CL 84* 84*  CO2 30 29  GLUCOSE 93 111*  BUN 64* 47*  CREATININE 2.85* 3.57*  CALCIUM 9.2 8.7*    GFR: Estimated Creatinine Clearance: 12.2 mL/min  (A) (by C-G formula based on SCr of 3.57 mg/dL (H)).  Liver Function Tests: Recent Labs  Lab 11/23/20 1900  AST 15  ALT 11  ALKPHOS 50  BILITOT 0.3  PROT 6.7  ALBUMIN 3.7    Urine analysis: No results found for: COLORURINE, [place], [place], [place], [place], [place], [place], [place], [place], [place], [place], [place]  Exams on Admission: DG Chest Portable 1 View  Result Date: 11/23/2020 CLINICAL DATA:  Weakness, shortness of breath EXAM: PORTABLE CHEST 1 VIEW COMPARISON:  10/21/2020 FINDINGS: Single frontal view of the chest demonstrates an unremarkable cardiac silhouette. No acute airspace disease, effusion, or pneumothorax. No acute bony abnormalities. IMPRESSION: 1. Stable exam, no acute process. Electronically Signed   By: Randa Ngo M.D.   On: 11/23/2020 19:34   DG Foot Complete Right  Result Date: 11/23/2020 CLINICAL DATA:  Fifth toe infection EXAM: RIGHT FOOT COMPLETE - 3+ VIEW COMPARISON:  11/18/2020 FINDINGS: No definitive fracture is seen. Stable calcifications are noted along the fifth toe laterally similar to that seen on the prior exam. No definitive erosive changes are seen. Tarsal degenerative changes and calcaneal spurring is seen. No soft tissue abnormality is noted. 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 No definitive bony erosive changes are noted. Electronically Signed   By: Inez Catalina M.D.   On: 11/23/2020 20:30   05/05/2020 echocardiogram  IMPRESSIONS: 1. Left ventricular ejection fraction, by estimation, is 65 to 70%. The  left ventricle has normal function. The left ventricle has no regional  wall motion abnormalities. There is moderate left ventricular hypertrophy  of the posterior segment. Left  ventricular diastolic parameters are consistent with Grade II diastolic  dysfunction (pseudonormalization). Elevated left atrial  pressure.  2. Right ventricular systolic function is normal. The right ventricular  size is normal. There is normal pulmonary artery systolic pressure.  3. Left atrial size was moderately dilated.  4. Nodular calcification on posterior mitral leaflet measuring 1.56 cm x  1.65 cm. This may represent a calcified vegetation. The mitral valve is  degenerative. Mild mitral valve regurgitation. Moderate annular  calcification. Chordal calcification also  noted.  5. The aortic valve is tricuspid. Aortic valve regurgitation is not  visualized. Mild aortic valve sclerosis is present, with no evidence of  aortic valve stenosis.  6. The inferior vena cava is normal in size with greater than 50%  respiratory variability, suggesting right atrial pressure of 3 mmHg.  EKG: Independently reviewed.  Vent. rate 70 BPM PR interval * ms QRS duration 132 ms QT/QTc 514/555 ms P-R-T axes 52 0 80 Sinus rhythm IVCD, consider atypical LBBB  Assessment/Plan Principal Problem:   Symptomatic anemia Observation/telemetry. Continue PRBC transfusion. Monitor hematocrit and hemoglobin. Clear liquid diet now. N.p.o. after midnight. GI consult in the morning.    Microcytic anemia As above. Hold iron supplement pending GI evaluation.  Active Problems:   AKI (acute kidney injury) ([place]) Hold diuretics. Hold ARB. Avoid nephrotoxic medications.  Monitor intake and output. Monitor renal function electrolytes.    Hypokalemia Oral replacement given. Will be receiving blood transfusion. Check magnesium level. Follow-up potassium level in a.m.    Hyponatremia Hold furosemide. Follow-up sodium level.    Paroxysmal atrial fibrillation (HCC) CHA?DS?-VASc Score of at least five. (Age, gender, diastolic CHF, HTN, aortic calcification) Continue Tambocor 50 mg p.o. twice daily. Hold Eliquis due to acute blood loss anemia.    Grade II diastolic dysfunction No signs of decompensation at this  time. Diuretics and ARB on hold due to AKI.    Near syncope In the setting of hypovolemia. See nurses notes for further detail. Holding antihypertensives. Transfusing.    Essential hypertension, benign Hold antihypertensives. Monitor blood pressure.    GERD (gastroesophageal reflux disease) On pantoprazole 40 mg IVPB twice daily.    Hyperlipidemia Not taking her  simvastatin. Will not restart while in the hospital. Follow-up with PCP.    Gout Not taking colchicine at this time. Follow-up with PCP.    OSA (obstructive sleep apnea) Not on CPAP.    DVT prophylaxis: SCDs. Code Status:   Full code. Family Communication: Disposition Plan:   Patient is from:  Home.  Anticipated DC to:  Home.  Anticipated DC date:  11/24/2020 or 11/25/2020.  Anticipated DC barriers: Clinical status and GI evaluation. Consults called:  Routine a.m. gastroenterology consult. Admission status:  Observation/telemetry.  Severity of Illness:  High due to symptomatic anemia secondary to gastrointestinal bleeding requiring transfusion of PRBC and will be seen by gastroenterology in the morning.  Reubin Milan MD [facility]  How to contact the [facility] Attending or Consulting provider [place] or covering provider during after hours [place], for this patient?   1. Check the care team in [facility] and look for a) attending/consulting [facility] provider listed and b) the [facility] team listed 2. Log into [email or web address] and use [place]'s universal password to access. If you do not have the password, please contact the hospital operator. 3. Locate the [facility] provider you are looking for under [facility] and page to a number that you can be directly reached. 4. If you still have difficulty reaching the provider, please page the [facility] (Director on Call) for the Hospitalists listed on amion for assistance.  11/23/2020, 9:02 PM   This document was prepared using Dragon voice recognition software and may contain  some unintended transcription errors.

## 2020-11-24 ENCOUNTER — Encounter (HOSPITAL_COMMUNITY): Payer: Self-pay | Admitting: Internal Medicine

## 2020-11-24 DIAGNOSIS — D649 Anemia, unspecified: Secondary | ICD-10-CM

## 2020-11-24 DIAGNOSIS — K5900 Constipation, unspecified: Secondary | ICD-10-CM | POA: Diagnosis present

## 2020-11-24 DIAGNOSIS — D500 Iron deficiency anemia secondary to blood loss (chronic): Secondary | ICD-10-CM

## 2020-11-24 DIAGNOSIS — D62 Acute posthemorrhagic anemia: Secondary | ICD-10-CM | POA: Diagnosis present

## 2020-11-24 DIAGNOSIS — K219 Gastro-esophageal reflux disease without esophagitis: Secondary | ICD-10-CM | POA: Diagnosis present

## 2020-11-24 DIAGNOSIS — Z9842 Cataract extraction status, left eye: Secondary | ICD-10-CM | POA: Diagnosis not present

## 2020-11-24 DIAGNOSIS — Z8711 Personal history of peptic ulcer disease: Secondary | ICD-10-CM | POA: Diagnosis not present

## 2020-11-24 DIAGNOSIS — K5521 Angiodysplasia of colon with hemorrhage: Secondary | ICD-10-CM | POA: Diagnosis present

## 2020-11-24 DIAGNOSIS — K31819 Angiodysplasia of stomach and duodenum without bleeding: Secondary | ICD-10-CM | POA: Diagnosis not present

## 2020-11-24 DIAGNOSIS — R195 Other fecal abnormalities: Secondary | ICD-10-CM | POA: Insufficient documentation

## 2020-11-24 DIAGNOSIS — E871 Hypo-osmolality and hyponatremia: Secondary | ICD-10-CM | POA: Diagnosis present

## 2020-11-24 DIAGNOSIS — K5520 Angiodysplasia of colon without hemorrhage: Secondary | ICD-10-CM | POA: Diagnosis not present

## 2020-11-24 DIAGNOSIS — Z823 Family history of stroke: Secondary | ICD-10-CM | POA: Diagnosis not present

## 2020-11-24 DIAGNOSIS — I480 Paroxysmal atrial fibrillation: Secondary | ICD-10-CM | POA: Diagnosis present

## 2020-11-24 DIAGNOSIS — Z888 Allergy status to other drugs, medicaments and biological substances status: Secondary | ICD-10-CM | POA: Diagnosis not present

## 2020-11-24 DIAGNOSIS — Z87891 Personal history of nicotine dependence: Secondary | ICD-10-CM | POA: Diagnosis not present

## 2020-11-24 DIAGNOSIS — K921 Melena: Secondary | ICD-10-CM | POA: Diagnosis not present

## 2020-11-24 DIAGNOSIS — Z90710 Acquired absence of both cervix and uterus: Secondary | ICD-10-CM | POA: Diagnosis not present

## 2020-11-24 DIAGNOSIS — E876 Hypokalemia: Secondary | ICD-10-CM | POA: Diagnosis present

## 2020-11-24 DIAGNOSIS — N179 Acute kidney failure, unspecified: Secondary | ICD-10-CM | POA: Diagnosis present

## 2020-11-24 DIAGNOSIS — I130 Hypertensive heart and chronic kidney disease with heart failure and stage 1 through stage 4 chronic kidney disease, or unspecified chronic kidney disease: Secondary | ICD-10-CM | POA: Diagnosis present

## 2020-11-24 DIAGNOSIS — K31811 Angiodysplasia of stomach and duodenum with bleeding: Secondary | ICD-10-CM | POA: Diagnosis present

## 2020-11-24 DIAGNOSIS — Z9841 Cataract extraction status, right eye: Secondary | ICD-10-CM | POA: Diagnosis not present

## 2020-11-24 DIAGNOSIS — I5032 Chronic diastolic (congestive) heart failure: Secondary | ICD-10-CM | POA: Diagnosis present

## 2020-11-24 DIAGNOSIS — E785 Hyperlipidemia, unspecified: Secondary | ICD-10-CM | POA: Diagnosis present

## 2020-11-24 DIAGNOSIS — I890 Lymphedema, not elsewhere classified: Secondary | ICD-10-CM | POA: Diagnosis not present

## 2020-11-24 DIAGNOSIS — Z20822 Contact with and (suspected) exposure to covid-19: Secondary | ICD-10-CM | POA: Diagnosis present

## 2020-11-24 DIAGNOSIS — Z880 Allergy status to penicillin: Secondary | ICD-10-CM | POA: Diagnosis not present

## 2020-11-24 DIAGNOSIS — N184 Chronic kidney disease, stage 4 (severe): Secondary | ICD-10-CM | POA: Diagnosis present

## 2020-11-24 DIAGNOSIS — D509 Iron deficiency anemia, unspecified: Secondary | ICD-10-CM | POA: Diagnosis not present

## 2020-11-24 DIAGNOSIS — M109 Gout, unspecified: Secondary | ICD-10-CM | POA: Diagnosis present

## 2020-11-24 DIAGNOSIS — G4733 Obstructive sleep apnea (adult) (pediatric): Secondary | ICD-10-CM | POA: Diagnosis present

## 2020-11-24 LAB — BASIC METABOLIC PANEL
Anion gap: 11 (ref 5–15)
BUN: 39 mg/dL — ABNORMAL HIGH (ref 8–23)
CO2: 25 mmol/L (ref 22–32)
Calcium: 8.2 mg/dL — ABNORMAL LOW (ref 8.9–10.3)
Chloride: 90 mmol/L — ABNORMAL LOW (ref 98–111)
Creatinine, Ser: 3.24 mg/dL — ABNORMAL HIGH (ref 0.44–1.00)
GFR, Estimated: 14 mL/min — ABNORMAL LOW (ref >60)
Glucose, Bld: 87 mg/dL (ref 70–99)
Potassium: 3.4 mmol/L — ABNORMAL LOW (ref 3.5–5.1)
Sodium: 126 mmol/L — ABNORMAL LOW (ref 135–145)

## 2020-11-24 LAB — TYPE AND SCREEN
ABO/RH(D): O POS
Antibody Screen: NEGATIVE
Unit division: 0

## 2020-11-24 LAB — CBC
HCT: 24.3 % — ABNORMAL LOW (ref 36.0–46.0)
Hemoglobin: 7.5 g/dL — ABNORMAL LOW (ref 12.0–15.0)
MCH: 26.3 pg (ref 26.0–34.0)
MCHC: 30.9 g/dL (ref 30.0–36.0)
MCV: 85.3 fL (ref 80.0–100.0)
Platelets: 401 10*3/uL — ABNORMAL HIGH (ref 150–400)
RBC: 2.85 MIL/uL — ABNORMAL LOW (ref 3.87–5.11)
RDW: 15.9 % — ABNORMAL HIGH (ref 11.5–15.5)
WBC: 9.7 10*3/uL (ref 4.0–10.5)
nRBC: 0 % (ref 0.0–0.2)

## 2020-11-24 LAB — BPAM RBC
Blood Product Expiration Date: 202201092359
ISSUE DATE / TIME: 202112012113
Unit Type and Rh: 5100

## 2020-11-24 LAB — PROTIME-INR
INR: 1.4 — ABNORMAL HIGH (ref 0.8–1.2)
Prothrombin Time: 16.8 seconds — ABNORMAL HIGH (ref 11.4–15.2)

## 2020-11-24 MED ORDER — SODIUM CHLORIDE 0.9 % IV SOLN: INTRAVENOUS | Status: AC

## 2020-11-24 MED ORDER — POTASSIUM CHLORIDE CRYS ER 20 MEQ PO TBCR
40.0000 meq | EXTENDED_RELEASE_TABLET | Freq: Once | ORAL | Status: AC
  Administered 2020-11-24: 40 meq via ORAL
  Filled 2020-11-24: qty 2

## 2020-11-24 MED ORDER — ACETAMINOPHEN 325 MG PO TABS
650.0000 mg | ORAL_TABLET | Freq: Four times a day (QID) | ORAL | Status: DC | PRN
  Administered 2020-11-26: 650 mg via ORAL
  Filled 2020-11-24: qty 2

## 2020-11-24 NOTE — H&P (View-Only) (Signed)
Referring Provider: Rodena Goldmann, DO Primary Care Physician:  Andres Shad, MD Primary Gastroenterologist:  Elon Alas. Abbey Chatters, DO  Reason for Consultation:  Symptomatic anemia, heme positive stool  HPI: [name] [name] is a 74 y.o. female with past medical history for A. fib on Eliquis, hypertension, grade 2 diastolic dysfunction, active right fifth toe infection presented to the hospital with complaints of fatigue, dizziness, lightheadedness.  Seen by PCP who obtained labs, hemoglobin 7 and advised to come to ED.  Patient denies melena or rectal bleeding.  Bowel movements have been regular.  Stools have been dark brown on iron.  No upper GI symptoms.  No abdominal pain.  She takes Eliquis twice daily for A. fib, last dose on December 1 at 10 AM.  Denies aspirin.  She takes PPI daily.  In the ED, no gross blood on rectal exam, stool heme positive.  Hemoglobin 6.8, hematocrit 22.2, potassium 2.8, BUN 47, creatinine 3.5 (baseline appears to be around 2.5), sodium 123.  Received 1 unit of packed red blood cells overnight.  Hemoglobin this morning 7.5.  Sodium up to 126, potassium up to 3.4, creatinine down to 3.24, BUN down to 39.  INR 1.4.  Back in June her hemoglobin was 12.1.  In September hemoglobin 9.5.  Hemoglobin remained stable up until November 26 she was noted to have a hemoglobin of 7.1.   Patient had colonoscopy March 2021.  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  Nodular mucosa at the anus, biopsied with benign findings.  No prior upper endoscopy although she states years ago she was told she had a small peptic ulcer.  Prior to Admission medications   Medication Sig Start Date End Date Taking? Authorizing Provider  acetaminophen (TYLENOL) 500 MG tablet Take 1,000 mg by mouth every 6 (six) hours as needed for moderate pain.   Yes [provider]  albuterol (VENTOLIN HFA) 108 (90 Base) MCG/ACT inhaler Inhale 1-2 puffs into the lungs every 6 (six)  hours as needed for wheezing or shortness of breath.  09/23/20  Yes [provider]  apixaban (ELIQUIS) 5 MG TABS tablet Take 1 tablet (5 mg total) by mouth 2 (two) times daily. 07/25/20 07/20/21 Yes Satira Sark, MD  cetirizine (ZYRTEC) 10 MG tablet Take 10 mg by mouth daily as needed for allergies.   Yes [provider]  Cholecalciferol (VITAMIN D-3) 25 MCG (1000 UT) CAPS Take 1 capsule by mouth daily.   Yes [provider]  diclofenac Sodium (VOLTAREN) 1 % GEL Apply 1 application topically 4 (four) times daily as needed (pain).   Yes [provider]  ELDERBERRY PO Take 2 capsules by mouth daily.    Yes [provider]  ferrous sulfate 325 (65 FE) MG tablet Take 325 mg by mouth daily with breakfast.   Yes [provider]  flecainide (TAMBOCOR) 50 MG tablet Take 1 tablet (50 mg total) by mouth 2 (two) times daily. 10/25/20 11/24/20 Yes Pahwani, Einar Grad, MD  fluticasone (FLONASE) 50 MCG/ACT nasal spray Place 1 spray into both nostrils daily as needed for allergies or rhinitis.   Yes [provider]  furosemide (LASIX) 40 MG tablet Take 1 tablet (40 mg total) by mouth daily. 10/27/20 01/25/21 Yes Darliss Cheney, MD  gabapentin (NEURONTIN) 300 MG capsule Take 300 mg by mouth 3 (three) times daily. 11/21/20  Yes [provider]  losartan (COZAAR) 25 MG tablet Take 0.5 tablets (12.5 mg total) by mouth daily. 10/27/20  Yes  Darliss Cheney, MD  lubiprostone (AMITIZA) 24 MCG capsule Take 1 capsule (24 mcg total) by mouth in the morning and at bedtime. 09/15/20 09/15/21 Yes Carver, Elon Alas, DO  meclizine (ANTIVERT) 25 MG tablet Take 25 mg by mouth 3 (three) times daily as needed for dizziness.    Yes [provider]  omeprazole (PRILOSEC) 40 MG capsule Take 40 mg by mouth daily.     Yes [provider]  potassium chloride SA (KLOR-CON) 20 MEQ tablet Take 20 mEq by mouth in the morning, at noon, and at bedtime.   Yes [provider]  sulfamethoxazole-trimethoprim (BACTRIM DS) 800-160 MG tablet Take 1 tablet by mouth 2 (two) times daily. 11/21/20  Yes [provider]  vitamin C (ASCORBIC ACID) 250 MG tablet Take 500 mg by mouth daily.   Yes [provider]  APPLE CIDER VINEGAR PO Take 2 capsules by mouth daily. Patient not taking: Reported on 11/23/2020    [provider]  colchicine 0.6 MG tablet Take 0.6 mg by mouth daily. Patient not taking: Reported on 11/23/2020 01/31/20   [provider]  cyclobenzaprine (FLEXERIL) 5 MG tablet Take 5 mg by mouth 3 (three) times daily as needed for muscle spasms.  Patient not taking: Reported on 11/23/2020 06/19/20   [provider]  cyproheptadine (PERIACTIN) 4 MG tablet Take 4 mg by mouth 3 (three) times daily as needed for allergies. Patient not taking: Reported on 11/23/2020    [provider]  Fluticasone-Salmeterol (ADVAIR) 500-50 MCG/DOSE AEPB Inhale 1 puff into the lungs every 12 (twelve) hours. Patient not taking: Reported on 11/23/2020    [provider]  hydrALAZINE (APRESOLINE) 50 MG tablet Take 1 tablet (50 mg total) by mouth 3 (three) times daily. Patient not taking: Reported on 11/23/2020 10/25/20 11/24/20  Darliss Cheney, MD  simvastatin (ZOCOR) 40 MG tablet Take 40 mg by mouth daily in the afternoon.  Patient not taking: Reported on 11/23/2020    [provider]    Current Facility-Administered Medications  Medication Dose Route Frequency Provider Last Rate Last Admin  . albuterol (VENTOLIN HFA) 108 (90 Base) MCG/ACT inhaler 1-2 puff  1-2 puff Inhalation Q6H PRN Reubin Milan, MD      . flecainide [facility]) tablet 50 mg  50 mg Oral BID Reubin Milan, MD   50 mg at 11/23/20 2228  . gabapentin (NEURONTIN) capsule 300 mg  300 mg Oral TID Reubin Milan, MD   300 mg at 11/23/20 2210  . lubiprostone (AMITIZA) capsule 24 mcg  24 mcg Oral BID Reubin Milan, MD   24 mcg at  11/23/20 2228  . ondansetron (ZOFRAN) tablet 4 mg  4 mg Oral Q6H PRN Reubin Milan, MD       Or  . ondansetron [facility]) injection 4 mg  4 mg Intravenous Q6H PRN Reubin Milan, MD      . pantoprazole (PROTONIX) injection 40 mg  40 mg Intravenous Q12H Reubin Milan, MD   40 mg at 11/23/20 2125  . sulfamethoxazole-trimethoprim (BACTRIM DS) 800-160 MG per tablet 1 tablet  1 tablet Oral BID Reubin Milan, MD   1 tablet at 11/23/20 2210   Current Outpatient Medications  Medication Sig Dispense Refill  . acetaminophen (TYLENOL) 500 MG tablet Take 1,000 mg by mouth every 6 (six) hours as needed for moderate pain.    Marland Kitchen albuterol (VENTOLIN HFA) 108 (90 Base) MCG/ACT inhaler Inhale 1-2 puffs into the lungs every 6 (  six) hours as needed for wheezing or shortness of breath.     Marland Kitchen apixaban (ELIQUIS) 5 MG TABS tablet Take 1 tablet (5 mg total) by mouth 2 (two) times daily. 180 tablet 3  . cetirizine (ZYRTEC) 10 MG tablet Take 10 mg by mouth daily as needed for allergies.    . Cholecalciferol (VITAMIN D-3) 25 MCG (1000 UT) CAPS Take 1 capsule by mouth daily.    . diclofenac Sodium (VOLTAREN) 1 % GEL Apply 1 application topically 4 (four) times daily as needed (pain).    Marland Kitchen ELDERBERRY PO Take 2 capsules by mouth daily.     . ferrous sulfate 325 (65 FE) MG tablet Take 325 mg by mouth daily with breakfast.    . flecainide (TAMBOCOR) 50 MG tablet Take 1 tablet (50 mg total) by mouth 2 (two) times daily. 60 tablet 0  . fluticasone (FLONASE) 50 MCG/ACT nasal spray Place 1 spray into both nostrils daily as needed for allergies or rhinitis.    . furosemide (LASIX) 40 MG tablet Take 1 tablet (40 mg total) by mouth daily. 90 tablet 3  . gabapentin (NEURONTIN) 300 MG capsule Take 300 mg by mouth 3 (three) times daily.    Marland Kitchen losartan (COZAAR) 25 MG tablet Take 0.5 tablets (12.5 mg total) by mouth daily.    Marland Kitchen lubiprostone (AMITIZA) 24 MCG capsule Take 1 capsule (24 mcg total) by mouth in the morning  and at bedtime. 180 capsule 3  . meclizine (ANTIVERT) 25 MG tablet Take 25 mg by mouth 3 (three) times daily as needed for dizziness.     Marland Kitchen omeprazole (PRILOSEC) 40 MG capsule Take 40 mg by mouth daily.      . potassium chloride SA (KLOR-CON) 20 MEQ tablet Take 20 mEq by mouth in the morning, at noon, and at bedtime.    . sulfamethoxazole-trimethoprim (BACTRIM DS) 800-160 MG tablet Take 1 tablet by mouth 2 (two) times daily.    . vitamin C (ASCORBIC ACID) 250 MG tablet Take 500 mg by mouth daily.    . APPLE CIDER VINEGAR PO Take 2 capsules by mouth daily. (Patient not taking: Reported on 11/23/2020)    . colchicine 0.6 MG tablet Take 0.6 mg by mouth daily. (Patient not taking: Reported on 11/23/2020)    . cyclobenzaprine (FLEXERIL) 5 MG tablet Take 5 mg by mouth 3 (three) times daily as needed for muscle spasms.  (Patient not taking: Reported on 11/23/2020)    . cyproheptadine (PERIACTIN) 4 MG tablet Take 4 mg by mouth 3 (three) times daily as needed for allergies. (Patient not taking: Reported on 11/23/2020)    . Fluticasone-Salmeterol (ADVAIR) 500-50 MCG/DOSE AEPB Inhale 1 puff into the lungs every 12 (twelve) hours. (Patient not taking: Reported on 11/23/2020)    . hydrALAZINE (APRESOLINE) 50 MG tablet Take 1 tablet (50 mg total) by mouth 3 (three) times daily. (Patient not taking: Reported on 11/23/2020) 90 tablet 0  . simvastatin (ZOCOR) 40 MG tablet Take 40 mg by mouth daily in the afternoon.  (Patient not taking: Reported on 11/23/2020)      Allergies as of 11/23/2020 - Review Complete 11/23/2020  Allergen Reaction Noted  . Acetaminophen-codeine Hives   . Allopurinol Hives   . Amlodipine  08/03/2020  . Amoxicillin Hives 03/04/2020  . Doxycycline  11/23/2020  . Tramadol Hives     Past Medical History:  Diagnosis Date  . Constipation 07/17/2017  . GERD (gastroesophageal reflux disease)   . Gout   . Grade II  diastolic dysfunction 54/01/7061  . Hyperlipidemia   . Hypertension   .  Hypokalemia   . OSA (obstructive sleep apnea) 08/09/2020  . PAF (paroxysmal atrial fibrillation) ([place])   . PAF (paroxysmal atrial fibrillation) ([place])   . PUD (peptic ulcer disease)   . Syncope    Neurocardiogenic    Past Surgical History:  Procedure Laterality Date  . ABDOMINAL HYSTERECTOMY    . CATARACT EXTRACTION Bilateral   . COLONOSCOPY N/A 10/07/2014   Dr. Oneida Alar: redundant left colon, moderate sized external hemorrhoids   . COLONOSCOPY N/A 03/11/2020   Procedure: COLONOSCOPY;  Surgeon: Danie Binder, MD;  Location: [facility];  Service: Endoscopy;  Laterality: N/A;  9:30am w/ overtube  . LIPOMA RESECTION    . POLYPECTOMY  03/11/2020   Procedure: POLYPECTOMY;  Surgeon: Danie Binder, MD;  Location: [facility];  Service: Endoscopy;;  cecal, transverse, descending, sigmoid    Family History  Problem Relation Age of Onset  . Stroke Mother   . Dementia Father   . Cancer - Other Sister   . Atrial fibrillation Sister   . Heart failure Sister     Social History   Socioeconomic History  . Marital status: Single    Spouse name: Not on file  . Number of children: Not on file  . Years of education: Not on file  . Highest education level: Not on file  Occupational History  . Occupation: Retired  Tobacco Use  . Smoking status: Former Smoker    Packs/day: 1.00    Years: 20.00    Pack years: 20.00    Types: Cigarettes    Start date: 03/16/1964    Quit date: 10/07/1997    Years since quitting: 23.1  . Smokeless tobacco: Never Used  Vaping Use  . Vaping Use: Never used  Substance and Sexual Activity  . Alcohol use: No    Alcohol/week: 0.0 standard drinks  . Drug use: No  . Sexual activity: Yes    Partners: Male  Other Topics Concern  . Not on file  Social History Narrative  . Not on file   Social Determinants of Health   Financial Resource Strain:   . Difficulty of Paying Living Expenses: Not on file  Food Insecurity:   . Worried About Sales executive in the Last Year: Not on file  . Ran Out of Food in the Last Year: Not on file  Transportation Needs:   . Lack of Transportation (Medical): Not on file  . Lack of Transportation (Non-Medical): Not on file  Physical Activity:   . Days of Exercise per Week: Not on file  . Minutes of Exercise per Session: Not on file  Stress:   . Feeling of Stress : Not on file  Social Connections:   . Frequency of Communication with Friends and Family: Not on file  . Frequency of Social Gatherings with Friends and Family: Not on file  . Attends Religious Services: Not on file  . Active Member of Clubs or Organizations: Not on file  . Attends Archivist Meetings: Not on file  . Marital Status: Not on file  Intimate Partner Violence:   . Fear of Current or Ex-Partner: Not on file  . Emotionally Abused: Not on file  . Physically Abused: Not on file  . Sexually Abused: Not on file     ROS:  General: Negative for anorexia, weight loss, fever, chills.  See HPI. Eyes: Negative for vision changes.  ENT: Negative for hoarseness, difficulty swallowing , nasal congestion. CV: Negative for chest pain, angina, palpitations, dyspnea on exertion, peripheral edema.  Respiratory: Negative for dyspnea at rest, dyspnea on exertion, cough, sputum, wheezing.  GI: See history of present illness. GU:  Negative for dysuria, hematuria, urinary incontinence, urinary frequency, nocturnal urination.  MS: Negative for low back pain.  Right fifth toe pain Derm: Negative for rash or itching.  Neuro: Negative for weakness, abnormal sensation, seizure, frequent headaches, memory loss, confusion.  Psych: Negative for anxiety, depression, suicidal ideation, hallucinations.  Endo: Negative for unusual weight change.  Heme: Negative for bruising or bleeding. Allergy: Negative for rash or hives.       Physical Examination: Vital signs in last 24 hours: Temp:  [98.4 F (36.9 C)-98.6 F (37 C)] 98.6 F (37 C)  (12/01 2315) Pulse Rate:  [65-84] 72 (12/02 0730) Resp:  [12-22] 20 (12/02 0730) BP: (88-152)/(42-73) 136/55 (12/02 0730) SpO2:  [96 %-100 %] 100 % (12/02 0730) Weight:  [68.5 kg] 68.5 kg (12/01 1635)    General: Well-nourished, well-developed in no acute distress.  Sister at bedside Head: Normocephalic, atraumatic.   Eyes: Conjunctiva pink, no icterus. Mouth: Oropharyngeal mucosa moist and pink , no lesions erythema or exudate. Neck: Supple without thyromegaly, masses, or lymphadenopathy.  Lungs: Clear to auscultation bilaterally.  Heart: Regular rate and rhythm, no murmurs rubs or gallops.  Abdomen: Bowel sounds are normal, nontender, nondistended, no hepatosplenomegaly or masses, no abdominal bruits or    hernia , no rebound or guarding.   Rectal: Not performed Extremities: No lower extremity edema, clubbing, deformity.  Right fifth toe with dressing Neuro: Alert and oriented x 4 , grossly normal neurologically.  Skin: Warm and dry, no rash or jaundice.   Psych: Alert and cooperative, normal mood and affect.        Intake/Output from previous day: 12/01 0701 - 12/02 0700 In: 515 [Blood:315; IV Piggyback:200] Out: -  Intake/Output this shift: Total I/O In: 1177.1 [I.V.:1177.1] Out: -   Lab Results: CBC Recent Labs    11/23/20 1900 11/24/20 0417  WBC 9.1 9.7  HGB 6.8* 7.5*  HCT 22.2* 24.3*  MCV 83.5 85.3  PLT 453* 401*   Lab Results  Component Value Date   IRON 36 10/21/2020   TIBC 480 (H) 10/21/2020   FERRITIN 42 10/21/2020   Lab Results  Component Value Date   FOLATE 17.1 10/21/2020   Lab Results  Component Value Date   VITAMINB12 1,242 (H) 10/21/2020     BMET Recent Labs    11/23/20 1900 11/24/20 0417  NA 123* 126*  K 2.8* 3.4*  CL 84* 90*  CO2 29 25  GLUCOSE 111* 87  BUN 47* 39*  CREATININE 3.57* 3.24*  CALCIUM 8.7* 8.2*   LFT Recent Labs    11/23/20 1900  BILITOT 0.3  ALKPHOS 50  AST 15  ALT 11  PROT 6.7  ALBUMIN 3.7     Lipase No results for input(s): LIPASE in the last 72 hours.  PT/INR Recent Labs    11/24/20 0417  LABPROT 16.8*  INR 1.4*      Imaging Studies: DG Chest Portable 1 View  Result Date: 11/23/2020 CLINICAL DATA:  Weakness, shortness of breath EXAM: PORTABLE CHEST 1 VIEW COMPARISON:  10/21/2020 FINDINGS: Single frontal view of the chest demonstrates an unremarkable cardiac silhouette. No acute airspace disease, effusion, or pneumothorax. No acute bony abnormalities. IMPRESSION: 1. Stable exam, no acute process. Electronically Signed   By: Legrand Como  Owens Shark M.D.   On: 11/23/2020 19:34   DG Foot Complete Right  Result Date: 11/23/2020 CLINICAL DATA:  Fifth toe infection EXAM: RIGHT FOOT COMPLETE - 3+ VIEW COMPARISON:  11/18/2020 FINDINGS: No definitive fracture is seen. Stable calcifications are noted along the fifth toe laterally similar to that seen on the prior exam. No definitive erosive changes are seen. Tarsal degenerative changes and calcaneal spurring is seen. No soft tissue abnormality is noted. 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 No definitive bony erosive changes are noted. Electronically Signed   By: Inez Catalina M.D.   On: 11/23/2020 20:30   DG Toe 5th Right  Result Date: 11/18/2020 CLINICAL DATA:  Right toe pain.  No known injury.  History of gout. EXAM: RIGHT FIFTH TOE COMPARISON:  None. FINDINGS: There is no evidence of fracture or dislocation. Joint spaces are preserved. Suspected erosion along the lateral cortex of the fifth toe middle phalanx. There is small mineralized densities within the soft tissues along the lateral aspect of the fifth toe near the proximal interphalangeal joint. IMPRESSION: Suspected erosion along the lateral cortex of the fifth toe middle phalanx. Small mineralized densities within the soft tissues along the lateral aspect of the fifth toe near the proximal  interphalangeal joint. 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 Electronically Signed   By: Davina Poke D.O.   On: 11/18/2020 09:45  [4 week]   Impression: Pleasant 74 year old female presenting with symptomatic anemia, recent onset anemia in the setting of declining renal function.  Anemia: Hemoglobin was normal back in June.  Hemoglobin down in the 9 range from September to November.  Presents with hemoglobin of 6.8.  Normal MCV.  Iron indices trending towards IDA.  Heme positive stool this admission.  No overt GI bleeding.  Hemoglobin up appropriately after 1 unit of packed red blood cells, likely would benefit from an additional unit this admission. Colonoscopy up-to-date.  Would consider upper endoscopy to evaluate upper GI source given use of Eliquis.  If EGD negative she may require small bowel capsule study.  Last dose of Eliquis 11/23/2020 at 10 AM.  Hypokalemia/hyponatremia  Acute kidney injury: Creatinine 1.36 six months ago.  At time of discharge November 2 her creatinine was 2.3.  Followed by nephrology.  Plan: 1. Management of electrolyte abnormalities per attending. 2. Move towards upper endoscopy plus or minus capsule endoscopy, time to be determined but anticipate tomorrow given last dose of Eliquis less than 24 hours ago and ongoing hyponatremia. Appropriate for conscious sedation. ASAII. 3. Continue PPI. 4. Consider additional unit of packed red blood cells.  We would like to thank you for the opportunity to participate in the care of MUNTAHA VERMETTE.  Laureen Ochs. Bernarda Caffey [facility] [phone] 12/2/20219:32 AM     LOS: 0 days     Addendum: Plans for EGD tomorrow to allow for further correction of hyponatremia, and allow further clearance of Eliquis (closer to 48 hours given her renal insufficiency).  Laureen Ochs. Bernarda Caffey [facility] [phone] 12/2/20212:10 PM

## 2020-11-24 NOTE — Progress Notes (Addendum)
PROGRESS NOTE    [name] [name]  TDV:[id number] DOB: 1946-04-15 DOA: 11/23/2020 PCP: Andres Shad, MD   Brief Narrative:  Per HPI: [name] [name] is a 74 y.o. female with medical history significant of gout, grade 2 diastolic dysfunction, hyperlipidemia, hypertension, hypokalemia, OSA not on CPAP, paroxysmal atrial fibrillation, history of neurocardiogenic syncope, constipation, GERD, peptic ulcer disease who is referred by her PCP, Dr. Theador Hawthorne, to the emergency department for evaluation of progressively worse weakness and fatigue for the past 5 days.  Her hemoglobin level was found to have a decrease from 9.6 g/dL 33 days ago to 6.8 g/dL today.  The patient is currently being treated for right fifth toe infection with Bactrim twice daily by her PCP.  She denies fever, chills or night sweats.  No rhinorrhea, sore throat, but complains of occasional cough associated with wheezing and dyspnea.  She denies chest pain, palpitations, diaphoresis, PND, orthopnea or recent pitting edema of the lower extremities.  No abdominal pain, nausea, emesis, diarrhea or hematochezia.  She is not 100% sure about having melena recently and she gets occasionally constipated.  She denies dysuria, frequency or hematuria.  No polyuria, polydipsia, polyphagia or blurred vision.  -Patient was admitted with symptomatic anemia in the setting of positive stool occult and also noted AKI on CKD.  She has received 1 unit of PRBCs and GI has assessed patient with plans for upper endoscopy.  Assessment & Plan:   Principal Problem:   Symptomatic anemia Active Problems:   Essential hypertension, benign   Hypokalemia   GERD (gastroesophageal reflux disease)   Hyperlipidemia   Gout   Paroxysmal atrial fibrillation (HCC)   Near syncope   OSA (obstructive sleep apnea)   AKI (acute kidney injury) (HCC)   Microcytic anemia   Grade II diastolic dysfunction   Hyponatremia   Symptomatic anemia-improving Status post 1  unit PRBC with no further symptomatology noted Transfuse for hemoglobin less than 7 Monitor hematocrit and hemoglobin. N.p.o. for now and advance diet per GI GI recommending upper endoscopy which will be scheduled    Microcytic anemia As above. Hold iron supplement pending GI evaluation.    AKI on CKD stage IV with baseline creatinine 2.3-2.8 Hold diuretics. Hold ARB. Maintain on IV fluid with normal saline Avoid nephrotoxic medications.  Monitor intake and output. Monitor renal function electrolytes.    Hypokalemia-improving Oral replacement to be further given Check magnesium level. Follow-up potassium level in a.m.    Hyponatremia Improving on normal saline Hold furosemide. Follow-up sodium level.    Paroxysmal atrial fibrillation (HCC) CHA2DS2-VASc score 5 Continue Tambocor 50 mg p.o. twice daily. Hold Eliquis due to acute blood loss anemia.    Grade II diastolic dysfunction No signs of decompensation at this time. Diuretics and ARB on hold due to AKI.    Near syncope In the setting of hypovolemia. See nurses notes for further detail. Holding antihypertensives. Improved after PRBC transfusion    Essential hypertension, benign Hold antihypertensives. Monitor blood pressure.    GERD (gastroesophageal reflux disease) On pantoprazole 40 mg IVPB twice daily.    Hyperlipidemia Not taking her simvastatin. Will not restart while in the hospital. Follow-up with PCP.    Gout Not taking colchicine at this time. Follow-up with PCP.  Right toe infection Continue on Bactrim as prescribed No extensive wound noted    OSA (obstructive sleep apnea) Not on CPAP.   DVT prophylaxis: SCDs Code Status: Full code Family Communication: Spoke with sister on phone 12/2 Disposition  Plan:  Status is: Observation  The patient will require care spanning > 2 midnights and should be moved to inpatient because: Ongoing diagnostic testing needed not appropriate  for outpatient work up, IV treatments appropriate due to intensity of illness or inability to take PO and Inpatient level of care appropriate due to severity of illness  Dispo: The patient is from: Home              Anticipated d/c is to: Home              Anticipated d/c date is: 1 day              Patient currently is not medically stable to d/c. Patient needs EGD and further workup per GI.   Consultants:   GI  Procedures:   See below  Antimicrobials:   None   Subjective: Patient seen and evaluated today with no new acute complaints or concerns. No acute concerns or events noted overnight.  She appears to be feeling much less fatigued and overall much better after her PRBC transfusion.  No overt bleeding otherwise noted.  Objective: Vitals:   11/24/20 0700 11/24/20 0730 11/24/20 0900 11/24/20 1030  BP: (!) 129/51 (!) 136/55 (!) 143/44 (!) 132/107  Pulse: 73 72 69 74  Resp: (!) 22 20 19 16   Temp:      TempSrc:      SpO2: 100% 100% 100% 100%  Weight:      Height:        Intake/Output Summary (Last 24 hours) at 11/24/2020 1209 Last data filed at 11/24/2020 0735 Gross per 24 hour  Intake 1692.08 ml  Output --  Net 1692.08 ml   Filed Weights   11/23/20 1635  Weight: 68.5 kg    Examination:  General exam: Appears calm and comfortable  Respiratory system: Clear to auscultation. Respiratory effort normal. Cardiovascular system: S1 & S2 heard, RRR. Gastrointestinal system: Abdomen is nondistended, soft and nontender.  Central nervous system: Alert and oriented.  Extremities: No edema Skin: No rashes, lesions or ulcers Psychiatry: Judgement and insight appear normal. Mood & affect appropriate.     Data Reviewed: I have personally reviewed following labs and imaging studies  CBC: Recent Labs  Lab 11/18/20 0808 11/23/20 1900 11/24/20 0417  WBC 15.1* 9.1 9.7  NEUTROABS 8.8* 4.8  --   HGB 7.1* 6.8* 7.5*  HCT 23.6* 22.2* 24.3*  MCV 86.8 83.5 85.3  PLT 598*  453* 454*   Basic Metabolic Panel: Recent Labs  Lab 11/18/20 0808 11/23/20 1900 11/24/20 0417  NA 128* 123* 126*  K 2.9* 2.8* 3.4*  CL 84* 84* 90*  CO2 30 29 25   GLUCOSE 93 111* 87  BUN 64* 47* 39*  CREATININE 2.85* 3.57* 3.24*  CALCIUM 9.2 8.7* 8.2*   GFR: Estimated Creatinine Clearance: 13.5 mL/min (A) (by C-G formula based on SCr of 3.24 mg/dL (H)). Liver Function Tests: Recent Labs  Lab 11/23/20 1900  AST 15  ALT 11  ALKPHOS 50  BILITOT 0.3  PROT 6.7  ALBUMIN 3.7   No results for input(s): LIPASE, AMYLASE in the last 168 hours. No results for input(s): AMMONIA in the last 168 hours. Coagulation Profile: Recent Labs  Lab 11/24/20 0417  INR 1.4*   Cardiac Enzymes: No results for input(s): CKTOTAL, CKMB, CKMBINDEX, TROPONINI in the last 168 hours. BNP (last 3 results) No results for input(s): PROBNP in the last 8760 hours. HbA1C: No results for input(s): HGBA1C in the last 72  hours. CBG: Recent Labs  Lab 11/23/20 1858  GLUCAP 102*   Lipid Profile: No results for input(s): CHOL, HDL, LDLCALC, TRIG, CHOLHDL, LDLDIRECT in the last 72 hours. Thyroid Function Tests: No results for input(s): TSH, T4TOTAL, FREET4, T3FREE, THYROIDAB in the last 72 hours. Anemia Panel: No results for input(s): VITAMINB12, FOLATE, FERRITIN, TIBC, IRON, RETICCTPCT in the last 72 hours. Sepsis Labs: No results for input(s): PROCALCITON, LATICACIDVEN in the last 168 hours.  Recent Results (from the past 240 hour(s))  Resp Panel by RT-PCR (Flu A&B, Covid) Nasopharyngeal Swab     Status: None   Collection Time: 11/23/20  7:16 PM   Specimen: Nasopharyngeal Swab; Nasopharyngeal(NP) swabs in vial transport medium  Result Value Ref Range Status   SARS Coronavirus 2 by RT PCR NEGATIVE NEGATIVE Final    Comment: (NOTE) SARS-CoV-2 target nucleic acids are NOT DETECTED.  The SARS-CoV-2 RNA is generally detectable in upper respiratory specimens during the acute phase of infection. The  lowest concentration of SARS-CoV-2 viral copies this assay can detect is 138 copies/mL. A negative result does not preclude SARS-Cov-2 infection and should not be used as the sole basis for treatment or other patient management decisions. A negative result may occur with  improper specimen collection/handling, submission of specimen other than nasopharyngeal swab, presence of viral mutation(s) within the areas targeted by this assay, and inadequate number of viral copies(<138 copies/mL). A negative result must be combined with clinical observations, patient history, and epidemiological information. The expected result is Negative.  Fact Sheet for Patients:  EntrepreneurPulse.com.au  Fact Sheet for Healthcare Providers:  IncredibleEmployment.be  This test is no t yet approved or cleared by the [place] FDA and  has been authorized for detection and/or diagnosis of SARS-CoV-2 by FDA under an Emergency Use Authorization (EUA). This EUA will remain  in effect (meaning this test can be used) for the duration of the COVID-19 declaration under Section 564(b)(1) of the Act, 21 U.S.C.section 360bbb-3(b)(1), unless the authorization is terminated  or revoked sooner.       Influenza A by PCR NEGATIVE NEGATIVE Final   Influenza B by PCR NEGATIVE NEGATIVE Final    Comment: (NOTE) The Xpert Xpress SARS-CoV-2/FLU/RSV plus assay is intended as an aid in the diagnosis of influenza from Nasopharyngeal swab specimens and should not be used as a sole basis for treatment. Nasal washings and aspirates are unacceptable for Xpert Xpress SARS-CoV-2/FLU/RSV testing.  Fact Sheet for Patients: EntrepreneurPulse.com.au  Fact Sheet for Healthcare Providers: IncredibleEmployment.be  This test is not yet approved or cleared by the [place] FDA and has been authorized for detection and/or diagnosis of SARS-CoV-2 by FDA under  an Emergency Use Authorization (EUA). This EUA will remain in effect (meaning this test can be used) for the duration of the COVID-19 declaration under Section 564(b)(1) of the Act, 21 U.S.C. section 360bbb-3(b)(1), unless the authorization is terminated or revoked.  Performed at [facility], [address] [place], [place] [address]          Radiology Studies: DG Chest Portable 1 View  Result Date: 11/23/2020 CLINICAL DATA:  Weakness, shortness of breath EXAM: PORTABLE CHEST 1 VIEW COMPARISON:  10/21/2020 FINDINGS: Single frontal view of the chest demonstrates an unremarkable cardiac silhouette. No acute airspace disease, effusion, or pneumothorax. No acute bony abnormalities. IMPRESSION: 1. Stable exam, no acute process. Electronically Signed   By: Randa Ngo M.D.   On: 11/23/2020 19:34   DG Foot Complete Right  Result Date: 11/23/2020 CLINICAL DATA:  Fifth toe infection EXAM: RIGHT FOOT COMPLETE - 3+ VIEW COMPARISON:  11/18/2020 FINDINGS: No definitive fracture is seen. Stable calcifications are noted along the fifth toe laterally similar to that seen on the prior exam. No definitive erosive changes are seen. Tarsal degenerative changes and calcaneal spurring is seen. No soft tissue abnormality is noted. 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 No definitive bony erosive changes are noted. Electronically Signed   By: Inez Catalina M.D.   On: 11/23/2020 20:30        Scheduled Meds: . flecainide  50 mg Oral BID  . gabapentin  300 mg Oral TID  . lubiprostone  24 mcg Oral BID  . pantoprazole (PROTONIX) IV  40 mg Intravenous Q12H  . potassium chloride  40 mEq Oral Once  . sulfamethoxazole-trimethoprim  1 tablet Oral BID   Continuous Infusions: . sodium chloride       LOS: 0 days    Time spent: 35 minutes    [name] Spanos Darleen Crocker, DO [facility]  If 7PM-7AM, please contact  night-coverage [email or web address] 11/24/2020, 12:09 PM

## 2020-11-24 NOTE — ED Notes (Signed)
Pt was given a breakfast tray 

## 2020-11-24 NOTE — Plan of Care (Signed)

## 2020-11-24 NOTE — ED Notes (Signed)
Pt ambulated to the bathroom with a steady gait & visitor assistance.

## 2020-11-24 NOTE — TOC Initial Note (Signed)
Transition of Care [facility]) - Initial/Assessment Note    Patient Details  Name: [name] [name] MRN: [id number] Date of Birth: September 11, 1946  [facility] CM/SW Contact:    [name], [place] Phone Number: 11/24/2020, 8:18 PM  Clinical Narrative:                 Pt admitted for symptomatic anemia. Pt high risk for readmission. Pt states that she lives alone and can complete ADLS independently. Pt states that she drives. Pt states that she has not had [place] services before. Pt does not use any DME. CSW explained role of TOC and that TOC will follow up with pt before d/c. TOC to follow.    Expected Discharge Plan: Home/Self Care Barriers to Discharge: Continued Medical Work up   Patient Goals and CMS Choice Patient states their goals for this hospitalization and ongoing recovery are:: Return home   Choice offered to / list presented to : NA  Expected Discharge Plan and Services Expected Discharge Plan: Home/Self Care In-house Referral: NA Discharge Planning Services: NA Post Acute Care Choice: NA Living arrangements for the past 2 months: Single Family Home                 DME Arranged: N/A DME Agency: NA       HH Arranged: NA HH Agency: NA        Prior Living Arrangements/Services Living arrangements for the past 2 months: Single Family Home Lives with:: Self Patient language and need for interpreter reviewed:: Yes Do you feel safe going back to the place where you live?: Yes        Care giver support system in place?: Yes (comment) [name] (Sister) [phone])   Criminal Activity/Legal Involvement Pertinent to Current Situation/Hospitalization: No - Comment as needed  Activities of Daily Living Home Assistive Devices/Equipment: None ADL Screening (condition at time of admission) Patient's cognitive ability adequate to safely complete daily activities?: Yes Is the patient deaf or have difficulty hearing?: No Does the patient have difficulty  seeing, even when wearing glasses/contacts?: No Does the patient have difficulty concentrating, remembering, or making decisions?: No Patient able to express need for assistance with ADLs?: Yes Does the patient have difficulty dressing or bathing?: No Independently performs ADLs?: Yes (appropriate for developmental age) Does the patient have difficulty walking or climbing stairs?: No Weakness of Legs: None Weakness of Arms/Hands: None  Permission Sought/Granted                  Emotional Assessment Appearance:: Appears stated age Attitude/Demeanor/Rapport: Engaged Affect (typically observed): Accepting Orientation: : Oriented to Self, Oriented to Place, Oriented to  Time, Oriented to Situation Alcohol / Substance Use: Not Applicable Psych Involvement: No (comment)  Admission diagnosis:  Hypokalemia [E87.6] Acute blood loss anemia [D62] Open wound of toe, initial encounter [S91.109A] Symptomatic anemia [D64.9] Acute renal failure superimposed on chronic kidney disease, unspecified CKD stage, unspecified acute renal failure type ([place]) [N17.9, N18.9] Patient Active Problem List   Diagnosis Date Noted  . Acute blood loss anemia 11/24/2020  . Heme positive stool   . Iron deficiency anemia due to chronic blood loss   . Symptomatic anemia 11/23/2020  . Grade II diastolic dysfunction 14/43/1540  . Hyponatremia 11/23/2020  . Symptomatic bradycardia 10/20/2020  . AKI (acute kidney injury) ([place]) 10/20/2020  . Microcytic anemia 10/20/2020  . OSA (obstructive sleep apnea) 08/09/2020  . Paroxysmal atrial fibrillation ([place]) 06/15/2020  . Bradycardia with 31-40 beats per minute 06/15/2020  .  Snoring 06/15/2020  . Intermittent palpitations 06/15/2020  . Near syncope 06/15/2020  . Hypertension, uncontrolled 06/15/2020  . Atrial fibrillation with RVR ([place]) 05/04/2020  . Hyperlipidemia   . Gout   . Encounter for screening colonoscopy   . Hemorrhoid 12/16/2018  . GERD  (gastroesophageal reflux disease) 01/29/2018  . Constipation 07/17/2017  . Rectal bleeding 01/30/2017  . Chest pain 12/09/2013  . Hypokalemia 05/01/2012  . Renal insufficiency 05/01/2012  . Essential hypertension, benign 05/03/2010  . VASOVAGAL SYNCOPE 05/03/2010   PCP:  Andres Shad, MD Pharmacy:   [id number] [place], [address] [place] [address] Phone: [phone] Fax: [phone]  [facility] [place] [place] [address] [place] [address] Phone: [phone] Fax: [phone]     Social Determinants of Health (SDOH) Interventions    Readmission Risk Interventions Readmission Risk Prevention Plan 11/24/2020  Transportation Screening Complete  Medication Review ([place]) Complete  HRI or [place] Complete  SW Recovery Care/Counseling Consult Complete  [place] Not Applicable  Some recent data might be hidden

## 2020-11-24 NOTE — Consult Note (Addendum)
Referring Provider: Rodena Goldmann, DO Primary Care Physician:  Andres Shad, MD Primary Gastroenterologist:  Elon Alas. Abbey Chatters, DO  Reason for Consultation:  Symptomatic anemia, heme positive stool  HPI: [name] [name] is a 74 y.o. female with past medical history for A. fib on Eliquis, hypertension, grade 2 diastolic dysfunction, active right fifth toe infection presented to the hospital with complaints of fatigue, dizziness, lightheadedness.  Seen by PCP who obtained labs, hemoglobin 7 and advised to come to ED.  Patient denies melena or rectal bleeding.  Bowel movements have been regular.  Stools have been dark brown on iron.  No upper GI symptoms.  No abdominal pain.  She takes Eliquis twice daily for A. fib, last dose on December 1 at 10 AM.  Denies aspirin.  She takes PPI daily.  In the ED, no gross blood on rectal exam, stool heme positive.  Hemoglobin 6.8, hematocrit 22.2, potassium 2.8, BUN 47, creatinine 3.5 (baseline appears to be around 2.5), sodium 123.  Received 1 unit of packed red blood cells overnight.  Hemoglobin this morning 7.5.  Sodium up to 126, potassium up to 3.4, creatinine down to 3.24, BUN down to 39.  INR 1.4.  Back in June her hemoglobin was 12.1.  In September hemoglobin 9.5.  Hemoglobin remained stable up until November 26 she was noted to have a hemoglobin of 7.1.   Patient had colonoscopy March 2021.  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  Nodular mucosa at the anus, biopsied with benign findings.  No prior upper endoscopy although she states years ago she was told she had a small peptic ulcer.  Prior to Admission medications   Medication Sig Start Date End Date Taking? Authorizing Provider  acetaminophen (TYLENOL) 500 MG tablet Take 1,000 mg by mouth every 6 (six) hours as needed for moderate pain.   Yes [provider]  albuterol (VENTOLIN HFA) 108 (90 Base) MCG/ACT inhaler Inhale 1-2 puffs into the lungs every 6 (six)  hours as needed for wheezing or shortness of breath.  09/23/20  Yes [provider]  apixaban (ELIQUIS) 5 MG TABS tablet Take 1 tablet (5 mg total) by mouth 2 (two) times daily. 07/25/20 07/20/21 Yes Satira Sark, MD  cetirizine (ZYRTEC) 10 MG tablet Take 10 mg by mouth daily as needed for allergies.   Yes [provider]  Cholecalciferol (VITAMIN D-3) 25 MCG (1000 UT) CAPS Take 1 capsule by mouth daily.   Yes [provider]  diclofenac Sodium (VOLTAREN) 1 % GEL Apply 1 application topically 4 (four) times daily as needed (pain).   Yes [provider]  ELDERBERRY PO Take 2 capsules by mouth daily.    Yes [provider]  ferrous sulfate 325 (65 FE) MG tablet Take 325 mg by mouth daily with breakfast.   Yes [provider]  flecainide (TAMBOCOR) 50 MG tablet Take 1 tablet (50 mg total) by mouth 2 (two) times daily. 10/25/20 11/24/20 Yes Pahwani, Einar Grad, MD  fluticasone (FLONASE) 50 MCG/ACT nasal spray Place 1 spray into both nostrils daily as needed for allergies or rhinitis.   Yes [provider]  furosemide (LASIX) 40 MG tablet Take 1 tablet (40 mg total) by mouth daily. 10/27/20 01/25/21 Yes Darliss Cheney, MD  gabapentin (NEURONTIN) 300 MG capsule Take 300 mg by mouth 3 (three) times daily. 11/21/20  Yes [provider]  losartan (COZAAR) 25 MG tablet Take 0.5 tablets (12.5 mg total) by mouth daily. 10/27/20  Yes  Darliss Cheney, MD  lubiprostone (AMITIZA) 24 MCG capsule Take 1 capsule (24 mcg total) by mouth in the morning and at bedtime. 09/15/20 09/15/21 Yes Carver, Elon Alas, DO  meclizine (ANTIVERT) 25 MG tablet Take 25 mg by mouth 3 (three) times daily as needed for dizziness.    Yes [provider]  omeprazole (PRILOSEC) 40 MG capsule Take 40 mg by mouth daily.     Yes [provider]  potassium chloride SA (KLOR-CON) 20 MEQ tablet Take 20 mEq by mouth in the morning, at noon, and at bedtime.   Yes [provider]  sulfamethoxazole-trimethoprim (BACTRIM DS) 800-160 MG tablet Take 1 tablet by mouth 2 (two) times daily. 11/21/20  Yes [provider]  vitamin C (ASCORBIC ACID) 250 MG tablet Take 500 mg by mouth daily.   Yes [provider]  APPLE CIDER VINEGAR PO Take 2 capsules by mouth daily. Patient not taking: Reported on 11/23/2020    [provider]  colchicine 0.6 MG tablet Take 0.6 mg by mouth daily. Patient not taking: Reported on 11/23/2020 01/31/20   [provider]  cyclobenzaprine (FLEXERIL) 5 MG tablet Take 5 mg by mouth 3 (three) times daily as needed for muscle spasms.  Patient not taking: Reported on 11/23/2020 06/19/20   [provider]  cyproheptadine (PERIACTIN) 4 MG tablet Take 4 mg by mouth 3 (three) times daily as needed for allergies. Patient not taking: Reported on 11/23/2020    [provider]  Fluticasone-Salmeterol (ADVAIR) 500-50 MCG/DOSE AEPB Inhale 1 puff into the lungs every 12 (twelve) hours. Patient not taking: Reported on 11/23/2020    [provider]  hydrALAZINE (APRESOLINE) 50 MG tablet Take 1 tablet (50 mg total) by mouth 3 (three) times daily. Patient not taking: Reported on 11/23/2020 10/25/20 11/24/20  Darliss Cheney, MD  simvastatin (ZOCOR) 40 MG tablet Take 40 mg by mouth daily in the afternoon.  Patient not taking: Reported on 11/23/2020    [provider]    Current Facility-Administered Medications  Medication Dose Route Frequency Provider Last Rate Last Admin  . albuterol (VENTOLIN HFA) 108 (90 Base) MCG/ACT inhaler 1-2 puff  1-2 puff Inhalation Q6H PRN Reubin Milan, MD      . flecainide [facility]) tablet 50 mg  50 mg Oral BID Reubin Milan, MD   50 mg at 11/23/20 2228  . gabapentin (NEURONTIN) capsule 300 mg  300 mg Oral TID Reubin Milan, MD   300 mg at 11/23/20 2210  . lubiprostone (AMITIZA) capsule 24 mcg  24 mcg Oral BID Reubin Milan, MD   24 mcg at  11/23/20 2228  . ondansetron (ZOFRAN) tablet 4 mg  4 mg Oral Q6H PRN Reubin Milan, MD       Or  . ondansetron [facility]) injection 4 mg  4 mg Intravenous Q6H PRN Reubin Milan, MD      . pantoprazole (PROTONIX) injection 40 mg  40 mg Intravenous Q12H Reubin Milan, MD   40 mg at 11/23/20 2125  . sulfamethoxazole-trimethoprim (BACTRIM DS) 800-160 MG per tablet 1 tablet  1 tablet Oral BID Reubin Milan, MD   1 tablet at 11/23/20 2210   Current Outpatient Medications  Medication Sig Dispense Refill  . acetaminophen (TYLENOL) 500 MG tablet Take 1,000 mg by mouth every 6 (six) hours as needed for moderate pain.    Marland Kitchen albuterol (VENTOLIN HFA) 108 (90 Base) MCG/ACT inhaler Inhale 1-2 puffs into the lungs every 6 (  six) hours as needed for wheezing or shortness of breath.     Marland Kitchen apixaban (ELIQUIS) 5 MG TABS tablet Take 1 tablet (5 mg total) by mouth 2 (two) times daily. 180 tablet 3  . cetirizine (ZYRTEC) 10 MG tablet Take 10 mg by mouth daily as needed for allergies.    . Cholecalciferol (VITAMIN D-3) 25 MCG (1000 UT) CAPS Take 1 capsule by mouth daily.    . diclofenac Sodium (VOLTAREN) 1 % GEL Apply 1 application topically 4 (four) times daily as needed (pain).    Marland Kitchen ELDERBERRY PO Take 2 capsules by mouth daily.     . ferrous sulfate 325 (65 FE) MG tablet Take 325 mg by mouth daily with breakfast.    . flecainide (TAMBOCOR) 50 MG tablet Take 1 tablet (50 mg total) by mouth 2 (two) times daily. 60 tablet 0  . fluticasone (FLONASE) 50 MCG/ACT nasal spray Place 1 spray into both nostrils daily as needed for allergies or rhinitis.    . furosemide (LASIX) 40 MG tablet Take 1 tablet (40 mg total) by mouth daily. 90 tablet 3  . gabapentin (NEURONTIN) 300 MG capsule Take 300 mg by mouth 3 (three) times daily.    Marland Kitchen losartan (COZAAR) 25 MG tablet Take 0.5 tablets (12.5 mg total) by mouth daily.    Marland Kitchen lubiprostone (AMITIZA) 24 MCG capsule Take 1 capsule (24 mcg total) by mouth in the morning  and at bedtime. 180 capsule 3  . meclizine (ANTIVERT) 25 MG tablet Take 25 mg by mouth 3 (three) times daily as needed for dizziness.     Marland Kitchen omeprazole (PRILOSEC) 40 MG capsule Take 40 mg by mouth daily.      . potassium chloride SA (KLOR-CON) 20 MEQ tablet Take 20 mEq by mouth in the morning, at noon, and at bedtime.    . sulfamethoxazole-trimethoprim (BACTRIM DS) 800-160 MG tablet Take 1 tablet by mouth 2 (two) times daily.    . vitamin C (ASCORBIC ACID) 250 MG tablet Take 500 mg by mouth daily.    . APPLE CIDER VINEGAR PO Take 2 capsules by mouth daily. (Patient not taking: Reported on 11/23/2020)    . colchicine 0.6 MG tablet Take 0.6 mg by mouth daily. (Patient not taking: Reported on 11/23/2020)    . cyclobenzaprine (FLEXERIL) 5 MG tablet Take 5 mg by mouth 3 (three) times daily as needed for muscle spasms.  (Patient not taking: Reported on 11/23/2020)    . cyproheptadine (PERIACTIN) 4 MG tablet Take 4 mg by mouth 3 (three) times daily as needed for allergies. (Patient not taking: Reported on 11/23/2020)    . Fluticasone-Salmeterol (ADVAIR) 500-50 MCG/DOSE AEPB Inhale 1 puff into the lungs every 12 (twelve) hours. (Patient not taking: Reported on 11/23/2020)    . hydrALAZINE (APRESOLINE) 50 MG tablet Take 1 tablet (50 mg total) by mouth 3 (three) times daily. (Patient not taking: Reported on 11/23/2020) 90 tablet 0  . simvastatin (ZOCOR) 40 MG tablet Take 40 mg by mouth daily in the afternoon.  (Patient not taking: Reported on 11/23/2020)      Allergies as of 11/23/2020 - Review Complete 11/23/2020  Allergen Reaction Noted  . Acetaminophen-codeine Hives   . Allopurinol Hives   . Amlodipine  08/03/2020  . Amoxicillin Hives 03/04/2020  . Doxycycline  11/23/2020  . Tramadol Hives     Past Medical History:  Diagnosis Date  . Constipation 07/17/2017  . GERD (gastroesophageal reflux disease)   . Gout   . Grade II  diastolic dysfunction 18/07/4165  . Hyperlipidemia   . Hypertension   .  Hypokalemia   . OSA (obstructive sleep apnea) 08/09/2020  . PAF (paroxysmal atrial fibrillation) ([place])   . PAF (paroxysmal atrial fibrillation) ([place])   . PUD (peptic ulcer disease)   . Syncope    Neurocardiogenic    Past Surgical History:  Procedure Laterality Date  . ABDOMINAL HYSTERECTOMY    . CATARACT EXTRACTION Bilateral   . COLONOSCOPY N/A 10/07/2014   Dr. Oneida Alar: redundant left colon, moderate sized external hemorrhoids   . COLONOSCOPY N/A 03/11/2020   Procedure: COLONOSCOPY;  Surgeon: Danie Binder, MD;  Location: [facility];  Service: Endoscopy;  Laterality: N/A;  9:30am w/ overtube  . LIPOMA RESECTION    . POLYPECTOMY  03/11/2020   Procedure: POLYPECTOMY;  Surgeon: Danie Binder, MD;  Location: [facility] END[facility];  Service: Endoscopy;;  cecal, transverse, descending, sigmoid    Family History  Problem Relation Age of Onset  . Stroke Mother   . Dementia Father   . Cancer - Other Sister   . Atrial fibrillation Sister   . Heart failure Sister     Social History   Socioeconomic History  . Marital status: Single    Spouse name: Not on file  . Number of children: Not on file  . Years of education: Not on file  . Highest education level: Not on file  Occupational History  . Occupation: Retired  Tobacco Use  . Smoking status: Former Smoker    Packs/day: 1.00    Years: 20.00    Pack years: 20.00    Types: Cigarettes    Start date: 03/16/1964    Quit date: 10/07/1997    Years since quitting: 23.1  . Smokeless tobacco: Never Used  Vaping Use  . Vaping Use: Never used  Substance and Sexual Activity  . Alcohol use: No    Alcohol/week: 0.0 standard drinks  . Drug use: No  . Sexual activity: Yes    Partners: Male  Other Topics Concern  . Not on file  Social History Narrative  . Not on file   Social Determinants of Health   Financial Resource Strain:   . Difficulty of Paying Living Expenses: Not on file  Food Insecurity:   . Worried About Sales executive in the Last Year: Not on file  . Ran Out of Food in the Last Year: Not on file  Transportation Needs:   . Lack of Transportation (Medical): Not on file  . Lack of Transportation (Non-Medical): Not on file  Physical Activity:   . Days of Exercise per Week: Not on file  . Minutes of Exercise per Session: Not on file  Stress:   . Feeling of Stress : Not on file  Social Connections:   . Frequency of Communication with Friends and Family: Not on file  . Frequency of Social Gatherings with Friends and Family: Not on file  . Attends Religious Services: Not on file  . Active Member of Clubs or Organizations: Not on file  . Attends Archivist Meetings: Not on file  . Marital Status: Not on file  Intimate Partner Violence:   . Fear of Current or Ex-Partner: Not on file  . Emotionally Abused: Not on file  . Physically Abused: Not on file  . Sexually Abused: Not on file     ROS:  General: Negative for anorexia, weight loss, fever, chills.  See HPI. Eyes: Negative for vision changes.  ENT: Negative for hoarseness, difficulty swallowing , nasal congestion. CV: Negative for chest pain, angina, palpitations, dyspnea on exertion, peripheral edema.  Respiratory: Negative for dyspnea at rest, dyspnea on exertion, cough, sputum, wheezing.  GI: See history of present illness. GU:  Negative for dysuria, hematuria, urinary incontinence, urinary frequency, nocturnal urination.  MS: Negative for low back pain.  Right fifth toe pain Derm: Negative for rash or itching.  Neuro: Negative for weakness, abnormal sensation, seizure, frequent headaches, memory loss, confusion.  Psych: Negative for anxiety, depression, suicidal ideation, hallucinations.  Endo: Negative for unusual weight change.  Heme: Negative for bruising or bleeding. Allergy: Negative for rash or hives.       Physical Examination: Vital signs in last 24 hours: Temp:  [98.4 F (36.9 C)-98.6 F (37 C)] 98.6 F (37 C)  (12/01 2315) Pulse Rate:  [65-84] 72 (12/02 0730) Resp:  [12-22] 20 (12/02 0730) BP: (88-152)/(42-73) 136/55 (12/02 0730) SpO2:  [96 %-100 %] 100 % (12/02 0730) Weight:  [68.5 kg] 68.5 kg (12/01 1635)    General: Well-nourished, well-developed in no acute distress.  Sister at bedside Head: Normocephalic, atraumatic.   Eyes: Conjunctiva pink, no icterus. Mouth: Oropharyngeal mucosa moist and pink , no lesions erythema or exudate. Neck: Supple without thyromegaly, masses, or lymphadenopathy.  Lungs: Clear to auscultation bilaterally.  Heart: Regular rate and rhythm, no murmurs rubs or gallops.  Abdomen: Bowel sounds are normal, nontender, nondistended, no hepatosplenomegaly or masses, no abdominal bruits or    hernia , no rebound or guarding.   Rectal: Not performed Extremities: No lower extremity edema, clubbing, deformity.  Right fifth toe with dressing Neuro: Alert and oriented x 4 , grossly normal neurologically.  Skin: Warm and dry, no rash or jaundice.   Psych: Alert and cooperative, normal mood and affect.        Intake/Output from previous day: 12/01 0701 - 12/02 0700 In: 515 [Blood:315; IV Piggyback:200] Out: -  Intake/Output this shift: Total I/O In: 1177.1 [I.V.:1177.1] Out: -   Lab Results: CBC Recent Labs    11/23/20 1900 11/24/20 0417  WBC 9.1 9.7  HGB 6.8* 7.5*  HCT 22.2* 24.3*  MCV 83.5 85.3  PLT 453* 401*   Lab Results  Component Value Date   IRON 36 10/21/2020   TIBC 480 (H) 10/21/2020   FERRITIN 42 10/21/2020   Lab Results  Component Value Date   FOLATE 17.1 10/21/2020   Lab Results  Component Value Date   VITAMINB12 1,242 (H) 10/21/2020     BMET Recent Labs    11/23/20 1900 11/24/20 0417  NA 123* 126*  K 2.8* 3.4*  CL 84* 90*  CO2 29 25  GLUCOSE 111* 87  BUN 47* 39*  CREATININE 3.57* 3.24*  CALCIUM 8.7* 8.2*   LFT Recent Labs    11/23/20 1900  BILITOT 0.3  ALKPHOS 50  AST 15  ALT 11  PROT 6.7  ALBUMIN 3.7     Lipase No results for input(s): LIPASE in the last 72 hours.  PT/INR Recent Labs    11/24/20 0417  LABPROT 16.8*  INR 1.4*      Imaging Studies: DG Chest Portable 1 View  Result Date: 11/23/2020 CLINICAL DATA:  Weakness, shortness of breath EXAM: PORTABLE CHEST 1 VIEW COMPARISON:  10/21/2020 FINDINGS: Single frontal view of the chest demonstrates an unremarkable cardiac silhouette. No acute airspace disease, effusion, or pneumothorax. No acute bony abnormalities. IMPRESSION: 1. Stable exam, no acute process. Electronically Signed   By: Legrand Como  Owens Shark M.D.   On: 11/23/2020 19:34   DG Foot Complete Right  Result Date: 11/23/2020 CLINICAL DATA:  Fifth toe infection EXAM: RIGHT FOOT COMPLETE - 3+ VIEW COMPARISON:  11/18/2020 FINDINGS: No definitive fracture is seen. Stable calcifications are noted along the fifth toe laterally similar to that seen on the prior exam. No definitive erosive changes are seen. Tarsal degenerative changes and calcaneal spurring is seen. No soft tissue abnormality is noted. 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 No definitive bony erosive changes are noted. Electronically Signed   By: Inez Catalina M.D.   On: 11/23/2020 20:30   DG Toe 5th Right  Result Date: 11/18/2020 CLINICAL DATA:  Right toe pain.  No known injury.  History of gout. EXAM: RIGHT FIFTH TOE COMPARISON:  None. FINDINGS: There is no evidence of fracture or dislocation. Joint spaces are preserved. Suspected erosion along the lateral cortex of the fifth toe middle phalanx. There is small mineralized densities within the soft tissues along the lateral aspect of the fifth toe near the proximal interphalangeal joint. IMPRESSION: Suspected erosion along the lateral cortex of the fifth toe middle phalanx. Small mineralized densities within the soft tissues along the lateral aspect of the fifth toe near the proximal  interphalangeal joint. 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 Electronically Signed   By: Davina Poke D.O.   On: 11/18/2020 09:45  [4 week]   Impression: Pleasant 74 year old female presenting with symptomatic anemia, recent onset anemia in the setting of declining renal function.  Anemia: Hemoglobin was normal back in June.  Hemoglobin down in the 9 range from September to November.  Presents with hemoglobin of 6.8.  Normal MCV.  Iron indices trending towards IDA.  Heme positive stool this admission.  No overt GI bleeding.  Hemoglobin up appropriately after 1 unit of packed red blood cells, likely would benefit from an additional unit this admission. Colonoscopy up-to-date.  Would consider upper endoscopy to evaluate upper GI source given use of Eliquis.  If EGD negative she may require small bowel capsule study.  Last dose of Eliquis 11/23/2020 at 10 AM.  Hypokalemia/hyponatremia  Acute kidney injury: Creatinine 1.36 six months ago.  At time of discharge November 2 her creatinine was 2.3.  Followed by nephrology.  Plan: 1. Management of electrolyte abnormalities per attending. 2. Move towards upper endoscopy plus or minus capsule endoscopy, time to be determined but anticipate tomorrow given last dose of Eliquis less than 24 hours ago and ongoing hyponatremia. Appropriate for conscious sedation. ASAII. 3. Continue PPI. 4. Consider additional unit of packed red blood cells.  We would like to thank you for the opportunity to participate in the care of NYEMA HACHEY.  Laureen Ochs. Bernarda Caffey [facility] [phone] 12/2/20219:32 AM     LOS: 0 days     Addendum: Plans for EGD tomorrow to allow for further correction of hyponatremia, and allow further clearance of Eliquis (closer to 48 hours given her renal insufficiency).  Laureen Ochs. Bernarda Caffey [facility] [phone] 12/2/20212:10 PM

## 2020-11-25 ENCOUNTER — Encounter (HOSPITAL_COMMUNITY): Payer: Self-pay | Admitting: Internal Medicine

## 2020-11-25 ENCOUNTER — Ambulatory Visit: Payer: Medicare HMO | Admitting: Internal Medicine

## 2020-11-25 ENCOUNTER — Inpatient Hospital Stay (HOSPITAL_COMMUNITY): Payer: Medicare HMO | Admitting: Anesthesiology

## 2020-11-25 ENCOUNTER — Encounter (HOSPITAL_COMMUNITY)
Admission: EM | Disposition: A | Discharge status: Home / Self Care | Payer: Self-pay | Source: Home / Self Care | Attending: Internal Medicine

## 2020-11-25 DIAGNOSIS — D649 Anemia, unspecified: Secondary | ICD-10-CM | POA: Diagnosis not present

## 2020-11-25 DIAGNOSIS — K921 Melena: Secondary | ICD-10-CM

## 2020-11-25 HISTORY — PX: GIVENS CAPSULE STUDY: SHX5432

## 2020-11-25 HISTORY — PX: ESOPHAGOGASTRODUODENOSCOPY (EGD) WITH PROPOFOL: SHX5813

## 2020-11-25 LAB — CBC
HCT: 25.6 % — ABNORMAL LOW (ref 36.0–46.0)
Hemoglobin: 7.8 g/dL — ABNORMAL LOW (ref 12.0–15.0)
MCH: 26.7 pg (ref 26.0–34.0)
MCHC: 30.5 g/dL (ref 30.0–36.0)
MCV: 87.7 fL (ref 80.0–100.0)
Platelets: 376 10*3/uL (ref 150–400)
RBC: 2.92 MIL/uL — ABNORMAL LOW (ref 3.87–5.11)
RDW: 15.9 % — ABNORMAL HIGH (ref 11.5–15.5)
WBC: 7.8 10*3/uL (ref 4.0–10.5)
nRBC: 0 % (ref 0.0–0.2)

## 2020-11-25 LAB — BASIC METABOLIC PANEL
Anion gap: 9 (ref 5–15)
BUN: 27 mg/dL — ABNORMAL HIGH (ref 8–23)
CO2: 25 mmol/L (ref 22–32)
Calcium: 8.6 mg/dL — ABNORMAL LOW (ref 8.9–10.3)
Chloride: 97 mmol/L — ABNORMAL LOW (ref 98–111)
Creatinine, Ser: 2.57 mg/dL — ABNORMAL HIGH (ref 0.44–1.00)
GFR, Estimated: 19 mL/min — ABNORMAL LOW (ref >60)
Glucose, Bld: 81 mg/dL (ref 70–99)
Potassium: 3.6 mmol/L (ref 3.5–5.1)
Sodium: 131 mmol/L — ABNORMAL LOW (ref 135–145)

## 2020-11-25 LAB — MAGNESIUM: Magnesium: 2.3 mg/dL (ref 1.7–2.4)

## 2020-11-25 SURGERY — ESOPHAGOGASTRODUODENOSCOPY (EGD) WITH PROPOFOL
Anesthesia: General

## 2020-11-25 MED ORDER — SODIUM CHLORIDE 0.9 % IV SOLN: INTRAVENOUS | Status: AC

## 2020-11-25 MED ORDER — LACTATED RINGERS IV SOLN: INTRAVENOUS | Status: DC | PRN

## 2020-11-25 MED ORDER — SODIUM CHLORIDE 0.9 % IV SOLN: INTRAVENOUS | Status: DC

## 2020-11-25 MED ORDER — PROPOFOL 10 MG/ML IV BOLUS
INTRAVENOUS | Status: DC | PRN
  Administered 2020-11-25: 50 mg via INTRAVENOUS
  Administered 2020-11-25: 20 mg via INTRAVENOUS

## 2020-11-25 MED ORDER — PROPOFOL 10 MG/ML IV BOLUS
INTRAVENOUS | Status: AC
  Filled 2020-11-25: qty 80

## 2020-11-25 MED ORDER — KETAMINE HCL 50 MG/5ML IJ SOSY
PREFILLED_SYRINGE | INTRAMUSCULAR | Status: AC
  Filled 2020-11-25: qty 5

## 2020-11-25 MED ORDER — PROPOFOL 500 MG/50ML IV EMUL
INTRAVENOUS | Status: DC | PRN
  Administered 2020-11-25: 150 ug/kg/min via INTRAVENOUS

## 2020-11-25 MED ORDER — STERILE WATER FOR IRRIGATION IR SOLN
Status: DC | PRN
  Administered 2020-11-25: 100 mL

## 2020-11-25 MED ORDER — LACTATED RINGERS IV SOLN: INTRAVENOUS | Status: DC

## 2020-11-25 MED ORDER — LIDOCAINE HCL (CARDIAC) PF 100 MG/5ML IV SOSY
PREFILLED_SYRINGE | INTRAVENOUS | Status: DC | PRN
  Administered 2020-11-25: 80 mg via INTRAVENOUS

## 2020-11-25 MED ORDER — KETAMINE HCL 10 MG/ML IJ SOLN
INTRAMUSCULAR | Status: DC | PRN
  Administered 2020-11-25: 15 mg via INTRAVENOUS

## 2020-11-25 NOTE — Transfer of Care (Signed)
Immediate Anesthesia Transfer of Care Note  Patient: [name] [name]  Procedure(s) Performed: ESOPHAGOGASTRODUODENOSCOPY (EGD) WITH PROPOFOL (N/A ) GIVENS CAPSULE STUDY (N/A )  Patient Location: Endoscopy Unit  Anesthesia Type:MAC and General  Level of Consciousness: drowsy and patient cooperative  Airway & Oxygen Therapy: Patient Spontanous Breathing  Post-op Assessment: Report given to RN and Post -op Vital signs reviewed and stable  Post vital signs: Reviewed and stable  Last Vitals:  Vitals Value Taken Time  BP    Temp    Pulse    Resp    SpO2      Last Pain:  Vitals:   11/25/20 1300  TempSrc:   PainSc: 0-No pain      Patients Stated Pain Goal: 7 (96/94/09 8286)  Complications: No complications documented.

## 2020-11-25 NOTE — Plan of Care (Signed)

## 2020-11-25 NOTE — Interval H&P Note (Signed)
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  The various methods of treatment have been discussed with the patient and family. After consideration of risks, benefits and other options for treatment, the patient has consented to  Procedure(s) with comments: ESOPHAGOGASTRODUODENOSCOPY (EGD) WITH PROPOFOL (N/A) GIVENS CAPSULE STUDY (N/A) - possible capsule if IDA unexplained by EGD findings.  as a surgical intervention.  The patient's history has been reviewed, patient examined, no change in status, stable for surgery.  I have reviewed the patient's chart and labs.  Questions were answered to the patient's satisfaction.     Manus Rudd  Patient seen and examined in the endoscopy unit.  Hemoglobin 7.8 after 1 unit transfusion.  Patient does endorse melena recently.  No dysphagia. Reviewed plans for diagnostic EGD with possible capsule deployment per plan. The risks, benefits, limitations, alternatives and imponderables have been reviewed with the patient. Potential for esophageal dilation, biopsy, etc. have also been reviewed.  Questions have been answered. All parties agreeable.   Further recommendations to follow.

## 2020-11-25 NOTE — Progress Notes (Signed)
PROGRESS NOTE    [name] [name]  BHA:[id number] DOB: Sep 23, 1946 DOA: 11/23/2020 PCP: Andres Shad, MD   Brief Narrative:  Per HPI: [name] [name] a 74 y.o.femalewith medical history significant ofgout, grade 2 diastolic dysfunction, hyperlipidemia, hypertension, hypokalemia, OSA not on CPAP, paroxysmal atrial fibrillation, history of neurocardiogenic syncope, constipation, GERD, peptic ulcer disease who is referred by her PCP, Dr. Theador Hawthorne, to the emergency department for evaluation of progressively worse weakness and fatigue for the past 5 days. Her hemoglobin level was found to have a decrease from 9.6 g/dL 33 days ago to 6.8 g/dL today. The patient is currently being treated for right fifth toe infection with Bactrim twice daily by her PCP. She denies fever, chills or night sweats. No rhinorrhea, sore throat, but complains of occasional cough associated with wheezing and dyspnea. She denies chest pain, palpitations, diaphoresis, PND, orthopnea or recent pitting edema of the lower extremities. No abdominal pain, nausea, emesis, diarrhea or hematochezia. She is not 100% sure about having melena recently and she gets occasionally constipated. She denies dysuria, frequency or hematuria. No polyuria, polydipsia, polyphagia or blurred vision.  -Patient was admitted with symptomatic anemia in the setting of positive stool occult and also noted AKI on CKD.  She has received 1 unit of PRBCs and has undergone EGD on 12/3 with capsule endoscopy results pending.  Assessment & Plan:   Principal Problem:   Symptomatic anemia Active Problems:   Essential hypertension, benign   Hypokalemia   GERD (gastroesophageal reflux disease)   Hyperlipidemia   Gout   Paroxysmal atrial fibrillation (HCC)   Near syncope   OSA (obstructive sleep apnea)   AKI (acute kidney injury) (HCC)   Microcytic anemia   Grade II diastolic dysfunction   Hyponatremia   Acute blood loss  anemia   Symptomatic anemia-improving and stable Status post 1 unit PRBC with no further symptomatology noted Transfuse for hemoglobin less than 7, but remaining stable at this time Monitor hematocrit and hemoglobin. N.p.o. for now and advance diet per GI GI recommending upper endoscopy which will be scheduled  Microcytic anemia As above. Hold iron supplement pending GI evaluation.  AKI on CKD stage IV with baseline creatinine 2.3-2.8-improved Hold diuretics. Hold ARB. Maintain on IV fluid with normal saline Avoid nephrotoxic medications. Monitor intake and output. Monitor renal function electrolytes.  Hyponatremia Improved on normal saline Hold furosemide. Follow-up sodium level.  Paroxysmal atrial fibrillation (HCC) CHA2DS2-VASc score 5 Continue Tambocor 50 mg p.o. twice daily. Hold Eliquis due to acute blood loss anemia Further capsule studies pending  Grade II diastolic dysfunction No signs of decompensation at this time. Diuretics and ARB on hold due to AKI.  Nearsyncope-resolved In the setting of hypovolemia with acute blood loss See nurses notes for further detail. Holding antihypertensives. Improved after PRBC transfusion  Essential hypertension, benign Hold antihypertensives. Monitor blood pressure.  GERD (gastroesophageal reflux disease) On pantoprazole 40 mg IVPB twice daily.  Hyperlipidemia Not taking her simvastatin. Will not restart while in the hospital. Follow-up with PCP.  Gout Not taking colchicine at this time. Follow-up with PCP.  Right toe infection Continue on Bactrim as prescribed No extensive wound noted  OSA (obstructive sleep apnea) Not on CPAP.   DVT prophylaxis: SCDs Code Status: Full code Family Communication: Spoke with sister at bedside 12/3 Disposition Plan:  Status is: Inpatient  Remains inpatient appropriate because:IV treatments appropriate due to intensity of illness or  inability to take PO and Inpatient level of care appropriate due to severity  of illness   Dispo: The patient is from: Home              Anticipated d/c is to: Home              Anticipated d/c date is: 1 day              Patient currently is not medically stable to d/c.  Patient needs further capsule results prior to discharge.  Consultants:   GI  Procedures:   See below  Antimicrobials:   None   Subjective: Patient seen and evaluated today with no new acute complaints or concerns. No acute concerns or events noted overnight.  Objective: Vitals:   11/25/20 0730 11/25/20 1155 11/25/20 1316 11/25/20 1353  BP: (!) 142/51 (!) 165/64 (!) 124/43 (!) 153/56  Pulse: 71 87 71 73  Resp: 18 16 13 16   Temp: 98.3 F (36.8 C) 98.9 F (37.2 C) 97.7 F (36.5 C) 98.1 F (36.7 C)  TempSrc: Oral Oral Oral Oral  SpO2: 100% 98% 100% 100%  Weight:  68.5 kg    Height:  5\' 2"  (1.575 m)      Intake/Output Summary (Last 24 hours) at 11/25/2020 1523 Last data filed at 11/25/2020 1315 Gross per 24 hour  Intake 1400 ml  Output --  Net 1400 ml   Filed Weights   11/23/20 1635 11/25/20 1155  Weight: 68.5 kg 68.5 kg    Examination:  General exam: Appears calm and comfortable  Respiratory system: Clear to auscultation. Respiratory effort normal. Cardiovascular system: S1 & S2 heard, RRR. Gastrointestinal system: Abdomen is nondistended, soft and nontender. Central nervous system: Alert and awake Extremities: No edema Skin: No rashes, lesions or ulcers Psychiatry: Judgement and insight appear normal. Mood & affect appropriate.     Data Reviewed: I have personally reviewed following labs and imaging studies  CBC: Recent Labs  Lab 11/23/20 1900 11/24/20 0417 11/25/20 0417  WBC 9.1 9.7 7.8  NEUTROABS 4.8  --   --   HGB 6.8* 7.5* 7.8*  HCT 22.2* 24.3* 25.6*  MCV 83.5 85.3 87.7  PLT 453* 401* 694   Basic Metabolic Panel: Recent Labs  Lab 11/23/20 1900 11/24/20 0417  11/25/20 0417  NA 123* 126* 131*  K 2.8* 3.4* 3.6  CL 84* 90* 97*  CO2 29 25 25   GLUCOSE 111* 87 81  BUN 47* 39* 27*  CREATININE 3.57* 3.24* 2.57*  CALCIUM 8.7* 8.2* 8.6*  MG  --   --  2.3   GFR: Estimated Creatinine Clearance: 17.4 mL/min (A) (by C-G formula based on SCr of 2.57 mg/dL (H)). Liver Function Tests: Recent Labs  Lab 11/23/20 1900  AST 15  ALT 11  ALKPHOS 50  BILITOT 0.3  PROT 6.7  ALBUMIN 3.7   No results for input(s): LIPASE, AMYLASE in the last 168 hours. No results for input(s): AMMONIA in the last 168 hours. Coagulation Profile: Recent Labs  Lab 11/24/20 0417  INR 1.4*   Cardiac Enzymes: No results for input(s): CKTOTAL, CKMB, CKMBINDEX, TROPONINI in the last 168 hours. BNP (last 3 results) No results for input(s): PROBNP in the last 8760 hours. HbA1C: No results for input(s): HGBA1C in the last 72 hours. CBG: Recent Labs  Lab 11/23/20 1858  GLUCAP 102*   Lipid Profile: No results for input(s): CHOL, HDL, LDLCALC, TRIG, CHOLHDL, LDLDIRECT in the last 72 hours. Thyroid Function Tests: No results for input(s): TSH, T4TOTAL, FREET4, T3FREE, THYROIDAB in the last 72 hours. Anemia Panel:  No results for input(s): VITAMINB12, FOLATE, FERRITIN, TIBC, IRON, RETICCTPCT in the last 72 hours. Sepsis Labs: No results for input(s): PROCALCITON, LATICACIDVEN in the last 168 hours.  Recent Results (from the past 240 hour(s))  Resp Panel by RT-PCR (Flu A&B, Covid) Nasopharyngeal Swab     Status: None   Collection Time: 11/23/20  7:16 PM   Specimen: Nasopharyngeal Swab; Nasopharyngeal(NP) swabs in vial transport medium  Result Value Ref Range Status   SARS Coronavirus 2 by RT PCR NEGATIVE NEGATIVE Final    Comment: (NOTE) SARS-CoV-2 target nucleic acids are NOT DETECTED.  The SARS-CoV-2 RNA is generally detectable in upper respiratory specimens during the acute phase of infection. The lowest concentration of SARS-CoV-2 viral copies this assay can  detect is 138 copies/mL. A negative result does not preclude SARS-Cov-2 infection and should not be used as the sole basis for treatment or other patient management decisions. A negative result may occur with  improper specimen collection/handling, submission of specimen other than nasopharyngeal swab, presence of viral mutation(s) within the areas targeted by this assay, and inadequate number of viral copies(<138 copies/mL). A negative result must be combined with clinical observations, patient history, and epidemiological information. The expected result is Negative.  Fact Sheet for Patients:  EntrepreneurPulse.com.au  Fact Sheet for Healthcare Providers:  IncredibleEmployment.be  This test is no t yet approved or cleared by the [place] FDA and  has been authorized for detection and/or diagnosis of SARS-CoV-2 by FDA under an Emergency Use Authorization (EUA). This EUA will remain  in effect (meaning this test can be used) for the duration of the COVID-19 declaration under Section 564(b)(1) of the Act, 21 U.S.C.section 360bbb-3(b)(1), unless the authorization is terminated  or revoked sooner.       Influenza A by PCR NEGATIVE NEGATIVE Final   Influenza B by PCR NEGATIVE NEGATIVE Final    Comment: (NOTE) The Xpert Xpress SARS-CoV-2/FLU/RSV plus assay is intended as an aid in the diagnosis of influenza from Nasopharyngeal swab specimens and should not be used as a sole basis for treatment. Nasal washings and aspirates are unacceptable for Xpert Xpress SARS-CoV-2/FLU/RSV testing.  Fact Sheet for Patients: EntrepreneurPulse.com.au  Fact Sheet for Healthcare Providers: IncredibleEmployment.be  This test is not yet approved or cleared by the [place] FDA and has been authorized for detection and/or diagnosis of SARS-CoV-2 by FDA under an Emergency Use Authorization (EUA). This EUA will remain in  effect (meaning this test can be used) for the duration of the COVID-19 declaration under Section 564(b)(1) of the Act, 21 U.S.C. section 360bbb-3(b)(1), unless the authorization is terminated or revoked.  Performed at [facility], [address] [place], [place] [address]          Radiology Studies: DG Chest Portable 1 View  Result Date: 11/23/2020 CLINICAL DATA:  Weakness, shortness of breath EXAM: PORTABLE CHEST 1 VIEW COMPARISON:  10/21/2020 FINDINGS: Single frontal view of the chest demonstrates an unremarkable cardiac silhouette. No acute airspace disease, effusion, or pneumothorax. No acute bony abnormalities. IMPRESSION: 1. Stable exam, no acute process. Electronically Signed   By: Randa Ngo M.D.   On: 11/23/2020 19:34   DG Foot Complete Right  Result Date: 11/23/2020 CLINICAL DATA:  Fifth toe infection EXAM: RIGHT FOOT COMPLETE - 3+ VIEW COMPARISON:  11/18/2020 FINDINGS: No definitive fracture is seen. Stable calcifications are noted along the fifth toe laterally similar to that seen on the prior exam. No definitive erosive changes are seen. Tarsal degenerative changes and calcaneal spurring  is seen. No soft tissue abnormality is noted. 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 No definitive bony erosive changes are noted. Electronically Signed   By: Inez Catalina M.D.   On: 11/23/2020 20:30        Scheduled Meds: . flecainide  50 mg Oral BID  . gabapentin  300 mg Oral TID  . ketamine HCl      . lubiprostone  24 mcg Oral BID  . pantoprazole (PROTONIX) IV  40 mg Intravenous Q12H  . sulfamethoxazole-trimethoprim  1 tablet Oral BID    LOS: 1 day    Time spent: 30 minutes    Maciah Feeback Darleen Crocker, DO [facility]  If 7PM-7AM, please contact night-coverage [email or web address] 11/25/2020, 3:23 PM

## 2020-11-25 NOTE — Anesthesia Preprocedure Evaluation (Signed)
Anesthesia Evaluation  Patient identified by MRN, date of birth, ID band Patient awake    Reviewed: Allergy & Precautions, H&P , NPO status , Patient's Chart, lab work & pertinent test results, reviewed documented beta blocker date and time   Airway Mallampati: II  TM Distance: >3 FB Neck ROM: full    Dental no notable dental hx.    Pulmonary sleep apnea , former smoker,    Pulmonary exam normal breath sounds clear to auscultation       Cardiovascular Exercise Tolerance: Good hypertension, negative cardio ROS  Atrial Fibrillation  Rhythm:regular Rate:Normal     Neuro/Psych negative neurological ROS  negative psych ROS   GI/Hepatic Neg liver ROS, PUD, GERD  ,  Endo/Other  negative endocrine ROS  Renal/GU negative Renal ROS  negative genitourinary   Musculoskeletal   Abdominal   Peds  Hematology  (+) Blood dyscrasia, anemia ,   Anesthesia Other Findings   Reproductive/Obstetrics negative OB ROS                             Anesthesia Physical Anesthesia Plan  ASA: III  Anesthesia Plan: General   Post-op Pain Management:    Induction:   PONV Risk Score and Plan: Propofol infusion  Airway Management Planned:   Additional Equipment:   Intra-op Plan:   Post-operative Plan:   Informed Consent: I have reviewed the patients History and Physical, chart, labs and discussed the procedure including the risks, benefits and alternatives for the proposed anesthesia with the patient or authorized representative who has indicated his/her understanding and acceptance.     Dental Advisory Given  Plan Discussed with: CRNA  Anesthesia Plan Comments:         Anesthesia Quick Evaluation

## 2020-11-25 NOTE — Op Note (Addendum)
[facility] Patient Name: [name] [name] Procedure Date: 11/25/2020 12:32 PM MRN: [id number] Date of Birth: November 24, 1946 Attending MD: Norvel Richards , MD CSN: [id number] Age: 74 Admit Type: Inpatient Procedure:                Upper GI endoscopy Indications:              Melena Providers:                Norvel Richards, MD, Lambert Mody, Tammy                            Vaught, RN, Aram Candela Referring MD:              Medicines:                Propofol per Anesthesia Complications:            No immediate complications. Estimated Blood Loss:     Estimated blood loss: none. Procedure:                Pre-Anesthesia Assessment:                           - Prior to the procedure, a History and Physical                            was performed, and patient medications and                            allergies were reviewed. The patient's tolerance of                            previous anesthesia was also reviewed. The risks                            and benefits of the procedure and the sedation                            options and risks were discussed with the patient.                            All questions were answered, and informed consent                            was obtained. Prior Anticoagulants: The patient                            last took Eliquis (apixaban) 2 days prior to the                            procedure. ASA Grade Assessment: III - A patient                            with severe systemic disease. After reviewing the  risks and benefits, the patient was deemed in                            satisfactory condition to undergo the procedure.                           After obtaining informed consent, the endoscope was                            passed under direct vision. Throughout the                            procedure, the patient's blood pressure, pulse, and                            oxygen saturations were  monitored continuously. The                            [id number]-[id number] ([id number]) scope was introduced through the                            mouth, and advanced to the third part of duodenum.                            The upper GI endoscopy was accomplished without                            difficulty. The patient tolerated the procedure                            well. Scope In: 12:58:50 PM Scope Out: 1:07:22 PM Total Procedure Duration: 0 hours 8 minutes 32 seconds  Findings:      The esophagus appeared normal      The entire examined stomach was normal.      The duodenal bulb, second portion of the duodenum and third portion of       the duodenum were inspected; single 3 mm lymphangiectitic lesion in the       second portion of the duodenum; otherwise no abnormality seen. There is       no blood in the upper GI tract. Scope withdrawn, the the small bowel       capsule deployment device was loaded up and the capsule scope was       introduced into the stomach and the capsule advanced across the pylorus       and deployed. Impression:               - Normal stomach.                           - Normal duodenal bulb, second portion of the                            duodenum and third portion of the duodenum                            (  lymphangiectasia). No bleeding seen in the upper                            GI tract.                           - No specimens collected. Small bowel capsule                            deployment. Moderate Sedation:      Moderate (conscious) sedation was personally administered by an       anesthesia professional. The following parameters were monitored: oxygen       saturation, heart rate, blood pressure, respiratory rate, EKG, adequacy       of pulmonary ventilation, and response to care. Recommendation:           - Return patient to hospital ward for observation.                           - Clear liquid diet getting it 1600.                           -  Continue present medications. Capsule data to be                            reviewed as it becomes available. At patient                            request, I called Diane at [phone] and                            reviewed findings and recommendations. Procedure Code(s):        --- Professional ---                           [phone], Esophagogastroduodenoscopy, flexible,                            transoral; diagnostic, including collection of                            specimen(s) by brushing or washing, when performed                            (separate procedure) Diagnosis Code(s):        --- Professional ---                           K92.1, Melena (includes Hematochezia) CPT copyright 2019 American Medical Association. All rights reserved. The codes documented in this report are preliminary and upon coder review may  be revised to meet current compliance requirements. Cristopher Estimable. Shey Yott, MD Norvel Richards, MD 11/25/2020 1:21:55 PM This report has been signed electronically. Number of Addenda: 0

## 2020-11-25 NOTE — Anesthesia Postprocedure Evaluation (Signed)
Anesthesia Post Note  Patient: [name] [name]  Procedure(s) Performed: ESOPHAGOGASTRODUODENOSCOPY (EGD) WITH PROPOFOL (N/A ) GIVENS CAPSULE STUDY (N/A )  Patient location during evaluation: Endoscopy Anesthesia Type: General Level of consciousness: awake and patient cooperative Pain management: pain level controlled Vital Signs Assessment: post-procedure vital signs reviewed and stable Respiratory status: spontaneous breathing Cardiovascular status: stable Postop Assessment: no apparent nausea or vomiting Anesthetic complications: no   No complications documented.   Last Vitals:  Vitals:   11/25/20 0730 11/25/20 1155  BP: (!) 142/51 (!) 165/64  Pulse: 71 87  Resp: 18 16  Temp: 36.8 C 37.2 C  SpO2: 100% 98%    Last Pain:  Vitals:   11/25/20 1300  TempSrc:   PainSc: 0-No pain                 Everette Rank

## 2020-11-26 DIAGNOSIS — I890 Lymphedema, not elsewhere classified: Secondary | ICD-10-CM

## 2020-11-26 DIAGNOSIS — D649 Anemia, unspecified: Secondary | ICD-10-CM | POA: Diagnosis not present

## 2020-11-26 DIAGNOSIS — K5521 Angiodysplasia of colon with hemorrhage: Secondary | ICD-10-CM

## 2020-11-26 DIAGNOSIS — D509 Iron deficiency anemia, unspecified: Secondary | ICD-10-CM

## 2020-11-26 LAB — CBC
HCT: 23.7 % — ABNORMAL LOW (ref 36.0–46.0)
Hemoglobin: 7.1 g/dL — ABNORMAL LOW (ref 12.0–15.0)
MCH: 26.6 pg (ref 26.0–34.0)
MCHC: 30 g/dL (ref 30.0–36.0)
MCV: 88.8 fL (ref 80.0–100.0)
Platelets: 332 10*3/uL (ref 150–400)
RBC: 2.67 MIL/uL — ABNORMAL LOW (ref 3.87–5.11)
RDW: 16 % — ABNORMAL HIGH (ref 11.5–15.5)
WBC: 8.3 10*3/uL (ref 4.0–10.5)
nRBC: 0 % (ref 0.0–0.2)

## 2020-11-26 LAB — BASIC METABOLIC PANEL
Anion gap: 8 (ref 5–15)
BUN: 21 mg/dL (ref 8–23)
CO2: 25 mmol/L (ref 22–32)
Calcium: 8.7 mg/dL — ABNORMAL LOW (ref 8.9–10.3)
Chloride: 98 mmol/L (ref 98–111)
Creatinine, Ser: 2.53 mg/dL — ABNORMAL HIGH (ref 0.44–1.00)
GFR, Estimated: 19 mL/min — ABNORMAL LOW (ref >60)
Glucose, Bld: 88 mg/dL (ref 70–99)
Potassium: 3.7 mmol/L (ref 3.5–5.1)
Sodium: 131 mmol/L — ABNORMAL LOW (ref 135–145)

## 2020-11-26 LAB — HEMOGLOBIN AND HEMATOCRIT, BLOOD
HCT: 28.1 % — ABNORMAL LOW (ref 36.0–46.0)
Hemoglobin: 8.7 g/dL — ABNORMAL LOW (ref 12.0–15.0)

## 2020-11-26 LAB — PREPARE RBC (CROSSMATCH)

## 2020-11-26 LAB — MAGNESIUM: Magnesium: 2.1 mg/dL (ref 1.7–2.4)

## 2020-11-26 MED ORDER — LOSARTAN POTASSIUM 25 MG PO TABS
12.5000 mg | ORAL_TABLET | Freq: Every day | ORAL | Status: DC
  Administered 2020-11-26 – 2020-11-28 (×3): 12.5 mg via ORAL
  Filled 2020-11-26 (×3): qty 1

## 2020-11-26 MED ORDER — SODIUM CHLORIDE 0.9% IV SOLUTION: Freq: Once | INTRAVENOUS | Status: AC

## 2020-11-26 MED ORDER — PANTOPRAZOLE SODIUM 40 MG IV SOLR
40.0000 mg | INTRAVENOUS | Status: DC
  Administered 2020-11-27 – 2020-11-28 (×2): 40 mg via INTRAVENOUS
  Filled 2020-11-26 (×2): qty 40

## 2020-11-26 NOTE — Procedures (Addendum)
Small Bowel Givens Capsule Study Procedure date:  11/25/2020  Referring Provider:  Rodena Goldmann, DO PCP:  Dr. 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 There was evidence of large amount of fresh blood in the lumen in the proximal small bowel (22:50) with scant amount of hematin in the duodenum (04:29). 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  First Gastric image:  00:02:11 First Duodenal image: 00:04:01 First Cecal image: 04:00:30 Gastric Passage time: 0h 23m Small Bowel Passage time:  03h 46m      Summary & Recommendations: - Proceed with push enteroscopy under propofol sedation tomorrow for AVM ablation - Keep Hb >8.0, transfuse one unit PRBC today and as needed - Hold Eliquis for now - Can stop omeprazole unless taking it for any other reason different from GI bleeding.  I personally communicated these recommendations to the patient  [name] Peppers, MD Gastroenterology and Hepatology [facility]

## 2020-11-26 NOTE — Progress Notes (Signed)
[name] [name], M.D. Gastroenterology & Hepatology   Interval History:  No acute events overnight. Patient denies having any melena, hematochezia, hematemesis, nausea, vomiting, abdominal pain or distention.  Noted to have slow drop in her hemoglobin to 7.1.  Patient states feeling well and tolerating diet adequately. Patient underwent capsule endoscopy deployment yesterday after EGD was performed (no presence of alterations in esophagogastroduodenospy explain her melena).  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 There was evidence of large amount of fresh blood in the lumen in the proximal small bowel with scant amount of hematin in the duodenum.  Inpatient Medications:  Current Facility-Administered Medications:  .  acetaminophen (TYLENOL) tablet 650 mg, 650 mg, Oral, Q6H PRN, Manuella Ghazi, Pratik D, DO .  albuterol (VENTOLIN HFA) 108 (90 Base) MCG/ACT inhaler 1-2 puff, 1-2 puff, Inhalation, Q6H PRN, Reubin Milan, MD .  flecainide [facility]) tablet 50 mg, 50 mg, Oral, BID, Reubin Milan, MD, 50 mg at 11/26/20 0848 .  gabapentin (NEURONTIN) capsule 300 mg, 300 mg, Oral, TID, Reubin Milan, MD, 300 mg at 11/26/20 0848 .  lubiprostone (AMITIZA) capsule 24 mcg, 24 mcg, Oral, BID, Reubin Milan, MD, 24 mcg at 11/26/20 0848 .  ondansetron (ZOFRAN) tablet 4 mg, 4 mg, Oral, Q6H PRN **OR** ondansetron (ZOFRAN) injection 4 mg, 4 mg, Intravenous, Q6H PRN, Reubin Milan, MD .  pantoprazole (PROTONIX) injection 40 mg, 40 mg, Intravenous, Q12H, Reubin Milan, MD, 40 mg at 11/26/20 0848 .  sulfamethoxazole-trimethoprim (BACTRIM DS) 800-160 MG per tablet 1 tablet, 1 tablet, Oral, BID, Reubin Milan, MD, 1 tablet at 11/26/20 0848   I/O    Intake/Output Summary (Last 24 hours) at 11/26/2020 1137 Last data filed at 11/26/2020 1110 Gross per 24 hour  Intake 738.67 ml  Output --  Net 738.67 ml      Physical Exam: Temp:  [97.7 F (36.5 C)-99.4 F (37.4 C)] 98.3 F (36.8 C) (12/04 1125) Pulse Rate:  [68-97] 68 (12/04 1125) Resp:  [13-18] 16 (12/04 1125) BP: (117-165)/(43-64) 140/52 (12/04 1125) SpO2:  [96 %-100 %] 100 % (12/04 1125) Weight:  [68.5 kg] 68.5 kg (12/03 1155)  Temp (24hrs), Avg:98.4 F (36.9 C), Min:97.7 F (36.5 C), Max:99.4 F (37.4 C) GENERAL: The patient is AO x3, in no acute distress. HEENT: Head is normocephalic and atraumatic. EOMI are intact. Mouth is well hydrated and without lesions. NECK: Supple. No masses LUNGS: Clear to auscultation. No presence of rhonchi/wheezing/rales. Adequate chest expansion HEART: RRR, normal s1 and s2. ABDOMEN: Soft, nontender, no guarding, no peritoneal signs, and nondistended. BS +. No masses. EXTREMITIES: Without any cyanosis, clubbing, rash, lesions or edema. NEUROLOGIC: AOx3, no focal motor deficit. SKIN: no jaundice, no rashes  Laboratory Data: CBC:     Component Value Date/Time   WBC 8.3 11/26/2020 0539   RBC 2.67 (L) 11/26/2020 0539   HGB 7.1 (L) 11/26/2020 0539   HCT 23.7 (L) 11/26/2020 0539   PLT 332 11/26/2020 0539   MCV 88.8 11/26/2020 0539   MCH 26.6 11/26/2020 0539   MCHC 30.0 11/26/2020 0539   RDW 16.0 (H) 11/26/2020 0539   LYMPHSABS 3.3 11/23/2020 1900   MONOABS 0.7 11/23/2020 1900   EOSABS 0.3 11/23/2020 1900   BASOSABS 0.0 11/23/2020 1900   COAG:  Lab Results  Component Value Date   INR 1.4 (H) 11/24/2020   INR 1.8 (H) 10/20/2020    BMP:  BMP Latest Ref Rng &  Units 11/26/2020 11/25/2020 11/24/2020  Glucose 70 - 99 mg/dL 88 81 87  BUN 8 - 23 mg/dL 21 27(H) 39(H)  Creatinine 0.44 - 1.00 mg/dL 2.53(H) 2.57(H) 3.24(H)  Sodium 135 - 145 mmol/L 131(L) 131(L) 126(L)  Potassium 3.5 - 5.1 mmol/L 3.7 3.6 3.4(L)  Chloride 98 - 111 mmol/L 98 97(L) 90(L)  CO2 22 - 32 mmol/L 25 25 25   Calcium 8.9 - 10.3 mg/dL 8.7(L) 8.6(L) 8.2(L)    HEPATIC:  Hepatic Function Latest Ref Rng & Units 11/23/2020  10/24/2020 10/23/2020  Total Protein 6.5 - 8.1 g/dL 6.7 - -  Albumin 3.5 - 5.0 g/dL 3.7 3.5 3.3(L)  AST 15 - 41 U/L 15 - -  ALT 0 - 44 U/L 11 - -  Alk Phosphatase 38 - 126 U/L 50 - -  Total Bilirubin 0.3 - 1.2 mg/dL 0.3 - -    CARDIAC:  Lab Results  Component Value Date   TROPONINI <0.03 05/06/2019      Imaging: I personally reviewed and interpreted the available labs, imaging and endoscopic files.   Assessment/Plan: 74 year old female with past medical history of atrial fibrillation on Eliquis, hypertension, grade 2 diastolic heart failure, GERD, hyperlipidemia, OSA and previous history of peptic ulcer disease, who was admitted to the hospital after presenting worsening anemia.  Patient was found to have initial hemoglobin of 6.8 (had a drop from 12.1 during June 2021).  Patient underwent a recent colonoscopy, found to have 9 polyps but no presence of any ulcerations that would explain her anemia.  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  Due to this, we will proceed with push enteroscopy tomorrow for ablation of AVMs, which was discussed with the patient who understood and agreed.  We will recommend transfusing 1 unit PRBC and keeping her hemoglobin above 8.0.  # Small bowel bleeding # Small bowel AVMs - Proceed with push enteroscopy under propofol sedation tomorrow for AVM ablation - Keep Hb >8.0, transfuse one unit PRBC today and as needed - Hold Eliquis for now - Can stop omeprazole unless taking it for any other reason different from GI bleeding.  [name] Peppers, MD Gastroenterology and Hepatology [facility]  Note: Occasional unusual wording and randomly placed punctuation marks may result from the use of speech recognition technology to transcribe this document

## 2020-11-26 NOTE — Progress Notes (Signed)
PROGRESS NOTE    AZIZI BALLY  PXT:[id number] DOB: 03-09-1946 DOA: 11/23/2020 PCP: Andres Shad, MD   Brief Narrative:  Per HPI: Harbour Nordmeyer Priceis a 74 y.o.femalewith medical history significant ofgout, grade 2 diastolic dysfunction, hyperlipidemia, hypertension, hypokalemia, OSA not on CPAP, paroxysmal atrial fibrillation, history of neurocardiogenic syncope, constipation, GERD, peptic ulcer disease who is referred by her PCP, Dr. Theador Hawthorne, to the emergency department for evaluation of progressively worse weakness and fatigue for the past 5 days. Her hemoglobin level was found to have a decrease from 9.6 g/dL 33 days ago to 6.8 g/dL today. The patient is currently being treated for right fifth toe infection with Bactrim twice daily by her PCP. She denies fever, chills or night sweats. No rhinorrhea, sore throat, but complains of occasional cough associated with wheezing and dyspnea. She denies chest pain, palpitations, diaphoresis, PND, orthopnea or recent pitting edema of the lower extremities. No abdominal pain, nausea, emesis, diarrhea or hematochezia. She is not 100% sure about having melena recently and she gets occasionally constipated. She denies dysuria, frequency or hematuria. No polyuria, polydipsia, polyphagia or blurred vision.  -Patient was admitted with symptomatic anemia in the setting of positive stool occult and also noted AKI on CKD. She has received 1 unit of PRBCs and has undergone EGD on 12/3 with capsule endoscopy revealing bleeding AVMS. Plans for push enteroscopy in am.  Assessment & Plan:   Principal Problem:   Symptomatic anemia Active Problems:   Essential hypertension, benign   Hypokalemia   GERD (gastroesophageal reflux disease)   Hyperlipidemia   Gout   Paroxysmal atrial fibrillation (HCC)   Near syncope   OSA (obstructive sleep apnea)   AKI (acute kidney injury) (HCC)   Microcytic anemia   Grade II diastolic dysfunction    Hyponatremia   Acute blood loss anemia   Symptomatic anemia-improving and stable Status post 1 unit PRBC with further 1U PRBC ordered 12/4 Monitor hematocrit and hemoglobin. N.p.o. for now and advance diet per GI GIrecommending push ablation on 12/5; npo after MN  Microcytic anemia-downtrending Infuse another unit of PRBCs today  AKIon CKD stage IV with baseline creatinine 2.3-2.8-improved Hold diuretics. Resume ARB. Maintain on IV fluid with normal saline Avoid nephrotoxic medications. Monitor intake and output. Monitor renal function electrolytes.  Hyponatremia-stable Hold furosemide. Follow-up sodium level.  Paroxysmal atrial fibrillation (HCC) CHA2DS2-VASc score 5 Continue Tambocor 50 mg p.o. twice daily. Hold Eliquis due to acute blood loss anemia Further capsule studies pending  Grade II diastolic dysfunction No signs of decompensation at this time. Diuretics and ARB on hold due to AKI.  Nearsyncope-resolved In the setting of hypovolemia with acute blood loss See nurses notes for further detail. Holding antihypertensives. Improved after PRBC transfusion  Essential hypertension, benign Hold antihypertensives. Monitor blood pressure.  GERD (gastroesophageal reflux disease) On pantoprazole 40 mg IVPB twice daily.  Hyperlipidemia Not taking her simvastatin. Will not restart while in the hospital. Follow-up with PCP.  Gout Not taking colchicine at this time. Follow-up with PCP.  Right toe infection Continue on Bactrim as prescribed No extensive wound noted  OSA (obstructive sleep apnea) Not on CPAP.   DVT prophylaxis:SCDs Code Status:Full code Family Communication:Spoke with sister at bedside 12/4 Disposition Plan: Status is: Inpatient  Remains inpatient appropriate because:IV treatments appropriate due to intensity of illness or inability to take PO and Inpatient level of care appropriate due to  severity of illness   Dispo: The patient is from: Home  Anticipated d/c is to: Home  Anticipated d/c date is: 1 day  Patient currently is not medically stable to d/c.  Patient needs further capsule results prior to discharge.  Consultants:  GI  Procedures:  See below  Antimicrobials:   None  Subjective: Patient seen and evaluated today with no new acute complaints or concerns. No acute concerns or events noted overnight. She is eager to have something to eat.  Objective: Vitals:   11/25/20 2017 11/26/20 0411 11/26/20 1102 11/26/20 1125  BP: (!) 126/51 (!) 117/54 (!) 148/56 (!) 140/52  Pulse: 72 97 79 68  Resp: 18 18 16 16   Temp: 98.4 F (36.9 C) 99.4 F (37.4 C) 98.3 F (36.8 C) 98.3 F (36.8 C)  TempSrc:   Oral Oral  SpO2: 96% 98% 100% 100%  Weight:      Height:        Intake/Output Summary (Last 24 hours) at 11/26/2020 1231 Last data filed at 11/26/2020 1110 Gross per 24 hour  Intake 738.67 ml  Output --  Net 738.67 ml   Filed Weights   11/23/20 1635 11/25/20 1155  Weight: 68.5 kg 68.5 kg    Examination:  General exam: Appears calm and comfortable  Respiratory system: Clear to auscultation. Respiratory effort normal. Cardiovascular system: S1 & S2 heard, RRR.  Gastrointestinal system: Abdomen is nondistended, soft and nontender.  Central nervous system: Alert and awake Extremities: No edema Skin: No rashes, lesions or ulcers Psychiatry: Flat affect    Data Reviewed: I have personally reviewed following labs and imaging studies  CBC: Recent Labs  Lab 11/23/20 1900 11/24/20 0417 11/25/20 0417 11/26/20 0539  WBC 9.1 9.7 7.8 8.3  NEUTROABS 4.8  --   --   --   HGB 6.8* 7.5* 7.8* 7.1*  HCT 22.2* 24.3* 25.6* 23.7*  MCV 83.5 85.3 87.7 88.8  PLT 453* 401* 376 854   Basic Metabolic Panel: Recent Labs  Lab 11/23/20 1900 11/24/20 0417 11/25/20 0417 11/26/20 0539  NA 123* 126* 131* 131*  K 2.8*  3.4* 3.6 3.7  CL 84* 90* 97* 98  CO2 29 25 25 25   GLUCOSE 111* 87 81 88  BUN 47* 39* 27* 21  CREATININE 3.57* 3.24* 2.57* 2.53*  CALCIUM 8.7* 8.2* 8.6* 8.7*  MG  --   --  2.3 2.1   GFR: Estimated Creatinine Clearance: 17.7 mL/min (A) (by C-G formula based on SCr of 2.53 mg/dL (H)). Liver Function Tests: Recent Labs  Lab 11/23/20 1900  AST 15  ALT 11  ALKPHOS 50  BILITOT 0.3  PROT 6.7  ALBUMIN 3.7   No results for input(s): LIPASE, AMYLASE in the last 168 hours. No results for input(s): AMMONIA in the last 168 hours. Coagulation Profile: Recent Labs  Lab 11/24/20 0417  INR 1.4*   Cardiac Enzymes: No results for input(s): CKTOTAL, CKMB, CKMBINDEX, TROPONINI in the last 168 hours. BNP (last 3 results) No results for input(s): PROBNP in the last 8760 hours. HbA1C: No results for input(s): HGBA1C in the last 72 hours. CBG: Recent Labs  Lab 11/23/20 1858  GLUCAP 102*   Lipid Profile: No results for input(s): CHOL, HDL, LDLCALC, TRIG, CHOLHDL, LDLDIRECT in the last 72 hours. Thyroid Function Tests: No results for input(s): TSH, T4TOTAL, FREET4, T3FREE, THYROIDAB in the last 72 hours. Anemia Panel: No results for input(s): VITAMINB12, FOLATE, FERRITIN, TIBC, IRON, RETICCTPCT in the last 72 hours. Sepsis Labs: No results for input(s): PROCALCITON, LATICACIDVEN in the last 168 hours.  Recent Results (from the past 240 hour(s))  Resp Panel by RT-PCR (Flu A&B, Covid) Nasopharyngeal Swab     Status: None   Collection Time: 11/23/20  7:16 PM   Specimen: Nasopharyngeal Swab; Nasopharyngeal(NP) swabs in vial transport medium  Result Value Ref Range Status   SARS Coronavirus 2 by RT PCR NEGATIVE NEGATIVE Final    Comment: (NOTE) SARS-CoV-2 target nucleic acids are NOT DETECTED.  The SARS-CoV-2 RNA is generally detectable in upper respiratory specimens during the acute phase of infection. The lowest concentration of SARS-CoV-2 viral copies this assay can detect is 138  copies/mL. A negative result does not preclude SARS-Cov-2 infection and should not be used as the sole basis for treatment or other patient management decisions. A negative result may occur with  improper specimen collection/handling, submission of specimen other than nasopharyngeal swab, presence of viral mutation(s) within the areas targeted by this assay, and inadequate number of viral copies(<138 copies/mL). A negative result must be combined with clinical observations, patient history, and epidemiological information. The expected result is Negative.  Fact Sheet for Patients:  EntrepreneurPulse.com.au  Fact Sheet for Healthcare Providers:  IncredibleEmployment.be  This test is no t yet approved or cleared by the [place] FDA and  has been authorized for detection and/or diagnosis of SARS-CoV-2 by FDA under an Emergency Use Authorization (EUA). This EUA will remain  in effect (meaning this test can be used) for the duration of the COVID-19 declaration under Section 564(b)(1) of the Act, 21 U.S.C.section 360bbb-3(b)(1), unless the authorization is terminated  or revoked sooner.       Influenza A by PCR NEGATIVE NEGATIVE Final   Influenza B by PCR NEGATIVE NEGATIVE Final    Comment: (NOTE) The Xpert Xpress SARS-CoV-2/FLU/RSV plus assay is intended as an aid in the diagnosis of influenza from Nasopharyngeal swab specimens and should not be used as a sole basis for treatment. Nasal washings and aspirates are unacceptable for Xpert Xpress SARS-CoV-2/FLU/RSV testing.  Fact Sheet for Patients: EntrepreneurPulse.com.au  Fact Sheet for Healthcare Providers: IncredibleEmployment.be  This test is not yet approved or cleared by the [place] FDA and has been authorized for detection and/or diagnosis of SARS-CoV-2 by FDA under an Emergency Use Authorization (EUA). This EUA will remain in effect (meaning  this test can be used) for the duration of the COVID-19 declaration under Section 564(b)(1) of the Act, 21 U.S.C. section 360bbb-3(b)(1), unless the authorization is terminated or revoked.  Performed at [facility], [address], [place], [place] [address]          Radiology Studies: No results found.      Scheduled Meds: . flecainide  50 mg Oral BID  . gabapentin  300 mg Oral TID  . lubiprostone  24 mcg Oral BID  . [START ON 11/27/2020] pantoprazole (PROTONIX) IV  40 mg Intravenous Q24H  . sulfamethoxazole-trimethoprim  1 tablet Oral BID    LOS: 2 days    Time spent: 30 minutes    Meda Dudzinski Darleen Crocker, DO [facility]  If 7PM-7AM, please contact night-coverage [email or web address] 11/26/2020, 12:31 PM

## 2020-11-26 NOTE — Plan of Care (Signed)

## 2020-11-27 ENCOUNTER — Other Ambulatory Visit: Payer: Self-pay

## 2020-11-27 ENCOUNTER — Encounter (HOSPITAL_COMMUNITY)
Admission: EM | Disposition: A | Discharge status: Home / Self Care | Payer: Self-pay | Source: Home / Self Care | Attending: Internal Medicine

## 2020-11-27 ENCOUNTER — Inpatient Hospital Stay (HOSPITAL_COMMUNITY): Payer: Medicare HMO | Admitting: Anesthesiology

## 2020-11-27 ENCOUNTER — Encounter (HOSPITAL_COMMUNITY): Payer: Self-pay | Admitting: Internal Medicine

## 2020-11-27 DIAGNOSIS — D509 Iron deficiency anemia, unspecified: Secondary | ICD-10-CM

## 2020-11-27 DIAGNOSIS — K5520 Angiodysplasia of colon without hemorrhage: Secondary | ICD-10-CM

## 2020-11-27 DIAGNOSIS — D649 Anemia, unspecified: Secondary | ICD-10-CM | POA: Diagnosis not present

## 2020-11-27 DIAGNOSIS — K31819 Angiodysplasia of stomach and duodenum without bleeding: Secondary | ICD-10-CM

## 2020-11-27 HISTORY — PX: ENTEROSCOPY: SHX5533

## 2020-11-27 HISTORY — PX: HOT HEMOSTASIS: SHX5433

## 2020-11-27 LAB — TYPE AND SCREEN
ABO/RH(D): O POS
Antibody Screen: NEGATIVE
Unit division: 0

## 2020-11-27 LAB — BPAM RBC
Blood Product Expiration Date: 202201112359
ISSUE DATE / TIME: 202112041105
Unit Type and Rh: 5100

## 2020-11-27 SURGERY — ENTEROSCOPY
Anesthesia: General

## 2020-11-27 MED ORDER — GLYCOPYRROLATE 0.2 MG/ML IJ SOLN
INTRAMUSCULAR | Status: DC | PRN
  Administered 2020-11-27: .1 mg via INTRAVENOUS

## 2020-11-27 MED ORDER — PROPOFOL 10 MG/ML IV BOLUS
INTRAVENOUS | Status: AC
  Filled 2020-11-27: qty 40

## 2020-11-27 MED ORDER — PROPOFOL 500 MG/50ML IV EMUL
INTRAVENOUS | Status: DC | PRN
  Administered 2020-11-27: 50 ug/kg/min via INTRAVENOUS

## 2020-11-27 MED ORDER — LACTATED RINGERS IV SOLN: INTRAVENOUS | Status: DC | PRN

## 2020-11-27 MED ORDER — SODIUM CHLORIDE 0.9 % IV SOLN: INTRAVENOUS | Status: DC

## 2020-11-27 MED ORDER — GLUCAGON HCL RDNA (DIAGNOSTIC) 1 MG IJ SOLR
INTRAMUSCULAR | Status: AC
  Filled 2020-11-27: qty 1

## 2020-11-27 MED ORDER — PROPOFOL 10 MG/ML IV BOLUS
INTRAVENOUS | Status: DC | PRN
  Administered 2020-11-27: 100 mg via INTRAVENOUS

## 2020-11-27 MED ORDER — GLUCAGON HCL RDNA (DIAGNOSTIC) 1 MG IJ SOLR
INTRAMUSCULAR | Status: DC | PRN
  Administered 2020-11-27: .5 mg via INTRAVENOUS

## 2020-11-27 NOTE — Plan of Care (Signed)
We will proceed with push enteroscopy as scheduled.  I thoroughly discussed with the patient his procedure, including the risks involved. Patient understands what the procedure involves including the benefits and any risks. Patient understands alternatives to the proposed procedure. Risks including (but not limited to) bleeding, tearing of the lining (perforation), rupture of adjacent organs, problems with heart and lung function, infection, and medication reactions. A small percentage of complications may require surgery, hospitalization, repeat endoscopic procedure, and/or transfusion.  Patient understood and agreed.  Daniel Castaneda, MD Gastroenterology and Hepatology [facility]  

## 2020-11-27 NOTE — Op Note (Signed)
[facility] Patient Name: [name] [name] Procedure Date: 11/27/2020 9:16 AM MRN: [id number] Date of Birth: 04/27/1946 Attending MD: Maylon Peppers ,  CSN: [id number] Age: 74 Admit Type: Inpatient Procedure:                Small bowel enteroscopy Indications:              Iron deficiency anemia, Angiodysplasia (of                            intestine) Providers:                Maylon Peppers, Crystal Page, Raphael Gibney,                            Technician Referring MD:              Medicines:                Monitored Anesthesia Care Complications:            No immediate complications. Estimated Blood Loss:     Estimated blood loss: none. Procedure:                Pre-Anesthesia Assessment:                           - Prior to the procedure, a History and Physical                            was performed, and patient medications, allergies                            and sensitivities were reviewed. The patient's                            tolerance of previous anesthesia was reviewed.                           - The risks and benefits of the procedure and the                            sedation options and risks were discussed with the                            patient. All questions were answered and informed                            consent was obtained.                           - ASA Grade Assessment: III - A patient with severe                            systemic disease.                           After obtaining informed consent, the endoscope was  passed under direct vision. Throughout the                            procedure, the patient's blood pressure, pulse, and                            oxygen saturations were monitored continuously. The                            [id number]-[id number] ([id number]) scope was introduced through                            the mouth and advanced to the proximal jejunum. The                            small bowel  enteroscopy was accomplished without                            difficulty. The patient tolerated the procedure                            well. Scope In: 0:93:81 AM Scope Out: 10:14:56 AM Total Procedure Duration: 0 hours 28 minutes 41 seconds  Findings:      The esophagus was normal.      The stomach was normal.      Multiple angiodysplastic lesions with no bleeding were found in the       entire examined duodenum. No blood or hematin was seen in the lumen.       Coagulation for bleeding prevention using argon plasma at 0.3       liters/minute and 20 watts was successful.      A few angiodysplastic lesions with no bleeding were found in the       proximal jejunum. No blood or hematin was seen in the lumen. Coagulation       for bleeding prevention using argon beam at 0.3 liters/minute and 20       watts was successful. Impression:               - Normal esophagus.                           - Normal stomach.                           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 Treated with argon plasma coagulation                            (APC).                           - A few non-bleeding angiodysplastic lesions in the                            jejunum. Treated with argon  beam coagulation.                           - No specimens collected. Moderate Sedation:      Per Anesthesia Care Recommendation:           - Return patient to hospital ward for ongoing care.                           - Resume previous diet.                           - Restart anticoagulation tomorrow. Procedure Code(s):        --- Professional ---                           [phone], GC, Small intestinal endoscopy, enteroscopy                            beyond second portion of duodenum, not including                            ileum; with control of bleeding (eg, injection,                            bipolar cautery, unipolar cautery, laser, heater                             probe, stapler, plasma coagulator) Diagnosis Code(s):        --- Professional ---                           L87.564, Angiodysplasia of stomach and duodenum                            without bleeding                           K55.20, Angiodysplasia of colon without hemorrhage                           D50.9, Iron deficiency anemia, unspecified CPT copyright 2019 American Medical Association. All rights reserved. The codes documented in this report are preliminary and upon coder review may  be revised to meet current compliance requirements. Maylon Peppers, MD Maylon Peppers,  11/27/2020 10:29:50 AM This report has been signed electronically. Number of Addenda: 0

## 2020-11-27 NOTE — Plan of Care (Signed)

## 2020-11-27 NOTE — Anesthesia Postprocedure Evaluation (Signed)
Anesthesia Post Note  Patient: [name] [name]  Procedure(s) Performed: ENTEROSCOPY (N/A ) HOT HEMOSTASIS (ARGON PLASMA COAGULATION/BICAP)  Patient location during evaluation: PACU Anesthesia Type: General Level of consciousness: awake Pain management: pain level controlled Vital Signs Assessment: post-procedure vital signs reviewed and stable Respiratory status: spontaneous breathing Cardiovascular status: blood pressure returned to baseline Anesthetic complications: no   No complications documented.   Last Vitals:  Vitals:   11/27/20 1020 11/27/20 1030  BP: 134/62 134/77  Pulse: 67 67  Resp: 13 15  Temp: 36.5 C   SpO2: 99% 100%    Last Pain:  Vitals:   11/27/20 1020  TempSrc:   PainSc: 0-No pain                 Louann Sjogren

## 2020-11-27 NOTE — Progress Notes (Signed)
PROGRESS NOTE    [name] [name]  FAO:[id number] DOB: [birth year] DOA: 11/23/2020 PCP: Andres Shad, MD   Brief Narrative:   Per HPI: Daryn Pisani Priceis a 74 y.o.femalewith medical history significant ofgout, grade 2 diastolic dysfunction, hyperlipidemia, hypertension, hypokalemia, OSA not on CPAP, paroxysmal atrial fibrillation, history of neurocardiogenic syncope, constipation, GERD, peptic ulcer disease who is referred by her PCP, Dr. Theador Hawthorne, to the emergency department for evaluation of progressively worse weakness and fatigue for the past 5 days. Her hemoglobin level was found to have a decrease from 9.6 g/dL 33 days ago to 6.8 g/dL today. The patient is currently being treated for right fifth toe infection with Bactrim twice daily by her PCP. She denies fever, chills or night sweats. No rhinorrhea, sore throat, but complains of occasional cough associated with wheezing and dyspnea. She denies chest pain, palpitations, diaphoresis, PND, orthopnea or recent pitting edema of the lower extremities. No abdominal pain, nausea, emesis, diarrhea or hematochezia. She is not 100% sure about having melena recently and she gets occasionally constipated. She denies dysuria, frequency or hematuria. No polyuria, polydipsia, polyphagia or blurred vision.  -Patient was admitted with symptomatic anemia in the setting of positive stool occult and also noted AKI on CKD. She has received 1 unit of PRBCsand has undergone EGD on 12/3 with capsule endoscopy revealing bleeding AVMS.  Patient is now status post push enteroscopy.   Assessment & Plan:   Principal Problem:   Symptomatic anemia Active Problems:   Essential hypertension, benign   Hypokalemia   GERD (gastroesophageal reflux disease)   Hyperlipidemia   Gout   Paroxysmal atrial fibrillation (HCC)   Near syncope   OSA (obstructive sleep apnea)   AKI (acute kidney injury) (HCC)   Microcytic anemia   Grade II diastolic  dysfunction   Hyponatremia   Acute blood loss anemia   Symptomatic anemia-improvingand stable Status post 1 unit PRBC with further 1U PRBC ordered 12/4 Monitor hematocrit and hemoglobin. N.p.o. for now and advance diet per GI Patient is status post push ablation today with multiple bleeding AVMs noted, plan to restart anticoagulation in a.m. Diet resumed  Microcytic anemia-stable Monitor trend in a.m.  AKIon CKD stage IV with baseline creatinine 2.3-2.8-improved Hold diuretics. Resume ARB. Maintain on IV fluid with normal saline Avoid nephrotoxic medications. Monitor intake and output. Monitor renal function electrolytes.  Hyponatremia-stable Hold furosemide. Follow-up sodium level.  Paroxysmal atrial fibrillation (HCC) CHA2DS2-VASc score 5 Continue Tambocor 50 mg p.o. twice daily. Hold Eliquis due to acute blood loss anemia Further capsule studies pending  Grade II diastolic dysfunction No signs of decompensation at this time. Diuretics and ARB on hold due to AKI.  Nearsyncope-resolved In the setting of hypovolemiawith acute blood loss See nurses notes for further detail. Holding antihypertensives. Improved after PRBC transfusion  Essential hypertension, benign Hold antihypertensives. Monitor blood pressure.  GERD (gastroesophageal reflux disease) On pantoprazole 40 mg IVPB twice daily.  Hyperlipidemia Not taking her simvastatin. Will not restart while in the hospital. Follow-up with PCP.  Gout Not taking colchicine at this time. Follow-up with PCP.  Right toe infection Continue on Bactrim as prescribed No extensive wound noted  OSA (obstructive sleep apnea) Not on CPAP.   DVT prophylaxis:SCDs Code Status:Full code Family Communication:Spoke with sisterat bedside 12/4 Disposition Plan: Status is: Inpatient  Remains inpatient appropriate because:IV treatments appropriate due to intensity of illness  or inability to take PO and Inpatient level of care appropriate due to severity of illness   Dispo: The  patient is from:Home Anticipated d/c is FU:XNAT Anticipated d/c date is: 1 day Patient currently is not medically stable to d/c.Likely discharge in a.m. if H&H remains stable.  Consultants:  GI  Procedures:  See below  Antimicrobials:   None  Subjective: Patient seen and evaluated today with no new acute complaints or concerns. No acute concerns or events noted overnight.  Objective: Vitals:   11/27/20 0549 11/27/20 0922 11/27/20 1020 11/27/20 1030  BP: (!) 121/55 (!) 164/82 134/62 134/77  Pulse: (!) 56 69 67 67  Resp: 18 (!) 24 13 15   Temp: 98.2 F (36.8 C) 98.8 F (37.1 C) 97.7 F (36.5 C)   TempSrc:  Oral    SpO2: 100% 100% 99% 100%  Weight:  68 kg    Height:  5\' 2"  (1.575 m)      Intake/Output Summary (Last 24 hours) at 11/27/2020 1136 Last data filed at 11/27/2020 1016 Gross per 24 hour  Intake 200 ml  Output --  Net 200 ml   Filed Weights   11/23/20 1635 11/25/20 1155 11/27/20 0922  Weight: 68.5 kg 68.5 kg 68 kg    Examination:  General exam: Appears calm and comfortable  Respiratory system: Clear to auscultation. Respiratory effort normal. Cardiovascular system: S1 & S2 heard, RRR.  Gastrointestinal system: Abdomen is nondistended, soft and nontender.  Central nervous system: Alert and oriented. No focal neurological deficits. Extremities: Symmetric 5 x 5 power. Skin: No rashes, lesions or ulcers Psychiatry: Judgement and insight appear normal. Mood & affect appropriate.     Data Reviewed: I have personally reviewed following labs and imaging studies  CBC: Recent Labs  Lab 11/23/20 1900 11/24/20 0417 11/25/20 0417 11/26/20 0539 11/26/20 1636  WBC 9.1 9.7 7.8 8.3  --   NEUTROABS 4.8  --   --   --   --   HGB 6.8* 7.5* 7.8* 7.1* 8.7*  HCT 22.2* 24.3* 25.6* 23.7* 28.1*  MCV 83.5 85.3  87.7 88.8  --   PLT 453* 401* 376 332  --    Basic Metabolic Panel: Recent Labs  Lab 11/23/20 1900 11/24/20 0417 11/25/20 0417 11/26/20 0539  NA 123* 126* 131* 131*  K 2.8* 3.4* 3.6 3.7  CL 84* 90* 97* 98  CO2 29 25 25 25   GLUCOSE 111* 87 81 88  BUN 47* 39* 27* 21  CREATININE 3.57* 3.24* 2.57* 2.53*  CALCIUM 8.7* 8.2* 8.6* 8.7*  MG  --   --  2.3 2.1   GFR: Estimated Creatinine Clearance: 17.6 mL/min (A) (by C-G formula based on SCr of 2.53 mg/dL (H)). Liver Function Tests: Recent Labs  Lab 11/23/20 1900  AST 15  ALT 11  ALKPHOS 50  BILITOT 0.3  PROT 6.7  ALBUMIN 3.7   No results for input(s): LIPASE, AMYLASE in the last 168 hours. No results for input(s): AMMONIA in the last 168 hours. Coagulation Profile: Recent Labs  Lab 11/24/20 0417  INR 1.4*   Cardiac Enzymes: No results for input(s): CKTOTAL, CKMB, CKMBINDEX, TROPONINI in the last 168 hours. BNP (last 3 results) No results for input(s): PROBNP in the last 8760 hours. HbA1C: No results for input(s): HGBA1C in the last 72 hours. CBG: Recent Labs  Lab 11/23/20 1858  GLUCAP 102*   Lipid Profile: No results for input(s): CHOL, HDL, LDLCALC, TRIG, CHOLHDL, LDLDIRECT in the last 72 hours. Thyroid Function Tests: No results for input(s): TSH, T4TOTAL, FREET4, T3FREE, THYROIDAB in the last 72 hours. Anemia Panel: No results for input(s): VITAMINB12,  FOLATE, FERRITIN, TIBC, IRON, RETICCTPCT in the last 72 hours. Sepsis Labs: No results for input(s): PROCALCITON, LATICACIDVEN in the last 168 hours.  Recent Results (from the past 240 hour(s))  Resp Panel by RT-PCR (Flu A&B, Covid) Nasopharyngeal Swab     Status: None   Collection Time: 11/23/20  7:16 PM   Specimen: Nasopharyngeal Swab; Nasopharyngeal(NP) swabs in vial transport medium  Result Value Ref Range Status   SARS Coronavirus 2 by RT PCR NEGATIVE NEGATIVE Final    Comment: (NOTE) SARS-CoV-2 target nucleic acids are NOT DETECTED.  The SARS-CoV-2  RNA is generally detectable in upper respiratory specimens during the acute phase of infection. The lowest concentration of SARS-CoV-2 viral copies this assay can detect is 138 copies/mL. A negative result does not preclude SARS-Cov-2 infection and should not be used as the sole basis for treatment or other patient management decisions. A negative result may occur with  improper specimen collection/handling, submission of specimen other than nasopharyngeal swab, presence of viral mutation(s) within the areas targeted by this assay, and inadequate number of viral copies(<138 copies/mL). A negative result must be combined with clinical observations, patient history, and epidemiological information. The expected result is Negative.  Fact Sheet for Patients:  EntrepreneurPulse.com.au  Fact Sheet for Healthcare Providers:  IncredibleEmployment.be  This test is no t yet approved or cleared by the [place] FDA and  has been authorized for detection and/or diagnosis of SARS-CoV-2 by FDA under an Emergency Use Authorization (EUA). This EUA will remain  in effect (meaning this test can be used) for the duration of the COVID-19 declaration under Section 564(b)(1) of the Act, 21 U.S.C.section 360bbb-3(b)(1), unless the authorization is terminated  or revoked sooner.       Influenza A by PCR NEGATIVE NEGATIVE Final   Influenza B by PCR NEGATIVE NEGATIVE Final    Comment: (NOTE) The Xpert Xpress SARS-CoV-2/FLU/RSV plus assay is intended as an aid in the diagnosis of influenza from Nasopharyngeal swab specimens and should not be used as a sole basis for treatment. Nasal washings and aspirates are unacceptable for Xpert Xpress SARS-CoV-2/FLU/RSV testing.  Fact Sheet for Patients: EntrepreneurPulse.com.au  Fact Sheet for Healthcare Providers: IncredibleEmployment.be  This test is not yet approved or cleared by the  [place] FDA and has been authorized for detection and/or diagnosis of SARS-CoV-2 by FDA under an Emergency Use Authorization (EUA). This EUA will remain in effect (meaning this test can be used) for the duration of the COVID-19 declaration under Section 564(b)(1) of the Act, 21 U.S.C. section 360bbb-3(b)(1), unless the authorization is terminated or revoked.  Performed at [facility], [address], [place], [place] [address]          Radiology Studies: No results found.      Scheduled Meds: . flecainide  50 mg Oral BID  . gabapentin  300 mg Oral TID  . losartan  12.5 mg Oral Daily  . lubiprostone  24 mcg Oral BID  . pantoprazole (PROTONIX) IV  40 mg Intravenous Q24H  . sulfamethoxazole-trimethoprim  1 tablet Oral BID     LOS: 3 days    Time spent: 30 minutes    Alvita Fana Darleen Crocker, DO [facility]  If 7PM-7AM, please contact night-coverage [email or web address] 11/27/2020, 11:36 AM

## 2020-11-27 NOTE — Anesthesia Preprocedure Evaluation (Signed)
Anesthesia Evaluation  Patient identified by MRN, date of birth, ID band Patient awake    Reviewed: Allergy & Precautions, H&P , NPO status , Patient's Chart, lab work & pertinent test results, reviewed documented beta blocker date and time   Airway Mallampati: II  TM Distance: >3 FB Neck ROM: full    Dental no notable dental hx.    Pulmonary sleep apnea , former smoker,    Pulmonary exam normal breath sounds clear to auscultation       Cardiovascular Exercise Tolerance: Good hypertension, negative cardio ROS   Rhythm:regular Rate:Normal     Neuro/Psych negative neurological ROS  negative psych ROS   GI/Hepatic Neg liver ROS, PUD, GERD  ,  Endo/Other  negative endocrine ROS  Renal/GU negative Renal ROS  negative genitourinary   Musculoskeletal   Abdominal   Peds  Hematology  (+) Blood dyscrasia, anemia ,   Anesthesia Other Findings   Reproductive/Obstetrics negative OB ROS                             Anesthesia Physical  Anesthesia Plan  ASA: III  Anesthesia Plan: General   Post-op Pain Management:    Induction:   PONV Risk Score and Plan: Propofol infusion  Airway Management Planned:   Additional Equipment:   Intra-op Plan:   Post-operative Plan:   Informed Consent: I have reviewed the patients History and Physical, chart, labs and discussed the procedure including the risks, benefits and alternatives for the proposed anesthesia with the patient or authorized representative who has indicated his/her understanding and acceptance.     Dental Advisory Given  Plan Discussed with: CRNA  Anesthesia Plan Comments:         Anesthesia Quick Evaluation

## 2020-11-27 NOTE — Brief Op Note (Signed)
11/23/2020 - 11/27/2020  10:27 AM  PATIENT:  [name] [name]  74 y.o. 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  Patient tolerated well.  Patient was found to have a normal esophagus and stomach.  Duodenum had several AVMs which were small in size, none of them were bleeding.  These lesions were ablated with APC probe successfully.  No hematin was seen in the lumen.  There were also a few AVMs in the proximal jejunum that were ablated as well with APC.  RECOMMENDATIONS: - Return patient to hospital ward for ongoing care.  - Resume previous diet.  - Restart anticoagulation tomorrow.  Maylon Peppers, MD Gastroenterology and Hepatology [facility]

## 2020-11-27 NOTE — Transfer of Care (Signed)
Immediate Anesthesia Transfer of Care Note  Patient: [name] [name]  Procedure(s) Performed: ENTEROSCOPY (N/A ) HOT HEMOSTASIS (ARGON PLASMA COAGULATION/BICAP)  Patient Location: PACU  Anesthesia Type:General  Level of Consciousness: drowsy  Airway & Oxygen Therapy: Patient Spontanous Breathing  Post-op Assessment: Report given to RN and Post -op Vital signs reviewed and stable  Post vital signs: Reviewed and stable  Last Vitals:  Vitals Value Taken Time  BP    Temp    Pulse    Resp    SpO2      Last Pain:  Vitals:   11/27/20 0940  TempSrc:   PainSc: 0-No pain      Patients Stated Pain Goal: 7 (45/99/77 [id number])  Complications: No complications documented.

## 2020-11-28 ENCOUNTER — Other Ambulatory Visit: Payer: Self-pay

## 2020-11-28 ENCOUNTER — Telehealth: Payer: Self-pay | Admitting: Gastroenterology

## 2020-11-28 DIAGNOSIS — D62 Acute posthemorrhagic anemia: Secondary | ICD-10-CM

## 2020-11-28 DIAGNOSIS — D649 Anemia, unspecified: Secondary | ICD-10-CM | POA: Diagnosis not present

## 2020-11-28 LAB — CBC
HCT: 26.5 % — ABNORMAL LOW (ref 36.0–46.0)
Hemoglobin: 8.2 g/dL — ABNORMAL LOW (ref 12.0–15.0)
MCH: 27.6 pg (ref 26.0–34.0)
MCHC: 30.9 g/dL (ref 30.0–36.0)
MCV: 89.2 fL (ref 80.0–100.0)
Platelets: 282 10*3/uL (ref 150–400)
RBC: 2.97 MIL/uL — ABNORMAL LOW (ref 3.87–5.11)
RDW: 15.6 % — ABNORMAL HIGH (ref 11.5–15.5)
WBC: 6.5 10*3/uL (ref 4.0–10.5)
nRBC: 0 % (ref 0.0–0.2)

## 2020-11-28 NOTE — Discharge Summary (Addendum)
Physician Discharge Summary  [name] [name] PNT:[id number] DOB: 05/05/1946 DOA: 11/23/2020  PCP: Andres Shad, MD  Admit date: 11/23/2020  Discharge date: 11/28/2020  Admitted From:Home  Disposition:  Home  Recommendations for Outpatient Follow-up:  1. Follow up with PCP in 1-2 weeks 2. Follow-up with GI as scheduled in 2-4 weeks 3. Repeat CBC in the next couple days as ordered 4. Resume home anticoagulation with Eliquis 5. Remain on PPI daily as prescribed 6. Continue other home medications as prior  Home Health: None  Equipment/Devices: None  Discharge Condition: Stable  CODE STATUS: Full  Diet recommendation: Heart Healthy  Brief/Interim Summary: Per HPI: [name] [name] a 74 y.o.femalewith medical history significant ofgout, grade 2 diastolic dysfunction, hyperlipidemia, hypertension, hypokalemia, OSA not on CPAP, paroxysmal atrial fibrillation, history of neurocardiogenic syncope, constipation, GERD, peptic ulcer disease who is referred by her PCP, Dr. Theador Hawthorne, to the emergency department for evaluation of progressively worse weakness and fatigue for the past 5 days. Her hemoglobin level was found to have a decrease from 9.6 g/dL 33 days ago to 6.8 g/dL today. The patient is currently being treated for right fifth toe infection with Bactrim twice daily by her PCP. She denies fever, chills or night sweats. No rhinorrhea, sore throat, but complains of occasional cough associated with wheezing and dyspnea. She denies chest pain, palpitations, diaphoresis, PND, orthopnea or recent pitting edema of the lower extremities. No abdominal pain, nausea, emesis, diarrhea or hematochezia. She is not 100% sure about having melena recently and she gets occasionally constipated. She denies dysuria, frequency or hematuria. No polyuria, polydipsia, polyphagia or blurred vision.  -Patient was admitted with symptomatic anemia in the setting of positive stool occult and also  noted AKI on CKD. She has received 1 unit of PRBCsand has undergone EGD on 12/3 with capsule endoscopy revealing bleeding AVMS.  Patient is now status post push enteroscopy with ablation of AVMs on 12/5.  She has tolerated the procedure well and is otherwise tolerating her diet with stable hemoglobin levels.  GI recommendations to resume Eliquis on day of discharge which will be ordered.  She will remain on PPI daily and follow-up CBC as ordered as well as have follow-up with GI in the next 2-4 weeks.  Her creatinine levels have improved to 2.5 which is near her baseline and this was last checked on 12/4.  Discharge Diagnoses:  Principal Problem:   Symptomatic anemia Active Problems:   Essential hypertension, benign   Hypokalemia   GERD (gastroesophageal reflux disease)   Hyperlipidemia   Gout   Paroxysmal atrial fibrillation (HCC)   Near syncope   OSA (obstructive sleep apnea)   AKI (acute kidney injury) ([place])   Microcytic anemia   Grade II diastolic dysfunction   Hyponatremia   Acute blood loss anemia  Principal discharge diagnosis: Symptomatic acute blood loss anemia in the setting of AVMs.  Discharge Instructions  Discharge Instructions    Diet - low sodium heart healthy   Complete by: As directed    Increase activity slowly   Complete by: As directed      Allergies as of 11/28/2020      Reactions   Acetaminophen-codeine Hives   Allopurinol Hives   Amlodipine    Felt she retained fluid in her chest    Amoxicillin Hives   Did it involve swelling of the face/tongue/throat, SOB, or low BP? No Did it involve sudden or severe rash/hives, skin peeling, or any reaction on the inside of your mouth  or nose? No Did you need to seek medical attention at a hospital or doctor's office? No When did it last happen?10 + years If all above answers are "NO", may proceed with cephalosporin use.   Doxycycline    " sick and weak"   Tramadol Hives      Medication List    STOP  taking these medications   sulfamethoxazole-trimethoprim 800-160 MG tablet Commonly known as: BACTRIM DS     TAKE these medications   acetaminophen 500 MG tablet Commonly known as: TYLENOL Take 1,000 mg by mouth every 6 (six) hours as needed for moderate pain.   albuterol 108 (90 Base) MCG/ACT inhaler Commonly known as: VENTOLIN HFA Inhale 1-2 puffs into the lungs every 6 (six) hours as needed for wheezing or shortness of breath.   apixaban 5 MG Tabs tablet Commonly known as: ELIQUIS Take 1 tablet (5 mg total) by mouth 2 (two) times daily.   APPLE CIDER VINEGAR PO Take 2 capsules by mouth daily.   cetirizine 10 MG tablet Commonly known as: ZYRTEC Take 10 mg by mouth daily as needed for allergies.   colchicine 0.6 MG tablet Take 0.6 mg by mouth daily.   cyclobenzaprine 5 MG tablet Commonly known as: FLEXERIL Take 5 mg by mouth 3 (three) times daily as needed for muscle spasms.   cyproheptadine 4 MG tablet Commonly known as: PERIACTIN Take 4 mg by mouth 3 (three) times daily as needed for allergies.   diclofenac Sodium 1 % Gel Commonly known as: VOLTAREN Apply 1 application topically 4 (four) times daily as needed (pain).   ELDERBERRY PO Take 2 capsules by mouth daily.   ferrous sulfate 325 (65 FE) MG tablet Take 325 mg by mouth daily with breakfast.   flecainide 50 MG tablet Commonly known as: TAMBOCOR Take 1 tablet (50 mg total) by mouth 2 (two) times daily.   fluticasone 50 MCG/ACT nasal spray Commonly known as: FLONASE Place 1 spray into both nostrils daily as needed for allergies or rhinitis.   Fluticasone-Salmeterol 500-50 MCG/DOSE Aepb Commonly known as: ADVAIR Inhale 1 puff into the lungs every 12 (twelve) hours.   furosemide 40 MG tablet Commonly known as: LASIX Take 1 tablet (40 mg total) by mouth daily.   gabapentin 300 MG capsule Commonly known as: NEURONTIN Take 300 mg by mouth 3 (three) times daily.   hydrALAZINE 50 MG tablet Commonly  known as: APRESOLINE Take 1 tablet (50 mg total) by mouth 3 (three) times daily.   losartan 25 MG tablet Commonly known as: COZAAR Take 0.5 tablets (12.5 mg total) by mouth daily.   lubiprostone 24 MCG capsule Commonly known as: Amitiza Take 1 capsule (24 mcg total) by mouth in the morning and at bedtime.   meclizine 25 MG tablet Commonly known as: ANTIVERT Take 25 mg by mouth 3 (three) times daily as needed for dizziness.   omeprazole 40 MG capsule Commonly known as: PRILOSEC Take 40 mg by mouth daily.   potassium chloride SA 20 MEQ tablet Commonly known as: KLOR-CON Take 20 mEq by mouth in the morning, at noon, and at bedtime.   simvastatin 40 MG tablet Commonly known as: ZOCOR Take 40 mg by mouth daily in the afternoon.   vitamin C 250 MG tablet Commonly known as: ASCORBIC ACID Take 500 mg by mouth daily.   Vitamin D-3 25 MCG (1000 UT) Caps Take 1 capsule by mouth daily.       Follow-up Information    Andres Shad,  MD Follow up in 1 week(s).   Specialty: Family Medicine Contact information: [address] [place] [address] [phone]              Allergies  Allergen Reactions  . Acetaminophen-Codeine Hives  . Allopurinol Hives  . Amlodipine     Felt she retained fluid in her chest   . Amoxicillin Hives    Did it involve swelling of the face/tongue/throat, SOB, or low BP? No Did it involve sudden or severe rash/hives, skin peeling, or any reaction on the inside of your mouth or nose? No Did you need to seek medical attention at a hospital or doctor's office? No When did it last happen?10 + years If all above answers are "NO", may proceed with cephalosporin use.   Marland Kitchen Doxycycline     " sick and weak"  . Tramadol Hives    Consultations:  GI   Procedures/Studies: DG Chest Portable 1 View  Result Date: 11/23/2020 CLINICAL DATA:  Weakness, shortness of breath EXAM: PORTABLE CHEST 1 VIEW COMPARISON:  10/21/2020 FINDINGS:  Single frontal view of the chest demonstrates an unremarkable cardiac silhouette. No acute airspace disease, effusion, or pneumothorax. No acute bony abnormalities. IMPRESSION: 1. Stable exam, no acute process. Electronically Signed   By: Randa Ngo M.D.   On: 11/23/2020 19:34   DG Foot Complete Right  Result Date: 11/23/2020 CLINICAL DATA:  Fifth toe infection EXAM: RIGHT FOOT COMPLETE - 3+ VIEW COMPARISON:  11/18/2020 FINDINGS: No definitive fracture is seen. Stable calcifications are noted along the fifth toe laterally similar to that seen on the prior exam. No definitive erosive changes are seen. Tarsal degenerative changes and calcaneal spurring is seen. No soft tissue abnormality is noted. 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 No definitive bony erosive changes are noted. Electronically Signed   By: Inez Catalina M.D.   On: 11/23/2020 20:30   DG Toe 5th Right  Result Date: 11/18/2020 CLINICAL DATA:  Right toe pain.  No known injury.  History of gout. EXAM: RIGHT FIFTH TOE COMPARISON:  None. FINDINGS: There is no evidence of fracture or dislocation. Joint spaces are preserved. Suspected erosion along the lateral cortex of the fifth toe middle phalanx. There is small mineralized densities within the soft tissues along the lateral aspect of the fifth toe near the proximal interphalangeal joint. IMPRESSION: Suspected erosion along the lateral cortex of the fifth toe middle phalanx. Small mineralized densities within the soft tissues along the lateral aspect of the fifth toe near the proximal interphalangeal joint. 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 Electronically Signed   By: Davina Poke D.O.   On: 11/18/2020 09:45    Discharge Exam: Vitals:   11/27/20 2107 11/28/20 0519  BP: (!) 149/49 128/90  Pulse: 74 71  Resp: 18 17  Temp: 98.9 F (37.2 C) 99.2 F  (37.3 C)  SpO2: 100% 100%   Vitals:   11/27/20 1400 11/27/20 1919 11/27/20 2107 11/28/20 0519  BP: 127/60  (!) 149/49 128/90  Pulse: 61  74 71  Resp: 20  18 17   Temp: (!) 103.8 F (39.9 C) 98.8 F (37.1 C) 98.9 F (37.2 C) 99.2 F (37.3 C)  TempSrc:  Oral Oral Oral  SpO2: 100%  100% 100%  Weight:      Height:        General: Pt is alert, awake, not in acute distress Cardiovascular: RRR, S1/S2 +, no  rubs, no gallops Respiratory: CTA bilaterally, no wheezing, no rhonchi Abdominal: Soft, NT, ND, bowel sounds + Extremities: no edema, no cyanosis    The results of significant diagnostics from this hospitalization (including imaging, microbiology, ancillary and laboratory) are listed below for reference.     Microbiology: Recent Results (from the past 240 hour(s))  Resp Panel by RT-PCR (Flu A&B, Covid) Nasopharyngeal Swab     Status: None   Collection Time: 11/23/20  7:16 PM   Specimen: Nasopharyngeal Swab; Nasopharyngeal(NP) swabs in vial transport medium  Result Value Ref Range Status   SARS Coronavirus 2 by RT PCR NEGATIVE NEGATIVE Final    Comment: (NOTE) SARS-CoV-2 target nucleic acids are NOT DETECTED.  The SARS-CoV-2 RNA is generally detectable in upper respiratory specimens during the acute phase of infection. The lowest concentration of SARS-CoV-2 viral copies this assay can detect is 138 copies/mL. A negative result does not preclude SARS-Cov-2 infection and should not be used as the sole basis for treatment or other patient management decisions. A negative result may occur with  improper specimen collection/handling, submission of specimen other than nasopharyngeal swab, presence of viral mutation(s) within the areas targeted by this assay, and inadequate number of viral copies(<138 copies/mL). A negative result must be combined with clinical observations, patient history, and epidemiological information. The expected result is Negative.  Fact Sheet for  Patients:  EntrepreneurPulse.com.au  Fact Sheet for Healthcare Providers:  IncredibleEmployment.be  This test is no t yet approved or cleared by the [place] FDA and  has been authorized for detection and/or diagnosis of SARS-CoV-2 by FDA under an Emergency Use Authorization (EUA). This EUA will remain  in effect (meaning this test can be used) for the duration of the COVID-19 declaration under Section 564(b)(1) of the Act, 21 U.S.C.section 360bbb-3(b)(1), unless the authorization is terminated  or revoked sooner.       Influenza A by PCR NEGATIVE NEGATIVE Final   Influenza B by PCR NEGATIVE NEGATIVE Final    Comment: (NOTE) The Xpert Xpress SARS-CoV-2/FLU/RSV plus assay is intended as an aid in the diagnosis of influenza from Nasopharyngeal swab specimens and should not be used as a sole basis for treatment. Nasal washings and aspirates are unacceptable for Xpert Xpress SARS-CoV-2/FLU/RSV testing.  Fact Sheet for Patients: EntrepreneurPulse.com.au  Fact Sheet for Healthcare Providers: IncredibleEmployment.be  This test is not yet approved or cleared by the [place] FDA and has been authorized for detection and/or diagnosis of SARS-CoV-2 by FDA under an Emergency Use Authorization (EUA). This EUA will remain in effect (meaning this test can be used) for the duration of the COVID-19 declaration under Section 564(b)(1) of the Act, 21 U.S.C. section 360bbb-3(b)(1), unless the authorization is terminated or revoked.  Performed at [facility], [address] [place], [place] [address]      Labs: BNP (last 3 results) Recent Labs    06/10/20 0208 10/20/20 1853  BNP 198.0* 308.6*   Basic Metabolic Panel: Recent Labs  Lab 11/23/20 1900 11/24/20 0417 11/25/20 0417 11/26/20 0539  NA 123* 126* 131* 131*  K 2.8* 3.4* 3.6 3.7  CL 84* 90* 97* 98  CO2 29 25 25 25   GLUCOSE 111* 87 81 88  BUN  47* 39* 27* 21  CREATININE 3.57* 3.24* 2.57* 2.53*  CALCIUM 8.7* 8.2* 8.6* 8.7*  MG  --   --  2.3 2.1   Liver Function Tests: Recent Labs  Lab 11/23/20 1900  AST 15  ALT 11  ALKPHOS 50  BILITOT  0.3  PROT 6.7  ALBUMIN 3.7   No results for input(s): LIPASE, AMYLASE in the last 168 hours. No results for input(s): AMMONIA in the last 168 hours. CBC: Recent Labs  Lab 11/23/20 1900 11/23/20 1900 11/24/20 0417 11/25/20 0417 11/26/20 0539 11/26/20 1636 11/28/20 0755  WBC 9.1  --  9.7 7.8 8.3  --  6.5  NEUTROABS 4.8  --   --   --   --   --   --   HGB 6.8*   < > 7.5* 7.8* 7.1* 8.7* 8.2*  HCT 22.2*   < > 24.3* 25.6* 23.7* 28.1* 26.5*  MCV 83.5  --  85.3 87.7 88.8  --  89.2  PLT 453*  --  401* 376 332  --  282   < > = values in this interval not displayed.   Cardiac Enzymes: No results for input(s): CKTOTAL, CKMB, CKMBINDEX, TROPONINI in the last 168 hours. BNP: Invalid input(s): POCBNP CBG: Recent Labs  Lab 11/23/20 1858  GLUCAP 102*   D-Dimer No results for input(s): DDIMER in the last 72 hours. Hgb A1c No results for input(s): HGBA1C in the last 72 hours. Lipid Profile No results for input(s): CHOL, HDL, LDLCALC, TRIG, CHOLHDL, LDLDIRECT in the last 72 hours. Thyroid function studies No results for input(s): TSH, T4TOTAL, T3FREE, THYROIDAB in the last 72 hours.  Invalid input(s): FREET3 Anemia work up No results for input(s): VITAMINB12, FOLATE, FERRITIN, TIBC, IRON, RETICCTPCT in the last 72 hours. Urinalysis    Component Value Date/Time   COLORURINE STRAW (A) 11/23/2020 2015   APPEARANCEUR CLEAR 11/23/2020 2015   LABSPEC 1.008 11/23/2020 2015   PHURINE 6.0 11/23/2020 2015   GLUCOSEU NEGATIVE 11/23/2020 2015   HGBUR SMALL (A) 11/23/2020 2015   BILIRUBINUR NEGATIVE 11/23/2020 2015   KETONESUR NEGATIVE 11/23/2020 2015   PROTEINUR NEGATIVE 11/23/2020 2015   NITRITE NEGATIVE 11/23/2020 2015   LEUKOCYTESUR NEGATIVE 11/23/2020 2015   Sepsis Labs Invalid  input(s): PROCALCITONIN,  WBC,  LACTICIDVEN Microbiology Recent Results (from the past 240 hour(s))  Resp Panel by RT-PCR (Flu A&B, Covid) Nasopharyngeal Swab     Status: None   Collection Time: 11/23/20  7:16 PM   Specimen: Nasopharyngeal Swab; Nasopharyngeal(NP) swabs in vial transport medium  Result Value Ref Range Status   SARS Coronavirus 2 by RT PCR NEGATIVE NEGATIVE Final    Comment: (NOTE) SARS-CoV-2 target nucleic acids are NOT DETECTED.  The SARS-CoV-2 RNA is generally detectable in upper respiratory specimens during the acute phase of infection. The lowest concentration of SARS-CoV-2 viral copies this assay can detect is 138 copies/mL. A negative result does not preclude SARS-Cov-2 infection and should not be used as the sole basis for treatment or other patient management decisions. A negative result may occur with  improper specimen collection/handling, submission of specimen other than nasopharyngeal swab, presence of viral mutation(s) within the areas targeted by this assay, and inadequate number of viral copies(<138 copies/mL). A negative result must be combined with clinical observations, patient history, and epidemiological information. The expected result is Negative.  Fact Sheet for Patients:  EntrepreneurPulse.com.au  Fact Sheet for Healthcare Providers:  IncredibleEmployment.be  This test is no t yet approved or cleared by the [place] FDA and  has been authorized for detection and/or diagnosis of SARS-CoV-2 by FDA under an Emergency Use Authorization (EUA). This EUA will remain  in effect (meaning this test can be used) for the duration of the COVID-19 declaration under Section 564(b)(1) of the Act, 21 U.S.C.section  360bbb-3(b)(1), unless the authorization is terminated  or revoked sooner.       Influenza A by PCR NEGATIVE NEGATIVE Final   Influenza B by PCR NEGATIVE NEGATIVE Final    Comment: (NOTE) The Xpert  Xpress SARS-CoV-2/FLU/RSV plus assay is intended as an aid in the diagnosis of influenza from Nasopharyngeal swab specimens and should not be used as a sole basis for treatment. Nasal washings and aspirates are unacceptable for Xpert Xpress SARS-CoV-2/FLU/RSV testing.  Fact Sheet for Patients: EntrepreneurPulse.com.au  Fact Sheet for Healthcare Providers: IncredibleEmployment.be  This test is not yet approved or cleared by the [place] FDA and has been authorized for detection and/or diagnosis of SARS-CoV-2 by FDA under an Emergency Use Authorization (EUA). This EUA will remain in effect (meaning this test can be used) for the duration of the COVID-19 declaration under Section 564(b)(1) of the Act, 21 U.S.C. section 360bbb-3(b)(1), unless the authorization is terminated or revoked.  Performed at [facility], [address] [place], [place] [address]      Time coordinating discharge: 35 minutes  SIGNED:   Rodena Goldmann, DO [facility] 11/28/2020, 10:13 AM  If 7PM-7AM, please contact night-coverage [email or web address]

## 2020-11-28 NOTE — Telephone Encounter (Signed)
NOTED

## 2020-11-28 NOTE — Telephone Encounter (Signed)
Thursday should be fine, if we could do it for [facility] and let her know?

## 2020-11-28 NOTE — Telephone Encounter (Signed)
[name] [name], patient has an upcoming appt 12/8 with Dr. Abbey Chatters, but we need to push it out 2-3 weeks for hospital follow-up. Likely going home today.  Dina: please have her complete a CBC on either Wednesday or Thursday as outpatient. Diagnosis: acute blood loss anemia.

## 2020-11-28 NOTE — Telephone Encounter (Signed)
RESCHEDULED TO NEXT OPENING WITH DR. Abbey Chatters AND LEFT HER A MESSAGE

## 2020-11-28 NOTE — Telephone Encounter (Signed)
Phoned and spoke with the pt and she agreed to have blood work done Thursday, December 9th to have it done.

## 2020-11-28 NOTE — Telephone Encounter (Signed)
Noted  

## 2020-11-28 NOTE — Telephone Encounter (Signed)
[name] [name] If the pt is not released today do you want it to be pushed out further. Right now I have it for [facility] on Thursday.

## 2020-11-28 NOTE — Progress Notes (Signed)
Nsg Discharge Note  Admit Date:  11/23/2020 Discharge date: 11/28/2020   [name] [name] to be D/C'd Home per MD order.  AVS completed.  Patient able to verbalize understanding.  Discharge Medication: Allergies as of 11/28/2020      Reactions   Acetaminophen-codeine Hives   Allopurinol Hives   Amlodipine    Felt she retained fluid in her chest    Amoxicillin Hives   Did it involve swelling of the face/tongue/throat, SOB, or low BP? No Did it involve sudden or severe rash/hives, skin peeling, or any reaction on the inside of your mouth or nose? No Did you need to seek medical attention at a hospital or doctor's office? No When did it last happen?10 + years If all above answers are "NO", may proceed with cephalosporin use.   Doxycycline    " sick and weak"   Tramadol Hives      Medication List    STOP taking these medications   sulfamethoxazole-trimethoprim 800-160 MG tablet Commonly known as: BACTRIM DS     TAKE these medications   acetaminophen 500 MG tablet Commonly known as: TYLENOL Take 1,000 mg by mouth every 6 (six) hours as needed for moderate pain.   albuterol 108 (90 Base) MCG/ACT inhaler Commonly known as: VENTOLIN HFA Inhale 1-2 puffs into the lungs every 6 (six) hours as needed for wheezing or shortness of breath.   apixaban 5 MG Tabs tablet Commonly known as: ELIQUIS Take 1 tablet (5 mg total) by mouth 2 (two) times daily.   APPLE CIDER VINEGAR PO Take 2 capsules by mouth daily.   cetirizine 10 MG tablet Commonly known as: ZYRTEC Take 10 mg by mouth daily as needed for allergies.   colchicine 0.6 MG tablet Take 0.6 mg by mouth daily.   cyclobenzaprine 5 MG tablet Commonly known as: FLEXERIL Take 5 mg by mouth 3 (three) times daily as needed for muscle spasms.   cyproheptadine 4 MG tablet Commonly known as: PERIACTIN Take 4 mg by mouth 3 (three) times daily as needed for allergies.   diclofenac Sodium 1 % Gel Commonly known as:  VOLTAREN Apply 1 application topically 4 (four) times daily as needed (pain).   ELDERBERRY PO Take 2 capsules by mouth daily.   ferrous sulfate 325 (65 FE) MG tablet Take 325 mg by mouth daily with breakfast.   flecainide 50 MG tablet Commonly known as: TAMBOCOR Take 1 tablet (50 mg total) by mouth 2 (two) times daily.   fluticasone 50 MCG/ACT nasal spray Commonly known as: FLONASE Place 1 spray into both nostrils daily as needed for allergies or rhinitis.   Fluticasone-Salmeterol 500-50 MCG/DOSE Aepb Commonly known as: ADVAIR Inhale 1 puff into the lungs every 12 (twelve) hours.   furosemide 40 MG tablet Commonly known as: LASIX Take 1 tablet (40 mg total) by mouth daily.   gabapentin 300 MG capsule Commonly known as: NEURONTIN Take 300 mg by mouth 3 (three) times daily.   hydrALAZINE 50 MG tablet Commonly known as: APRESOLINE Take 1 tablet (50 mg total) by mouth 3 (three) times daily.   losartan 25 MG tablet Commonly known as: COZAAR Take 0.5 tablets (12.5 mg total) by mouth daily.   lubiprostone 24 MCG capsule Commonly known as: Amitiza Take 1 capsule (24 mcg total) by mouth in the morning and at bedtime.   meclizine 25 MG tablet Commonly known as: ANTIVERT Take 25 mg by mouth 3 (three) times daily as needed for dizziness.   omeprazole 40 MG  capsule Commonly known as: PRILOSEC Take 40 mg by mouth daily.   potassium chloride SA 20 MEQ tablet Commonly known as: KLOR-CON Take 20 mEq by mouth in the morning, at noon, and at bedtime.   simvastatin 40 MG tablet Commonly known as: ZOCOR Take 40 mg by mouth daily in the afternoon.   vitamin C 250 MG tablet Commonly known as: ASCORBIC ACID Take 500 mg by mouth daily.   Vitamin D-3 25 MCG (1000 UT) Caps Take 1 capsule by mouth daily.       Discharge Assessment: Vitals:   11/27/20 2107 11/28/20 0519  BP: (!) 149/49 128/90  Pulse: 74 71  Resp: 18 17  Temp: 98.9 F (37.2 C) 99.2 F (37.3 C)  SpO2:  100% 100%   Skin clean, dry and intact without evidence of skin break down, no evidence of skin tears noted. IV catheter discontinued intact. Site without signs and symptoms of complications - no redness or edema noted at insertion site, patient denies c/o pain - only slight tenderness at site.  Dressing with slight pressure applied.  D/c Instructions-Education: Discharge instructions given to patient with verbalized understanding. D/c education completed with patient/family including follow up instructions, medication list, d/c activities limitations if indicated, with other d/c instructions as indicated by MD - patient able to verbalize understanding, all questions fully answered. Patient instructed to return to ED, call 911, or call MD for any changes in condition.  Patient escorted via [place], and D/C home via private auto.  Reida Hem C, RN 11/28/2020 11:04 AM

## 2020-11-28 NOTE — Progress Notes (Signed)
    Subjective: No abdominal pain, N/V. No overt GI bleeding. Tolerating diet. She is eager to go home. Participating in self-care, ADLs independently.   Objective: Vital signs in last 24 hours: Temp:  [97.7 F (36.5 C)-103.8 F (39.9 C)] 99.2 F (37.3 C) (12/06 0519) Pulse Rate:  [61-74] 71 (12/06 0519) Resp:  [13-20] 17 (12/06 0519) BP: (127-149)/(49-90) 128/90 (12/06 0519) SpO2:  [99 %-100 %] 100 % (12/06 0519) Last BM Date: 11/24/20 General:   Alert and oriented, pleasant Head:  Normocephalic and atraumatic. Abdomen:  Bowel sounds present, soft, non-tender, non-distended. No HSM or hernias noted. No rebound or guarding. No masses appreciated  Msk:  Symmetrical without gross deformities. Normal posture. Extremities:  Without  edema. Neurologic:  Alert and  oriented x4 Psych: Normal mood and affect.  Intake/Output from previous day: 12/05 0701 - 12/06 0700 In: 200 [I.V.:200] Out: -  Intake/Output this shift: No intake/output data recorded.  Lab Results: Recent Labs    11/26/20 0539 11/26/20 1636 11/28/20 0755  WBC 8.3  --  6.5  HGB 7.1* 8.7* 8.2*  HCT 23.7* 28.1* 26.5*  PLT 332  --  282   BMET Recent Labs    11/26/20 0539  NA 131*  K 3.7  CL 98  CO2 25  GLUCOSE 88  BUN 21  CREATININE 2.53*  CALCIUM 8.7*    Assessment: 74 year old female admitted with acute blood loss anemia (Hgb 6.8) , heme positive stool in setting of anticoagulation on Eliquis, s/p EGD this admission, subsequent capsule study, and undergoing enteroscopy 12/5 with multiple duodenal AVMs s/p APC ablation, few AVMs in proximal jejunum also ablated with APC as culprit for GI bleeding. 2 units PRBCs this admission.   Hgb stable at 8.2 this morning. No overt GI bleeding. She is eager to go home and appropriate from a GI standpoint.   As of note, prior colonoscopy March 2021 with multiple polyps removed.     Plan: Resume anticoagulation today PPI daily Appropriate from a GI standpoint  for discharge We will arrange hospital follow-up in 2-4 weeks I am also requesting repeat CBC to be done Wednesday or Thursday this week as outpatient so we may keep close tabs.   Annitta Needs, PhD, ANP-BC [facility]    LOS: 4 days    11/28/2020, 9:24 AM

## 2020-11-29 ENCOUNTER — Encounter (HOSPITAL_COMMUNITY): Payer: Self-pay | Admitting: Internal Medicine

## 2020-11-29 ENCOUNTER — Other Ambulatory Visit: Payer: Self-pay | Admitting: Cardiology

## 2020-11-29 MED ORDER — FLECAINIDE ACETATE 50 MG PO TABS: 50.0000 mg | ORAL_TABLET | Freq: Two times a day (BID) | ORAL | 3 refills | Status: DC

## 2020-11-29 NOTE — Telephone Encounter (Signed)
Refilled per patient request. 

## 2020-11-29 NOTE — Telephone Encounter (Signed)
     1. Which medications need to be refilled? (please list name of each medication and dose if known)  Flecainide 50 mg   2. Which pharmacy/location (including street and city if local pharmacy) is medication to be sent to?  [facility] [address] [place], [place]    3. Do they need a 30 day or 90 day supply?

## 2020-11-30 ENCOUNTER — Ambulatory Visit: Payer: Medicare HMO | Admitting: Internal Medicine

## 2020-12-01 ENCOUNTER — Other Ambulatory Visit (HOSPITAL_COMMUNITY)
Admission: RE | Admit: 2020-12-01 | Discharge: 2020-12-01 | Disposition: A | Discharge status: Home / Self Care | Payer: Medicare HMO | Source: Ambulatory Visit | Attending: Gastroenterology | Admitting: Gastroenterology

## 2020-12-01 ENCOUNTER — Encounter (HOSPITAL_COMMUNITY): Payer: Self-pay | Admitting: Gastroenterology

## 2020-12-01 DIAGNOSIS — D62 Acute posthemorrhagic anemia: Secondary | ICD-10-CM | POA: Diagnosis present

## 2020-12-01 LAB — CBC WITH DIFFERENTIAL/PLATELET
Abs Immature Granulocytes: 0.02 10*3/uL (ref 0.00–0.07)
Basophils Absolute: 0 10*3/uL (ref 0.0–0.1)
Basophils Relative: 1 %
Eosinophils Absolute: 0.6 10*3/uL — ABNORMAL HIGH (ref 0.0–0.5)
Eosinophils Relative: 9 %
HCT: 27.6 % — ABNORMAL LOW (ref 36.0–46.0)
Hemoglobin: 8.3 g/dL — ABNORMAL LOW (ref 12.0–15.0)
Immature Granulocytes: 0 %
Lymphocytes Relative: 29 %
Lymphs Abs: 1.8 10*3/uL (ref 0.7–4.0)
MCH: 27.1 pg (ref 26.0–34.0)
MCHC: 30.1 g/dL (ref 30.0–36.0)
MCV: 90.2 fL (ref 80.0–100.0)
Monocytes Absolute: 0.5 10*3/uL (ref 0.1–1.0)
Monocytes Relative: 8 %
Neutro Abs: 3.2 10*3/uL (ref 1.7–7.7)
Neutrophils Relative %: 53 %
Platelets: 263 10*3/uL (ref 150–400)
RBC: 3.06 MIL/uL — ABNORMAL LOW (ref 3.87–5.11)
RDW: 15.9 % — ABNORMAL HIGH (ref 11.5–15.5)
WBC: 6.2 10*3/uL (ref 4.0–10.5)
nRBC: 0 % (ref 0.0–0.2)

## 2020-12-02 ENCOUNTER — Other Ambulatory Visit: Payer: Self-pay

## 2020-12-02 DIAGNOSIS — D649 Anemia, unspecified: Secondary | ICD-10-CM

## 2020-12-02 DIAGNOSIS — D62 Acute posthemorrhagic anemia: Secondary | ICD-10-CM

## 2020-12-02 NOTE — Progress Notes (Signed)
cbc

## 2020-12-05 ENCOUNTER — Inpatient Hospital Stay (HOSPITAL_COMMUNITY)
Admission: EM | Admit: 2020-12-05 | Discharge: 2020-12-15 | DRG: 252 | Disposition: A | Discharge status: Home / Self Care | Payer: Medicare HMO | Attending: Internal Medicine | Admitting: Internal Medicine

## 2020-12-05 ENCOUNTER — Encounter (HOSPITAL_COMMUNITY): Payer: Self-pay | Admitting: *Deleted

## 2020-12-05 ENCOUNTER — Emergency Department (HOSPITAL_COMMUNITY): Payer: Medicare HMO

## 2020-12-05 ENCOUNTER — Other Ambulatory Visit: Payer: Self-pay

## 2020-12-05 DIAGNOSIS — K922 Gastrointestinal hemorrhage, unspecified: Secondary | ICD-10-CM | POA: Diagnosis present

## 2020-12-05 DIAGNOSIS — Z8711 Personal history of peptic ulcer disease: Secondary | ICD-10-CM

## 2020-12-05 DIAGNOSIS — D62 Acute posthemorrhagic anemia: Secondary | ICD-10-CM | POA: Diagnosis present

## 2020-12-05 DIAGNOSIS — Z79899 Other long term (current) drug therapy: Secondary | ICD-10-CM

## 2020-12-05 DIAGNOSIS — E785 Hyperlipidemia, unspecified: Secondary | ICD-10-CM | POA: Diagnosis present

## 2020-12-05 DIAGNOSIS — I480 Paroxysmal atrial fibrillation: Secondary | ICD-10-CM | POA: Diagnosis not present

## 2020-12-05 DIAGNOSIS — K625 Hemorrhage of anus and rectum: Secondary | ICD-10-CM | POA: Diagnosis present

## 2020-12-05 DIAGNOSIS — F419 Anxiety disorder, unspecified: Secondary | ICD-10-CM | POA: Diagnosis present

## 2020-12-05 DIAGNOSIS — K5521 Angiodysplasia of colon with hemorrhage: Secondary | ICD-10-CM | POA: Diagnosis not present

## 2020-12-05 DIAGNOSIS — Z87891 Personal history of nicotine dependence: Secondary | ICD-10-CM

## 2020-12-05 DIAGNOSIS — I130 Hypertensive heart and chronic kidney disease with heart failure and stage 1 through stage 4 chronic kidney disease, or unspecified chronic kidney disease: Secondary | ICD-10-CM | POA: Diagnosis present

## 2020-12-05 DIAGNOSIS — Z20822 Contact with and (suspected) exposure to covid-19: Secondary | ICD-10-CM | POA: Diagnosis present

## 2020-12-05 DIAGNOSIS — K5909 Other constipation: Secondary | ICD-10-CM | POA: Diagnosis present

## 2020-12-05 DIAGNOSIS — Z8249 Family history of ischemic heart disease and other diseases of the circulatory system: Secondary | ICD-10-CM | POA: Diagnosis not present

## 2020-12-05 DIAGNOSIS — D631 Anemia in chronic kidney disease: Secondary | ICD-10-CM | POA: Diagnosis present

## 2020-12-05 DIAGNOSIS — R55 Syncope and collapse: Secondary | ICD-10-CM | POA: Diagnosis present

## 2020-12-05 DIAGNOSIS — K219 Gastro-esophageal reflux disease without esophagitis: Secondary | ICD-10-CM | POA: Diagnosis present

## 2020-12-05 DIAGNOSIS — E11621 Type 2 diabetes mellitus with foot ulcer: Secondary | ICD-10-CM | POA: Diagnosis present

## 2020-12-05 DIAGNOSIS — I5032 Chronic diastolic (congestive) heart failure: Secondary | ICD-10-CM | POA: Diagnosis present

## 2020-12-05 DIAGNOSIS — E441 Mild protein-calorie malnutrition: Secondary | ICD-10-CM | POA: Diagnosis present

## 2020-12-05 DIAGNOSIS — J449 Chronic obstructive pulmonary disease, unspecified: Secondary | ICD-10-CM | POA: Diagnosis present

## 2020-12-05 DIAGNOSIS — N179 Acute kidney failure, unspecified: Secondary | ICD-10-CM | POA: Diagnosis not present

## 2020-12-05 DIAGNOSIS — E876 Hypokalemia: Secondary | ICD-10-CM | POA: Diagnosis present

## 2020-12-05 DIAGNOSIS — Z881 Allergy status to other antibiotic agents status: Secondary | ICD-10-CM | POA: Diagnosis not present

## 2020-12-05 DIAGNOSIS — Z7951 Long term (current) use of inhaled steroids: Secondary | ICD-10-CM | POA: Diagnosis not present

## 2020-12-05 DIAGNOSIS — Z823 Family history of stroke: Secondary | ICD-10-CM

## 2020-12-05 DIAGNOSIS — N184 Chronic kidney disease, stage 4 (severe): Secondary | ICD-10-CM | POA: Diagnosis present

## 2020-12-05 DIAGNOSIS — Z880 Allergy status to penicillin: Secondary | ICD-10-CM | POA: Diagnosis not present

## 2020-12-05 DIAGNOSIS — L97519 Non-pressure chronic ulcer of other part of right foot with unspecified severity: Secondary | ICD-10-CM | POA: Diagnosis present

## 2020-12-05 DIAGNOSIS — E1152 Type 2 diabetes mellitus with diabetic peripheral angiopathy with gangrene: Secondary | ICD-10-CM | POA: Diagnosis not present

## 2020-12-05 DIAGNOSIS — Z888 Allergy status to other drugs, medicaments and biological substances status: Secondary | ICD-10-CM

## 2020-12-05 DIAGNOSIS — I96 Gangrene, not elsewhere classified: Secondary | ICD-10-CM | POA: Diagnosis not present

## 2020-12-05 DIAGNOSIS — Z885 Allergy status to narcotic agent status: Secondary | ICD-10-CM | POA: Diagnosis not present

## 2020-12-05 DIAGNOSIS — Z9841 Cataract extraction status, right eye: Secondary | ICD-10-CM

## 2020-12-05 DIAGNOSIS — Z9842 Cataract extraction status, left eye: Secondary | ICD-10-CM

## 2020-12-05 DIAGNOSIS — E1122 Type 2 diabetes mellitus with diabetic chronic kidney disease: Secondary | ICD-10-CM | POA: Diagnosis present

## 2020-12-05 DIAGNOSIS — E871 Hypo-osmolality and hyponatremia: Secondary | ICD-10-CM | POA: Diagnosis not present

## 2020-12-05 DIAGNOSIS — Z7901 Long term (current) use of anticoagulants: Secondary | ICD-10-CM

## 2020-12-05 DIAGNOSIS — Z90710 Acquired absence of both cervix and uterus: Secondary | ICD-10-CM

## 2020-12-05 DIAGNOSIS — D649 Anemia, unspecified: Secondary | ICD-10-CM | POA: Diagnosis not present

## 2020-12-05 DIAGNOSIS — R195 Other fecal abnormalities: Secondary | ICD-10-CM | POA: Diagnosis not present

## 2020-12-05 DIAGNOSIS — I482 Chronic atrial fibrillation, unspecified: Secondary | ICD-10-CM | POA: Diagnosis not present

## 2020-12-05 DIAGNOSIS — I70261 Atherosclerosis of native arteries of extremities with gangrene, right leg: Secondary | ICD-10-CM | POA: Diagnosis not present

## 2020-12-05 DIAGNOSIS — G4733 Obstructive sleep apnea (adult) (pediatric): Secondary | ICD-10-CM | POA: Diagnosis present

## 2020-12-05 DIAGNOSIS — Z6830 Body mass index (BMI) 30.0-30.9, adult: Secondary | ICD-10-CM

## 2020-12-05 LAB — CBC WITH DIFFERENTIAL/PLATELET
Abs Immature Granulocytes: 0.02 10*3/uL (ref 0.00–0.07)
Basophils Absolute: 0 10*3/uL (ref 0.0–0.1)
Basophils Relative: 1 %
Eosinophils Absolute: 0.4 10*3/uL (ref 0.0–0.5)
Eosinophils Relative: 6 %
HCT: 23.3 % — ABNORMAL LOW (ref 36.0–46.0)
Hemoglobin: 7 g/dL — ABNORMAL LOW (ref 12.0–15.0)
Immature Granulocytes: 0 %
Lymphocytes Relative: 24 %
Lymphs Abs: 1.6 10*3/uL (ref 0.7–4.0)
MCH: 27.1 pg (ref 26.0–34.0)
MCHC: 30 g/dL (ref 30.0–36.0)
MCV: 90.3 fL (ref 80.0–100.0)
Monocytes Absolute: 0.6 10*3/uL (ref 0.1–1.0)
Monocytes Relative: 9 %
Neutro Abs: 4 10*3/uL (ref 1.7–7.7)
Neutrophils Relative %: 60 %
Platelets: 310 10*3/uL (ref 150–400)
RBC: 2.58 MIL/uL — ABNORMAL LOW (ref 3.87–5.11)
RDW: 16.8 % — ABNORMAL HIGH (ref 11.5–15.5)
WBC: 6.7 10*3/uL (ref 4.0–10.5)
nRBC: 0 % (ref 0.0–0.2)

## 2020-12-05 LAB — COMPREHENSIVE METABOLIC PANEL
ALT: 10 U/L (ref 0–44)
AST: 22 U/L (ref 15–41)
Albumin: 3.2 g/dL — ABNORMAL LOW (ref 3.5–5.0)
Alkaline Phosphatase: 62 U/L (ref 38–126)
Anion gap: 10 (ref 5–15)
BUN: 44 mg/dL — ABNORMAL HIGH (ref 8–23)
CO2: 27 mmol/L (ref 22–32)
Calcium: 8.7 mg/dL — ABNORMAL LOW (ref 8.9–10.3)
Chloride: 92 mmol/L — ABNORMAL LOW (ref 98–111)
Creatinine, Ser: 3.89 mg/dL — ABNORMAL HIGH (ref 0.44–1.00)
GFR, Estimated: 12 mL/min — ABNORMAL LOW (ref >60)
Glucose, Bld: 119 mg/dL — ABNORMAL HIGH (ref 70–99)
Potassium: 3.6 mmol/L (ref 3.5–5.1)
Sodium: 129 mmol/L — ABNORMAL LOW (ref 135–145)
Total Bilirubin: 0.3 mg/dL (ref 0.3–1.2)
Total Protein: 6.2 g/dL — ABNORMAL LOW (ref 6.5–8.1)

## 2020-12-05 LAB — RESP PANEL BY RT-PCR (FLU A&B, COVID) ARPGX2
Influenza A by PCR: NEGATIVE
Influenza B by PCR: NEGATIVE
SARS Coronavirus 2 by RT PCR: NEGATIVE

## 2020-12-05 LAB — PREPARE RBC (CROSSMATCH)

## 2020-12-05 LAB — TROPONIN I (HIGH SENSITIVITY)
Troponin I (High Sensitivity): 13 ng/L (ref <18)
Troponin I (High Sensitivity): 12 ng/L (ref <18)

## 2020-12-05 MED ORDER — ACETAMINOPHEN 325 MG PO TABS
650.0000 mg | ORAL_TABLET | Freq: Four times a day (QID) | ORAL | Status: DC | PRN
  Administered 2020-12-06 – 2020-12-12 (×9): 650 mg via ORAL
  Filled 2020-12-05 (×9): qty 2

## 2020-12-05 MED ORDER — SODIUM CHLORIDE 0.9 % IV SOLN
10.0000 mL/h | Freq: Once | INTRAVENOUS | Status: AC
  Administered 2020-12-05: 10 mL/h via INTRAVENOUS

## 2020-12-05 MED ORDER — PANTOPRAZOLE SODIUM 40 MG IV SOLR
40.0000 mg | Freq: Two times a day (BID) | INTRAVENOUS | Status: DC
  Administered 2020-12-06 – 2020-12-07 (×3): 40 mg via INTRAVENOUS
  Filled 2020-12-05 (×3): qty 40

## 2020-12-05 MED ORDER — ONDANSETRON HCL 4 MG/2ML IJ SOLN: 4.0000 mg | Freq: Four times a day (QID) | INTRAMUSCULAR | Status: DC | PRN

## 2020-12-05 MED ORDER — ONDANSETRON HCL 4 MG PO TABS: 4.0000 mg | ORAL_TABLET | Freq: Four times a day (QID) | ORAL | Status: DC | PRN

## 2020-12-05 MED ORDER — PANTOPRAZOLE SODIUM 40 MG IV SOLR
40.0000 mg | Freq: Once | INTRAVENOUS | Status: AC
  Administered 2020-12-05: 40 mg via INTRAVENOUS
  Filled 2020-12-05: qty 40

## 2020-12-05 MED ORDER — FLECAINIDE ACETATE 50 MG PO TABS
50.0000 mg | ORAL_TABLET | Freq: Two times a day (BID) | ORAL | Status: DC
  Administered 2020-12-06 – 2020-12-15 (×17): 50 mg via ORAL
  Filled 2020-12-05 (×22): qty 1

## 2020-12-05 MED ORDER — OXYMETAZOLINE HCL 0.05 % NA SOLN
1.0000 | Freq: Once | NASAL | Status: AC
  Administered 2020-12-05: 1 via NASAL
  Filled 2020-12-05: qty 30

## 2020-12-05 MED ORDER — ALBUTEROL SULFATE HFA 108 (90 BASE) MCG/ACT IN AERS
1.0000 | INHALATION_SPRAY | Freq: Four times a day (QID) | RESPIRATORY_TRACT | Status: DC | PRN
  Administered 2020-12-06: 2 via RESPIRATORY_TRACT
  Filled 2020-12-05: qty 6.7

## 2020-12-05 MED ORDER — SODIUM CHLORIDE 0.9 % IV BOLUS
1000.0000 mL | Freq: Once | INTRAVENOUS | Status: AC
  Administered 2020-12-05: 1000 mL via INTRAVENOUS

## 2020-12-05 MED ORDER — MOMETASONE FURO-FORMOTEROL FUM 200-5 MCG/ACT IN AERO
2.0000 | INHALATION_SPRAY | Freq: Two times a day (BID) | RESPIRATORY_TRACT | Status: DC
  Administered 2020-12-06 – 2020-12-15 (×18): 2 via RESPIRATORY_TRACT
  Filled 2020-12-05 (×3): qty 8.8

## 2020-12-05 MED ORDER — ACETAMINOPHEN 650 MG RE SUPP: 650.0000 mg | Freq: Four times a day (QID) | RECTAL | Status: DC | PRN

## 2020-12-05 NOTE — ED Provider Notes (Signed)
[facility] EMERGENCY DEPARTMENT Provider Note   CSN: [id number] Arrival date & time: 12/05/20  1938     History Chief Complaint  Patient presents with  . Loss of Consciousness    MONTEZ STRYKER is a 74 y.o. female.  Patient had a syncopal episode today and the paramedics arrived her blood pressure was in the 70s.  Patient has a history of GI bleeds from AVMs.  She has also taken Xarelto but has not had it since this morning  The history is provided by the patient and medical records. No language interpreter was used.  Loss of Consciousness Episode history:  Single Most recent episode:  Today Timing:  Intermittent Progression:  Waxing and waning Chronicity:  Recurrent Context: not blood draw   Witnessed: yes   Relieved by:  Nothing Worsened by:  Nothing Ineffective treatments:  None tried Associated symptoms: no chest pain, no headaches and no seizures        Past Medical History:  Diagnosis Date  . Constipation 07/17/2017  . GERD (gastroesophageal reflux disease)   . Gout   . Grade II diastolic dysfunction 81/03/4817  . Hyperlipidemia   . Hypertension   . Hypokalemia   . OSA (obstructive sleep apnea) 08/09/2020  . PAF (paroxysmal atrial fibrillation) ([place])   . PAF (paroxysmal atrial fibrillation) ([place])   . PUD (peptic ulcer disease)   . Syncope    Neurocardiogenic    Patient Active Problem List   Diagnosis Date Noted  . Acute blood loss anemia 11/24/2020  . Heme positive stool   . Iron deficiency anemia due to chronic blood loss   . Symptomatic anemia 11/23/2020  . Grade II diastolic dysfunction 56/31/4970  . Hyponatremia 11/23/2020  . Symptomatic bradycardia 10/20/2020  . AKI (acute kidney injury) ([place]) 10/20/2020  . Microcytic anemia 10/20/2020  . OSA (obstructive sleep apnea) 08/09/2020  . Paroxysmal atrial fibrillation ([place]) 06/15/2020  . Bradycardia with 31-40 beats per minute 06/15/2020  . Snoring 06/15/2020  . Intermittent palpitations  06/15/2020  . Near syncope 06/15/2020  . Hypertension, uncontrolled 06/15/2020  . Atrial fibrillation with RVR ([place]) 05/04/2020  . Hyperlipidemia   . Gout   . Encounter for screening colonoscopy   . Hemorrhoid 12/16/2018  . GERD (gastroesophageal reflux disease) 01/29/2018  . Constipation 07/17/2017  . Rectal bleeding 01/30/2017  . Chest pain 12/09/2013  . Hypokalemia 05/01/2012  . Renal insufficiency 05/01/2012  . Essential hypertension, benign 05/03/2010  . VASOVAGAL SYNCOPE 05/03/2010    Past Surgical History:  Procedure Laterality Date  . ABDOMINAL HYSTERECTOMY    . CATARACT EXTRACTION Bilateral   . COLONOSCOPY N/A 10/07/2014   Dr. Oneida Alar: redundant left colon, moderate sized external hemorrhoids   . COLONOSCOPY N/A 03/11/2020   Procedure: COLONOSCOPY;  Surgeon: Danie Binder, MD;  Location: [facility];  Service: Endoscopy;  Laterality: N/A;  9:30am w/ overtube  . ENTEROSCOPY N/A 11/27/2020   Procedure: ENTEROSCOPY;  Surgeon: Harvel Quale, MD;  Location: [facility];  Service: Gastroenterology;  Laterality: N/A;  . ESOPHAGOGASTRODUODENOSCOPY (EGD) WITH PROPOFOL N/A 11/25/2020   Procedure: ESOPHAGOGASTRODUODENOSCOPY (EGD) WITH PROPOFOL;  Surgeon: Daneil Dolin, MD;  Location: [facility];  Service: Endoscopy;  Laterality: N/A;  . GIVENS CAPSULE STUDY N/A 11/25/2020   Procedure: GIVENS CAPSULE STUDY;  Surgeon: Daneil Dolin, MD;  Location: [facility];  Service: Endoscopy;  Laterality: N/A;  possible capsule if IDA unexplained by EGD findings.   Marland Kitchen HOT HEMOSTASIS  11/27/2020   Procedure:  HOT HEMOSTASIS (ARGON PLASMA COAGULATION/BICAP);  Surgeon: Montez Morita, Quillian Quince, MD;  Location: [facility];  Service: Gastroenterology;;  . LIPOMA RESECTION    . POLYPECTOMY  03/11/2020   Procedure: POLYPECTOMY;  Surgeon: Danie Binder, MD;  Location: [facility];  Service: Endoscopy;;  cecal, transverse, descending, sigmoid     OB History   No obstetric  history on file.     Family History  Problem Relation Age of Onset  . Stroke Mother   . Dementia Father   . Cancer - Other Sister   . Atrial fibrillation Sister   . Heart failure Sister     Social History   Tobacco Use  . Smoking status: Former Smoker    Packs/day: 1.00    Years: 20.00    Pack years: 20.00    Types: Cigarettes    Start date: 03/16/1964    Quit date: 10/07/1997    Years since quitting: 23.1  . Smokeless tobacco: Never Used  Vaping Use  . Vaping Use: Never used  Substance Use Topics  . Alcohol use: No    Alcohol/week: 0.0 standard drinks  . Drug use: No    Home Medications Prior to Admission medications   Medication Sig Start Date End Date Taking? Authorizing Provider  apixaban (ELIQUIS) 5 MG TABS tablet Take 1 tablet (5 mg total) by mouth 2 (two) times daily. 07/25/20 07/20/21 Yes Satira Sark, MD  cetirizine (ZYRTEC) 10 MG tablet Take 10 mg by mouth daily as needed for allergies.   Yes [provider]  Cholecalciferol (VITAMIN D-3) 25 MCG (1000 UT) CAPS Take 1 capsule by mouth daily.   Yes [provider]  diclofenac Sodium (VOLTAREN) 1 % GEL Apply 1 application topically 4 (four) times daily as needed (pain).   Yes [provider]  ELDERBERRY PO Take 2 capsules by mouth daily.    Yes [provider]  ferrous sulfate 325 (65 FE) MG tablet Take 325 mg by mouth daily with breakfast.   Yes [provider]  flecainide (TAMBOCOR) 50 MG tablet Take 1 tablet (50 mg total) by mouth 2 (two) times daily. 11/29/20 12/29/20 Yes Satira Sark, MD  furosemide (LASIX) 40 MG tablet Take 1 tablet (40 mg total) by mouth daily. 10/27/20 01/25/21 Yes Darliss Cheney, MD  gabapentin (NEURONTIN) 300 MG capsule Take 300 mg by mouth 3 (three) times daily. 11/21/20  Yes [provider]  losartan (COZAAR) 25 MG tablet Take 0.5 tablets (12.5 mg total) by mouth daily. 10/27/20  Yes Pahwani, Einar Grad, MD  lubiprostone (AMITIZA) 24 MCG  capsule Take 1 capsule (24 mcg total) by mouth in the morning and at bedtime. 09/15/20 09/15/21 Yes Carver, Elon Alas, DO  omeprazole (PRILOSEC) 40 MG capsule Take 40 mg by mouth daily.     Yes [provider]  potassium chloride SA (KLOR-CON) 20 MEQ tablet Take 20 mEq by mouth in the morning, at noon, and at bedtime.   Yes [provider]  vitamin C (ASCORBIC ACID) 250 MG tablet Take 500 mg by mouth daily.   Yes [provider]  acetaminophen (TYLENOL) 500 MG tablet Take 1,000 mg by mouth every 6 (six) hours as needed for moderate pain.    [provider]  albuterol (VENTOLIN HFA) 108 (90 Base) MCG/ACT inhaler Inhale 1-2 puffs into the lungs every 6 (six) hours as needed for wheezing or shortness of breath.  09/23/20   [provider]  cyclobenzaprine (FLEXERIL) 5 MG tablet Take  5 mg by mouth 3 (three) times daily as needed for muscle spasms. 06/19/20   [provider]  fluticasone (FLONASE) 50 MCG/ACT nasal spray Place 1 spray into both nostrils daily as needed for allergies or rhinitis.    [provider]  Fluticasone-Salmeterol (ADVAIR) 500-50 MCG/DOSE AEPB Inhale 1 puff into the lungs every 12 (twelve) hours.    [provider]  hydrALAZINE (APRESOLINE) 50 MG tablet Take 1 tablet (50 mg total) by mouth 3 (three) times daily. Patient not taking: No sig reported 10/25/20 11/24/20  Darliss Cheney, MD  meclizine (ANTIVERT) 25 MG tablet Take 25 mg by mouth 3 (three) times daily as needed for dizziness.    [provider]    Allergies    Acetaminophen-codeine, Allopurinol, Amlodipine, Amoxicillin, Doxycycline, and Tramadol  Review of Systems   Review of Systems  Constitutional: Negative for appetite change and fatigue.  HENT: Negative for congestion, ear discharge and sinus pressure.   Eyes: Negative for discharge.  Respiratory: Negative for cough.   Cardiovascular: Positive for syncope. Negative for chest pain.   Gastrointestinal: Negative for abdominal pain and diarrhea.  Genitourinary: Negative for frequency and hematuria.  Musculoskeletal: Negative for back pain.  Skin: Negative for rash.  Neurological: Positive for syncope. Negative for seizures and headaches.  Psychiatric/Behavioral: Negative for hallucinations.    Physical Exam Updated Vital Signs BP 140/66   Pulse 79   Temp 98.8 F (37.1 C) (Oral)   Resp 15   SpO2 98%   Physical Exam Vitals reviewed.  Constitutional:      Appearance: She is well-developed.  HENT:     Head: Normocephalic.     Nose: Nose normal.  Eyes:     General: No scleral icterus.    Extraocular Movements: EOM normal.     Conjunctiva/sclera: Conjunctivae normal.  Neck:     Thyroid: No thyromegaly.  Cardiovascular:     Rate and Rhythm: Normal rate and regular rhythm.     Heart sounds: No murmur heard. No friction rub. No gallop.   Pulmonary:     Breath sounds: No stridor. No wheezing or rales.  Chest:     Chest wall: No tenderness.  Abdominal:     General: There is no distension.     Tenderness: There is no abdominal tenderness. There is no rebound.  Genitourinary:    Comments: Rectal exam shows black stool heme positive Musculoskeletal:        General: No edema. Normal range of motion.     Cervical back: Neck supple.  Lymphadenopathy:     Cervical: No cervical adenopathy.  Skin:    Findings: No erythema or rash.  Neurological:     Mental Status: She is alert and oriented to person, place, and time.     Motor: No abnormal muscle tone.     Coordination: Coordination normal.  Psychiatric:        Mood and Affect: Mood and affect normal.        Behavior: Behavior normal.     ED Results / Procedures / Treatments   Labs (all labs ordered are listed, but only abnormal results are displayed) Labs Reviewed  CBC WITH DIFFERENTIAL/PLATELET - Abnormal; Notable for the following components:      Result Value   RBC 2.58 (*)    Hemoglobin 7.0 (*)     HCT 23.3 (*)    RDW 16.8 (*)    All other components within normal limits  COMPREHENSIVE METABOLIC PANEL - Abnormal; Notable for  the following components:   Sodium 129 (*)    Chloride 92 (*)    Glucose, Bld 119 (*)    BUN 44 (*)    Creatinine, Ser 3.89 (*)    Calcium 8.7 (*)    Total Protein 6.2 (*)    Albumin 3.2 (*)    GFR, Estimated 12 (*)    All other components within normal limits  RESP PANEL BY RT-PCR (FLU A&B, COVID) ARPGX2  POC OCCULT BLOOD, ED  TYPE AND SCREEN  PREPARE RBC (CROSSMATCH)  TROPONIN I (HIGH SENSITIVITY)  TROPONIN I (HIGH SENSITIVITY)    EKG None  Radiology DG Chest Port 1 View  Result Date: 12/05/2020 CLINICAL DATA:  Weakness. EXAM: PORTABLE CHEST 1 VIEW COMPARISON:  November 23, 2020. FINDINGS: Stable cardiomediastinal silhouette. No pneumothorax or pleural effusion is noted. Both lungs are clear. The visualized skeletal structures are unremarkable. IMPRESSION: No active disease. Aortic Atherosclerosis (ICD10-I70.0). Electronically Signed   By: Marijo Conception M.D.   On: 12/05/2020 20:27    Procedures Procedures (including critical care time)  Medications Ordered in ED Medications  sodium chloride 0.9 % bolus 1,000 mL (0 mLs Intravenous Stopped 12/05/20 2142)  pantoprazole (PROTONIX) injection 40 mg (40 mg Intravenous Given 12/05/20 2027)  oxymetazoline (AFRIN) 0.05 % nasal spray 1 spray (1 spray Each Nare Given 12/05/20 2048)    ED Course  I have reviewed the triage vital signs and the nursing notes.  Pertinent labs & imaging results that were available during my care of the patient were reviewed by me and considered in my medical decision making (see chart for details). CRITICAL CARE Performed by: Milton Ferguson Total critical care time: 40 minutes Critical care time was exclusive of separately billable procedures and treating other patients. Critical care was necessary to treat or prevent imminent or life-threatening  deterioration. Critical care was time spent personally by me on the following activities: development of treatment plan with patient and/or surrogate as well as nursing, discussions with consultants, evaluation of patient's response to treatment, examination of patient, obtaining history from patient or surrogate, ordering and performing treatments and interventions, ordering and review of laboratory studies, ordering and review of radiographic studies, pulse oximetry and re-evaluation of patient's condition.    MDM Rules/Calculators/A&P                          Patient was a upper GI bleed.  Patient will be transfused and admitted to medicine.  GI will consult.  Patient is to be n.p.o. tonight after midnight and continue Protonix and hold Xarelto Final Clinical Impression(s) / ED Diagnoses Final diagnoses:  None    Rx / DC Orders ED Discharge Orders    None       Milton Ferguson, MD 12/05/20 2206

## 2020-12-05 NOTE — ED Notes (Signed)
Occult stool collected by provider. OCCULT STOOL POSITIVE.

## 2020-12-05 NOTE — ED Triage Notes (Signed)
Pt arrives from home from [facility]. Previous history that causes her to have fainting spells. Felt tired, laid on the couch and fainted. Similar to episodes prior. Responds to verbal. CBG 112, hr 49, bp 79/40, unable to establish IV access. Denies pain. Feels "weak and tired"

## 2020-12-06 ENCOUNTER — Inpatient Hospital Stay (HOSPITAL_COMMUNITY): Payer: Medicare HMO

## 2020-12-06 ENCOUNTER — Encounter (HOSPITAL_COMMUNITY): Payer: Self-pay | Admitting: Family Medicine

## 2020-12-06 DIAGNOSIS — I96 Gangrene, not elsewhere classified: Secondary | ICD-10-CM

## 2020-12-06 DIAGNOSIS — I480 Paroxysmal atrial fibrillation: Secondary | ICD-10-CM

## 2020-12-06 DIAGNOSIS — D649 Anemia, unspecified: Secondary | ICD-10-CM

## 2020-12-06 DIAGNOSIS — K625 Hemorrhage of anus and rectum: Secondary | ICD-10-CM

## 2020-12-06 DIAGNOSIS — E871 Hypo-osmolality and hyponatremia: Secondary | ICD-10-CM

## 2020-12-06 DIAGNOSIS — K922 Gastrointestinal hemorrhage, unspecified: Secondary | ICD-10-CM

## 2020-12-06 DIAGNOSIS — R195 Other fecal abnormalities: Secondary | ICD-10-CM

## 2020-12-06 LAB — COMPREHENSIVE METABOLIC PANEL
ALT: 10 U/L (ref 0–44)
AST: 14 U/L — ABNORMAL LOW (ref 15–41)
Albumin: 3.2 g/dL — ABNORMAL LOW (ref 3.5–5.0)
Alkaline Phosphatase: 59 U/L (ref 38–126)
Anion gap: 8 (ref 5–15)
BUN: 46 mg/dL — ABNORMAL HIGH (ref 8–23)
CO2: 26 mmol/L (ref 22–32)
Calcium: 8.6 mg/dL — ABNORMAL LOW (ref 8.9–10.3)
Chloride: 97 mmol/L — ABNORMAL LOW (ref 98–111)
Creatinine, Ser: 3.79 mg/dL — ABNORMAL HIGH (ref 0.44–1.00)
GFR, Estimated: 12 mL/min — ABNORMAL LOW (ref >60)
Glucose, Bld: 129 mg/dL — ABNORMAL HIGH (ref 70–99)
Potassium: 3.9 mmol/L (ref 3.5–5.1)
Sodium: 131 mmol/L — ABNORMAL LOW (ref 135–145)
Total Bilirubin: 0.6 mg/dL (ref 0.3–1.2)
Total Protein: 5.9 g/dL — ABNORMAL LOW (ref 6.5–8.1)

## 2020-12-06 LAB — CBC
HCT: 25.6 % — ABNORMAL LOW (ref 36.0–46.0)
Hemoglobin: 7.6 g/dL — ABNORMAL LOW (ref 12.0–15.0)
MCH: 26.4 pg (ref 26.0–34.0)
MCHC: 29.7 g/dL — ABNORMAL LOW (ref 30.0–36.0)
MCV: 88.9 fL (ref 80.0–100.0)
Platelets: 281 10*3/uL (ref 150–400)
RBC: 2.88 MIL/uL — ABNORMAL LOW (ref 3.87–5.11)
RDW: 16.6 % — ABNORMAL HIGH (ref 11.5–15.5)
WBC: 6.1 10*3/uL (ref 4.0–10.5)
nRBC: 0 % (ref 0.0–0.2)

## 2020-12-06 LAB — MAGNESIUM: Magnesium: 2.9 mg/dL — ABNORMAL HIGH (ref 1.7–2.4)

## 2020-12-06 LAB — PROTIME-INR
INR: 1.5 — ABNORMAL HIGH (ref 0.8–1.2)
Prothrombin Time: 17.7 seconds — ABNORMAL HIGH (ref 11.4–15.2)

## 2020-12-06 LAB — PREPARE RBC (CROSSMATCH)

## 2020-12-06 MED ORDER — OCTREOTIDE ACETATE 100 MCG/ML IJ SOLN
100.0000 ug | Freq: Two times a day (BID) | INTRAMUSCULAR | Status: DC
  Administered 2020-12-06 – 2020-12-15 (×17): 100 ug via SUBCUTANEOUS
  Filled 2020-12-06 (×33): qty 1

## 2020-12-06 MED ORDER — GABAPENTIN 100 MG PO CAPS
100.0000 mg | ORAL_CAPSULE | Freq: Three times a day (TID) | ORAL | Status: DC | PRN
  Administered 2020-12-06 – 2020-12-12 (×13): 100 mg via ORAL
  Filled 2020-12-06 (×13): qty 1

## 2020-12-06 MED ORDER — LORAZEPAM 0.5 MG PO TABS: 0.5000 mg | ORAL_TABLET | ORAL | Status: DC | PRN

## 2020-12-06 MED ORDER — SODIUM CHLORIDE 0.9 % IV SOLN: INTRAVENOUS | Status: DC

## 2020-12-06 NOTE — H&P (Signed)
TRH H&P    Patient Demographics:    [name] [name], is a 74 y.o. female  MRN: [id number]  DOB - [birth year]  Admit Date - 12/05/2020  Referring MD/NP/PA: Roderic Palau  Outpatient Primary MD for the patient is Andres Shad, MD  Patient coming from: Home  Chief complaint-syncope   HPI:    [name] [name]  is a 74 y.o. female, with history of syncope, paroxysmal atrial fibrillation, hypertension, hyperlipidemia, grade 2 diastolic dysfunction, GERD, and more presents to the ER with a chief complaint of syncope.  Patient reports that she was in her normal state of health except for that she may have felt fatigued for the last couple days.  Her sister and she went out to do some shopping, and when they came back she remembers telling her sister she was feeling tired.  Next thing she knew EMS was trying to wake her up.  Her sister told her she passed out.  Patient does not remember how long she was out and sister did not say.  Patient reports she did not have any preceding factors.  Patient reports in the last couple days when she has been fatigued she has not had any paresthesias, chest pain, lightheadedness, shortness of breath.  She does not think she has had any melena or hematochezia.  She does take an iron pill.  She reports having EGD and colonoscopy this year.  She also had a capsule study.  The studies revealed AVMs.  Patient has no other complaints at this time.   In the ED Temperature 98.8, heart rate 79, respiratory rate 15, blood pressure 140/66 White blood cell count 6.7, hemoglobin 7.0 Chemistry panel reveals a hyponatremia 129 very close to her baseline, AKI with a creatinine of 3.89, albumin 3.2 Negative Covid and flu Chest x-ray shows no active disease Afrin and Protonix were given in the ED ED provider talk to GI who said keep patient n.p.o. give Protonix twice daily and they will see in the  a.m.    Review of systems:    In addition to the HPI above,  No Fever-chills, No Headache, No changes with Vision or hearing, No problems swallowing food or Liquids, No Chest pain, Cough or Shortness of Breath, No Abdominal pain, No Nausea or Vomiting, bowel movements are regular, No Blood in stool or Urine, No dysuria, No new skin rashes or bruises, No new joints pains-aches,  No new weakness, tingling, numbness in any extremity, No recent weight gain or loss, No polyuria, polydypsia or polyphagia, No significant Mental Stressors.  All other systems reviewed and are negative.    Past History of the following :    Past Medical History:  Diagnosis Date  . Constipation 07/17/2017  . GERD (gastroesophageal reflux disease)   . Gout   . Grade II diastolic dysfunction 55/02/7481  . Hyperlipidemia   . Hypertension   . Hypokalemia   . OSA (obstructive sleep apnea) 08/09/2020  . PAF (paroxysmal atrial fibrillation) ([place])   . PAF (paroxysmal atrial fibrillation) ([place])   . PUD (peptic  ulcer disease)   . Syncope    Neurocardiogenic      Past Surgical History:  Procedure Laterality Date  . ABDOMINAL HYSTERECTOMY    . CATARACT EXTRACTION Bilateral   . COLONOSCOPY N/A 10/07/2014   Dr. Oneida Alar: redundant left colon, moderate sized external hemorrhoids   . COLONOSCOPY N/A 03/11/2020   Procedure: COLONOSCOPY;  Surgeon: Danie Binder, MD;  [facility];  Service: Endoscopy;  Laterality: N/A;  9:30am w/ overtube  . ENTEROSCOPY N/A 11/27/2020   Procedure: ENTEROSCOPY;  Surgeon: Harvel Quale, MD;  [facility];  Service: Gastroenterology;  Laterality: N/A;  . ESOPHAGOGASTRODUODENOSCOPY (EGD) WITH PROPOFOL N/A 11/25/2020   Procedure: ESOPHAGOGASTRODUODENOSCOPY (EGD) WITH PROPOFOL;  Surgeon: Daneil Dolin, MD;  [facility];  Service: Endoscopy;  Laterality: N/A;  . GIVENS CAPSULE STUDY N/A 11/25/2020   Procedure: GIVENS CAPSULE STUDY;   Surgeon: Daneil Dolin, MD;  [facility];  Service: Endoscopy;  Laterality: N/A;  possible capsule if IDA unexplained by EGD findings.   Marland Kitchen HOT HEMOSTASIS  11/27/2020   Procedure: HOT HEMOSTASIS (ARGON PLASMA COAGULATION/BICAP);  Surgeon: Montez Morita, Quillian Quince, MD;  [facility];  Service: Gastroenterology;;  . LIPOMA RESECTION    . POLYPECTOMY  03/11/2020   Procedure: POLYPECTOMY;  Surgeon: Danie Binder, MD;  [facility];  Service: Endoscopy;;  cecal, transverse, descending, sigmoid      Social History:      Social History   Tobacco Use  . Smoking status: Former Smoker    Packs/day: 1.00    Years: 20.00    Pack years: 20.00    Types: Cigarettes    Start date: 03/16/1964    Quit date: 10/07/1997    Years since quitting: 23.1  . Smokeless tobacco: Never Used  Substance Use Topics  . Alcohol use: No    Alcohol/week: 0.0 standard drinks       Family History :     Family History  Problem Relation Age of Onset  . Stroke Mother   . Dementia Father   . Cancer - Other Sister   . Atrial fibrillation Sister   . Heart failure Sister       Home Medications:   Prior to Admission medications   Medication Sig Start Date End Date Taking? Authorizing Provider  apixaban (ELIQUIS) 5 MG TABS tablet Take 1 tablet (5 mg total) by mouth 2 (two) times daily. 07/25/20 07/20/21 Yes Satira Sark, MD  cetirizine (ZYRTEC) 10 MG tablet Take 10 mg by mouth daily as needed for allergies.   Yes [provider]  Cholecalciferol (VITAMIN D-3) 25 MCG (1000 UT) CAPS Take 1 capsule by mouth daily.   Yes [provider]  diclofenac Sodium (VOLTAREN) 1 % GEL Apply 1 application topically 4 (four) times daily as needed (pain).   Yes [provider]  ELDERBERRY PO Take 2 capsules by mouth daily.    Yes [provider]  ferrous sulfate 325 (65 FE) MG tablet Take 325 mg by mouth daily with breakfast.   Yes [provider]  flecainide (TAMBOCOR) 50 MG tablet Take 1 tablet (50 mg total) by mouth 2 (two) times daily. 11/29/20 12/29/20 Yes Satira Sark, MD  furosemide (LASIX) 40 MG tablet Take 1 tablet (40 mg total) by mouth daily. 10/27/20 01/25/21 Yes Darliss Cheney, MD  gabapentin (NEURONTIN) 300 MG capsule Take 300 mg by mouth 3 (three) times daily. 11/21/20  Yes [provider]  losartan (COZAAR) 25 MG tablet Take 0.5 tablets (12.5 mg total) by mouth daily. 10/27/20  Yes Pahwani, Einar Grad, MD  lubiprostone (AMITIZA) 24 MCG capsule Take 1 capsule (24 mcg total) by mouth in the morning and at bedtime. 09/15/20 09/15/21 Yes Carver, Elon Alas, DO  omeprazole (PRILOSEC) 40 MG capsule Take 40 mg by mouth daily.     Yes [provider]  potassium chloride SA (KLOR-CON) 20 MEQ tablet Take 20 mEq by mouth in the morning, at noon, and at bedtime.   Yes [provider]  vitamin C (ASCORBIC ACID) 250 MG tablet Take 500 mg by mouth daily.   Yes [provider]  acetaminophen (TYLENOL) 500 MG tablet Take 1,000 mg by mouth every 6 (six) hours as needed for moderate pain.    [provider]  albuterol (VENTOLIN HFA) 108 (90 Base) MCG/ACT inhaler Inhale 1-2 puffs into the lungs every 6 (six) hours as needed for wheezing or shortness of breath.  09/23/20   [provider]  cyclobenzaprine (FLEXERIL) 5 MG tablet Take 5 mg by mouth 3 (three) times daily as needed for muscle spasms. 06/19/20   [provider]  fluticasone (FLONASE) 50 MCG/ACT nasal spray Place 1 spray into both nostrils daily as needed for allergies or rhinitis.    [provider]  Fluticasone-Salmeterol (ADVAIR) 500-50 MCG/DOSE AEPB Inhale 1 puff into the lungs every 12 (twelve) hours.    [provider]  hydrALAZINE (APRESOLINE) 50 MG tablet Take 1 tablet (50 mg total) by mouth 3 (three) times daily. Patient not taking: No sig reported 10/25/20 11/24/20  Darliss Cheney, MD  meclizine (ANTIVERT) 25  MG tablet Take 25 mg by mouth 3 (three) times daily as needed for dizziness.    [provider]     Allergies:     Allergies  Allergen Reactions  . Acetaminophen-Codeine Hives  . Allopurinol Hives  . Amlodipine     Felt she retained fluid in her chest   . Amoxicillin Hives    Did it involve swelling of the face/tongue/throat, SOB, or low BP? No Did it involve sudden or severe rash/hives, skin peeling, or any reaction on the inside of your mouth or nose? No Did you need to seek medical attention at a hospital or doctor's office? No When did it last happen?10 + years If all above answers are "NO", may proceed with cephalosporin use.   Marland Kitchen Doxycycline     " sick and weak"  . Tramadol Hives     Physical Exam:   Vitals  Blood pressure (!) 150/67, pulse (!) 56, temperature 98.6 F (37 C), temperature source Oral, resp. rate 13, SpO2 100 %.  1.  General: Lying supine in bed in no acute distress  2. Psychiatric: Mood and behavior normal for situation, cooperative with exam  3. Neurologic: Face symmetric, speech and language normal, moves all 4 extremities voluntarily  4. HEENMT:  Head is atraumatic, normocephalic, pupils reactive to light, neck is supple, trachea is midline, mucous membranes are moist  5. Respiratory : Lungs are clear to auscultation bilaterally, no wheezes no rhonchi no rales  6. Cardiovascular : Heart rate is normal, rhythm is regular, no murmurs rubs or gallops  7. Gastrointestinal:  Abdomen is soft, nondistended, nontender to palpation  8. Skin:  Skin is warm dry and intact without acute lesions  9.Musculoskeletal:  No calf tenderness, no acute deformity, no peripheral edema    Data Review:    CBC Recent Labs  Lab 12/01/20 1316 12/05/20 2018  WBC 6.2 6.7  HGB 8.3* 7.0*  HCT 27.6* 23.3*  PLT 263 310  MCV 90.2 90.3  MCH 27.1 27.1  MCHC 30.1 30.0  RDW 15.9* 16.8*  LYMPHSABS 1.8 1.6  MONOABS 0.5 0.6  EOSABS 0.6* 0.4   BASOSABS 0.0 0.0   ------------------------------------------------------------------------------------------------------------------  Results for orders placed or performed during the hospital encounter of 12/05/20 (from the past 48 hour(s))  Resp Panel by RT-PCR (Flu A&B, Covid) Nasopharyngeal Swab     Status: None   Collection Time: 12/05/20  8:11 PM   Specimen: Nasopharyngeal Swab; Nasopharyngeal(NP) swabs in vial transport medium  Result Value Ref Range   SARS Coronavirus 2 by RT PCR NEGATIVE NEGATIVE    Comment: (NOTE) SARS-CoV-2 target nucleic acids are NOT DETECTED.  The SARS-CoV-2 RNA is generally detectable in upper respiratory specimens during the acute phase of infection. The lowest concentration of SARS-CoV-2 viral copies this assay can detect is 138 copies/mL. A negative result does not preclude SARS-Cov-2 infection and should not be used as the sole basis for treatment or other patient management decisions. A negative result may occur with  improper specimen collection/handling, submission of specimen other than nasopharyngeal swab, presence of viral mutation(s) within the areas targeted by this assay, and inadequate number of viral copies(<138 copies/mL). A negative result must be combined with clinical observations, patient history, and epidemiological information. The expected result is Negative.  Fact Sheet for Patients:  EntrepreneurPulse.com.au  Fact Sheet for Healthcare Providers:  IncredibleEmployment.be  This test is no t yet approved or cleared by the [place] FDA and  has been authorized for detection and/or diagnosis of SARS-CoV-2 by FDA under an Emergency Use Authorization (EUA). This EUA will remain  in effect (meaning this test can be used) for the duration of the COVID-19 declaration under Section 564(b)(1) of the Act, 21 U.S.C.section 360bbb-3(b)(1), unless the authorization is terminated  or revoked  sooner.       Influenza A by PCR NEGATIVE NEGATIVE   Influenza B by PCR NEGATIVE NEGATIVE    Comment: (NOTE) The Xpert Xpress SARS-CoV-2/FLU/RSV plus assay is intended as an aid in the diagnosis of influenza from Nasopharyngeal swab specimens and should not be used as a sole basis for treatment. Nasal washings and aspirates are unacceptable for Xpert Xpress SARS-CoV-2/FLU/RSV testing.  Fact Sheet for Patients: EntrepreneurPulse.com.au  Fact Sheet for Healthcare Providers: IncredibleEmployment.be  This test is not yet approved or cleared by the [place] FDA and has been authorized for detection and/or diagnosis of SARS-CoV-2 by FDA under an Emergency Use Authorization (EUA). This EUA will remain in effect (meaning this test can be used) for the duration of the COVID-19 declaration under Section 564(b)(1) of the Act, 21 U.S.C. section 360bbb-3(b)(1), unless the authorization is terminated or revoked.  Performed at [facility], [address] [place], [place] [address]   CBC with Differential/Platelet     Status: Abnormal   Collection Time: 12/05/20  8:18 PM  Result Value Ref Range   WBC 6.7 4.0 - 10.5 K/uL   RBC 2.58 (L) 3.87 - 5.11 MIL/uL   Hemoglobin 7.0 (L) 12.0 - 15.0 g/dL   HCT 23.3 (L) 36.0 - 46.0 %   MCV 90.3 80.0 - 100.0 fL   MCH 27.1 26.0 - 34.0 pg   MCHC 30.0 30.0 - 36.0 g/dL   RDW 16.8 (H) 11.5 - 15.5 %   Platelets 310 150 - 400 K/uL   nRBC 0.0 0.0 -  0.2 %   Neutrophils Relative % 60 %   Neutro Abs 4.0 1.7 - 7.7 K/uL   Lymphocytes Relative 24 %   Lymphs Abs 1.6 0.7 - 4.0 K/uL   Monocytes Relative 9 %   Monocytes Absolute 0.6 0.1 - 1.0 K/uL   Eosinophils Relative 6 %   Eosinophils Absolute 0.4 0.0 - 0.5 K/uL   Basophils Relative 1 %   Basophils Absolute 0.0 0.0 - 0.1 K/uL   Immature Granulocytes 0 %   Abs Immature Granulocytes 0.02 0.00 - 0.07 K/uL    Comment: Performed at [facility], [address]  [place], [place] [address]  Comprehensive metabolic panel     Status: Abnormal   Collection Time: 12/05/20  8:18 PM  Result Value Ref Range   Sodium 129 (L) 135 - 145 mmol/L   Potassium 3.6 3.5 - 5.1 mmol/L   Chloride 92 (L) 98 - 111 mmol/L   CO2 27 22 - 32 mmol/L   Glucose, Bld 119 (H) 70 - 99 mg/dL    Comment: Glucose reference range applies only to samples taken after fasting for at least 8 hours.   BUN 44 (H) 8 - 23 mg/dL   Creatinine, Ser 3.89 (H) 0.44 - 1.00 mg/dL   Calcium 8.7 (L) 8.9 - 10.3 mg/dL   Total Protein 6.2 (L) 6.5 - 8.1 g/dL   Albumin 3.2 (L) 3.5 - 5.0 g/dL   AST 22 15 - 41 U/L   ALT 10 0 - 44 U/L   Alkaline Phosphatase 62 38 - 126 U/L   Total Bilirubin 0.3 0.3 - 1.2 mg/dL   GFR, Estimated 12 (L) >60 mL/min    Comment: (NOTE) Calculated using the CKD-EPI Creatinine Equation (2021)    Anion gap 10 5 - 15    Comment: Performed at [facility], [address], [place], [place] [address]  Troponin I (High Sensitivity)     Status: None   Collection Time: 12/05/20  8:18 PM  Result Value Ref Range   Troponin I (High Sensitivity) 13 <18 ng/L    Comment: (NOTE) Elevated high sensitivity troponin I (hsTnI) values and significant  changes across serial measurements may suggest ACS but many other  chronic and acute conditions are known to elevate hsTnI results.  Refer to the "Links" section for chest pain algorithms and additional  guidance. Performed at [facility], [address] [place], [place] [address]   Type and screen     Status: None (Preliminary result)   Collection Time: 12/05/20  8:18 PM  Result Value Ref Range   ABO/RH(D) O POS    Antibody Screen NEG    Sample Expiration 12/08/2020,2359    Unit Number [id number]    Blood Component Type RED CELLS,LR    Unit division 00    Status of Unit ALLOCATED    Transfusion Status OK TO TRANSFUSE    Crossmatch Result Compatible    Unit Number [id number]    Blood Component Type RBC LR PHER1    Unit division 00     Status of Unit ISSUED    Transfusion Status OK TO TRANSFUSE    Crossmatch Result      Compatible Performed at [facility], [address], [place], [place] [address]   Prepare RBC (crossmatch)     Status: None   Collection Time: 12/05/20  8:18 PM  Result Value Ref Range   Order Confirmation      ORDER PROCESSED BY BLOOD BANK Performed at [facility], [address] [place], [place] [address]   Troponin I (High Sensitivity)     Status: None   Collection Time: 12/05/20 10:40 PM  Result Value Ref Range   Troponin I (High Sensitivity) 12 <18 ng/L    Comment: (NOTE) Elevated high sensitivity troponin I (hsTnI) values and significant  changes across serial measurements may suggest ACS but many other  chronic and acute conditions are known to elevate hsTnI results.  Refer to the "Links" section for chest pain algorithms and additional  guidance. Performed at [facility], [address] [place], [place] [address]     Chemistries  Recent Labs  Lab 12/05/20 2018  NA 129*  K 3.6  CL 92*  CO2 27  GLUCOSE 119*  BUN 44*  CREATININE 3.89*  CALCIUM 8.7*  AST 22  ALT 10  ALKPHOS 62  BILITOT 0.3   ------------------------------------------------------------------------------------------------------------------  ------------------------------------------------------------------------------------------------------------------ GFR: Estimated Creatinine Clearance: 11.5 mL/min (A) (by C-G formula based on SCr of 3.89 mg/dL (H)). Liver Function Tests: Recent Labs  Lab 12/05/20 2018  AST 22  ALT 10  ALKPHOS 62  BILITOT 0.3  PROT 6.2*  ALBUMIN 3.2*   No results for input(s): LIPASE, AMYLASE in the last 168 hours. No results for input(s): AMMONIA in the last 168 hours. Coagulation Profile: No results for input(s): INR, PROTIME in the last 168 hours. Cardiac Enzymes: No results for input(s): CKTOTAL, CKMB, CKMBINDEX, TROPONINI in the last 168 hours. BNP (last 3 results) No  results for input(s): PROBNP in the last 8760 hours. HbA1C: No results for input(s): HGBA1C in the last 72 hours. CBG: No results for input(s): GLUCAP in the last 168 hours. Lipid Profile: No results for input(s): CHOL, HDL, LDLCALC, TRIG, CHOLHDL, LDLDIRECT in the last 72 hours. Thyroid Function Tests: No results for input(s): TSH, T4TOTAL, FREET4, T3FREE, THYROIDAB in the last 72 hours. Anemia Panel: No results for input(s): VITAMINB12, FOLATE, FERRITIN, TIBC, IRON, RETICCTPCT in the last 72 hours.  --------------------------------------------------------------------------------------------------------------- Urine analysis:    Component Value Date/Time   COLORURINE STRAW (A) 11/23/2020 2015   APPEARANCEUR CLEAR 11/23/2020 2015   LABSPEC 1.008 11/23/2020 2015   PHURINE 6.0 11/23/2020 2015   GLUCOSEU NEGATIVE 11/23/2020 2015   HGBUR SMALL (A) 11/23/2020 2015   BILIRUBINUR NEGATIVE 11/23/2020 2015   KETONESUR NEGATIVE 11/23/2020 2015   PROTEINUR NEGATIVE 11/23/2020 2015   NITRITE NEGATIVE 11/23/2020 2015   LEUKOCYTESUR NEGATIVE 11/23/2020 2015      Imaging Results:    DG Chest Port 1 View  Result Date: 12/05/2020 CLINICAL DATA:  Weakness. EXAM: PORTABLE CHEST 1 VIEW COMPARISON:  November 23, 2020. FINDINGS: Stable cardiomediastinal silhouette. No pneumothorax or pleural effusion is noted. Both lungs are clear. The visualized skeletal structures are unremarkable. IMPRESSION: No active disease. Aortic Atherosclerosis (ICD10-I70.0). Electronically Signed   By: Marijo Conception M.D.   On: 12/05/2020 20:27     Assessment & Plan:    Principal Problem:   GI bleed Active Problems:   Rectal bleeding   Paroxysmal atrial fibrillation (HCC)   Hyponatremia   1. Symptomatic anemia 1. Hemoglobin 7.0 2. Syncope at home 3. History of GI bleed 4. FOBT positive today 5. GI to see in the a.m. 2. GI bleed 1. See above 2. N.p.o. 3. Protonix twice daily 3. AKI 1. Creatinine 2.53 at  baseline 2. Today 3.9 3. Likely secondary to anemia 4. Trend after blood transfusion 4. Mild protein calorie malnutrition 1. When patient is able to tolerate p.o. encourage nutrient dense food  choices 5. Atrial fibrillation 1. Holding anticoagulation in the setting of GI bleed 2. Continue flecainide 6. Hyponatremia 1. Sodium 129 2. Baseline 131 3. Trend in a.m. 7.    DVT Prophylaxis-  SCDs   AM Labs Ordered, also please review Full Orders  Family Communication: No family at bedside  Code Status: Full  Admission status: Inpatient :The appropriate admission status for this patient is INPATIENT. Inpatient status is judged to be reasonable and necessary in order to provide the required intensity of service to ensure the patient's safety. The patient's presenting symptoms, physical exam findings, and initial radiographic and laboratory data in the context of their chronic comorbidities is felt to place them at high risk for further clinical deterioration. Furthermore, it is not anticipated that the patient will be medically stable for discharge from the hospital within 2 midnights of admission. The following factors support the admission status of inpatient.     The patient's presenting symptoms include syncope The worrisome physical exam findings include bradycardia 45 The initial radiographic and laboratory data are worrisome because of hgb 7.0 The chronic co-morbidities include history of syncope, paroxysmal atrial fibrillation, OSA, DM 2 diastolic dysfunction, GERD, and more       * I certify that at the point of admission it is my clinical judgment that the patient will require inpatient hospital care spanning beyond 2 midnights from the point of admission due to high intensity of service, high risk for further deterioration and high frequency of surveillance required.*  Time spent in minutes : [place]

## 2020-12-06 NOTE — Progress Notes (Signed)
ASSUMPTION OF CARE NOTE   12/06/2020 2:13 PM  [name] [name] was seen and examined.  The H&P by the admitting provider, orders, imaging was reviewed.  Please see new orders.  Will continue to follow.  Pt complained of right toe pain.  Upon examination she appears to have dry gangrene of right 5th toe.  I have ordered MRI right foot to eval for osteomyelitis.    A/P  1. Acute on chronic blood loss anemia - symptomatic - Pt was admitted for GI evaluation.  She is on clears now and IV protonix 40 mg BID.  She is guaiac positive.  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  She has been transfused PRBCs.  2. AKI on CKD stage 3b - Following closely.   Avoid nephrotoxins.  3. Chronic atrial fibrillation - holding apixaban for now.   Flecainide for rate control management.  Discuss watchman with cardiology.  4. Hyponatremia - improved to 131 today, recheck in AM.  5. 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  Further recommendations to follow.    Vitals:   12/06/20 1300 12/06/20 1357  BP: (!) 161/71 (!) 143/84  Pulse: 62 85  Resp: 14 16  Temp:  98.6 F (37 C)  SpO2: 98% 100%    Results for orders placed or performed during the hospital encounter of 12/05/20  Resp Panel by RT-PCR (Flu A&B, Covid) Nasopharyngeal Swab   Specimen: Nasopharyngeal Swab; Nasopharyngeal(NP) swabs in vial transport medium  Result Value Ref Range   SARS Coronavirus 2 by RT PCR NEGATIVE NEGATIVE   Influenza A by PCR NEGATIVE NEGATIVE   Influenza B by PCR NEGATIVE NEGATIVE  CBC with Differential/Platelet  Result Value Ref Range   WBC 6.7 4.0 - 10.5 K/uL   RBC 2.58 (L) 3.87 - 5.11 MIL/uL   Hemoglobin 7.0 (L) 12.0 - 15.0 g/dL   HCT 23.3 (L) 36.0 - 46.0 %   MCV 90.3 80.0 - 100.0 fL   MCH 27.1 26.0 - 34.0 pg   MCHC 30.0 30.0 - 36.0 g/dL   RDW 16.8 (H) 11.5 - 15.5 %   Platelets 310 150 - 400 K/uL   nRBC 0.0 0.0 - 0.2 %   Neutrophils Relative % 60 %   Neutro Abs  4.0 1.7 - 7.7 K/uL   Lymphocytes Relative 24 %   Lymphs Abs 1.6 0.7 - 4.0 K/uL   Monocytes Relative 9 %   Monocytes Absolute 0.6 0.1 - 1.0 K/uL   Eosinophils Relative 6 %   Eosinophils Absolute 0.4 0.0 - 0.5 K/uL   Basophils Relative 1 %   Basophils Absolute 0.0 0.0 - 0.1 K/uL   Immature Granulocytes 0 %   Abs Immature Granulocytes 0.02 0.00 - 0.07 K/uL  Comprehensive metabolic panel  Result Value Ref Range   Sodium 129 (L) 135 - 145 mmol/L   Potassium 3.6 3.5 - 5.1 mmol/L   Chloride 92 (L) 98 - 111 mmol/L   CO2 27 22 - 32 mmol/L   Glucose, Bld 119 (H) 70 - 99 mg/dL   BUN 44 (H) 8 - 23 mg/dL   Creatinine, Ser 3.89 (H) 0.44 - 1.00 mg/dL   Calcium 8.7 (L) 8.9 - 10.3 mg/dL   Total Protein 6.2 (L) 6.5 - 8.1 g/dL   Albumin 3.2 (L) 3.5 - 5.0 g/dL   AST 22 15 - 41 U/L   ALT 10 0 - 44 U/L   Alkaline Phosphatase 62  38 - 126 U/L   Total Bilirubin 0.3 0.3 - 1.2 mg/dL   GFR, Estimated 12 (L) >60 mL/min   Anion gap 10 5 - 15  CBC  Result Value Ref Range   WBC 6.1 4.0 - 10.5 K/uL   RBC 2.88 (L) 3.87 - 5.11 MIL/uL   Hemoglobin 7.6 (L) 12.0 - 15.0 g/dL   HCT 25.6 (L) 36.0 - 46.0 %   MCV 88.9 80.0 - 100.0 fL   MCH 26.4 26.0 - 34.0 pg   MCHC 29.7 (L) 30.0 - 36.0 g/dL   RDW 16.6 (H) 11.5 - 15.5 %   Platelets 281 150 - 400 K/uL   nRBC 0.0 0.0 - 0.2 %  Comprehensive metabolic panel  Result Value Ref Range   Sodium 131 (L) 135 - 145 mmol/L   Potassium 3.9 3.5 - 5.1 mmol/L   Chloride 97 (L) 98 - 111 mmol/L   CO2 26 22 - 32 mmol/L   Glucose, Bld 129 (H) 70 - 99 mg/dL   BUN 46 (H) 8 - 23 mg/dL   Creatinine, Ser 3.79 (H) 0.44 - 1.00 mg/dL   Calcium 8.6 (L) 8.9 - 10.3 mg/dL   Total Protein 5.9 (L) 6.5 - 8.1 g/dL   Albumin 3.2 (L) 3.5 - 5.0 g/dL   AST 14 (L) 15 - 41 U/L   ALT 10 0 - 44 U/L   Alkaline Phosphatase 59 38 - 126 U/L   Total Bilirubin 0.6 0.3 - 1.2 mg/dL   GFR, Estimated 12 (L) >60 mL/min   Anion gap 8 5 - 15  Magnesium  Result Value Ref Range   Magnesium 2.9 (H) 1.7 - 2.4  mg/dL  Protime-INR  Result Value Ref Range   Prothrombin Time 17.7 (H) 11.4 - 15.2 seconds   INR 1.5 (H) 0.8 - 1.2  Type and screen  Result Value Ref Range   ABO/RH(D) O POS    Antibody Screen NEG    Sample Expiration 12/08/2020,2359    Unit Number [id number]    Blood Component Type RED CELLS,LR    Unit division 00    Status of Unit ALLOCATED    Transfusion Status OK TO TRANSFUSE    Crossmatch Result Compatible    Unit Number [id number]    Blood Component Type RBC LR PHER1    Unit division 00    Status of Unit ISSUED,FINAL    Transfusion Status OK TO TRANSFUSE    Crossmatch Result      Compatible Performed at [facility], [address] [place], [place] [address]   Prepare RBC (crossmatch)  Result Value Ref Range   Order Confirmation      ORDER PROCESSED BY BLOOD BANK Performed at [facility], [address], [place], [place] [address]   Prepare RBC (crossmatch)  Result Value Ref Range   Order Confirmation      ORDER PROCESSED BY BLOOD BANK Performed at [facility], [address], [place], [place] [address]   BPAM [facility]  Result Value Ref Range   Blood Product Unit Number [id number]    PRODUCT CODE [id number]    Unit Type and Rh 5100    Blood Product Expiration Date [id number]    ISSUE DATE / TIME [id number]    Blood Product Unit Number [id number]    PRODUCT CODE [id number]    Unit Type and Rh 5100    Blood Product Expiration Date 803212248250   Troponin I (High Sensitivity)  Result Value Ref Range   Troponin I (  High Sensitivity) 13 <18 ng/L  Troponin I (High Sensitivity)  Result Value Ref Range   Troponin I (High Sensitivity) 12 <18 ng/L   Time spent: 35 mins   Murvin Natal, MD [facility]   12/05/2020  7:41 PM How to contact the [facility] Attending or Consulting provider [place] or covering provider during after hours [place], for this patient?  1. Check the care team in [facility] and look for a) attending/consulting TR[facility] provider listed and b) the  [facility] team listed 2. Log into [email or web address] and use [place]'s universal password to access. If you do not have the password, please contact the hospital operator. 3. Locate the [facility] provider you are looking for under [facility] and page to a number that you can be directly reached. 4. If you still have difficulty reaching the provider, please page the [facility] (Director on Call) for the Hospitalists listed on amion for assistance.

## 2020-12-06 NOTE — ED Notes (Signed)
Report called to [phone] 

## 2020-12-06 NOTE — Consult Note (Signed)
@LOGO @   Referring Provider: Milton Ferguson, MD Primary Care Physician:  Andres Shad, MD Primary Gastroenterologist:  Dr. Abbey Chatters  Date of Admission: 12/05/20 Date of Consultation: 12/06/20  Reason for Consultation: Anemia with heme positive stool  HPI:  [name] [name] is a 74 y.o. year old female with history of syncope, paroxysmal atrial fibrillation on Eliquis, hypertension, hyperlipidemia, grade 2 diastolic dysfunction, CKD, GERD, anemia in the setting of chronic disease and GI bleed secondary to [facility]. Recent admission earlier this month with acute blood loss anemia and heme positive stool s/p EGD, capsule study, and undergoing enteroscopy 12/5 for multiple duodenal AVMs s/p APC ablation, few AVMs and proximal jejunum also ablated with APC suspected to be comfort of GI bleeding.  She received 2 units PRBCs during that admission with discharge hemoglobin 8.2. 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  Repeat colonoscopy has been deferred due to age. She presented to the ED with  chief complaint of syncope.   ED course: She remained hemodynamically stable.  Labs remarkable for hemoglobin 7.0 creatinine 3.89 (up from 2.53, 10 days ago), BUN 44, sodium 129 (close to baseline).  She received 1 unit PRBCs, 1L NS bolus, and was started on IV Protonix 40 mg twice daily.  Hemoglobin this morning 7.6.  Today:  Came home from shopping with her sister yesterday and felt tired. Went to lay down on the couch to go to sleep. Not sure if she went to sleep or passed out. Next thing she knew, she woke up in the ambulance. 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 This is the first time it has happened in a while.   No black stools or blood in the stool. States she flushes before she gets up. Hasn't seen anything on the toilet tissue. No abdominal pain. No nausea or vomiting. Has been eating well. No GERD symptoms with omeprazole daily.  No dysaphia.  No constipation or diarrhea.   Eliquis: Last dose yesterday morning.  Iron supplement: Once daily. Just started this about 1 month ago.   PPI: Taking omeprazole 40 mg daily.  NSAIDs: None.   No CP, SOB, palpitations, or SOB. No lightheadedness, dizziness.    Patient reports prior discussion with cardiology with mention of watchman if she could not remain on anticoagulation.  Past Medical History:  Diagnosis Date  . Constipation 07/17/2017  . GERD (gastroesophageal reflux disease)   . Gout   . Grade II diastolic dysfunction 45/0/3888  . Hyperlipidemia   . Hypertension   . Hypokalemia   . OSA (obstructive sleep apnea) 08/09/2020  . PAF (paroxysmal atrial fibrillation) ([place])   . PAF (paroxysmal atrial fibrillation) ([place])   . PUD (peptic ulcer disease)   . Syncope    Neurocardiogenic    Past Surgical History:  Procedure Laterality Date  . ABDOMINAL HYSTERECTOMY    . CATARACT EXTRACTION Bilateral   . COLONOSCOPY N/A 10/07/2014   Dr. Oneida Alar: redundant left colon, moderate sized external hemorrhoids   . COLONOSCOPY N/A 03/11/2020   Procedure: COLONOSCOPY;  Surgeon: Danie Binder, MD;  [facility];  Service: Endoscopy;  Laterality: N/A;  9:30am w/ overtube  . ENTEROSCOPY N/A 11/27/2020   Procedure: ENTEROSCOPY;  Surgeon: Harvel Quale, MD;  Location: [facility];  Service: Gastroenterology;  Laterality: N/A;  . ESOPHAGOGASTRODUODENOSCOPY (EGD) WITH PROPOFOL N/A 11/25/2020   Procedure: ESOPHAGOGASTRODUODENOSCOPY (EGD) WITH PROPOFOL;  Surgeon: Daneil Dolin, MD;  [facility];  Service: Endoscopy;  Laterality: N/A;  . GIVENS CAPSULE STUDY N/A 11/25/2020   Procedure: GIVENS CAPSULE STUDY;  Surgeon: Daneil Dolin, MD;  [facility];  Service: Endoscopy;  Laterality: N/A;  possible capsule if IDA unexplained by EGD findings.   Marland Kitchen HOT HEMOSTASIS  11/27/2020   Procedure: HOT HEMOSTASIS (ARGON PLASMA COAGULATION/BICAP);  Surgeon:  Montez Morita, Quillian Quince, MD;  [facility]: [facility];  Service: Gastroenterology;;  . LIPOMA RESECTION    . POLYPECTOMY  03/11/2020   Procedure: POLYPECTOMY;  Surgeon: Danie Binder, MD;  [facility];  Service: Endoscopy;;  cecal, transverse, descending, sigmoid    Prior to Admission medications   Medication Sig Start Date End Date Taking? Authorizing Provider  apixaban (ELIQUIS) 5 MG TABS tablet Take 1 tablet (5 mg total) by mouth 2 (two) times daily. 07/25/20 07/20/21 Yes Satira Sark, MD  cetirizine (ZYRTEC) 10 MG tablet Take 10 mg by mouth daily as needed for allergies.   Yes [provider]  Cholecalciferol (VITAMIN D-3) 25 MCG (1000 UT) CAPS Take 1 capsule by mouth daily.   Yes [provider]  diclofenac Sodium (VOLTAREN) 1 % GEL Apply 1 application topically 4 (four) times daily as needed (pain).   Yes [provider]  ELDERBERRY PO Take 2 capsules by mouth daily.    Yes [provider]  ferrous sulfate 325 (65 FE) MG tablet Take 325 mg by mouth daily with breakfast.   Yes [provider]  flecainide (TAMBOCOR) 50 MG tablet Take 1 tablet (50 mg total) by mouth 2 (two) times daily. 11/29/20 12/29/20 Yes Satira Sark, MD  furosemide (LASIX) 40 MG tablet Take 1 tablet (40 mg total) by mouth daily. 10/27/20 01/25/21 Yes Darliss Cheney, MD  gabapentin (NEURONTIN) 300 MG capsule Take 300 mg by mouth 3 (three) times daily. 11/21/20  Yes [provider]  losartan (COZAAR) 25 MG tablet Take 0.5 tablets (12.5 mg total) by mouth daily. 10/27/20  Yes Pahwani, Einar Grad, MD  lubiprostone (AMITIZA) 24 MCG capsule Take 1 capsule (24 mcg total) by mouth in the morning and at bedtime. 09/15/20 09/15/21 Yes Carver, Elon Alas, DO  omeprazole (PRILOSEC) 40 MG capsule Take 40 mg by mouth daily.     Yes [provider]  potassium chloride SA (KLOR-CON) 20 MEQ tablet Take 20 mEq by mouth in the morning, at noon, and at bedtime.   Yes  [provider]  vitamin C (ASCORBIC ACID) 250 MG tablet Take 500 mg by mouth daily.   Yes [provider]  acetaminophen (TYLENOL) 500 MG tablet Take 1,000 mg by mouth every 6 (six) hours as needed for moderate pain.    [provider]  albuterol (VENTOLIN HFA) 108 (90 Base) MCG/ACT inhaler Inhale 1-2 puffs into the lungs every 6 (six) hours as needed for wheezing or shortness of breath.  09/23/20   [provider]  cyclobenzaprine (FLEXERIL) 5 MG tablet Take 5 mg by mouth 3 (three) times daily as needed for muscle spasms. 06/19/20   [provider]  fluticasone (FLONASE) 50 MCG/ACT nasal spray Place 1 spray into both nostrils daily as needed for allergies or rhinitis.    [provider]  Fluticasone-Salmeterol (ADVAIR) 500-50 MCG/DOSE AEPB Inhale 1 puff into the lungs every 12 (twelve) hours.    [provider]  hydrALAZINE (APRESOLINE) 50 MG tablet Take 1 tablet (50 mg total) by mouth 3 (three) times daily. Patient not taking: No sig reported 10/25/20 11/24/20  Darliss Cheney, MD  meclizine (ANTIVERT) 25 MG tablet Take 25 mg by mouth 3 (three) times daily as needed for dizziness.    [provider]    Current Facility-Administered Medications  Medication Dose Route Frequency Provider Last Rate Last Admin  . acetaminophen (TYLENOL) tablet 650 mg  650 mg Oral Q6H PRN Zierle-Ghosh, Asia B, DO       Or  . acetaminophen (TYLENOL) suppository 650 mg  650 mg Rectal Q6H PRN Zierle-Ghosh, Asia B, DO      . albuterol (VENTOLIN HFA) 108 (90 Base) MCG/ACT inhaler 1-2 puff  1-2 puff Inhalation Q6H PRN Zierle-Ghosh, Asia B, DO      . flecainide (TAMBOCOR) tablet 50 mg  50 mg Oral BID Zierle-Ghosh, Asia B, DO      . mometasone-formoterol (DULERA) 200-5 MCG/ACT inhaler 2 puff  2 puff Inhalation BID Zierle-Ghosh, Asia B, DO      . ondansetron (ZOFRAN) tablet 4 mg  4 mg Oral Q6H PRN Zierle-Ghosh, Asia B, DO       Or  . ondansetron (ZOFRAN)  injection 4 mg  4 mg Intravenous Q6H PRN Zierle-Ghosh, Asia B, DO      . pantoprazole (PROTONIX) injection 40 mg  40 mg Intravenous Q12H Zierle-Ghosh, Asia B, DO       Current Outpatient Medications  Medication Sig Dispense Refill  . apixaban (ELIQUIS) 5 MG TABS tablet Take 1 tablet (5 mg total) by mouth 2 (two) times daily. 180 tablet 3  . cetirizine (ZYRTEC) 10 MG tablet Take 10 mg by mouth daily as needed for allergies.    . Cholecalciferol (VITAMIN D-3) 25 MCG (1000 UT) CAPS Take 1 capsule by mouth daily.    . diclofenac Sodium (VOLTAREN) 1 % GEL Apply 1 application topically 4 (four) times daily as needed (pain).    Marland Kitchen ELDERBERRY PO Take 2 capsules by mouth daily.     . ferrous sulfate 325 (65 FE) MG tablet Take 325 mg by mouth daily with breakfast.    . flecainide (TAMBOCOR) 50 MG tablet Take 1 tablet (50 mg total) by mouth 2 (two) times daily. 60 tablet 3  . furosemide (LASIX) 40 MG tablet Take 1 tablet (40 mg total) by mouth daily. 90 tablet 3  . gabapentin (NEURONTIN) 300 MG capsule Take 300 mg by mouth 3 (three) times daily.    Marland Kitchen losartan (COZAAR) 25 MG tablet Take 0.5 tablets (12.5 mg total) by mouth daily.    Marland Kitchen lubiprostone (AMITIZA) 24 MCG capsule Take 1 capsule (24 mcg total) by mouth in the morning and at bedtime. 180 capsule 3  . omeprazole (PRILOSEC) 40 MG capsule Take 40 mg by mouth daily.      . potassium chloride SA (KLOR-CON) 20 MEQ tablet Take 20 mEq by mouth in the morning, at noon, and at bedtime.    . vitamin C (ASCORBIC ACID) 250 MG tablet Take 500 mg by mouth daily.    Marland Kitchen acetaminophen (TYLENOL) 500 MG tablet Take 1,000 mg by mouth every 6 (six) hours as needed for moderate pain.    Marland Kitchen albuterol (VENTOLIN HFA) 108 (90 Base) MCG/ACT inhaler Inhale 1-2 puffs into the lungs every 6 (six) hours as needed for wheezing or shortness of breath.     . cyclobenzaprine (FLEXERIL) 5 MG tablet Take 5 mg by mouth 3 (three) times daily as needed for muscle spasms.    . fluticasone  (FLONASE) 50 MCG/ACT nasal spray Place 1 spray into both nostrils daily as needed  for allergies or rhinitis.    . Fluticasone-Salmeterol (ADVAIR) 500-50 MCG/DOSE AEPB Inhale 1 puff into the lungs every 12 (twelve) hours.    . hydrALAZINE (APRESOLINE) 50 MG tablet Take 1 tablet (50 mg total) by mouth 3 (three) times daily. (Patient not taking: No sig reported) 90 tablet 0  . meclizine (ANTIVERT) 25 MG tablet Take 25 mg by mouth 3 (three) times daily as needed for dizziness.      Allergies as of 12/05/2020 - Review Complete 12/05/2020  Allergen Reaction Noted  . Acetaminophen-codeine Hives   . Allopurinol Hives   . Amlodipine  08/03/2020  . Amoxicillin Hives 03/04/2020  . Doxycycline  11/23/2020  . Tramadol Hives     Family History  Problem Relation Age of Onset  . Stroke Mother   . Dementia Father   . Cancer - Other Sister   . Atrial fibrillation Sister   . Heart failure Sister     Social History   Socioeconomic History  . Marital status: Single    Spouse name: Not on file  . Number of children: Not on file  . Years of education: Not on file  . Highest education level: Not on file  Occupational History  . Occupation: Retired  Tobacco Use  . Smoking status: Former Smoker    Packs/day: 1.00    Years: 20.00    Pack years: 20.00    Types: Cigarettes    Start date: 03/16/1964    Quit date: 10/07/1997    Years since quitting: 23.1  . Smokeless tobacco: Never Used  Vaping Use  . Vaping Use: Never used  Substance and Sexual Activity  . Alcohol use: No    Alcohol/week: 0.0 standard drinks  . Drug use: No  . Sexual activity: Yes    Partners: Male  Other Topics Concern  . Not on file  Social History Narrative  . Not on file   Social Determinants of Health   Financial Resource Strain: Not on file  Food Insecurity: Not on file  Transportation Needs: Not on file  Physical Activity: Not on file  Stress: Not on file  Social Connections: Not on file  Intimate Partner  Violence: Not on file    Review of Systems: Gen: Denies fever, chills, cold or flulike symptoms CV: See HPI Resp: See HPI  GI: See HPI MS: Pain in her right pinky toe for 1.5 months. Was on antibiotics for this previously. None since recent hospital discharge.  Derm: Denies rash Psych: Denies depression or anxiety. Heme: See HPI  Physical Exam: Vital signs in last 24 hours: Temp:  [97.7 F (36.5 C)-98.9 F (37.2 C)] 98.1 F (36.7 C) (12/14 0630) Pulse Rate:  [43-122] 54 (12/14 0722) Resp:  [12-18] 18 (12/14 0722) BP: (90-150)/(48-80) 132/60 (12/14 0722) SpO2:  [96 %-100 %] 100 % (12/14 0722)   General:  Alert,  Well-developed, well-nourished, pleasant and cooperative in NAD Head:  Normocephalic and atraumatic. Eyes:  Sclera clear, no icterus.   Conjunctiva pink. Ears:  Normal auditory acuity. Lungs:  Clear throughout to auscultation.   No wheezes, crackles, or rhonchi. No acute distress. Heart:  Regular rate and rhythm; no murmurs, clicks, rubs,  or gallops. Abdomen:  Soft, nontender and nondistended. No masses, hepatosplenomegaly or hernias noted. Normal bowel sounds, without guarding, and without rebound.   Rectal: Dark brown stool on gloved exam finger. No bright red blood. No masses appreciated. Exam limited due to patient being uncomfortable although this seems to be associated with anxiety  around rectal exam rather than pain.  Msk:  Symmetrical without gross deformities. Normal posture. Extremities:  Without edema. Right pinky toe very dark with tenderness to palpation.  Neurologic:  Alert and  oriented x4;  grossly normal neurologically. Skin:  Intact without significant lesions or rashes. Psych:  Normal mood and affect.  Intake/Output from previous day: No intake/output data recorded. Intake/Output this shift: No intake/output data recorded.  Lab Results: Recent Labs    12/05/20 2018 12/06/20 0354  WBC 6.7 6.1  HGB 7.0* 7.6*  HCT 23.3* 25.6*  PLT 310 281    BMET Recent Labs    12/05/20 2018 12/06/20 0354  NA 129* 131*  K 3.6 3.9  CL 92* 97*  CO2 27 26  GLUCOSE 119* 129*  BUN 44* 46*  CREATININE 3.89* 3.79*  CALCIUM 8.7* 8.6*   LFT Recent Labs    12/05/20 2018 12/06/20 0354  PROT 6.2* 5.9*  ALBUMIN 3.2* 3.2*  AST 22 14*  ALT 10 10  ALKPHOS 62 59  BILITOT 0.3 0.6   PT/INR Recent Labs    12/06/20 0354  LABPROT 17.7*  INR 1.5*    Studies/Results: DG Chest Port 1 View  Result Date: 12/05/2020 CLINICAL DATA:  Weakness. EXAM: PORTABLE CHEST 1 VIEW COMPARISON:  November 23, 2020. FINDINGS: Stable cardiomediastinal silhouette. No pneumothorax or pleural effusion is noted. Both lungs are clear. The visualized skeletal structures are unremarkable. IMPRESSION: No active disease. Aortic Atherosclerosis (ICD10-I70.0). Electronically Signed   By: Marijo Conception M.D.   On: 12/05/2020 20:27    Impression: 74 y.o. year old female with history of syncope, paroxysmal atrial fibrillation on Eliquis, hypertension, hyperlipidemia, grade 2 diastolic dysfunction, CKD, GERD, anemia in the setting of chronic disease and GI bleed secondary to [facility]. Recent admission earlier this month with acute blood loss anemia and heme positive stool s/p EGD, capsule study, and undergoing enteroscopy 12/5 for multiple duodenal AVMs s/p APC ablation, few AVMs and proximal jejunum also ablated with APC suspected to be comfort of GI bleeding.  She received 2 units PRBCs during that admission with discharge hemoglobin 8.2. 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 She presented to the ED 12/13 with chief complaint of syncope and was found to have hemoglobin 7.0 heme positive stool.   Patient denies overt GI bleeding.  No significant upper or lower GI symptoms.  She has maintained on omeprazole 40 mg daily for chronic GERD which is well controlled.  She resumed Eliquis just prior to her last hospital discharge with last dose  morning of 12/13.  She has been taking daily iron supplement x1 month.  Denies any NSAIDs.  She received 1 unit PRBCs with hemoglobin up to 7.6 today.  Suspect decline in hemoglobin is likely secondary to oozing from small bowel AVMs in the setting of Eliquis. 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  Discussed with Dr. Jenetta Downer.  We will start octreotide 100 mcg twice daily subcu. CKD likely influencing ongoing anemia.  Of note, patient states she has had prior discussion with cardiology regarding possible Watchman if she was not able to maintain on chronic anticoagulation.  Plan: Continue to hold Eliquis for now.  Clear liquids today. Continue IV PPI twice daily. Will start octreotide 100 mcg BID sq for bleeding AMVs. May need to consider double balloon enteroscopy to evaluate the rest of her small bowel.   Monitor H/H and transfuse as  needed. Monitor for overt GI bleeding. Recommend input from cardiology regarding risk and benefits of Eliquis.    LOS: 1 day    12/06/2020, 7:45 AM   Aliene Altes, PA-C [facility]

## 2020-12-06 NOTE — Consult Note (Signed)
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 Wynetta Emery   Chief Complaint    Loss of Consciousness      HPI: [name] [name] is a 74 y.o. female with a right fifth digit that she says has been causing her pain and issues for several months now. She and her family report seeing multiple providers and podiatry regarding her toe that started out purple and swollen and is now black in nature. She says that she has been on antibiotics for it in the past and was told she had cellulitis. She has been seen by Podiatry that injected the toe due to thoughts of it being gout she says.  She does have  A fib and is on Eliquis normally.  She denies any other signs of embolic events. Her toe when it started was acute in nature and is described as swollen and purple at the time of initial presentation.   MRI done questions possible osteo of the distal phalanx versus reactive marrow with soft tissue edema.   Past Medical History:  Diagnosis Date  . Constipation 07/17/2017  . GERD (gastroesophageal reflux disease)   . Gout   . Grade II diastolic dysfunction 81/12/9145  . Hyperlipidemia   . Hypertension   . Hypokalemia   . OSA (obstructive sleep apnea) 08/09/2020  . PAF (paroxysmal atrial fibrillation) ([place])   . PAF (paroxysmal atrial fibrillation) ([place])   . PUD (peptic ulcer disease)   . Syncope    Neurocardiogenic    Past Surgical History:  Procedure Laterality Date  . ABDOMINAL HYSTERECTOMY    . CATARACT EXTRACTION Bilateral   . COLONOSCOPY N/A 10/07/2014   Dr. Oneida Alar: redundant left colon, moderate sized external hemorrhoids   . COLONOSCOPY N/A 03/11/2020   Procedure: COLONOSCOPY;  Surgeon: Danie Binder, MD;  [facility];  Service: Endoscopy;  Laterality: N/A;  9:30am w/ overtube  . ENTEROSCOPY N/A 11/27/2020   Procedure: ENTEROSCOPY;  Surgeon: Harvel Quale, MD;  [facility];  Service:  Gastroenterology;  Laterality: N/A;  . ESOPHAGOGASTRODUODENOSCOPY (EGD) WITH PROPOFOL N/A 11/25/2020   Procedure: ESOPHAGOGASTRODUODENOSCOPY (EGD) WITH PROPOFOL;  Surgeon: Daneil Dolin, MD;  [facility];  Service: Endoscopy;  Laterality: N/A;  . GIVENS CAPSULE STUDY N/A 11/25/2020   Procedure: GIVENS CAPSULE STUDY;  Surgeon: Daneil Dolin, MD;  [facility];  Service: Endoscopy;  Laterality: N/A;  possible capsule if IDA unexplained by EGD findings.   Marland Kitchen HOT HEMOSTASIS  11/27/2020   Procedure: HOT HEMOSTASIS (ARGON PLASMA COAGULATION/BICAP);  Surgeon: Montez Morita, Quillian Quince, MD;  [facility];  Service: Gastroenterology;;  . LIPOMA RESECTION    . POLYPECTOMY  03/11/2020   Procedure: POLYPECTOMY;  Surgeon: Danie Binder, MD;  [facility];  Service: Endoscopy;;  cecal, transverse, descending, sigmoid    Family History  Problem Relation Age of Onset  . Stroke Mother   . Dementia Father   . Cancer - Other Sister   . Atrial fibrillation Sister   . Heart failure Sister   . Colon cancer Maternal Grandmother        diagnosed in her 42s    Social History   Tobacco Use  . Smoking status: Former Smoker    Packs/day: 1.00    Years: 20.00    Pack years: 20.00    Types: Cigarettes    Start date: 03/16/1964    Quit date: 10/07/1997  Years since quitting: 23.1  . Smokeless tobacco: Never Used  Vaping Use  . Vaping Use: Never used  Substance Use Topics  . Alcohol use: No    Alcohol/week: 0.0 standard drinks  . Drug use: No    Medications:  I have reviewed the patient's current medications. Prior to Admission:  Medications Prior to Admission  Medication Sig Dispense Refill Last Dose  . apixaban (ELIQUIS) 5 MG TABS tablet Take 1 tablet (5 mg total) by mouth 2 (two) times daily. 180 tablet 3 12/05/2020 at 01030  . cetirizine (ZYRTEC) 10 MG tablet Take 10 mg by mouth daily as needed for allergies.   unknown  . Cholecalciferol (VITAMIN  D-3) 25 MCG (1000 UT) CAPS Take 1 capsule by mouth daily.   12/05/2020 at Unknown time  . diclofenac Sodium (VOLTAREN) 1 % GEL Apply 1 application topically 4 (four) times daily as needed (pain).   12/05/2020 at Unknown time  . ELDERBERRY PO Take 2 capsules by mouth daily.    12/05/2020 at Unknown time  . ferrous sulfate 325 (65 FE) MG tablet Take 325 mg by mouth daily with breakfast.   12/05/2020 at Unknown time  . flecainide (TAMBOCOR) 50 MG tablet Take 1 tablet (50 mg total) by mouth 2 (two) times daily. 60 tablet 3 12/05/2020 at Unknown time  . furosemide (LASIX) 40 MG tablet Take 1 tablet (40 mg total) by mouth daily. 90 tablet 3 12/05/2020 at Unknown time  . gabapentin (NEURONTIN) 300 MG capsule Take 300 mg by mouth 3 (three) times daily.   12/05/2020 at Unknown time  . losartan (COZAAR) 25 MG tablet Take 0.5 tablets (12.5 mg total) by mouth daily.   12/05/2020 at Unknown time  . lubiprostone (AMITIZA) 24 MCG capsule Take 1 capsule (24 mcg total) by mouth in the morning and at bedtime. 180 capsule 3 12/05/2020 at Unknown time  . omeprazole (PRILOSEC) 40 MG capsule Take 40 mg by mouth daily.     12/05/2020 at Unknown time  . potassium chloride SA (KLOR-CON) 20 MEQ tablet Take 20 mEq by mouth in the morning, at noon, and at bedtime.   12/05/2020 at Unknown time  . vitamin C (ASCORBIC ACID) 250 MG tablet Take 500 mg by mouth daily.   12/05/2020 at Unknown time  . acetaminophen (TYLENOL) 500 MG tablet Take 1,000 mg by mouth every 6 (six) hours as needed for moderate pain.   unknown  . albuterol (VENTOLIN HFA) 108 (90 Base) MCG/ACT inhaler Inhale 1-2 puffs into the lungs every 6 (six) hours as needed for wheezing or shortness of breath.    unknown  . cyclobenzaprine (FLEXERIL) 5 MG tablet Take 5 mg by mouth 3 (three) times daily as needed for muscle spasms.   unknown  . fluticasone (FLONASE) 50 MCG/ACT nasal spray Place 1 spray into both nostrils daily as needed for allergies or rhinitis.   unknown   . Fluticasone-Salmeterol (ADVAIR) 500-50 MCG/DOSE AEPB Inhale 1 puff into the lungs every 12 (twelve) hours.   unknown  . hydrALAZINE (APRESOLINE) 50 MG tablet Take 1 tablet (50 mg total) by mouth 3 (three) times daily. (Patient not taking: No sig reported) 90 tablet 0 Not Taking at Unknown time  . meclizine (ANTIVERT) 25 MG tablet Take 25 mg by mouth 3 (three) times daily as needed for dizziness.   unknown   Scheduled: . flecainide  50 mg Oral BID  . mometasone-formoterol  2 puff Inhalation BID  . octreotide  100 mcg Subcutaneous Q12H  .  pantoprazole (PROTONIX) IV  40 mg Intravenous Q12H   Continuous: . sodium chloride 35 mL/hr at 12/06/20 1709   JOI:NOMVEHMCNOBSJ **OR** acetaminophen, albuterol, gabapentin, LORazepam, ondansetron **OR** ondansetron (ZOFRAN) IV  Allergies  Allergen Reactions  . Acetaminophen-Codeine Hives  . Allopurinol Hives  . Amlodipine     Felt she retained fluid in her chest   . Amoxicillin Hives    Did it involve swelling of the face/tongue/throat, SOB, or low BP? No Did it involve sudden or severe rash/hives, skin peeling, or any reaction on the inside of your mouth or nose? No Did you need to seek medical attention at a hospital or doctor's office? No When did it last happen?10 + years If all above answers are "NO", may proceed with cephalosporin use.   Marland Kitchen Doxycycline     " sick and weak"  . Tramadol Hives     ROS:  A comprehensive review of systems was negative except for: Musculoskeletal: positive for right 5th toe pain and discoloration  Blood pressure (!) 143/84, pulse 85, temperature 98.6 F (37 C), temperature source Oral, resp. rate 16, SpO2 100 %. Physical Exam Vitals reviewed.  Constitutional:      Appearance: She is normal weight.  HENT:     Head: Normocephalic.     Nose: Nose normal.  Eyes:     Pupils: Pupils are equal, round, and reactive to light.  Cardiovascular:     Rate and Rhythm: Normal rate.     Pulses:           Dorsalis pedis pulses are 0 on the right side and 0 on the left side.       Posterior tibial pulses are 0 on the right side and 0 on the left side.  Pulmonary:     Effort: Pulmonary effort is normal.  Abdominal:     General: There is no distension.     Palpations: Abdomen is soft.     Tenderness: There is no abdominal tenderness.  Musculoskeletal:        General: No swelling.     Cervical back: Normal range of motion.     Comments: Right 5th toe dry gangrene on posterior skin and tip of toe, no drainage or redness   Skin:    General: Skin is warm.  Neurological:     General: No focal deficit present.     Mental Status: She is alert and oriented to person, place, and time.  Psychiatric:        Mood and Affect: Mood normal.        Behavior: Behavior normal.     Results: Results for orders placed or performed during the hospital encounter of 12/05/20 (from the past 48 hour(s))  Resp Panel by RT-PCR (Flu A&B, Covid) Nasopharyngeal Swab     Status: None   Collection Time: 12/05/20  8:11 PM   Specimen: Nasopharyngeal Swab; Nasopharyngeal(NP) swabs in vial transport medium  Result Value Ref Range   SARS Coronavirus 2 by RT PCR NEGATIVE NEGATIVE    Comment: (NOTE) SARS-CoV-2 target nucleic acids are NOT DETECTED.  The SARS-CoV-2 RNA is generally detectable in upper respiratory specimens during the acute phase of infection. The lowest concentration of SARS-CoV-2 viral copies this assay can detect is 138 copies/mL. A negative result does not preclude SARS-Cov-2 infection and should not be used as the sole basis for treatment or other patient management decisions. A negative result may occur with  improper specimen collection/handling, submission of specimen other than nasopharyngeal  swab, presence of viral mutation(s) within the areas targeted by this assay, and inadequate number of viral copies(<138 copies/mL). A negative result must be combined with clinical observations, patient  history, and epidemiological information. The expected result is Negative.  Fact Sheet for Patients:  EntrepreneurPulse.com.au  Fact Sheet for Healthcare Providers:  IncredibleEmployment.be  This test is no t yet approved or cleared by the [place] FDA and  has been authorized for detection and/or diagnosis of SARS-CoV-2 by FDA under an Emergency Use Authorization (EUA). This EUA will remain  in effect (meaning this test can be used) for the duration of the COVID-19 declaration under Section 564(b)(1) of the Act, 21 U.S.C.section 360bbb-3(b)(1), unless the authorization is terminated  or revoked sooner.       Influenza A by PCR NEGATIVE NEGATIVE   Influenza B by PCR NEGATIVE NEGATIVE    Comment: (NOTE) The Xpert Xpress SARS-CoV-2/FLU/RSV plus assay is intended as an aid in the diagnosis of influenza from Nasopharyngeal swab specimens and should not be used as a sole basis for treatment. Nasal washings and aspirates are unacceptable for Xpert Xpress SARS-CoV-2/FLU/RSV testing.  Fact Sheet for Patients: EntrepreneurPulse.com.au  Fact Sheet for Healthcare Providers: IncredibleEmployment.be  This test is not yet approved or cleared by the [place] FDA and has been authorized for detection and/or diagnosis of SARS-CoV-2 by FDA under an Emergency Use Authorization (EUA). This EUA will remain in effect (meaning this test can be used) for the duration of the COVID-19 declaration under Section 564(b)(1) of the Act, 21 U.S.C. section 360bbb-3(b)(1), unless the authorization is terminated or revoked.  Performed at [facility], [address] [place], [place] [address]   CBC with Differential/Platelet     Status: Abnormal   Collection Time: 12/05/20  8:18 PM  Result Value Ref Range   WBC 6.7 4.0 - 10.5 K/uL   RBC 2.58 (L) 3.87 - 5.11 MIL/uL   Hemoglobin 7.0 (L) 12.0 - 15.0 g/dL   HCT 23.3 (L) 36.0 -  46.0 %   MCV 90.3 80.0 - 100.0 fL   MCH 27.1 26.0 - 34.0 pg   MCHC 30.0 30.0 - 36.0 g/dL   RDW 16.8 (H) 11.5 - 15.5 %   Platelets 310 150 - 400 K/uL   nRBC 0.0 0.0 - 0.2 %   Neutrophils Relative % 60 %   Neutro Abs 4.0 1.7 - 7.7 K/uL   Lymphocytes Relative 24 %   Lymphs Abs 1.6 0.7 - 4.0 K/uL   Monocytes Relative 9 %   Monocytes Absolute 0.6 0.1 - 1.0 K/uL   Eosinophils Relative 6 %   Eosinophils Absolute 0.4 0.0 - 0.5 K/uL   Basophils Relative 1 %   Basophils Absolute 0.0 0.0 - 0.1 K/uL   Immature Granulocytes 0 %   Abs Immature Granulocytes 0.02 0.00 - 0.07 K/uL    Comment: Performed at [facility], [address] [place],  [address]  Comprehensive metabolic panel     Status: Abnormal   Collection Time: 12/05/20  8:18 PM  Result Value Ref Range   Sodium 129 (L) 135 - 145 mmol/L   Potassium 3.6 3.5 - 5.1 mmol/L   Chloride 92 (L) 98 - 111 mmol/L   CO2 27 22 - 32 mmol/L   Glucose, Bld 119 (H) 70 - 99 mg/dL    Comment: Glucose reference range applies only to samples taken after fasting for at least 8 hours.   BUN 44 (H) 8 - 23 mg/dL   Creatinine,  Ser 3.89 (H) 0.44 - 1.00 mg/dL   Calcium 8.7 (L) 8.9 - 10.3 mg/dL   Total Protein 6.2 (L) 6.5 - 8.1 g/dL   Albumin 3.2 (L) 3.5 - 5.0 g/dL   AST 22 15 - 41 U/L   ALT 10 0 - 44 U/L   Alkaline Phosphatase 62 38 - 126 U/L   Total Bilirubin 0.3 0.3 - 1.2 mg/dL   GFR, Estimated 12 (L) >60 mL/min    Comment: (NOTE) Calculated using the CKD-EPI Creatinine Equation (2021)    Anion gap 10 5 - 15    Comment: Performed at [facility], [address], [place], [place] [address]  Troponin I (High Sensitivity)     Status: None   Collection Time: 12/05/20  8:18 PM  Result Value Ref Range   Troponin I (High Sensitivity) 13 <18 ng/L    Comment: (NOTE) Elevated high sensitivity troponin I (hsTnI) values and significant  changes across serial measurements may suggest ACS but many other  chronic and acute conditions are known to elevate  hsTnI results.  Refer to the "Links" section for chest pain algorithms and additional  guidance. Performed at [facility], [address], [place], [place] [address]   Type and screen     Status: None (Preliminary result)   Collection Time: 12/05/20  8:18 PM  Result Value Ref Range   ABO/RH(D) O POS    Antibody Screen NEG    Sample Expiration 12/08/2020,2359    Unit Number [id number]    Blood Component Type RED CELLS,LR    Unit division 00    Status of Unit ALLOCATED    Transfusion Status OK TO TRANSFUSE    Crossmatch Result Compatible    Unit Number [id number]    Blood Component Type RBC LR PHER1    Unit division 00    Status of Unit ISSUED,FINAL    Transfusion Status OK TO TRANSFUSE    Crossmatch Result      Compatible Performed at [facility], [address], [place], [place] [address]   Prepare RBC (crossmatch)     Status: None   Collection Time: 12/05/20  8:18 PM  Result Value Ref Range   Order Confirmation      ORDER PROCESSED BY BLOOD BANK Performed at [facility], [address], [place], [place] [address]   Prepare RBC (crossmatch)     Status: None   Collection Time: 12/05/20  8:18 PM  Result Value Ref Range   Order Confirmation      ORDER PROCESSED BY BLOOD BANK Performed at [facility], [address] [place], [place] [address]   Troponin I (High Sensitivity)     Status: None   Collection Time: 12/05/20 10:40 PM  Result Value Ref Range   Troponin I (High Sensitivity) 12 <18 ng/L    Comment: (NOTE) Elevated high sensitivity troponin I (hsTnI) values and significant  changes across serial measurements may suggest ACS but many other  chronic and acute conditions are known to elevate hsTnI results.  Refer to the "Links" section for chest pain algorithms and additional  guidance. Performed at [facility], [address] [place], [place] [address]   CBC     Status: Abnormal   Collection Time: 12/06/20  3:54 AM  Result Value Ref Range   WBC 6.1 4.0 -  10.5 K/uL   RBC 2.88 (L) 3.87 - 5.11 MIL/uL   Hemoglobin 7.6 (L) 12.0 - 15.0 g/dL   HCT 25.6 (L) 36.0 - 46.0 %  MCV 88.9 80.0 - 100.0 fL   MCH 26.4 26.0 - 34.0 pg   MCHC 29.7 (L) 30.0 - 36.0 g/dL   RDW 16.6 (H) 11.5 - 15.5 %   Platelets 281 150 - 400 K/uL   nRBC 0.0 0.0 - 0.2 %    Comment: Performed at [facility], [address] [place], [place] [address]  Comprehensive metabolic panel     Status: Abnormal   Collection Time: 12/06/20  3:54 AM  Result Value Ref Range   Sodium 131 (L) 135 - 145 mmol/L   Potassium 3.9 3.5 - 5.1 mmol/L   Chloride 97 (L) 98 - 111 mmol/L   CO2 26 22 - 32 mmol/L   Glucose, Bld 129 (H) 70 - 99 mg/dL    Comment: Glucose reference range applies only to samples taken after fasting for at least 8 hours.   BUN 46 (H) 8 - 23 mg/dL   Creatinine, Ser 3.79 (H) 0.44 - 1.00 mg/dL   Calcium 8.6 (L) 8.9 - 10.3 mg/dL   Total Protein 5.9 (L) 6.5 - 8.1 g/dL   Albumin 3.2 (L) 3.5 - 5.0 g/dL   AST 14 (L) 15 - 41 U/L   ALT 10 0 - 44 U/L   Alkaline Phosphatase 59 38 - 126 U/L   Total Bilirubin 0.6 0.3 - 1.2 mg/dL   GFR, Estimated 12 (L) >60 mL/min    Comment: (NOTE) Calculated using the CKD-EPI Creatinine Equation (2021)    Anion gap 8 5 - 15    Comment: Performed at [facility], [address] [place], [place] [address]  Magnesium     Status: Abnormal   Collection Time: 12/06/20  3:54 AM  Result Value Ref Range   Magnesium 2.9 (H) 1.7 - 2.4 mg/dL    Comment: Performed at [facility], [address] [place], [place] [address]  Protime-INR     Status: Abnormal   Collection Time: 12/06/20  3:54 AM  Result Value Ref Range   Prothrombin Time 17.7 (H) 11.4 - 15.2 seconds   INR 1.5 (H) 0.8 - 1.2    Comment: (NOTE) INR goal varies based on device and disease states. Performed at [facility], [address] [place], [place] [address]    Personally reviewed- toe with some soft tissue swelling and cortical destruction noted  MR FOOT RIGHT WO CONTRAST  Result  Date: 12/06/2020 CLINICAL DATA:  Fifth digit pain, question of osteomyelitis of the fifth digit EXAM: MRI OF THE RIGHT FOREFOOT WITHOUT CONTRAST TECHNIQUE: Multiplanar, multisequence MR imaging of the right was performed. No intravenous contrast was administered. COMPARISON:  None. FINDINGS: Bones/Joint/Cartilage There is minimally increased T2 hyperintense signal seen within the distal phalanx of the fifth digit with subtle T1 hypointensity. No areas of cortical destruction or periosteal reaction are seen. There is also mildly increased signal seen within the middle fifth phalanx. Ligaments The Lisfranc ligaments are intact. Muscles and Tendons The muscles surrounding the forefoot are intact without focal atrophy or tear. The flexor and extensor tendons are intact. Soft tissues Mildly increased soft tissue swelling seen around the dorsum of the forefoot with focal soft tissue swelling around the distal aspect of the fifth digit. IMPRESSION: Findings which may be suggestive of reactive marrow versus early osteomyelitis involving the fifth distal phalanx. Dorsal subcutaneous edema. Electronically Signed   By: Prudencio Pair M.D.   On: 12/06/2020 11:10   DG Chest Port 1 View  Result Date: 12/05/2020 CLINICAL DATA:  Weakness. EXAM: PORTABLE CHEST  1 VIEW COMPARISON:  November 23, 2020. FINDINGS: Stable cardiomediastinal silhouette. No pneumothorax or pleural effusion is noted. Both lungs are clear. The visualized skeletal structures are unremarkable. IMPRESSION: No active disease. Aortic Atherosclerosis (ICD10-I70.0). Electronically Signed   By: Marijo Conception M.D.   On: 12/05/2020 20:27     Assessment & Plan:  STANISHA LORENZ is a 74 y.o. female with right toe dry gangrene on exam and possible osteo versus just a necrotic dry distal phalanx based on the exam. She does have pain and is not neuropathic in that area. Discussed no palpable pulses and possibility of an embolic plaque versus clot due to her A fib  causing the ischemic toe that is now turned into dry gangrene.   -ABI to see what vascular flow she has, she may need revascularization to be able to heal an amputation -Options of keeping toe dry and covering with alcohol pad, promoting dry gangrene and letting it eventually fall off versus amputation  -Patient and family are processing this news   Discussed with Dr. Wynetta Emery.   All questions were answered to the satisfaction of the patient and family    Virl Cagey 12/06/2020, 3:44 PM

## 2020-12-07 ENCOUNTER — Ambulatory Visit: Payer: Medicare HMO | Admitting: Internal Medicine

## 2020-12-07 LAB — RENAL FUNCTION PANEL
Albumin: 3.5 g/dL (ref 3.5–5.0)
Anion gap: 10 (ref 5–15)
BUN: 38 mg/dL — ABNORMAL HIGH (ref 8–23)
CO2: 27 mmol/L (ref 22–32)
Calcium: 8.6 mg/dL — ABNORMAL LOW (ref 8.9–10.3)
Chloride: 95 mmol/L — ABNORMAL LOW (ref 98–111)
Creatinine, Ser: 3.46 mg/dL — ABNORMAL HIGH (ref 0.44–1.00)
GFR, Estimated: 13 mL/min — ABNORMAL LOW (ref >60)
Glucose, Bld: 122 mg/dL — ABNORMAL HIGH (ref 70–99)
Phosphorus: 4.2 mg/dL (ref 2.5–4.6)
Potassium: 3.6 mmol/L (ref 3.5–5.1)
Sodium: 132 mmol/L — ABNORMAL LOW (ref 135–145)

## 2020-12-07 LAB — CBC
HCT: 27.1 % — ABNORMAL LOW (ref 36.0–46.0)
Hemoglobin: 8.3 g/dL — ABNORMAL LOW (ref 12.0–15.0)
MCH: 27.6 pg (ref 26.0–34.0)
MCHC: 30.6 g/dL (ref 30.0–36.0)
MCV: 90 fL (ref 80.0–100.0)
Platelets: 315 10*3/uL (ref 150–400)
RBC: 3.01 MIL/uL — ABNORMAL LOW (ref 3.87–5.11)
RDW: 16.8 % — ABNORMAL HIGH (ref 11.5–15.5)
WBC: 6.9 10*3/uL (ref 4.0–10.5)
nRBC: 0 % (ref 0.0–0.2)

## 2020-12-07 MED ORDER — PANTOPRAZOLE SODIUM 40 MG PO TBEC
40.0000 mg | DELAYED_RELEASE_TABLET | Freq: Two times a day (BID) | ORAL | Status: DC
  Administered 2020-12-07 – 2020-12-15 (×13): 40 mg via ORAL
  Filled 2020-12-07 (×16): qty 1

## 2020-12-07 MED ORDER — POTASSIUM CHLORIDE CRYS ER 20 MEQ PO TBCR
40.0000 meq | EXTENDED_RELEASE_TABLET | Freq: Once | ORAL | Status: AC
  Administered 2020-12-07: 40 meq via ORAL
  Filled 2020-12-07: qty 2

## 2020-12-07 NOTE — Progress Notes (Addendum)
[facility]  R 5th toe with dry gangrene. ABI abnormal as expected no palpable pulses. Has pain in the area but no active drainage. MRI with reactive marrow versus distal osteo. Will likely need an amp given the pain. Will nee vascular to assess for re-vascularlization. Cannot get CTA due to creatinine.  Dr. Manuella Ghazi has spoken to Vascular who will see patient.   Curlene Labrum, MD [facility] [address] [place], [place] [address] [phone] (office)

## 2020-12-07 NOTE — Progress Notes (Signed)
PROGRESS NOTE    [name] [name]  IRW:[id number] DOB: 12/02/1946 DOA: 12/05/2020 PCP: Andres Shad, MD   Brief Narrative:  Per HPI: [name] [name]  is a 74 y.o. female, with history of syncope, paroxysmal atrial fibrillation, hypertension, hyperlipidemia, grade 2 diastolic dysfunction, GERD, and more presents to the ER with a chief complaint of syncope.  Patient reports that she was in her normal state of health except for that she may have felt fatigued for the last couple days.  Her sister and she went out to do some shopping, and when they came back she remembers telling her sister she was feeling tired.  Next thing she knew EMS was trying to wake her up.  Her sister told her she passed out.  Patient does not remember how long she was out and sister did not say.  Patient reports she did not have any preceding factors.  Patient reports in the last couple days when she has been fatigued she has not had any paresthesias, chest pain, lightheadedness, shortness of breath.  She does not think she has had any melena or hematochezia.  She does take an iron pill.  She reports having EGD and colonoscopy this year.  She also had a capsule study.  The studies revealed AVMs.  Patient has no other complaints at this time.  -Patient was admitted with symptomatic acute on chronic blood loss anemia and is status post PRBC transfusion.  GI has started her on octreotide for suspected bleeding AVMs with recent work-up performed.  She is also noted to have AKI on CKD stage IIIb.  She is requiring transfer at the moment due to a need for further evaluation by vascular surgery for her dry gangrene of her right fifth toe with poor blood supply noted on ABI.  Discussed case with Dr. Trula Slade who is aware and will need to be called once patient arrives.  Assessment & Plan:   Principal Problem:   GI bleed Active Problems:   Rectal bleeding   Paroxysmal atrial fibrillation (HCC)   Hyponatremia   Upper GI bleed    Dry gangrene ([place])   1. Acute on chronic blood loss anemia - symptomatic - Pt was admitted for GI evaluation.  She is on clears now and IV protonix 40 mg BID.  She is guaiac positive.  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  She has been transfused PRBCs with stable hemoglobin levels noted.  Plan to recheck in a.m. 2. AKI on CKD stage 3b - Following closely.   Avoid nephrotoxins.  Appears to be improving, recheck in a.m.  Baseline creatinine near 2.53. 3. Chronic atrial fibrillation - holding apixaban for now.   Flecainide for rate control management.    Discussed with cardiology Dr. Harl Bowie with recommendations to hold Eliquis (given recurrent bleeding of AVMs) until further outpatient follow-up with cardiology. 4. Hyponatremia - improved to 132 today, recheck in AM.  5. 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  Discussed with general surgery with recommendations to likely amputate, but will need further evaluation by vascular surgery based on ABI results for which patient will be transferred.  Discussed case with Dr. Trula Slade who will further evaluate upon arrival to [facility]. 6. Mild protein calorie malnutrition.    DVT prophylaxis: SCDs Code Status: Full code Family Communication: Discussed with sister at bedside Disposition Plan:  Status is: Inpatient  Remains inpatient appropriate because:Inpatient level of care appropriate due to  severity of illness   Dispo: The patient is from: Home              Anticipated d/c is to: Home              Anticipated d/c date is: > 3 days              Patient currently is not medically stable to d/c.  Consultants:   GI  General surgery  Vascular surgery-Dr. Trula Slade 12/15  Procedures:   See below  Antimicrobials:  Anti-infectives (From admission, onward)   None       Subjective: Patient seen and evaluated today with no concerns of any bleeding or significant  pain to her toe.  She denies any lightheadedness or dizziness and is overall feeling better and tolerating diet.  Objective: Vitals:   12/06/20 2105 12/07/20 0329 12/07/20 0826 12/07/20 0857  BP: (!) 141/62 (!) 155/61  (!) 151/62  Pulse: 63 76  75  Resp: 20 20  18   Temp: 98.9 F (37.2 C) 100 F (37.8 C)  100.2 F (37.9 C)  TempSrc:  Oral  Oral  SpO2: 96% 97% 91% 95%    Intake/Output Summary (Last 24 hours) at 12/07/2020 1302 Last data filed at 12/06/2020 1709 Gross per 24 hour  Intake 80.76 ml  Output 900 ml  Net -819.24 ml   There were no vitals filed for this visit.  Examination:  General exam: Appears calm and comfortable  Respiratory system: Clear to auscultation. Respiratory effort normal. Cardiovascular system: S1 & S2 heard, RRR. Gastrointestinal system: Abdomen is nondistended, soft and nontender.  Central nervous system: Alert and oriented. No focal neurological deficits. Extremities: Right fifth toe wrapped in sterile gauze with noted dry gangrene.  No discharge. Skin: No rashes, lesions or ulcers Psychiatry: Judgement and insight appear normal. Mood & affect appropriate.     Data Reviewed: I have personally reviewed following labs and imaging studies  CBC: Recent Labs  Lab 12/01/20 1316 12/05/20 2018 12/06/20 0354 12/07/20 0416  WBC 6.2 6.7 6.1 6.9  NEUTROABS 3.2 4.0  --   --   HGB 8.3* 7.0* 7.6* 8.3*  HCT 27.6* 23.3* 25.6* 27.1*  MCV 90.2 90.3 88.9 90.0  PLT 263 310 281 767   Basic Metabolic Panel: Recent Labs  Lab 12/05/20 2018 12/06/20 0354 12/07/20 0416  NA 129* 131* 132*  K 3.6 3.9 3.6  CL 92* 97* 95*  CO2 27 26 27   GLUCOSE 119* 129* 122*  BUN 44* 46* 38*  CREATININE 3.89* 3.79* 3.46*  CALCIUM 8.7* 8.6* 8.6*  MG  --  2.9*  --   PHOS  --   --  4.2   GFR: Estimated Creatinine Clearance: 12.9 mL/min (A) (by C-G formula based on SCr of 3.46 mg/dL (H)). Liver Function Tests: Recent Labs  Lab 12/05/20 2018 12/06/20 0354  12/07/20 0416  AST 22 14*  --   ALT 10 10  --   ALKPHOS 62 59  --   BILITOT 0.3 0.6  --   PROT 6.2* 5.9*  --   ALBUMIN 3.2* 3.2* 3.5   No results for input(s): LIPASE, AMYLASE in the last 168 hours. No results for input(s): AMMONIA in the last 168 hours. Coagulation Profile: Recent Labs  Lab 12/06/20 0354  INR 1.5*   Cardiac Enzymes: No results for input(s): CKTOTAL, CKMB, CKMBINDEX, TROPONINI in the last 168 hours. BNP (last 3 results) No results for input(s): PROBNP in the last 8760 hours. HbA1C: No  results for input(s): HGBA1C in the last 72 hours. CBG: No results for input(s): GLUCAP in the last 168 hours. Lipid Profile: No results for input(s): CHOL, HDL, LDLCALC, TRIG, CHOLHDL, LDLDIRECT in the last 72 hours. Thyroid Function Tests: No results for input(s): TSH, T4TOTAL, FREET4, T3FREE, THYROIDAB in the last 72 hours. Anemia Panel: No results for input(s): VITAMINB12, FOLATE, FERRITIN, TIBC, IRON, RETICCTPCT in the last 72 hours. Sepsis Labs: No results for input(s): PROCALCITON, LATICACIDVEN in the last 168 hours.  Recent Results (from the past 240 hour(s))  Resp Panel by RT-PCR (Flu A&B, Covid) Nasopharyngeal Swab     Status: None   Collection Time: 12/05/20  8:11 PM   Specimen: Nasopharyngeal Swab; Nasopharyngeal(NP) swabs in vial transport medium  Result Value Ref Range Status   SARS Coronavirus 2 by RT PCR NEGATIVE NEGATIVE Final    Comment: (NOTE) SARS-CoV-2 target nucleic acids are NOT DETECTED.  The SARS-CoV-2 RNA is generally detectable in upper respiratory specimens during the acute phase of infection. The lowest concentration of SARS-CoV-2 viral copies this assay can detect is 138 copies/mL. A negative result does not preclude SARS-Cov-2 infection and should not be used as the sole basis for treatment or other patient management decisions. A negative result may occur with  improper specimen collection/handling, submission of specimen other than  nasopharyngeal swab, presence of viral mutation(s) within the areas targeted by this assay, and inadequate number of viral copies(<138 copies/mL). A negative result must be combined with clinical observations, patient history, and epidemiological information. The expected result is Negative.  Fact Sheet for Patients:  EntrepreneurPulse.com.au  Fact Sheet for Healthcare Providers:  IncredibleEmployment.be  This test is no t yet approved or cleared by the [place] FDA and  has been authorized for detection and/or diagnosis of SARS-CoV-2 by FDA under an Emergency Use Authorization (EUA). This EUA will remain  in effect (meaning this test can be used) for the duration of the COVID-19 declaration under Section 564(b)(1) of the Act, 21 U.S.C.section 360bbb-3(b)(1), unless the authorization is terminated  or revoked sooner.       Influenza A by PCR NEGATIVE NEGATIVE Final   Influenza B by PCR NEGATIVE NEGATIVE Final    Comment: (NOTE) The Xpert Xpress SARS-CoV-2/FLU/RSV plus assay is intended as an aid in the diagnosis of influenza from Nasopharyngeal swab specimens and should not be used as a sole basis for treatment. Nasal washings and aspirates are unacceptable for Xpert Xpress SARS-CoV-2/FLU/RSV testing.  Fact Sheet for Patients: EntrepreneurPulse.com.au  Fact Sheet for Healthcare Providers: IncredibleEmployment.be  This test is not yet approved or cleared by the [place] FDA and has been authorized for detection and/or diagnosis of SARS-CoV-2 by FDA under an Emergency Use Authorization (EUA). This EUA will remain in effect (meaning this test can be used) for the duration of the COVID-19 declaration under Section 564(b)(1) of the Act, 21 U.S.C. section 360bbb-3(b)(1), unless the authorization is terminated or revoked.  Performed at [facility], [address], [place], [place] [address]           Radiology Studies: MR FOOT RIGHT WO CONTRAST  Result Date: 12/06/2020 CLINICAL DATA:  Fifth digit pain, question of osteomyelitis of the fifth digit EXAM: MRI OF THE RIGHT FOREFOOT WITHOUT CONTRAST TECHNIQUE: Multiplanar, multisequence MR imaging of the right was performed. No intravenous contrast was administered. COMPARISON:  None. FINDINGS: Bones/Joint/Cartilage There is minimally increased T2 hyperintense signal seen within the distal phalanx of the fifth digit with subtle T1 hypointensity. No areas of  cortical destruction or periosteal reaction are seen. There is also mildly increased signal seen within the middle fifth phalanx. Ligaments The Lisfranc ligaments are intact. Muscles and Tendons The muscles surrounding the forefoot are intact without focal atrophy or tear. The flexor and extensor tendons are intact. Soft tissues Mildly increased soft tissue swelling seen around the dorsum of the forefoot with focal soft tissue swelling around the distal aspect of the fifth digit. IMPRESSION: Findings which may be suggestive of reactive marrow versus early osteomyelitis involving the fifth distal phalanx. Dorsal subcutaneous edema. Electronically Signed   By: Prudencio Pair M.D.   On: 12/06/2020 11:10   US ARTERIAL ABI (SCREENING LOWER EXTREMITY)  Result Date: 12/06/2020 CLINICAL DATA:  74 year old female with dry gangrene of the right fifth digit EXAM: NONINVASIVE PHYSIOLOGIC VASCULAR STUDY OF BILATERAL LOWER EXTREMITIES TECHNIQUE: Evaluation of both lower extremities were performed at rest, including calculation of ankle-brachial indices with single level Doppler, pressure and pulse volume recording. COMPARISON:  None. FINDINGS: Right ABI:  0.8 Left ABI:  0.75 Right Lower Extremity: Biphasic arterial waveform in the posterior tibial artery. Abnormal monophasic arterial waveform in the dorsalis pedis artery. Left Lower Extremity: Biphasic arterial waveforms in both the posterior tibial and  dorsalis pedis arteries. 0.5-0.79 Moderate PAD IMPRESSION: 1. Abnormal resting right ankle-brachial index of 0.8 consistent with at least mild underlying peripheral arterial disease. 2. Abnormal resting left ankle-brachial index of 0.75 consistent with at least moderate underlying peripheral arterial disease. Signed, Criselda Peaches, MD, [facility] Radiology Electronically Signed   By: Jacqulynn Cadet M.D.   On: 12/06/2020 16:31   DG Chest Port 1 View  Result Date: 12/05/2020 CLINICAL DATA:  Weakness. EXAM: PORTABLE CHEST 1 VIEW COMPARISON:  November 23, 2020. FINDINGS: Stable cardiomediastinal silhouette. No pneumothorax or pleural effusion is noted. Both lungs are clear. The visualized skeletal structures are unremarkable. IMPRESSION: No active disease. Aortic Atherosclerosis (ICD10-I70.0). Electronically Signed   By: Marijo Conception M.D.   On: 12/05/2020 20:27        Scheduled Meds:  flecainide  50 mg Oral BID   mometasone-formoterol  2 puff Inhalation BID   octreotide  100 mcg Subcutaneous Q12H   pantoprazole  40 mg Oral BID AC   Continuous Infusions:  sodium chloride 35 mL/hr at 12/06/20 1709     LOS: 2 days    Time spent: 35 minutes    Hilma Steinhilber Darleen Crocker, DO [facility]  If 7PM-7AM, please contact night-coverage [email or web address] 12/07/2020, 1:02 PM

## 2020-12-07 NOTE — Progress Notes (Signed)
Subjective:  Feels better. No BM since admission. No dizziness or lightheadedness. Tolerating diet.   Objective: Vital signs in last 24 hours: Temp:  [98.6 F (37 C)-100.2 F (37.9 C)] 100.2 F (37.9 C) (12/15 0857) Pulse Rate:  [56-85] 75 (12/15 0857) Resp:  [13-20] 18 (12/15 0857) BP: (120-161)/(61-84) 151/62 (12/15 0857) SpO2:  [91 %-100 %] 95 % (12/15 0857)   General:   Alert,  Well-developed, well-nourished, pleasant and cooperative in NAD Head:  Normocephalic and atraumatic. Eyes:  Sclera clear, no icterus.  Abdomen:  Soft, nontender and nondistended.  Normal bowel sounds, without guarding, and without rebound.   Extremities:  Without clubbing, deformity or edema. Neurologic:  Alert and  oriented x4;  grossly normal neurologically. Skin:  Intact without significant lesions or rashes. Psych:  Alert and cooperative. Normal mood and affect.  Intake/Output from previous day: 12/14 0701 - 12/15 0700 In: 80.8 [I.V.:80.8] Out: 900 [Urine:900] Intake/Output this shift: No intake/output data recorded.  Lab Results: CBC Recent Labs    12/05/20 2018 12/06/20 0354 12/07/20 0416  WBC 6.7 6.1 6.9  HGB 7.0* 7.6* 8.3*  HCT 23.3* 25.6* 27.1*  MCV 90.3 88.9 90.0  PLT 310 281 315   BMET Recent Labs    12/05/20 2018 12/06/20 0354 12/07/20 0416  NA 129* 131* 132*  K 3.6 3.9 3.6  CL 92* 97* 95*  CO2 27 26 27   GLUCOSE 119* 129* 122*  BUN 44* 46* 38*  CREATININE 3.89* 3.79* 3.46*  CALCIUM 8.7* 8.6* 8.6*   LFTs Recent Labs    12/05/20 2018 12/06/20 0354 12/07/20 0416  BILITOT 0.3 0.6  --   ALKPHOS 62 59  --   AST 22 14*  --   ALT 10 10  --   PROT 6.2* 5.9*  --   ALBUMIN 3.2* 3.2* 3.5   No results for input(s): LIPASE in the last 72 hours. PT/INR Recent Labs    12/06/20 0354  LABPROT 17.7*  INR 1.5*      Imaging Studies: MR FOOT RIGHT WO CONTRAST  Result Date: 12/06/2020 CLINICAL DATA:  Fifth digit pain, question of osteomyelitis of the fifth digit  EXAM: MRI OF THE RIGHT FOREFOOT WITHOUT CONTRAST TECHNIQUE: Multiplanar, multisequence MR imaging of the right was performed. No intravenous contrast was administered. COMPARISON:  None. FINDINGS: Bones/Joint/Cartilage There is minimally increased T2 hyperintense signal seen within the distal phalanx of the fifth digit with subtle T1 hypointensity. No areas of cortical destruction or periosteal reaction are seen. There is also mildly increased signal seen within the middle fifth phalanx. Ligaments The Lisfranc ligaments are intact. Muscles and Tendons The muscles surrounding the forefoot are intact without focal atrophy or tear. The flexor and extensor tendons are intact. Soft tissues Mildly increased soft tissue swelling seen around the dorsum of the forefoot with focal soft tissue swelling around the distal aspect of the fifth digit. IMPRESSION: Findings which may be suggestive of reactive marrow versus early osteomyelitis involving the fifth distal phalanx. Dorsal subcutaneous edema. Electronically Signed   By: Prudencio Pair M.D.   On: 12/06/2020 11:10   US ARTERIAL ABI (SCREENING LOWER EXTREMITY)  Result Date: 12/06/2020 CLINICAL DATA:  74 year old female with dry gangrene of the right fifth digit EXAM: NONINVASIVE PHYSIOLOGIC VASCULAR STUDY OF BILATERAL LOWER EXTREMITIES TECHNIQUE: Evaluation of both lower extremities were performed at rest, including calculation of ankle-brachial indices with single level Doppler, pressure and pulse volume recording. COMPARISON:  None. FINDINGS: Right ABI:  0.8 Left ABI:  0.75 Right  Lower Extremity: Biphasic arterial waveform in the posterior tibial artery. Abnormal monophasic arterial waveform in the dorsalis pedis artery. Left Lower Extremity: Biphasic arterial waveforms in both the posterior tibial and dorsalis pedis arteries. 0.5-0.79 Moderate PAD IMPRESSION: 1. Abnormal resting right ankle-brachial index of 0.8 consistent with at least mild underlying peripheral  arterial disease. 2. Abnormal resting left ankle-brachial index of 0.75 consistent with at least moderate underlying peripheral arterial disease. Signed, Criselda Peaches, MD, [place] [facility] Radiology Electronically Signed   By: Jacqulynn Cadet M.D.   On: 12/06/2020 16:31   DG Chest Port 1 View  Result Date: 12/05/2020 CLINICAL DATA:  Weakness. EXAM: PORTABLE CHEST 1 VIEW COMPARISON:  November 23, 2020. FINDINGS: Stable cardiomediastinal silhouette. No pneumothorax or pleural effusion is noted. Both lungs are clear. The visualized skeletal structures are unremarkable. IMPRESSION: No active disease. Aortic Atherosclerosis (ICD10-I70.0). Electronically Signed   By: Marijo Conception M.D.   On: 12/05/2020 20:27   DG Chest Portable 1 View  Result Date: 11/23/2020 CLINICAL DATA:  Weakness, shortness of breath EXAM: PORTABLE CHEST 1 VIEW COMPARISON:  10/21/2020 FINDINGS: Single frontal view of the chest demonstrates an unremarkable cardiac silhouette. No acute airspace disease, effusion, or pneumothorax. No acute bony abnormalities. IMPRESSION: 1. Stable exam, no acute process. Electronically Signed   By: Randa Ngo M.D.   On: 11/23/2020 19:34   DG Foot Complete Right  Result Date: 11/23/2020 CLINICAL DATA:  Fifth toe infection EXAM: RIGHT FOOT COMPLETE - 3+ VIEW COMPARISON:  11/18/2020 FINDINGS: No definitive fracture is seen. Stable calcifications are noted along the fifth toe laterally similar to that seen on the prior exam. No definitive erosive changes are seen. Tarsal degenerative changes and calcaneal spurring is seen. No soft tissue abnormality is noted. 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 No definitive bony erosive changes are noted. Electronically Signed   By: Inez Catalina M.D.   On: 11/23/2020 20:30   DG Toe 5th Right  Result Date:  11/18/2020 CLINICAL DATA:  Right toe pain.  No known injury.  History of gout. EXAM: RIGHT FIFTH TOE COMPARISON:  None. FINDINGS: There is no evidence of fracture or dislocation. Joint spaces are preserved. Suspected erosion along the lateral cortex of the fifth toe middle phalanx. There is small mineralized densities within the soft tissues along the lateral aspect of the fifth toe near the proximal interphalangeal joint. IMPRESSION: Suspected erosion along the lateral cortex of the fifth toe middle phalanx. Small mineralized densities within the soft tissues along the lateral aspect of the fifth toe near the proximal interphalangeal joint. 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 Electronically Signed   By: Davina Poke D.O.   On: 11/18/2020 09:45  [2 weeks]   Assessment: 74 year old female with paroxysmal atrial fibrillation on Eliquis, hypertension, grade 2 diastolic dysfunction, CKD, GERD, anemia in the setting of chronic disease and GI bleeding secondary to AVMs presenting with questionable syncope. This is her second admission this month for symptomatic anemia/heme + stools. HEMOGLOBIN NORMA IN 05/2020. ELIQUIS STARTED 04/2020.   Recent admission earlier this month with acute blood loss anemia and heme positive stool status post EGD, capsule study, undergoing enteroscopy 12/5 for multiple duodenal AVMs/few proximal jejunum AVMs status post APC ablation, Discharge hemoglobin 8.2 at that time.  Last colonoscopy March 2021 benign polyps removed.  Presented this admission with hemoglobin of 7.0 (down from 8.3 the week  prior).  She has received 1 unit of packed red blood cells with an appropriate increase in her hemoglobin, today 8.3.  She has had no overt GI bleeding.  Suspected to have ongoing decline in hemoglobin secondary to oozing from small bowel AVMs in the setting of Eliquis.  Very likely has additional small bowel AVMs in her distal small bowel which  have not been treated.  Started on octreotide 100 mcg twice daily SQ yesterday.  If hemoglobin remains stable, will need to determine whether to restart Eliquis.  She is at risk for possible continued bleeding and she could easily have more small bowel AVMs.  Cannot exclude possibility of double-balloon enteroscopy be needed in the near future although again there is high risk for recurrent lesions.    Cardiology may need to be involved as patient states they have considered watchman device if she is not able to maintain chronic anticoagulation.  Kidney failure: Patient had creatinine of 1.29 in May 2021.  Acute change in creatinine in October up to 4.3 during admission.  Continues to remain significantly elevated at 3.46.  Saw Dr. Theador Hawthorne December 1 as an outpatient.  Plan: 1. Continue PPI twice daily 2. Continue octreotide 100 mcg twice daily SQ for bleeding AVMs. 3. Monitor H&H and transfuse as needed. 4. Recommend input from cardiology regarding risk and benefits of Eliquis and watchman device. 5. May require double-balloon enteroscopy to evaluate the rest of her small bowel although this cannot be done locally and would take time to arrange.  Laureen Ochs. Bernarda Caffey [facility] [phone] 12/15/202111:34 AM     LOS: 2 days

## 2020-12-08 DIAGNOSIS — K922 Gastrointestinal hemorrhage, unspecified: Secondary | ICD-10-CM

## 2020-12-08 LAB — RENAL FUNCTION PANEL
Albumin: 3.2 g/dL — ABNORMAL LOW (ref 3.5–5.0)
Anion gap: 9 (ref 5–15)
BUN: 28 mg/dL — ABNORMAL HIGH (ref 8–23)
CO2: 25 mmol/L (ref 22–32)
Calcium: 8.5 mg/dL — ABNORMAL LOW (ref 8.9–10.3)
Chloride: 98 mmol/L (ref 98–111)
Creatinine, Ser: 2.89 mg/dL — ABNORMAL HIGH (ref 0.44–1.00)
GFR, Estimated: 17 mL/min — ABNORMAL LOW (ref >60)
Glucose, Bld: 125 mg/dL — ABNORMAL HIGH (ref 70–99)
Phosphorus: 2.9 mg/dL (ref 2.5–4.6)
Potassium: 3.7 mmol/L (ref 3.5–5.1)
Sodium: 132 mmol/L — ABNORMAL LOW (ref 135–145)

## 2020-12-08 LAB — CBC
HCT: 25 % — ABNORMAL LOW (ref 36.0–46.0)
Hemoglobin: 7.5 g/dL — ABNORMAL LOW (ref 12.0–15.0)
MCH: 27.1 pg (ref 26.0–34.0)
MCHC: 30 g/dL (ref 30.0–36.0)
MCV: 90.3 fL (ref 80.0–100.0)
Platelets: 282 10*3/uL (ref 150–400)
RBC: 2.77 MIL/uL — ABNORMAL LOW (ref 3.87–5.11)
RDW: 16.6 % — ABNORMAL HIGH (ref 11.5–15.5)
WBC: 7 10*3/uL (ref 4.0–10.5)
nRBC: 0 % (ref 0.0–0.2)

## 2020-12-08 LAB — MAGNESIUM: Magnesium: 2.2 mg/dL (ref 1.7–2.4)

## 2020-12-08 MED ORDER — FERROUS SULFATE 325 (65 FE) MG PO TABS
325.0000 mg | ORAL_TABLET | Freq: Every day | ORAL | Status: DC
  Administered 2020-12-09 – 2020-12-10 (×2): 325 mg via ORAL
  Filled 2020-12-08 (×2): qty 1

## 2020-12-08 MED ORDER — LUBIPROSTONE 24 MCG PO CAPS
24.0000 ug | ORAL_CAPSULE | Freq: Two times a day (BID) | ORAL | Status: DC
  Administered 2020-12-08 – 2020-12-15 (×12): 24 ug via ORAL
  Filled 2020-12-08 (×17): qty 1

## 2020-12-08 MED ORDER — FUROSEMIDE 40 MG PO TABS
40.0000 mg | ORAL_TABLET | Freq: Every day | ORAL | Status: DC
  Administered 2020-12-08 – 2020-12-11 (×4): 40 mg via ORAL
  Filled 2020-12-08 (×4): qty 1

## 2020-12-08 NOTE — Progress Notes (Signed)
This RN changed pts dressing on right fifth toe. Dressing clean, dry and intact. Vascular surgery paged and notified pt is here per MD note for further evaluation. Will continue to monitor.

## 2020-12-08 NOTE — Progress Notes (Signed)
PROGRESS NOTE    [name] [name]  NIO:[id number] DOB: [birth year] DOA: 12/05/2020 PCP: [name] Shad, MD   Brief Narrative:  Per HPI: [name] [name] a74 y.o.female,with history of syncope, paroxysmal atrial fibrillation, hypertension, hyperlipidemia, grade 2 diastolic dysfunction, GERD, and more presents to the ER with a chief complaint of syncope. Patient reports that she was in her normal state of health except for that she may have felt fatigued for the last couple days. Her sister and she went out to do some shopping, and when they came back she remembers telling her sister she was feeling tired. Next thing she knew EMS was trying to wake her up. Her sister told her she passed out. Patient does not remember how long she was out and sister did not say. Patient reports she did not have any preceding factors. Patient reports in the last couple days when she has been fatigued she has not had any paresthesias, chest pain, lightheadedness, shortness of breath. She does not think she has had any melena or hematochezia. She does take an iron pill. She reports having EGD and colonoscopy this year. She also had a capsule study. The studies revealed AVMs. Patient has no other complaints at this time.  -Patient was admitted with symptomatic acute on chronic blood loss anemia and is status post PRBC transfusion.  GI has started her on octreotide for suspected bleeding AVMs with recent work-up performed.  She is also noted to have AKI on CKD stage IIIb.  She is requiring transfer at the moment due to a need for further evaluation by vascular surgery for her dry gangrene of her right fifth toe with poor blood supply noted on ABI.  Discussed case with Dr. Trula Slade who is aware and will need to be called once patient arrives.  Assessment & Plan:   Principal Problem:   GI bleed Active Problems:   Rectal bleeding   Paroxysmal atrial fibrillation (HCC)   Hyponatremia   Upper GI  bleed   Dry gangrene ([place])   1. Acute on chronic blood loss anemia - symptomatic - Pt was admitted for GI evaluation. She is on clears now and IV protonix 40 mg BID. She is guaiac positive. 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 She has been transfused PRBCs with stable hemoglobin levels noted.    Hemoglobin levels continue to remain stable.  GI recommending to continue on PPI twice daily as well as octreotide twice daily and ensure that hemoglobin remained stable.  Cardiology has recommended holding off on Eliquis at this time due to recurrent bleeds.  She does have chronic anemia of iron deficiency for which iron supplementation has been resumed. 2. AKI on CKD stage 3b -improving, repeat labs in a.m. Avoid nephrotoxins.  Appears to be improving, recheck in a.m.  Baseline creatinine near 2.53.  Okay to resume home oral Lasix today as patient is having some edema. 3. Chronic atrial fibrillation - holding apixaban, likely indefinitely for now. Flecainide for rate control management.   Discussed with cardiology Dr. Harl Bowie with recommendations to hold Eliquis (given recurrent bleeding of AVMs) until further outpatient follow-up with cardiology.  May potentially be a candidate for watchman device. 4. Hyponatremia -stable at 132 today, recheck in AM.  5. 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  Discussed with general surgery with recommendations to likely amputate, but will need further evaluation by vascular surgery based on ABI results for which patient will be transferred.  Discussed case with Dr. Trula Slade who will further evaluate upon arrival to [facility].  Still awaiting transfer at this time. 6. Mild protein calorie malnutrition. 7. Chronic constipation.  Restart Amitiza 12/16.  DVT prophylaxis: SCDs Code Status: Full code Family Communication: Discussed with sister at bedside 12/16 Disposition Plan:  Status is:  Inpatient  Remains inpatient appropriate because:Inpatient level of care appropriate due to severity of illness   Dispo: The patient is from: Home  Anticipated d/c is to: Home  Anticipated d/c date is: > 3 days  Patient currently is not medically stable to d/c.  Patient is currently awaiting transfer to [facility] -  for further evaluation by vascular surgery for her gangrene to right fifth toe and abnormal ABI.  Consultants:   GI  General surgery  Vascular surgery-Dr. Trula Slade 12/15  Procedures:   See below  Antimicrobials:   None   Subjective: Patient seen and evaluated today with no new acute complaints or concerns. No acute concerns or events noted overnight.  She has not had any further bowel movements.  Objective: Vitals:   12/07/20 2022 12/08/20 0357 12/08/20 0853 12/08/20 1430  BP: (!) 174/69 (!) 150/62  (!) 151/63  Pulse: 76 (!) 107  79  Resp: 16 19  18   Temp: 98 F (36.7 C) 97.8 F (36.6 C)  99.5 F (37.5 C)  TempSrc:      SpO2: 96% 100% 95% 94%    Intake/Output Summary (Last 24 hours) at 12/08/2020 1551 Last data filed at 12/07/2020 1858 Gross per 24 hour  Intake 480 ml  Output --  Net 480 ml   There were no vitals filed for this visit.  Examination:  General exam: Appears calm and comfortable  Respiratory system: Clear to auscultation. Respiratory effort normal. Cardiovascular system: S1 & S2 heard, RRR.  Gastrointestinal system: Abdomen is nondistended, soft and nontender.  Central nervous system: Alert and awake Extremities: No edema, right fifth toe gangrene present with no discharge.  Dressings present, clean dry and intact. Skin: No rashes, lesions or ulcers Psychiatry: Judgement and insight appear normal. Mood & affect appropriate.     Data Reviewed: I have personally reviewed following labs and imaging studies  CBC: Recent Labs  Lab 12/05/20 2018 12/06/20 0354 12/07/20 0416 12/08/20 0542   WBC 6.7 6.1 6.9 7.0  NEUTROABS 4.0  --   --   --   HGB 7.0* 7.6* 8.3* 7.5*  HCT 23.3* 25.6* 27.1* 25.0*  MCV 90.3 88.9 90.0 90.3  PLT 310 281 315 295   Basic Metabolic Panel: Recent Labs  Lab 12/05/20 2018 12/06/20 0354 12/07/20 0416 12/08/20 0542  NA 129* 131* 132* 132*  K 3.6 3.9 3.6 3.7  CL 92* 97* 95* 98  CO2 27 26 27 25   GLUCOSE 119* 129* 122* 125*  BUN 44* 46* 38* 28*  CREATININE 3.89* 3.79* 3.46* 2.89*  CALCIUM 8.7* 8.6* 8.6* 8.5*  MG  --  2.9*  --  2.2  PHOS  --   --  4.2 2.9   GFR: Estimated Creatinine Clearance: 15.4 mL/min (A) (by C-G formula based on SCr of 2.89 mg/dL (H)). Liver Function Tests: Recent Labs  Lab 12/05/20 2018 12/06/20 0354 12/07/20 0416 12/08/20 0542  AST 22 14*  --   --   ALT 10 10  --   --   ALKPHOS 62 59  --   --   BILITOT 0.3 0.6  --   --   PROT 6.2* 5.9*  --   --  ALBUMIN 3.2* 3.2* 3.5 3.2*   No results for input(s): LIPASE, AMYLASE in the last 168 hours. No results for input(s): AMMONIA in the last 168 hours. Coagulation Profile: Recent Labs  Lab 12/06/20 0354  INR 1.5*   Cardiac Enzymes: No results for input(s): CKTOTAL, CKMB, CKMBINDEX, TROPONINI in the last 168 hours. BNP (last 3 results) No results for input(s): PROBNP in the last 8760 hours. HbA1C: No results for input(s): HGBA1C in the last 72 hours. CBG: No results for input(s): GLUCAP in the last 168 hours. Lipid Profile: No results for input(s): CHOL, HDL, LDLCALC, TRIG, CHOLHDL, LDLDIRECT in the last 72 hours. Thyroid Function Tests: No results for input(s): TSH, T4TOTAL, FREET4, T3FREE, THYROIDAB in the last 72 hours. Anemia Panel: No results for input(s): VITAMINB12, FOLATE, FERRITIN, TIBC, IRON, RETICCTPCT in the last 72 hours. Sepsis Labs: No results for input(s): PROCALCITON, LATICACIDVEN in the last 168 hours.  Recent Results (from the past 240 hour(s))  Resp Panel by RT-PCR (Flu A&B, Covid) Nasopharyngeal Swab     Status: None   Collection Time:  12/05/20  8:11 PM   Specimen: Nasopharyngeal Swab; Nasopharyngeal(NP) swabs in vial transport medium  Result Value Ref Range Status   SARS Coronavirus 2 by RT PCR NEGATIVE NEGATIVE Final    Comment: (NOTE) SARS-CoV-2 target nucleic acids are NOT DETECTED.  The SARS-CoV-2 RNA is generally detectable in upper respiratory specimens during the acute phase of infection. The lowest concentration of SARS-CoV-2 viral copies this assay can detect is 138 copies/mL. A negative result does not preclude SARS-Cov-2 infection and should not be used as the sole basis for treatment or other patient management decisions. A negative result may occur with  improper specimen collection/handling, submission of specimen other than nasopharyngeal swab, presence of viral mutation(s) within the areas targeted by this assay, and inadequate number of viral copies(<138 copies/mL). A negative result must be combined with clinical observations, patient history, and epidemiological information. The expected result is Negative.  Fact Sheet for Patients:  EntrepreneurPulse.com.au  Fact Sheet for Healthcare Providers:  IncredibleEmployment.be  This test is no t yet approved or cleared by the [place] FDA and  has been authorized for detection and/or diagnosis of SARS-CoV-2 by FDA under an Emergency Use Authorization (EUA). This EUA will remain  in effect (meaning this test can be used) for the duration of the COVID-19 declaration under Section 564(b)(1) of the Act, 21 U.S.C.section 360bbb-3(b)(1), unless the authorization is terminated  or revoked sooner.       Influenza A by PCR NEGATIVE NEGATIVE Final   Influenza B by PCR NEGATIVE NEGATIVE Final    Comment: (NOTE) The Xpert Xpress SARS-CoV-2/FLU/RSV plus assay is intended as an aid in the diagnosis of influenza from Nasopharyngeal swab specimens and should not be used as a sole basis for treatment. Nasal washings  and aspirates are unacceptable for Xpert Xpress SARS-CoV-2/FLU/RSV testing.  Fact Sheet for Patients: EntrepreneurPulse.com.au  Fact Sheet for Healthcare Providers: IncredibleEmployment.be  This test is not yet approved or cleared by the [place] FDA and has been authorized for detection and/or diagnosis of SARS-CoV-2 by FDA under an Emergency Use Authorization (EUA). This EUA will remain in effect (meaning this test can be used) for the duration of the COVID-19 declaration under Section 564(b)(1) of the Act, 21 U.S.C. section 360bbb-3(b)(1), unless the authorization is terminated or revoked.  Performed at [facility], [address], [place], [place] [address]          Radiology Studies: US  ARTERIAL ABI (SCREENING LOWER EXTREMITY)  Result Date: 12/06/2020 CLINICAL DATA:  74 year old female with dry gangrene of the right fifth digit EXAM: NONINVASIVE PHYSIOLOGIC VASCULAR STUDY OF BILATERAL LOWER EXTREMITIES TECHNIQUE: Evaluation of both lower extremities were performed at rest, including calculation of ankle-brachial indices with single level Doppler, pressure and pulse volume recording. COMPARISON:  None. FINDINGS: Right ABI:  0.8 Left ABI:  0.75 Right Lower Extremity: Biphasic arterial waveform in the posterior tibial artery. Abnormal monophasic arterial waveform in the dorsalis pedis artery. Left Lower Extremity: Biphasic arterial waveforms in both the posterior tibial and dorsalis pedis arteries. 0.5-0.79 Moderate PAD IMPRESSION: 1. Abnormal resting right ankle-brachial index of 0.8 consistent with at least mild underlying peripheral arterial disease. 2. Abnormal resting left ankle-brachial index of 0.75 consistent with at least moderate underlying peripheral arterial disease. Signed, Criselda Peaches, MD, [facility] Radiology Electronically Signed   By: Jacqulynn Cadet M.D.   On:  12/06/2020 16:31        Scheduled Meds: . [START ON 12/09/2020] ferrous sulfate  325 mg Oral Q breakfast  . flecainide  50 mg Oral BID  . furosemide  40 mg Oral Daily  . lubiprostone  24 mcg Oral BID WC  . mometasone-formoterol  2 puff Inhalation BID  . octreotide  100 mcg Subcutaneous Q12H  . pantoprazole  40 mg Oral BID AC    LOS: 3 days    Time spent: 30 minutes    Sumaiyah Markert Darleen Crocker, DO [facility]  If 7PM-7AM, please contact night-coverage [email or web address] 12/08/2020, 3:51 PM

## 2020-12-08 NOTE — Progress Notes (Signed)
Bed assignment at [facility] received for pt. ([id number]) [facility] called for transport, assigned nurse notified of pending transfer and number to call for hand off report.

## 2020-12-08 NOTE — Progress Notes (Signed)
Report given to Jaimie at [facility] 6N. No further questions at this time. Awaiting [facility]'s arrival.

## 2020-12-08 NOTE — Progress Notes (Addendum)
Pt arrived on unit via [facility] at [place]. Pt a/o.  Received report from [employer] at [place].

## 2020-12-08 NOTE — Progress Notes (Signed)
Subjective:  No complaints. Waiting for transfer.  Objective: Vital signs in last 24 hours: Temp:  [97.8 F (36.6 C)-98.9 F (37.2 C)] 97.8 F (36.6 C) (12/16 0357) Pulse Rate:  [69-107] 107 (12/16 0357) Resp:  [16-19] 19 (12/16 0357) BP: (150-174)/(62-69) 150/62 (12/16 0357) SpO2:  [95 %-100 %] 95 % (12/16 0853) Last BM Date: 12/06/20 General:   Alert,  Well-developed, well-nourished, pleasant and cooperative in NAD Head:  Normocephalic and atraumatic. Eyes:  Sclera clear, no icterus.  Abdomen:  Soft, nontender and nondistended.  Normal bowel sounds, without guarding, and without rebound.   Neurologic:  Alert and  oriented x4;  grossly normal neurologically.   Intake/Output from previous day: 12/15 0701 - 12/16 0700 In: 480 [P.O.:480] Out: -  Intake/Output this shift: No intake/output data recorded.  Lab Results: CBC Recent Labs    12/06/20 0354 12/07/20 0416 12/08/20 0542  WBC 6.1 6.9 7.0  HGB 7.6* 8.3* 7.5*  HCT 25.6* 27.1* 25.0*  MCV 88.9 90.0 90.3  PLT 281 315 282   BMET Recent Labs    12/06/20 0354 12/07/20 0416 12/08/20 0542  NA 131* 132* 132*  K 3.9 3.6 3.7  CL 97* 95* 98  CO2 26 27 25   GLUCOSE 129* 122* 125*  BUN 46* 38* 28*  CREATININE 3.79* 3.46* 2.89*  CALCIUM 8.6* 8.6* 8.5*   LFTs Recent Labs    12/05/20 2018 12/06/20 0354 12/07/20 0416 12/08/20 0542  BILITOT 0.3 0.6  --   --   ALKPHOS 62 59  --   --   AST 22 14*  --   --   ALT 10 10  --   --   PROT 6.2* 5.9*  --   --   ALBUMIN 3.2* 3.2* 3.5 3.2*   No results for input(s): LIPASE in the last 72 hours. PT/INR Recent Labs    12/06/20 0354  LABPROT 17.7*  INR 1.5*      Imaging Studies: MR FOOT RIGHT WO CONTRAST  Result Date: 12/06/2020 CLINICAL DATA:  Fifth digit pain, question of osteomyelitis of the fifth digit EXAM: MRI OF THE RIGHT FOREFOOT WITHOUT CONTRAST TECHNIQUE: Multiplanar, multisequence MR imaging of the right was performed. No intravenous contrast was  administered. COMPARISON:  None. FINDINGS: Bones/Joint/Cartilage There is minimally increased T2 hyperintense signal seen within the distal phalanx of the fifth digit with subtle T1 hypointensity. No areas of cortical destruction or periosteal reaction are seen. There is also mildly increased signal seen within the middle fifth phalanx. Ligaments The Lisfranc ligaments are intact. Muscles and Tendons The muscles surrounding the forefoot are intact without focal atrophy or tear. The flexor and extensor tendons are intact. Soft tissues Mildly increased soft tissue swelling seen around the dorsum of the forefoot with focal soft tissue swelling around the distal aspect of the fifth digit. IMPRESSION: Findings which may be suggestive of reactive marrow versus early osteomyelitis involving the fifth distal phalanx. Dorsal subcutaneous edema. Electronically Signed   By: Prudencio Pair M.D.   On: 12/06/2020 11:10   US ARTERIAL ABI (SCREENING LOWER EXTREMITY)  Result Date: 12/06/2020 CLINICAL DATA:  74 year old female with dry gangrene of the right fifth digit EXAM: NONINVASIVE PHYSIOLOGIC VASCULAR STUDY OF BILATERAL LOWER EXTREMITIES TECHNIQUE: Evaluation of both lower extremities were performed at rest, including calculation of ankle-brachial indices with single level Doppler, pressure and pulse volume recording. COMPARISON:  None. FINDINGS: Right ABI:  0.8 Left ABI:  0.75 Right Lower Extremity: Biphasic arterial waveform in the posterior tibial artery. Abnormal  monophasic arterial waveform in the dorsalis pedis artery. Left Lower Extremity: Biphasic arterial waveforms in both the posterior tibial and dorsalis pedis arteries. 0.5-0.79 Moderate PAD IMPRESSION: 1. Abnormal resting right ankle-brachial index of 0.8 consistent with at least mild underlying peripheral arterial disease. 2. Abnormal resting left ankle-brachial index of 0.75 consistent with at least moderate underlying peripheral arterial disease. Signed, Criselda Peaches, MD, [place] [facility] Radiology Electronically Signed   By: Jacqulynn Cadet M.D.   On: 12/06/2020 16:31   DG Chest Port 1 View  Result Date: 12/05/2020 CLINICAL DATA:  Weakness. EXAM: PORTABLE CHEST 1 VIEW COMPARISON:  November 23, 2020. FINDINGS: Stable cardiomediastinal silhouette. No pneumothorax or pleural effusion is noted. Both lungs are clear. The visualized skeletal structures are unremarkable. IMPRESSION: No active disease. Aortic Atherosclerosis (ICD10-I70.0). Electronically Signed   By: Marijo Conception M.D.   On: 12/05/2020 20:27   DG Chest Portable 1 View  Result Date: 11/23/2020 CLINICAL DATA:  Weakness, shortness of breath EXAM: PORTABLE CHEST 1 VIEW COMPARISON:  10/21/2020 FINDINGS: Single frontal view of the chest demonstrates an unremarkable cardiac silhouette. No acute airspace disease, effusion, or pneumothorax. No acute bony abnormalities. IMPRESSION: 1. Stable exam, no acute process. Electronically Signed   By: Randa Ngo M.D.   On: 11/23/2020 19:34   DG Foot Complete Right  Result Date: 11/23/2020 CLINICAL DATA:  Fifth toe infection EXAM: RIGHT FOOT COMPLETE - 3+ VIEW COMPARISON:  11/18/2020 FINDINGS: No definitive fracture is seen. Stable calcifications are noted along the fifth toe laterally similar to that seen on the prior exam. No definitive erosive changes are seen. Tarsal degenerative changes and calcaneal spurring is seen. No soft tissue abnormality is noted. 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 No definitive bony erosive changes are noted. Electronically Signed   By: Inez Catalina M.D.   On: 11/23/2020 20:30   DG Toe 5th Right  Result Date: 11/18/2020 CLINICAL DATA:  Right toe pain.  No known injury.  History of gout. EXAM: RIGHT FIFTH TOE COMPARISON:  None. FINDINGS: There is no evidence of fracture or  dislocation. Joint spaces are preserved. Suspected erosion along the lateral cortex of the fifth toe middle phalanx. There is small mineralized densities within the soft tissues along the lateral aspect of the fifth toe near the proximal interphalangeal joint. IMPRESSION: Suspected erosion along the lateral cortex of the fifth toe middle phalanx. Small mineralized densities within the soft tissues along the lateral aspect of the fifth toe near the proximal interphalangeal joint. 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 Electronically Signed   By: Davina Poke D.O.   On: 11/18/2020 09:45  [2 weeks]   Assessment: 74 year old female with paroxysmal atrial fibrillation on Eliquis, hypertension, grade 2 diastolic dysfunction, CKD, GERD, anemia/GI bleeding secondary to AVMs presented with questionable syncope.  This is her second admission this month for symptomatic anemia/Hemoccult positive stool.  Hemoglobin normal in June 2021, Eliquis started in May 2021.  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  Discharge hemoglobin 8.2.  Last colonoscopy March 2021 with benign polyps removed.  Presented this admission with hemoglobin of 7 (down from 8.3 the week prior).  She has received 1 unit of packed red blood cells.  Hemoglobin went  up to 7.6, hemoglobin 8.3 yesterday, 7.5 today.  Continues to deny overt GI bleeding.  Suspected to have ongoing decline in hemoglobin secondary to oozing from small bowel AVMs in the setting of Eliquis.  Very likely has additional small bowel AVMs in her distal small bowel which have not been treated.  She was started on octreotide 100 mcg twice daily subcu this admission.  Eliquis remains on hold.   Kidney failure:Patient had creatinine of 1.29 in May  2021.  Acute change in creatinine in October up to 4.3 during admission.  Creatinine 3.46 yesterday, below 3 today.  Saw Dr. Theador Hawthorne December 1 as an outpatient.   Plan: 1. Continue PPI twice daily 2. Continue octreotide 100 mcg twice daily SQ for bleeding AVMs. Goal to continue as an outpatient to give time to determine if effective in hold her H/H.   3. Continue to monitor H&H and transfuse as needed. 4. Recommend input from cardiology regarding risk and benefits of Eliquis and watchman device. 5. If there is evidence of intermittent bleeding and need for transfusion she may have to be referred to tertiary care for single or double balloon enteroscopy.  We have expertise locally (Dr. Jenetta Downer) but do not yet have the equipment. 6. If she displays active overt GI bleeding would consider CTA abdomen and pelvis if renal function permitted for GI bleeding scan. 7. No plans for further endoscopic workup during this admission.  8. Will follow with you until transfer.   [name] [name]. Bernarda C[facility] [phone] 12/16/20211:07 PM     LOS: 3 days

## 2020-12-09 DIAGNOSIS — I96 Gangrene, not elsewhere classified: Secondary | ICD-10-CM

## 2020-12-09 DIAGNOSIS — N179 Acute kidney failure, unspecified: Secondary | ICD-10-CM

## 2020-12-09 LAB — RENAL FUNCTION PANEL
Albumin: 2.8 g/dL — ABNORMAL LOW (ref 3.5–5.0)
Anion gap: 10 (ref 5–15)
BUN: 23 mg/dL (ref 8–23)
CO2: 28 mmol/L (ref 22–32)
Calcium: 8.6 mg/dL — ABNORMAL LOW (ref 8.9–10.3)
Chloride: 97 mmol/L — ABNORMAL LOW (ref 98–111)
Creatinine, Ser: 2.81 mg/dL — ABNORMAL HIGH (ref 0.44–1.00)
GFR, Estimated: 17 mL/min — ABNORMAL LOW (ref >60)
Glucose, Bld: 114 mg/dL — ABNORMAL HIGH (ref 70–99)
Phosphorus: 3.3 mg/dL (ref 2.5–4.6)
Potassium: 3.1 mmol/L — ABNORMAL LOW (ref 3.5–5.1)
Sodium: 135 mmol/L (ref 135–145)

## 2020-12-09 LAB — CBC
HCT: 24.7 % — ABNORMAL LOW (ref 36.0–46.0)
Hemoglobin: 7.6 g/dL — ABNORMAL LOW (ref 12.0–15.0)
MCH: 27 pg (ref 26.0–34.0)
MCHC: 30.8 g/dL (ref 30.0–36.0)
MCV: 87.6 fL (ref 80.0–100.0)
Platelets: 301 10*3/uL (ref 150–400)
RBC: 2.82 MIL/uL — ABNORMAL LOW (ref 3.87–5.11)
RDW: 16.3 % — ABNORMAL HIGH (ref 11.5–15.5)
WBC: 6.2 10*3/uL (ref 4.0–10.5)
nRBC: 0 % (ref 0.0–0.2)

## 2020-12-09 LAB — BPAM RBC
Blood Product Expiration Date: 202201182359
Blood Product Expiration Date: 202201192359
ISSUE DATE / TIME: 202112132253
Unit Type and Rh: 5100
Unit Type and Rh: 5100

## 2020-12-09 LAB — TYPE AND SCREEN
ABO/RH(D): O POS
Antibody Screen: NEGATIVE
Unit division: 0
Unit division: 0

## 2020-12-09 LAB — FERRITIN: Ferritin: 71 ng/mL (ref 11–307)

## 2020-12-09 LAB — IRON AND TIBC
Iron: 11 ug/dL — ABNORMAL LOW (ref 28–170)
Saturation Ratios: 3 % — ABNORMAL LOW (ref 10.4–31.8)
TIBC: 342 ug/dL (ref 250–450)
UIBC: 331 ug/dL

## 2020-12-09 LAB — PREPARE RBC (CROSSMATCH)

## 2020-12-09 LAB — TRANSFERRIN: Transferrin: 244 mg/dL (ref 192–382)

## 2020-12-09 MED ORDER — POTASSIUM CHLORIDE CRYS ER 20 MEQ PO TBCR
40.0000 meq | EXTENDED_RELEASE_TABLET | Freq: Once | ORAL | Status: AC
  Administered 2020-12-09: 40 meq via ORAL
  Filled 2020-12-09: qty 2

## 2020-12-09 MED ORDER — SODIUM CHLORIDE 0.9% IV SOLUTION: Freq: Once | INTRAVENOUS | Status: DC

## 2020-12-09 MED ORDER — VANCOMYCIN HCL 1250 MG/250ML IV SOLN
1250.0000 mg | Freq: Once | INTRAVENOUS | Status: AC
  Administered 2020-12-09: 1250 mg via INTRAVENOUS
  Filled 2020-12-09: qty 250

## 2020-12-09 MED ORDER — VANCOMYCIN HCL IN DEXTROSE 1-5 GM/200ML-% IV SOLN
1000.0000 mg | INTRAVENOUS | Status: DC
  Administered 2020-12-11: 1000 mg via INTRAVENOUS
  Filled 2020-12-09 (×3): qty 200

## 2020-12-09 NOTE — Progress Notes (Addendum)
PROGRESS NOTE    [name] [name]  JKD:[id number] DOB: [birth year] DOA: 12/05/2020 PCP: Andres Shad, MD    Brief Narrative:  [name] [name] was admitted to the hospital working diagnosis of acute symptomatic anemia. Transferred to [facility] from [facility] 12/08/20 for vascular surgery evaluation dry gangrene right foot 5th toe.   74 year old female with past medical history for paroxysmal atrial fibrillation, hypertension, dyslipidemia, diastolic heart failure, and GERD.  Patient developed a syncope episode, preceded by fatigue for several days.  On her initial physical examination temperature 98.8, heart rate 79, respiratory rate 15, blood pressure 140/66, her lungs are clear to auscultation bilaterally, heart S1-S2, present rhythmic, her abdomen was soft, no lower extremity edema.  Sodium 129, potassium 3.6, chloride 92, bicarb 27, glucose 119, BUN 44, creatinine 3.89, white count 6.7, hemoglobin 7.0, hematocrit 23.3, platelets 310.  SARS COVID-19 negative. Chest radiograph with mild cardiomegaly, no infiltrates. EKG 71 bpm, normal axis, normal intervals, sinus with sinus arrhythmia, no ST segment or T wave changes.  Patient received #1 PRBC cell transfusion, placed on intravenous pantoprazole and octreotide.  Apixaban was held.  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   Further work up with foot MRI showed nonspecific findings, concerning for possible osteomyelitis.  ABI were consistent with mild to moderate peripheral vascular disease. General surgery consulted, recommendations to transfer to [facility] for further vascular surgery evaluation.  Assessment & Plan:   Principal Problem:   GI bleed Active Problems:   Rectal bleeding   Paroxysmal atrial fibrillation (HCC)   Hyponatremia   Upper GI bleed   Dry gangrene ([place])   1. Acute symptomatic anemia due to acute blood loss due to upper GI bleed. Sp 1 units PRBC Her Hgb is  7,6 with hct at 24,7 with Plt at 301.   Patient may need surgical intervention to her right foot, will order PRBC transfusion today. Old records personally reviewed 10/21/20 with positive iron deficiency, will check iron panel on this admission, if continue to be low despite oral iron supplementation will add oral iron.   Continue with sq octreotide per GI recommendations.   2. 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  Possible osteomyelitis per MRI, pain is well controlled, dressing in place.  Add IV vancomycin for now and will follow up with vascular surgery recommendations. I called the office personally to inform patient now in [facility].   3. Paroxysmal atrial fibrillation. Patient on sinus rhythm, holding anticoagulation due to GI bleeding.  Continue with flecainide   4. 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 Renal function with serum cr down to 2,81 from peak of 3,89 on admission. Na is 135, K 3,1 and bicarbonate 28. BUN 23. Continue supportive medical therapy, avoid hypotension and nephrotoxic medications.  Plan for PRBC today, no clinical signs of volume overload. Continue with home dose of furosemide.  Add 40 meq Kcl po x1 and follow up renal function in am.  5. Anxiety. Continue with as needed lorazepam.   6. COPD. Continue with dulera and as needed albuterol.   7. Mild protein calorie malnutrition. Consult nutrition for recommendations.   Patient continue to be at high risk for worsening right foot gangrene.   Status is: Inpatient  Remains inpatient appropriate because:IV treatments appropriate due to intensity of illness or inability to take PO   Dispo: The patient is from: Home  Anticipated d/c is to: Home              Anticipated d/c date is: 3 days              Patient currently is not medically stable to d/c.   DVT prophylaxis: scd   Code Status:   full  Family Communication:  I spoke with patient's sister at the  bedside, we talked in detail about patient's condition, plan of care and prognosis and all questions were addressed.     Consultants:   GI   Surgery   Vascular surgery   Procedures:     Antimicrobials:   IV vancomycin     Subjective: Patient with stable right foot pain, no nausea or vomiting, continue to be very weak.   Objective: Vitals:   12/09/20 0100 12/09/20 0402 12/09/20 0814 12/09/20 1326  BP: (!) 158/70 (!) 136/59  (!) 156/66  Pulse: 70 62  64  Resp: 17 16  16   Temp: 98.2 F (36.8 C) 98.8 F (37.1 C)  98.5 F (36.9 C)  TempSrc: Oral Oral  Oral  SpO2: 99% 99% 98% 100%    Intake/Output Summary (Last 24 hours) at 12/09/2020 1449 Last data filed at 12/09/2020 1000 Gross per 24 hour  Intake 300 ml  Output --  Net 300 ml   There were no vitals filed for this visit.  Examination:   General: Not in pain or dyspnea, deconditioned  Neurology: Awake and alert, non focal  E ENT: mild pallor, no icterus, oral mucosa moist Cardiovascular: No JVD. S1-S2 present, rhythmic, no gallops, rubs, or murmurs. No lower extremity edema. Pulmonary: positive breath sounds bilaterally, adequate air movement, no wheezing, rhonchi or rales. Gastrointestinal. Abdomen soft and non tender Skin. No rashes Musculoskeletal: no joint deformities/ right foot with dressing in place.      Data Reviewed: I have personally reviewed following labs and imaging studies  CBC: Recent Labs  Lab 12/05/20 2018 12/06/20 0354 12/07/20 0416 12/08/20 0542 12/09/20 0020  WBC 6.7 6.1 6.9 7.0 6.2  NEUTROABS 4.0  --   --   --   --   HGB 7.0* 7.6* 8.3* 7.5* 7.6*  HCT 23.3* 25.6* 27.1* 25.0* 24.7*  MCV 90.3 88.9 90.0 90.3 87.6  PLT 310 281 315 282 784   Basic Metabolic Panel: Recent Labs  Lab 12/05/20 2018 12/06/20 0354 12/07/20 0416 12/08/20 0542 12/09/20 0020  NA 129* 131* 132* 132* 135  K 3.6 3.9 3.6 3.7 3.1*  CL 92* 97* 95* 98 97*  CO2 27 26 27 25 28   GLUCOSE 119* 129* 122*  125* 114*  BUN 44* 46* 38* 28* 23  CREATININE 3.89* 3.79* 3.46* 2.89* 2.81*  CALCIUM 8.7* 8.6* 8.6* 8.5* 8.6*  MG  --  2.9*  --  2.2  --   PHOS  --   --  4.2 2.9 3.3   GFR: Estimated Creatinine Clearance: 15.9 mL/min (A) (by C-G formula based on SCr of 2.81 mg/dL (H)). Liver Function Tests: Recent Labs  Lab 12/05/20 2018 12/06/20 0354 12/07/20 0416 12/08/20 0542 12/09/20 0020  AST 22 14*  --   --   --   ALT 10 10  --   --   --   ALKPHOS 62 59  --   --   --   BILITOT 0.3 0.6  --   --   --   PROT 6.2* 5.9*  --   --   --   ALBUMIN 3.2* 3.2*  3.5 3.2* 2.8*   No results for input(s): LIPASE, AMYLASE in the last 168 hours. No results for input(s): AMMONIA in the last 168 hours. Coagulation Profile: Recent Labs  Lab 12/06/20 0354  INR 1.5*   Cardiac Enzymes: No results for input(s): CKTOTAL, CKMB, CKMBINDEX, TROPONINI in the last 168 hours. BNP (last 3 results) No results for input(s): PROBNP in the last 8760 hours. HbA1C: No results for input(s): HGBA1C in the last 72 hours. CBG: No results for input(s): GLUCAP in the last 168 hours. Lipid Profile: No results for input(s): CHOL, HDL, LDLCALC, TRIG, CHOLHDL, LDLDIRECT in the last 72 hours. Thyroid Function Tests: No results for input(s): TSH, T4TOTAL, FREET4, T3FREE, THYROIDAB in the last 72 hours. Anemia Panel: No results for input(s): VITAMINB12, FOLATE, FERRITIN, TIBC, IRON, RETICCTPCT in the last 72 hours.    Radiology Studies: I have reviewed all of the imaging during this hospital visit personally     Scheduled Meds: . ferrous sulfate  325 mg Oral Q breakfast  . flecainide  50 mg Oral BID  . furosemide  40 mg Oral Daily  . lubiprostone  24 mcg Oral BID WC  . mometasone-formoterol  2 puff Inhalation BID  . octreotide  100 mcg Subcutaneous Q12H  . pantoprazole  40 mg Oral BID AC   Continuous Infusions:   LOS: 4 days        Julie Nay Gerome Apley, MD

## 2020-12-09 NOTE — Progress Notes (Signed)
Pharmacy Antibiotic Note  [name] is a 74 y.o. female admitted on 12/05/2020 with osteomyelitis.  Pharmacy has been consulted for Vancomycin dosing.  With CKD - Scr 2.89  Plan: Vancomycin 1250 mg iv x 1 then 1 gram iv Q 48 hours Monitor progress, Scr, cultures     Temp (24hrs), Avg:98.5 F (36.9 C), Min:98.2 F (36.8 C), Max:98.8 F (37.1 C)  Recent Labs  Lab 12/05/20 2018 12/06/20 0354 12/07/20 0416 12/08/20 0542 12/09/20 0020  WBC 6.7 6.1 6.9 7.0 6.2  CREATININE 3.89* 3.79* 3.46* 2.89* 2.81*    Estimated Creatinine Clearance: 15.9 mL/min (A) (by C-G formula based on SCr of 2.81 mg/dL (H)).    Allergies  Allergen Reactions  . Acetaminophen-Codeine Hives  . Allopurinol Hives  . Amlodipine     Felt she retained fluid in her chest   . Amoxicillin Hives    Did it involve swelling of the face/tongue/throat, SOB, or low BP? No Did it involve sudden or severe rash/hives, skin peeling, or any reaction on the inside of your mouth or nose? No Did you need to seek medical attention at a hospital or doctor's office? No When did it last happen?10 + years If all above answers are "NO", may proceed with cephalosporin use.   Marland Kitchen Doxycycline     " sick and weak"  . Tramadol Hives    Thank you  Anette Guarneri, PharmD [phone]  12/09/2020 3:39 PM

## 2020-12-09 NOTE — Consult Note (Addendum)
Hospital Consult    Reason for Consult:  Gangrene right 5th toe Requesting Physician:  Dr. Sander Radon MRN #:  [id number]  History of Present Illness: This is a 74 y.o. female who presented via EMS to [facility] on 12/05/20 following a syncopal episode at home. 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 On presentation she was additionally found to have right 5th toe gangrene. MRI shows some concerns for possible osteomyelitis. She was initiated on Vancomycin. ABIs were obtained consistent with mild arterial disease. She explains that she initially felt like there was a cut on her 5th toe that developed 1- 1.5 months ago. She has had several rounds of antibiotics. She says that it did improve some after taking the antibiotics. She also feels that in general it is looking better than it did several weeks ago. During recent hospitalizations she has been given various local wound care instructions from alcohol to neosporin to Epson salt soaks. She says it continued to progress despite trying to take care of it. She presently reports that she just has pain in the 5th toe. She denies any pain in her right foot or leg. She does not have any lower extremity claudication symptoms. She ambulates without assistance. She denies any numbness or tingling in her feet.    Her risk factors include hypertension, hyperlipidemia, CKD,and she is a former smoker  Vascular surgery has consulted to see and evaluate her for peripheral arterial disease  Past Medical History:  Diagnosis Date  . Constipation 07/17/2017  . GERD (gastroesophageal reflux disease)   . Gout   . Grade II diastolic dysfunction 58/04/2777  . Hyperlipidemia   . Hypertension   . Hypokalemia   . OSA (obstructive sleep apnea) 08/09/2020  . PAF (paroxysmal atrial fibrillation) ([place])   .  PAF (paroxysmal atrial fibrillation) ([place])   . PUD (peptic ulcer disease)   . Syncope    Neurocardiogenic    Past Surgical History:  Procedure Laterality Date  . ABDOMINAL HYSTERECTOMY    . CATARACT EXTRACTION Bilateral   . COLONOSCOPY N/A 10/07/2014   Dr. Oneida Alar: redundant left colon, moderate sized external hemorrhoids   . COLONOSCOPY N/A 03/11/2020   Procedure: COLONOSCOPY;  Surgeon: Danie Binder, MD;  Location: [facility] ENDO [facility];  Service: Endoscopy;  Laterality: N/A;  9:30am w/ overtube  . ENTEROSCOPY N/A 11/27/2020   Procedure: ENTEROSCOPY;  Surgeon: Harvel Quale, MD;  Location: [facility] ENDO [facility]ITE;  Service: Gastroenterology;  Laterality: N/A;  . ESOPHAGOGASTRODUODENOSCOPY (EGD) WITH PROPOFOL N/A 11/25/2020   Procedure: ESOPHAGOGASTRODUODENOSCOPY (EGD) WITH PROPOFOL;  Surgeon: Daneil Dolin, MD;  Location: [facility];  Service: Endoscopy;  Laterality: N/A;  . GIVENS CAPSULE STUDY N/A 11/25/2020   Procedure: GIVENS CAPSULE STUDY;  Surgeon: Daneil Dolin, MD;  Location: [facility]O [facility];  Service: Endoscopy;  Laterality: N/A;  possible capsule if IDA unexplained by EGD findings.   Marland Kitchen HOT HEMOSTASIS  11/27/2020   Procedure: HOT HEMOSTASIS (ARGON PLASMA COAGULATION/BICAP);  Surgeon: Montez Morita, Quillian Quince, MD;  Location: [facility];  Service: Gastroenterology;;  . LIPOMA RESECTION    . POLYPECTOMY  03/11/2020   Procedure: POLYPECTOMY;  Surgeon: Danie Binder, MD;  Location: [facility];  Service: Endoscopy;;  cecal, transverse, descending, sigmoid    Allergies  Allergen Reactions  . Acetaminophen-Codeine Hives  . Allopurinol Hives  . Amlodipine  Felt she retained fluid in her chest   . Amoxicillin Hives    Did it involve swelling of the face/tongue/throat, SOB, or low BP? No Did it involve sudden or severe rash/hives, skin peeling, or any reaction on the inside of your mouth or nose? No Did you need to seek medical attention at a hospital or doctor's office?  No When did it last happen?10 + years If all above answers are "NO", may proceed with cephalosporin use.   Marland Kitchen Doxycycline     " sick and weak"  . Tramadol Hives    Prior to Admission medications   Medication Sig Start Date End Date Taking? Authorizing Provider  apixaban (ELIQUIS) 5 MG TABS tablet Take 1 tablet (5 mg total) by mouth 2 (two) times daily. 07/25/20 07/20/21 Yes Satira Sark, MD  cetirizine (ZYRTEC) 10 MG tablet Take 10 mg by mouth daily as needed for allergies.   Yes [provider]  Cholecalciferol (VITAMIN D-3) 25 MCG (1000 UT) CAPS Take 1 capsule by mouth daily.   Yes [provider]  diclofenac Sodium (VOLTAREN) 1 % GEL Apply 1 application topically 4 (four) times daily as needed (pain).   Yes [provider]  ELDERBERRY PO Take 2 capsules by mouth daily.    Yes [provider]  ferrous sulfate 325 (65 FE) MG tablet Take 325 mg by mouth daily with breakfast.   Yes [provider]  flecainide (TAMBOCOR) 50 MG tablet Take 1 tablet (50 mg total) by mouth 2 (two) times daily. 11/29/20 12/29/20 Yes Satira Sark, MD  furosemide (LASIX) 40 MG tablet Take 1 tablet (40 mg total) by mouth daily. 10/27/20 01/25/21 Yes Darliss Cheney, MD  gabapentin (NEURONTIN) 300 MG capsule Take 300 mg by mouth 3 (three) times daily. 11/21/20  Yes [provider]  losartan (COZAAR) 25 MG tablet Take 0.5 tablets (12.5 mg total) by mouth daily. 10/27/20  Yes Pahwani, Einar Grad, MD  lubiprostone (AMITIZA) 24 MCG capsule Take 1 capsule (24 mcg total) by mouth in the morning and at bedtime. 09/15/20 09/15/21 Yes Carver, Elon Alas, DO  omeprazole (PRILOSEC) 40 MG capsule Take 40 mg by mouth daily.     Yes [provider]  potassium chloride SA (KLOR-CON) 20 MEQ tablet Take 20 mEq by mouth in the morning, at noon, and at bedtime.   Yes [provider]  vitamin C (ASCORBIC ACID) 250 MG tablet Take 500 mg by mouth daily.   Yes [provider]  acetaminophen (TYLENOL) 500 MG tablet Take 1,000 mg by mouth every 6 (six) hours as needed for moderate pain.    [provider]  albuterol (VENTOLIN HFA) 108 (90 Base) MCG/ACT inhaler Inhale 1-2 puffs into the lungs every 6 (six) hours as needed for wheezing or shortness of breath.  09/23/20   [provider]  cyclobenzaprine (FLEXERIL) 5 MG tablet Take 5 mg by mouth 3 (three) times daily as needed for muscle spasms. 06/19/20   [provider]  fluticasone (FLONASE) 50 MCG/ACT nasal spray Place 1 spray into both nostrils daily as needed for allergies or rhinitis.    [provider]  Fluticasone-Salmeterol (ADVAIR) 500-50 MCG/DOSE AEPB Inhale 1 puff into the lungs every 12 (twelve) hours.    [provider]  hydrALAZINE (APRESOLINE) 50 MG tablet Take 1 tablet (50 mg total) by mouth 3 (three) times daily. Patient not taking: No sig reported 10/25/20 11/24/20  Darliss Cheney, MD  meclizine (ANTIVERT) 25 MG tablet Take  25 mg by mouth 3 (three) times daily as needed for dizziness.    [provider]    Social History   Socioeconomic History  . Marital status: Single    Spouse name: Not on file  . Number of children: Not on file  . Years of education: Not on file  . Highest education level: Not on file  Occupational History  . Occupation: Retired  Tobacco Use  . Smoking status: Former Smoker    Packs/day: 1.00    Years: 20.00    Pack years: 20.00    Types: Cigarettes    Start date: 03/16/1964    Quit date: 10/07/1997    Years since quitting: 23.1  . Smokeless tobacco: Never Used  Vaping Use  . Vaping Use: Never used  Substance and Sexual Activity  . Alcohol use: No    Alcohol/week: 0.0 standard drinks  . Drug use: No  . Sexual activity: Yes    Partners: Male  Other Topics Concern  . Not on file  Social History Narrative  . Not on file   Social Determinants of Health   Financial Resource Strain: Not on file  Food  Insecurity: Not on file  Transportation Needs: Not on file  Physical Activity: Not on file  Stress: Not on file  Social Connections: Not on file  Intimate Partner Violence: Not on file     Family History  Problem Relation Age of Onset  . Stroke Mother   . Dementia Father   . Cancer - Other Sister   . Atrial fibrillation Sister   . Heart failure Sister   . Colon cancer Maternal Grandmother        diagnosed in her 90s    ROS: Otherwise negative unless mentioned in HPI  Physical Examination  Vitals:   12/09/20 0814 12/09/20 1326  BP:  (!) 156/66  Pulse:  64  Resp:  16  Temp:  98.5 F (36.9 C)  SpO2: 98% 100%   There is no height or weight on file to calculate BMI.  General:  WDWN in NAD Gait: Normal HENT: WNL, normocephalic Pulmonary: normal non-labored breathing\ Cardiac: regular, without  Murmurs, rubs or gallops; without carotid bruits Abdomen:  soft, NT/ND, no masses Vascular Exam/Pulses: 2+ femoral pulses bilaterally, not able to palpate distal pulses bilaterally. Feet are warm and well perfused Extremities: with ischemic changes of the right 5th toe, with dry Gangrene of distal right 5th toe , without cellulitis     Musculoskeletal: no muscle wasting or atrophy  Neurologic: A&O X 3;  No focal weakness or paresthesias are detected; speech is fluent/normal Psychiatric:  The pt has Normal affect.  CBC    Component Value Date/Time   WBC 6.2 12/09/2020 0020   RBC 2.82 (L) 12/09/2020 0020   HGB 7.6 (L) 12/09/2020 0020   HCT 24.7 (L) 12/09/2020 0020   PLT 301 12/09/2020 0020   MCV 87.6 12/09/2020 0020   MCH 27.0 12/09/2020 0020   MCHC 30.8 12/09/2020 0020   RDW 16.3 (H) 12/09/2020 0020   LYMPHSABS 1.6 12/05/2020 2018   MONOABS 0.6 12/05/2020 2018   EOSABS 0.4 12/05/2020 2018   BASOSABS 0.0 12/05/2020 2018    BMET    Component Value Date/Time   NA 135 12/09/2020 0020   K 3.1 (L) 12/09/2020 0020   CL 97 (L) 12/09/2020 0020   CO2 28 12/09/2020 0020    GLUCOSE 114 (H) 12/09/2020 0020   BUN 23 12/09/2020 0020   CREATININE 2.81 (  H) 12/09/2020 0020   CREATININE 1.17 (H) 05/06/2012 1000   CALCIUM 8.6 (L) 12/09/2020 0020   GFRNONAA 17 (L) 12/09/2020 0020   GFRAA 20 (L) 09/11/2020 0519    COAGS: Lab Results  Component Value Date   INR 1.5 (H) 12/06/2020   INR 1.4 (H) 11/24/2020   INR 1.8 (H) 10/20/2020     Non-Invasive Vascular Imaging:   12/06/20 Right ABI: 0.8 Left ABI :0.75  Statin:  No. Beta Blocker:  No. Aspirin:  No. ACEI:  No. ARB:  No. CCB use:  No Other antiplatelets/anticoagulants:  No.    ASSESSMENT/PLAN: This is a 74 y.o. 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 Her ABIs bilaterally are mildly abnormal. Her lower extremities are well perfused and warm. Her pedal pulses are not palpable but she has brisk doppler PT/ Pero/ DP signals except for the right DP is mildly diminished. She likely has some tibial disease. She is scheduled for Transfusion today with Hgb of 7.6. 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 I discussed with patient, her sister and also her daughter over the phone the recommendation to have an Arteriogram of her right lower extremity. This will likely need to be performed with combination of contrast and CO2 due to her renal function. She will need to continue to hold her Eliquis. Her angiogram with tentatively by planned for Tuesday 12/21. Patient was seen and evaluated with Dr. Carlis Abbott. He will provider further management details   [name] [facility] [phone] 12/09/2020  4:42 PM   I have seen and evaluated the patient. I agree with the PA note as documented above.  74 year old female that vascular surgery has been consulted for right fifth toe gangrene.  She states this has been present for about the last 1.5 months.  She was transferred  from [facility] where she presented with symptomatic anemia and acute upper GI bleed (she is also on Eliquis for afib) in addition to AKI on stage IV CKD.  She denies any rest pain or history of claudication.  She has nice femoral pulses that are palpable.  Unable to appreciate pedal pulses on exam.  ABIs are 0.8.  She would likely benefit from arteriogram next week and possible later toe amputation.  Unfortunately there is no time on the OR schedule on Monday so we will have to look later into the week.  We will need to watch her renal function and hemoglobin through the weekend.  Marty Heck, MD [facility] [place] Office: [phone]

## 2020-12-09 NOTE — H&P (View-Only) (Signed)
Hospital Consult    Reason for Consult:  Gangrene right 5th toe Requesting Physician:  Dr. Sander Radon MRN #:  [id number]  History of Present Illness: This is a 74 y.o. female who presented via EMS to [facility] on 12/05/20 following a syncopal episode at home. 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 On presentation she was additionally found to have right 5th toe gangrene. MRI shows some concerns for possible osteomyelitis. She was initiated on Vancomycin. ABIs were obtained consistent with mild arterial disease. She explains that she initially felt like there was a cut on her 5th toe that developed 1- 1.5 months ago. She has had several rounds of antibiotics. She says that it did improve some after taking the antibiotics. She also feels that in general it is looking better than it did several weeks ago. During recent hospitalizations she has been given various local wound care instructions from alcohol to neosporin to Epson salt soaks. She says it continued to progress despite trying to take care of it. She presently reports that she just has pain in the 5th toe. She denies any pain in her right foot or leg. She does not have any lower extremity claudication symptoms. She ambulates without assistance. She denies any numbness or tingling in her feet.    Her risk factors include hypertension, hyperlipidemia, CKD,and she is a former smoker  Vascular surgery has consulted to see and evaluate her for peripheral arterial disease  Past Medical History:  Diagnosis Date  . Constipation 07/17/2017  . GERD (gastroesophageal reflux disease)   . Gout   . Grade II diastolic dysfunction 78/05/7543  . Hyperlipidemia   . Hypertension   . Hypokalemia   . OSA (obstructive sleep apnea) 08/09/2020  . PAF (paroxysmal atrial fibrillation) ([place])   .  PAF (paroxysmal atrial fibrillation) ([place])   . PUD (peptic ulcer disease)   . Syncope    Neurocardiogenic    Past Surgical History:  Procedure Laterality Date  . ABDOMINAL HYSTERECTOMY    . CATARACT EXTRACTION Bilateral   . COLONOSCOPY N/A 10/07/2014   Dr. Oneida Alar: redundant left colon, moderate sized external hemorrhoids   . COLONOSCOPY N/A 03/11/2020   Procedure: COLONOSCOPY;  Surgeon: Danie Binder, MD;  [facility];  Service: Endoscopy;  Laterality: N/A;  9:30am w/ overtube  . ENTEROSCOPY N/A 11/27/2020   Procedure: ENTEROSCOPY;  Surgeon: Harvel Quale, MD;  Location: [facility];  Service: Gastroenterology;  Laterality: N/A;  . ESOPHAGOGASTRODUODENOSCOPY (EGD) WITH PROPOFOL N/A 11/25/2020   Procedure: ESOPHAGOGASTRODUODENOSCOPY (EGD) WITH PROPOFOL;  Surgeon: Daneil Dolin, MD;  [facility];  Service: Endoscopy;  Laterality: N/A;  . GIVENS CAPSULE STUDY N/A 11/25/2020   Procedure: GIVENS CAPSULE STUDY;  Surgeon: Daneil Dolin, MD;  [facility];  Service: Endoscopy;  Laterality: N/A;  possible capsule if IDA unexplained by EGD findings.   Marland Kitchen HOT HEMOSTASIS  11/27/2020   Procedure: HOT HEMOSTASIS (ARGON PLASMA COAGULATION/BICAP);  Surgeon: Montez Morita, Quillian Quince, MD;  [facility];  Service: Gastroenterology;;  . LIPOMA RESECTION    . POLYPECTOMY  03/11/2020   Procedure: POLYPECTOMY;  Surgeon: Danie Binder, MD;  [facility];  Service: Endoscopy;;  cecal, transverse, descending, sigmoid    Allergies  Allergen Reactions  . Acetaminophen-Codeine Hives  . Allopurinol Hives  . Amlodipine  Felt she retained fluid in her chest   . Amoxicillin Hives    Did it involve swelling of the face/tongue/throat, SOB, or low BP? No Did it involve sudden or severe rash/hives, skin peeling, or any reaction on the inside of your mouth or nose? No Did you need to seek medical attention at a hospital or doctor's office?  No When did it last happen?10 + years If all above answers are "NO", may proceed with cephalosporin use.   Marland Kitchen Doxycycline     " sick and weak"  . Tramadol Hives    Prior to Admission medications   Medication Sig Start Date End Date Taking? Authorizing Provider  apixaban (ELIQUIS) 5 MG TABS tablet Take 1 tablet (5 mg total) by mouth 2 (two) times daily. 07/25/20 07/20/21 Yes Satira Sark, MD  cetirizine (ZYRTEC) 10 MG tablet Take 10 mg by mouth daily as needed for allergies.   Yes [provider]  Cholecalciferol (VITAMIN D-3) 25 MCG (1000 UT) CAPS Take 1 capsule by mouth daily.   Yes [provider]  diclofenac Sodium (VOLTAREN) 1 % GEL Apply 1 application topically 4 (four) times daily as needed (pain).   Yes [provider]  ELDERBERRY PO Take 2 capsules by mouth daily.    Yes [provider]  ferrous sulfate 325 (65 FE) MG tablet Take 325 mg by mouth daily with breakfast.   Yes [provider]  flecainide (TAMBOCOR) 50 MG tablet Take 1 tablet (50 mg total) by mouth 2 (two) times daily. 11/29/20 12/29/20 Yes Satira Sark, MD  furosemide (LASIX) 40 MG tablet Take 1 tablet (40 mg total) by mouth daily. 10/27/20 01/25/21 Yes Darliss Cheney, MD  gabapentin (NEURONTIN) 300 MG capsule Take 300 mg by mouth 3 (three) times daily. 11/21/20  Yes [provider]  losartan (COZAAR) 25 MG tablet Take 0.5 tablets (12.5 mg total) by mouth daily. 10/27/20  Yes Pahwani, Einar Grad, MD  lubiprostone (AMITIZA) 24 MCG capsule Take 1 capsule (24 mcg total) by mouth in the morning and at bedtime. 09/15/20 09/15/21 Yes Carver, Elon Alas, DO  omeprazole (PRILOSEC) 40 MG capsule Take 40 mg by mouth daily.     Yes [provider]  potassium chloride SA (KLOR-CON) 20 MEQ tablet Take 20 mEq by mouth in the morning, at noon, and at bedtime.   Yes [provider]  vitamin C (ASCORBIC ACID) 250 MG tablet Take 500 mg by mouth daily.   Yes [provider]  acetaminophen (TYLENOL) 500 MG tablet Take 1,000 mg by mouth every 6 (six) hours as needed for moderate pain.    [provider]  albuterol (VENTOLIN HFA) 108 (90 Base) MCG/ACT inhaler Inhale 1-2 puffs into the lungs every 6 (six) hours as needed for wheezing or shortness of breath.  09/23/20   [provider]  cyclobenzaprine (FLEXERIL) 5 MG tablet Take 5 mg by mouth 3 (three) times daily as needed for muscle spasms. 06/19/20   [provider]  fluticasone (FLONASE) 50 MCG/ACT nasal spray Place 1 spray into both nostrils daily as needed for allergies or rhinitis.    [provider]  Fluticasone-Salmeterol (ADVAIR) 500-50 MCG/DOSE AEPB Inhale 1 puff into the lungs every 12 (twelve) hours.    [provider]  hydrALAZINE (APRESOLINE) 50 MG tablet Take 1 tablet (50 mg total) by mouth 3 (three) times daily. Patient not taking: No sig reported 10/25/20 11/24/20  Darliss Cheney, MD  meclizine (ANTIVERT) 25 MG tablet Take  25 mg by mouth 3 (three) times daily as needed for dizziness.    [provider]    Social History   Socioeconomic History  . Marital status: Single    Spouse name: Not on file  . Number of children: Not on file  . Years of education: Not on file  . Highest education level: Not on file  Occupational History  . Occupation: Retired  Tobacco Use  . Smoking status: Former Smoker    Packs/day: 1.00    Years: 20.00    Pack years: 20.00    Types: Cigarettes    Start date: 03/16/1964    Quit date: 10/07/1997    Years since quitting: 23.1  . Smokeless tobacco: Never Used  Vaping Use  . Vaping Use: Never used  Substance and Sexual Activity  . Alcohol use: No    Alcohol/week: 0.0 standard drinks  . Drug use: No  . Sexual activity: Yes    Partners: Male  Other Topics Concern  . Not on file  Social History Narrative  . Not on file   Social Determinants of Health   Financial Resource Strain: Not on file  Food  Insecurity: Not on file  Transportation Needs: Not on file  Physical Activity: Not on file  Stress: Not on file  Social Connections: Not on file  Intimate Partner Violence: Not on file     Family History  Problem Relation Age of Onset  . Stroke Mother   . Dementia Father   . Cancer - Other Sister   . Atrial fibrillation Sister   . Heart failure Sister   . Colon cancer Maternal Grandmother        diagnosed in her 90s    ROS: Otherwise negative unless mentioned in HPI  Physical Examination  Vitals:   12/09/20 0814 12/09/20 1326  BP:  (!) 156/66  Pulse:  64  Resp:  16  Temp:  98.5 F (36.9 C)  SpO2: 98% 100%   There is no height or weight on file to calculate BMI.  General:  WDWN in NAD Gait: Normal HENT: WNL, normocephalic Pulmonary: normal non-labored breathing\ Cardiac: regular, without  Murmurs, rubs or gallops; without carotid bruits Abdomen:  soft, NT/ND, no masses Vascular Exam/Pulses: 2+ femoral pulses bilaterally, not able to palpate distal pulses bilaterally. Feet are warm and well perfused Extremities: with ischemic changes of the right 5th toe, with dry Gangrene of distal right 5th toe , without cellulitis     Musculoskeletal: no muscle wasting or atrophy  Neurologic: A&O X 3;  No focal weakness or paresthesias are detected; speech is fluent/normal Psychiatric:  The pt has Normal affect.  CBC    Component Value Date/Time   WBC 6.2 12/09/2020 0020   RBC 2.82 (L) 12/09/2020 0020   HGB 7.6 (L) 12/09/2020 0020   HCT 24.7 (L) 12/09/2020 0020   PLT 301 12/09/2020 0020   MCV 87.6 12/09/2020 0020   MCH 27.0 12/09/2020 0020   MCHC 30.8 12/09/2020 0020   RDW 16.3 (H) 12/09/2020 0020   LYMPHSABS 1.6 12/05/2020 2018   MONOABS 0.6 12/05/2020 2018   EOSABS 0.4 12/05/2020 2018   BASOSABS 0.0 12/05/2020 2018    BMET    Component Value Date/Time   NA 135 12/09/2020 0020   K 3.1 (L) 12/09/2020 0020   CL 97 (L) 12/09/2020 0020   CO2 28 12/09/2020 0020    GLUCOSE 114 (H) 12/09/2020 0020   BUN 23 12/09/2020 0020   CREATININE 2.81 (  H) 12/09/2020 0020   CREATININE 1.17 (H) 05/06/2012 1000   CALCIUM 8.6 (L) 12/09/2020 0020   GFRNONAA 17 (L) 12/09/2020 0020   GFRAA 20 (L) 09/11/2020 0519    COAGS: Lab Results  Component Value Date   INR 1.5 (H) 12/06/2020   INR 1.4 (H) 11/24/2020   INR 1.8 (H) 10/20/2020     Non-Invasive Vascular Imaging:   12/06/20 Right ABI: 0.8 Left ABI :0.75  Statin:  No. Beta Blocker:  No. Aspirin:  No. ACEI:  No. ARB:  No. CCB use:  No Other antiplatelets/anticoagulants:  No.    ASSESSMENT/PLAN: This is a 74 y.o. 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 Her ABIs bilaterally are mildly abnormal. Her lower extremities are well perfused and warm. Her pedal pulses are not palpable but she has brisk doppler PT/ Pero/ DP signals except for the right DP is mildly diminished. She likely has some tibial disease. She is scheduled for Transfusion today with Hgb of 7.6. 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 I discussed with patient, her sister and also her daughter over the phone the recommendation to have an Arteriogram of her right lower extremity. This will likely need to be performed with combination of contrast and CO2 due to her renal function. She will need to continue to hold her Eliquis. Her angiogram with tentatively by planned for Tuesday 12/21. Patient was seen and evaluated with Dr. Carlis Abbott. He will provider further management details   [name] [facility] [phone] 12/09/2020  4:42 PM   I have seen and evaluated the patient. I agree with the PA note as documented above.  74 year old female that vascular surgery has been consulted for right fifth toe gangrene.  She states this has been present for about the last 1.5 months.  She was transferred  from [facility] where she presented with symptomatic anemia and acute upper GI bleed (she is also on Eliquis for afib) in addition to AKI on stage IV CKD.  She denies any rest pain or history of claudication.  She has nice femoral pulses that are palpable.  Unable to appreciate pedal pulses on exam.  ABIs are 0.8.  She would likely benefit from arteriogram next week and possible later toe amputation.  Unfortunately there is no time on the OR schedule on Monday so we will have to look later into the week.  We will need to watch her renal function and hemoglobin through the weekend.  Marty Heck, MD [facility] [place] Office: [phone]

## 2020-12-10 LAB — BPAM RBC
Blood Product Expiration Date: 202201172359
ISSUE DATE / TIME: 202112172008
Unit Type and Rh: 5100

## 2020-12-10 LAB — CBC WITH DIFFERENTIAL/PLATELET
Abs Immature Granulocytes: 0.01 10*3/uL (ref 0.00–0.07)
Basophils Absolute: 0.1 10*3/uL (ref 0.0–0.1)
Basophils Relative: 1 %
Eosinophils Absolute: 0.7 10*3/uL — ABNORMAL HIGH (ref 0.0–0.5)
Eosinophils Relative: 13 %
HCT: 27.7 % — ABNORMAL LOW (ref 36.0–46.0)
Hemoglobin: 9 g/dL — ABNORMAL LOW (ref 12.0–15.0)
Immature Granulocytes: 0 %
Lymphocytes Relative: 28 %
Lymphs Abs: 1.6 10*3/uL (ref 0.7–4.0)
MCH: 27.8 pg (ref 26.0–34.0)
MCHC: 32.5 g/dL (ref 30.0–36.0)
MCV: 85.5 fL (ref 80.0–100.0)
Monocytes Absolute: 0.7 10*3/uL (ref 0.1–1.0)
Monocytes Relative: 13 %
Neutro Abs: 2.7 10*3/uL (ref 1.7–7.7)
Neutrophils Relative %: 45 %
Platelets: 335 10*3/uL (ref 150–400)
RBC: 3.24 MIL/uL — ABNORMAL LOW (ref 3.87–5.11)
RDW: 16 % — ABNORMAL HIGH (ref 11.5–15.5)
WBC: 5.8 10*3/uL (ref 4.0–10.5)
nRBC: 0 % (ref 0.0–0.2)

## 2020-12-10 LAB — TYPE AND SCREEN
ABO/RH(D): O POS
Antibody Screen: NEGATIVE
Unit division: 0

## 2020-12-10 LAB — BASIC METABOLIC PANEL
Anion gap: 14 (ref 5–15)
BUN: 18 mg/dL (ref 8–23)
CO2: 26 mmol/L (ref 22–32)
Calcium: 8.8 mg/dL — ABNORMAL LOW (ref 8.9–10.3)
Chloride: 97 mmol/L — ABNORMAL LOW (ref 98–111)
Creatinine, Ser: 2.81 mg/dL — ABNORMAL HIGH (ref 0.44–1.00)
GFR, Estimated: 17 mL/min — ABNORMAL LOW (ref >60)
Glucose, Bld: 96 mg/dL (ref 70–99)
Potassium: 3.6 mmol/L (ref 3.5–5.1)
Sodium: 137 mmol/L (ref 135–145)

## 2020-12-10 MED ORDER — SODIUM CHLORIDE 0.9 % IV SOLN
125.0000 mg | Freq: Every day | INTRAVENOUS | Status: AC
  Administered 2020-12-10 – 2020-12-11 (×2): 125 mg via INTRAVENOUS
  Filled 2020-12-10 (×2): qty 10

## 2020-12-10 MED ORDER — PROSOURCE PLUS PO LIQD
30.0000 mL | Freq: Two times a day (BID) | ORAL | Status: DC
  Administered 2020-12-11 – 2020-12-15 (×6): 30 mL via ORAL
  Filled 2020-12-10 (×7): qty 30

## 2020-12-10 MED ORDER — ENSURE ENLIVE PO LIQD
237.0000 mL | Freq: Two times a day (BID) | ORAL | Status: DC
  Administered 2020-12-11 – 2020-12-15 (×6): 237 mL via ORAL

## 2020-12-10 NOTE — Evaluation (Signed)
Occupational Therapy Evaluation Patient Details Name: [name] [name] MRN: [id number] DOB: 12/10/1946 Today's Date: 12/10/2020    History of Present Illness 74 year old female with past medical history for paroxysmal atrial fibrillation, hypertension, dyslipidemia, diastolic heart failure, and GERD.  Patient developed a syncope episode, preceded by fatigue for several days.   Clinical Impression   Patient admitted for the follow diagnosis.  PTA she lived alone, and was independent with all care and mobility.  She continues to drive.  Her primary deficit is pain associated with her R foot.  She has a noticeable limp, but it does not impact her ADL, toileting, or in room mobility.  She is reaching for objects in her environment to steady herself, but has had no LOB in the acute setting.  No real OT needs in the acute setting, she is not interested in [facility] services.  Her sister is able to stay with her if needed.      Follow Up Recommendations  No OT follow up    Equipment Recommendations  None recommended by OT    Recommendations for Other Services       Precautions / Restrictions Precautions Precautions: Fall Restrictions Weight Bearing Restrictions: No      Mobility Bed Mobility Overal bed mobility: Independent                  Transfers Overall transfer level: Modified independent               General transfer comment: patient with limb due to 5th digit to R foot.  Reaches for objects in her environment, but declines use of RW.  Able to walk to bathroom on her own.    Balance Overall balance assessment: Mild deficits observed, not formally tested                                         ADL either performed or assessed with clinical judgement   ADL Overall ADL's : At baseline                                       General ADL Comments: Patient able to bathe and dress herself sink side.  States she has been walking herself  to the bathroom without assist.     Vision Patient Visual Report: No change from baseline                  Pertinent Vitals/Pain Pain Assessment: Faces Faces Pain Scale: Hurts little more Pain Location: R foot Pain Descriptors / Indicators: Burning Pain Intervention(s): Monitored during session     Hand Dominance Right   Extremity/Trunk Assessment Upper Extremity Assessment Upper Extremity Assessment: Overall WFL for tasks assessed   Lower Extremity Assessment Lower Extremity Assessment: Defer to PT evaluation       Communication Communication Communication: No difficulties   Cognition Arousal/Alertness: Awake/alert Behavior During Therapy: WFL for tasks assessed/performed Overall Cognitive Status: Within Functional Limits for tasks assessed                                     General Comments   No dizziness expressed.  HGB 9.0    Exercises     Shoulder Instructions  Home Living Family/patient expects to be discharged to:: Private residence Living Arrangements: Alone Available Help at Discharge: Family;Available PRN/intermittently Type of Home: House Home Access: Stairs to enter [employer] of Steps: 1   Home Layout: One level     Bathroom Shower/Tub: Teacher, early years/pre: Standard     Home Equipment: None          Prior Functioning/Environment Level of Independence: Independent                 OT Problem List: Pain      OT Treatment/Interventions:      OT Goals(Current goals can be found in the care plan section) Acute Rehab OT Goals Patient Stated Goal: Hoping they don't have to amputate my toe OT Goal Formulation: With patient Time For Goal Achievement: 12/10/20 Potential to Achieve Goals: Fair  OT Frequency:     Barriers to D/C:  None noted          Co-evaluation              AM-PAC OT "6 Clicks" Daily Activity     Outcome Measure Help from another person eating  meals?: None Help from another person taking care of personal grooming?: None Help from another person toileting, which includes using toliet, bedpan, or urinal?: None Help from another person bathing (including washing, rinsing, drying)?: None Help from another person to put on and taking off regular upper body clothing?: None Help from another person to put on and taking off regular lower body clothing?: None 6 Click Score: 24   End of Session Equipment Utilized During Treatment: Gait belt Nurse Communication: Mobility status  Activity Tolerance: Patient tolerated treatment well Patient left: in chair;with call bell/phone within reach  OT Visit Diagnosis: Pain Pain - Right/Left: Right Pain - part of body: Ankle and joints of foot                Time: 0947-0962 OT Time Calculation (min): 19 min Charges:  OT General Charges $OT Visit: 1 Visit OT Evaluation $OT Eval Moderate Complexity: 1 Mod  12/10/2020  Rich, OTR/L  Acute Rehabilitation Services  Office:  [place] 12/10/2020, 4:27 PM

## 2020-12-10 NOTE — Progress Notes (Addendum)
PROGRESS NOTE    [name] [name]  DJM:[id number] DOB: 1946/04/29 DOA: 12/05/2020 PCP: Andres Shad, MD    Brief Narrative:  [name] [name] was admitted to the hospital working diagnosis of acute symptomatic anemia. Transferred to [facility] 12[facility]08/20 for vascular surgery evaluation dry gangrene right foot 5th toe.   74 year old female with past medical history for paroxysmal atrial fibrillation, hypertension, dyslipidemia, diastolic heart failure, and GERD.  Patient developed a syncope episode, preceded by fatigue for several days.  On her initial physical examination temperature 98.8, heart rate 79, respiratory rate 15, blood pressure 140/66, her lungs are clear to auscultation bilaterally, heart S1-S2, present rhythmic, her abdomen was soft, no lower extremity edema.  Sodium 129, potassium 3.6, chloride 92, bicarb 27, glucose 119, BUN 44, creatinine 3.89, white count 6.7, hemoglobin 7.0, hematocrit 23.3, platelets 310.  SARS COVID-19 negative. Chest radiograph with mild cardiomegaly, no infiltrates. EKG 71 bpm, normal axis, normal intervals, sinus with sinus arrhythmia, no ST segment or T wave changes.  Patient received #1 PRBC cell transfusion, placed on intravenous pantoprazole and octreotide.  Apixaban was held.  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   Further work up with foot MRI showed nonspecific findings, concerning for possible osteomyelitis.  ABI were consistent with mild to moderate peripheral vascular disease. General surgery consulted, recommendations to transfer to [facility] for further vascular surgery evaluation.  Patient received second unit PRBC 12/17 with good toleration.   Vascular surgery will perform arteriogram and possible toe amputation.   Assessment & Plan:   Principal Problem:   GI bleed Active Problems:   Rectal bleeding   Paroxysmal atrial fibrillation (HCC)   Hyponatremia    Upper GI bleed   Dry gangrene ([place])    1. 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  Sp 2 units PRBC Today hgb up to 9.0 with hct 27.7, Iron stores with serum iron 11, TIBC 342, Transferrin saturation 3 and ferritin 71.   Will give ferrous gluconate IV x2 doses and continue close follow up on hgb and hct.   On sq octreotide per GI recommendations.   2. 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  MRI with 5th distal phalanx changes suggestive of osteomyelitis   Continue with  IV vancomycin, further workup with arteriogram and possibel surgery intervention on this hospitalization.  Consult PT and OT, out of bed to chair tid with meals.   3. Paroxysmal atrial fibrillation.  Rhythm control with flecainide. Continue holding anticoagulation due to GI bleeding and acute anemia.   4. 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  Stable renal function with serum cr today at 2,81, with K at 3,6 and bicarbonate at 26. Continue close follow up of renal function, avoid hypotension or nephrotoxic medications, currently she is eucolemic.  Continue home dose of furosemide 40 mg per day.   5. Anxiety. On PRN lorazepam.   6. COPD. On dulera and as needed albuterol.   7. Mild protein calorie malnutrition. Follow nutrition recommendations.   8. HTN. Holding hydralazine and losartan to prevent hypotension.   Patient has high risk for worsening gangrene  Status is: Inpatient  Remains inpatient appropriate because:IV treatments appropriate due to intensity of illness or inability to take PO   Dispo: The patient is from: Home  Anticipated d/c is to: Home              Anticipated d/c date is: > 3 days              Patient currently is not medically stable to d/c.   DVT prophylaxis: scd    Code Status:    full Family  Communication:  No family at the bedside      Consultants:   Vascular surgery     Antimicrobials:   IV vancomycin     Subjective: Patient with controlled pain at the right foot, no nausea or vomiting, no dyspnea or chest pain.   Objective: Vitals:   12/09/20 2329 12/10/20 0500 12/10/20 0512 12/10/20 0740  BP: (!) 139/54  134/67   Pulse: (!) 57  (!) 53   Resp: 20  18   Temp: 98.5 F (36.9 C)  98.2 F (36.8 C)   TempSrc: Oral     SpO2: 100%  100% 100%  Weight:  74.7 kg      Intake/Output Summary (Last 24 hours) at 12/10/2020 1158 Last data filed at 12/10/2020 0400 Gross per 24 hour  Intake 750 ml  Output --  Net 750 ml   Filed Weights   12/10/20 0500  Weight: 74.7 kg    Examination:   General: Not in pain or dyspnea. Deconditioned  Neurology: Awake and alert, non focal  E ENT: no pallor, no icterus, oral mucosa moist Cardiovascular: No JVD. S1-S2 present, rhythmic, no gallops, rubs, or murmurs. No lower extremity edema. Pulmonary: vesicular breath sounds bilaterally, adequate air movement, no wheezing, rhonchi or rales. Gastrointestinal. Abdomen soft and non tender Skin. Right foot with dressing in place.  Musculoskeletal: no joint deformities     Data Reviewed: I have personally reviewed following labs and imaging studies  CBC: Recent Labs  Lab 12/05/20 2018 12/06/20 0354 12/07/20 0416 12/08/20 0542 12/09/20 0020 12/10/20 0414  WBC 6.7 6.1 6.9 7.0 6.2 5.8  NEUTROABS 4.0  --   --   --   --  2.7  HGB 7.0* 7.6* 8.3* 7.5* 7.6* 9.0*  HCT 23.3* 25.6* 27.1* 25.0* 24.7* 27.7*  MCV 90.3 88.9 90.0 90.3 87.6 85.5  PLT 310 281 315 282 301 324   Basic Metabolic Panel: Recent Labs  Lab 12/06/20 0354 12/07/20 0416 12/08/20 0542 12/09/20 0020 12/10/20 0414  NA 131* 132* 132* 135 137  K 3.9 3.6 3.7 3.1* 3.6  CL 97* 95* 98 97* 97*  CO2 26 27 25 28 26   GLUCOSE 129* 122* 125* 114* 96  BUN 46* 38* 28* 23 18  CREATININE 3.79* 3.46* 2.89* 2.81* 2.81*   CALCIUM 8.6* 8.6* 8.5* 8.6* 8.8*  MG 2.9*  --  2.2  --   --   PHOS  --  4.2 2.9 3.3  --    GFR: Estimated Creatinine Clearance: 16.6 mL/min (A) (by C-G formula based on SCr of 2.81 mg/dL (H)). Liver Function Tests: Recent Labs  Lab 12/05/20 2018 12/06/20 0354 12/07/20 0416 12/08/20 0542 12/09/20 0020  AST 22 14*  --   --   --   ALT 10 10  --   --   --   ALKPHOS 62 59  --   --   --   BILITOT 0.3 0.6  --   --   --   PROT 6.2* 5.9*  --   --   --   ALBUMIN 3.2* 3.2* 3.5 3.2* 2.8*   No results for input(s): LIPASE,  AMYLASE in the last 168 hours. No results for input(s): AMMONIA in the last 168 hours. Coagulation Profile: Recent Labs  Lab 12/06/20 0354  INR 1.5*   Cardiac Enzymes: No results for input(s): CKTOTAL, CKMB, CKMBINDEX, TROPONINI in the last 168 hours. BNP (last 3 results) No results for input(s): PROBNP in the last 8760 hours. HbA1C: No results for input(s): HGBA1C in the last 72 hours. CBG: No results for input(s): GLUCAP in the last 168 hours. Lipid Profile: No results for input(s): CHOL, HDL, LDLCALC, TRIG, CHOLHDL, LDLDIRECT in the last 72 hours. Thyroid Function Tests: No results for input(s): TSH, T4TOTAL, FREET4, T3FREE, THYROIDAB in the last 72 hours. Anemia Panel: Recent Labs    12/09/20 1550  FERRITIN 71  TIBC 342  IRON 11*      Radiology Studies: I have reviewed all of the imaging during this hospital visit personally     Scheduled Meds: . sodium chloride   Intravenous Once  . ferrous sulfate  325 mg Oral Q breakfast  . flecainide  50 mg Oral BID  . furosemide  40 mg Oral Daily  . lubiprostone  24 mcg Oral BID WC  . mometasone-formoterol  2 puff Inhalation BID  . octreotide  100 mcg Subcutaneous Q12H  . pantoprazole  40 mg Oral BID AC   Continuous Infusions: . [START ON 12/11/2020] vancomycin       LOS: 5 days        Scout Guyett Gerome Apley, MD

## 2020-12-10 NOTE — Progress Notes (Signed)
Initial Nutrition Assessment  DOCUMENTATION CODES:   Obesity unspecified  INTERVENTION:  Provide Ensure Enlive po BID, each supplement provides 350 kcal and 20 grams of protein.  Provide 30 ml Prosource plus po BID, each supplement provides 100 kcal and 15 grams of protein.   Encourage adequate PO intake.  NUTRITION DIAGNOSIS:   Increased nutrient needs related to wound healing as evidenced by estimated needs.  GOAL:   Patient will meet greater than or equal to 90% of their needs  MONITOR:   PO intake,Supplement acceptance,Skin,Weight trends,Labs,I & O's  REASON FOR ASSESSMENT:   Consult Assessment of nutrition requirement/status  ASSESSMENT:   74 year old female with past medical history for paroxysmal atrial fibrillation, hypertension, dyslipidemia, diastolic heart failure, and GERD presents with fatigue. 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 Pt with Right foot 5th toe gangrene in the setting or peripheral vascular disease.  Pt unavailable during attempted time of contact. Meal completion has been 40-50%. Weight fluctuating per weight records likely related to fluid status. Per MD, plan for arteriogram with possible toe amputation. RD to order nutritional supplements to aid in caloric and protein needs as well as in wound healing.   Unable to complete Nutrition-Focused physical exam at this time.   Labs and medications reviewed.   Diet Order:   Diet Order            Diet Heart Room service appropriate? Yes; Fluid consistency: Thin  Diet effective now                 EDUCATION NEEDS:   Not appropriate for education at this time  Skin:  Skin Assessment: Reviewed RN Assessment  Last BM:  12/16  Height:   Ht Readings from Last 1 Encounters:  11/27/20 5\' 2"  (1.575 m)    Weight:   Wt Readings from Last 1 Encounters:  12/10/20  74.7 kg    BMI:  Body mass index is 30.12 kg/m.  Estimated Nutritional Needs:   Kcal:  2585-2778  Protein:  100-110 grams  Fluid:  >/= 1.9 L/day   Corrin Parker, MS, RD, LDN RD pager number/after hours weekend pager number on Amion.

## 2020-12-11 DIAGNOSIS — E876 Hypokalemia: Secondary | ICD-10-CM

## 2020-12-11 LAB — BASIC METABOLIC PANEL
Anion gap: 14 (ref 5–15)
BUN: 18 mg/dL (ref 8–23)
CO2: 27 mmol/L (ref 22–32)
Calcium: 8.8 mg/dL — ABNORMAL LOW (ref 8.9–10.3)
Chloride: 97 mmol/L — ABNORMAL LOW (ref 98–111)
Creatinine, Ser: 2.84 mg/dL — ABNORMAL HIGH (ref 0.44–1.00)
GFR, Estimated: 17 mL/min — ABNORMAL LOW (ref >60)
Glucose, Bld: 108 mg/dL — ABNORMAL HIGH (ref 70–99)
Potassium: 3.1 mmol/L — ABNORMAL LOW (ref 3.5–5.1)
Sodium: 138 mmol/L (ref 135–145)

## 2020-12-11 MED ORDER — FENTANYL CITRATE (PF) 100 MCG/2ML IJ SOLN
25.0000 ug | Freq: Once | INTRAMUSCULAR | Status: AC
Start: 2020-12-11 — End: 2020-12-11
  Administered 2020-12-11: 25 ug via INTRAVENOUS
  Filled 2020-12-11: qty 2

## 2020-12-11 MED ORDER — POTASSIUM CHLORIDE CRYS ER 20 MEQ PO TBCR
40.0000 meq | EXTENDED_RELEASE_TABLET | Freq: Once | ORAL | Status: AC
  Administered 2020-12-11: 40 meq via ORAL
  Filled 2020-12-11: qty 2

## 2020-12-11 NOTE — Progress Notes (Signed)
PROGRESS NOTE    [name] [name]  JGO:[id number] DOB: January 28, 1946 DOA: 12/05/2020 PCP: Andres Shad, MD    Brief Narrative:  [name] admitted to the hospital working diagnosis of acute symptomatic anemia. Transferred to [facility] from [facility] 12/08/20 for vascular surgery evaluation dry gangrene right foot 18th toe.  74 year old female with past medical history for paroxysmal atrial fibrillation, hypertension, dyslipidemia, diastolic heart failure, and GERD. Patient developed a syncope episode, preceded by fatigue for several days.On her initial physical examination temperature 98.8, heart rate 79, respiratory rate 15, blood pressure 140/66, her lungs are clear to auscultation bilaterally, heart S1-S2, present rhythmic, her abdomen was soft, no lower extremity edema.  Sodium 129, potassium 3.6, chloride 92, bicarb 27, glucose 119, BUN 44, creatinine 3.89, white count 6.7, hemoglobin 7.0, hematocrit 23.3, platelets 310. SARS COVID-19 negative. Chest radiograph with mild cardiomegaly, no infiltrates. EKG 71 bpm, normal axis, normal intervals, sinus with sinus arrhythmia, no ST segment or T wave changes.  Patient received#1 PRBCcell transfusion, placed on intravenous pantoprazole and octreotide. Apixaban was held. 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   Further work up with foot MRIshowed nonspecific findings, concerning for possible osteomyelitis. ABI were consistent with mild to moderate peripheral vascular disease. General surgery consulted[facility].  Patient received second unit PRBC 12/17 with good toleration.   Vascular surgery will perform arteriogram and possible toe amputation   Assessment & Plan:   Principal Problem:   GI bleed Active Problems:   Rectal bleeding   Paroxysmal atrial fibrillation (HCC)   Hyponatremia    Upper GI bleed   Dry gangrene ([place])    1. 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  Sp 2 units PRBC Iron stores with serum iron 11, TIBC 342, Transferrin saturation 3 and ferritin 71.   Completed ferrous gluconate IV x2 doses  Continue with sq octreotide per GI recommendations.  Patient is tolerating po well.   2. 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  MRI with 5th distal phalanx changes suggestive of osteomyelitis   Antibiotic therapy with  IV vancomycin, plan for aortogram on 12/14/20. Continue to follow up vascular surgery recommendations, patient will likely require surgery intervention.  On gabapentin and acetaminophen.   3. Paroxysmal atrial fibrillation.  continue with rhythm control with flecainide. Not on anticoagulation due to GI bleeding.   4. 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  Stable renal function with serum cr at 2,84 with K down to 3,1 and bicarbonate at 27. Add 40 meq Kcl today and hold on furosemide for now, follow up on renal function in am. Avoid hypotension and nephrotoxic medications.   5. Anxiety. Continue with PRN lorazepam.   6. COPD. Continue with dulera and as needed albuterol.  7. Mild protein calorie malnutrition. Nutrition recommendations.  8. HTN. Stable blood pressure with systolic 559 to 741 mmHg, keep less than 638 mmHg systolic while inpatient.     Status is: Inpatient  Remains inpatient appropriate because:IV treatments appropriate due to intensity of illness or inability to take PO   Dispo: The patient is from: Home              Anticipated d/c is to: Home              Anticipated d/c date  is: 3 days              Patient currently is not medically stable to d/c.   DVT prophylaxis: scd  (gi bleeding)  Code Status:   full  Family Communication:  No  family at the bedside      Nutrition Status: Nutrition Problem: Increased nutrient needs Etiology: wound healing Signs/Symptoms: estimated needs Interventions: Ensure Enlive (each supplement provides 350kcal and 20 grams of protein),Prostat     Skin Documentation:     Consultants:   GI   Surgery   Vascular surgery    Antimicrobials:   IV vancomycin     Subjective: Patient with no nausea or vomiting, no dyspnea or chest pain. Tolerating po well, right foot pain is well controlled.   Objective: Vitals:   12/10/20 1411 12/10/20 2130 12/11/20 0309 12/11/20 0426  BP: (!) 172/70 (!) 145/65 (!) 143/62   Pulse: 60 (!) 52 (!) 53   Resp: 18 16 17    Temp: 98.4 F (36.9 C) 98 F (36.7 C) 98.1 F (36.7 C)   TempSrc: Oral Oral Oral   SpO2: 100% 98% 99%   Weight:    74.7 kg   No intake or output data in the 24 hours ending 12/11/20 1235 Filed Weights   12/10/20 0500 12/11/20 0426  Weight: 74.7 kg 74.7 kg    Examination:   General: Not in pain or dyspnea, deconditioned  Neurology: Awake and alert, non focal  E ENT: mild pallor, no icterus, oral mucosa moist Cardiovascular: No JVD. S1-S2 present, rhythmic, no gallops, rubs, or murmurs. No lower extremity edema. Pulmonary: positive breath sounds bilaterally, adequate air movement, no wheezing, rhonchi or rales. Gastrointestinal. Abdomen soft and non tender Skin. No rashes Musculoskeletal: right foot with dressing in place.     Data Reviewed: I have personally reviewed following labs and imaging studies  CBC: Recent Labs  Lab 12/05/20 2018 12/06/20 0354 12/07/20 0416 12/08/20 0542 12/09/20 0020 12/10/20 0414  WBC 6.7 6.1 6.9 7.0 6.2 5.8  NEUTROABS 4.0  --   --   --   --  2.7  HGB 7.0* 7.6* 8.3* 7.5* 7.6* 9.0*  HCT 23.3* 25.6* 27.1* 25.0* 24.7* 27.7*  MCV 90.3 88.9 90.0 90.3 87.6 85.5  PLT 310 281 315 282 301 865   Basic Metabolic Panel: Recent Labs  Lab 12/06/20 0354 12/07/20 0416 12/08/20 0542  12/09/20 0020 12/10/20 0414 12/11/20 0157  NA 131* 132* 132* 135 137 138  K 3.9 3.6 3.7 3.1* 3.6 3.1*  CL 97* 95* 98 97* 97* 97*  CO2 26 27 25 28 26 27   GLUCOSE 129* 122* 125* 114* 96 108*  BUN 46* 38* 28* 23 18 18   CREATININE 3.79* 3.46* 2.89* 2.81* 2.81* 2.84*  CALCIUM 8.6* 8.6* 8.5* 8.6* 8.8* 8.8*  MG 2.9*  --  2.2  --   --   --   PHOS  --  4.2 2.9 3.3  --   --    GFR: Estimated Creatinine Clearance: 16.4 mL/min (A) (by C-G formula based on SCr of 2.84 mg/dL (H)). Liver Function Tests: Recent Labs  Lab 12/05/20 2018 12/06/20 0354 12/07/20 0416 12/08/20 0542 12/09/20 0020  AST 22 14*  --   --   --   ALT 10 10  --   --   --   ALKPHOS 62 59  --   --   --   BILITOT 0.3 0.6  --   --   --   PROT  6.2* 5.9*  --   --   --   ALBUMIN 3.2* 3.2* 3.5 3.2* 2.8*   No results for input(s): LIPASE, AMYLASE in the last 168 hours. No results for input(s): AMMONIA in the last 168 hours. Coagulation Profile: Recent Labs  Lab 12/06/20 0354  INR 1.5*   Cardiac Enzymes: No results for input(s): CKTOTAL, CKMB, CKMBINDEX, TROPONINI in the last 168 hours. BNP (last 3 results) No results for input(s): PROBNP in the last 8760 hours. HbA1C: No results for input(s): HGBA1C in the last 72 hours. CBG: No results for input(s): GLUCAP in the last 168 hours. Lipid Profile: No results for input(s): CHOL, HDL, LDLCALC, TRIG, CHOLHDL, LDLDIRECT in the last 72 hours. Thyroid Function Tests: No results for input(s): TSH, T4TOTAL, FREET4, T3FREE, THYROIDAB in the last 72 hours. Anemia Panel: Recent Labs    12/09/20 1550  FERRITIN 71  TIBC 342  IRON 11*      Radiology Studies: I have reviewed all of the imaging during this hospital visit personally     Scheduled Meds: . (feeding supplement) PROSource Plus  30 mL Oral BID BM  . sodium chloride   Intravenous Once  . feeding supplement  237 mL Oral BID BM  . flecainide  50 mg Oral BID  . furosemide  40 mg Oral Daily  . lubiprostone  24  mcg Oral BID WC  . mometasone-formoterol  2 puff Inhalation BID  . octreotide  100 mcg Subcutaneous Q12H  . pantoprazole  40 mg Oral BID AC   Continuous Infusions: . vancomycin       LOS: 6 days        Denvil Canning Gerome Apley, MD

## 2020-12-11 NOTE — Evaluation (Signed)
Physical Therapy Evaluation Patient Details Name: [name] [name] MRN: [id number] DOB: 04-Feb-1946 Today's Date: 12/11/2020   History of Present Illness  74 year old female with past medical history for paroxysmal atrial fibrillation, hypertension, dyslipidemia, diastolic heart failure, and GERD.  Patient developed a syncope episode, preceded by fatigue for several days.  Clinical Impression   Pt admitted with above diagnosis. Comes from home where she lives alone in a single level house with one step to enter; Independent at baseline; Presents to PT with mild gait and balance dysfunction related to painful R foot 5th ray; Vascular to decide about possible interventions; Overall moving quite well, walked the hallways; Antalgic and assymetric, but walking well;  Pt currently with functional limitations due to the deficits listed below (see PT Problem List). Pt will benefit from skilled PT to increase their independence and safety with mobility to allow discharge to the venue listed below.       Follow Up Recommendations Outpatient PT;Other (comment) (Will depend on progress and hospital course; The potential need for Outpatient PT can be addressed at MD follow-up appointments. )    Equipment Recommendations  Rolling walker with 5" wheels;Kasandra Knudsen (will consider cane next session)    Recommendations for Other Services       Precautions / Restrictions Precautions Precautions: None      Mobility  Bed Mobility Overal bed mobility: Independent                  Transfers Overall transfer level: Modified independent Equipment used: Rolling walker (2 wheeled)             General transfer comment: Agreed to tyring RW this session; cues for hand placement; no difficulty with rise  Ambulation/Gait Ambulation/Gait assistance: Supervision Gait Distance (Feet): 150 Feet Assistive device: Rolling walker (2 wheeled);None Gait Pattern/deviations: Step-through pattern     General  Gait Details: Overall managing well; good use of RW as needed to Akins R foot in stance; Also walked without an assistive device -- noting more antalgic, but still moving well  Stairs            Wheelchair Mobility    Modified Rankin (Stroke Patients Only)       Balance Overall balance assessment: No apparent balance deficits (not formally assessed)                                           Pertinent Vitals/Pain Pain Assessment: Faces Faces Pain Scale: Hurts even more Pain Location: R foot Pain Descriptors / Indicators: Burning Pain Intervention(s): Monitored during session;Repositioned    Home Living Family/patient expects to be discharged to:: Private residence Living Arrangements: Alone Available Help at Discharge: Family;Available PRN/intermittently Type of Home: House Home Access: Stairs to enter   [employer] of Steps: 1 Home Layout: One level Home Equipment: None      Prior Function Level of Independence: Independent               Hand Dominance   Dominant Hand: Right    Extremity/Trunk Assessment   Upper Extremity Assessment Upper Extremity Assessment: Defer to OT evaluation    Lower Extremity Assessment Lower Extremity Assessment: RLE deficits/detail RLE Deficits / Details: Painful 5th ray, able to walk mostly on heel, or use RW to unweight R foot in stance       Communication   Communication: No difficulties  Cognition Arousal/Alertness:  Awake/alert Behavior During Therapy: WFL for tasks assessed/performed Overall Cognitive Status: Within Functional Limits for tasks assessed                                        General Comments      Exercises     Assessment/Plan    PT Assessment Patient needs continued PT services  PT Problem List Decreased activity tolerance;Decreased balance;Decreased mobility;Decreased knowledge of use of DME;Decreased knowledge of precautions;Pain        PT Treatment Interventions DME instruction;Gait training;Stair training;Therapeutic activities;Functional mobility training;Therapeutic exercise;Balance training;Patient/family education    PT Goals (Current goals can be found in the Care Plan section)  Acute Rehab PT Goals Patient Stated Goal: Hoping they don't have to amputate my toe PT Goal Formulation: With patient Time For Goal Achievement: 12/25/20 Potential to Achieve Goals: Good    Frequency Min 3X/week   Barriers to discharge        Co-evaluation               AM-PAC PT "6 Clicks" Mobility  Outcome Measure Help needed turning from your back to your side while in a flat bed without using bedrails?: None Help needed moving from lying on your back to sitting on the side of a flat bed without using bedrails?: None Help needed moving to and from a bed to a chair (including a wheelchair)?: None Help needed standing up from a chair using your arms (e.g., wheelchair or bedside chair)?: None Help needed to walk in hospital room?: None Help needed climbing 3-5 steps with a railing? : A Little 6 Click Score: 23    End of Session   Activity Tolerance: Patient tolerated treatment well Patient left: in chair;with call bell/phone within reach;with family/visitor present Nurse Communication: Mobility status PT Visit Diagnosis: Other abnormalities of gait and mobility (R26.89)    Time: 9798-9211 PT Time Calculation (min) (ACUTE ONLY): 24 min   Charges:   PT Evaluation $PT Eval Low Complexity: 1 Low PT Treatments $Gait Training: 8-22 mins        Roney Marion, PT  Acute Rehabilitation Services Pager [phone] Office [phone]   [place]ta [place] 12/11/2020, 5:09 PM

## 2020-12-11 NOTE — Progress Notes (Signed)
[facility]   Subjective  -no complaints.   Objective (!) 143/62 (!) 53 98.1 F (36.7 C) (Oral) 17 99% No intake or output data in the 24 hours ending 12/11/20 0913  Bilateral femoral pulses are palpable. I cannot appreciate a right popliteal pulse which is the side she has gangrenous right 5th toe  Laboratory Lab Results: Recent Labs    12/09/20 0020 12/10/20 0414  WBC 6.2 5.8  HGB 7.6* 9.0*  HCT 24.7* 27.7*  PLT 301 335   BMET Recent Labs    12/10/20 0414 12/11/20 0157  NA 137 138  K 3.6 3.1*  CL 97* 97*  CO2 26 27  GLUCOSE 96 108*  BUN 18 18  CREATININE 2.81* 2.84*  CALCIUM 8.8* 8.8*    COAG Lab Results  Component Value Date   INR 1.5 (H) 12/06/2020   INR 1.4 (H) 11/24/2020   INR 1.8 (H) 10/20/2020   No results found for: PTT  Assessment/Planning:  74 year old female transferred from [facility] 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  I can appreciate a femoral pulse but not a popliteal pulse.  Suspect she has infrainguinal disease potentially in the SFA popliteal.  Discussed with her this morning I will tentatively put her on the schedule for Tuesday for aortogram lower extremity arteriogram possible intervention with Dr. Trula Slade.  She is amenable to this plan.  Marty Heck 12/11/2020 9:13 AM --

## 2020-12-12 LAB — CBC WITH DIFFERENTIAL/PLATELET
Abs Immature Granulocytes: 0.03 10*3/uL (ref 0.00–0.07)
Basophils Absolute: 0.1 10*3/uL (ref 0.0–0.1)
Basophils Relative: 1 %
Eosinophils Absolute: 0.5 10*3/uL (ref 0.0–0.5)
Eosinophils Relative: 7 %
HCT: 30.8 % — ABNORMAL LOW (ref 36.0–46.0)
Hemoglobin: 9.5 g/dL — ABNORMAL LOW (ref 12.0–15.0)
Immature Granulocytes: 0 %
Lymphocytes Relative: 28 %
Lymphs Abs: 2.1 10*3/uL (ref 0.7–4.0)
MCH: 26.8 pg (ref 26.0–34.0)
MCHC: 30.8 g/dL (ref 30.0–36.0)
MCV: 86.8 fL (ref 80.0–100.0)
Monocytes Absolute: 0.8 10*3/uL (ref 0.1–1.0)
Monocytes Relative: 10 %
Neutro Abs: 4 10*3/uL (ref 1.7–7.7)
Neutrophils Relative %: 54 %
Platelets: 421 10*3/uL — ABNORMAL HIGH (ref 150–400)
RBC: 3.55 MIL/uL — ABNORMAL LOW (ref 3.87–5.11)
RDW: 15.7 % — ABNORMAL HIGH (ref 11.5–15.5)
WBC: 7.3 10*3/uL (ref 4.0–10.5)
nRBC: 0 % (ref 0.0–0.2)

## 2020-12-12 LAB — BASIC METABOLIC PANEL
Anion gap: 14 (ref 5–15)
BUN: 23 mg/dL (ref 8–23)
CO2: 27 mmol/L (ref 22–32)
Calcium: 8.8 mg/dL — ABNORMAL LOW (ref 8.9–10.3)
Chloride: 97 mmol/L — ABNORMAL LOW (ref 98–111)
Creatinine, Ser: 2.72 mg/dL — ABNORMAL HIGH (ref 0.44–1.00)
GFR, Estimated: 18 mL/min — ABNORMAL LOW (ref >60)
Glucose, Bld: 109 mg/dL — ABNORMAL HIGH (ref 70–99)
Potassium: 3.2 mmol/L — ABNORMAL LOW (ref 3.5–5.1)
Sodium: 138 mmol/L (ref 135–145)

## 2020-12-12 MED ORDER — GABAPENTIN 300 MG PO CAPS
300.0000 mg | ORAL_CAPSULE | Freq: Three times a day (TID) | ORAL | Status: DC | PRN
  Administered 2020-12-14: 300 mg via ORAL
  Filled 2020-12-12: qty 1

## 2020-12-12 MED ORDER — HYDROCODONE-ACETAMINOPHEN 5-325 MG PO TABS
1.0000 | ORAL_TABLET | Freq: Four times a day (QID) | ORAL | Status: DC | PRN
  Administered 2020-12-12 – 2020-12-14 (×5): 1 via ORAL
  Filled 2020-12-12 (×5): qty 1

## 2020-12-12 MED ORDER — POTASSIUM CHLORIDE CRYS ER 20 MEQ PO TBCR
40.0000 meq | EXTENDED_RELEASE_TABLET | Freq: Once | ORAL | Status: AC
  Administered 2020-12-12: 40 meq via ORAL
  Filled 2020-12-12: qty 2

## 2020-12-12 NOTE — Progress Notes (Signed)
PROGRESS NOTE    [name] [name]  SFK:[id number] DOB: 1946-03-22 DOA: 12/05/2020 PCP: Andres Shad, MD    Brief Narrative:  [name] [name] admitted to the hospital working diagnosis of acute symptomatic anemia due to upper GI bleed. Transferred to [facility] from [facility] 12/08/20 for vascular surgery evaluation dry gangrene right foot 48th toe.  74 year old female with past medical history for paroxysmal atrial fibrillation, hypertension, dyslipidemia, diastolic heart failure, and GERD. Patient developed a syncope episode, preceded by fatigue for several days.On her initial physical examination temperature 98.8, heart rate 79, respiratory rate 15, blood pressure 140/66, her lungs were clear to auscultation bilaterally, heart S1-S2, present rhythmic, her abdomen was soft, no lower extremity edema.  Sodium 129, potassium 3.6, chloride 92, bicarb 27, glucose 119, BUN 44, creatinine 3.89, white count 6.7, hemoglobin 7.0, hematocrit 23.3, platelets 310. SARS COVID-19 negative. Chest radiograph with mild cardiomegaly, no infiltrates. EKG 71 bpm, normal axis, normal intervals, sinus with sinus arrhythmia, no ST segment or T wave changes.  Patient received#1 PRBCcell transfusion, placed on intravenous pantoprazole and octreotide. Apixaban was held. 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   Further work up with foot MRIshowed nonspecific findings, concerning for possible osteomyelitis. ABI were consistent with mild to moderate peripheral vascular disease. General surgery consulted, [facility].  Patient received second unit PRBC 12/17 with good toleration.   Vascular surgery will perform arteriogram 12/21 and possible toe amputation the following day.     Assessment & Plan:   Principal Problem:   GI bleed Active Problems:   Rectal bleeding    Paroxysmal atrial fibrillation (HCC)   Hyponatremia   Upper GI bleed   Dry gangrene ([place])   1. 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  Sp2units PRBC Iron stores with serum iron 11, TIBC 342, Transferrin saturation 3 and ferritin 71. sp ferrous gluconate IV x2 doses   On sq octreotide per GI recommendations. No abdominal pain or melena, will check cell count in am  2. 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 MRI with 5th distal phalanx changes suggestive of osteomyelitis  Continue current antibiotic therapy withIV vancomycin, plan for aortogram on 12/13/20, and the following day surgical intervention.  Depending on surgical procedure findings decision will be made in regards of stoping antibiotic therapy.    Pain control with gabapentin (increase to her home dose of 300 mg po tid) and hydrocodone/ acetaminophen for break through pain control.  3. Paroxysmal atrial fibrillation.On flecainide for rhythm control. Currently not  on anticoagulation due to GI bleeding.   4. 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 Stable renal function with serum cr at 2,72, continue K correction with Kcl 40 meq po x1, continue to hold on furosemide.  Follow up renal function in am, patient will receive IV contrast for angiography. Avoid hypotension and nephrotoxic medications.   5. Anxiety.On PRNlorazepam with good toleration.    6. COPD.On dulera and as needed albuterol, no acute exacerbation   7. Mild protein calorie malnutrition.Continue with nutrition recommendations.  8. HTN. blood pressure this am continue to be well controlled, 1153/67 mmHg.    Patient continue to be at high risk for worsening foot gangrene.   Status is: Inpatient  Remains inpatient appropriate because:IV treatments appropriate due to  intensity of illness or inability  to take PO   Dispo: The patient is from: Home              Anticipated d/c is to: Home              Anticipated d/c date is: 3 days              Patient currently is not medically stable to d/c.   DVT prophylaxis: scd   Code Status:    full  Family Communication:  No family at the bedside      Nutrition Status: Nutrition Problem: Increased nutrient needs Etiology: wound healing Signs/Symptoms: estimated needs Interventions: Ensure Enlive (each supplement provides 350kcal and 20 grams of protein),Prostat     Consultants:   GI   Surgery   Vascular surgery   Antimicrobials:   IV vancomycin     Subjective: Patient has improved right foot pain with oral analgesics, no nausea or vomiting, no dyspnea or chest pain.   Objective: Vitals:   12/11/20 2122 12/12/20 0425 12/12/20 0438 12/12/20 0849  BP: (!) 151/74 (!) 153/67    Pulse: (!) 54 (!) 57    Resp: 16 16    Temp: 98.2 F (36.8 C) 98.2 F (36.8 C)    TempSrc: Oral Oral    SpO2: 100% 99%  98%  Weight:   74.1 kg     Intake/Output Summary (Last 24 hours) at 12/12/2020 0951 Last data filed at 12/12/2020 0500 Gross per 24 hour  Intake 480 ml  Output --  Net 480 ml   Filed Weights   12/10/20 0500 12/11/20 0426 12/12/20 0438  Weight: 74.7 kg 74.7 kg 74.1 kg    Examination:   General: Not in pain or dyspnea, deconditioned  Neurology: Awake and alert, non focal  E ENT: no pallor, no icterus, oral mucosa moist Cardiovascular: No JVD. S1-S2 present, rhythmic, no gallops, rubs, or murmurs. No lower extremity edema. Pulmonary: positive breath sounds bilaterally, adequate air movement, no wheezing, rhonchi or rales. Gastrointestinal. Abdomen soft and non tender Skin. Right foot with dressing in place.  Musculoskeletal: no deformities     Data Reviewed: I have personally reviewed following labs and imaging studies  CBC: Recent Labs  Lab 12/05/20 2018 12/06/20 0354  12/07/20 0416 12/08/20 0542 12/09/20 0020 12/10/20 0414  WBC 6.7 6.1 6.9 7.0 6.2 5.8  NEUTROABS 4.0  --   --   --   --  2.7  HGB 7.0* 7.6* 8.3* 7.5* 7.6* 9.0*  HCT 23.3* 25.6* 27.1* 25.0* 24.7* 27.7*  MCV 90.3 88.9 90.0 90.3 87.6 85.5  PLT 310 281 315 282 301 096   Basic Metabolic Panel: Recent Labs  Lab 12/06/20 0354 12/07/20 0416 12/08/20 0542 12/09/20 0020 12/10/20 0414 12/11/20 0157 12/12/20 0039  NA 131* 132* 132* 135 137 138 138  K 3.9 3.6 3.7 3.1* 3.6 3.1* 3.2*  CL 97* 95* 98 97* 97* 97* 97*  CO2 26 27 25 28 26 27 27   GLUCOSE 129* 122* 125* 114* 96 108* 109*  BUN 46* 38* 28* 23 18 18 23   CREATININE 3.79* 3.46* 2.89* 2.81* 2.81* 2.84* 2.72*  CALCIUM 8.6* 8.6* 8.5* 8.6* 8.8* 8.8* 8.8*  MG 2.9*  --  2.2  --   --   --   --   PHOS  --  4.2 2.9 3.3  --   --   --    GFR: Estimated Creatinine Clearance: 17.1 mL/min (A) (by C-G formula based on SCr of 2.72 mg/dL (H)).  Liver Function Tests: Recent Labs  Lab 12/05/20 2018 12/06/20 0354 12/07/20 0416 12/08/20 0542 12/09/20 0020  AST 22 14*  --   --   --   ALT 10 10  --   --   --   ALKPHOS 62 59  --   --   --   BILITOT 0.3 0.6  --   --   --   PROT 6.2* 5.9*  --   --   --   ALBUMIN 3.2* 3.2* 3.5 3.2* 2.8*   No results for input(s): LIPASE, AMYLASE in the last 168 hours. No results for input(s): AMMONIA in the last 168 hours. Coagulation Profile: Recent Labs  Lab 12/06/20 0354  INR 1.5*   Cardiac Enzymes: No results for input(s): CKTOTAL, CKMB, CKMBINDEX, TROPONINI in the last 168 hours. BNP (last 3 results) No results for input(s): PROBNP in the last 8760 hours. HbA1C: No results for input(s): HGBA1C in the last 72 hours. CBG: No results for input(s): GLUCAP in the last 168 hours. Lipid Profile: No results for input(s): CHOL, HDL, LDLCALC, TRIG, CHOLHDL, LDLDIRECT in the last 72 hours. Thyroid Function Tests: No results for input(s): TSH, T4TOTAL, FREET4, T3FREE, THYROIDAB in the last 72 hours. Anemia  Panel: Recent Labs    12/09/20 1550  FERRITIN 71  TIBC 342  IRON 11*      Radiology Studies: I have reviewed all of the imaging during this hospital visit personally     Scheduled Meds: . (feeding supplement) PROSource Plus  30 mL Oral BID BM  . sodium chloride   Intravenous Once  . feeding supplement  237 mL Oral BID BM  . flecainide  50 mg Oral BID  . lubiprostone  24 mcg Oral BID WC  . mometasone-formoterol  2 puff Inhalation BID  . octreotide  100 mcg Subcutaneous Q12H  . pantoprazole  40 mg Oral BID AC   Continuous Infusions: . vancomycin 1,000 mg (12/11/20 1657)     LOS: 7 days        [name] Sottile Gerome Apley, MD

## 2020-12-12 NOTE — Progress Notes (Addendum)
     A/P:gangrenous right 5th toe NPO order placed for aortogram lower extremity arteriogram possible intervention 12/13/20 by Dr. Trula Slade.   Roxy Horseman PA-C  I have seen and evaluated the patient. I agree with the PA note as documented above.  Discussed with Ms. Behrendt plan for aortogram, right lower extremity arteriogram, and possible intervention tomorrow with Dr. Trula Slade.  She has dry gangrene of the right fifth toe.  Ultimately if she requires amputation or bypass I will likely do that Wednesday  Marty Heck, MD [facility]: [phone]

## 2020-12-12 NOTE — Progress Notes (Signed)
Pharmacy Antibiotic Note  [name] [name] is a 74 y.o. female admitted on 12/05/2020 with osteomyelitis.  Pharmacy has been consulted for Vancomycin dosing.  The patient's renal function is slowly improving - q48h Vancomycin dosing remains appropriate for now. Noted that the patient has underlying CKD>   Plan: - Continue Vancomycin 1g IV every 48 hours - Will continue to follow renal function, culture results, LOT, and antibiotic de-escalation plans   Weight: 74.1 kg (163 lb 5.8 oz)  Temp (24hrs), Avg:98.2 F (36.8 C), Min:98.2 F (36.8 C), Max:98.2 F (36.8 C)  Recent Labs  Lab 12/07/20 0416 12/08/20 0542 12/09/20 0020 12/10/20 0414 12/11/20 0157 12/12/20 0039 12/12/20 1242  WBC 6.9 7.0 6.2 5.8  --   --  7.3  CREATININE 3.46* 2.89* 2.81* 2.81* 2.84* 2.72*  --     Estimated Creatinine Clearance: 17.1 mL/min (A) (by C-G formula based on SCr of 2.72 mg/dL (H)).    Allergies  Allergen Reactions  . Acetaminophen-Codeine Hives  . Allopurinol Hives  . Amlodipine     Felt she retained fluid in her chest   . Amoxicillin Hives    Did it involve swelling of the face/tongue/throat, SOB, or low BP? No Did it involve sudden or severe rash/hives, skin peeling, or any reaction on the inside of your mouth or nose? No Did you need to seek medical attention at a hospital or doctor's office? No When did it last happen?10 + years If all above answers are "NO", may proceed with cephalosporin use.   Marland Kitchen Doxycycline     " sick and weak"  . Tramadol Hives    Vancomycin 12/17 >>  12/13 Fluvid >> neg  Thank you for allowing pharmacy to be a part of this patient's care.  Alycia Rossetti, PharmD, BCPS Clinical Pharmacist Clinical phone for 12/12/2020: [phone] 12/12/2020 1:32 PM   **Pharmacist phone directory can now be found on [email or web address] ([email or web address]).  Listed under [place].

## 2020-12-13 ENCOUNTER — Inpatient Hospital Stay (HOSPITAL_COMMUNITY)
Admission: EM | Disposition: A | Discharge status: Home / Self Care | Payer: Self-pay | Source: Home / Self Care | Attending: Internal Medicine

## 2020-12-13 HISTORY — PX: PERIPHERAL VASCULAR INTERVENTION: CATH118257

## 2020-12-13 HISTORY — PX: ABDOMINAL AORTOGRAM W/LOWER EXTREMITY: CATH118223

## 2020-12-13 LAB — CBC
HCT: 29.8 % — ABNORMAL LOW (ref 36.0–46.0)
Hemoglobin: 9 g/dL — ABNORMAL LOW (ref 12.0–15.0)
MCH: 26.9 pg (ref 26.0–34.0)
MCHC: 30.2 g/dL (ref 30.0–36.0)
MCV: 89.2 fL (ref 80.0–100.0)
Platelets: 407 10*3/uL — ABNORMAL HIGH (ref 150–400)
RBC: 3.34 MIL/uL — ABNORMAL LOW (ref 3.87–5.11)
RDW: 16 % — ABNORMAL HIGH (ref 11.5–15.5)
WBC: 6.6 10*3/uL (ref 4.0–10.5)
nRBC: 0 % (ref 0.0–0.2)

## 2020-12-13 LAB — BASIC METABOLIC PANEL
Anion gap: 11 (ref 5–15)
BUN: 30 mg/dL — ABNORMAL HIGH (ref 8–23)
CO2: 27 mmol/L (ref 22–32)
Calcium: 8.8 mg/dL — ABNORMAL LOW (ref 8.9–10.3)
Chloride: 98 mmol/L (ref 98–111)
Creatinine, Ser: 2.73 mg/dL — ABNORMAL HIGH (ref 0.44–1.00)
GFR, Estimated: 18 mL/min — ABNORMAL LOW (ref >60)
Glucose, Bld: 106 mg/dL — ABNORMAL HIGH (ref 70–99)
Potassium: 3.6 mmol/L (ref 3.5–5.1)
Sodium: 136 mmol/L (ref 135–145)

## 2020-12-13 LAB — POCT ACTIVATED CLOTTING TIME
Activated Clotting Time: 273 seconds
Activated Clotting Time: 231 seconds
Activated Clotting Time: 184 seconds

## 2020-12-13 SURGERY — ABDOMINAL AORTOGRAM W/LOWER EXTREMITY
Anesthesia: LOCAL | Laterality: Right

## 2020-12-13 MED ORDER — SODIUM CHLORIDE 0.9 % IV SOLN: INTRAVENOUS | Status: AC

## 2020-12-13 MED ORDER — MIDAZOLAM HCL 2 MG/2ML IJ SOLN
INTRAMUSCULAR | Status: DC | PRN
  Administered 2020-12-13: 1 mg via INTRAVENOUS
  Administered 2020-12-13: 2 mg via INTRAVENOUS

## 2020-12-13 MED ORDER — HEPARIN SODIUM (PORCINE) 1000 UNIT/ML IJ SOLN
INTRAMUSCULAR | Status: DC | PRN
  Administered 2020-12-13: 7000 [IU] via INTRAVENOUS

## 2020-12-13 MED ORDER — NITROGLYCERIN 1 MG/10 ML FOR IR/CATH LAB
INTRA_ARTERIAL | Status: AC
  Filled 2020-12-13: qty 10

## 2020-12-13 MED ORDER — HEPARIN (PORCINE) IN NACL 1000-0.9 UT/500ML-% IV SOLN
INTRAVENOUS | Status: DC | PRN
  Administered 2020-12-13 (×2): 500 mL

## 2020-12-13 MED ORDER — CLOPIDOGREL BISULFATE 75 MG PO TABS
75.0000 mg | ORAL_TABLET | Freq: Every day | ORAL | Status: DC
  Administered 2020-12-14 – 2020-12-15 (×2): 75 mg via ORAL
  Filled 2020-12-13 (×2): qty 1

## 2020-12-13 MED ORDER — MIDAZOLAM HCL 2 MG/2ML IJ SOLN
INTRAMUSCULAR | Status: AC
  Filled 2020-12-13: qty 2

## 2020-12-13 MED ORDER — SODIUM CHLORIDE 0.9% FLUSH
3.0000 mL | Freq: Two times a day (BID) | INTRAVENOUS | Status: DC
  Administered 2020-12-14 – 2020-12-15 (×2): 3 mL via INTRAVENOUS

## 2020-12-13 MED ORDER — HEPARIN SODIUM (PORCINE) 1000 UNIT/ML IJ SOLN
INTRAMUSCULAR | Status: AC
  Filled 2020-12-13: qty 1

## 2020-12-13 MED ORDER — LIDOCAINE HCL (PF) 1 % IJ SOLN
INTRAMUSCULAR | Status: AC
  Filled 2020-12-13: qty 30

## 2020-12-13 MED ORDER — ONDANSETRON HCL 4 MG/2ML IJ SOLN: 4.0000 mg | Freq: Four times a day (QID) | INTRAMUSCULAR | Status: DC | PRN

## 2020-12-13 MED ORDER — FENTANYL CITRATE (PF) 100 MCG/2ML IJ SOLN
INTRAMUSCULAR | Status: AC
  Filled 2020-12-13: qty 2

## 2020-12-13 MED ORDER — HYDRALAZINE HCL 20 MG/ML IJ SOLN: 5.0000 mg | INTRAMUSCULAR | Status: DC | PRN

## 2020-12-13 MED ORDER — IODIXANOL 320 MG/ML IV SOLN
INTRAVENOUS | Status: DC | PRN
  Administered 2020-12-13: 50 mL

## 2020-12-13 MED ORDER — NITROGLYCERIN 1 MG/10 ML FOR IR/CATH LAB
INTRA_ARTERIAL | Status: DC | PRN
  Administered 2020-12-13 (×3): 300 ug

## 2020-12-13 MED ORDER — LABETALOL HCL 5 MG/ML IV SOLN
10.0000 mg | INTRAVENOUS | Status: DC | PRN
Start: 2020-12-13 — End: 2020-12-15
  Filled 2020-12-13: qty 4

## 2020-12-13 MED ORDER — SODIUM CHLORIDE 0.9% FLUSH: 3.0000 mL | INTRAVENOUS | Status: DC | PRN

## 2020-12-13 MED ORDER — ASPIRIN EC 81 MG PO TBEC
81.0000 mg | DELAYED_RELEASE_TABLET | Freq: Every day | ORAL | Status: DC
  Administered 2020-12-13 – 2020-12-15 (×2): 81 mg via ORAL
  Filled 2020-12-13 (×2): qty 1

## 2020-12-13 MED ORDER — FENTANYL CITRATE (PF) 100 MCG/2ML IJ SOLN
INTRAMUSCULAR | Status: DC | PRN
  Administered 2020-12-13: 50 ug via INTRAVENOUS
  Administered 2020-12-13: 25 ug via INTRAVENOUS

## 2020-12-13 MED ORDER — ROSUVASTATIN CALCIUM 5 MG PO TABS
10.0000 mg | ORAL_TABLET | Freq: Every day | ORAL | Status: DC
  Administered 2020-12-13 – 2020-12-15 (×2): 10 mg via ORAL
  Filled 2020-12-13 (×2): qty 2

## 2020-12-13 MED ORDER — SODIUM CHLORIDE 0.9 % IV SOLN: INTRAVENOUS | Status: DC

## 2020-12-13 MED ORDER — HEPARIN (PORCINE) IN NACL 1000-0.9 UT/500ML-% IV SOLN
INTRAVENOUS | Status: AC
  Filled 2020-12-13: qty 1000

## 2020-12-13 MED ORDER — SODIUM CHLORIDE 0.9 % IV SOLN: 250.0000 mL | INTRAVENOUS | Status: DC | PRN

## 2020-12-13 SURGICAL SUPPLY — 24 items
BAG SNAP BAND KOVER 36X36 (MISCELLANEOUS) ×1 IMPLANT
BALLN STERLING OTW 3X100X150 (BALLOONS) ×2
BALLOON STERLING OTW 3X100X150 (BALLOONS) IMPLANT
CATH OMNI FLUSH 5F 65CM (CATHETERS) ×1 IMPLANT
CATH SOFT-VU ST 4F 90CM (CATHETERS) ×1 IMPLANT
COVER DOME SNAP 22 D (MISCELLANEOUS) ×1 IMPLANT
DCB RANGER 4.0X40 135 (BALLOONS) IMPLANT
DEVICE CONTINUOUS FLUSH (MISCELLANEOUS) ×1 IMPLANT
FILTER CO2 0.2 MICRON (VASCULAR PRODUCTS) ×1 IMPLANT
KIT ENCORE 26 ADVANTAGE (KITS) ×1 IMPLANT
KIT MICROPUNCTURE NIT STIFF (SHEATH) ×1 IMPLANT
KIT PV (KITS) ×2 IMPLANT
RANGER DCB 4.0X40 135 (BALLOONS) ×2
RESERVOIR CO2 (VASCULAR PRODUCTS) ×1 IMPLANT
SET FLUSH CO2 (MISCELLANEOUS) ×1 IMPLANT
SHEATH FLEX ANSEL ANG 6F 45CM (SHEATH) ×1 IMPLANT
SHEATH PINNACLE 5F 10CM (SHEATH) ×1 IMPLANT
SHEATH PROBE COVER 6X72 (BAG) ×1 IMPLANT
SYR MEDRAD MARK V 150ML (SYRINGE) ×1 IMPLANT
TRANSDUCER W/STOPCOCK (MISCELLANEOUS) ×2 IMPLANT
TRAY PV CATH (CUSTOM PROCEDURE TRAY) ×2 IMPLANT
WIRE BENTSON .035X145CM (WIRE) ×1 IMPLANT
WIRE G V18X300CM (WIRE) ×1 IMPLANT
WIRE SPARTACORE .014X300CM (WIRE) ×1 IMPLANT

## 2020-12-13 NOTE — Care Management Important Message (Signed)
Important Message  Patient Details  Name: [name] [name] MRN: [id number] Date of Birth: Aug 06, 1946   Medicare Important Message Given:  Yes     Orbie Pyo 12/13/2020, 3:21 PM

## 2020-12-13 NOTE — Progress Notes (Signed)
Site area: Right groin a 6 french long sheath was removed  Site Prior to Removal:  Level 0  Pressure Applied For 20 MINUTES    Bedrest Beginning at 1720p  Manual:   Yes.    Patient Status During Pull:  stable  Post Pull Groin Site:  Level 0  Post Pull Instructions Given:  Yes.    Post Pull Pulses Present:  Yes.    Dressing Applied:  Yes.    Comments:

## 2020-12-13 NOTE — Progress Notes (Signed)
PT Cancellation Note  Patient Details Name: DARCY BARBARA MRN: [id number] DOB: 06/08/1946   Cancelled Treatment:    Reason Eval/Treat Not Completed: Other (comment)   Noted plan for aortogram today;  Will continue to follow,   Roney Marion, PT  Acute Rehabilitation Services Pager [phone] Office [phone]    Colletta [place] 12/13/2020, 8:50 AM

## 2020-12-13 NOTE — Op Note (Signed)
Patient name: [name] [name] MRN: [id number] DOB: 04-Jul-1946 Sex: female  12/13/2020 Pre-operative Diagnosis: Toe ulcer Post-operative diagnosis:  Same Surgeon:  Annamarie Major Procedure Performed:  1.  Ultrasound-guided access, left femoral artery  2.  Abdominal aortogram with CO2  3.  Bilateral lower extremity runoff  4.  Drug-coated balloon angioplasty, right superficial femoral artery  5.  Angioplasty, right anterior tibial artery  6.  Angioplasty, right posterior tibial artery  7.  Intra-arterial administration of nitroglycerin  8.  Conscious sedation, 114 minutes   Indications: This is a 74 year old female with a right 5th toe ulcer.  She comes in today for arterial evaluation.  Procedure:  The patient was identified in the holding area and taken to room 8.  The patient was then placed supine on the table and prepped and draped in the usual sterile fashion.  A time out was called.  Conscious sedation was administered with the use of IV fentanyl and Versed under continuous physician and nurse monitoring.  Heart rate, blood pressure, and oxygen saturation were continuously monitored.  Total sedation time was 114 minutes.  Ultrasound was used to evaluate the left common femoral artery.  It was patent .  A digital ultrasound image was acquired.  A micropuncture needle was used to access the left common femoral artery under ultrasound guidance.  An 018 wire was advanced without resistance and a micropuncture sheath was placed.  The 018 wire was removed and a benson wire was placed.  The micropuncture sheath was exchanged for a 5 french sheath.  An omniflush catheter was advanced over the wire to the level of L-1.  An abdominal angiogram with CO2 was obtained.  Next, the omniflush catheter was pulled out of the aortic bifurcation and bilateral runoff with CO2 was obtained and till the images were suboptimal.  I then used a Omni Flush catheter across the aortic bifurcation, placed the catheter  and the common femoral artery and obtain contrasted images of the right leg.  Findings:   Aortogram: No visualized renal artery stenosis is identified.  The infrarenal abdominal aorta is patent without stenosis.  The bilateral common iliac arteries were patent without significant stenosis.  Right Lower Extremity: The right common femoral and profundofemoral artery were patent without stenosis.  The superficial femoral artery was patent there was a 70% stenosis which was focal in the proximal portion of the artery, with length about 1 cm.  The remaining portion of the superficial femoral and popliteal artery without stenosis.  The dominant runoff vessel appeared to be the posterior tibial.  The peroneal artery appeared to be patent down to the ankle.  Anterior tibial appeared to be occluded at the ankle.  Left Lower Extremity: The left common femoral and profundofemoral artery were patent.  There was a mid superficial femoral artery stenosis.  Runoff was not able to be obtained secondary to patient movement and use of CO2  Intervention: At this point, the decision was made to proceed with intervention.  A 6 French 45 cm sheath was advanced over the aortic bifurcation and placed into the right common femoral artery.  The patient was fully heparinized.  ACT measurements were 270.  I then used a 014 [place] core wire and easily advanced this across the lesion in the superficial femoral artery.  I elected to treat this with primary drug-coated balloon angioplasty given the short segment and the small caliber vessel.  The balloon was taken to 4 atm for 3 minutes.  Completion  imaging showed resolution of the stenosis.  Additional imaging of the leg showed either spasm or embolic occlusion of the posterior tibial and possibly the anterior tibial artery.  At this point I I placed a V 18 wire and was able to easily navigate this into the posterior tibial artery across the ankle.  I then performed balloon angioplasty  of the posterior tibial artery using a 3 x 100 [place] balloon, taking each inflation to 4 atm for 2 minutes.  Once balloon inflation was completed, intra-arterial administration of 300 mcg of nitroglycerin was given.  Completion imaging was performed which showed widely patent posterior tibial artery across ankle.  I then elected to select the anterior tibial artery and was able to get a wire across the ankle however it did not go into the plantar arch.  I did perform balloon angioplasty of the anterior tibial artery with a 3 mm balloon for 2 minutes.  Intra-arterial administration of 300 mcg nitroglycerin was given.  This showed inline flow of the anterior tibial artery down to the ankle however it was occluded distal to this.  I then performed a final injection of the leg with a catheter in the popliteal artery, and this showed that the posterior tibial artery had reoccluded.  This was then selected with the balloon catheter and wire and better imaging was obtained that showed a possible dissection from prior angioplasty.  I then reinserted the 3 mm balloon and perform balloon angioplasty at a low atmospheric pressure for 3 minutes of this area.  Completion imaging showed the vessel to be widely patent with resolution of the stenosis.  Catheters and wires were removed.  Patient taken the holding area for sheath pull once her coagulation profile corrects  Impression:  #1  A total of 45 cc of contrast was utilized for the procedure  #2  Focal right superficial femoral artery stenosis, 70% was treated with drug-coated balloon angioplasty with residual lesion less than 10%.  #3  Upon completion of the intervention, runoff through the posterior tibial artery appeared to be suboptimal either through embolization or spasm.  I therefore proceeded to perform balloon angioplasty of the posterior tibial artery as well as anterior tibial artery.  After intervention these vessels were widely patent.    Theotis Burrow, M.D., [facility] [place] Office: [phone] Pager:  [phone]

## 2020-12-13 NOTE — Interval H&P Note (Signed)
History and Physical Interval Note:  12/13/2020 11:56 AM  [name] [name]  has presented today for surgery, with the diagnosis of peripheral vascular disease.  The various methods of treatment have been discussed with the patient and family. After consideration of risks, benefits and other options for treatment, the patient has consented to  Procedure(s): ABDOMINAL AORTOGRAM W/LOWER EXTREMITY (N/A) as a surgical intervention.  The patient's history has been reviewed, patient examined, no change in status, stable for surgery.  I have reviewed the patient's chart and labs.  Questions were answered to the patient's satisfaction.     Annamarie Major

## 2020-12-13 NOTE — Progress Notes (Signed)
PROGRESS NOTE    DECLYN OFFIELD  BHA:[id number] DOB: [birth year] DOA: 12/05/2020 PCP: Andres Shad, MD    Brief Narrative:  [name] [name] admitted to the hospital working diagnosis of acute symptomatic anemia due to upper GI bleed. 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  74 year old female with past medical history for paroxysmal atrial fibrillation, hypertension, dyslipidemia, diastolic heart failure, and GERD. Patient developed a syncope episode, preceded by fatigue for several days.On her initial physical examination temperature 98.8, heart rate 79, respiratory rate 15, blood pressure 140/66, her lungs were clear to auscultation bilaterally, heart S1-S2, present rhythmic, her abdomen was soft, no lower extremity edema.  Sodium 129, potassium 3.6, chloride 92, bicarb 27, glucose 119, BUN 44, creatinine 3.89, white count 6.7, hemoglobin 7.0, hematocrit 23.3, platelets 310. SARS COVID-19 negative. Chest radiograph with mild cardiomegaly, no infiltrates. EKG 71 bpm, normal axis, normal intervals, sinus with sinus arrhythmia, no ST segment or T wave changes.  Patient received#1 PRBCcell transfusion, placed on intravenous pantoprazole and octreotide. Apixaban was held. 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   Further work up with foot MRIshowed nonspecific findings, concerning for possible osteomyelitis. ABI were consistent with mild to moderate peripheral vascular disease. General surgery consulted, [facility].  Patient received second unit PRBC 12/17 with good toleration.   Vascular surgery will perform arteriogram 12/21 and possible toe amputation the following day.     Assessment & Plan:   Principal Problem:   GI bleed Active Problems:   Rectal bleeding    Paroxysmal atrial fibrillation (HCC)   Hyponatremia   Upper GI bleed   Dry gangrene ([place])   1. 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  Sp2units PRBC Iron stores with serum iron 11, TIBC 342, Transferrin saturation 3 and ferritin 71. sp ferrous gluconate IV x2 doses   Recent EGD 12/5 On sq octreotide per GI recommendations. No further endo work up  2. 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 MRI with 5th distal phalanx changes suggestive of osteomyelitis  Continue current antibiotic therapy withIV vancomycin, plan for aortogram on 12/13/20--per surgical procedure findings decision will be made in regards of stoping antibiotic therapy.    Pain control with gabapentin (increase to her home dose of 300 mg po tid) and hydrocodone/ acetaminophen for break through pain control.  3. Paroxysmal atrial fibrillation.On flecainide for rhythm control. Currently not on anticoagulation due to GI bleeding.   4. 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 Stable renal function with serum cr at 2.73,  potassium corrected continue to hold on furosemide.  Follow up renal function in am, patient will receive IV contrast for angiography. Avoid hypotension and nephrotoxic medications.   5. Anxiety.On PRNlorazepam with good toleration.    6. COPD.On dulera and as needed albuterol, no acute exacerbation   7. Mild protein calorie malnutrition.Continue with nutrition recommendations.  8. HTN. blood pressure this am continue to be well controlled, 1153/67 mmHg.    Patient continue to be at high risk for worsening foot gangrene.   Status is: Inpatient  Remains inpatient appropriate because:IV treatments appropriate due to intensity of illness or inability to take PO   Dispo: The patient is from: Home  Anticipated d/c is to: Home              Anticipated d/c date is: 3 days              Patient currently is not medically stable to d/c.   DVT prophylaxis: scd   Code Status:    full  Family Communication:  No family at the bedside      Nutrition Status: Nutrition Problem: Increased nutrient needs Etiology: wound healing Signs/Symptoms: estimated needs Interventions: Ensure Enlive (each supplement provides 350kcal and 20 grams of protein),Prostat     Consultants:   GI   Surgery   Vascular surgery   Antimicrobials:   IV vancomycin     Subjective: Awake coherent no distress understands she is going for angiography Not in severe pain  Objective: Vitals:   12/12/20 1640 12/12/20 1946/04/27 12/12/20 2310 12/13/20 0449  BP: (!) 153/68  (!) 165/75 140/66  Pulse: 61  61 (!) 56  Resp: 16  17 18   Temp: 99.4 F (37.4 C)  98 F (36.7 C) 98.5 F (36.9 C)  TempSrc: Oral     SpO2: 100% 97% 100% 99%  Weight:        Intake/Output Summary (Last 24 hours) at 12/13/2020 1050 Last data filed at 12/13/2020 1000 Gross per 24 hour  Intake 240 ml  Output --  Net 240 ml   Filed Weights   12/10/20 0500 12/11/20 0426 12/12/20 0438  Weight: 74.7 kg 74.7 kg 74.1 kg    Examination:   EOMI NCAT no focal deficit Chest clear S1-S2 no murmur Abdomen soft Neurologically intact Skin exam       Data Reviewed: I have personally reviewed following labs and imaging studies  CBC: Recent Labs  Lab 12/07/20 0416 12/08/20 0542 12/09/20 0020 12/10/20 0414 12/12/20 1242  WBC 6.9 7.0 6.2 5.8 7.3  NEUTROABS  --   --   --  2.7 4.0  HGB 8.3* 7.5* 7.6* 9.0* 9.5*  HCT 27.1* 25.0* 24.7* 27.7* 30.8*  MCV 90.0 90.3 87.6 85.5 86.8  PLT 315 282 301 335 937*   Basic Metabolic Panel: Recent Labs  Lab 12/07/20 0416 12/08/20 0542 12/09/20 0020 12/10/20 0414 12/11/20 0157 12/12/20 0039 12/13/20 0126  NA 132* 132* 135 137 138 138 136  K 3.6 3.7 3.1* 3.6 3.1* 3.2* 3.6  CL 95* 98  97* 97* 97* 97* 98  CO2 27 25 28 26 27 27 27   GLUCOSE 122* 125* 114* 96 108* 109* 106*  BUN 38* 28* 23 18 18 23  30*  CREATININE 3.46* 2.89* 2.81* 2.81* 2.84* 2.72* 2.73*  CALCIUM 8.6* 8.5* 8.6* 8.8* 8.8* 8.8* 8.8*  MG  --  2.2  --   --   --   --   --   PHOS 4.2 2.9 3.3  --   --   --   --    GFR: Estimated Creatinine Clearance: 17 mL/min (A) (by C-G formula based on SCr of 2.73 mg/dL (H)). Liver Function Tests: Recent Labs  Lab 12/07/20 0416 12/08/20 0542 12/09/20 0020  ALBUMIN 3.5 3.2* 2.8*   No results for input(s): LIPASE, AMYLASE in the last 168 hours. No results for input(s): AMMONIA in the last 168 hours. Coagulation Profile: No results for input(s): INR, PROTIME in the last 168 hours. Cardiac Enzymes: No results for input(s): CKTOTAL, CKMB, CKMBINDEX, TROPONINI in the last 168 hours. BNP (last 3 results) No results for input(s): PROBNP in the last 8760 hours. HbA1C: No results for  input(s): HGBA1C in the last 72 hours. CBG: No results for input(s): GLUCAP in the last 168 hours. Lipid Profile: No results for input(s): CHOL, HDL, LDLCALC, TRIG, CHOLHDL, LDLDIRECT in the last 72 hours. Thyroid Function Tests: No results for input(s): TSH, T4TOTAL, FREET4, T3FREE, THYROIDAB in the last 72 hours. Anemia Panel: No results for input(s): VITAMINB12, FOLATE, FERRITIN, TIBC, IRON, RETICCTPCT in the last 72 hours.    Radiology Studies: I have reviewed all of the imaging during this hospital visit personally     Scheduled Meds: . (feeding supplement) PROSource Plus  30 mL Oral BID BM  . sodium chloride   Intravenous Once  . feeding supplement  237 mL Oral BID BM  . flecainide  50 mg Oral BID  . lubiprostone  24 mcg Oral BID WC  . mometasone-formoterol  2 puff Inhalation BID  . octreotide  100 mcg Subcutaneous Q12H  . pantoprazole  40 mg Oral BID AC   Continuous Infusions: . sodium chloride 100 mL/hr at 12/13/20 1016  . vancomycin 1,000 mg (12/11/20 1657)      LOS: 8 days        Nita Sells, MD

## 2020-12-14 ENCOUNTER — Encounter (HOSPITAL_COMMUNITY)
Admission: EM | Disposition: A | Discharge status: Home / Self Care | Payer: Self-pay | Source: Home / Self Care | Attending: Internal Medicine

## 2020-12-14 ENCOUNTER — Inpatient Hospital Stay (HOSPITAL_COMMUNITY): Payer: Medicare HMO | Admitting: Certified Registered Nurse Anesthetist

## 2020-12-14 ENCOUNTER — Encounter (HOSPITAL_COMMUNITY): Payer: Self-pay | Admitting: Surgery

## 2020-12-14 HISTORY — PX: AMPUTATION: SHX166

## 2020-12-14 LAB — RENAL FUNCTION PANEL
Albumin: 2.7 g/dL — ABNORMAL LOW (ref 3.5–5.0)
Anion gap: 11 (ref 5–15)
BUN: 28 mg/dL — ABNORMAL HIGH (ref 8–23)
CO2: 25 mmol/L (ref 22–32)
Calcium: 8.4 mg/dL — ABNORMAL LOW (ref 8.9–10.3)
Chloride: 101 mmol/L (ref 98–111)
Creatinine, Ser: 2.64 mg/dL — ABNORMAL HIGH (ref 0.44–1.00)
GFR, Estimated: 18 mL/min — ABNORMAL LOW (ref >60)
Glucose, Bld: 134 mg/dL — ABNORMAL HIGH (ref 70–99)
Phosphorus: 3.4 mg/dL (ref 2.5–4.6)
Potassium: 3.6 mmol/L (ref 3.5–5.1)
Sodium: 137 mmol/L (ref 135–145)

## 2020-12-14 LAB — CBC WITH DIFFERENTIAL/PLATELET
Abs Immature Granulocytes: 0.03 10*3/uL (ref 0.00–0.07)
Basophils Absolute: 0 10*3/uL (ref 0.0–0.1)
Basophils Relative: 0 %
Eosinophils Absolute: 0.5 10*3/uL (ref 0.0–0.5)
Eosinophils Relative: 5 %
HCT: 29.7 % — ABNORMAL LOW (ref 36.0–46.0)
Hemoglobin: 9.1 g/dL — ABNORMAL LOW (ref 12.0–15.0)
Immature Granulocytes: 0 %
Lymphocytes Relative: 18 %
Lymphs Abs: 1.5 10*3/uL (ref 0.7–4.0)
MCH: 27 pg (ref 26.0–34.0)
MCHC: 30.6 g/dL (ref 30.0–36.0)
MCV: 88.1 fL (ref 80.0–100.0)
Monocytes Absolute: 0.7 10*3/uL (ref 0.1–1.0)
Monocytes Relative: 9 %
Neutro Abs: 5.8 10*3/uL (ref 1.7–7.7)
Neutrophils Relative %: 68 %
Platelets: 363 10*3/uL (ref 150–400)
RBC: 3.37 MIL/uL — ABNORMAL LOW (ref 3.87–5.11)
RDW: 16.2 % — ABNORMAL HIGH (ref 11.5–15.5)
WBC: 8.6 10*3/uL (ref 4.0–10.5)
nRBC: 0 % (ref 0.0–0.2)

## 2020-12-14 SURGERY — AMPUTATION DIGIT
Anesthesia: General | Laterality: Right

## 2020-12-14 MED ORDER — ONDANSETRON HCL 4 MG/2ML IJ SOLN
INTRAMUSCULAR | Status: AC
  Filled 2020-12-14: qty 2

## 2020-12-14 MED ORDER — ONDANSETRON HCL 4 MG/2ML IJ SOLN
INTRAMUSCULAR | Status: DC | PRN
  Administered 2020-12-14: 4 mg via INTRAVENOUS

## 2020-12-14 MED ORDER — DEXAMETHASONE SODIUM PHOSPHATE 10 MG/ML IJ SOLN
INTRAMUSCULAR | Status: DC | PRN
  Administered 2020-12-14: 10 mg via INTRAVENOUS

## 2020-12-14 MED ORDER — CHLORHEXIDINE GLUCONATE 0.12 % MT SOLN
OROMUCOSAL | Status: AC
  Administered 2020-12-14: 15 mL via OROMUCOSAL
  Filled 2020-12-14: qty 15

## 2020-12-14 MED ORDER — PROPOFOL 10 MG/ML IV BOLUS
INTRAVENOUS | Status: DC | PRN
  Administered 2020-12-14: 100 mg via INTRAVENOUS

## 2020-12-14 MED ORDER — FENTANYL CITRATE (PF) 250 MCG/5ML IJ SOLN
INTRAMUSCULAR | Status: AC
  Filled 2020-12-14: qty 5

## 2020-12-14 MED ORDER — 0.9 % SODIUM CHLORIDE (POUR BTL) OPTIME
TOPICAL | Status: DC | PRN
  Administered 2020-12-14: 1000 mL

## 2020-12-14 MED ORDER — VANCOMYCIN HCL IN DEXTROSE 1-5 GM/200ML-% IV SOLN
1000.0000 mg | Freq: Once | INTRAVENOUS | Status: AC
  Administered 2020-12-14: 1000 mg via INTRAVENOUS

## 2020-12-14 MED ORDER — LIDOCAINE 2% (20 MG/ML) 5 ML SYRINGE
INTRAMUSCULAR | Status: DC | PRN
  Administered 2020-12-14: 40 mg via INTRAVENOUS

## 2020-12-14 MED ORDER — SODIUM CHLORIDE 0.9 % IV SOLN: INTRAVENOUS | Status: DC

## 2020-12-14 MED ORDER — SODIUM CHLORIDE 0.9 % IV SOLN: INTRAVENOUS | Status: DC | PRN

## 2020-12-14 MED ORDER — CHLORHEXIDINE GLUCONATE 0.12 % MT SOLN: 15.0000 mL | Freq: Once | OROMUCOSAL | Status: AC

## 2020-12-14 MED ORDER — VANCOMYCIN HCL IN DEXTROSE 1-5 GM/200ML-% IV SOLN
INTRAVENOUS | Status: AC
  Filled 2020-12-14: qty 200

## 2020-12-14 MED ORDER — FENTANYL CITRATE (PF) 100 MCG/2ML IJ SOLN
INTRAMUSCULAR | Status: AC
  Filled 2020-12-14: qty 2

## 2020-12-14 MED ORDER — FENTANYL CITRATE (PF) 250 MCG/5ML IJ SOLN
INTRAMUSCULAR | Status: DC | PRN
  Administered 2020-12-14 (×4): 25 ug via INTRAVENOUS

## 2020-12-14 MED ORDER — DEXAMETHASONE SODIUM PHOSPHATE 10 MG/ML IJ SOLN
INTRAMUSCULAR | Status: AC
  Filled 2020-12-14: qty 1

## 2020-12-14 MED ORDER — CEFAZOLIN SODIUM-DEXTROSE 2-4 GM/100ML-% IV SOLN
INTRAVENOUS | Status: AC
  Filled 2020-12-14: qty 100

## 2020-12-14 MED FILL — Lidocaine HCl Local Preservative Free (PF) Inj 1%: INTRAMUSCULAR | Qty: 30 | Status: AC

## 2020-12-14 SURGICAL SUPPLY — 31 items
BNDG GAUZE ELAST 4 BULKY (GAUZE/BANDAGES/DRESSINGS) ×2 IMPLANT
CANISTER SUCT 3000ML PPV (MISCELLANEOUS) ×2 IMPLANT
CLIP LIGATING EXTRA MED SLVR (CLIP) ×1 IMPLANT
CLIP LIGATING EXTRA SM BLUE (MISCELLANEOUS) ×1 IMPLANT
COVER SURGICAL LIGHT HANDLE (MISCELLANEOUS) ×4 IMPLANT
COVER WAND RF STERILE (DRAPES) ×2 IMPLANT
DRAPE EXTREMITY T 121X128X90 (DISPOSABLE) ×2 IMPLANT
ELECT REM PT RETURN 9FT ADLT (ELECTROSURGICAL) ×2
ELECTRODE REM PT RTRN 9FT ADLT (ELECTROSURGICAL) ×1 IMPLANT
GAUZE SPONGE 4X4 12PLY STRL (GAUZE/BANDAGES/DRESSINGS) ×2 IMPLANT
GAUZE SPONGE 4X4 12PLY STRL LF (GAUZE/BANDAGES/DRESSINGS) ×1 IMPLANT
GLOVE BIO SURGEON STRL SZ 6.5 (GLOVE) ×2 IMPLANT
GLOVE BIO SURGEON STRL SZ7 (GLOVE) ×1 IMPLANT
GLOVE SS BIOGEL STRL SZ 7.5 (GLOVE) ×1 IMPLANT
GLOVE SUPERSENSE BIOGEL SZ 7.5 (GLOVE) ×1
GOWN STRL REUS W/ TWL LRG LVL3 (GOWN DISPOSABLE) ×3 IMPLANT
GOWN STRL REUS W/TWL LRG LVL3 (GOWN DISPOSABLE) ×6
KIT BASIN OR (CUSTOM PROCEDURE TRAY) ×2 IMPLANT
KIT TURNOVER KIT B (KITS) ×2 IMPLANT
NEEDLE 22X1 1/2 (OR ONLY) (NEEDLE) IMPLANT
NS IRRIG 1000ML POUR BTL (IV SOLUTION) ×2 IMPLANT
PACK GENERAL/GYN (CUSTOM PROCEDURE TRAY) ×2 IMPLANT
PAD ARMBOARD 7.5X6 YLW CONV (MISCELLANEOUS) ×4 IMPLANT
SUT ETHILON 3 0 PS 1 (SUTURE) ×3 IMPLANT
SUT VIC AB 3-0 SH 27 (SUTURE) ×2
SUT VIC AB 3-0 SH 27X BRD (SUTURE) ×1 IMPLANT
SYR CONTROL 10ML LL (SYRINGE) IMPLANT
TAPE CLOTH SURG 4X10 WHT LF (GAUZE/BANDAGES/DRESSINGS) ×1 IMPLANT
TOWEL GREEN STERILE (TOWEL DISPOSABLE) ×4 IMPLANT
UNDERPAD 30X36 HEAVY ABSORB (UNDERPADS AND DIAPERS) ×2 IMPLANT
WATER STERILE IRR 1000ML POUR (IV SOLUTION) ×2 IMPLANT

## 2020-12-14 NOTE — Interval H&P Note (Signed)
History and Physical Interval Note:  12/14/2020 10:18 AM  [name] [name]  has presented today for surgery, with the diagnosis of Dry Gangrene.  The various methods of treatment have been discussed with the patient and family. After consideration of risks, benefits and other options for treatment, the patient has consented to  Procedure(s): Right fifth toe amputation (Right) as a surgical intervention.  The patient's history has been reviewed, patient examined, no change in status, stable for surgery.  I have reviewed the patient's chart and labs.  Questions were answered to the patient's satisfaction.     Curt Jews

## 2020-12-14 NOTE — Anesthesia Procedure Notes (Signed)
Procedure Name: LMA Insertion Date/Time: 12/14/2020 10:49 AM Performed by: Harden Mo, CRNA Pre-anesthesia Checklist: Patient identified, Emergency Drugs available, Suction available and Patient being monitored Patient Re-evaluated:Patient Re-evaluated prior to induction Oxygen Delivery Method: Circle System Utilized Preoxygenation: Pre-oxygenation with 100% oxygen Induction Type: IV induction LMA: LMA inserted LMA Size: 4.0 Number of attempts: 1 Airway Equipment and Method: Bite block Placement Confirmation: positive ETCO2 Tube secured with: Tape Dental Injury: Teeth and Oropharynx as per pre-operative assessment

## 2020-12-14 NOTE — H&P (View-Only) (Signed)
  Progress Note    12/14/2020 8:56 AM Day of Surgery  Subjective:  No complaints. States she is feeling good. Continues to have pain in right 5th toe   Vitals:   12/14/20 0420 12/14/20 0826  BP: (!) 151/75 (!) 163/66  Pulse: 62 60  Resp: 14 11  Temp: 98.7 F (37.1 C) 98 F (36.7 C)  SpO2: 98%    Physical Exam: Cardiac:  regular Lungs: non labored Extremities: left common femoral access site clean, dry and intact without any bleeding or hematoma Neurologic: alert and oriented  CBC    Component Value Date/Time   WBC 8.6 12/14/2020 0148   RBC 3.37 (L) 12/14/2020 0148   HGB 9.1 (L) 12/14/2020 0148   HCT 29.7 (L) 12/14/2020 0148   PLT 363 12/14/2020 0148   MCV 88.1 12/14/2020 0148   MCH 27.0 12/14/2020 0148   MCHC 30.6 12/14/2020 0148   RDW 16.2 (H) 12/14/2020 0148   LYMPHSABS 1.5 12/14/2020 0148   MONOABS 0.7 12/14/2020 0148   EOSABS 0.5 12/14/2020 0148   BASOSABS 0.0 12/14/2020 0148    BMET    Component Value Date/Time   NA 137 12/14/2020 0148   K 3.6 12/14/2020 0148   CL 101 12/14/2020 0148   CO2 25 12/14/2020 0148   GLUCOSE 134 (H) 12/14/2020 0148   BUN 28 (H) 12/14/2020 0148   CREATININE 2.64 (H) 12/14/2020 0148   CREATININE 1.17 (H) 05/06/2012 1000   CALCIUM 8.4 (L) 12/14/2020 0148   GFRNONAA 18 (L) 12/14/2020 0148   GFRAA 20 (L) 09/11/2020 0519    INR    Component Value Date/Time   INR 1.5 (H) 12/06/2020 0354     Intake/Output Summary (Last 24 hours) at 12/14/2020 0856 Last data filed at 12/14/2020 0200 Gross per 24 hour  Intake 1310.09 ml  Output --  Net 1310.09 ml     Assessment/Plan:  74 y.o. female is s/p   1.  Ultrasound-guided access, left femoral artery             2.  Abdominal aortogram with CO2             3.  Bilateral lower extremity runoff             4.  Drug-coated balloon angioplasty, right superficial femoral artery             5.  Angioplasty, right anterior tibial artery             6.  Angioplasty, right  posterior tibial artery             7.  Intra-arterial administration of nitroglycerin post Op Day 1 Lower extremities well perfused and warm. Brisk Doppler PT/ AT signals in right foot. Left femoral access site clean, dry and intact without hematoma. She is on schedule today to go to OR for right 5th toe amputation   [name] [phone] 12/14/2020 8:56 AM

## 2020-12-14 NOTE — Anesthesia Postprocedure Evaluation (Signed)
Anesthesia Post Note  Patient: [name] [name]  Procedure(s) Performed: Right fifth toe amputation (Right )     Patient location during evaluation: PACU Anesthesia Type: General Level of consciousness: awake and alert Pain management: pain level controlled Vital Signs Assessment: post-procedure vital signs reviewed and stable Respiratory status: spontaneous breathing, nonlabored ventilation, respiratory function stable and patient connected to nasal cannula oxygen Cardiovascular status: blood pressure returned to baseline and stable Postop Assessment: no apparent nausea or vomiting Anesthetic complications: no   No complications documented.  Last Vitals:  Vitals:   12/14/20 1140 12/14/20 1204  BP: (!) 173/66   Pulse: 69   Resp: 20   Temp: 36.7 C 36.7 C  SpO2: 96%     Last Pain:  Vitals:   12/14/20 1204  TempSrc: Oral  PainSc: 0-No pain                 Fable Huisman L Enyla [place]

## 2020-12-14 NOTE — Progress Notes (Signed)
PROGRESS NOTE    [name] [name]  TIR:[id number] DOB: 08/13/1946 DOA: 12/05/2020 PCP: Andres Shad, MD   Brief Narrative:  74 year old female with past medical history for paroxysmal atrial fibrillation, hypertension, dyslipidemia, diastolic heart failure, and GERD. Patient developed a syncope episode, preceded by fatigue for several days.On her initial physical examination temperature 98.8, heart rate 79, respiratory rate 15, blood pressure 140/66, her lungs were clear to auscultation bilaterally, heart S1-S2, present rhythmic, her abdomen was soft, no lower extremity edema.  Sodium 129, potassium 3.6, chloride 92, bicarb 27, glucose 119, BUN 44, creatinine 3.89, white count 6.7, hemoglobin 7.0, hematocrit 23.3, platelets 310. SARS COVID-19 negative. Chest radiograph with mild cardiomegaly, no infiltrates. EKG 71 bpm, normal axis, normal intervals, sinus with sinus arrhythmia, no ST segment or T wave changes.  Patient received#1 PRBCcell transfusion, placed on intravenous pantoprazole and octreotide. Apixaban was held. 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   Further work up with foot MRIshowed nonspecific findings, concerning for possible osteomyelitis. ABI were consistent with mild to moderate peripheral vascular disease. General surgery consulted, recommendations to [facility].  Patient received second unit PRBC 12/17 with good toleration.   Vascular surgery will perform arteriogram 12/21 and possible toe amputation the following day.   Assessment & Plan: Acute symptomatic anemia due to acute blood loss due to upper GI bleed(small bowel angiodysplasia)/ combined anemia of chronic disease and iron deficiency.  -Status post 2 unit blood transfusion. -Iron stores with serum iron 11, TIBC 342, Transferrin saturation 3 and ferritin 71. -sp ferrous  gluconate IV x2 doses  0Recent EGD 12/5 -Onsq octreotide per GI recommendations-No further endo work up.  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 -Continue pain medicines as needed.  Paroxysmal atrial fibrillation.On flecainide for rhythm control.Currently not on anticoagulation due to GI bleeding.  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 -Stable renal function with serum cr at 2.73, -Continue to monitor kidney function closely.  Continue to hold nephrotoxic medication.  Anxiety.On PRNlorazepam with good toleration.    COPD.Ondulera and as needed albuterol, no acute exacerbation   Mild protein calorie malnutrition.Continue with nutrition recommendations.   HTN: Well-controlled.  Continue hydralazine and labetalol as needed for blood pressure more than 160/100.  Hyperlipidemia: Continue statin  DVT prophylaxis: SCD Code Status: Full code Family Communication:  None present at bedside.  Plan of care discussed with patient in length and she verbalized understanding and agreed with it. Disposition Plan: Likely home tomorrow Consultants:  Vascular surgery Procedures:  Right fifth toe amputation by vascular surgery Antimicrobials:   Vancomycin  Status is: Inpatient  Remains inpatient appropriate because:Ongoing diagnostic testing needed not appropriate for outpatient work up   Dispo: The patient is from: Home              Anticipated d/c is to: Home              Anticipated d/c date is: 1 day              Patient currently is not medically stable to d/c.      Subjective: Patient seen and examined after the surgery.  She tells me that she is doing pretty well.  She is able to walk without any issues.  Tells me  that she would like to go home  tomorrow.  Objective: Vitals:   12/14/20 0850 12/14/20 1126 12/14/20 1140 12/14/20 1204  BP:  (!) 166/67 (!) 173/66   Pulse:  70 69 71  Resp:  11 20 19   Temp:  98.1 F (36.7 C) 98.1 F (36.7 C) 98.1 F (36.7 C)  TempSrc:    Oral  SpO2:  99% 96%   Weight:      Height: 5' 2.01" (1.575 m)       Intake/Output Summary (Last 24 hours) at 12/14/2020 1604 Last data filed at 12/14/2020 1300 Gross per 24 hour  Intake 1750.09 ml  Output 10 ml  Net 1740.09 ml   Filed Weights   12/11/20 0426 12/12/20 0438 12/14/20 0610  Weight: 74.7 kg 74.1 kg 70 kg    Examination:  General exam: Appears calm and comfortable, on room air, communicating well Respiratory system: Clear to auscultation. Respiratory effort normal. Cardiovascular system: S1 & S2 heard, RRR. No JVD, murmurs, rubs, gallops or clicks. No pedal edema. Gastrointestinal system: Abdomen is nondistended, soft and nontender. No organomegaly or masses felt. Normal bowel sounds heard. Central nervous system: Alert and oriented. No focal neurological deficits. Extremities: Symmetric 5 x 5 power. Skin: No rashes, lesions or ulcers.  Right foot: Dressing dry and intact noted. Psychiatry: Judgement and insight appear normal. Mood & affect appropriate.    Data Reviewed: I have personally reviewed following labs and imaging studies  CBC: Recent Labs  Lab 12/09/20 0020 12/10/20 0414 12/12/20 1242 12/13/20 0507 12/14/20 0148  WBC 6.2 5.8 7.3 6.6 8.6  NEUTROABS  --  2.7 4.0  --  5.8  HGB 7.6* 9.0* 9.5* 9.0* 9.1*  HCT 24.7* 27.7* 30.8* 29.8* 29.7*  MCV 87.6 85.5 86.8 89.2 88.1  PLT 301 335 421* 407* 841   Basic Metabolic Panel: Recent Labs  Lab 12/08/20 0542 12/09/20 0020 12/10/20 0414 12/11/20 0157 12/12/20 0039 12/13/20 0126 12/14/20 0148  NA 132* 135 137 138 138 136 137  K 3.7 3.1* 3.6 3.1* 3.2* 3.6 3.6  CL 98 97* 97* 97* 97* 98 101  CO2 25 28 26 27 27 27 25   GLUCOSE 125* 114* 96 108* 109* 106* 134*  BUN 28* 23  18 18 23  30* 28*  CREATININE 2.89* 2.81* 2.81* 2.84* 2.72* 2.73* 2.64*  CALCIUM 8.5* 8.6* 8.8* 8.8* 8.8* 8.8* 8.4*  MG 2.2  --   --   --   --   --   --   PHOS 2.9 3.3  --   --   --   --  3.4   GFR: Estimated Creatinine Clearance: 17.1 mL/min (A) (by C-G formula based on SCr of 2.64 mg/dL (H)). Liver Function Tests: Recent Labs  Lab 12/08/20 0542 12/09/20 0020 12/14/20 0148  ALBUMIN 3.2* 2.8* 2.7*   No results for input(s): LIPASE, AMYLASE in the last 168 hours. No results for input(s): AMMONIA in the last 168 hours. Coagulation Profile: No results for input(s): INR, PROTIME in the last 168 hours. Cardiac Enzymes: No results for input(s): CKTOTAL, CKMB, CKMBINDEX, TROPONINI in the last 168 hours. BNP (last 3 results) No results for input(s): PROBNP in the last 8760 hours. HbA1C: No results for input(s): HGBA1C in the last 72 hours. CBG: No results for input(s): GLUCAP in the last 168 hours. Lipid Profile: No results for input(s): CHOL, HDL, LDLCALC, TRIG, CHOLHDL, LDLDIRECT in the last 72 hours. Thyroid Function Tests: No results for input(s): TSH, T4TOTAL, FREET4, T3FREE, THYROIDAB in the  last 72 hours. Anemia Panel: No results for input(s): VITAMINB12, FOLATE, FERRITIN, TIBC, IRON, RETICCTPCT in the last 72 hours. Sepsis Labs: No results for input(s): PROCALCITON, LATICACIDVEN in the last 168 hours.  Recent Results (from the past 240 hour(s))  Resp Panel by RT-PCR (Flu A&B, Covid) Nasopharyngeal Swab     Status: None   Collection Time: 12/05/20  8:11 PM   Specimen: Nasopharyngeal Swab; Nasopharyngeal(NP) swabs in vial transport medium  Result Value Ref Range Status   SARS Coronavirus 2 by RT PCR NEGATIVE NEGATIVE Final    Comment: (NOTE) SARS-CoV-2 target nucleic acids are NOT DETECTED.  The SARS-CoV-2 RNA is generally detectable in upper respiratory specimens during the acute phase of infection. The lowest concentration of SARS-CoV-2 viral copies this assay can  detect is 138 copies/mL. A negative result does not preclude SARS-Cov-2 infection and should not be used as the sole basis for treatment or other patient management decisions. A negative result may occur with  improper specimen collection/handling, submission of specimen other than nasopharyngeal swab, presence of viral mutation(s) within the areas targeted by this assay, and inadequate number of viral copies(<138 copies/mL). A negative result must be combined with clinical observations, patient history, and epidemiological information. The expected result is Negative.  Fact Sheet for Patients:  EntrepreneurPulse.com.au  Fact Sheet for Healthcare Providers:  IncredibleEmployment.be  This test is no t yet approved or cleared by the [place] FDA and  has been authorized for detection and/or diagnosis of SARS-CoV-2 by FDA under an Emergency Use Authorization (EUA). This EUA will remain  in effect (meaning this test can be used) for the duration of the COVID-19 declaration under Section 564(b)(1) of the Act, 21 U.S.C.section 360bbb-3(b)(1), unless the authorization is terminated  or revoked sooner.       Influenza A by PCR NEGATIVE NEGATIVE Final   Influenza B by PCR NEGATIVE NEGATIVE Final    Comment: (NOTE) The Xpert Xpress SARS-CoV-2/FLU/RSV plus assay is intended as an aid in the diagnosis of influenza from Nasopharyngeal swab specimens and should not be used as a sole basis for treatment. Nasal washings and aspirates are unacceptable for Xpert Xpress SARS-CoV-2/FLU/RSV testing.  Fact Sheet for Patients: EntrepreneurPulse.com.au  Fact Sheet for Healthcare Providers: IncredibleEmployment.be  This test is not yet approved or cleared by the [place] FDA and has been authorized for detection and/or diagnosis of SARS-CoV-2 by FDA under an Emergency Use Authorization (EUA). This EUA will remain in  effect (meaning this test can be used) for the duration of the COVID-19 declaration under Section 564(b)(1) of the Act, 21 U.S.C. section 360bbb-3(b)(1), unless the authorization is terminated or revoked.  Performed at [facility], [address], [place], [place] [address]       Radiology Studies: PERIPHERAL VASCULAR CATHETERIZATION  Result Date: 12/13/2020 Patient name: TAJ ARTEAGA MRN: [id number] DOB: 10-Jun-1946 Sex: female 12/13/2020 Pre-operative Diagnosis: Toe ulcer Post-operative diagnosis:  Same Surgeon:  Annamarie Major Procedure Performed:  1.  Ultrasound-guided access, left femoral artery  2.  Abdominal aortogram with CO2  3.  Bilateral lower extremity runoff  4.  Drug-coated balloon angioplasty, right superficial femoral artery  5.  Angioplasty, right anterior tibial artery  6.  Angioplasty, right posterior tibial artery  7.  Intra-arterial administration of nitroglycerin  8.  Conscious sedation, 114 minutes Indications: This is a 74 year old female with a right 5th toe ulcer.  She comes in today for arterial evaluation. Procedure:  The patient was identified in the holding area and taken  to room 8.  The patient was then placed supine on the table and prepped and draped in the usual sterile fashion.  A time out was called.  Conscious sedation was administered with the use of IV fentanyl and Versed under continuous physician and nurse monitoring.  Heart rate, blood pressure, and oxygen saturation were continuously monitored.  Total sedation time was 114 minutes.  Ultrasound was used to evaluate the left common femoral artery.  It was patent .  A digital ultrasound image was acquired.  A micropuncture needle was used to access the left common femoral artery under ultrasound guidance.  An 018 wire was advanced without resistance and a micropuncture sheath was placed.  The 018 wire was removed and a benson wire was placed.  The micropuncture sheath was exchanged for a 5 french sheath.  An  omniflush catheter was advanced over the wire to the level of L-1.  An abdominal angiogram with CO2 was obtained.  Next, the omniflush catheter was pulled out of the aortic bifurcation and bilateral runoff with CO2 was obtained and till the images were suboptimal.  I then used a Omni Flush catheter across the aortic bifurcation, placed the catheter and the common femoral artery and obtain contrasted images of the right leg. Findings:  Aortogram: No visualized renal artery stenosis is identified.  The infrarenal abdominal aorta is patent without stenosis.  The bilateral common iliac arteries were patent without significant stenosis.  Right Lower Extremity: The right common femoral and profundofemoral artery were patent without stenosis.  The superficial femoral artery was patent there was a 70% stenosis which was focal in the proximal portion of the artery, with length about 1 cm.  The remaining portion of the superficial femoral and popliteal artery without stenosis.  The dominant runoff vessel appeared to be the posterior tibial.  The peroneal artery appeared to be patent down to the ankle.  Anterior tibial appeared to be occluded at the ankle.  Left Lower Extremity: The left common femoral and profundofemoral artery were patent.  There was a mid superficial femoral artery stenosis.  Runoff was not able to be obtained secondary to patient movement and use of CO2 Intervention: At this point, the decision was made to proceed with intervention.  A 6 French 45 cm sheath was advanced over the aortic bifurcation and placed into the right common femoral artery.  The patient was fully heparinized.  ACT measurements were 270.  I then used a 014 [place] core wire and easily advanced this across the lesion in the superficial femoral artery.  I elected to treat this with primary drug-coated balloon angioplasty given the short segment and the small caliber vessel.  The balloon was taken to 4 atm for 3 minutes.  Completion  imaging showed resolution of the stenosis. Additional imaging of the leg showed either spasm or embolic occlusion of the posterior tibial and possibly the anterior tibial artery.  At this point I I placed a V 18 wire and was able to easily navigate this into the posterior tibial artery across the ankle.  I then performed balloon angioplasty of the posterior tibial artery using a 3 x 100 [place] balloon, taking each inflation to 4 atm for 2 minutes.  Once balloon inflation was completed, intra-arterial administration of 300 mcg of nitroglycerin was given.  Completion imaging was performed which showed widely patent posterior tibial artery across ankle.  I then elected to select the anterior tibial artery and was able to get a wire across the  ankle however it did not go into the plantar arch.  I did perform balloon angioplasty of the anterior tibial artery with a 3 mm balloon for 2 minutes.  Intra-arterial administration of 300 mcg nitroglycerin was given.  This showed inline flow of the anterior tibial artery down to the ankle however it was occluded distal to this.  I then performed a final injection of the leg with a catheter in the popliteal artery, and this showed that the posterior tibial artery had reoccluded.  This was then selected with the balloon catheter and wire and better imaging was obtained that showed a possible dissection from prior angioplasty.  I then reinserted the 3 mm balloon and perform balloon angioplasty at a low atmospheric pressure for 3 minutes of this area.  Completion imaging showed the vessel to be widely patent with resolution of the stenosis.  Catheters and wires were removed.  Patient taken the holding area for sheath pull once her coagulation profile corrects Impression:  #1  A total of 45 cc of contrast was utilized for the procedure  #2  Focal right superficial femoral artery stenosis, 70% was treated with drug-coated balloon angioplasty with residual lesion less than 10%.  #3   Upon completion of the intervention, runoff through the posterior tibial artery appeared to be suboptimal either through embolization or spasm.  I therefore proceeded to perform balloon angioplasty of the posterior tibial artery as well as anterior tibial artery.  After intervention these vessels were widely patent.  Theotis Burrow, M.D., [facility] [place] Office: [phone] Pager:  [phone]   Scheduled Meds: . (feeding supplement) PROSource Plus  30 mL Oral BID BM  . aspirin EC  81 mg Oral Daily  . clopidogrel  75 mg Oral Q breakfast  . feeding supplement  237 mL Oral BID BM  . flecainide  50 mg Oral BID  . lubiprostone  24 mcg Oral BID WC  . mometasone-formoterol  2 puff Inhalation BID  . octreotide  100 mcg Subcutaneous Q12H  . pantoprazole  40 mg Oral BID AC  . rosuvastatin  10 mg Oral Daily  . sodium chloride flush  3 mL Intravenous Q12H   Continuous Infusions: . sodium chloride    . vancomycin Stopped (12/11/20 1805)     LOS: 9 days   Time spent: 35 minutes   Taiven Greenley Loann Quill, MD [facility]  If 7PM-7AM, please contact night-coverage [email or web address] 12/14/2020, 4:04 PM

## 2020-12-14 NOTE — Evaluation (Signed)
Occupational Therapy Evaluation Patient Details Name: [name] [name] MRN: [id number] DOB: 20-May-1946 Today's Date: 12/14/2020    History of Present Illness 74 year old female with past medical history for paroxysmal atrial fibrillation, hypertension, dyslipidemia, diastolic heart failure, and GERD.  Patient developed a syncope episode, preceded by fatigue for several days.  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   Clinical Impression   Patient originally evaluated last week with no needs, now post 5th digit amputation to R foot.  Patient is doing well overall, she is hoping to go home tomorrow, 12/23.  Only deficit is pain to the surgical site.  Her sister and niece will be staying with her as long as she needs, thus she is not interested in [facility] OT.  Patient is requesting a 4WRW for home use.  OT will follow her as long as she remains in the acute setting with skilled needs.       Follow Up Recommendations  No OT follow up    Equipment Recommendations  None recommended by OT    Recommendations for Other Services       Precautions / Restrictions Precautions Precautions: Fall Restrictions Weight Bearing Restrictions: No Other Position/Activity Restrictions: No Weight Bearing status given.  Had patient weight bear through heel of foot as a precaution and use RW to keep additional weight off her foot.      Mobility Bed Mobility Overal bed mobility: Independent                  Transfers Overall transfer level: Modified independent               General transfer comment: sit to stand and stand to pivot.    Balance Overall balance assessment: Needs assistance Sitting-balance support: No upper extremity supported Sitting balance-Leahy Scale: Good     Standing balance support: Bilateral upper extremity supported Standing balance-Leahy Scale: Fair Standing balance comment: using RW for additional stability.                           ADL  either performed or assessed with clinical judgement   ADL Overall ADL's : Needs assistance/impaired Eating/Feeding: Independent   Grooming: Wash/dry hands;Wash/dry face;Supervision/safety;Standing               Lower Body Dressing: Minimal assistance;Sit to/from stand Lower Body Dressing Details (indicate cue type and reason): assist to donn sock to R foot over bulk dressing. Toilet Transfer: Supervision/safety;RW   Toileting- Water quality scientist and Hygiene: Independent;Sit to/from stand               Vision Patient Visual Report: No change from baseline       Perception     Praxis      Pertinent Vitals/Pain Faces Pain Scale: Hurts little more Pain Location: R foot Pain Descriptors / Indicators: Aching Pain Intervention(s): Monitored during session     Hand Dominance Right   Extremity/Trunk Assessment Upper Extremity Assessment Upper Extremity Assessment: Overall WFL for tasks assessed   Lower Extremity Assessment Lower Extremity Assessment: Defer to PT evaluation   Cervical / Trunk Assessment Cervical / Trunk Assessment: Normal   Communication Communication Communication: No difficulties   Cognition Arousal/Alertness: Awake/alert Behavior During Therapy: WFL for tasks assessed/performed Overall Cognitive Status: Within Functional Limits for tasks assessed  Home Living Family/patient expects to be discharged to:: Private residence Living Arrangements: Alone Available Help at Discharge: Family;Available PRN/intermittently Type of Home: House Home Access: Stairs to enter [employer] of Steps: 1   Home Layout: One level     Bathroom Shower/Tub: Teacher, early years/pre: Standard     Home Equipment: None          Prior Functioning/Environment Level of Independence: Independent                 OT Problem List: Pain      OT  Treatment/Interventions:      OT Goals(Current goals can be found in the care plan section) Acute Rehab OT Goals Patient Stated Goal: I think I'm going home tomorrow OT Goal Formulation: With patient Time For Goal Achievement: 12/28/20 Potential to Achieve Goals: Good ADL Goals Pt Will Perform Grooming: Independently;standing Pt Will Perform Lower Body Bathing: sit to/from stand;with modified independence Pt Will Perform Lower Body Dressing: with modified independence;sit to/from stand  OT Frequency: Min 2X/week   Barriers to D/C:    none noted       Co-evaluation              AM-PAC OT "6 Clicks" Daily Activity     Outcome Measure Help from another person eating meals?: None Help from another person taking care of personal grooming?: A Little Help from another person toileting, which includes using toliet, bedpan, or urinal?: A Little Help from another person bathing (including washing, rinsing, drying)?: A Little Help from another person to put on and taking off regular upper body clothing?: None Help from another person to put on and taking off regular lower body clothing?: A Little 6 Click Score: 20   End of Session Equipment Utilized During Treatment: Gait belt;Rolling walker Nurse Communication: Mobility status  Activity Tolerance: Patient tolerated treatment well Patient left: in bed;with call bell/phone within reach  OT Visit Diagnosis: Pain Pain - Right/Left: Right Pain - part of body: Ankle and joints of foot                Time: 4193-7902 OT Time Calculation (min): 22 min Charges:  OT General Charges $OT Visit: 1 Visit OT Evaluation $OT Eval Moderate Complexity: 1 Mod  12/14/2020  Rich, OTR/L  Acute Rehabilitation Services  Office:  [place] 12/14/2020, 3:21 PM

## 2020-12-14 NOTE — Op Note (Signed)
° ° °  OPERATIVE REPORT  DATE OF SURGERY: 12/14/2020  PATIENT: [name] [name], 74 y.o. female MRN: [id number]  DOB: 1946/06/04  PRE-OPERATIVE DIAGNOSIS: Gangrene right fifth toe  POST-OPERATIVE DIAGNOSIS:  Same  PROCEDURE: Right fifth toe amputation  SURGEON:  Curt Jews, M.D.  PHYSICIAN ASSISTANT: Nurse  The assistant was needed for exposure and to expedite the case  ANESTHESIA: LMA  EBL: per anesthesia record  Total I/O In: 200 [IV Piggyback:200] Out: 10 [Blood:10]  BLOOD ADMINISTERED: none  DRAINS: none  SPECIMEN: none  COUNTS CORRECT:  YES  PATIENT DISPOSITION:  PACU - hemodynamically stable  PROCEDURE DETAILS: Patient was taken to room placed position with area of the right foot was prepped draped you sterile fashion.  Patient had dry gangrene of the right fifth toe.  [place] incision was made at the base of the foot and carried down to the bone.  There was no evidence of deep space infection and there was bleeding at this level.  The digital bone was resected.  The metatarsal head was not resected.  Wound was irrigated with saline.  Hemostasis to electrocautery.  The wound was closed with 3-0 nylon mattress sutures.  Sterile dressing was applied and the patient was transferred to the recovery in stable condition   [name] [name], M.D., [facility] 12/14/2020 1:31 PM

## 2020-12-14 NOTE — Progress Notes (Signed)
Mobility Specialist: Progress Note   12/14/20 1731  Mobility  Activity Ambulated in hall  Level of Assistance Modified independent, requires aide device or extra time  Assistive Device Four wheel walker  Distance Ambulated (ft) 490 ft  Mobility Response Tolerated well  Mobility performed by Mobility specialist  $Mobility charge 1 Mobility   Pre-Mobility: 80 HR, 163/69 BP, 100% SpO2 Post-Mobility: 101 HR, 189/69 BP, 98% SpO2  Pt asx during ambulation. Pt motivated to continue ambulation.   [facility] [name] [name] Mobility Specialist

## 2020-12-14 NOTE — Anesthesia Preprocedure Evaluation (Addendum)
Anesthesia Evaluation  Patient identified by MRN, date of birth, ID band Patient awake    Reviewed: Allergy & Precautions, NPO status , Patient's Chart, lab work & pertinent test results, reviewed documented beta blocker date and time   Airway Mallampati: I  TM Distance: >3 FB Neck ROM: Full    Dental  (+) Missing, Dental Advisory Given,    Pulmonary sleep apnea and Continuous Positive Airway Pressure Ventilation , former smoker,    Pulmonary exam normal breath sounds clear to auscultation       Cardiovascular hypertension, Pt. on medications and Pt. on home beta blockers Normal cardiovascular exam+ dysrhythmias Atrial Fibrillation  Rhythm:Regular Rate:Normal  TTE 2021 1. Left ventricular ejection fraction, by estimation, is 65 to 70%. The  left ventricle has normal function. The left ventricle has no regional  wall motion abnormalities. There is moderate left ventricular hypertrophy  of the posterior segment. Left  ventricular diastolic parameters are consistent with Grade II diastolic  dysfunction (pseudonormalization). Elevated left atrial pressure.  2. Right ventricular systolic function is normal. The right ventricular  size is normal. There is normal pulmonary artery systolic pressure.  3. Left atrial size was moderately dilated.  4. Nodular calcification on posterior mitral leaflet measuring 1.56 cm x  1.65 cm. This may represent a calcified vegetation. The mitral valve is  degenerative. Mild mitral valve regurgitation. Moderate annular  calcification. Chordal calcification also  noted.  5. The aortic valve is tricuspid. Aortic valve regurgitation is not  visualized. Mild aortic valve sclerosis is present, with no evidence of  aortic valve stenosis.  6. The inferior vena cava is normal in size with greater than 50%  respiratory variability, suggesting right atrial pressure of 3 mmHg.   Neuro/Psych negative  neurological ROS  negative psych ROS   GI/Hepatic Neg liver ROS, PUD, GERD  Medicated and Controlled,  Endo/Other  negative endocrine ROS  Renal/GU Renal InsufficiencyRenal disease (Cr 2.64, K 3.6)  negative genitourinary   Musculoskeletal negative musculoskeletal ROS (+)   Abdominal   Peds  Hematology  (+) Blood dyscrasia (on eliquis, Hgb 9.1), anemia ,   Anesthesia Other Findings   Reproductive/Obstetrics                            Anesthesia Physical Anesthesia Plan  ASA: III  Anesthesia Plan: General   Post-op Pain Management:    Induction: Intravenous  PONV Risk Score and Plan: 3 and Propofol infusion, Treatment may vary due to age or medical condition, Ondansetron and Dexamethasone  Airway Management Planned: LMA  Additional Equipment:   Intra-op Plan:   Post-operative Plan: Extubation in OR  Informed Consent: I have reviewed the patients History and Physical, chart, labs and discussed the procedure including the risks, benefits and alternatives for the proposed anesthesia with the patient or authorized representative who has indicated his/her understanding and acceptance.     Dental advisory given  Plan Discussed with: CRNA  Anesthesia Plan Comments:        Anesthesia Quick Evaluation

## 2020-12-14 NOTE — Progress Notes (Signed)
PT Cancellation Note  Patient Details Name: DAISIE HAFT MRN: [id number] DOB: Jul 28, 1946   Cancelled Treatment:    Reason Eval/Treat Not Completed: Patient at procedure or test/unavailable (5th toe amputation).  Wyona Almas, PT, DPT Acute Rehabilitation Services Pager [phone] Office [phone]    Deno Etienne 12/14/2020, 10:09 AM

## 2020-12-14 NOTE — Progress Notes (Signed)
  Progress Note    12/14/2020 8:56 AM Day of Surgery  Subjective:  No complaints. States she is feeling good. Continues to have pain in right 5th toe   Vitals:   12/14/20 0420 12/14/20 0826  BP: (!) 151/75 (!) 163/66  Pulse: 62 60  Resp: 14 11  Temp: 98.7 F (37.1 C) 98 F (36.7 C)  SpO2: 98%    Physical Exam: Cardiac:  regular Lungs: non labored Extremities: left common femoral access site clean, dry and intact without any bleeding or hematoma Neurologic: alert and oriented  CBC    Component Value Date/Time   WBC 8.6 12/14/2020 0148   RBC 3.37 (L) 12/14/2020 0148   HGB 9.1 (L) 12/14/2020 0148   HCT 29.7 (L) 12/14/2020 0148   PLT 363 12/14/2020 0148   MCV 88.1 12/14/2020 0148   MCH 27.0 12/14/2020 0148   MCHC 30.6 12/14/2020 0148   RDW 16.2 (H) 12/14/2020 0148   LYMPHSABS 1.5 12/14/2020 0148   MONOABS 0.7 12/14/2020 0148   EOSABS 0.5 12/14/2020 0148   BASOSABS 0.0 12/14/2020 0148    BMET    Component Value Date/Time   NA 137 12/14/2020 0148   K 3.6 12/14/2020 0148   CL 101 12/14/2020 0148   CO2 25 12/14/2020 0148   GLUCOSE 134 (H) 12/14/2020 0148   BUN 28 (H) 12/14/2020 0148   CREATININE 2.64 (H) 12/14/2020 0148   CREATININE 1.17 (H) 05/06/2012 1000   CALCIUM 8.4 (L) 12/14/2020 0148   GFRNONAA 18 (L) 12/14/2020 0148   GFRAA 20 (L) 09/11/2020 0519    INR    Component Value Date/Time   INR 1.5 (H) 12/06/2020 0354     Intake/Output Summary (Last 24 hours) at 12/14/2020 0856 Last data filed at 12/14/2020 0200 Gross per 24 hour  Intake 1310.09 ml  Output --  Net 1310.09 ml     Assessment/Plan:  75 y.o. female is s/p   1.  Ultrasound-guided access, left femoral artery             2.  Abdominal aortogram with CO2             3.  Bilateral lower extremity runoff             4.  Drug-coated balloon angioplasty, right superficial femoral artery             5.  Angioplasty, right anterior tibial artery             6.  Angioplasty, right  posterior tibial artery             7.  Intra-arterial administration of nitroglycerin post Op Day 1 Lower extremities well perfused and warm. Brisk Doppler PT/ AT signals in right foot. Left femoral access site clean, dry and intact without hematoma. She is on schedule today to go to OR for right 5th toe amputation   [name] [facility] [phone] 12/14/2020 8:56 AM

## 2020-12-14 NOTE — Transfer of Care (Signed)
Immediate Anesthesia Transfer of Care Note  Patient: [name] [name]  Procedure(s) Performed: Right fifth toe amputation (Right )  Patient Location: PACU  Anesthesia Type:General  Level of Consciousness: awake and alert   Airway & Oxygen Therapy: Patient Spontanous Breathing  Post-op Assessment: Report given to RN and Post -op Vital signs reviewed and stable  Post vital signs: Reviewed and stable  Last Vitals:  Vitals Value Taken Time  BP    Temp    Pulse 69 12/14/20 1125  Resp 13 12/14/20 1125  SpO2 96 % 12/14/20 1125  Vitals shown include unvalidated device data.  Last Pain:  Vitals:   12/14/20 0826  TempSrc: Oral  PainSc:       Patients Stated Pain Goal: 0 (96/22/29 7989)  Complications: No complications documented.

## 2020-12-15 ENCOUNTER — Encounter (HOSPITAL_COMMUNITY): Payer: Self-pay | Admitting: Vascular Surgery

## 2020-12-15 LAB — CBC
HCT: 29.7 % — ABNORMAL LOW (ref 36.0–46.0)
Hemoglobin: 9.1 g/dL — ABNORMAL LOW (ref 12.0–15.0)
MCH: 26.7 pg (ref 26.0–34.0)
MCHC: 30.6 g/dL (ref 30.0–36.0)
MCV: 87.1 fL (ref 80.0–100.0)
Platelets: 400 10*3/uL (ref 150–400)
RBC: 3.41 MIL/uL — ABNORMAL LOW (ref 3.87–5.11)
RDW: 15.9 % — ABNORMAL HIGH (ref 11.5–15.5)
WBC: 7.1 10*3/uL (ref 4.0–10.5)
nRBC: 0 % (ref 0.0–0.2)

## 2020-12-15 LAB — BASIC METABOLIC PANEL
Anion gap: 12 (ref 5–15)
BUN: 31 mg/dL — ABNORMAL HIGH (ref 8–23)
CO2: 23 mmol/L (ref 22–32)
Calcium: 8.6 mg/dL — ABNORMAL LOW (ref 8.9–10.3)
Chloride: 98 mmol/L (ref 98–111)
Creatinine, Ser: 2.45 mg/dL — ABNORMAL HIGH (ref 0.44–1.00)
GFR, Estimated: 20 mL/min — ABNORMAL LOW (ref >60)
Glucose, Bld: 145 mg/dL — ABNORMAL HIGH (ref 70–99)
Potassium: 4.3 mmol/L (ref 3.5–5.1)
Sodium: 133 mmol/L — ABNORMAL LOW (ref 135–145)

## 2020-12-15 MED ORDER — OCTREOTIDE ACETATE 100 MCG/ML IJ SOLN
100.0000 ug | Freq: Two times a day (BID) | INTRAMUSCULAR | Status: DC
  Filled 2020-12-15: qty 7
  Filled 2020-12-15: qty 1

## 2020-12-15 MED ORDER — OXYCODONE HCL 5 MG PO CAPS: 10.0000 mg | ORAL_CAPSULE | Freq: Four times a day (QID) | ORAL | 0 refills | Status: DC | PRN

## 2020-12-15 MED ORDER — ASPIRIN 81 MG PO TBEC: 81.0000 mg | DELAYED_RELEASE_TABLET | Freq: Every day | ORAL | 11 refills | Status: DC

## 2020-12-15 MED ORDER — PANTOPRAZOLE SODIUM 40 MG PO TBEC: 40.0000 mg | DELAYED_RELEASE_TABLET | Freq: Two times a day (BID) | ORAL | 0 refills | Status: DC

## 2020-12-15 MED ORDER — OCTREOTIDE ACETATE 100 MCG/ML IJ SOLN: 100.0000 ug | Freq: Two times a day (BID) | INTRAMUSCULAR | Status: DC

## 2020-12-15 MED ORDER — ENSURE ENLIVE PO LIQD: 237.0000 mL | Freq: Two times a day (BID) | ORAL | 12 refills | Status: DC

## 2020-12-15 MED ORDER — ROSUVASTATIN CALCIUM 10 MG PO TABS: 10.0000 mg | ORAL_TABLET | Freq: Every day | ORAL | 0 refills | Status: DC

## 2020-12-15 MED ORDER — PROSOURCE PLUS PO LIQD: 30.0000 mL | Freq: Two times a day (BID) | ORAL | Status: DC

## 2020-12-15 MED ORDER — VANCOMYCIN HCL IN DEXTROSE 1-5 GM/200ML-% IV SOLN: 1000.0000 mg | INTRAVENOUS | Status: DC

## 2020-12-15 MED ORDER — APIXABAN 5 MG PO TABS
5.0000 mg | ORAL_TABLET | Freq: Two times a day (BID) | ORAL | Status: DC
  Administered 2020-12-15: 5 mg via ORAL
  Filled 2020-12-15: qty 1

## 2020-12-15 MED ORDER — OCTREOTIDE ACETATE 100 MCG/ML IJ SOLN: 100.0000 ug | Freq: Two times a day (BID) | INTRAMUSCULAR | 0 refills | Status: DC

## 2020-12-15 NOTE — Progress Notes (Addendum)
Physical Therapy Treatment Patient Details Name: [name] [name] MRN: [id number] DOB: November 12, 1946 Today's Date: 12/15/2020    History of Present Illness 74 year old female with past medical history for paroxysmal atrial fibrillation, hypertension, dyslipidemia, diastolic heart failure, and GERD.  Patient developed a syncope episode, preceded by fatigue for several days.  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    PT Comments    Pt admitted with above diagnosis. Pt was able to ambulate with rollator in hallway with supervision.  Sisster and pt aware to wear Darco shoe at all times until MD tells pt otherwise. Declined practice on steps. Pt ready to d/c today and should do well at home.  Pt refuses need for HHPT.  If pt does not go home today will continued inpt PT.    Follow Up Recommendations  No PT follow up     Equipment Recommendations  3in1 (PT) (rollator)    Recommendations for Other Services       Precautions / Restrictions Precautions Precautions: Fall Restrictions Weight Bearing Restrictions: Yes RLE Weight Bearing: Partial weight bearing Other Position/Activity Restrictions: weight bear on right  Heel only on Darco shoe    Mobility  Bed Mobility Overal bed mobility: Independent                Transfers Overall transfer level: Modified independent Equipment used: Rolling walker (2 wheeled)             General transfer comment: Sister donned Darco shoe for pt and will assist pt at home. sit to stand without assist. Cues on how to lock and unlock brakes on the rollator for transitions.  Ambulation/Gait Ambulation/Gait assistance: Supervision Gait Distance (Feet): 150 Feet Assistive device: 4-wheeled walker Gait Pattern/deviations: Decreased weight shift to right;Decreased stance time - right;Decreased step length - right;Antalgic   Gait velocity interpretation: <1.8 ft/sec, indicate of risk for recurrent falls General Gait Details: Overall  managing well with no LOB with rollator; good use of rollator - educated regarding techniques for sitting and resting in hallway   Stairs             Wheelchair Mobility    Modified Rankin (Stroke Patients Only)       Balance Overall balance assessment: Needs assistance Sitting-balance support: No upper extremity supported Sitting balance-Leahy Scale: Good     Standing balance support: Bilateral upper extremity supported Standing balance-Leahy Scale: Fair Standing balance comment: using rollator for additional stability.                            Cognition Arousal/Alertness: Awake/alert Behavior During Therapy: WFL for tasks assessed/performed Overall Cognitive Status: Within Functional Limits for tasks assessed                                        Exercises      General Comments        Pertinent Vitals/Pain Pain Assessment: Faces Faces Pain Scale: Hurts little more Pain Location: R foot Pain Descriptors / Indicators: Aching Pain Intervention(s): Limited activity within patient's tolerance;Monitored during session;Premedicated before session;Repositioned    Home Living Family/patient expects to be discharged to:: Private residence Living Arrangements: Alone Available Help at Discharge: Family;Available PRN/intermittently;Available 24 hours/day (sister) Type of Home: House Home Access: Stairs to enter   Home Layout: One level Home Equipment: None  Prior Function Level of Independence: Independent          PT Goals (current goals can now be found in the care plan section) Progress towards PT goals: Progressing toward goals    Frequency    Min 3X/week      PT Plan Discharge plan needs to be updated;Equipment recommendations need to be updated    Co-evaluation              AM-PAC PT "6 Clicks" Mobility   Outcome Measure  Help needed turning from your back to your side while in a flat bed without  using bedrails?: None Help needed moving from lying on your back to sitting on the side of a flat bed without using bedrails?: None Help needed moving to and from a bed to a chair (including a wheelchair)?: None Help needed standing up from a chair using your arms (e.g., wheelchair or bedside chair)?: None Help needed to walk in hospital room?: None Help needed climbing 3-5 steps with a railing? : A Little 6 Click Score: 23    End of Session Equipment Utilized During Treatment: Gait belt Activity Tolerance: Patient tolerated treatment well Patient left: with call bell/phone within reach;with family/visitor present;in bed Nurse Communication: Mobility status PT Visit Diagnosis: Other abnormalities of gait and mobility (R26.89)     Time: 2863-8177 PT Time Calculation (min) (ACUTE ONLY): 22 min  Charges:  $Gait Training: 8-22 mins                     [name] [name],PT Acute Rehabilitation Services Pager:  [phone]  Office:  [place] 12/15/2020, 1:35 PM

## 2020-12-15 NOTE — Progress Notes (Signed)
Orthopedic Tech Progress Note Patient Details:  [name] [name] 1946-07-09 [id number]  Ortho Devices Type of Ortho Device: Darco shoe Ortho Device/Splint Location: RLE Ortho Device/Splint Interventions: Ordered,Application,Adjustment   Post Interventions Patient Tolerated: Well Instructions Provided: Care of device,Adjustment of device,Poper ambulation with device   [name] 12/15/2020, 11:28 AM

## 2020-12-15 NOTE — Progress Notes (Signed)
Pt and her sister were taught how to administer SQ shot. Pt's sister administered SQ shot under RN supervision. Pt tolerated well. Pt and her sister do not have any additional questions or concerns about shot administration. They are awaiting for a rollator and 3:1.

## 2020-12-15 NOTE — TOC Initial Note (Signed)
Transition of Care [facility]) - Initial/Assessment Note    Patient Details  Name: [name] [name] MRN: [id number] Date of Birth: October 18, 1946  Transition of Care [facility]) CM/SW Contact:    Curlene Labrum, RN Phone Number: 12/15/2020, 12:06 PM  Clinical Narrative:                 Case management met with the patient and sister at the bedside for discharge plans for home today.  I gave medicare choice regarding dme and the patient did not have a preference.  No outpatient PT nor HH is needed after PT evaluation.  I called [employer] and spoke with Freda Munro, [place] with [employer] and rollator and 3:1 ordered and will be delivered to the hospital room.  Once the dme arrives, the patient can be discharged home with sister by car.  Expected Discharge Plan: Home/Self Care Barriers to Discharge: No Barriers Identified,Equipment Delay   Patient Goals and CMS Choice Patient states their goals for this hospitalization and ongoing recovery are:: Patient planning to discharge home today with sister. CMS Medicare.gov Compare Post Acute Care list provided to:: Patient Choice offered to / list presented to : Patient  Expected Discharge Plan and Services Expected Discharge Plan: Home/Self Care   Discharge Planning Services: CM Consult Post Acute Care Choice: Durable Medical Equipment Living arrangements for the past 2 months: Single Family Home Expected Discharge Date: 12/15/20               DME Arranged: 3-N-1,Walker rolling with seat DME Agency: [employer] Date DME Agency Contacted: 12/15/20 Time DME Agency Contacted: 1205 Representative spoke with at DME Agency: Vikki Ports, [employer]            Prior Living Arrangements/Services Living arrangements for the past 2 months: [place] with:: Self Patient language and need for interpreter reviewed:: Yes Do you feel safe going back to the place where you live?: Yes      Need for Family Participation in Patient Care: Yes (Comment) Care giver  support system in place?: Yes (comment) Current home services: DME Criminal Activity/Legal Involvement Pertinent to Current Situation/Hospitalization: No - Comment as needed  Activities of Daily Living Home Assistive Devices/Equipment: None ADL Screening (condition at time of admission) Patient's cognitive ability adequate to safely complete daily activities?: Yes Is the patient deaf or have difficulty hearing?: No Does the patient have difficulty seeing, even when wearing glasses/contacts?: No Does the patient have difficulty concentrating, remembering, or making decisions?: No Patient able to express need for assistance with ADLs?: Yes Does the patient have difficulty dressing or bathing?: No Independently performs ADLs?: Yes (appropriate for developmental age) Does the patient have difficulty walking or climbing stairs?: No Weakness of Legs: None Weakness of Arms/Hands: None  Permission Sought/Granted Permission sought to share information with : Case Manager Permission granted to share information with : Yes, Verbal Permission Granted     Permission granted to share info w AGENCY: [employer]        Emotional Assessment Appearance:: Appears stated age Attitude/Demeanor/Rapport: Self-Confident Affect (typically observed): Accepting Orientation: : Oriented to Self,Oriented to Place,Oriented to  Time,Oriented to Situation Alcohol / Substance Use: Not Applicable Psych Involvement: No (comment)  Admission diagnosis:  GI bleed [K92.2] Upper GI bleed [K92.2] Patient Active Problem List   Diagnosis Date Noted  . Upper GI bleed   . Dry gangrene ([place])   . GI bleed 12/05/2020  . Acute blood loss anemia 11/24/2020  . Heme positive stool   . Iron deficiency  anemia due to chronic blood loss   . Symptomatic anemia 11/23/2020  . Grade II diastolic dysfunction 29/92/4268  . Hyponatremia 11/23/2020  . Symptomatic bradycardia 10/20/2020  . AKI (acute kidney injury) ([place]) 10/20/2020  .  Microcytic anemia 10/20/2020  . OSA (obstructive sleep apnea) 08/09/2020  . Paroxysmal atrial fibrillation ([place]) 06/15/2020  . Bradycardia with 31-40 beats per minute 06/15/2020  . Snoring 06/15/2020  . Intermittent palpitations 06/15/2020  . Near syncope 06/15/2020  . Hypertension, uncontrolled 06/15/2020  . Atrial fibrillation with RVR ([place]) 05/04/2020  . Hyperlipidemia   . Gout   . Encounter for screening colonoscopy   . Hemorrhoid 12/16/2018  . GERD (gastroesophageal reflux disease) 01/29/2018  . Constipation 07/17/2017  . Rectal bleeding 01/30/2017  . Chest pain 12/09/2013  . Hypokalemia 05/01/2012  . Renal insufficiency 05/01/2012  . Essential hypertension, benign 05/03/2010  . VASOVAGAL SYNCOPE 05/03/2010   PCP:  Andres Shad, MD Pharmacy:   [id number] [place] [place] [id number]: [phone]: [phone] [facility] [place] [address] [id number]: [phone]: [phone]    Social Determinants of Health (SDOH) Interventions    Readmission Risk Interventions Readmission Risk Prevention Plan 12/15/2020 11/24/2020  Transportation Screening Complete Complete  Medication Review ([place] Complete Complete  PCP or Specialist appointment within 3-5 days of discharge Complete -  [facility] [facility] Consult Complete Complete  SW Recovery Care/Counseling Consult Complete Complete  Palliative Care Screening Complete Not A[place]aceComplete Not Applicable  Some recent data might be hidden

## 2020-12-15 NOTE — Care Management (Cosign Needed)
    Durable Medical Equipment  (From admission, onward)         Start     Ordered   12/15/20 1201  For home use only DME 4 wheeled rolling walker with seat  Once       Question:  Patient needs a walker to treat with the following condition  Answer:  Amputation of fifth toe of right foot ([name] [name])   12/15/20 1202   12/15/20 1201  For home use only DME 3 n 1  Once        12/15/20 1202

## 2020-12-15 NOTE — Plan of Care (Signed)
  Problem: Education: Goal: Knowledge of General Education information will improve Description: Including pain rating scale, medication(s)/side effects and non-pharmacologic comfort measures Outcome: Adequate for Discharge   

## 2020-12-15 NOTE — Discharge Instructions (Signed)
Dry Dressing to fifth toe.  Walk with Darco black shoe to protect the toe. Clean with mild soap and water. Do not soak in bath tub. Do this daily. Do not use Vaseline or neosporin on the incision  Information on my medicine - ELIQUIS (apixaban)  This medication education was reviewed with me or my healthcare representative as part of my discharge preparation.  You were taking this medication previously prior to this hospital admission.  Why was Eliquis prescribed for you? Eliquis was prescribed for you to reduce the risk of a blood clot forming that can cause a stroke if you have a medical condition called atrial fibrillation (a type of irregular heartbeat).  What do You need to know about Eliquis ? Take your Eliquis TWICE DAILY - one tablet in the morning and one tablet in the evening with or without food. If you have difficulty swallowing the tablet whole please discuss with your pharmacist how to take the medication safely.  Take Eliquis exactly as prescribed by your doctor and DO NOT stop taking Eliquis without talking to the doctor who prescribed the medication.  Stopping may increase your risk of developing a stroke.  Refill your prescription before you run out.  After discharge, you should have regular check-up appointments with your healthcare provider that is prescribing your Eliquis.  In the future your dose may need to be changed if your kidney function or weight changes by a significant amount or as you get older.  What do you do if you miss a dose? If you miss a dose, take it as soon as you remember on the same day and resume taking twice daily.  Do not take more than one dose of ELIQUIS at the same time to make up a missed dose.  Important Safety Information A possible side effect of Eliquis is bleeding. You should call your healthcare provider right away if you experience any of the following: ? Bleeding from an injury or your nose that does not stop. ? Unusual colored  urine (red or dark brown) or unusual colored stools (red or black). ? Unusual bruising for unknown reasons. ? A serious fall or if you hit your head (even if there is no bleeding).  Some medicines may interact with Eliquis and might increase your risk of bleeding or clotting while on Eliquis. To help avoid this, consult your healthcare provider or pharmacist prior to using any new prescription or non-prescription medications, including herbals, vitamins, non-steroidal anti-inflammatory drugs (NSAIDs) and supplements.  This website has more information on Eliquis (apixaban): http://www.eliquis.com/eliquis/home

## 2020-12-15 NOTE — Progress Notes (Addendum)
  Progress Note    12/15/2020 7:45 AM 1 Day Post-Op  Subjective:  No complaints. Up ambulating. Says pain is tolerable only a little on ambulation   Vitals:   12/15/20 0009 12/15/20 0452  BP: (!) 141/61 (!) 143/56  Pulse: 68 63  Resp: 15 16  Temp: 97.8 F (36.6 C) 98.2 F (36.8 C)  SpO2: 97% 96%   Physical Exam: Cardiac:  regular Lungs: non labored Incisions: Right foot just dressed so I did not take down dressings. Dressings clean, dry and intact Extremities:  Well perfused and warm with Doppler AT/ PT signals Neurologic: alert and oriented  CBC    Component Value Date/Time   WBC 7.1 12/15/2020 0155   RBC 3.41 (L) 12/15/2020 0155   HGB 9.1 (L) 12/15/2020 0155   HCT 29.7 (L) 12/15/2020 0155   PLT 400 12/15/2020 0155   MCV 87.1 12/15/2020 0155   MCH 26.7 12/15/2020 0155   MCHC 30.6 12/15/2020 0155   RDW 15.9 (H) 12/15/2020 0155   LYMPHSABS 1.5 12/14/2020 0148   MONOABS 0.7 12/14/2020 0148   EOSABS 0.5 12/14/2020 0148   BASOSABS 0.0 12/14/2020 0148    BMET    Component Value Date/Time   NA 133 (L) 12/15/2020 0155   K 4.3 12/15/2020 0155   CL 98 12/15/2020 0155   CO2 23 12/15/2020 0155   GLUCOSE 145 (H) 12/15/2020 0155   BUN 31 (H) 12/15/2020 0155   CREATININE 2.45 (H) 12/15/2020 0155   CREATININE 1.17 (H) 05/06/2012 1000   CALCIUM 8.6 (L) 12/15/2020 0155   GFRNONAA 20 (L) 12/15/2020 0155   GFRAA 20 (L) 09/11/2020 0519    INR    Component Value Date/Time   INR 1.5 (H) 12/06/2020 0354     Intake/Output Summary (Last 24 hours) at 12/15/2020 0745 Last data filed at 12/14/2020 1300 Gross per 24 hour  Intake 440 ml  Output 10 ml  Net 430 ml     Assessment/Plan:  74 y.o. female is s/p  1. Ultrasound-guided access, left femoral artery 2. Abdominal aortogram with CO2 3. Bilateral lower extremity runoff 4. Drug-coated balloon angioplasty, right superficial femoral artery 5. Angioplasty, right  anterior tibial artery 6. Angioplasty, right posterior tibial artery 7. Intra-arterial administration of nitroglycerin post 3 Days Post-Op. 1 day post amputation of right 5th toe.she is doing well from vascular standpoint. Doppler PT/ At signal in right foot. Wound well appearing. She is stable for discharge from vascular standpoint. Eliquis okay to restart today. She will have Darco shoe to ambulate with. Her follow up has been arranged for 3 weeks with Dr. Tracey Harries, PA-C [facility] [phone] 12/15/2020 7:45 AM   I have seen and evaluated the patient. I agree with the PA note as documented above.  Now status post right leg revascularization with right fifth toe amp.  She has excellent Doppler signals.  Stable for discharge from a vascular standpoint.  Would prefer Plavix if able but understand in setting of recent upper GI bleed needs aspirin at a minimum.  I will arrange follow-up in 3 weeks for wound check, suture removal, and right leg arterial duplex.  Marty Heck, MD [facility] [place] Office: [phone]

## 2020-12-15 NOTE — Discharge Summary (Addendum)
Physician Discharge Summary  ELANDRA POWELL KGM:[id number] DOB: [birth year] DOA: 12/05/2020  PCP: Andres Shad, MD  Admit date: 12/05/2020 Discharge date: 12/15/2020  Admitted From: Home  disposition: Home  Recommendations for Outpatient Follow-up:  1. Follow-up with PCP in 1 week 2. Repeat CBC, BMP on follow-up visit 3. Resume Eliquis and start taking aspirin 81 mg daily 4. Continue octreotide subcutaneous twice daily 5. Follow-up with GI outpatient 6. Follow-up with vascular surgery as a scheduled 7. Continue Protonix twice daily  Home Health: None Equipment/Devices: None Discharge Condition: Stable CODE STATUS: Full code Diet recommendation: Heart healthy diet  Brief/Interim Summary: 74 year old female with past medical history for paroxysmal atrial fibrillation, hypertension, dyslipidemia, diastolic heart failure, and GERD. Patient developed a syncope episode, preceded by fatigue for several days.  Upon arrival to [facility] 129, potassium 3.6, chloride 92, bicarb 27, glucose 119, BUN 44, creatinine 3.89, white count 6.7, hemoglobin 7.0, hematocrit 23.3, platelets 310. SARS COVID-19 negative. Chest radiograph with mild cardiomegaly, no infiltrates. EKG 71 bpm, normal axis, normal intervals, sinus with sinus arrhythmia, no ST segment or T wave changes.  Patient received#1 PRBCcell transfusion, placed on intravenous pantoprazole and octreotide. Apixaban was held. 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   Further work up with foot MRIshowed nonspecific findings, concerning for possible osteomyelitis. ABI were consistent with mild to moderate peripheral vascular disease. General surgery consulted, [facility]  Patient received second unit PRBC 12/17 with good toleration.   Acute symptomatic anemia due to acute blood loss due  to upper GI bleed(small bowel angiodysplasia): -Status post 2 unit blood transfusion. -Iron stores with serum iron 11, TIBC 342, Transferrin saturation 3 and ferritin 71. -sp ferrous gluconate IV x2 doses  -Reviewed recent EGD on 12/5. -Patient started onsq octreotide twice daily and PPI twice daily per GI recommendations-No further endo work up.  -Discussed with GI PA-recommended to continue octreotide 100 mcg subcu twice daily at the time of discharge, okay to continue aspirin and Eliquis from GI standpoint.  Patient needs close follow-up with GI outpatient.  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 -Okay to discharge patient on aspirin from vascular standpoint.  Follow-up with Dr. Carlis Abbott in 3 weeks.  She will have Darco shoe to ambulate with.  Paroxysmal atrial fibrillation. Continued flecainide for rhythm control. Resumed Eliquis at the time of discharge as per vascular surgery and GI recommendations.  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  COPD: Remained stable.  No wheezing noted on exam.  Continued home inhalers.    Mild protein calorie malnutrition.Continued with feeding supplement as per dietitian recommendations.   HTN: Remained stable.  Resumed losartan at the time of discharge.  Hyperlipidemia: Continued statin  Chronic diastolic CHF: Patient remained euvolemic on exam.  Continued home Lasix at the time of discharge.  Anemia of chronic disease: -In the setting of CKD stage III-4.  H&H remained stable.  Continued ferrous sulfate at the time of discharge  Discharge Diagnoses:  Acute symptomatic anemia due to acute blood loss due to upper GI bleed in the setting of small bowel angiodysplasia Right foot fifth  toe gangrene in the setting of PVD Paroxysmal A. fib AKI on CKD stage IIIb-IV COPD Mild protein  calorie malnutrition Hypertension Hyperlipidemia Chronic diastolic CHF Anemia of chronic disease   Discharge Instructions  Discharge Instructions    Diet - low sodium heart healthy   Complete by: As directed    Discharge instructions   Complete by: As directed    Follow-up with PCP in 1 week Repeat CBC, BMP on follow-up visit Resume Eliquis and start taking aspirin 81 mg daily Continue octreotide subcutaneous twice daily Follow-up with GI outpatient Follow-up with vascular surgery as a scheduled Continue Protonix twice daily   Discharge wound care:   Complete by: As directed    As above   Increase activity slowly   Complete by: As directed      Allergies as of 12/15/2020      Reactions   Acetaminophen-codeine Hives   Allopurinol Hives   Amlodipine    Felt she retained fluid in her chest    Amoxicillin Hives   Did it involve swelling of the face/tongue/throat, SOB, or low BP? No Did it involve sudden or severe rash/hives, skin peeling, or any reaction on the inside of your mouth or nose? No Did you need to seek medical attention at a hospital or doctor's office? No When did it last happen?10 + years If all above answers are "NO", may proceed with cephalosporin use.   Doxycycline    " sick and weak"   Tramadol Hives      Medication List    STOP taking these medications   hydrALAZINE 50 MG tablet Commonly known as: APRESOLINE   omeprazole 40 MG capsule Commonly known as: PRILOSEC     TAKE these medications   (feeding supplement) PROSource Plus liquid Take 30 mLs by mouth 2 (two) times daily between meals.   feeding supplement Liqd Take 237 mLs by mouth 2 (two) times daily between meals.   acetaminophen 500 MG tablet Commonly known as: TYLENOL Take 1,000 mg by mouth every 6 (six) hours as needed for moderate pain.   albuterol 108 (90 Base) MCG/ACT  inhaler Commonly known as: VENTOLIN HFA Inhale 1-2 puffs into the lungs every 6 (six) hours as needed for wheezing or shortness of breath.   apixaban 5 MG Tabs tablet Commonly known as: ELIQUIS Take 1 tablet (5 mg total) by mouth 2 (two) times daily.   aspirin 81 MG EC tablet Take 1 tablet (81 mg total) by mouth daily. Swallow whole. Start taking on: December 16, 2020   cetirizine 10 MG tablet Commonly known as: ZYRTEC Take 10 mg by mouth daily as needed for allergies.   cyclobenzaprine 5 MG tablet Commonly known as: FLEXERIL Take 5 mg by mouth 3 (three) times daily as needed for muscle spasms.   diclofenac Sodium 1 % Gel Commonly known as: VOLTAREN Apply 1 application topically 4 (four) times daily as needed (pain).   ELDERBERRY PO Take 2 capsules by mouth daily.   ferrous sulfate 325 (65 FE) MG tablet Take 325 mg by mouth daily with breakfast.   flecainide 50 MG tablet Commonly known as: TAMBOCOR Take 1 tablet (50 mg total) by mouth 2 (two) times daily.   fluticasone 50 MCG/ACT nasal spray Commonly known as: FLONASE Place 1 spray into both nostrils daily as needed for allergies or rhinitis.   Fluticasone-Salmeterol 500-50 MCG/DOSE Aepb Commonly known as: ADVAIR Inhale 1 puff into the lungs every 12 (twelve) hours.   furosemide 40 MG tablet Commonly known as: LASIX Take 1 tablet (40 mg total) by mouth daily.   gabapentin 300 MG capsule Commonly  known as: NEURONTIN Take 300 mg by mouth 3 (three) times daily.   losartan 25 MG tablet Commonly known as: COZAAR Take 0.5 tablets (12.5 mg total) by mouth daily.   lubiprostone 24 MCG capsule Commonly known as: Amitiza Take 1 capsule (24 mcg total) by mouth in the morning and at bedtime.   meclizine 25 MG tablet Commonly known as: ANTIVERT Take 25 mg by mouth 3 (three) times daily as needed for dizziness.   octreotide 100 MCG/ML Soln injection Commonly known as: SANDOSTATIN Inject 1 mL (100 mcg total) into the  skin every 12 (twelve) hours.   pantoprazole 40 MG tablet Commonly known as: PROTONIX Take 1 tablet (40 mg total) by mouth 2 (two) times daily before a meal.   potassium chloride SA 20 MEQ tablet Commonly known as: KLOR-CON Take 20 mEq by mouth in the morning, at noon, and at bedtime.   rosuvastatin 10 MG tablet Commonly known as: CRESTOR Take 1 tablet (10 mg total) by mouth daily. Start taking on: December 16, 2020   vitamin C 250 MG tablet Commonly known as: ASCORBIC ACID Take 500 mg by mouth daily.   Vitamin D-3 25 MCG (1000 UT) Caps Take 1 capsule by mouth daily.            Discharge Care Instructions  (From admission, onward)         Start     Ordered   12/15/20 0000  Discharge wound care:       Comments: As above   12/15/20 1106          Follow-up Information    Evans Lance, MD Follow up.   Specialty: Cardiology Why: Cardiology Hospital Follow-up on 12/21/2020 at 3:15 PM.  Contact information: [place] [address] [phone]        Marty Heck, MD Follow up in 3 week(s).   Specialty: Vascular Surgery Why: the office will contact patient with follow up appointment Contact information: [address] [place] [address] [phone]              Allergies  Allergen Reactions  . Acetaminophen-Codeine Hives  . Allopurinol Hives  . Amlodipine     Felt she retained fluid in her chest   . Amoxicillin Hives    Did it involve swelling of the face/tongue/throat, SOB, or low BP? No Did it involve sudden or severe rash/hives, skin peeling, or any reaction on the inside of your mouth or nose? No Did you need to seek medical attention at a hospital or doctor's office? No When did it last happen?10 + years If all above answers are "NO", may proceed with cephalosporin use.   Marland Kitchen Doxycycline     " sick and weak"  . Tramadol Hives    Consultations:  GI  Vascular surgery   Procedures/Studies: MR FOOT RIGHT  WO CONTRAST  Result Date: 12/06/2020 CLINICAL DATA:  Fifth digit pain, question of osteomyelitis of the fifth digit EXAM: MRI OF THE RIGHT FOREFOOT WITHOUT CONTRAST TECHNIQUE: Multiplanar, multisequence MR imaging of the right was performed. No intravenous contrast was administered. COMPARISON:  None. FINDINGS: Bones/Joint/Cartilage There is minimally increased T2 hyperintense signal seen within the distal phalanx of the fifth digit with subtle T1 hypointensity. No areas of cortical destruction or periosteal reaction are seen. There is also mildly increased signal seen within the middle fifth phalanx. Ligaments The Lisfranc ligaments are intact. Muscles and Tendons The muscles surrounding the forefoot are intact without focal atrophy  or tear. The flexor and extensor tendons are intact. Soft tissues Mildly increased soft tissue swelling seen around the dorsum of the forefoot with focal soft tissue swelling around the distal aspect of the fifth digit. IMPRESSION: Findings which may be suggestive of reactive marrow versus early osteomyelitis involving the fifth distal phalanx. Dorsal subcutaneous edema. Electronically Signed   By: Prudencio Pair M.D.   On: 12/06/2020 11:10   PERIPHERAL VASCULAR CATHETERIZATION  Result Date: 12/13/2020 Patient name: CHASTA DESHPANDE MRN: [id number] DOB: 1946-09-14 Sex: female 12/13/2020 Pre-operative Diagnosis: Toe ulcer Post-operative diagnosis:  Same Surgeon:  Annamarie Major Procedure Performed:  1.  Ultrasound-guided access, left femoral artery  2.  Abdominal aortogram with CO2  3.  Bilateral lower extremity runoff  4.  Drug-coated balloon angioplasty, right superficial femoral artery  5.  Angioplasty, right anterior tibial artery  6.  Angioplasty, right posterior tibial artery  7.  Intra-arterial administration of nitroglycerin  8.  Conscious sedation, 114 minutes Indications: This is a 74 year old female with a right 5th toe ulcer.  She comes in today for arterial evaluation.  Procedure:  The patient was identified in the holding area and taken to room 8.  The patient was then placed supine on the table and prepped and draped in the usual sterile fashion.  A time out was called.  Conscious sedation was administered with the use of IV fentanyl and Versed under continuous physician and nurse monitoring.  Heart rate, blood pressure, and oxygen saturation were continuously monitored.  Total sedation time was 114 minutes.  Ultrasound was used to evaluate the left common femoral artery.  It was patent .  A digital ultrasound image was acquired.  A micropuncture needle was used to access the left common femoral artery under ultrasound guidance.  An 018 wire was advanced without resistance and a micropuncture sheath was placed.  The 018 wire was removed and a benson wire was placed.  The micropuncture sheath was exchanged for a 5 french sheath.  An omniflush catheter was advanced over the wire to the level of L-1.  An abdominal angiogram with CO2 was obtained.  Next, the omniflush catheter was pulled out of the aortic bifurcation and bilateral runoff with CO2 was obtained and till the images were suboptimal.  I then used a Omni Flush catheter across the aortic bifurcation, placed the catheter and the common femoral artery and obtain contrasted images of the right leg. Findings:  Aortogram: No visualized renal artery stenosis is identified.  The infrarenal abdominal aorta is patent without stenosis.  The bilateral common iliac arteries were patent without significant stenosis.  Right Lower Extremity: The right common femoral and profundofemoral artery were patent without stenosis.  The superficial femoral artery was patent there was a 70% stenosis which was focal in the proximal portion of the artery, with length about 1 cm.  The remaining portion of the superficial femoral and popliteal artery without stenosis.  The dominant runoff vessel appeared to be the posterior tibial.  The peroneal artery  appeared to be patent down to the ankle.  Anterior tibial appeared to be occluded at the ankle.  Left Lower Extremity: The left common femoral and profundofemoral artery were patent.  There was a mid superficial femoral artery stenosis.  Runoff was not able to be obtained secondary to patient movement and use of CO2 Intervention: At this point, the decision was made to proceed with intervention.  A 6 French 45 cm sheath was advanced over the aortic bifurcation and placed  into the right common femoral artery.  The patient was fully heparinized.  ACT measurements were 270.  I then used a 014 [place] core wire and easily advanced this across the lesion in the superficial femoral artery.  I elected to treat this with primary drug-coated balloon angioplasty given the short segment and the small caliber vessel.  The balloon was taken to 4 atm for 3 minutes.  Completion imaging showed resolution of the stenosis. Additional imaging of the leg showed either spasm or embolic occlusion of the posterior tibial and possibly the anterior tibial artery.  At this point I I placed a V 18 wire and was able to easily navigate this into the posterior tibial artery across the ankle.  I then performed balloon angioplasty of the posterior tibial artery using a 3 x 100 [place] balloon, taking each inflation to 4 atm for 2 minutes.  Once balloon inflation was completed, intra-arterial administration of 300 mcg of nitroglycerin was given.  Completion imaging was performed which showed widely patent posterior tibial artery across ankle.  I then elected to select the anterior tibial artery and was able to get a wire across the ankle however it did not go into the plantar arch.  I did perform balloon angioplasty of the anterior tibial artery with a 3 mm balloon for 2 minutes.  Intra-arterial administration of 300 mcg nitroglycerin was given.  This showed inline flow of the anterior tibial artery down to the ankle however it was occluded distal  to this.  I then performed a final injection of the leg with a catheter in the popliteal artery, and this showed that the posterior tibial artery had reoccluded.  This was then selected with the balloon catheter and wire and better imaging was obtained that showed a possible dissection from prior angioplasty.  I then reinserted the 3 mm balloon and perform balloon angioplasty at a low atmospheric pressure for 3 minutes of this area.  Completion imaging showed the vessel to be widely patent with resolution of the stenosis.  Catheters and wires were removed.  Patient taken the holding area for sheath pull once her coagulation profile corrects Impression:  #1  A total of 45 cc of contrast was utilized for the procedure  #2  Focal right superficial femoral artery stenosis, 70% was treated with drug-coated balloon angioplasty with residual lesion less than 10%.  #3  Upon completion of the intervention, runoff through the posterior tibial artery appeared to be suboptimal either through embolization or spasm.  I therefore proceeded to perform balloon angioplasty of the posterior tibial artery as well as anterior tibial artery.  After intervention these vessels were widely patent.  Theotis Burrow, M.D., FACS [facility] Office: [phone] Pager:  [phone]  US ARTERIAL ABI (SCREENING LOWER EXTREMITY)  Result Date: 12/06/2020 CLINICAL DATA:  74 year old female with dry gangrene of the right fifth digit EXAM: NONINVASIVE PHYSIOLOGIC VASCULAR STUDY OF BILATERAL LOWER EXTREMITIES TECHNIQUE: Evaluation of both lower extremities were performed at rest, including calculation of ankle-brachial indices with single level Doppler, pressure and pulse volume recording. COMPARISON:  None. FINDINGS: Right ABI:  0.8 Left ABI:  0.75 Right Lower Extremity: Biphasic arterial waveform in the posterior tibial artery. Abnormal monophasic arterial waveform in the dorsalis pedis artery. Left Lower  Extremity: Biphasic arterial waveforms in both the posterior tibial and dorsalis pedis arteries. 0.5-0.79 Moderate PAD IMPRESSION: 1. Abnormal resting right ankle-brachial index of 0.8 consistent with at least mild underlying peripheral arterial disease. 2.  Abnormal resting left ankle-brachial index of 0.75 consistent with at least moderate underlying peripheral arterial disease. Signed, Criselda Peaches, MD, [facility] Electronically Signed   By: Jacqulynn Cadet M.D.   On: 12/06/2020 16:31   DG Chest Port 1 View  Result Date: 12/05/2020 CLINICAL DATA:  Weakness. EXAM: PORTABLE CHEST 1 VIEW COMPARISON:  November 23, 2020. FINDINGS: Stable cardiomediastinal silhouette. No pneumothorax or pleural effusion is noted. Both lungs are clear. The visualized skeletal structures are unremarkable. IMPRESSION: No active disease. Aortic Atherosclerosis (ICD10-I70.0). Electronically Signed   By: Marijo Conception M.D.   On: 12/05/2020 20:27   DG Chest Portable 1 View  Result Date: 11/23/2020 CLINICAL DATA:  Weakness, shortness of breath EXAM: PORTABLE CHEST 1 VIEW COMPARISON:  10/21/2020 FINDINGS: Single frontal view of the chest demonstrates an unremarkable cardiac silhouette. No acute airspace disease, effusion, or pneumothorax. No acute bony abnormalities. IMPRESSION: 1. Stable exam, no acute process. Electronically Signed   By: Randa Ngo M.D.   On: 11/23/2020 19:34   DG Foot Complete Right  Result Date: 11/23/2020 CLINICAL DATA:  Fifth toe infection EXAM: RIGHT FOOT COMPLETE - 3+ VIEW COMPARISON:  11/18/2020 FINDINGS: No definitive fracture is seen. Stable calcifications are noted along the fifth toe laterally similar to that seen on the prior exam. No definitive erosive changes are seen. Tarsal degenerative changes and calcaneal spurring is seen. No soft tissue abnormality is noted. IMPRESSION: Stable calcifications along the fifth toe which  may be related to crystalline arthropathy as previously described. No definitive bony erosive changes are noted. Electronically Signed   By: Inez Catalina M.D.   On: 11/23/2020 20:30   DG Toe 5th Right  Result Date: 11/18/2020 CLINICAL DATA:  Right toe pain.  No known injury.  History of gout. EXAM: RIGHT FIFTH TOE COMPARISON:  None. FINDINGS: There is no evidence of fracture or dislocation. Joint spaces are preserved. Suspected erosion along the lateral cortex of the fifth toe middle phalanx. There is small mineralized densities within the soft tissues along the lateral aspect of the fifth toe near the proximal interphalangeal joint. IMPRESSION: Suspected erosion along the lateral cortex of the fifth toe middle phalanx. Small mineralized densities within the soft tissues along the lateral aspect of the fifth toe near the proximal interphalangeal joint. 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 Electronically Signed   By: Davina Poke D.O.   On: 11/18/2020 09:45       Subjective: Patient seen and examined.  Tells me that she feels good and wishes to go home today.  Tells me that she able to ambulate without any issues.  She has home health nurse-who is going to help her with wound dressing.  Discharge Exam: Vitals:   12/15/20 0452 12/15/20 0931  BP: (!) 143/56 (!) 158/62  Pulse: 63 64  Resp: 16 18  Temp: 98.2 F (36.8 C) 97.8 F (36.6 C)  SpO2: 96% 100%   Vitals:   12/14/20 2008 12/15/20 0009 12/15/20 0452 12/15/20 0931  BP: (!) 166/66 (!) 141/61 (!) 143/56 (!) 158/62  Pulse: 76 68 63 64  Resp: 20 15 16 18   Temp: 97.8 F (36.6 C) 97.8 F (36.6 C) 98.2 F (36.8 C) 97.8 F (36.6 C)  TempSrc: Oral Oral Oral Oral  SpO2: 98% 97% 96% 100%  Weight:   70.1 kg   Height:        General: Pt is alert, awake, not in acute  distress Cardiovascular: RRR, S1/S2 +, no rubs, no gallops Respiratory: CTA bilaterally, no wheezing, no  rhonchi Abdominal: Soft, NT, ND, bowel sounds + Extremities: no edema, no cyanosis.  Right foot dressing dry and intact.    The results of significant diagnostics from this hospitalization (including imaging, microbiology, ancillary and laboratory) are listed below for reference.     Microbiology: Recent Results (from the past 240 hour(s))  Resp Panel by RT-PCR (Flu A&B, Covid) Nasopharyngeal Swab     Status: None   Collection Time: 12/05/20  8:11 PM   Specimen: Nasopharyngeal Swab; Nasopharyngeal(NP) swabs in vial transport medium  Result Value Ref Range Status   SARS Coronavirus 2 by RT PCR NEGATIVE NEGATIVE Final    Comment: (NOTE) SARS-CoV-2 target nucleic acids are NOT DETECTED.  The SARS-CoV-2 RNA is generally detectable in upper respiratory specimens during the acute phase of infection. The lowest concentration of SARS-CoV-2 viral copies this assay can detect is 138 copies/mL. A negative result does not preclude SARS-Cov-2 infection and should not be used as the sole basis for treatment or other patient management decisions. A negative result may occur with  improper specimen collection/handling, submission of specimen other than nasopharyngeal swab, presence of viral mutation(s) within the areas targeted by this assay, and inadequate number of viral copies(<138 copies/mL). A negative result must be combined with clinical observations, patient history, and epidemiological information. The expected result is Negative.  Fact Sheet for Patients:  EntrepreneurPulse.com.au  Fact Sheet for Healthcare Providers:  IncredibleEmployment.be  This test is no t yet approved or cleared by the [place] FDA and  has been authorized for detection and/or diagnosis of SARS-CoV-2 by FDA under an Emergency Use Authorization (EUA). This EUA will remain  in effect (meaning this test can be used) for the duration of the COVID-19 declaration under  Section 564(b)(1) of the Act, 21 U.S.C.section 360bbb-3(b)(1), unless the authorization is terminated  or revoked sooner.       Influenza A by PCR NEGATIVE NEGATIVE Final   Influenza B by PCR NEGATIVE NEGATIVE Final    Comment: (NOTE) The Xpert Xpress SARS-CoV-2/FLU/RSV plus assay is intended as an aid in the diagnosis of influenza from Nasopharyngeal swab specimens and should not be used as a sole basis for treatment. Nasal washings and aspirates are unacceptable for Xpert Xpress SARS-CoV-2/FLU/RSV testing.  Fact Sheet for Patients: EntrepreneurPulse.com.au  Fact Sheet for Healthcare Providers: IncredibleEmployment.be  This test is not yet approved or cleared by the [place] FDA and has been authorized for detection and/or diagnosis of SARS-CoV-2 by FDA under an Emergency Use Authorization (EUA). This EUA will remain in effect (meaning this test can be used) for the duration of the COVID-19 declaration under Section 564(b)(1) of the Act, 21 U.S.C. section 360bbb-3(b)(1), unless the authorization is terminated or revoked.  Performed at [facility], [address], [place], [place] [address]      Labs: BNP (last 3 results) Recent Labs    06/10/20 0208 10/20/20 1853  BNP 198.0* 333.5*   Basic Metabolic Panel: Recent Labs  Lab 12/09/20 0020 12/10/20 0414 12/11/20 0157 12/12/20 0039 12/13/20 0126 12/14/20 0148 12/15/20 0155  NA 135   < > 138 138 136 137 133*  K 3.1*   < > 3.1* 3.2* 3.6 3.6 4.3  CL 97*   < > 97* 97* 98 101 98  CO2 28   < > 27 27 27 25 23   GLUCOSE 114*   < > 108* 109* 106* 134* 145*  BUN 23   < > 18 23 30* 28* 31*  CREATININE 2.81*   < > 2.84* 2.72* 2.73* 2.64* 2.45*  CALCIUM 8.6*   < > 8.8* 8.8* 8.8* 8.4* 8.6*  PHOS 3.3  --   --   --   --  3.4  --    < > = values in this interval not displayed.   Liver Function Tests: Recent Labs  Lab 12/09/20 0020 12/14/20 0148  ALBUMIN 2.8* 2.7*   No results for  input(s): LIPASE, AMYLASE in the last 168 hours. No results for input(s): AMMONIA in the last 168 hours. CBC: Recent Labs  Lab 12/10/20 0414 12/12/20 1242 12/13/20 0507 12/14/20 0148 12/15/20 0155  WBC 5.8 7.3 6.6 8.6 7.1  NEUTROABS 2.7 4.0  --  5.8  --   HGB 9.0* 9.5* 9.0* 9.1* 9.1*  HCT 27.7* 30.8* 29.8* 29.7* 29.7*  MCV 85.5 86.8 89.2 88.1 87.1  PLT 335 421* 407* 363 400   Cardiac Enzymes: No results for input(s): CKTOTAL, CKMB, CKMBINDEX, TROPONINI in the last 168 hours. BNP: Invalid input(s): POCBNP CBG: No results for input(s): GLUCAP in the last 168 hours. D-Dimer No results for input(s): DDIMER in the last 72 hours. Hgb A1c No results for input(s): HGBA1C in the last 72 hours. Lipid Profile No results for input(s): CHOL, HDL, LDLCALC, TRIG, CHOLHDL, LDLDIRECT in the last 72 hours. Thyroid function studies No results for input(s): TSH, T4TOTAL, T3FREE, THYROIDAB in the last 72 hours.  Invalid input(s): FREET3 Anemia work up No results for input(s): VITAMINB12, FOLATE, FERRITIN, TIBC, IRON, RETICCTPCT in the last 72 hours. Urinalysis    Component Value Date/Time   COLORURINE STRAW (A) 11/23/2020 2015   APPEARANCEUR CLEAR 11/23/2020 2015   LABSPEC 1.008 11/23/2020 2015   PHURINE 6.0 11/23/2020 2015   GLUCOSEU NEGATIVE 11/23/2020 2015   HGBUR SMALL (A) 11/23/2020 2015   BILIRUBINUR NEGATIVE 11/23/2020 2015   KETONESUR NEGATIVE 11/23/2020 2015   PROTEINUR NEGATIVE 11/23/2020 2015   NITRITE NEGATIVE 11/23/2020 2015   LEUKOCYTESUR NEGATIVE 11/23/2020 2015   Sepsis Labs Invalid input(s): PROCALCITONIN,  WBC,  LACTICIDVEN Microbiology Recent Results (from the past 240 hour(s))  Resp Panel by RT-PCR (Flu A&B, Covid) Nasopharyngeal Swab     Status: None   Collection Time: 12/05/20  8:11 PM   Specimen: Nasopharyngeal Swab; Nasopharyngeal(NP) swabs in vial transport medium  Result Value Ref Range Status   SARS Coronavirus 2 by RT PCR NEGATIVE NEGATIVE Final     Comment: (NOTE) SARS-CoV-2 target nucleic acids are NOT DETECTED.  The SARS-CoV-2 RNA is generally detectable in upper respiratory specimens during the acute phase of infection. The lowest concentration of SARS-CoV-2 viral copies this assay can detect is 138 copies/mL. A negative result does not preclude SARS-Cov-2 infection and should not be used as the sole basis for treatment or other patient management decisions. A negative result may occur with  improper specimen collection/handling, submission of specimen other than nasopharyngeal swab, presence of viral mutation(s) within the areas targeted by this assay, and inadequate number of viral copies(<138 copies/mL). A negative result must be combined with clinical observations, patient history, and epidemiological information. The expected result is Negative.  Fact Sheet for Patients:  EntrepreneurPulse.com.au  Fact Sheet for Healthcare Providers:  IncredibleEmployment.be  This test is no t yet approved or cleared by the [place] FDA and  has been authorized for detection and/or diagnosis of SARS-CoV-2 by FDA under an Emergency Use Authorization (EUA). This EUA will remain  in  effect (meaning this test can be used) for the duration of the COVID-19 declaration under Section 564(b)(1) of the Act, 21 U.S.C.section 360bbb-3(b)(1), unless the authorization is terminated  or revoked sooner.       Influenza A by PCR NEGATIVE NEGATIVE Final   Influenza B by PCR NEGATIVE NEGATIVE Final    Comment: (NOTE) The Xpert Xpress SARS-CoV-2/FLU/RSV plus assay is intended as an aid in the diagnosis of influenza from Nasopharyngeal swab specimens and should not be used as a sole basis for treatment. Nasal washings and aspirates are unacceptable for Xpert Xpress SARS-CoV-2/FLU/RSV testing.  Fact Sheet for Patients: EntrepreneurPulse.com.au  Fact Sheet for Healthcare  Providers: IncredibleEmployment.be  This test is not yet approved or cleared by the [place] FDA and has been authorized for detection and/or diagnosis of SARS-CoV-2 by FDA under an Emergency Use Authorization (EUA). This EUA will remain in effect (meaning this test can be used) for the duration of the COVID-19 declaration under Section 564(b)(1) of the Act, 21 U.S.C. section 360bbb-3(b)(1), unless the authorization is terminated or revoked.  Performed at [facility], [address] [place], [place] [address]      Time coordinating discharge: Over 30 minutes  SIGNED:   Mckinley Jewel, MD  [facility] 12/15/2020, 11:07 AM Pager   If 7PM-7AM, please contact night-coverage [email or web address]

## 2020-12-15 NOTE — Progress Notes (Signed)
Discharge instructions (including medications) discussed with and copy provided to patient/caregiver 

## 2020-12-19 ENCOUNTER — Telehealth: Payer: Self-pay

## 2020-12-19 NOTE — Telephone Encounter (Signed)
Spoke with Rodman Key from [facility] calling on octre[facility] RX - not prescribed by our office. Gave him provider name and office number.

## 2020-12-20 ENCOUNTER — Telehealth: Payer: Self-pay | Admitting: Internal Medicine

## 2020-12-20 ENCOUNTER — Telehealth: Payer: Self-pay | Admitting: Gastroenterology

## 2020-12-20 ENCOUNTER — Telehealth: Payer: Self-pay | Admitting: Cardiology

## 2020-12-20 NOTE — Telephone Encounter (Signed)
New message      *STAT* If patient is at the pharmacy, call can be transferred to refill team.   1. Which medications need to be refilled? (please list name of each medication and dose if known) octreotide (SANDOSTATIN) 100 MCG/ML SOLN injection  2. Which pharmacy/location (including street and city if local pharmacy) is medication to be sent to? [phone]   3. Do they need a 30 day or 90 day supply?  Medication that was called in without dosage , needs medication called in she has been out for 2 doses

## 2020-12-20 NOTE — Telephone Encounter (Addendum)
THis is a GI medication, needs to be filled by dr.carver and Rourk, pt phone number is not working.  Patient notified and she has called GI

## 2020-12-20 NOTE — Telephone Encounter (Signed)
Opened in error

## 2020-12-20 NOTE — Telephone Encounter (Signed)
ERROR

## 2020-12-20 NOTE — Telephone Encounter (Signed)
error 

## 2020-12-21 ENCOUNTER — Ambulatory Visit (INDEPENDENT_AMBULATORY_CARE_PROVIDER_SITE_OTHER): Payer: Medicare HMO | Admitting: Internal Medicine

## 2020-12-21 ENCOUNTER — Telehealth: Payer: Self-pay | Admitting: Internal Medicine

## 2020-12-21 ENCOUNTER — Other Ambulatory Visit: Payer: Self-pay

## 2020-12-21 ENCOUNTER — Other Ambulatory Visit: Payer: Self-pay | Admitting: *Deleted

## 2020-12-21 ENCOUNTER — Encounter: Payer: Self-pay | Admitting: Internal Medicine

## 2020-12-21 VITALS — BP 140/62 | HR 62 | Ht 62.0 in | Wt 154.4 lb

## 2020-12-21 DIAGNOSIS — I480 Paroxysmal atrial fibrillation: Secondary | ICD-10-CM | POA: Diagnosis not present

## 2020-12-21 DIAGNOSIS — I10 Essential (primary) hypertension: Secondary | ICD-10-CM | POA: Diagnosis not present

## 2020-12-21 DIAGNOSIS — I739 Peripheral vascular disease, unspecified: Secondary | ICD-10-CM

## 2020-12-21 MED ORDER — OCTREOTIDE ACETATE 100 MCG/ML IJ SOLN: 100.0000 ug | Freq: Two times a day (BID) | INTRAMUSCULAR | 2 refills | Status: DC

## 2020-12-21 NOTE — Telephone Encounter (Signed)
See other note

## 2020-12-21 NOTE — Patient Instructions (Signed)
Medication Instructions:  Your physician recommends that you continue on your current medications as directed. Please refer to the Current Medication list given to you today.  *If you need a refill on your cardiac medications before your next appointment, please call your pharmacy*   Lab Work: NONE   If you have labs (blood work) drawn today and your tests are completely normal, you will receive your results only by: MyChart Message (if you have MyChart) OR A paper copy in the mail If you have any lab test that is abnormal or we need to change your treatment, we will call you to review the results.   Testing/Procedures: NONE    Follow-Up: At [facility], you and your health needs are our priority.  As part of our continuing mission to provide you with exceptional heart care, we have created designated Provider Care Teams.  These Care Teams include your primary Cardiologist (physician) and Advanced Practice Providers (APPs -  Physician Assistants and Nurse Practitioners) who all work together to provide you with the care you need, when you need it.  We recommend signing up for the patient portal called "MyChart".  Sign up information is provided on this After Visit Summary.  MyChart is used to connect with patients for Virtual Visits (Telemedicine).  Patients are able to view lab/test results, encounter notes, upcoming appointments, etc.  Non-urgent messages can be sent to your provider as well.   To learn more about what you can do with MyChart, go to [email or web address].    Your next appointment:   6 month(s)  The format for your next appointment:   In Person  Provider:   Gregg Taylor, MD   Other Instructions Thank you for choosing [facility]!    

## 2020-12-21 NOTE — Telephone Encounter (Signed)
PATIENT CALLED ASKING ABOUT HER SPECIALTY ORDER MEDICATION, SHE HAS BEEN OUT AND MISSED 4 DOSES

## 2020-12-21 NOTE — Telephone Encounter (Signed)
Script sent to pharmacy. Please make sure she has follow up visit with either myself or one of the APPs. Thanks!

## 2020-12-21 NOTE — Telephone Encounter (Signed)
Phoned and spoke with the pt advised Dr. Abbey Chatters has [facility] [place] [place] (there was confusion because he had not written this for the pt before and thinks it was written when the pt was in the hospital. She understood and was glad it was filled.

## 2020-12-21 NOTE — Progress Notes (Signed)
HPI [name] [name] returns today for followup of her atrial fib and HTN. She is a pleasant 74 yo woman with PAF, who I saw a couple of months ago. She was started on flecainide 75 mg twice daily. Her symptoms of atrial fib have essentially resolved. She denies chest pain or sob. She has an exercise test pending. She had some GI bleeding several weeks ago.  Allergies  Allergen Reactions  . Acetaminophen-Codeine Hives  . Amlodipine     Felt she retained fluid in her chest   . Amoxicillin Hives    Did it involve swelling of the face/tongue/throat, SOB, or low BP? No Did it involve sudden or severe rash/hives, skin peeling, or any reaction on the inside of your mouth or nose? No Did you need to seek medical attention at a hospital or doctor's office? No When did it last happen?10 + years If all above answers are "NO", may proceed with cephalosporin use.   Marland Kitchen Doxycycline     " sick and weak"  . Acetaminophen-Codeine Rash  . Allopurinol Hives and Rash  . Tramadol Hives and Rash     Current Outpatient Medications  Medication Sig Dispense Refill  . acetaminophen (TYLENOL) 500 MG tablet Take 1,000 mg by mouth every 6 (six) hours as needed for moderate pain.    Marland Kitchen albuterol (VENTOLIN HFA) 108 (90 Base) MCG/ACT inhaler Inhale 1-2 puffs into the lungs every 6 (six) hours as needed for wheezing or shortness of breath.     Marland Kitchen apixaban (ELIQUIS) 5 MG TABS tablet Take 1 tablet (5 mg total) by mouth 2 (two) times daily. 180 tablet 3  . aspirin EC 81 MG EC tablet Take 1 tablet (81 mg total) by mouth daily. Swallow whole. 30 tablet 11  . cetirizine (ZYRTEC) 10 MG tablet Take 10 mg by mouth daily as needed for allergies.    . Cholecalciferol (VITAMIN D-3) 25 MCG (1000 UT) CAPS Take 1 capsule by mouth daily.    . cyclobenzaprine (FLEXERIL) 5 MG tablet Take 5 mg by mouth 3 (three) times daily as needed for muscle spasms.    . diclofenac Sodium (VOLTAREN) 1 % GEL Apply 1 application topically 4  (four) times daily as needed (pain).    Marland Kitchen ELDERBERRY PO Take 2 capsules by mouth daily.     . feeding supplement (ENSURE ENLIVE / ENSURE PLUS) LIQD Take 237 mLs by mouth 2 (two) times daily between meals. 237 mL 12  . ferrous sulfate 325 (65 FE) MG tablet Take 325 mg by mouth daily with breakfast.    . flecainide (TAMBOCOR) 50 MG tablet Take 1 tablet (50 mg total) by mouth 2 (two) times daily. 60 tablet 3  . fluticasone (FLONASE) 50 MCG/ACT nasal spray Place 1 spray into both nostrils daily as needed for allergies or rhinitis.    . Fluticasone-Salmeterol (ADVAIR) 500-50 MCG/DOSE AEPB Inhale 1 puff into the lungs every 12 (twelve) hours.    . furosemide (LASIX) 40 MG tablet Take 1 tablet (40 mg total) by mouth daily. 90 tablet 3  . gabapentin (NEURONTIN) 300 MG capsule Take 300 mg by mouth 3 (three) times daily.    Marland Kitchen losartan (COZAAR) 25 MG tablet Take 0.5 tablets (12.5 mg total) by mouth daily.    Marland Kitchen lubiprostone (AMITIZA) 24 MCG capsule Take 1 capsule (24 mcg total) by mouth in the morning and at bedtime. 180 capsule 3  . meclizine (ANTIVERT) 25 MG tablet Take 25 mg by mouth 3 (  three) times daily as needed for dizziness.    . Nutritional Supplements (,FEEDING SUPPLEMENT, PROSOURCE PLUS) liquid Take 30 mLs by mouth 2 (two) times daily between meals.    Marland Kitchen octreotide (SANDOSTATIN) 100 MCG/ML SOLN injection Inject 1 mL (100 mcg total) into the skin every 12 (twelve) hours. 60 mL 2  . oxycodone (OXY-IR) 5 MG capsule Take 2 capsules (10 mg total) by mouth every 6 (six) hours as needed for pain. 15 capsule 0  . pantoprazole (PROTONIX) 40 MG tablet Take 1 tablet (40 mg total) by mouth 2 (two) times daily before a meal. 90 tablet 0  . potassium chloride SA (KLOR-CON) 20 MEQ tablet Take 20 mEq by mouth in the morning, at noon, and at bedtime.    . rosuvastatin (CRESTOR) 10 MG tablet Take 1 tablet (10 mg total) by mouth daily. 30 tablet 0  . vitamin C (ASCORBIC ACID) 250 MG tablet Take 500 mg by mouth daily.      No current facility-administered medications for this visit.     Past Medical History:  Diagnosis Date  . Constipation 07/17/2017  . GERD (gastroesophageal reflux disease)   . Gout   . Grade II diastolic dysfunction 08/26/2354  . Hyperlipidemia   . Hypertension   . Hypokalemia   . OSA (obstructive sleep apnea) 08/09/2020  . PAF (paroxysmal atrial fibrillation) ([place])   . PAF (paroxysmal atrial fibrillation) ([place])   . PUD (peptic ulcer disease)   . Syncope    Neurocardiogenic    ROS:   All systems reviewed and negative except as noted in the HPI.   Past Surgical History:  Procedure Laterality Date  . ABDOMINAL AORTOGRAM W/LOWER EXTREMITY Right 12/13/2020   Procedure: ABDOMINAL AORTOGRAM W/LOWER EXTREMITY;  Surgeon: Serafina Mitchell, MD;  Location: [place] [facility];  Service: Cardiovascular;  Laterality: Right;  . ABDOMINAL HYSTERECTOMY    . AMPUTATION Right 12/14/2020   Procedure: Right fifth toe amputation;  Surgeon: Rosetta Posner, MD;  Location: [facility];  Service: Vascular;  Laterality: Right;  . CATARACT EXTRACTION Bilateral   . COLONOSCOPY N/A 10/07/2014   Dr. Oneida Alar: redundant left colon, moderate sized external hemorrhoids   . COLONOSCOPY N/A 03/11/2020   Procedure: COLONOSCOPY;  Surgeon: Danie Binder, MD;  [facility];  Service: Endoscopy;  Laterality: N/A;  9:30am w/ overtube  . ENTEROSCOPY N/A 11/27/2020   Procedure: ENTEROSCOPY;  Surgeon: Harvel Quale, MD;  [facility];  Service: Gastroenterology;  Laterality: N/A;  . ESOPHAGOGASTRODUODENOSCOPY (EGD) WITH PROPOFOL N/A 11/25/2020   Procedure: ESOPHAGOGASTRODUODENOSCOPY (EGD) WITH PROPOFOL;  Surgeon: Daneil Dolin, MD;  [facility];  Service: Endoscopy;  Laterality: N/A;  . GIVENS CAPSULE STUDY N/A 11/25/2020   Procedure: GIVENS CAPSULE STUDY;  Surgeon: Daneil Dolin, MD;  [facility];  Service: Endoscopy;  Laterality: N/A;  possible capsule if IDA  unexplained by EGD findings.   Marland Kitchen HOT HEMOSTASIS  11/27/2020   Procedure: HOT HEMOSTASIS (ARGON PLASMA COAGULATION/BICAP);  Surgeon: Montez Morita, Quillian Quince, MD;  [facility];  Service: Gastroenterology;;  . LIPOMA RESECTION    . PERIPHERAL VASCULAR INTERVENTION Right 12/13/2020   Procedure: PERIPHERAL VASCULAR INTERVENTION;  Surgeon: Serafina Mitchell, MD;  Location: [place] [facility];  Service: Cardiovascular;  Laterality: Right;  SFA/PT/AT  . POLYPECTOMY  03/11/2020   Procedure: POLYPECTOMY;  Surgeon: Danie Binder, MD;  [facility];  Service: Endoscopy;;  cecal, transverse, descending, sigmoid  Family History  Problem Relation Age of Onset  . Stroke Mother   . Dementia Father   . Cancer - Other Sister   . Atrial fibrillation Sister   . Heart failure Sister   . Colon cancer Maternal Grandmother        diagnosed in her 67s     Social History   Socioeconomic History  . Marital status: Single    Spouse name: Not on file  . Number of children: Not on file  . Years of education: Not on file  . Highest education level: Not on file  Occupational History  . Occupation: Retired  Tobacco Use  . Smoking status: Former Smoker    Packs/day: 1.00    Years: 20.00    Pack years: 20.00    Types: Cigarettes    Start date: 03/16/1964    Quit date: 10/07/1997    Years since quitting: 23.2  . Smokeless tobacco: Never Used  Vaping Use  . Vaping Use: Never used  Substance and Sexual Activity  . Alcohol use: No    Alcohol/week: 0.0 standard drinks  . Drug use: No  . Sexual activity: Yes    Partners: Male  Other Topics Concern  . Not on file  Social History Narrative  . Not on file   Social Determinants of Health   Financial Resource Strain: Not on file  Food Insecurity: Not on file  Transportation Needs: Not on file  Physical Activity: Not on file  Stress: Not on file  Social Connections: Not on file  Intimate Partner Violence: Not on file      BP 140/62   Pulse 62   Ht 5\' 2"  (1.575 m)   Wt 154 lb 6.4 oz (70 kg)   SpO2 98%   BMI 28.24 kg/m   Physical Exam:  Well appearing NAD HEENT: Unremarkable Neck:  No JVD, no thyromegally Lymphatics:  No adenopathy Back:  No CVA tenderness Lungs:  Clear HEART:  Regular rate rhythm, no murmurs, no rubs, no clicks Abd:  soft, positive bowel sounds, no organomegally, no rebound, no guarding Ext:  2 plus pulses, no edema, no cyanosis, no clubbing Skin:  No rashes no nodules Neuro:  CN II through XII intact, motor grossly intact  Assess/Plan: 1. Atrial fib - her symptoms are much improved on flecainide. She will continue her current meds. An exercise test is pending. 2. HTN - her bp is slightly up. She is encouraged to avoid salty foods.  3. Obesity - she is a bit overweight and I encouraged her to lose 15 lbs.  4. GI bleeding - she was prescribed octreotide but has been unable to get it. She has not had more bleeding and is on eliquis. She had a clot in her toe and had it amputated.  Carleene Overlie Emberlie Gotcher,MD

## 2020-12-22 ENCOUNTER — Telehealth: Payer: Self-pay

## 2020-12-22 NOTE — Telephone Encounter (Signed)
Dr. Abbey Chatters, I received a call from [facility] and I spoke with Marquette Old., CSR was advised the pt was denied for Octreotide (Sandostatin 100 mcg) this morning. They need an alternative for the pt to take today. She also states there will be no deliveries Saturday, December 24, 2020. Whatever you call in may all have to be gotten from her local pharmacy so she can have medication through the weekend.

## 2020-12-27 ENCOUNTER — Other Ambulatory Visit: Payer: Self-pay | Admitting: Internal Medicine

## 2020-12-28 ENCOUNTER — Other Ambulatory Visit: Payer: Self-pay | Admitting: Internal Medicine

## 2020-12-30 ENCOUNTER — Ambulatory Visit: Payer: Medicare HMO | Admitting: Student

## 2021-01-02 ENCOUNTER — Ambulatory Visit (INDEPENDENT_AMBULATORY_CARE_PROVIDER_SITE_OTHER): Payer: Self-pay | Admitting: Vascular Surgery

## 2021-01-02 ENCOUNTER — Encounter: Payer: Self-pay | Admitting: Vascular Surgery

## 2021-01-02 ENCOUNTER — Ambulatory Visit (INDEPENDENT_AMBULATORY_CARE_PROVIDER_SITE_OTHER): Payer: Medicare HMO

## 2021-01-02 ENCOUNTER — Other Ambulatory Visit: Payer: Self-pay

## 2021-01-02 ENCOUNTER — Ambulatory Visit: Payer: Medicare HMO

## 2021-01-02 ENCOUNTER — Telehealth: Payer: Self-pay | Admitting: Family Medicine

## 2021-01-02 VITALS — BP 133/61 | HR 83 | Temp 98.4°F | Resp 18 | Ht 62.0 in | Wt 154.0 lb

## 2021-01-02 DIAGNOSIS — I739 Peripheral vascular disease, unspecified: Secondary | ICD-10-CM

## 2021-01-02 DIAGNOSIS — Z89421 Acquired absence of other right toe(s): Secondary | ICD-10-CM

## 2021-01-02 NOTE — Progress Notes (Signed)
[facility] [place]  Patient name: [name] [name] MRN: [id number] DOB: May 06, 1946 Sex: female  REASON FOR VISIT: Follow-up of right fifth toe amputation  HPI: [name] [name] is a 75 y.o. female here today for follow-up.  I presented with nonhealing ulcer on her right fifth toe.  On 12/13/2020 she was taken to the angios suite by Dr. Trula Slade where she underwent drug-coated balloon angioplasty of her right superficial femoral artery and also angioplasty of her anterior tibial, posterior tibial arteries.  The following day she went to the operating room by myself and underwent a right fifth toe amputation with primary closure.  The metatarsal head was not involved.  She is here today for follow-up.  Current Outpatient Medications  Medication Sig Dispense Refill  . acetaminophen (TYLENOL) 500 MG tablet Take 1,000 mg by mouth every 6 (six) hours as needed for moderate pain.    Marland Kitchen albuterol (VENTOLIN HFA) 108 (90 Base) MCG/ACT inhaler Inhale 1-2 puffs into the lungs every 6 (six) hours as needed for wheezing or shortness of breath.     Marland Kitchen apixaban (ELIQUIS) 5 MG TABS tablet Take 1 tablet (5 mg total) by mouth 2 (two) times daily. 180 tablet 3  . aspirin EC 81 MG EC tablet Take 1 tablet (81 mg total) by mouth daily. Swallow whole. 30 tablet 11  . cetirizine (ZYRTEC) 10 MG tablet Take 10 mg by mouth daily as needed for allergies.    . Cholecalciferol (VITAMIN D-3) 25 MCG (1000 UT) CAPS Take 1 capsule by mouth daily.    . cyclobenzaprine (FLEXERIL) 5 MG tablet Take 5 mg by mouth 3 (three) times daily as needed for muscle spasms.    . diclofenac Sodium (VOLTAREN) 1 % GEL Apply 1 application topically 4 (four) times daily as needed (pain).    Marland Kitchen ELDERBERRY PO Take 2 capsules by mouth daily.     . feeding supplement (ENSURE ENLIVE / ENSURE PLUS) LIQD Take 237 mLs by mouth 2 (two) times daily between meals. 237 mL 12  . ferrous sulfate 325 (65 FE) MG  tablet Take 325 mg by mouth daily with breakfast.    . fluticasone (FLONASE) 50 MCG/ACT nasal spray Place 1 spray into both nostrils daily as needed for allergies or rhinitis.    . Fluticasone-Salmeterol (ADVAIR) 500-50 MCG/DOSE AEPB Inhale 1 puff into the lungs every 12 (twelve) hours.    . furosemide (LASIX) 40 MG tablet Take 1 tablet (40 mg total) by mouth daily. 90 tablet 3  . gabapentin (NEURONTIN) 300 MG capsule Take 300 mg by mouth 3 (three) times daily.    Marland Kitchen losartan (COZAAR) 25 MG tablet Take 0.5 tablets (12.5 mg total) by mouth daily.    Marland Kitchen lubiprostone (AMITIZA) 24 MCG capsule Take 1 capsule (24 mcg total) by mouth in the morning and at bedtime. 180 capsule 3  . meclizine (ANTIVERT) 25 MG tablet Take 25 mg by mouth 3 (three) times daily as needed for dizziness.    . Nutritional Supplements (,FEEDING SUPPLEMENT, PROSOURCE PLUS) liquid Take 30 mLs by mouth 2 (two) times daily between meals.    Marland Kitchen octreotide (SANDOSTATIN) 100 MCG/ML SOLN injection Inject 1 mL (100 mcg total) into the skin every 12 (twelve) hours. 60 mL 2  . oxycodone (OXY-IR) 5 MG capsule Take 2 capsules (10 mg total) by mouth every 6 (six) hours as needed for pain. 15 capsule 0  . pantoprazole (PROTONIX) 40 MG tablet TAKE 1 TABLET (40 MG TOTAL) BY  MOUTH 2 (TWO) TIMES DAILY BEFORE A MEAL. 90 tablet 0  . potassium chloride SA (KLOR-CON) 20 MEQ tablet Take 20 mEq by mouth in the morning, at noon, and at bedtime.    . rosuvastatin (CRESTOR) 10 MG tablet TAKE 1 TABLET BY MOUTH EVERY DAY 30 tablet 0  . vitamin C (ASCORBIC ACID) 250 MG tablet Take 500 mg by mouth daily.    . flecainide (TAMBOCOR) 50 MG tablet Take 1 tablet (50 mg total) by mouth 2 (two) times daily. 60 tablet 3   No current facility-administered medications for this visit.     PHYSICAL EXAM: Vitals:   01/02/21 1458  BP: 133/61  Pulse: 83  Resp: 18  Temp: 98.4 F (36.9 C)  TempSrc: Other (Comment)  SpO2: 92%  Weight: 154 lb (69.9 kg)  Height: 5\' 2"   (1.575 m)    GENERAL: The patient is a well-nourished female, in no acute distress. The vital signs are documented above. Excellent Barbarajean Kinzler healing of her fifth toe amputation.  The mattress sutures were removed.  She does have several sutures at the distal tip which are involved with eschar and these were left in place for later removal.   Noninvasive studies in our office today reveal Henkelman index of 0.56 on the right and 0.54 on the left. Duplex of the area of the superficial angioplasty show some dissection present with elevated velocities through this area.  She does have excellent flow by hand-held Doppler in her right and left foot. MEDICAL ISSUES: Stable overall.  We will see her again in 3 weeks for further follow-up of her foot.  She appears to have very good healing.  There is some flow disturbance through the area of superficial femoral angioplasty.  Would not recommend any further intervention of this unless she has difficulty with toe healing.   Rosetta Posner, MD FACS [facility] Tel [phone]

## 2021-01-03 ENCOUNTER — Encounter (HOSPITAL_COMMUNITY): Payer: Medicare HMO

## 2021-01-03 ENCOUNTER — Encounter: Payer: Medicare HMO | Admitting: Vascular Surgery

## 2021-01-03 ENCOUNTER — Ambulatory Visit: Payer: Medicare HMO | Admitting: Family Medicine

## 2021-01-04 ENCOUNTER — Telehealth: Payer: Self-pay

## 2021-01-04 ENCOUNTER — Ambulatory Visit: Payer: Medicare HMO | Admitting: Family Medicine

## 2021-01-04 MED ORDER — OCTREOTIDE ACETATE 100 MCG/ML IJ SOLN
100.0000 ug | Freq: Two times a day (BID) | INTRAMUSCULAR | 2 refills | Status: DC
Start: 2021-01-04 — End: 2021-01-05

## 2021-01-04 NOTE — Telephone Encounter (Signed)
I returned the pt's phone call regarding her medication. I was advised by her she hasnt had her Octreotide (Sandostatin) 194mcg/ml soln injection since sometime in December. I advised her Dr. [facility] Chatters [facility] [place] as instructed by the pt. Pt advises she knows this but they would not give it to her stating her meds have to go through her [facility]. I asked the pt why didn't she call us to let us know this but she said she just got tired of fighting with the people. Dr. Abbey Chatters re-did the script to go to the [facility] about 4:45pm and I called the pt to advise her of this and to be on the lookout for it.

## 2021-01-04 NOTE — Telephone Encounter (Signed)
Pt called in and stated that she still hasn't received her medication.  She wants to know if we can assist her with this.  Spoke with Dena.  She informed me to let pt know that if her pharmacy could fax over prior authorization information that she would be more than glad to assist with this.  Pt voiced understanding.

## 2021-01-04 NOTE — Telephone Encounter (Signed)
Already addressed and Rx sent [facility] and the pt is aware. This is in the telephone note

## 2021-01-05 ENCOUNTER — Other Ambulatory Visit: Payer: Self-pay

## 2021-01-05 ENCOUNTER — Telehealth: Payer: Self-pay

## 2021-01-05 ENCOUNTER — Encounter (HOSPITAL_COMMUNITY): Payer: Self-pay

## 2021-01-05 ENCOUNTER — Telehealth: Payer: Self-pay | Admitting: Neurology

## 2021-01-05 ENCOUNTER — Telehealth: Payer: Self-pay | Admitting: Internal Medicine

## 2021-01-05 ENCOUNTER — Encounter (HOSPITAL_COMMUNITY)
Admission: RE | Admit: 2021-01-05 | Discharge: 2021-01-05 | Disposition: A | Discharge status: Home / Self Care | Payer: Medicare HMO | Source: Ambulatory Visit | Attending: Nephrology | Admitting: Nephrology

## 2021-01-05 DIAGNOSIS — D638 Anemia in other chronic diseases classified elsewhere: Secondary | ICD-10-CM | POA: Insufficient documentation

## 2021-01-05 DIAGNOSIS — E211 Secondary hyperparathyroidism, not elsewhere classified: Secondary | ICD-10-CM | POA: Diagnosis not present

## 2021-01-05 DIAGNOSIS — E559 Vitamin D deficiency, unspecified: Secondary | ICD-10-CM | POA: Diagnosis not present

## 2021-01-05 DIAGNOSIS — N184 Chronic kidney disease, stage 4 (severe): Secondary | ICD-10-CM | POA: Insufficient documentation

## 2021-01-05 LAB — HEMOGLOBIN AND HEMATOCRIT, BLOOD
HCT: 19.3 % — ABNORMAL LOW (ref 36.0–46.0)
Hemoglobin: 5.7 g/dL — CL (ref 12.0–15.0)

## 2021-01-05 LAB — PREPARE RBC (CROSSMATCH)

## 2021-01-05 MED ORDER — OCTREOTIDE ACETATE 100 MCG/ML IJ SOLN: 100.0000 ug | Freq: Two times a day (BID) | INTRAMUSCULAR | 2 refills | Status: DC

## 2021-01-05 MED ORDER — SODIUM CHLORIDE 0.9% IV SOLUTION
Freq: Once | INTRAVENOUS | Status: AC
  Administered 2021-01-05: 500 mL via INTRAVENOUS

## 2021-01-05 NOTE — Telephone Encounter (Signed)
Pt's niece, Nolon Stalls (on [place]) called, she needs PA sent to the insurance for octreotide (SANDOSTATIN) 100 MCG/ML SOLN injection to be able to pick up the [employer]: [phone] Specialty Pharmacy contact: [phone]

## 2021-01-05 NOTE — Telephone Encounter (Signed)
I returned the call to the pt's niece on DPR. This medication is prescribed by Dr. Hurshel Keys. She will contact his office for the PA and further advice.

## 2021-01-05 NOTE — Telephone Encounter (Signed)
           Pt's niece, Nolon Stalls (on [place])        has called back stressing the urgency in needing to hear about what will be done about pt's injection.  Ms Sabra Heck stated the apixaban (ELIQUIS) 5 MG TABS tablet has caused internal bleeding and she doesn't want pt to endure this thru the weekend.  Please call.

## 2021-01-05 NOTE — Telephone Encounter (Signed)
Got a call from [facility] regarding pt.  They are upset and frustrated they never heard back from the office today. Hgb 5.7 this afternoon; Dr. Theador Hawthorne ordered transfusion which pt is currently receiving.  They have the prescription but need prior Auth. By her report[facility]ed form to our office today so prior authorization could be considered; they have not gotten it back.  At her request, I called [facility] at [facility] [phone]) Made verbal case for auth. for octreotide (off label use).  Conference call done with specialty pharmacist there Hoyle Sauer).  THEY DECLINED TO AUTHORIZE GIVEN THE INDICATION.  Spent 43 minutes on the call.  Will defer to [place] regarding further recommendations.    Belva Agee [phone]   .

## 2021-01-05 NOTE — Telephone Encounter (Signed)
Dr. Abbey Chatters The pt and her sister are calling saying that the medication you phoned in yesterday is not on file with them. The pt went to see her PCP yesterday and he advised because she has been out of her medication so long she now has to have 2 blood transfusions. What they are wanting you to do is:   1. Call [phone] and speak to them regarding this pt's Rx. (fax # [phone])  2. Arrange for this pt to have her blood transfusions done at [facility] instead of [facility] in [place] where the pt has been going. (the pt/sister does not like [facility]). (I don't know why her PCP could not arrange it).   Please make contact with this patient when you finish your morning pt's PLEASE.

## 2021-01-05 NOTE — Progress Notes (Signed)
CRITICAL VALUE ALERT  Critical Value:  hgb 5.7  Date & Time Notied:  01/05/2021  1450  Provider Notified: Dr. Theador Hawthorne  Orders Received/Actions taken:

## 2021-01-06 ENCOUNTER — Telehealth: Payer: Self-pay | Admitting: Internal Medicine

## 2021-01-06 LAB — BPAM RBC
Blood Product Expiration Date: 202202162359
Blood Product Expiration Date: 202202172359
ISSUE DATE / TIME: 202201131522
ISSUE DATE / TIME: 202201131652
Unit Type and Rh: 5100
Unit Type and Rh: 5100

## 2021-01-06 LAB — TYPE AND SCREEN
ABO/RH(D): O POS
Antibody Screen: NEGATIVE
Unit division: 0
Unit division: 0

## 2021-01-06 LAB — POCT HEMOGLOBIN-HEMACUE: Hemoglobin: 5.5 g/dL — CL (ref 12.0–15.0)

## 2021-01-06 NOTE — Telephone Encounter (Signed)
Very low risk procedure. Will defer to pharmacy team to review Eliquis.

## 2021-01-06 NOTE — Telephone Encounter (Signed)
Surgery Clearance

## 2021-01-06 NOTE — Telephone Encounter (Signed)
   [facility] Pre-operative Risk Assessment    HEARTCARE STAFF: - Please ensure there is not already an duplicate clearance open for this procedure. - Under Visit Info/Reason for Call, type in Other and utilize the format Clearance MM/DD/YY or Clearance TBD. Do not use dashes or single digits. - If request is for dental extraction, please clarify the # of teeth to be extracted.  Request for surgical clearance:  1. What type of surgery is being performed? Toenail removal  2. When is this surgery scheduled? TBD  3. What type of clearance is required (medical clearance vs. Pharmacy clearance to hold med vs. Both)? Pharmacy Clearance  4. Are there any medications that need to be held prior to surgery and how long? Blood Thinner  5. Practice name and name of physician performing surgery? Dr. Lind[facility]  6. What is the office phone number? [phone]   7.   What is the office fax number? [phone]  8.   Anesthesia type (None, local, MAC, general) ? None   [name] [name] 01/06/2021, 5:00 PM  _________________________________________________________________   (provider comments below)

## 2021-01-06 NOTE — Telephone Encounter (Signed)
Received 2 faxes this morning, one from [facility] with additional questions about medication and one from [facility] saying the medication has been approved. [facility] and spoke with Elmyra Ricks and she said they medication was approved and can be filled now. [facility] and spoke with Butch Penny and she ran the medication and it went through with a $3.95 copay. She said if the family could go ahead and call and set up delivery, they may be able to get the medication to her tomorrow. If they wait, it would be Tuesday before they can get it to her.  I called and spoke with [name] [name] and informed her of the above and asked her to go ahead and call [phone] and set up the delivery. She said she would call as soon as we get off the phone.

## 2021-01-06 NOTE — Telephone Encounter (Signed)
Communication noted.  

## 2021-01-06 NOTE — Telephone Encounter (Signed)
noted 

## 2021-01-09 NOTE — Telephone Encounter (Signed)
   Primary Cardiologist: Rozann Lesches, MD  Chart reviewed as part of pre-operative protocol coverage. Patient was contacted 01/09/2021 in reference to pre-operative risk assessment for pending surgery as outlined below.  [name] [name] was last seen on 12/21/20 by Dr. Lovena Le.  Since that day, [name] [name] has done well.  Therefore, based on ACC/AHA guidelines, the patient would be at acceptable risk for the planned procedure without further cardiovascular testing.   Per pharmacy review and office protocols she may hold her ELiquis 2 days prior to the planned procedure. She verbalized understanding of these directions.   The patient was advised that if she develops new symptoms prior to surgery to contact our office to arrange for a follow-up visit, and she verbalized understanding.  I will route this recommendation to the requesting party via Epic fax function and remove from pre-op pool. Please call with questions.  Loel Dubonnet, NP 01/09/2021, 10:06 AM

## 2021-01-09 NOTE — Telephone Encounter (Signed)
Patient with diagnosis of atrial fibrillatoin on Eliquis for anticoagulation.    Procedure: toenail removal Date of procedure: TBD    CHA2DS2-VASc Score = 3  This indicates a 3.2% annual risk of stroke. The patient's score is based upon: CHF History: No HTN History: Yes Diabetes History: No Stroke History: No Vascular Disease History: No Age Score: 1 Gender Score: 1  CrCl 22.2 Platelet count 400  Per office protocol, patient can hold Eliquis for 2 days prior to procedure.

## 2021-01-14 ENCOUNTER — Other Ambulatory Visit: Payer: Self-pay | Admitting: Family Medicine

## 2021-01-18 ENCOUNTER — Other Ambulatory Visit: Payer: Self-pay

## 2021-01-18 ENCOUNTER — Telehealth: Payer: Self-pay | Admitting: Internal Medicine

## 2021-01-18 ENCOUNTER — Ambulatory Visit: Payer: Medicare HMO | Admitting: Internal Medicine

## 2021-01-18 ENCOUNTER — Other Ambulatory Visit: Payer: Self-pay | Admitting: Internal Medicine

## 2021-01-18 ENCOUNTER — Encounter: Payer: Self-pay | Admitting: Internal Medicine

## 2021-01-18 VITALS — BP 141/64 | HR 59 | Temp 97.3°F | Ht 62.0 in | Wt 147.0 lb

## 2021-01-18 DIAGNOSIS — K5900 Constipation, unspecified: Secondary | ICD-10-CM

## 2021-01-18 DIAGNOSIS — D649 Anemia, unspecified: Secondary | ICD-10-CM

## 2021-01-18 DIAGNOSIS — K5521 Angiodysplasia of colon with hemorrhage: Secondary | ICD-10-CM

## 2021-01-18 DIAGNOSIS — D62 Acute posthemorrhagic anemia: Secondary | ICD-10-CM

## 2021-01-18 LAB — CBC
HCT: 31.5 % — ABNORMAL LOW (ref 35.0–45.0)
Hemoglobin: 9.9 g/dL — ABNORMAL LOW (ref 11.7–15.5)
MCH: 28.6 pg (ref 27.0–33.0)
MCHC: 31.4 g/dL — ABNORMAL LOW (ref 32.0–36.0)
MCV: 91 fL (ref 80.0–100.0)
MPV: 10 fL (ref 7.5–12.5)
Platelets: 320 10*3/uL (ref 140–400)
RBC: 3.46 10*6/uL — ABNORMAL LOW (ref 3.80–5.10)
RDW: 14.7 % (ref 11.0–15.0)
WBC: 6.3 10*3/uL (ref 3.8–10.8)

## 2021-01-18 NOTE — Progress Notes (Addendum)
Referring Provider: Andres Shad, * Primary Care Physician:  Andres Shad, MD Primary GI:  Dr. Abbey Chatters  Chief Complaint  Patient presents with  . Anemia    Has had dark stools, feels weak     HPI:   [name] [name] is a 75 y.o. female who presents to the clinic today for follow-up visit.  She has had 2 hospitalizations since previous visit for acute blood loss anemia.  Hospitalization in early December, she underwent EGD which was relatively unremarkable.  Subsequent small bowel capsule endoscopy revealed multiple small bowel AVMs.  She subsequently underwent enteroscopy with APC of multiple AVM lesions on 11/27/2020.    She was discharged from the facility and did well until she suffered a syncopal episode at home.  She reported back to the ER 12/05/2020 and was found to be anemic again with hemoglobin of 7.  She was started on subcutaneous octreotide during that hospitalization and has done well since that time.    Upon discharge we have had difficulty getting this medication approved through her specialty pharmacy and she went without it for a week or so.  She noted dark stools during that time and reported to the ER 01/05/2021 with a hemoglobin of 5.7.  She was given 2 units of PRBCs and discharged home.    We have finally gotten her subcutaneous octreotide approved through specialty pharmacy and states she is doing well again.  Notes normalization of her stools.  Also feels less fatigued.  Of note, patient is on Eliquis for afib.   Past Medical History:  Diagnosis Date  . Constipation 07/17/2017  . GERD (gastroesophageal reflux disease)   . Gout   . Grade II diastolic dysfunction 38/12/8297  . Hyperlipidemia   . Hypertension   . Hypokalemia   . OSA (obstructive sleep apnea) 08/09/2020  . PAF (paroxysmal atrial fibrillation) ([place])   . PAF (paroxysmal atrial fibrillation) ([place])   . PUD (peptic ulcer disease)   . Syncope    Neurocardiogenic    Past Surgical  History:  Procedure Laterality Date  . ABDOMINAL AORTOGRAM W/LOWER EXTREMITY Right 12/13/2020   Procedure: ABDOMINAL AORTOGRAM W/LOWER EXTREMITY;  Surgeon: Serafina Mitchell, MD;  Location: [facility];  Service: Cardiovascular;  Laterality: Right;  . ABDOMINAL HYSTERECTOMY    . AMPUTATION Right 12/14/2020   Procedure: Right fifth toe amputation;  Surgeon: Rosetta Posner, MD;  Location: [facility];  Service: Vascular;  Laterality: Right;  . CATARACT EXTRACTION Bilateral   . COLONOSCOPY N/A 10/07/2014   Dr. Oneida Alar: redundant left colon, moderate sized external hemorrhoids   . COLONOSCOPY N/A 03/11/2020   Procedure: COLONOSCOPY;  Surgeon: Danie Binder, MD;  [facility];  Service: Endoscopy;  Laterality: N/A;  9:30am w/ overtube  . ENTEROSCOPY N/A 11/27/2020   Procedure: ENTEROSCOPY;  Surgeon: Harvel Quale, MD;  [facility];  Service: Gastroenterology;  Laterality: N/A;  . ESOPHAGOGASTRODUODENOSCOPY (EGD) WITH PROPOFOL N/A 11/25/2020   Procedure: ESOPHAGOGASTRODUODENOSCOPY (EGD) WITH PROPOFOL;  Surgeon: Daneil Dolin, MD;  [facility];  Service: Endoscopy;  Laterality: N/A;  . GIVENS CAPSULE STUDY N/A 11/25/2020   Procedure: GIVENS CAPSULE STUDY;  Surgeon: Daneil Dolin, MD;  [facility];  Service: Endoscopy;  Laterality: N/A;  possible capsule if IDA unexplained by EGD findings.   Marland Kitchen HOT HEMOSTASIS  11/27/2020   Procedure: HOT HEMOSTASIS (ARGON PLASMA COAGULATION/BICAP);  Surgeon: Montez Morita, Quillian Quince, MD;  [facility];  Service: Gastroenterology;;  . LIPOMA RESECTION    . PERIPHERAL VASCULAR INTERVENTION Right 12/13/2020   Procedure: PERIPHERAL VASCULAR INTERVENTION;  Surgeon: Serafina Mitchell, MD;  Location: [facility];  Service: Cardiovascular;  Laterality: Right;  SFA/PT/AT  . POLYPECTOMY  03/11/2020   Procedure: POLYPECTOMY;  Surgeon: Danie Binder, MD;  [facility];  Service: Endoscopy;;   cecal, transverse, descending, sigmoid    Current Outpatient Medications  Medication Sig Dispense Refill  . acetaminophen (TYLENOL) 500 MG tablet Take 1,000 mg by mouth every 6 (six) hours as needed for moderate pain.    Marland Kitchen albuterol (VENTOLIN HFA) 108 (90 Base) MCG/ACT inhaler Inhale 1-2 puffs into the lungs every 6 (six) hours as needed for wheezing or shortness of breath.     Marland Kitchen apixaban (ELIQUIS) 5 MG TABS tablet Take 1 tablet (5 mg total) by mouth 2 (two) times daily. 180 tablet 3  . aspirin EC 81 MG EC tablet Take 1 tablet (81 mg total) by mouth daily. Swallow whole. 30 tablet 11  . cetirizine (ZYRTEC) 10 MG tablet Take 10 mg by mouth daily as needed for allergies.    . Cholecalciferol (VITAMIN D-3) 25 MCG (1000 UT) CAPS Take 1 capsule by mouth daily.    . cyclobenzaprine (FLEXERIL) 5 MG tablet Take 5 mg by mouth 3 (three) times daily as needed for muscle spasms.    . diclofenac Sodium (VOLTAREN) 1 % GEL Apply 1 application topically 4 (four) times daily as needed (pain).    Marland Kitchen ELDERBERRY PO Take 2 capsules by mouth daily.     . feeding supplement (ENSURE ENLIVE / ENSURE PLUS) LIQD Take 237 mLs by mouth 2 (two) times daily between meals. 237 mL 12  . ferrous sulfate 325 (65 FE) MG tablet Take 325 mg by mouth daily with breakfast.    . fluticasone (FLONASE) 50 MCG/ACT nasal spray Place 1 spray into both nostrils daily as needed for allergies or rhinitis.    . Fluticasone-Salmeterol (ADVAIR) 500-50 MCG/DOSE AEPB Inhale 1 puff into the lungs every 12 (twelve) hours.    . furosemide (LASIX) 40 MG tablet Take 1 tablet (40 mg total) by mouth daily. 90 tablet 3  . gabapentin (NEURONTIN) 300 MG capsule Take 300 mg by mouth 3 (three) times daily.    Marland Kitchen losartan (COZAAR) 25 MG tablet Take 0.5 tablets (12.5 mg total) by mouth daily. (Patient taking differently: Take 25 mg by mouth daily.)    . lubiprostone (AMITIZA) 24 MCG capsule Take 1 capsule (24 mcg total) by mouth in the morning and at bedtime. 180  capsule 3  . meclizine (ANTIVERT) 25 MG tablet Take 25 mg by mouth 3 (three) times daily as needed for dizziness.    Marland Kitchen octreotide (SANDOSTATIN) 100 MCG/ML SOLN injection Inject 1 mL (100 mcg total) into the skin every 12 (twelve) hours. 60 mL 2  . pantoprazole (PROTONIX) 40 MG tablet TAKE 1 TABLET (40 MG TOTAL) BY MOUTH 2 (TWO) TIMES DAILY BEFORE A MEAL. 90 tablet 0  . potassium chloride SA (KLOR-CON) 20 MEQ tablet Take 20 mEq by mouth in the morning, at noon, and at bedtime.    . rosuvastatin (CRESTOR) 10 MG tablet TAKE 1 TABLET BY MOUTH EVERY DAY 30 tablet 0  . vitamin C (ASCORBIC ACID) 250 MG tablet Take 500 mg by mouth daily.    . flecainide (TAMBOCOR) 50 MG tablet Take 1 tablet (50 mg total) by mouth 2 (two) times daily. 60 tablet  3  . Nutritional Supplements (,FEEDING SUPPLEMENT, PROSOURCE PLUS) liquid Take 30 mLs by mouth 2 (two) times daily between meals. (Patient not taking: Reported on 01/18/2021)    . oxycodone (OXY-IR) 5 MG capsule Take 2 capsules (10 mg total) by mouth every 6 (six) hours as needed for pain. (Patient not taking: Reported on 01/18/2021) 15 capsule 0   No current facility-administered medications for this visit.    Allergies as of 01/18/2021 - Review Complete 01/18/2021  Allergen Reaction Noted  . Acetaminophen-codeine Hives   . Amlodipine  08/03/2020  . Amoxicillin Hives 03/04/2020  . Doxycycline  11/23/2020  . Acetaminophen-codeine Rash 09/16/2020  . Allopurinol Hives and Rash   . Tramadol Hives and Rash     Family History  Problem Relation Age of Onset  . Stroke Mother   . Dementia Father   . Cancer - Other Sister   . Atrial fibrillation Sister   . Heart failure Sister   . Colon cancer Maternal Grandmother        diagnosed in her 84s    Social History   Socioeconomic History  . Marital status: Single    Spouse name: Not on file  . Number of children: Not on file  . Years of education: Not on file  . Highest education level: Not on file   Occupational History  . Occupation: Retired  Tobacco Use  . Smoking status: Former Smoker    Packs/day: 1.00    Years: 20.00    Pack years: 20.00    Types: Cigarettes    Start date: 03/16/1964    Quit date: 10/07/1997    Years since quitting: 23.2  . Smokeless tobacco: Never Used  Vaping Use  . Vaping Use: Never used  Substance and Sexual Activity  . Alcohol use: No    Alcohol/week: 0.0 standard drinks  . Drug use: No  . Sexual activity: Yes    Partners: Male  Other Topics Concern  . Not on file  Social History Narrative  . Not on file   Social Determinants of Health   Financial Resource Strain: Not on file  Food Insecurity: Not on file  Transportation Needs: Not on file  Physical Activity: Not on file  Stress: Not on file  Social Connections: Not on file    Subjective: Review of Systems  Constitutional: Negative for chills and fever.  HENT: Negative for congestion and hearing loss.   Eyes: Negative for blurred vision and double vision.  Respiratory: Negative for cough and shortness of breath.   Cardiovascular: Negative for chest pain and palpitations.  Gastrointestinal: Negative for abdominal pain, blood in stool, constipation, diarrhea, heartburn, melena and vomiting.  Genitourinary: Negative for dysuria and urgency.  Musculoskeletal: Negative for joint pain and myalgias.  Skin: Negative for itching and rash.  Neurological: Negative for dizziness and headaches.  Psychiatric/Behavioral: Negative for depression. The patient is not nervous/anxious.      Objective: BP (!) 141/64   Pulse (!) 59   Temp (!) 97.3 F (36.3 C) (Temporal)   Ht 5\' 2"  (1.575 m)   Wt 147 lb (66.7 kg)   BMI 26.89 kg/m  Physical Exam Constitutional:      Appearance: Normal appearance.  HENT:     Head: Normocephalic and atraumatic.  Eyes:     Extraocular Movements: Extraocular movements intact.     Conjunctiva/sclera: Conjunctivae normal.  Cardiovascular:     Rate and Rhythm:  Normal rate and regular rhythm.  Pulmonary:     Effort:  Pulmonary effort is normal.     Breath sounds: Normal breath sounds.  Abdominal:     General: Bowel sounds are normal.     Palpations: Abdomen is soft.  Musculoskeletal:        General: No swelling. Normal range of motion.     Cervical back: Normal range of motion and neck supple.  Skin:    General: Skin is warm and dry.     Coloration: Skin is not jaundiced.  Neurological:     General: No focal deficit present.     Mental Status: She is alert and oriented to person, place, and time.  Psychiatric:        Mood and Affect: Mood normal.        Behavior: Behavior normal.      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    We will check blood counts today in clinic.  Patient does note feeling better as well as normalization of her stool color since restarting her subcutaneous octreotide injections.  We will continue.  I will refer her to Dr. Malissa Hippo at [facility] to discuss potential deep enteroscopy for treatment of her small bowel AVMs.  Appreciate his help with this difficult patient.  Patient follow-up with me in 3 to 4 months or sooner if needed.  01/18/2021 2:39 PM   Disclaimer: This note was dictated with voice recognition software. Similar sounding words can inadvertently be transcribed and may not be corrected upon review.

## 2021-01-18 NOTE — Telephone Encounter (Signed)
Can we please refer this patient to Dr. Malissa Hippo at [facility] for potential deep enteroscopy.  Diagnosis small bowel AVMs and anemia due to chronic blood loss. Thank you

## 2021-01-18 NOTE — Patient Instructions (Addendum)
Continue on octreotide injections.  I will check your blood counts today and call you with results.  I will reach out to Dr. Jenetta Downer to see if he is able to perform deep enteroscopy at our facility.  If not, we will refer you to [facility] to see Dr. Malissa Hippo.  Follow-up with me in 3 months or sooner if needed.  At [facility] we value your feedback. You may receive a survey about your visit today. Please share your experience as we strive to create trusting relationships with our patients to provide genuine, compassionate, quality care.  We appreciate your understanding and patience as we review any laboratory studies, imaging, and other diagnostic tests that are ordered as we care for you. Our office policy is 5 business days for review of these results, and any emergent or urgent results are addressed in a timely manner for your best interest. If you do not hear from our office in 1 week, please contact us.   We also encourage the use of MyChart, which contains your medical information for your review as well. If you are not enrolled in this feature, an access code is on this after visit summary for your convenience. Thank you for allowing [place] to be involved in your care.  It was great to see you today!  I hope you have a great rest of your winter!!    Elon Alas. Abbey Chatters, D.O. Gastroenterology and Hepatology [facility]

## 2021-01-18 NOTE — Telephone Encounter (Signed)
Noted , routed to [facility] to send referral

## 2021-01-18 NOTE — Telephone Encounter (Signed)
error 

## 2021-01-19 NOTE — Telephone Encounter (Signed)
Referral faxed

## 2021-01-19 NOTE — Addendum Note (Signed)
Addended by: Cheron Every on: 01/19/2021 07:36 AM   Modules accepted: Orders

## 2021-01-23 ENCOUNTER — Encounter: Payer: Self-pay | Admitting: Vascular Surgery

## 2021-01-23 ENCOUNTER — Other Ambulatory Visit: Payer: Self-pay

## 2021-01-23 ENCOUNTER — Ambulatory Visit: Payer: Medicare HMO | Admitting: Vascular Surgery

## 2021-01-23 VITALS — BP 132/74 | HR 63 | Temp 97.9°F | Resp 16 | Ht 62.0 in | Wt 148.0 lb

## 2021-01-23 DIAGNOSIS — Z89421 Acquired absence of other right toe(s): Secondary | ICD-10-CM

## 2021-01-23 DIAGNOSIS — I739 Peripheral vascular disease, unspecified: Secondary | ICD-10-CM

## 2021-01-23 NOTE — Progress Notes (Signed)
[facility] [place]  Patient name: [name] [name] MRN: [id number] DOB: 06/19/1946 Sex: female  REASON FOR VISIT: Follow-up right fifth toe amputation  HPI: [name] [name] is a 75 y.o. female here today follow-up with her daughter.  She is status post right SFA angioplasty and anterior tibial angioplasty with Dr. Trula Slade on 12/13/2020.  The subsequent day I took her to the operating room where she underwent a right fifth toe amputation.  Metatarsal head was not resected.  She is here today for continued follow-up.  Current Outpatient Medications  Medication Sig Dispense Refill  . acetaminophen (TYLENOL) 500 MG tablet Take 1,000 mg by mouth every 6 (six) hours as needed for moderate pain.    Marland Kitchen albuterol (VENTOLIN HFA) 108 (90 Base) MCG/ACT inhaler Inhale 1-2 puffs into the lungs every 6 (six) hours as needed for wheezing or shortness of breath.     Marland Kitchen apixaban (ELIQUIS) 5 MG TABS tablet Take 1 tablet (5 mg total) by mouth 2 (two) times daily. 180 tablet 3  . aspirin EC 81 MG EC tablet Take 1 tablet (81 mg total) by mouth daily. Swallow whole. 30 tablet 11  . cetirizine (ZYRTEC) 10 MG tablet Take 10 mg by mouth daily as needed for allergies.    . Cholecalciferol (VITAMIN D-3) 25 MCG (1000 UT) CAPS Take 1 capsule by mouth daily.    . cyclobenzaprine (FLEXERIL) 5 MG tablet Take 5 mg by mouth 3 (three) times daily as needed for muscle spasms.    . diclofenac Sodium (VOLTAREN) 1 % GEL Apply 1 application topically 4 (four) times daily as needed (pain).    Marland Kitchen ELDERBERRY PO Take 2 capsules by mouth daily.     . feeding supplement (ENSURE ENLIVE / ENSURE PLUS) LIQD Take 237 mLs by mouth 2 (two) times daily between meals. 237 mL 12  . ferrous sulfate 325 (65 FE) MG tablet Take 325 mg by mouth daily with breakfast.    . fluticasone (FLONASE) 50 MCG/ACT nasal spray Place 1 spray into both nostrils daily as needed for allergies or rhinitis.    .  Fluticasone-Salmeterol (ADVAIR) 500-50 MCG/DOSE AEPB Inhale 1 puff into the lungs every 12 (twelve) hours.    . furosemide (LASIX) 40 MG tablet Take 1 tablet (40 mg total) by mouth daily. 90 tablet 3  . gabapentin (NEURONTIN) 300 MG capsule Take 300 mg by mouth 3 (three) times daily.    Marland Kitchen losartan (COZAAR) 25 MG tablet Take 0.5 tablets (12.5 mg total) by mouth daily. (Patient taking differently: Take 25 mg by mouth daily.)    . lubiprostone (AMITIZA) 24 MCG capsule Take 1 capsule (24 mcg total) by mouth in the morning and at bedtime. 180 capsule 3  . meclizine (ANTIVERT) 25 MG tablet Take 25 mg by mouth 3 (three) times daily as needed for dizziness.    . Nutritional Supplements (,FEEDING SUPPLEMENT, PROSOURCE PLUS) liquid Take 30 mLs by mouth 2 (two) times daily between meals.    Marland Kitchen octreotide (SANDOSTATIN) 100 MCG/ML SOLN injection Inject 1 mL (100 mcg total) into the skin every 12 (twelve) hours. 60 mL 2  . oxycodone (OXY-IR) 5 MG capsule Take 2 capsules (10 mg total) by mouth every 6 (six) hours as needed for pain. 15 capsule 0  . pantoprazole (PROTONIX) 40 MG tablet TAKE 1 TABLET (40 MG TOTAL) BY MOUTH 2 (TWO) TIMES DAILY BEFORE A MEAL. 90 tablet 0  . potassium chloride SA (KLOR-CON) 20 MEQ tablet Take 20  mEq by mouth in the morning, at noon, and at bedtime.    . rosuvastatin (CRESTOR) 10 MG tablet TAKE 1 TABLET BY MOUTH EVERY DAY 30 tablet 0  . vitamin C (ASCORBIC ACID) 250 MG tablet Take 500 mg by mouth daily.    . flecainide (TAMBOCOR) 50 MG tablet Take 1 tablet (50 mg total) by mouth 2 (two) times daily. 60 tablet 3   No current facility-administered medications for this visit.     PHYSICAL EXAM: Vitals:   01/23/21 1022  BP: 132/74  Pulse: 63  Resp: 16  Temp: 97.9 F (36.6 C)  TempSrc: Other (Comment)  SpO2: 98%  Weight: 148 lb (67.1 kg)  Height: 5\' 2"  (1.575 m)    GENERAL: The patient is a well-nourished female, in no acute distress. The vital signs are documented  above. Right fifth toe continues with excellent healing.  Sutures removed.  She did have an area of ingrown toenail on the left and had the medial aspect of this trimmed with podiatry and this appears to be healing as well  Hand-held Doppler of both lower extremities revealed posterior tibial is the best signal with good peroneal flow as well.  Dampened anterior tibial flow bilaterally  MEDICAL ISSUES: Stable overall.  We will continue full activities.  I will see her again in 3 months with repeat noninvasive studies with imaging of the angioplasty site and ankle arm and   [name] Posner, MD [facility] Tel [phone]

## 2021-01-24 ENCOUNTER — Ambulatory Visit: Payer: Medicare HMO | Admitting: Internal Medicine

## 2021-01-24 ENCOUNTER — Other Ambulatory Visit: Payer: Self-pay

## 2021-01-24 DIAGNOSIS — I739 Peripheral vascular disease, unspecified: Secondary | ICD-10-CM

## 2021-01-25 ENCOUNTER — Ambulatory Visit: Payer: Medicare HMO | Admitting: Family Medicine

## 2021-01-26 ENCOUNTER — Other Ambulatory Visit (HOSPITAL_COMMUNITY)
Admission: RE | Admit: 2021-01-26 | Discharge: 2021-01-26 | Disposition: A | Discharge status: Home / Self Care | Payer: Medicare HMO | Source: Ambulatory Visit | Attending: Nephrology | Admitting: Nephrology

## 2021-01-26 ENCOUNTER — Other Ambulatory Visit: Payer: Self-pay

## 2021-01-26 DIAGNOSIS — N184 Chronic kidney disease, stage 4 (severe): Secondary | ICD-10-CM | POA: Insufficient documentation

## 2021-01-26 DIAGNOSIS — N170 Acute kidney failure with tubular necrosis: Secondary | ICD-10-CM | POA: Insufficient documentation

## 2021-01-26 DIAGNOSIS — I129 Hypertensive chronic kidney disease with stage 1 through stage 4 chronic kidney disease, or unspecified chronic kidney disease: Secondary | ICD-10-CM | POA: Insufficient documentation

## 2021-01-26 DIAGNOSIS — E871 Hypo-osmolality and hyponatremia: Secondary | ICD-10-CM | POA: Insufficient documentation

## 2021-01-26 LAB — CBC
HCT: 29.6 % — ABNORMAL LOW (ref 36.0–46.0)
Hemoglobin: 9 g/dL — ABNORMAL LOW (ref 12.0–15.0)
MCH: 28.9 pg (ref 26.0–34.0)
MCHC: 30.4 g/dL (ref 30.0–36.0)
MCV: 95.2 fL (ref 80.0–100.0)
Platelets: 286 10*3/uL (ref 150–400)
RBC: 3.11 MIL/uL — ABNORMAL LOW (ref 3.87–5.11)
RDW: 14.9 % (ref 11.5–15.5)
WBC: 8.1 10*3/uL (ref 4.0–10.5)
nRBC: 0 % (ref 0.0–0.2)

## 2021-01-26 LAB — RENAL FUNCTION PANEL
Albumin: 3.8 g/dL (ref 3.5–5.0)
Anion gap: 9 (ref 5–15)
BUN: 39 mg/dL — ABNORMAL HIGH (ref 8–23)
CO2: 28 mmol/L (ref 22–32)
Calcium: 9.3 mg/dL (ref 8.9–10.3)
Chloride: 100 mmol/L (ref 98–111)
Creatinine, Ser: 3.36 mg/dL — ABNORMAL HIGH (ref 0.44–1.00)
GFR, Estimated: 14 mL/min — ABNORMAL LOW (ref >60)
Glucose, Bld: 99 mg/dL (ref 70–99)
Phosphorus: 4.4 mg/dL (ref 2.5–4.6)
Potassium: 4 mmol/L (ref 3.5–5.1)
Sodium: 137 mmol/L (ref 135–145)

## 2021-01-26 LAB — IRON AND TIBC
Iron: 29 ug/dL (ref 28–170)
Saturation Ratios: 9 % — ABNORMAL LOW (ref 10.4–31.8)
TIBC: 325 ug/dL (ref 250–450)
UIBC: 296 ug/dL

## 2021-01-30 ENCOUNTER — Telehealth: Payer: Self-pay | Admitting: Neurology

## 2021-01-30 ENCOUNTER — Encounter: Payer: Self-pay | Admitting: Neurology

## 2021-01-30 NOTE — Telephone Encounter (Signed)
Received an email today informing us they have attempted to reach the pt multiple times without success.  "I have been trying to get in touch with [name] [name] to get her set up with a CPAP since 01/20, however- I have not been able to get her to answer. Below are the numbers and emails I have contacted trying to speak with her. It is time to void this order- I wasn't sure if you wanted to reach out to [place] first to see if they could get in contact with her or had any other contact methods? "  I have sent a mychart message to the patient to attempt reaching out that way.

## 2021-02-02 ENCOUNTER — Encounter (HOSPITAL_COMMUNITY)
Admission: RE | Admit: 2021-02-02 | Discharge: 2021-02-02 | Disposition: A | Discharge status: Home / Self Care | Payer: Medicare HMO | Source: Ambulatory Visit | Attending: Nephrology | Admitting: Nephrology

## 2021-02-02 ENCOUNTER — Other Ambulatory Visit: Payer: Self-pay

## 2021-02-02 DIAGNOSIS — D631 Anemia in chronic kidney disease: Secondary | ICD-10-CM | POA: Insufficient documentation

## 2021-02-02 LAB — POCT HEMOGLOBIN-HEMACUE: Hemoglobin: 7.9 g/dL — ABNORMAL LOW (ref 12.0–15.0)

## 2021-02-02 MED ORDER — SODIUM CHLORIDE 0.9 % IV SOLN: Freq: Once | INTRAVENOUS | Status: AC

## 2021-02-02 MED ORDER — EPOETIN ALFA-EPBX 3000 UNIT/ML IJ SOLN
3000.0000 [IU] | Freq: Once | INTRAMUSCULAR | Status: AC
  Administered 2021-02-02: 3000 [IU] via SUBCUTANEOUS

## 2021-02-02 MED ORDER — EPOETIN ALFA-EPBX 3000 UNIT/ML IJ SOLN
INTRAMUSCULAR | Status: AC
  Filled 2021-02-02: qty 1

## 2021-02-02 MED ORDER — SODIUM CHLORIDE 0.9 % IV SOLN
510.0000 mg | Freq: Once | INTRAVENOUS | Status: AC
  Administered 2021-02-02: 510 mg via INTRAVENOUS
  Filled 2021-02-02: qty 17

## 2021-02-02 NOTE — Discharge Instructions (Signed)
Epoetin Alfa injection What is this medicine? EPOETIN ALFA (e POE e tin AL fa) helps your body make more red blood cells. This medicine is used to treat anemia caused by chronic kidney disease, cancer chemotherapy, or HIV-therapy. It may also be used before surgery if you have anemia. This medicine may be used for other purposes; ask your health care provider or pharmacist if you have questions. COMMON BRAND NAME(S): Epogen, Procrit, Retacrit What should I tell my health care provider before I take this medicine? They need to know if you have any of these conditions:  cancer  heart disease  high blood pressure  history of blood clots  history of stroke  low levels of folate, iron, or vitamin B12 in the blood  seizures  an unusual or allergic reaction to erythropoietin, albumin, benzyl alcohol, hamster proteins, other medicines, foods, dyes, or preservatives  pregnant or trying to get pregnant  breast-feeding How should I use this medicine? This medicine is for injection into a vein or under the skin. It is usually given by a health care professional in a hospital or clinic setting. If you get this medicine at home, you will be taught how to prepare and give this medicine. Use exactly as directed. Take your medicine at regular intervals. Do not take your medicine more often than directed. It is important that you put your used needles and syringes in a special sharps container. Do not put them in a trash can. If you do not have a sharps container, call your pharmacist or healthcare provider to get one. A special MedGuide will be given to you by the pharmacist with each prescription and refill. Be sure to read this information carefully each time. Talk to your pediatrician regarding the use of this medicine in children. While this drug may be prescribed for selected conditions, precautions do apply. Overdosage: If you think you have taken too much of this medicine contact a poison  control center or emergency room at once. NOTE: This medicine is only for you. Do not share this medicine with others. What if I miss a dose? If you miss a dose, take it as soon as you can. If it is almost time for your next dose, take only that dose. Do not take double or extra doses. What may interact with this medicine? Interactions have not been studied. This list may not describe all possible interactions. Give your health care provider a list of all the medicines, herbs, non-prescription drugs, or dietary supplements you use. Also tell them if you smoke, drink alcohol, or use illegal drugs. Some items may interact with your medicine. What should I watch for while using this medicine? Your condition will be monitored carefully while you are receiving this medicine. You may need blood work done while you are taking this medicine. This medicine may cause a decrease in vitamin B6. You should make sure that you get enough vitamin B6 while you are taking this medicine. Discuss the foods you eat and the vitamins you take with your health care professional. What side effects may I notice from receiving this medicine? Side effects that you should report to your doctor or health care professional as soon as possible:  allergic reactions like skin rash, itching or hives, swelling of the face, lips, or tongue  seizures  signs and symptoms of a blood clot such as breathing problems; changes in vision; chest pain; severe, sudden headache; pain, swelling, warmth in the leg; trouble speaking; sudden numbness or   weakness of the face, arm or leg  signs and symptoms of a stroke like changes in vision; confusion; trouble speaking or understanding; severe headaches; sudden numbness or weakness of the face, arm or leg; trouble walking; dizziness; loss of balance or coordination Side effects that usually do not require medical attention (report to your doctor or health care professional if they continue or are  bothersome):  chills  cough  dizziness  fever  headaches  joint pain  muscle cramps  muscle pain  nausea, vomiting  pain, redness, or irritation at site where injected This list may not describe all possible side effects. Call your doctor for medical advice about side effects. You may report side effects to FDA at 1-800-FDA-1088. Where should I keep my medicine? Keep out of the reach of children. Store in a refrigerator between 2 and 8 degrees C (36 and 46 degrees F). Do not freeze or shake. Throw away any unused portion if using a single-dose vial. Multi-dose vials can be kept in the refrigerator for up to 21 days after the initial dose. Throw away unused medicine. NOTE: This sheet is a summary. It may not cover all possible information. If you have questions about this medicine, talk to your doctor, pharmacist, or health care provider.  2021 Elsevier/Gold Standard (2017-07-19 08:35:19) Ferumoxytol injection What is this medicine? FERUMOXYTOL is an iron complex. Iron is used to make healthy red blood cells, which carry oxygen and nutrients throughout the body. This medicine is used to treat iron deficiency anemia. This medicine may be used for other purposes; ask your health care provider or pharmacist if you have questions. COMMON BRAND NAME(S): Feraheme What should I tell my health care provider before I take this medicine? They need to know if you have any of these conditions:  anemia not caused by low iron levels  high levels of iron in the blood  magnetic resonance imaging (MRI) test scheduled  an unusual or allergic reaction to iron, other medicines, foods, dyes, or preservatives  pregnant or trying to get pregnant  breast-feeding How should I use this medicine? This medicine is for injection into a vein. It is given by a health care professional in a hospital or clinic setting. Talk to your pediatrician regarding the use of this medicine in children. Special  care may be needed. Overdosage: If you think you have taken too much of this medicine contact a poison control center or emergency room at once. NOTE: This medicine is only for you. Do not share this medicine with others. What if I miss a dose? It is important not to miss your dose. Call your doctor or health care professional if you are unable to keep an appointment. What may interact with this medicine? This medicine may interact with the following medications:  other iron products This list may not describe all possible interactions. Give your health care provider a list of all the medicines, herbs, non-prescription drugs, or dietary supplements you use. Also tell them if you smoke, drink alcohol, or use illegal drugs. Some items may interact with your medicine. What should I watch for while using this medicine? Visit your doctor or healthcare professional regularly. Tell your doctor or healthcare professional if your symptoms do not start to get better or if they get worse. You may need blood work done while you are taking this medicine. You may need to follow a special diet. Talk to your doctor. Foods that contain iron include: whole grains/cereals, dried fruits, beans, or   peas, leafy green vegetables, and organ meats (liver, kidney). What side effects may I notice from receiving this medicine? Side effects that you should report to your doctor or health care professional as soon as possible:  allergic reactions like skin rash, itching or hives, swelling of the face, lips, or tongue  breathing problems  changes in blood pressure  feeling faint or lightheaded, falls  fever or chills  flushing, sweating, or hot feelings  swelling of the ankles or feet Side effects that usually do not require medical attention (report to your doctor or health care professional if they continue or are bothersome):  diarrhea  headache  nausea, vomiting  stomach pain This list may not describe  all possible side effects. Call your doctor for medical advice about side effects. You may report side effects to FDA at 1-800-FDA-1088. Where should I keep my medicine? This drug is given in a hospital or clinic and will not be stored at home. NOTE: This sheet is a summary. It may not cover all possible information. If you have questions about this medicine, talk to your doctor, pharmacist, or health care provider.  2021 Elsevier/Gold Standard (2017-01-28 20:21:10)  

## 2021-02-09 ENCOUNTER — Other Ambulatory Visit: Payer: Self-pay

## 2021-02-09 ENCOUNTER — Encounter (HOSPITAL_COMMUNITY): Admission: RE | Admit: 2021-02-09 | Payer: Medicare HMO | Source: Ambulatory Visit

## 2021-02-09 ENCOUNTER — Encounter (HOSPITAL_COMMUNITY)
Admission: RE | Admit: 2021-02-09 | Discharge: 2021-02-09 | Disposition: A | Discharge status: Home / Self Care | Payer: Medicare HMO | Source: Ambulatory Visit | Attending: Nephrology | Admitting: Nephrology

## 2021-02-09 ENCOUNTER — Encounter (HOSPITAL_COMMUNITY): Payer: Self-pay

## 2021-02-09 DIAGNOSIS — D631 Anemia in chronic kidney disease: Secondary | ICD-10-CM | POA: Diagnosis not present

## 2021-02-09 MED ORDER — SODIUM CHLORIDE 0.9 % IV SOLN
510.0000 mg | Freq: Once | INTRAVENOUS | Status: AC
  Administered 2021-02-09: 510 mg via INTRAVENOUS
  Filled 2021-02-09: qty 510

## 2021-02-09 MED ORDER — SODIUM CHLORIDE 0.9 % IV SOLN: Freq: Once | INTRAVENOUS | Status: AC

## 2021-02-11 ENCOUNTER — Telehealth: Payer: Self-pay | Admitting: Physician Assistant

## 2021-02-11 NOTE — Telephone Encounter (Addendum)
   Pt called because she is having more rectal bleeding on the Eliquis. Wants to know if she should stop it.  1 week ago, Hgb was 7.9, she has had 2 iron infusions since then.  I discussed the risk versus benefits of Eliquis with the patient.  I asked her to continue taking the Eliquis for now, I will discuss this with Dr. Lovena Le and get back to her.  I also advised that if the bleeding is severe and she feels she just needs to stop the Eliquis, I understand.  Rosaria Ferries, PA-C 02/11/2021 9:10 AM   Spoke with Dr. Lovena Le called the patient back. She is to stop the Eliquis for now. I will send a message to get her a follow-up appointment to discuss this.  Rosaria Ferries, PA-C 02/11/2021 9:19 AM

## 2021-02-13 ENCOUNTER — Encounter: Payer: Self-pay | Admitting: Internal Medicine

## 2021-02-14 ENCOUNTER — Other Ambulatory Visit: Payer: Self-pay | Admitting: Family Medicine

## 2021-02-14 NOTE — Telephone Encounter (Signed)
error 

## 2021-02-16 ENCOUNTER — Encounter (HOSPITAL_COMMUNITY)
Admission: RE | Admit: 2021-02-16 | Discharge: 2021-02-16 | Disposition: A | Discharge status: Home / Self Care | Payer: Medicare HMO | Source: Ambulatory Visit | Attending: Nephrology | Admitting: Nephrology

## 2021-02-16 ENCOUNTER — Encounter (HOSPITAL_COMMUNITY): Payer: Self-pay

## 2021-02-16 ENCOUNTER — Other Ambulatory Visit: Payer: Self-pay

## 2021-02-16 DIAGNOSIS — D631 Anemia in chronic kidney disease: Secondary | ICD-10-CM | POA: Diagnosis not present

## 2021-02-16 HISTORY — DX: Anemia, unspecified: D64.9

## 2021-02-16 HISTORY — DX: Chronic kidney disease, unspecified: N18.9

## 2021-02-16 LAB — PREPARE RBC (CROSSMATCH)

## 2021-02-16 LAB — POCT HEMOGLOBIN-HEMACUE: Hemoglobin: 6.7 g/dL — CL (ref 12.0–15.0)

## 2021-02-16 MED ORDER — EPOETIN ALFA-EPBX 3000 UNIT/ML IJ SOLN
INTRAMUSCULAR | Status: AC
  Filled 2021-02-16: qty 1

## 2021-02-16 MED ORDER — EPOETIN ALFA-EPBX 3000 UNIT/ML IJ SOLN
3000.0000 [IU] | Freq: Once | INTRAMUSCULAR | Status: AC
  Administered 2021-02-16: 3000 [IU] via SUBCUTANEOUS

## 2021-02-17 ENCOUNTER — Encounter (HOSPITAL_COMMUNITY): Payer: Self-pay

## 2021-02-17 ENCOUNTER — Ambulatory Visit (HOSPITAL_COMMUNITY): Payer: Medicare HMO

## 2021-02-17 ENCOUNTER — Encounter (HOSPITAL_BASED_OUTPATIENT_CLINIC_OR_DEPARTMENT_OTHER)
Admission: RE | Admit: 2021-02-17 | Discharge: 2021-02-17 | Disposition: A | Discharge status: Home / Self Care | Payer: Medicare HMO | Source: Ambulatory Visit | Attending: Nephrology | Admitting: Nephrology

## 2021-02-17 DIAGNOSIS — D631 Anemia in chronic kidney disease: Secondary | ICD-10-CM | POA: Diagnosis not present

## 2021-02-17 LAB — HEMOGLOBIN AND HEMATOCRIT, BLOOD
HCT: 25 % — ABNORMAL LOW (ref 36.0–46.0)
Hemoglobin: 7.4 g/dL — ABNORMAL LOW (ref 12.0–15.0)

## 2021-02-17 MED ORDER — SODIUM CHLORIDE 0.9% IV SOLUTION: Freq: Once | INTRAVENOUS | Status: AC

## 2021-02-18 LAB — TYPE AND SCREEN
ABO/RH(D): O POS
Antibody Screen: NEGATIVE
Unit division: 0
Unit division: 0

## 2021-02-18 LAB — BPAM RBC
Blood Product Expiration Date: 202203242359
Blood Product Expiration Date: 202203252359
ISSUE DATE / TIME: 202202250939
ISSUE DATE / TIME: 202202251152
Unit Type and Rh: 5100
Unit Type and Rh: 5100

## 2021-02-21 ENCOUNTER — Telehealth: Payer: Self-pay | Admitting: Internal Medicine

## 2021-02-21 NOTE — Telephone Encounter (Signed)
Appointment with Dr. Lovena Le is needed in [place] to discuss Eliquis or a different medication. She has not heard back from [facility] or Dr. Lovena Le.

## 2021-02-21 NOTE — Telephone Encounter (Signed)
Instructed front office to make an apt with Dr.Taylor.

## 2021-02-22 NOTE — Progress Notes (Signed)
Referring Provider: Andres Shad, * Primary Care Physician:  Andres Shad, MD Primary GI Physician: Dr. Abbey Chatters  Chief Complaint  Patient presents with   Rectal Bleeding    None in 2 weeks since stopping eliquis    HPI:   [name] [name] is a 75 y.o. female presenting today for follow-up.   History of syncope, paroxysmal atrial fibrillation on Eliquis, hypertension, hyperlipidemia, grade 2 diastolic dysfunction, CKD, GERD, constipation, anemia in the setting of chronic disease and GI bleed secondary to [facility]. Hospitalized in early December 2021 with acute blood loss anemia and heme positive stool s/p EGD, capsule study, and undergoing enteroscopy 12/5 for multiple duodenal AVMs s/p APC ablation, few AVMs in proximal jejunum also ablated with APC. Prior colonoscopy March 2021 showed 9 polyps, majority of which were tubular adenomas. Repeat colonoscopy has been deferred due to age.  Hospitalized again in mid December with acute on chronic anemia, heme positive stool without overt GI bleeding.  Suspected bleeding from AVMs in setting of Eliquis.  She started on octreotide 100 mcg twice daily subcu.  There was some trouble getting octreotide covered, patient noted dark stools during that time and presented to the ER 01/05/2021 with hemoglobin of 5.7.  She received 2 units PRBCs and was discharged home.  Last seen in our office 01/18/2021.  She had finally gotten octreotide approved through specialty pharmacy.  She was doing well and noted normalization of her stools. Fatigue was improving.  Plan to update labs, refer to Dr. Heide Guile at [facility] to discuss possible deep enteroscopy for treatment of small bowel AVMs, follow-up in 3-4 months.  Hemoglobin was improved to 9.9.  She has an appointment with [facility] on 3/23.   She is also receiving iron and retacrit with nephrology.   Telephone note with cardiology 02/11/2021 for patient reporting worsening rectal bleeding with Eliquis.   She was advised to discontinue Eliquis. Has appt with cardiology on 3/29.   Hemoglobin down to 6.7 on 2/24. Received 2 units PRBCs on 2/25.  Today: Stopped Eliquis 2 weeks ago and she hasn't had any rectal bleeding. Has been feeling great. States she has been feeling better than she has had in a long time.   Prior to stopping Eliquis, she saw some bright red blood twice in February. Blood was in toilet water and when wiping, not mixed in stools.  Also had a couple black stools at this time.  Since stopping Eliquis, has not had any further rectal bleeding.  Avoid all NSAIDs aside from daily baby aspirin.  No blood work since 2/25.   Constipation well controlled on Amitiza 24 mcg twice daily.  Needs a refill. No abdominal pain, nausea, vomiting, GERD, or dysphagia.  Appetite improved.  She is gaining weight.  Continues on Protonix 40 mg twice daily.  4 small non bleeding AVMs in the proximal small bowel (07:30, 08:29, 10:42 and 22:21). There was evidence of large amount of fresh blood in the lumen in the proximal small bowel (22:50) with scant amount of hematin in the duodenum (04:29). 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  Past Medical History:  Diagnosis Date   Anemia    Chronic kidney disease    Constipation 07/17/2017   GERD (gastroesophageal reflux disease)    Gout    Grade II diastolic dysfunction 40/08/8118   Hyperlipidemia    Hypertension    Hypokalemia    OSA (obstructive sleep apnea) 08/09/2020   PAF (paroxysmal atrial fibrillation) (  HCC)    PAF (paroxysmal atrial fibrillation) (HCC)    PUD (peptic ulcer disease)    Syncope    Neurocardiogenic    Past Surgical History:  Procedure Laterality Date   ABDOMINAL AORTOGRAM W/LOWER EXTREMITY Right 12/13/2020   Procedure: ABDOMINAL AORTOGRAM W/LOWER EXTREMITY;  Surgeon: Serafina Mitchell, MD;  Location: [place] CV LAB;  Service: Cardiovascular;  Laterality: Right;   ABDOMINAL HYSTERECTOMY      AMPUTATION Right 12/14/2020   Procedure: Right fifth toe amputation;  Surgeon: Rosetta Posner, MD;  Location: [facility] OR;  Service: Vascular;  Laterality: Right;   CATARACT EXTRACTION Bilateral    COLONOSCOPY N/A 10/07/2014   Dr. Oneida Alar: redundant left colon, moderate sized external hemorrhoids    COLONOSCOPY N/A 03/11/2020   Surgeon: Danie Binder, MD;  9 polyps, majority of which were tubular adenomas, nodular mucosa in the anus s/p biopsy which was benign.   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   ESOPHAGOGASTRODUODENOSCOPY (EGD) WITH PROPOFOL N/A 11/25/2020    Surgeon: Daneil Dolin, MD; Normal, s/p capsule deployment   [place] N/A 11/25/2020    Surgeon: Daneil Dolin, MD; 4/nonbleeding AVMs in the proximal small bowel large fresh blood, 2 bleeding lymphangiectasis in SB.    HOT HEMOSTASIS  11/27/2020   Procedure: HOT HEMOSTASIS (ARGON PLASMA COAGULATION/BICAP);  Surgeon: Montez Morita, Quillian Quince, MD;  [facility];  Service: Gastroenterology;;   LIPOMA RESECTION     PERIPHERAL VASCULAR INTERVENTION Right 12/13/2020   Procedure: PERIPHERAL VASCULAR INTERVENTION;  Surgeon: Serafina Mitchell, MD;  Location: [place] CV LAB;  Service: Cardiovascular;  Laterality: Right;  SFA/PT/AT   POLYPECTOMY  03/11/2020   Procedure: POLYPECTOMY;  Surgeon: Danie Binder, MD;  [facility]: [facility];  Service: Endoscopy;;  cecal, transverse, descending, sigmoid    Current Outpatient Medications  Medication Sig Dispense Refill   acetaminophen (TYLENOL) 500 MG tablet Take 1,000 mg by mouth every 6 (six) hours as needed for moderate pain.     albuterol (VENTOLIN HFA) 108 (90 Base) MCG/ACT inhaler Inhale 1-2 puffs into the lungs every 6 (six) hours as needed for wheezing or shortness of breath.      aspirin EC 81 MG EC tablet  Take 1 tablet (81 mg total) by mouth daily. Swallow whole. 30 tablet 11   cetirizine (ZYRTEC) 10 MG tablet Take 10 mg by mouth daily as needed for allergies.     Cholecalciferol (VITAMIN D-3) 25 MCG (1000 UT) CAPS Take 1 capsule by mouth daily.     cyclobenzaprine (FLEXERIL) 5 MG tablet Take 5 mg by mouth 3 (three) times daily as needed for muscle spasms.     diclofenac Sodium (VOLTAREN) 1 % GEL Apply 1 application topically 4 (four) times daily as needed (pain).     ELDERBERRY PO Take 2 capsules by mouth daily.      epoetin alfa-epbx (RETACRIT) 3000 UNIT/ML injection Inject 3,000 Units into the skin every 14 (fourteen) days.     feeding supplement (ENSURE ENLIVE / ENSURE PLUS) LIQD Take 237 mLs by mouth 2 (two) times daily between meals. 237 mL 12   ferrous sulfate 325 (65 FE) MG tablet Take 325 mg by mouth daily with breakfast.     flecainide (TAMBOCOR) 50 MG tablet Take 1 tablet (50 mg total) by mouth 2 (two) times daily. 60 tablet 3  fluticasone (FLONASE) 50 MCG/ACT nasal spray Place 1 spray into both nostrils daily as needed for allergies or rhinitis.     Fluticasone-Salmeterol (ADVAIR) 500-50 MCG/DOSE AEPB Inhale 1 puff into the lungs every 12 (twelve) hours.     furosemide (LASIX) 40 MG tablet Take 1 tablet (40 mg total) by mouth daily. 90 tablet 3   gabapentin (NEURONTIN) 300 MG capsule Take 300 mg by mouth 3 (three) times daily. As needed     losartan (COZAAR) 25 MG tablet Take 0.5 tablets (12.5 mg total) by mouth daily. (Patient taking differently: Take 25 mg by mouth daily.)     meclizine (ANTIVERT) 25 MG tablet Take 25 mg by mouth 3 (three) times daily as needed for dizziness.     Nutritional Supplements (,FEEDING SUPPLEMENT, PROSOURCE PLUS) liquid Take 30 mLs by mouth 2 (two) times daily between meals.     octreotide (SANDOSTATIN) 100 MCG/ML SOLN injection Inject 1 mL (100 mcg total) into the skin every 12 (twelve) hours. 60 mL 2   pantoprazole (PROTONIX) 40 MG  tablet TAKE 1 TABLET (40 MG TOTAL) BY MOUTH 2 (TWO) TIMES DAILY BEFORE A MEAL. 90 tablet 0   potassium chloride SA (KLOR-CON) 20 MEQ tablet Take 20 mEq by mouth in the morning, at noon, and at bedtime.     rosuvastatin (CRESTOR) 10 MG tablet TAKE 1 TABLET BY MOUTH EVERY DAY 30 tablet 0   vitamin C (ASCORBIC ACID) 250 MG tablet Take 500 mg by mouth daily.     apixaban (ELIQUIS) 5 MG TABS tablet Take 1 tablet (5 mg total) by mouth 2 (two) times daily. (Patient not taking: Reported on 02/23/2021) 180 tablet 3   lubiprostone (AMITIZA) 24 MCG capsule Take 1 capsule (24 mcg total) by mouth in the morning and at bedtime. 180 capsule 3   No current facility-administered medications for this visit.    Allergies as of 02/23/2021 - Review Complete 02/23/2021  Allergen Reaction Noted   Acetaminophen-codeine Hives    Amlodipine  08/03/2020   Amoxicillin Hives 03/04/2020   Doxycycline  11/23/2020   Acetaminophen-codeine Rash 09/16/2020   Allopurinol Hives and Rash    Tramadol Hives and Rash     Family History  Problem Relation Age of Onset   Stroke Mother    Dementia Father    Cancer - Other Sister    Atrial fibrillation Sister    Heart failure Sister    Colon cancer Maternal Grandmother        diagnosed in her 61s    Social History   Socioeconomic History   Marital status: Single    Spouse name: Not on file   Number of children: Not on file   Years of education: Not on file   Highest education level: Not on file  Occupational History   Occupation: Retired  Tobacco Use   Smoking status: Former Smoker    Packs/day: 1.00    Years: 20.00    Pack years: 20.00    Types: Cigarettes    Start date: 03/16/1964    Quit date: 10/07/1997    Years since quitting: 23.3   Smokeless tobacco: Never Used  Vaping Use   Vaping Use: Never used  Substance and Sexual Activity   Alcohol use: No    Alcohol/week: 0.0 standard drinks   Drug use: No   Sexual activity: Yes     Partners: Male  Other Topics Concern   Not on file  Social History Narrative   Not on file  Social Determinants of Health   Financial Resource Strain: Not on file  Food Insecurity: Not on file  Transportation Needs: Not on file  Physical Activity: Not on file  Stress: Not on file  Social Connections: Not on file    Review of Systems: Gen: Denies fever, chills, cold or flulike symptoms, lightheadedness, dizziness, presyncope, syncope. CV: Denies chest pain or palpitations. Resp: Denies dyspnea or cough. GI: See HPI Heme: See HPI  Physical Exam: BP (!) 146/58    Pulse (!) 58    Temp 97.7 F (36.5 C)    Ht 5\' 2"  (1.575 m)    Wt 154 lb 6.4 oz (70 kg)    BMI 28.24 kg/m  General:   Alert and oriented. No distress noted. Pleasant and cooperative.  Head:  Normocephalic and atraumatic. Eyes:  Conjuctiva clear without scleral icterus. Heart:  S1, S2 present without murmurs appreciated. Lungs:  Clear to auscultation bilaterally. No wheezes, rales, or rhonchi. No distress.  Abdomen:  +BS, soft, non-tender and non-distended. No rebound or guarding. No HSM or masses noted. Msk:  Symmetrical without gross deformities. Normal posture. Extremities:  Without edema. Neurologic:  Alert and  oriented x4 Psych: Normal mood and affect.  Assessment:  75 year old female with history of syncope, paroxysmal atrial fibrillationon Eliquis,hypertension, hyperlipidemia, grade 2 diastolic dysfunction,CKD, GERD, constipation, anemia in the setting of chronic disease and GI bleed secondary to [facility]. Hospitalized in early December 2021 with acute blood loss anemia and heme positive stool s/p EGD,capsule study, and undergoing enteroscopy 12/5 for multiple duodenal AVMs s/p APC ablation, few AVMs in proximal jejunum also ablated with APC. Prior colonoscopy March 2021 showed 9 polyps, majority of which were tubular adenomas. Repeat colonoscopy has been deferred due to age.  Hospitalized again in mid  December with acute on chronic anemia, heme positive stool without overt GI bleeding.  Suspected bleeding from AVMs in setting of Eliquis.  She started on octreotide 100 mcg twice daily subcu. She is also receiving retacrit and iron infusions with nephrology.  She had a couple episodes of bright red blood per rectum and black stools in early February.  She contacted cardiology, and Eliquis was discontinued; no overt GI bleeding since then.  Hemoglobin did decline to 6.7 on 2/24 and she received 2 units PRBCs on 2/25, no posttransfusion labs.  Today, she states she is feeling better than she has in a long time, gaining weight.  She has appointment lab work next week for Dr. Theador Hawthorne. Asked if I could place order for CBC to ensure this is completed as she isn't sure what he is checking.   Suspect melena and rectal bleeding from AMVs. However, per review of prior notes, does not seem patient has been reporting bright red blood per rectum.  Cannot rule out bleeding polyps or malignancy.  She has an appointment to see [facility] on 3/23 to discuss anemia, AVMs, and double-balloon enteroscopy.  Also has an appointment with cardiology 3/29.  As rectal bleeding has stopped, will hold off on arranging any additional procedures until she sees [facility].  Advised if she has recurrent bright red blood per rectum, we may need to consider a repeat colonoscopy.  She will notify us of any recurrent bleeding.  Constipation: Well-controlled on Amitiza 24 mcg twice daily.  1 year refill sent to pharmacy today.  GERD: Well-controlled on Protonix 40 mg twice daily.  No dysphagia.   Plan: 1.  Update CBC 2.  Continue octreotide 100 mcg twice daily. 3.  Continue Protonix 40 mg twice daily. 4.  Continue Amitiza 24 mcg twice daily.  1 year refill sent to pharmacy today. 5.  Keep upcoming appointment with [facility]. 6.  Keep upcoming appointment with cardiology.  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 7.  She will continue to avoid all NSAIDs aside from baby aspirin. 8.  Advised patient call our office if she has recurrent GI bleeding. 9.  Follow-up in 3 months with Dr. Abbey Chatters.   Aliene Altes, PA-C [facility] 02/23/2021

## 2021-02-23 ENCOUNTER — Encounter: Payer: Self-pay | Admitting: Gastroenterology

## 2021-02-23 ENCOUNTER — Other Ambulatory Visit: Payer: Self-pay | Admitting: Gastroenterology

## 2021-02-23 ENCOUNTER — Ambulatory Visit: Payer: Medicare HMO | Admitting: Gastroenterology

## 2021-02-23 ENCOUNTER — Other Ambulatory Visit: Payer: Self-pay

## 2021-02-23 VITALS — BP 146/58 | HR 58 | Temp 97.7°F | Ht 62.0 in | Wt 154.4 lb

## 2021-02-23 DIAGNOSIS — K5900 Constipation, unspecified: Secondary | ICD-10-CM

## 2021-02-23 DIAGNOSIS — K5521 Angiodysplasia of colon with hemorrhage: Secondary | ICD-10-CM | POA: Diagnosis not present

## 2021-02-23 DIAGNOSIS — D62 Acute posthemorrhagic anemia: Secondary | ICD-10-CM

## 2021-02-23 MED ORDER — LUBIPROSTONE 24 MCG PO CAPS: 24.0000 ug | ORAL_CAPSULE | Freq: Two times a day (BID) | ORAL | 3 refills | Status: DC

## 2021-02-23 NOTE — Patient Instructions (Addendum)
Keep your upcoming lab appointment for next week at [facility]. I am placing an order for a CBC.  Take this order with you to your lab appointment.  If Dr. Theador Hawthorne has not ordered a CBC, please have my lab order completed.  Continue octreotide 100 mcg twice daily.  Continue Protonix 40 mg twice daily 30 minutes before breakfast and dinner.  Continue Amitiza 24 mcg twice daily with meals.  Keep your upcoming appointment with [facility].  Keep upcoming appointment with cardiology.  We will plan to see you back in 3 months.  Please call if you have any return of bright red blood per rectum or black stools.  It was good to see you today!   Aliene Altes, PA-C [facility]

## 2021-03-02 ENCOUNTER — Encounter (HOSPITAL_COMMUNITY): Payer: Self-pay

## 2021-03-02 ENCOUNTER — Encounter (HOSPITAL_COMMUNITY)
Admission: RE | Admit: 2021-03-02 | Discharge: 2021-03-02 | Disposition: A | Discharge status: Home / Self Care | Payer: Medicare HMO | Source: Ambulatory Visit | Attending: Nephrology | Admitting: Nephrology

## 2021-03-02 ENCOUNTER — Other Ambulatory Visit: Payer: Self-pay

## 2021-03-02 DIAGNOSIS — K5521 Angiodysplasia of colon with hemorrhage: Secondary | ICD-10-CM | POA: Insufficient documentation

## 2021-03-02 DIAGNOSIS — D631 Anemia in chronic kidney disease: Secondary | ICD-10-CM | POA: Diagnosis not present

## 2021-03-02 DIAGNOSIS — D62 Acute posthemorrhagic anemia: Secondary | ICD-10-CM | POA: Diagnosis not present

## 2021-03-02 DIAGNOSIS — N185 Chronic kidney disease, stage 5: Secondary | ICD-10-CM | POA: Insufficient documentation

## 2021-03-02 LAB — CBC WITH DIFFERENTIAL/PLATELET
Abs Immature Granulocytes: 0.01 10*3/uL (ref 0.00–0.07)
Basophils Absolute: 0 10*3/uL (ref 0.0–0.1)
Basophils Relative: 1 %
Eosinophils Absolute: 0.4 10*3/uL (ref 0.0–0.5)
Eosinophils Relative: 6 %
HCT: 34.3 % — ABNORMAL LOW (ref 36.0–46.0)
Hemoglobin: 10.6 g/dL — ABNORMAL LOW (ref 12.0–15.0)
Immature Granulocytes: 0 %
Lymphocytes Relative: 41 %
Lymphs Abs: 2.5 10*3/uL (ref 0.7–4.0)
MCH: 30.2 pg (ref 26.0–34.0)
MCHC: 30.9 g/dL (ref 30.0–36.0)
MCV: 97.7 fL (ref 80.0–100.0)
Monocytes Absolute: 0.5 10*3/uL (ref 0.1–1.0)
Monocytes Relative: 8 %
Neutro Abs: 2.8 10*3/uL (ref 1.7–7.7)
Neutrophils Relative %: 44 %
Platelets: 251 10*3/uL (ref 150–400)
RBC: 3.51 MIL/uL — ABNORMAL LOW (ref 3.87–5.11)
RDW: 13.6 % (ref 11.5–15.5)
WBC: 6.2 10*3/uL (ref 4.0–10.5)
nRBC: 0 % (ref 0.0–0.2)

## 2021-03-02 LAB — RENAL FUNCTION PANEL
Albumin: 4 g/dL (ref 3.5–5.0)
Anion gap: 9 (ref 5–15)
BUN: 36 mg/dL — ABNORMAL HIGH (ref 8–23)
CO2: 28 mmol/L (ref 22–32)
Calcium: 9.3 mg/dL (ref 8.9–10.3)
Chloride: 102 mmol/L (ref 98–111)
Creatinine, Ser: 2.34 mg/dL — ABNORMAL HIGH (ref 0.44–1.00)
GFR, Estimated: 21 mL/min — ABNORMAL LOW (ref >60)
Glucose, Bld: 98 mg/dL (ref 70–99)
Phosphorus: 3.4 mg/dL (ref 2.5–4.6)
Potassium: 4.2 mmol/L (ref 3.5–5.1)
Sodium: 139 mmol/L (ref 135–145)

## 2021-03-02 LAB — VITAMIN D 25 HYDROXY (VIT D DEFICIENCY, FRACTURES): Vit D, 25-Hydroxy: 47.12 ng/mL (ref 30–100)

## 2021-03-02 LAB — IRON AND TIBC
Iron: 69 ug/dL (ref 28–170)
Saturation Ratios: 21 % (ref 10.4–31.8)
TIBC: 328 ug/dL (ref 250–450)
UIBC: 259 ug/dL

## 2021-03-02 LAB — POCT HEMOGLOBIN-HEMACUE: Hemoglobin: 10.2 g/dL — ABNORMAL LOW (ref 12.0–15.0)

## 2021-03-02 LAB — PROTEIN / CREATININE RATIO, URINE
Creatinine, Urine: 78.04 mg/dL
Protein Creatinine Ratio: 0.29 mg/mg{Cre} — ABNORMAL HIGH (ref 0.00–0.15)
Total Protein, Urine: 23 mg/dL

## 2021-03-02 MED ORDER — EPOETIN ALFA-EPBX 3000 UNIT/ML IJ SOLN: 3000.0000 [IU] | Freq: Once | INTRAMUSCULAR | Status: DC

## 2021-03-03 ENCOUNTER — Other Ambulatory Visit: Payer: Self-pay | Admitting: Cardiology

## 2021-03-03 ENCOUNTER — Other Ambulatory Visit: Payer: Self-pay | Admitting: *Deleted

## 2021-03-03 ENCOUNTER — Other Ambulatory Visit: Payer: Self-pay | Admitting: Internal Medicine

## 2021-03-03 DIAGNOSIS — N289 Disorder of kidney and ureter, unspecified: Secondary | ICD-10-CM

## 2021-03-03 LAB — PARATHYROID HORMONE, INTACT (NO CA): PTH: 69 pg/mL — ABNORMAL HIGH (ref 15–65)

## 2021-03-06 ENCOUNTER — Encounter: Payer: Self-pay | Admitting: Vascular Surgery

## 2021-03-06 ENCOUNTER — Other Ambulatory Visit: Payer: Self-pay

## 2021-03-06 ENCOUNTER — Ambulatory Visit (INDEPENDENT_AMBULATORY_CARE_PROVIDER_SITE_OTHER): Payer: Medicare HMO

## 2021-03-06 ENCOUNTER — Ambulatory Visit: Payer: Medicare HMO | Admitting: Vascular Surgery

## 2021-03-06 VITALS — BP 151/84 | HR 62 | Temp 97.8°F | Resp 12 | Ht 62.0 in | Wt 153.0 lb

## 2021-03-06 DIAGNOSIS — I739 Peripheral vascular disease, unspecified: Secondary | ICD-10-CM | POA: Diagnosis not present

## 2021-03-06 DIAGNOSIS — N289 Disorder of kidney and ureter, unspecified: Secondary | ICD-10-CM

## 2021-03-06 NOTE — Progress Notes (Signed)
[facility]ist [facility]  Patient name: [name] [name] MRN: [id number] DOB: August 03, 1946 Sex: female  REASON FOR VISIT: Discuss access for hemodialysis  Nephrologist: Dr. Theador Hawthorne  HPI: [name] [name] is a 75 y.o. female here today for discussion of access for hemodialysis.  She is here with her daughter.  She is well-known to me from prior peripheral disease.  She underwent arteriography and right SFA and anterior tibial angioplasty by Dr. Trula Slade on 12/13/2020.  She subsequently underwent amputation of gangrenous right toe by myself on 12/14/2020.  She has had good healing of this.  She does report some discomfort in her left fifth toe.  There is no evidence of ulceration or tissue loss.  She reports that this can happen once or twice per night and then she is able to go back to sleep.  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  She does have a history of atrial arrhythmia and has been on Eliquis but had bleeding complications and therefore is been discontinued.  There has been discussion of potential watchman procedure.  Past Medical History:  Diagnosis Date  . Anemia   . Chronic kidney disease   . Constipation 07/17/2017  . GERD (gastroesophageal reflux disease)   . Gout   . Grade II diastolic dysfunction 30/12/6008  . Hyperlipidemia   . Hypertension   . Hypokalemia   . OSA (obstructive sleep apnea) 08/09/2020  . PAF (paroxysmal atrial fibrillation) ([place])   . PAF (paroxysmal atrial fibrillation) ([place])   . PUD (peptic ulcer disease)   . Syncope    Neurocardiogenic    Family History  Problem Relation Age of Onset  . Stroke Mother   . Dementia Father   . Cancer - Other Sister   . Atrial fibrillation Sister   . Heart failure Sister   . Colon cancer Maternal Grandmother        diagnosed in her 74s    SOCIAL HISTORY: Social History   Tobacco Use  . Smoking status: Former Smoker    Packs/day:  1.00    Years: 20.00    Pack years: 20.00    Types: Cigarettes    Start date: 03/16/1964    Quit date: 10/07/1997    Years since quitting: 23.4  . Smokeless tobacco: Never Used  Substance Use Topics  . Alcohol use: No    Alcohol/week: 0.0 standard drinks    Allergies  Allergen Reactions  . Acetaminophen-Codeine Hives  . Amlodipine     Felt she retained fluid in her chest   . Amoxicillin Hives    Did it involve swelling of the face/tongue/throat, SOB, or low BP? No Did it involve sudden or severe rash/hives, skin peeling, or any reaction on the inside of your mouth or nose? No Did you need to seek medical attention at a hospital or doctor's office? No When did it last happen?10 + years If all above answers are "NO", may proceed with cephalosporin use.   Marland Kitchen Doxycycline     " sick and weak"  . Acetaminophen-Codeine Rash  . Allopurinol Hives and Rash  . Tramadol Hives and Rash    Current Outpatient Medications  Medication Sig Dispense Refill  . acetaminophen (TYLENOL) 500 MG tablet Take 1,000 mg by mouth every 6 (six) hours as needed for moderate pain.    Marland Kitchen albuterol (VENTOLIN HFA) 108 (90 Base) MCG/ACT inhaler Inhale 1-2 puffs into the lungs every 6 (six) hours as needed for  wheezing or shortness of breath.     Marland Kitchen aspirin EC 81 MG EC tablet Take 1 tablet (81 mg total) by mouth daily. Swallow whole. 30 tablet 11  . cetirizine (ZYRTEC) 10 MG tablet Take 10 mg by mouth daily as needed for allergies.    . Cholecalciferol (VITAMIN D-3) 25 MCG (1000 UT) CAPS Take 1 capsule by mouth daily.    . cyclobenzaprine (FLEXERIL) 5 MG tablet Take 5 mg by mouth 3 (three) times daily as needed for muscle spasms.    . diclofenac Sodium (VOLTAREN) 1 % GEL Apply 1 application topically 4 (four) times daily as needed (pain).    Marland Kitchen ELDERBERRY PO Take 2 capsules by mouth daily.     Marland Kitchen epoetin alfa-epbx (RETACRIT) 3000 UNIT/ML injection Inject 3,000 Units into the skin every 14 (fourteen) days.    .  feeding supplement (ENSURE ENLIVE / ENSURE PLUS) LIQD Take 237 mLs by mouth 2 (two) times daily between meals. 237 mL 12  . ferrous sulfate 325 (65 FE) MG tablet Take 325 mg by mouth daily with breakfast.    . flecainide (TAMBOCOR) 50 MG tablet TAKE 1 TABLET BY MOUTH TWICE A DAY 180 tablet 3  . fluticasone (FLONASE) 50 MCG/ACT nasal spray Place 1 spray into both nostrils daily as needed for allergies or rhinitis.    . Fluticasone-Salmeterol (ADVAIR) 500-50 MCG/DOSE AEPB Inhale 1 puff into the lungs every 12 (twelve) hours.    . gabapentin (NEURONTIN) 300 MG capsule Take 300 mg by mouth 3 (three) times daily. As needed    . losartan (COZAAR) 25 MG tablet Take 0.5 tablets (12.5 mg total) by mouth daily. (Patient taking differently: Take 25 mg by mouth daily.)    . lubiprostone (AMITIZA) 24 MCG capsule Take 1 capsule (24 mcg total) by mouth in the morning and at bedtime. 180 capsule 3  . meclizine (ANTIVERT) 25 MG tablet Take 25 mg by mouth 3 (three) times daily as needed for dizziness.    . Nutritional Supplements (,FEEDING SUPPLEMENT, PROSOURCE PLUS) liquid Take 30 mLs by mouth 2 (two) times daily between meals.    Marland Kitchen octreotide (SANDOSTATIN) 100 MCG/ML SOLN injection Inject 1 mL (100 mcg total) into the skin every 12 (twelve) hours. 60 mL 2  . pantoprazole (PROTONIX) 40 MG tablet TAKE 1 TABLET (40 MG TOTAL) BY MOUTH 2 (TWO) TIMES DAILY BEFORE A MEAL. 90 tablet 0  . potassium chloride SA (KLOR-CON) 20 MEQ tablet Take 20 mEq by mouth in the morning, at noon, and at bedtime.    . rosuvastatin (CRESTOR) 10 MG tablet TAKE 1 TABLET BY MOUTH EVERY DAY 30 tablet 0  . vitamin C (ASCORBIC ACID) 250 MG tablet Take 500 mg by mouth daily.    Marland Kitchen apixaban (ELIQUIS) 5 MG TABS tablet Take 1 tablet (5 mg total) by mouth 2 (two) times daily. (Patient not taking: No sig reported) 180 tablet 3  . furosemide (LASIX) 40 MG tablet Take 1 tablet (40 mg total) by mouth daily. 90 tablet 3   No current facility-administered  medications for this visit.    REVIEW OF SYSTEMS:  [X]  denotes positive finding, [ ]  denotes negative finding Cardiac  Comments:  Chest pain or chest pressure:    Shortness of breath upon exertion:    Short of breath when lying flat:    Irregular heart rhythm:        Vascular    Pain in calf, thigh, or hip brought on by ambulation:  Pain in feet at night that wakes you up from your sleep:     Blood clot in your veins:    Leg swelling:           PHYSICAL EXAM: Vitals:   03/06/21 1254  BP: (!) 151/84  Pulse: 62  Resp: 12  Temp: 97.8 F (36.6 C)  TempSrc: Other (Comment)  SpO2: 100%  Weight: 153 lb (69.4 kg)  Height: 5\' 2"  (1.575 m)    GENERAL: The patient is a well-nourished female, in no acute distress. The vital signs are documented above. CARDIOVASCULAR: 2+ radial pulses bilaterally.  Poor surface veins by physical exam bilaterally PULMONARY: There is good air exchange  MUSCULOSKELETAL: There are no major deformities or cyanosis. NEUROLOGIC: No focal weakness or paresthesias are detected. SKIN: There are no ulcers or rashes noted. PSYCHIATRIC: The patient has a normal affect.  DATA:  Noninvasive studies of her upper extremities today revealed normal arterial flow bilaterally.  She does have small to moderate cephalic and basilic veins bilaterally.  The left arm basilic vein is her best diameter vein.  I did review her old ankle arm indices and this was similar at approximately 0.5 bilaterally prior to her right SFA intervention.  Prior arteriogram was a CO2 study due to her renal insufficiency prior to her right leg intervention.  Her left leg revealed patency of her SFA but some moderate stenosis.  It appeared as though her popliteal was patent and she had a good anterior tibial runoff  MEDICAL ISSUES: Had long discussion with the patient and her daughter regarding options for hemodialysis.  I discussed tunnel catheter, AV graft and AV fistula.  She does appear to  be a candidate for fistula attempt of her left arm basilic vein.  Explained that we would reimage her veins at the time of surgery and elected for the best vein for fistula option.  Also discussed the limitations of prosthetic graft if she has failure of her fistula.  We will schedule her procedure as an outpatient at [facility] at their convenience over the next several weeks    Rosetta Posner, MD [facility] Tel [phone]  Note: Portions of this report may have been transcribed using voice recognition software.  Every effort has been made to ensure accuracy; however, inadvertent computerized transcription errors may still be present.

## 2021-03-07 ENCOUNTER — Other Ambulatory Visit: Payer: Self-pay

## 2021-03-09 ENCOUNTER — Ambulatory Visit: Payer: Medicare HMO | Admitting: Internal Medicine

## 2021-03-09 ENCOUNTER — Encounter: Payer: Self-pay | Admitting: Internal Medicine

## 2021-03-09 ENCOUNTER — Other Ambulatory Visit: Payer: Self-pay

## 2021-03-09 ENCOUNTER — Telehealth: Payer: Self-pay | Admitting: Internal Medicine

## 2021-03-09 VITALS — BP 140/76 | HR 72 | Ht 62.0 in | Wt 152.0 lb

## 2021-03-09 DIAGNOSIS — I4891 Unspecified atrial fibrillation: Secondary | ICD-10-CM | POA: Diagnosis not present

## 2021-03-09 NOTE — Telephone Encounter (Signed)
Patient said she was to let [name] [name] know if she saw more blood when she used the restroom and she said that she had right much in the toilet but it was bright red in color

## 2021-03-09 NOTE — Telephone Encounter (Signed)
Returned the pt's call and was advised by her that bleeding started yesterday and it was bright red in color. She states she had a headache the night before and when she stopped the blood thinner she had stopped her ASA as well but she went ahead and took a ASA for her headache and she thinks that is what started it. I asked her how she was today and she states she is still seeing bright red in the toilet after she uses the bathroom. Please advise

## 2021-03-09 NOTE — Progress Notes (Signed)
HPI [name] [name] returns today for followup of her atrial fib and HTN. She is a pleasant 75 yo woman with PAF, who I saw a couple of months ago. She was started on flecainide 75 mg twice daily but reduced to 50 mg twice daily. Her symptoms of atrial fib have essentially resolved. She denies chest pain or sob. She has an exercise test pending. She had some GI bleeding several weeks ago. She was taking ASA when this occurred. She has held her eliquis. She is pending placement of an AV fistula due to worsening renal insufficiency. She is at risk for stroke and is concerned about the Eliquis.  Allergies  Allergen Reactions  . Acetaminophen-Codeine Hives  . Amlodipine     Felt she retained fluid in her chest   . Amoxicillin Hives    Did it involve swelling of the face/tongue/throat, SOB, or low BP? No Did it involve sudden or severe rash/hives, skin peeling, or any reaction on the inside of your mouth or nose? No Did you need to seek medical attention at a hospital or doctor's office? No When did it last happen?10 + years If all above answers are "NO", may proceed with cephalosporin use.   Marland Kitchen Doxycycline     " sick and weak"  . Acetaminophen-Codeine Rash  . Allopurinol Hives and Rash  . Tramadol Hives and Rash     Current Outpatient Medications  Medication Sig Dispense Refill  . acetaminophen (TYLENOL) 500 MG tablet Take 1,000 mg by mouth every 6 (six) hours as needed for moderate pain.    Marland Kitchen albuterol (VENTOLIN HFA) 108 (90 Base) MCG/ACT inhaler Inhale 1-2 puffs into the lungs every 6 (six) hours as needed for wheezing or shortness of breath.     Marland Kitchen aspirin EC 81 MG EC tablet Take 1 tablet (81 mg total) by mouth daily. Swallow whole. 30 tablet 11  . cetirizine (ZYRTEC) 10 MG tablet Take 10 mg by mouth daily as needed for allergies.    . Cholecalciferol (VITAMIN D-3) 25 MCG (1000 UT) CAPS Take 1 capsule by mouth daily.    . cyclobenzaprine (FLEXERIL) 5 MG tablet Take 5 mg by mouth 3  (three) times daily as needed for muscle spasms.    . diclofenac Sodium (VOLTAREN) 1 % GEL Apply 1 application topically 4 (four) times daily as needed (pain).    Marland Kitchen ELDERBERRY PO Take 2 capsules by mouth daily.     Marland Kitchen epoetin alfa-epbx (RETACRIT) 3000 UNIT/ML injection Inject 3,000 Units into the skin every 14 (fourteen) days.    . feeding supplement (ENSURE ENLIVE / ENSURE PLUS) LIQD Take 237 mLs by mouth 2 (two) times daily between meals. 237 mL 12  . ferrous sulfate 325 (65 FE) MG tablet Take 325 mg by mouth daily with breakfast.    . flecainide (TAMBOCOR) 50 MG tablet TAKE 1 TABLET BY MOUTH TWICE A DAY 180 tablet 3  . fluticasone (FLONASE) 50 MCG/ACT nasal spray Place 1 spray into both nostrils daily as needed for allergies or rhinitis.    . Fluticasone-Salmeterol (ADVAIR) 500-50 MCG/DOSE AEPB Inhale 1 puff into the lungs every 12 (twelve) hours.    . furosemide (LASIX) 40 MG tablet Take 1 tablet (40 mg total) by mouth daily. 90 tablet 3  . gabapentin (NEURONTIN) 300 MG capsule Take 300 mg by mouth 3 (three) times daily. As needed    . losartan (COZAAR) 25 MG tablet Take 0.5 tablets (12.5 mg total) by mouth daily. (  Patient taking differently: Take 25 mg by mouth daily.)    . lubiprostone (AMITIZA) 24 MCG capsule Take 1 capsule (24 mcg total) by mouth in the morning and at bedtime. 180 capsule 3  . meclizine (ANTIVERT) 25 MG tablet Take 25 mg by mouth 3 (three) times daily as needed for dizziness.    . Nutritional Supplements (,FEEDING SUPPLEMENT, PROSOURCE PLUS) liquid Take 30 mLs by mouth 2 (two) times daily between meals.    Marland Kitchen octreotide (SANDOSTATIN) 100 MCG/ML SOLN injection Inject 1 mL (100 mcg total) into the skin every 12 (twelve) hours. 60 mL 2  . pantoprazole (PROTONIX) 40 MG tablet TAKE 1 TABLET (40 MG TOTAL) BY MOUTH 2 (TWO) TIMES DAILY BEFORE A MEAL. 90 tablet 0  . potassium chloride SA (KLOR-CON) 20 MEQ tablet Take 20 mEq by mouth in the morning, at noon, and at bedtime.    .  rosuvastatin (CRESTOR) 10 MG tablet TAKE 1 TABLET BY MOUTH EVERY DAY 30 tablet 0  . vitamin C (ASCORBIC ACID) 250 MG tablet Take 500 mg by mouth daily.    Marland Kitchen apixaban (ELIQUIS) 5 MG TABS tablet Take 1 tablet (5 mg total) by mouth 2 (two) times daily. (Patient not taking: No sig reported) 180 tablet 3   No current facility-administered medications for this visit.     Past Medical History:  Diagnosis Date  . Anemia   . Chronic kidney disease   . Constipation 07/17/2017  . GERD (gastroesophageal reflux disease)   . Gout   . Grade II diastolic dysfunction 42/05/8340  . Hyperlipidemia   . Hypertension   . Hypokalemia   . OSA (obstructive sleep apnea) 08/09/2020  . PAF (paroxysmal atrial fibrillation) ([place])   . PAF (paroxysmal atrial fibrillation) ([place])   . PUD (peptic ulcer disease)   . Syncope    Neurocardiogenic    ROS:   All systems reviewed and negative except as noted in the HPI.   Past Surgical History:  Procedure Laterality Date  . ABDOMINAL AORTOGRAM W/LOWER EXTREMITY Right 12/13/2020   Procedure: ABDOMINAL AORTOGRAM W/LOWER EXTREMITY;  Surgeon: Serafina Mitchell, MD;  Location: [place] [facility];  Service: Cardiovascular;  Laterality: Right;  . ABDOMINAL HYSTERECTOMY    . AMPUTATION Right 12/14/2020   Procedure: Right fifth toe amputation;  Surgeon: Rosetta Posner, MD;  Location: [place];  Service: Vascular;  Laterality: Right;  . CATARACT EXTRACTION Bilateral   . COLONOSCOPY N/A 10/07/2014   Dr. Oneida Alar: redundant left colon, moderate sized external hemorrhoids   . COLONOSCOPY N/A 03/11/2020   Surgeon: Danie Binder, MD;  9 polyps, majority of which were tubular adenomas, nodular mucosa in the anus s/p biopsy which was benign.  . 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  . ESOPHAGOGASTRODUODENOSCOPY (EGD) WITH PROPOFOL N/A  11/25/2020    Surgeon: Daneil Dolin, MD; Normal, s/p capsule deployment  . 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   Marland Kitchen HOT HEMOSTASIS  11/27/2020   Procedure: HOT HEMOSTASIS (ARGON PLASMA COAGULATION/BICAP);  Surgeon: Montez Morita, Quillian Quince, MD;  [facility];  Service: Gastroenterology;;  . LIPOMA RESECTION    . PERIPHERAL VASCULAR INTERVENTION Right 12/13/2020   Procedure: PERIPHERAL VASCULAR INTERVENTION;  Surgeon: Serafina Mitchell, MD;  Location:  [facility];  Service: Cardiovascular;  Laterality: Right;  SFA/PT/AT  . POLYPECTOMY  03/11/2020   Procedure: POLYPECTOMY;  Surgeon: Danie Binder, MD;  [facility];  Service: Endoscopy;;  cecal, transverse, descending, sigmoid     Family History  Problem Relation Age of Onset  . Stroke Mother   . Dementia Father   . Cancer - Other Sister   . Atrial fibrillation Sister   . Heart failure Sister   . Colon cancer Maternal Grandmother        diagnosed in her 62s     Social History   Socioeconomic History  . Marital status: Single    Spouse name: Not on file  . Number of children: Not on file  . Years of education: Not on file  . Highest education level: Not on file  Occupational History  . Occupation: Retired  Tobacco Use  . Smoking status: Former Smoker    Packs/day: 1.00    Years: 20.00    Pack years: 20.00    Types: Cigarettes    Start date: 03/16/1964    Quit date: 10/07/1997    Years since quitting: 23.4  . Smokeless tobacco: Never Used  Vaping Use  . Vaping Use: Never used  Substance and Sexual Activity  . Alcohol use: No    Alcohol/week: 0.0 standard drinks  . Drug use: No  . Sexual activity: Yes    Partners: Male  Other Topics Concern  . Not on file  Social History Narrative  . Not on file   Social Determinants of Health   Financial Resource  Strain: Not on file  Food Insecurity: Not on file  Transportation Needs: Not on file  Physical Activity: Not on file  Stress: Not on file  Social Connections: Not on file  Intimate Partner Violence: Not on file     BP 140/76   Pulse 72   Ht 5\' 2"  (1.575 m)   Wt 152 lb (68.9 kg)   SpO2 99%   BMI 27.80 kg/m   Physical Exam:  Well appearing NAD HEENT: Unremarkable Neck:  No JVD, no thyromegally Lymphatics:  No adenopathy Back:  No CVA tenderness Lungs:  Clear with no wheezes HEART:  Regular rate rhythm, no murmurs, no rubs, no clicks Abd:  soft, positive bowel sounds, no organomegally, no rebound, no guarding Ext:  2 plus pulses, no edema, no cyanosis, no clubbing Skin:  No rashes no nodules Neuro:  CN II through XII intact, motor grossly intact  EKG  DEVICE  Normal device function.  See PaceArt for details.   Assess/Plan: 1. Atrial fib - her symptoms are much improved on flecainide. She will continue her current meds at 50 bid. 2. HTN - her bp is slightly up. She is encouraged to avoid salty foods.  3. GI bleeding - she was prescribed octreotide and just started to take it.  She has not had more bleeding and is on eliquis. She had a clot in her toe and had it amputated. I would like to have her see Dr. Quentin Ore to consider Watchman. 4. Anemia  - her blood counts are improving. I told her to restart the eliquis when her GI bleeding resolves.  Carleene Overlie Samari Bittinger,MD

## 2021-03-09 NOTE — Patient Instructions (Signed)
Medication Instructions:  Your physician recommends that you continue on your current medications as directed. Please refer to the Current Medication list given to you today.  Restart Eliquis when bleeding stops.   *If you need a refill on your cardiac medications before your next appointment, please call your pharmacy*   Lab Work: NONE   If you have labs (blood work) drawn today and your tests are completely normal, you will receive your results only by: Marland Kitchen MyChart Message (if you have MyChart) OR . A paper copy in the mail If you have any lab test that is abnormal or we need to change your treatment, we will call you to review the results.   Testing/Procedures: NONE    Follow-Up: At [facility], you and your health needs are our priority.  As part of our continuing mission to provide you with exceptional heart care, we have created designated Provider Care Teams.  These Care Teams include your primary Cardiologist (physician) and Advanced Practice Providers (APPs -  Physician Assistants and Nurse Practitioners) who all work together to provide you with the care you need, when you need it.  We recommend signing up for the patient portal called "MyChart".  Sign up information is provided on this After Visit Summary.  MyChart is used to connect with patients for Virtual Visits (Telemedicine).  Patients are able to view lab/test results, encounter notes, upcoming appointments, etc.  Non-urgent messages can be sent to your provider as well.   To learn more about what you can do with MyChart, go to [email or web address].    Your next appointment:    To Be Determined   The format for your next appointment:   In Person  Provider:   Dr. Quentin Ore   Other Instructions Thank you for choosing [facility]!

## 2021-03-09 NOTE — Telephone Encounter (Addendum)
Patient last seen by [facility] on 02/23/21. And saw Abbey Chatters before that. Seeing this message after hours.   Spoke with patient. She has had four episodes of brbpr in the toilet water/tissue. No abdominal pain, rectal pain. No lightheadedness, DOE, etc. ED precautions discussed.   She saw Dr. Lovena Le today and per patient was advised to take Eliquis "when bleeding stops" and then if she sees more blood then hold Eliquis. She is scheduled to go discuss Watchman with Dr. Quentin Ore.   I HAVE TOLD HER THAT KRISTEN WILL GET BACK WITH HER WITH FURTHER INSTRUCTIONS SHE SHE LAST SAW PATIENT.

## 2021-03-10 ENCOUNTER — Other Ambulatory Visit: Payer: Self-pay | Admitting: Internal Medicine

## 2021-03-10 ENCOUNTER — Other Ambulatory Visit: Payer: Self-pay | Admitting: Gastroenterology

## 2021-03-10 ENCOUNTER — Other Ambulatory Visit: Payer: Self-pay | Admitting: Family Medicine

## 2021-03-10 ENCOUNTER — Telehealth: Payer: Self-pay

## 2021-03-10 DIAGNOSIS — K625 Hemorrhage of anus and rectum: Secondary | ICD-10-CM

## 2021-03-10 DIAGNOSIS — D649 Anemia, unspecified: Secondary | ICD-10-CM

## 2021-03-10 LAB — CBC WITH DIFFERENTIAL/PLATELET
Absolute Monocytes: 397 cells/uL (ref 200–950)
Basophils Absolute: 50 cells/uL (ref 0–200)
Basophils Relative: 0.8 %
Eosinophils Absolute: 211 cells/uL (ref 15–500)
Eosinophils Relative: 3.4 %
HCT: 31.5 % — ABNORMAL LOW (ref 35.0–45.0)
Hemoglobin: 10.2 g/dL — ABNORMAL LOW (ref 11.7–15.5)
Lymphs Abs: 1711 cells/uL (ref 850–3900)
MCH: 29.7 pg (ref 27.0–33.0)
MCHC: 32.4 g/dL (ref 32.0–36.0)
MCV: 91.8 fL (ref 80.0–100.0)
MPV: 10.2 fL (ref 7.5–12.5)
Monocytes Relative: 6.4 %
Neutro Abs: 3832 cells/uL (ref 1500–7800)
Neutrophils Relative %: 61.8 %
Platelets: 264 10*3/uL (ref 140–400)
RBC: 3.43 10*6/uL — ABNORMAL LOW (ref 3.80–5.10)
RDW: 12.6 % (ref 11.0–15.0)
Total Lymphocyte: 27.6 %
WBC: 6.2 10*3/uL (ref 3.8–10.8)

## 2021-03-10 MED ORDER — HYDROCORTISONE (PERIANAL) 2.5 % EX CREA: 1.0000 "application " | TOPICAL_CREAM | Freq: Two times a day (BID) | CUTANEOUS | 1 refills | Status: DC

## 2021-03-10 NOTE — Telephone Encounter (Signed)
noted 

## 2021-03-10 NOTE — Telephone Encounter (Signed)
Spoke with patient this morning.   She has had a total of 5 episodes of bright red blood per rectum since Wednesday with the last episode around 7pm. Blood was in toilet water and on toilet tissue. None in the stool. States she has been constipated as she is out of Amitiza and the pharmacy will not fill her Rx. States they need a PA. Denies rectal pain, lightheadedness, dizziness, weakness, SOB, CP, etc. Denies melena. She is still holding Eliquis at this time and hasn't taken any more aspirin.   I have recommended STAT CBC today as she has had transfusion dependent anemia. She will have this completed at [facility].  If any significant rectal bleeding, lightheadedness, dizziness etc over the weekend, she is to proceed to the emergency room.   I will also plan to discuss with Dr. Abbey Chatters when he returns early next week. May need to consider updating a colonoscopy considering bright red blood per rectum is fairly new. She has upcoming appointment this month with [facility] to discuss anemia and possible double balloon enteroscopy.     Dena: Patient needs PA completed for Amitiza. I sent a Rx at her last visit.

## 2021-03-10 NOTE — Telephone Encounter (Signed)
Phone call received from [facility] office [phone], requesting to cancel pts surgery with Dr.Early on 03/30/21 since her creatinine is better and reschedule her 8 weeks out.   Spoke with pt. Rescheduled surgery for 05/04/21. Instructions reviewed and updated letter to be mailed.

## 2021-03-13 ENCOUNTER — Other Ambulatory Visit: Payer: Self-pay | Admitting: Family Medicine

## 2021-03-16 ENCOUNTER — Other Ambulatory Visit: Payer: Self-pay | Admitting: Family Medicine

## 2021-03-16 DIAGNOSIS — K5520 Angiodysplasia of colon without hemorrhage: Secondary | ICD-10-CM | POA: Insufficient documentation

## 2021-03-17 ENCOUNTER — Encounter (HOSPITAL_COMMUNITY): Admission: RE | Admit: 2021-03-17 | Payer: Medicare HMO | Source: Ambulatory Visit

## 2021-03-21 ENCOUNTER — Other Ambulatory Visit: Payer: Self-pay

## 2021-03-21 ENCOUNTER — Encounter (HOSPITAL_COMMUNITY)
Admission: RE | Admit: 2021-03-21 | Discharge: 2021-03-21 | Disposition: A | Discharge status: Home / Self Care | Payer: Medicare HMO | Source: Ambulatory Visit | Attending: Nephrology | Admitting: Nephrology

## 2021-03-21 ENCOUNTER — Ambulatory Visit: Payer: Medicare HMO | Admitting: Internal Medicine

## 2021-03-21 ENCOUNTER — Encounter (HOSPITAL_COMMUNITY): Payer: Self-pay

## 2021-03-21 DIAGNOSIS — K5900 Constipation, unspecified: Secondary | ICD-10-CM

## 2021-03-21 DIAGNOSIS — K625 Hemorrhage of anus and rectum: Secondary | ICD-10-CM

## 2021-03-21 DIAGNOSIS — D649 Anemia, unspecified: Secondary | ICD-10-CM

## 2021-03-21 DIAGNOSIS — N185 Chronic kidney disease, stage 5: Secondary | ICD-10-CM | POA: Diagnosis not present

## 2021-03-21 MED ORDER — EPOETIN ALFA-EPBX 3000 UNIT/ML IJ SOLN
3000.0000 [IU] | Freq: Once | INTRAMUSCULAR | Status: AC
  Administered 2021-03-21: 3000 [IU] via SUBCUTANEOUS
  Filled 2021-03-21: qty 1

## 2021-03-27 ENCOUNTER — Other Ambulatory Visit: Payer: Self-pay | Admitting: Internal Medicine

## 2021-03-27 ENCOUNTER — Encounter (HOSPITAL_COMMUNITY): Payer: Medicare HMO

## 2021-03-28 ENCOUNTER — Other Ambulatory Visit (HOSPITAL_COMMUNITY): Payer: Medicare HMO

## 2021-03-30 ENCOUNTER — Encounter: Payer: Self-pay | Admitting: Cardiology

## 2021-03-30 ENCOUNTER — Other Ambulatory Visit: Payer: Self-pay

## 2021-03-30 ENCOUNTER — Ambulatory Visit: Payer: Medicare HMO | Admitting: Cardiology

## 2021-03-30 VITALS — BP 136/60 | HR 69 | Ht 62.0 in | Wt 156.2 lb

## 2021-03-30 DIAGNOSIS — D62 Acute posthemorrhagic anemia: Secondary | ICD-10-CM | POA: Diagnosis not present

## 2021-03-30 DIAGNOSIS — N184 Chronic kidney disease, stage 4 (severe): Secondary | ICD-10-CM

## 2021-03-30 DIAGNOSIS — K922 Gastrointestinal hemorrhage, unspecified: Secondary | ICD-10-CM | POA: Diagnosis not present

## 2021-03-30 DIAGNOSIS — I480 Paroxysmal atrial fibrillation: Secondary | ICD-10-CM | POA: Diagnosis not present

## 2021-03-30 DIAGNOSIS — K625 Hemorrhage of anus and rectum: Secondary | ICD-10-CM | POA: Diagnosis not present

## 2021-03-30 NOTE — Patient Instructions (Addendum)
Medication Instructions:  Your physician recommends that you continue on your current medications as directed. Please refer to the Current Medication list given to you today. *If you need a refill on your cardiac medications before your next appointment, please call your pharmacy*  Lab Work: None ordered. If you have labs (blood work) drawn today and your tests are completely normal, you will receive your results only by: Marland Kitchen MyChart Message (if you have MyChart) OR . A paper copy in the mail If you have any lab test that is abnormal or we need to change your treatment, we will call you to review the results.  Testing/Procedures: Your physician has requested that you have an echocardiogram. Echocardiography is a painless test that uses sound waves to create images of your heart. It provides your doctor with information about the size and shape of your heart and how well your heart's chambers and valves are working. This procedure takes approximately one hour. There are no restrictions for this procedure.  You will have an ECHO at [facility] has requested that you have a TEE. During a TEE, sound waves are used to create images of your heart. It provides your doctor with information about the size and shape of your heart and how well your heart's chambers and valves are working. In this test, a transducer is attached to the end of a flexible tube that's guided down your throat and into your esophagus (the tube leading from you mouth to your stomach) to get a more detailed image of your heart. You are not awake for the procedure. Please see the instruction sheet given to you today. For further information please visit [email or web address].  You are scheduled for a TEE on May 10, 2021  Follow-Up:  Your next appointment will be at the afib clinic:  AFIB CLINIC INFORMATION: Your appointment is scheduled on: ____________ at ____________ The AFib Clinic is located in the Heart  and Vascular Specialty Clinics at [facility]. Parking instructions/directions: [address] (off [address]). When you pull in to Entrance C,  there is an underground parking garage to your right. The code to enter the garage is _________________. Take the  elevators to the first floor. Follow the signs to the Heart and Vascular Specialty Clinics. You will see  registration at the end of the hallway.  Phone number: [phone]   Left Atrial Appendage Closure Device Implantation  Left atrial appendage (LAA) closure device implantation is a procedure to put a small device in the LAA of the heart. The LAA is a small sac in the wall of the heart's left upper chamber. Blood clots can form in the LAA in people with atrial fibrillation (AFib). The device closes the LAA to help prevent a blood clot and stroke.

## 2021-03-30 NOTE — Progress Notes (Signed)
Electrophysiology Office Note:    Date:  03/30/2021   ID:  Mylee, Falin 07/14/1946, MRN [id number]  PCP:  Andres Shad, MD  [facility] Cardiologist:  Rozann Lesches, MD  [facility] Electrophysiologist:  None   Referring MD: Evans Lance, MD   Chief Complaint: Atrial fibrillation and history of GI bleeding  History of Present Illness:    BREYAH AKHTER is a 75 y.o. female who presents for an evaluation of atrial fibrillation and history of GI bleeding at the request of Dr. Lovena Le. Their medical history includes GERD, chronic kidney disease stage IV, peptic ulcer disease, obstructive sleep apnea, hypertension.  The patient is in clinic today with her niece.  The patient was last seen by Dr. Lovena Le March 09, 2021.  Ms. Brierley is maintained on flecainide twice daily for her atrial fibrillation.  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  She is also had a small bowel capsule study which showed AVMs.  She is currently back on her anticoagulant and tolerating it.  Her chronic kidney disease seems to be stable at the moment.  Plans to create an AV fistula have been postponed because of stable creatinine.  She is very interested in pursuing a stroke prophylaxis strategy that avoid long-term exposure to anticoagulation.  Past Medical History:  Diagnosis Date  . Anemia   . Chronic kidney disease   . Constipation 07/17/2017  . GERD (gastroesophageal reflux disease)   . Gout   . Grade II diastolic dysfunction 36/03/6802  . Hyperlipidemia   . Hypertension   . Hypokalemia   . OSA (obstructive sleep apnea) 08/09/2020  . PAF (paroxysmal atrial fibrillation) ([place])   . PAF (paroxysmal atrial fibrillation) ([place])   . PUD (peptic ulcer disease)   . Syncope    Neurocardiogenic    Past Surgical History:  Procedure Laterality Date  . ABDOMINAL AORTOGRAM W/LOWER EXTREMITY Right 12/13/2020    Procedure: ABDOMINAL AORTOGRAM W/LOWER EXTREMITY;  Surgeon: Serafina Mitchell, MD;  Location: [place] [facility];  Service: Cardiovascular;  Laterality: Right;  . ABDOMINAL HYSTERECTOMY    . AMPUTATION Right 12/14/2020   Procedure: Right fifth toe amputation;  Surgeon: Rosetta Posner, MD;  Location: [place];  Service: Vascular;  Laterality: Right;  . CATARACT EXTRACTION Bilateral   . COLONOSCOPY N/A 10/07/2014   Dr. Oneida Alar: redundant left colon, moderate sized external hemorrhoids   . COLONOSCOPY N/A 03/11/2020   Surgeon: Danie Binder, MD;  9 polyps, majority of which were tubular adenomas, nodular mucosa in the anus s/p biopsy which was benign.  . 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  . ESOPHAGOGASTRODUODENOSCOPY (EGD) WITH PROPOFOL N/A 11/25/2020    Surgeon: Daneil Dolin, MD; Normal, s/p capsule deployment  . 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   Marland Kitchen HOT HEMOSTASIS  11/27/2020   Procedure: HOT HEMOSTASIS (ARGON PLASMA COAGULATION/BICAP);  Surgeon: Montez Morita, Quillian Quince, MD;  [facility];  Service: Gastroenterology;;  . LIPOMA RESECTION    . PERIPHERAL VASCULAR INTERVENTION Right 12/13/2020   Procedure: PERIPHERAL VASCULAR INTERVENTION;  Surgeon: Serafina Mitchell, MD;  Location: [place] [facility];  Service: Cardiovascular;  Laterality:  Right;  SFA/PT/AT  . POLYPECTOMY  03/11/2020   Procedure: POLYPECTOMY;  Surgeon: Danie Binder, MD;  [facility];  Service: Endoscopy;;  cecal, transverse, descending, sigmoid    Current Medications: Current Meds  Medication Sig  . acetaminophen (TYLENOL) 500 MG tablet Take 1,000 mg by mouth every 6 (six) hours as needed for moderate pain.  Marland Kitchen albuterol  (VENTOLIN HFA) 108 (90 Base) MCG/ACT inhaler Inhale 1-2 puffs into the lungs every 6 (six) hours as needed for wheezing or shortness of breath.   Marland Kitchen apixaban (ELIQUIS) 5 MG TABS tablet Take 1 tablet (5 mg total) by mouth 2 (two) times daily.  Marland Kitchen aspirin EC 81 MG EC tablet Take 1 tablet (81 mg total) by mouth daily. Swallow whole.  . cetirizine (ZYRTEC) 10 MG tablet Take 10 mg by mouth daily as needed for allergies.  . Cholecalciferol (VITAMIN D-3) 25 MCG (1000 UT) CAPS Take 1 capsule by mouth daily.  . colchicine 0.6 MG tablet Take 1 tablet by mouth as needed.  . cyclobenzaprine (FLEXERIL) 5 MG tablet Take 5 mg by mouth 3 (three) times daily as needed for muscle spasms.  . cyproheptadine (PERIACTIN) 4 MG tablet Take 1 tablet by mouth as needed.  . diclofenac Sodium (VOLTAREN) 1 % GEL Apply 1 application topically 4 (four) times daily as needed (pain).  Marland Kitchen ELDERBERRY PO Take 2 capsules by mouth daily.   Marland Kitchen epoetin alfa-epbx (RETACRIT) 3000 UNIT/ML injection Inject 3,000 Units into the skin every 14 (fourteen) days.  . feeding supplement (ENSURE ENLIVE / ENSURE PLUS) LIQD Take 237 mLs by mouth 2 (two) times daily between meals.  . ferrous sulfate 325 (65 FE) MG tablet Take 325 mg by mouth daily with breakfast.  . flecainide (TAMBOCOR) 50 MG tablet TAKE 1 TABLET BY MOUTH TWICE A DAY  . fluticasone (FLONASE) 50 MCG/ACT nasal spray Place 1 spray into both nostrils daily as needed for allergies or rhinitis.  . Fluticasone-Salmeterol (ADVAIR) 500-50 MCG/DOSE AEPB Inhale 1 puff into the lungs every 12 (twelve) hours.  . furosemide (LASIX) 40 MG tablet TAKE 1 TABLET BY MOUTH EVERY DAY  . gabapentin (NEURONTIN) 300 MG capsule Take 300 mg by mouth 3 (three) times daily. As needed  . hydrocortisone (ANUSOL-HC) 2.5 % rectal cream Place 1 application rectally 2 (two) times daily.  Marland Kitchen losartan (COZAAR) 25 MG tablet Take 1 tablet by mouth daily.  Marland Kitchen lubiprostone (AMITIZA) 24 MCG capsule Take 1 capsule (24 mcg total)  by mouth in the morning and at bedtime.  . meclizine (ANTIVERT) 25 MG tablet Take 25 mg by mouth 3 (three) times daily as needed for dizziness.  . Nutritional Supplements (,FEEDING SUPPLEMENT, PROSOURCE PLUS) liquid Take 30 mLs by mouth 2 (two) times daily between meals.  Marland Kitchen octreotide (SANDOSTATIN) 100 MCG/ML SOLN injection INJECT 1 ML UNDER THE SKIN EVERY 12 HOURS.  Marland Kitchen pantoprazole (PROTONIX) 40 MG tablet TAKE 1 TABLET (40 MG TOTAL) BY MOUTH 2 (TWO) TIMES DAILY BEFORE A MEAL.  Marland Kitchen potassium chloride SA (KLOR-CON) 20 MEQ tablet Take 20 mEq by mouth in the morning, at noon, and at bedtime.  . rosuvastatin (CRESTOR) 10 MG tablet TAKE 1 TABLET BY MOUTH DAILY  . vitamin C (ASCORBIC ACID) 250 MG tablet Take 500 mg by mouth daily.  . [DISCONTINUED] losartan (COZAAR) 25 MG tablet Take 0.5 tablets (12.5 mg total) by mouth daily.     Allergies:   Acetaminophen-codeine, Amlodipine, Amoxicillin, Doxycycline, Acetaminophen-codeine, Allopurinol, and Tramadol   Social History  Socioeconomic History  . Marital status: Single    Spouse name: Not on file  . Number of children: Not on file  . Years of education: Not on file  . Highest education level: Not on file  Occupational History  . Occupation: Retired  Tobacco Use  . Smoking status: Former Smoker    Packs/day: 1.00    Years: 20.00    Pack years: 20.00    Types: Cigarettes    Start date: 03/16/1964    Quit date: 10/07/1997    Years since quitting: 23.4  . Smokeless tobacco: Never Used  Vaping Use  . Vaping Use: Never used  Substance and Sexual Activity  . Alcohol use: No    Alcohol/week: 0.0 standard drinks  . Drug use: No  . Sexual activity: Yes    Partners: Male  Other Topics Concern  . Not on file  Social History Narrative  . Not on file   Social Determinants of Health   Financial Resource Strain: Not on file  Food Insecurity: Not on file  Transportation Needs: Not on file  Physical Activity: Not on file  Stress: Not on file   Social Connections: Not on file     Family History: The patient's family history includes Atrial fibrillation in her sister; Cancer - Other in her sister; Colon cancer in her maternal grandmother; Dementia in her father; Heart failure in her sister; Stroke in her mother.  ROS:   Please see the history of present illness.    All other systems reviewed and are negative.  EKGs/Labs/Other Studies Reviewed:    The following studies were reviewed today:  May 05, 2020 echo personally reviewed Left ventricular function normal, 65% Right ventricular function normal Dilated left atrium Nodular MV calcification   EKG:  The ekg ordered today demonstrates sinus rhythm.  PR interval is 192 ms.  QRS duration is 128 ms.  Recent Labs: 05/05/2020: TSH 0.893 10/20/2020: B Natriuretic Peptide 893.0 12/06/2020: ALT 10 12/08/2020: Magnesium 2.2 03/02/2021: BUN 36; Creatinine, Ser 2.34; Potassium 4.2; Sodium 139 03/10/2021: Hemoglobin 10.2; Platelets 264  Recent Lipid Panel No results found for: CHOL, TRIG, HDL, CHOLHDL, VLDL, LDLCALC, LDLDIRECT  Physical Exam:    VS:  BP 136/60   Pulse 69   Ht 5\' 2"  (1.575 m)   Wt 156 lb 3.2 oz (70.9 kg)   SpO2 100%   BMI 28.57 kg/m     Wt Readings from Last 3 Encounters:  03/30/21 156 lb 3.2 oz (70.9 kg)  03/09/21 152 lb (68.9 kg)  03/06/21 153 lb (69.4 kg)     GEN:  Well nourished, well developed in no acute distress HEENT: Normal NECK: No JVD; No carotid bruits LYMPHATICS: No lymphadenopathy CARDIAC: RRR, no murmurs, rubs, gallops RESPIRATORY:  Clear to auscultation without rales, wheezing or rhonchi  ABDOMEN: Soft, non-tender, non-distended MUSCULOSKELETAL:  No edema; No deformity  SKIN: Warm and dry NEUROLOGIC:  Alert and oriented x 3 PSYCHIATRIC:  Normal affect   ASSESSMENT:    1. Paroxysmal atrial fibrillation (HCC)   2. Rectal bleeding   3. Acute blood loss anemia   4. Upper GI bleed   5. Stage 4 chronic kidney disease ([place])     PLAN:    In order of problems listed above:  1. Paroxysmal atrial fibrillation Maintaining sinus rhythm on flecainide.  On Eliquis for stroke prophylaxis.  She has had a long history of GI bleeding with AVMs and hemorrhoidal bleeding.  She would like to pursue a stroke prophylaxis  strategy that avoids long-term exposure to anticoagulation.  I do think that she is a candidate for the watchman procedure.  Given her chronic kidney disease, preprocedural imaging will need to be performed with a transesophageal echocardiogram.  We discussed the watchman procedure in detail with the patient and her niece during today's appointment including the risks, expected recovery time and need for short-term anticoagulation after watchman implant.  I have seen Elam Dutch in the office today who is being considered for a Watchman left atrial appendage closure device. I believe they will benefit from this procedure given their history of atrial fibrillation, CHA2DS2-VASc score of 5 and unadjusted ischemic stroke rate of 7.2% per year. Unfortunately, the patient is not felt to be a long term anticoagulation candidate secondary to recurrent severe GI bleeds requiring transfusion. The patient's chart has been reviewed and I feel that they would be a candidate for short term oral anticoagulation after Watchman implant.   It is my belief that after undergoing a LAA closure procedure, DONNELLA MORFORD will not need long term anticoagulation which eliminates anticoagulation side effects and major bleeding risk.   Procedural risks for the Watchman implant have been reviewed with the patient including a 0.5% risk of stroke, <1% risk of perforation and <1% risk of device embolization.    The published clinical data on the safety and effectiveness of WATCHMAN include but are not limited to the following: - Holmes DR, Mechele Claude, Sick P et al. for the PROTECT AF Investigators. Percutaneous closure of the left atrial  appendage versus warfarin therapy for prevention of stroke in patients with atrial fibrillation: a randomised non-inferiority trial. Lancet 2009; 374: 534-42. Mechele Claude, Doshi SK, Abelardo Diesel D et al. on behalf of the PROTECT AF Investigators. Percutaneous Left Atrial Appendage Closure for Stroke Prophylaxis in Patients With Atrial Fibrillation 2.3-Year Follow-up of the PROTECT AF (Watchman Left Atrial Appendage System for Embolic Protection in Patients With Atrial Fibrillation) Trial. Circulation 2013; 127:720-729. - Alli O, Doshi S,  Kar S, Reddy VY, Sievert H et al. Quality of Life Assessment in the Randomized PROTECT AF (Percutaneous Closure of the Left Atrial Appendage Versus Warfarin Therapy for Prevention of Stroke in Patients With Atrial Fibrillation) Trial of Patients at Risk for Stroke With Nonvalvular Atrial Fibrillation. J Am Coll Cardiol 2013; 28:3151-7. Vertell Limber DR, Tarri Abernethy, Weinmann M, Clayton, Sievert H, Doshi S, Huber K, Reddy V. Prospective randomized evaluation of the Watchman left atrial appendage Device in patients with atrial fibrillation versus long-term warfarin therapy; the PREVAIL trial. Journal of the [employer] of Cardiology, Vol. 4, No. 1, 2014, 1-11. - Kar S, Doshi SK, Sadhu A, Horton R, Osorio J et al. Primary outcome evaluation of a next-generation left atrial appendage closure device: results from the PINNACLE FLX trial. Circulation 2021;143(18)1754-1762.    After today's visit with the patient which was dedicated solely for shared decision making visit regarding LAA closure device, the patient decided to proceed with the LAA appendage closure procedure scheduled to be done in the near future at [facility]. Prior to the procedure, I would like to obtain a TEE to assess the LAA anatomy.  Additionally, the patient will need TTE.   2.  CKD 4 Follow kidney function closely.  TEE rather than CT for preprocedural imaging.  Would not use contrast during  the procedure.  Before we finalize the scheduling of the watchman procedure I will need to review the transesophageal echocardiogram to make sure  her left atrial appendage anatomy is amenable to closure and that the mitral valve does not show evidence of mobile vegetation.  Total time spent with patient today 45 minutes. This includes reviewing records, evaluating the patient and coordinating care.  Medication Adjustments/Labs and Tests Ordered: Current medicines are reviewed at length with the patient today.  Concerns regarding medicines are outlined above.  Orders Placed This Encounter  Procedures  . EKG 12-Lead  . ECHOCARDIOGRAM COMPLETE   No orders of the defined types were placed in this encounter.    Signed, Lars Mage, MD, [facility]  03/30/2021 9:11 PM    Electrophysiology [facility]

## 2021-04-04 ENCOUNTER — Emergency Department (HOSPITAL_COMMUNITY)
Admission: EM | Admit: 2021-04-04 | Discharge: 2021-04-04 | Disposition: A | Discharge status: Home / Self Care | Payer: Medicare HMO | Attending: Emergency Medicine | Admitting: Emergency Medicine

## 2021-04-04 ENCOUNTER — Other Ambulatory Visit: Payer: Self-pay

## 2021-04-04 ENCOUNTER — Encounter (HOSPITAL_COMMUNITY)
Admission: RE | Admit: 2021-04-04 | Discharge: 2021-04-04 | Disposition: A | Discharge status: Home / Self Care | Payer: Medicare HMO | Source: Ambulatory Visit | Attending: Nephrology | Admitting: Nephrology

## 2021-04-04 ENCOUNTER — Encounter (HOSPITAL_COMMUNITY): Payer: Self-pay | Admitting: *Deleted

## 2021-04-04 ENCOUNTER — Telehealth: Payer: Self-pay | Admitting: Gastroenterology

## 2021-04-04 ENCOUNTER — Encounter (HOSPITAL_COMMUNITY): Payer: Self-pay

## 2021-04-04 DIAGNOSIS — I5030 Unspecified diastolic (congestive) heart failure: Secondary | ICD-10-CM | POA: Insufficient documentation

## 2021-04-04 DIAGNOSIS — Z7982 Long term (current) use of aspirin: Secondary | ICD-10-CM | POA: Diagnosis not present

## 2021-04-04 DIAGNOSIS — Z87891 Personal history of nicotine dependence: Secondary | ICD-10-CM | POA: Insufficient documentation

## 2021-04-04 DIAGNOSIS — Z7901 Long term (current) use of anticoagulants: Secondary | ICD-10-CM | POA: Insufficient documentation

## 2021-04-04 DIAGNOSIS — I130 Hypertensive heart and chronic kidney disease with heart failure and stage 1 through stage 4 chronic kidney disease, or unspecified chronic kidney disease: Secondary | ICD-10-CM | POA: Insufficient documentation

## 2021-04-04 DIAGNOSIS — D631 Anemia in chronic kidney disease: Secondary | ICD-10-CM | POA: Insufficient documentation

## 2021-04-04 DIAGNOSIS — R799 Abnormal finding of blood chemistry, unspecified: Secondary | ICD-10-CM | POA: Diagnosis present

## 2021-04-04 DIAGNOSIS — N189 Chronic kidney disease, unspecified: Secondary | ICD-10-CM | POA: Diagnosis not present

## 2021-04-04 DIAGNOSIS — K625 Hemorrhage of anus and rectum: Secondary | ICD-10-CM | POA: Diagnosis not present

## 2021-04-04 DIAGNOSIS — I480 Paroxysmal atrial fibrillation: Secondary | ICD-10-CM | POA: Insufficient documentation

## 2021-04-04 DIAGNOSIS — Z79899 Other long term (current) drug therapy: Secondary | ICD-10-CM | POA: Insufficient documentation

## 2021-04-04 DIAGNOSIS — N185 Chronic kidney disease, stage 5: Secondary | ICD-10-CM | POA: Insufficient documentation

## 2021-04-04 DIAGNOSIS — D649 Anemia, unspecified: Secondary | ICD-10-CM | POA: Diagnosis not present

## 2021-04-04 LAB — RENAL FUNCTION PANEL
Albumin: 3.9 g/dL (ref 3.5–5.0)
Anion gap: 9 (ref 5–15)
BUN: 34 mg/dL — ABNORMAL HIGH (ref 8–23)
CO2: 28 mmol/L (ref 22–32)
Calcium: 8.9 mg/dL (ref 8.9–10.3)
Chloride: 99 mmol/L (ref 98–111)
Creatinine, Ser: 2.46 mg/dL — ABNORMAL HIGH (ref 0.44–1.00)
GFR, Estimated: 20 mL/min — ABNORMAL LOW (ref >60)
Glucose, Bld: 100 mg/dL — ABNORMAL HIGH (ref 70–99)
Phosphorus: 4 mg/dL (ref 2.5–4.6)
Potassium: 4.2 mmol/L (ref 3.5–5.1)
Sodium: 136 mmol/L (ref 135–145)

## 2021-04-04 LAB — CBC WITH DIFFERENTIAL/PLATELET
Abs Immature Granulocytes: 0.01 10*3/uL (ref 0.00–0.07)
Basophils Absolute: 0 10*3/uL (ref 0.0–0.1)
Basophils Relative: 1 %
Eosinophils Absolute: 0.5 10*3/uL (ref 0.0–0.5)
Eosinophils Relative: 8 %
HCT: 24.9 % — ABNORMAL LOW (ref 36.0–46.0)
Hemoglobin: 7.4 g/dL — ABNORMAL LOW (ref 12.0–15.0)
Immature Granulocytes: 0 %
Lymphocytes Relative: 33 %
Lymphs Abs: 2.4 10*3/uL (ref 0.7–4.0)
MCH: 30.3 pg (ref 26.0–34.0)
MCHC: 29.7 g/dL — ABNORMAL LOW (ref 30.0–36.0)
MCV: 102 fL — ABNORMAL HIGH (ref 80.0–100.0)
Monocytes Absolute: 0.6 10*3/uL (ref 0.1–1.0)
Monocytes Relative: 8 %
Neutro Abs: 3.6 10*3/uL (ref 1.7–7.7)
Neutrophils Relative %: 50 %
Platelets: 279 10*3/uL (ref 150–400)
RBC: 2.44 MIL/uL — ABNORMAL LOW (ref 3.87–5.11)
RDW: 15 % (ref 11.5–15.5)
WBC: 7.2 10*3/uL (ref 4.0–10.5)
nRBC: 0 % (ref 0.0–0.2)

## 2021-04-04 LAB — COMPREHENSIVE METABOLIC PANEL
ALT: 9 U/L (ref 0–44)
AST: 18 U/L (ref 15–41)
Albumin: 4 g/dL (ref 3.5–5.0)
Alkaline Phosphatase: 63 U/L (ref 38–126)
Anion gap: 9 (ref 5–15)
BUN: 33 mg/dL — ABNORMAL HIGH (ref 8–23)
CO2: 29 mmol/L (ref 22–32)
Calcium: 9.3 mg/dL (ref 8.9–10.3)
Chloride: 102 mmol/L (ref 98–111)
Creatinine, Ser: 2.34 mg/dL — ABNORMAL HIGH (ref 0.44–1.00)
GFR, Estimated: 21 mL/min — ABNORMAL LOW (ref >60)
Glucose, Bld: 102 mg/dL — ABNORMAL HIGH (ref 70–99)
Potassium: 4.8 mmol/L (ref 3.5–5.1)
Sodium: 140 mmol/L (ref 135–145)
Total Bilirubin: 0.6 mg/dL (ref 0.3–1.2)
Total Protein: 6.8 g/dL (ref 6.5–8.1)

## 2021-04-04 LAB — HEMOGLOBIN AND HEMATOCRIT, BLOOD
HCT: 29.3 % — ABNORMAL LOW (ref 36.0–46.0)
Hemoglobin: 9 g/dL — ABNORMAL LOW (ref 12.0–15.0)

## 2021-04-04 LAB — IRON AND TIBC
Iron: 40 ug/dL (ref 28–170)
Saturation Ratios: 11 % (ref 10.4–31.8)
TIBC: 351 ug/dL (ref 250–450)
UIBC: 311 ug/dL

## 2021-04-04 LAB — CBC
HCT: 23.4 % — ABNORMAL LOW (ref 36.0–46.0)
Hemoglobin: 7 g/dL — ABNORMAL LOW (ref 12.0–15.0)
MCH: 30.6 pg (ref 26.0–34.0)
MCHC: 29.9 g/dL — ABNORMAL LOW (ref 30.0–36.0)
MCV: 102.2 fL — ABNORMAL HIGH (ref 80.0–100.0)
Platelets: 268 10*3/uL (ref 150–400)
RBC: 2.29 MIL/uL — ABNORMAL LOW (ref 3.87–5.11)
RDW: 15.2 % (ref 11.5–15.5)
WBC: 6.1 10*3/uL (ref 4.0–10.5)
nRBC: 0 % (ref 0.0–0.2)

## 2021-04-04 LAB — POC OCCULT BLOOD, ED: Fecal Occult Bld: POSITIVE — AB

## 2021-04-04 LAB — PROTEIN / CREATININE RATIO, URINE
Creatinine, Urine: 46.57 mg/dL
Protein Creatinine Ratio: 0.24 mg/mg{Cre} — ABNORMAL HIGH (ref 0.00–0.15)
Total Protein, Urine: 11 mg/dL

## 2021-04-04 LAB — PREPARE RBC (CROSSMATCH)

## 2021-04-04 MED ORDER — POTASSIUM CHLORIDE CRYS ER 20 MEQ PO TBCR: 20.0000 meq | EXTENDED_RELEASE_TABLET | Freq: Once | ORAL | Status: DC

## 2021-04-04 MED ORDER — EPOETIN ALFA-EPBX 3000 UNIT/ML IJ SOLN: 3000.0000 [IU] | Freq: Once | INTRAMUSCULAR | Status: DC

## 2021-04-04 MED ORDER — FUROSEMIDE 40 MG PO TABS
40.0000 mg | ORAL_TABLET | Freq: Every day | ORAL | Status: DC
  Administered 2021-04-04: 40 mg via ORAL
  Filled 2021-04-04: qty 1

## 2021-04-04 MED ORDER — PANTOPRAZOLE SODIUM 40 MG PO TBEC
40.0000 mg | DELAYED_RELEASE_TABLET | Freq: Two times a day (BID) | ORAL | Status: DC
  Filled 2021-04-04: qty 1

## 2021-04-04 MED ORDER — EPOETIN ALFA-EPBX 3000 UNIT/ML IJ SOLN
INTRAMUSCULAR | Status: AC
  Administered 2021-04-04: 3000 [IU]
  Filled 2021-04-04: qty 1

## 2021-04-04 MED ORDER — SODIUM CHLORIDE 0.9 % IV SOLN
10.0000 mL/h | Freq: Once | INTRAVENOUS | Status: AC
  Administered 2021-04-04: 10 mL/h via INTRAVENOUS

## 2021-04-04 MED ORDER — FLECAINIDE ACETATE 50 MG PO TABS
50.0000 mg | ORAL_TABLET | Freq: Two times a day (BID) | ORAL | Status: DC
  Administered 2021-04-04: 50 mg via ORAL
  Filled 2021-04-04: qty 1

## 2021-04-04 MED ORDER — LOSARTAN POTASSIUM 25 MG PO TABS
25.0000 mg | ORAL_TABLET | Freq: Every day | ORAL | Status: DC
  Administered 2021-04-04: 25 mg via ORAL
  Filled 2021-04-04: qty 1

## 2021-04-04 MED ORDER — PANTOPRAZOLE SODIUM 40 MG IV SOLR
40.0000 mg | Freq: Once | INTRAVENOUS | Status: AC
  Administered 2021-04-04: 40 mg via INTRAVENOUS
  Filled 2021-04-04: qty 40

## 2021-04-04 MED ORDER — SODIUM CHLORIDE 0.9 % IV BOLUS
500.0000 mL | Freq: Once | INTRAVENOUS | Status: AC
  Administered 2021-04-04: 500 mL via INTRAVENOUS

## 2021-04-04 NOTE — ED Provider Notes (Signed)
[facility] Provider Note   CSN: [id number] Arrival date & time: 04/04/21  0941     History Chief Complaint  Patient presents with  . Abnormal Lab    Hgb 7.0    [name] [name] is a 75 y.o. female.  HPI      [name] [name] is a 75 y.o. female with past medical history of anemia, chronic kidney disease, hypertension, paroxysmal atrial fibrillation anticoagulated on Eliquis who presents to the Emergency Department from the short stay area.  Patient sent here for evaluation of anemia.  States that her hemoglobin was 7 earlier today at the clinic and she was advised to come to the emergency department for possible transfusion.  Patient denies any symptoms at present.  She endorses bright red blood per rectum 1 month ago, but none since.  States that she has a long standing history of anemia since starting Eliquis.  She had upper endoscopy in December 2021 without findings of bleeding within the upper GI tract.  She had small bowel endoscopy also in December with findings of angiodysplastic lesions without bleeding of the proximal jejunum and duodenum.  Currently, she denies any dizziness, headache, generalized weakness, abdominal pain, nausea or vomiting.  No black or bloody stools at present.    Past Medical History:  Diagnosis Date  . Anemia   . Chronic kidney disease   . Constipation 07/17/2017  . GERD (gastroesophageal reflux disease)   . Gout   . Grade II diastolic dysfunction 29/08/3715  . Hyperlipidemia   . Hypertension   . Hypokalemia   . OSA (obstructive sleep apnea) 08/09/2020  . PAF (paroxysmal atrial fibrillation) ([place])   . PAF (paroxysmal atrial fibrillation) ([place])   . PUD (peptic ulcer disease)   . Syncope    Neurocardiogenic    Patient Active Problem List   Diagnosis Date Noted  . AVM (arteriovenous malformation) of small bowel, 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/2021  . Upper GI bleed   . Dry gangrene ([place])   . GI bleed 12/05/2020  . Acute  blood loss anemia 11/24/2020  . Heme positive stool   . Iron deficiency anemia due to chronic blood loss   . Symptomatic anemia 11/23/2020  . Grade II diastolic dysfunction 96/78/9381  . Hyponatremia 11/23/2020  . Symptomatic bradycardia 10/20/2020  . AKI (acute kidney injury) ([place]) 10/20/2020  . Microcytic anemia 10/20/2020  . OSA (obstructive sleep apnea) 08/09/2020  . Paroxysmal atrial fibrillation ([place]) 06/15/2020  . Bradycardia with 31-40 beats per minute 06/15/2020  . Snoring 06/15/2020  . Intermittent palpitations 06/15/2020  . Near syncope 06/15/2020  . Hypertension, uncontrolled 06/15/2020  . Atrial fibrillation with RVR ([place]) 05/04/2020  . Hyperlipidemia   . Gout   . Encounter for screening colonoscopy   . Hemorrhoid 12/16/2018  . GERD (gastroesophageal reflux disease) 01/29/2018  . Constipation 07/17/2017  . Rectal bleeding 01/30/2017  . Chest pain 12/09/2013  . Hypokalemia 05/01/2012  . Renal insufficiency 05/01/2012  . Essential hypertension, benign 05/03/2010  . VASOVAGAL SYNCOPE 05/03/2010    Past Surgical History:  Procedure Laterality Date  . ABDOMINAL AORTOGRAM W/LOWER EXTREMITY Right 12/13/2020   Procedure: ABDOMINAL AORTOGRAM W/LOWER EXTREMITY;  Surgeon: Serafina Mitchell, MD;  Location: [facility];  Service: Cardiovascular;  Laterality: Right;  . ABDOMINAL HYSTERECTOMY    . AMPUTATION Right 12/14/2020   Procedure: Right fifth toe amputation;  Surgeon: Rosetta Posner, MD;  Location: [facility];  Service: Vascular;  Laterality: Right;  . CATARACT EXTRACTION  Bilateral   . COLONOSCOPY N/A 10/07/2014   Dr. Oneida Alar: redundant left colon, moderate sized external hemorrhoids   . COLONOSCOPY N/A 03/11/2020   Surgeon: Danie Binder, MD;  9 polyps, majority of which were tubular adenomas, nodular mucosa in the anus s/p biopsy which was benign.  . ENTEROSCOPY N/A 11/27/2020   Surgeon: Montez Morita, Quillian Quince, MD; multiple nonbleeding AVMs in the duodenum s/p APC  therapy, few nonbleeding AVMs in jejunum s/p argon beam coagulation.  . ESOPHAGOGASTRODUODENOSCOPY (EGD) WITH PROPOFOL N/A 11/25/2020    Surgeon: Daneil Dolin, MD; Normal, s/p capsule deployment  . 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   Marland Kitchen HOT HEMOSTASIS  11/27/2020   Procedure: HOT HEMOSTASIS (ARGON PLASMA COAGULATION/BICAP);  Surgeon: Montez Morita, Quillian Quince, MD;  [facility];  Service: Gastroenterology;;  . LIPOMA RESECTION    . PERIPHERAL VASCULAR INTERVENTION Right 12/13/2020   Procedure: PERIPHERAL VASCULAR INTERVENTION;  Surgeon: Serafina Mitchell, MD;  Location: [place] [facility];  Service: Cardiovascular;  Laterality: Right;  SFA/PT/AT  . POLYPECTOMY  03/11/2020   Procedure: POLYPECTOMY;  Surgeon: Danie Binder, MD;  [facility];  Service: Endoscopy;;  cecal, transverse, descending, sigmoid     OB History   No obstetric history on file.     Family History  Problem Relation Age of Onset  . Stroke Mother   . Dementia Father   . Cancer - Other Sister   . Atrial fibrillation Sister   . Heart failure Sister   . Colon cancer Maternal Grandmother        diagnosed in her 58s    Social History   Tobacco Use  . Smoking status: Former Smoker    Packs/day: 1.00    Years: 20.00    Pack years: 20.00    Types: Cigarettes    Start date: 03/16/1964    Quit date: 10/07/1997    Years since quitting: 23.5  . Smokeless tobacco: Never Used  Vaping Use  . Vaping Use: Never used  Substance Use Topics  . Alcohol use: No    Alcohol/week: 0.0 standard drinks  . Drug use: No    Home Medications Prior to Admission medications   Medication Sig Start Date End Date Taking? Authorizing Provider  acetaminophen (TYLENOL) 500 MG tablet Take 1,000 mg by mouth every 6 (six) hours as needed for moderate pain.   Yes [provider]  albuterol (VENTOLIN HFA) 108 (90 Base) MCG/ACT inhaler Inhale 1-2 puffs into the lungs every 6 (six) hours as needed for wheezing or shortness of breath.  09/23/20  Yes [provider]  apixaban (ELIQUIS) 5 MG TABS tablet Take 1 tablet (5 mg total) by mouth 2 (two) times daily. 07/25/20 07/20/21 Yes Satira Sark, MD  cetirizine (ZYRTEC) 10 MG tablet Take 10 mg by mouth daily as needed for allergies.   Yes [provider]  Cholecalciferol (VITAMIN D-3) 25 MCG (1000 UT) CAPS Take 1 capsule by mouth daily.   Yes [provider]  colchicine 0.6 MG tablet Take 1 tablet by mouth as needed (flare up). 07/31/20  Yes [provider]  cyclobenzaprine (FLEXERIL) 5 MG tablet Take 5 mg by mouth 3 (three) times daily as needed for muscle spasms. 06/19/20  Yes [provider]  diclofenac Sodium (VOLTAREN) 1 % GEL Apply 1 application topically 4 (four) times daily  as needed (pain).   Yes [provider]  ELDERBERRY PO Take 2 capsules by mouth daily.    Yes [provider]  epoetin alfa-epbx (RETACRIT) 3000 UNIT/ML injection Inject 3,000 Units into the skin every 14 (fourteen) days.   Yes Bhutani, Manpreet S, MD  ferrous sulfate 325 (65 FE) MG tablet Take 325 mg by mouth daily with breakfast.   Yes [provider]  flecainide (TAMBOCOR) 50 MG tablet TAKE 1 TABLET BY MOUTH TWICE A DAY Patient taking differently: Take 50 mg by mouth 2 (two) times daily. 03/03/21  Yes Satira Sark, MD  fluticasone [facility]) 50 MCG/ACT nasal spray Place 1 spray into both nostrils daily as needed for allergies or rhinitis.   Yes [provider]  Fluticasone-Salmeterol (ADVAIR) 500-50 MCG/DOSE AEPB Inhale 1 puff into the lungs every 12 (twelve) hours.   Yes [provider]  furosemide (LASIX) 40 MG tablet TAKE 1 TABLET BY MOUTH EVERY DAY Patient taking differently: Take 40 mg by mouth daily. 03/27/21  Yes Evans Lance, MD  gabapentin  (NEURONTIN) 300 MG capsule Take 300 mg by mouth 3 (three) times daily as needed (nerve pain). 11/21/20  Yes [provider]  hydrocortisone (ANUSOL-HC) 2.5 % rectal cream Place 1 application rectally 2 (two) times daily. Patient taking differently: Place 1 application rectally 2 (two) times daily as needed for hemorrhoids or anal itching. 03/10/21  Yes Aliene Altes S, PA-C  losartan (COZAAR) 25 MG tablet Take 1 tablet by mouth daily. 01/05/21  Yes [provider]  lubiprostone (AMITIZA) 24 MCG capsule Take 1 capsule (24 mcg total) by mouth in the morning and at bedtime. 02/23/21 02/23/22 Yes Harper, Tivis Ringer, PA-C  octreotide (SANDOSTATIN) 100 MCG/ML SOLN injection INJECT 1 ML UNDER THE SKIN EVERY 12 HOURS. Patient taking differently: Inject 100 mcg into the skin every 12 (twelve) hours. 03/11/21  Yes Mahala Menghini, PA-C  pantoprazole (PROTONIX) 40 MG tablet TAKE 1 TABLET (40 MG TOTAL) BY MOUTH 2 (TWO) TIMES DAILY BEFORE A MEAL. 03/13/21  Yes Fayrene Helper, MD  potassium chloride SA (KLOR-CON) 20 MEQ tablet Take 20 mEq by mouth in the morning, at noon, and at bedtime.   Yes [provider]  rosuvastatin (CRESTOR) 10 MG tablet TAKE 1 TABLET BY MOUTH DAILY Patient taking differently: Take 10 mg by mouth daily. 03/16/21  Yes Fayrene Helper, MD  vitamin C (ASCORBIC ACID) 250 MG tablet Take 500 mg by mouth daily.   Yes [provider]  aspirin EC 81 MG EC tablet Take 1 tablet (81 mg total) by mouth daily. Swallow whole. Patient not taking: Reported on 04/04/2021 12/16/20   Mckinley Jewel, MD  feeding supplement (ENSURE ENLIVE / ENSURE PLUS) LIQD Take 237 mLs by mouth 2 (two) times daily between meals. Patient not taking: Reported on 04/04/2021 12/15/20   Mckinley Jewel, MD  Nutritional Supplements (,FEEDING SUPPLEMENT, PROSOURCE PLUS) liquid Take 30 mLs by mouth 2 (two) times daily between meals. 12/15/20   Pahwani, Michell Heinrich, MD    Allergies     Acetaminophen-codeine, Amlodipine, Amoxicillin, Doxycycline, Acetaminophen-codeine, Allopurinol, and Tramadol  Review of Systems   Review of Systems  Constitutional: Negative for chills, fatigue and fever.  HENT: Negative for trouble swallowing.   Respiratory: Negative for cough and shortness of breath.   Cardiovascular: Negative for chest pain and palpitations.  Gastrointestinal: Negative for abdominal pain, blood in stool, diarrhea, nausea and vomiting.  Genitourinary: Negative for dysuria, flank pain and  hematuria.  Musculoskeletal: Negative for arthralgias, back pain and myalgias.  Skin: Negative for rash.  Neurological: Negative for dizziness, syncope, weakness, numbness and headaches.  Hematological: Does not bruise/bleed easily.    Physical Exam Updated Vital Signs BP 138/62   Pulse 65   Temp 98.2 F (36.8 C) (Oral)   Ht 5\' 2"  (1.575 m)   Wt 68.9 kg   SpO2 98%   BMI 27.80 kg/m   Physical Exam Vitals and nursing note reviewed. Exam conducted with a chaperone present.  Constitutional:      Appearance: Normal appearance. She is not ill-appearing or toxic-appearing.  HENT:     Head: Normocephalic.     Mouth/Throat:     Mouth: Mucous membranes are moist.  Eyes:     Pupils: Pupils are equal, round, and reactive to light.  Neck:     Thyroid: No thyromegaly.     Meningeal: Kernig's sign absent.  Cardiovascular:     Rate and Rhythm: Normal rate and regular rhythm.     Pulses: Normal pulses.  Pulmonary:     Effort: Pulmonary effort is normal.     Breath sounds: Normal breath sounds.  Abdominal:     Palpations: Abdomen is soft.     Tenderness: There is no abdominal tenderness. There is no guarding or rebound.  Genitourinary:    Rectum: Guaiac result positive. No mass, tenderness or anal fissure.     Comments: Patient with dark brown heme positive stool.  No palpable rectal masses. Musculoskeletal:        General: Normal range of motion.     Cervical back: Normal  range of motion.     Right lower leg: No edema.     Left lower leg: No edema.  Skin:    General: Skin is warm.     Findings: No rash.  Neurological:     General: No focal deficit present.     Mental Status: She is alert.     Sensory: No sensory deficit.     Motor: No weakness.  Psychiatric:        Mood and Affect: Mood normal.     ED Results / Procedures / Treatments   Labs (all labs ordered are listed, but only abnormal results are displayed) Labs Reviewed  CBC WITH DIFFERENTIAL/PLATELET - Abnormal; Notable for the following components:      Result Value   RBC 2.44 (*)    Hemoglobin 7.4 (*)    HCT 24.9 (*)    MCV 102.0 (*)    MCHC 29.7 (*)    All other components within normal limits  COMPREHENSIVE METABOLIC PANEL - Abnormal; Notable for the following components:   Glucose, Bld 102 (*)    BUN 33 (*)    Creatinine, Ser 2.34 (*)    GFR, Estimated 21 (*)    All other components within normal limits  POC OCCULT BLOOD, ED - Abnormal; Notable for the following components:   Fecal Occult Bld POSITIVE (*)    All other components within normal limits  TYPE AND SCREEN  PREPARE RBC (CROSSMATCH)    EKG None  Radiology No results found.  Procedures Procedures   Medications Ordered in ED Medications  flecainide (TAMBOCOR) tablet 50 mg (50 mg Oral Given 04/04/21 1507)  furosemide (LASIX) tablet 40 mg (40 mg Oral Given 04/04/21 1503)  losartan (COZAAR) tablet 25 mg (25 mg Oral Given 04/04/21 1504)  pantoprazole (PROTONIX) EC tablet 40 mg (has no administration in time range)  sodium  chloride 0.9 % bolus 500 mL (0 mLs Intravenous Stopped 04/04/21 1229)  pantoprazole (PROTONIX) injection 40 mg (40 mg Intravenous Given 04/04/21 1139)  0.9 %  sodium chloride infusion (10 mL/hr Intravenous New Bag/Given 04/04/21 1553)    ED Course  I have reviewed the triage vital signs and the nursing notes.  Pertinent labs & imaging results that were available during my care of the patient  were reviewed by me and considered in my medical decision making (see chart for details).  CRITICAL CARE Performed by: Tram Wrenn Total critical care time: 35  minutes Critical care time was exclusive of separately billable procedures and treating other patients. Critical care was necessary to treat or prevent imminent or life-threatening deterioration. Critical care was time spent personally by me on the following activities: development of treatment plan with patient and/or surrogate as well as nursing, discussions with consultants, evaluation of patient's response to treatment, examination of patient, obtaining history from patient or surrogate, ordering and performing treatments and interventions, ordering and review of laboratory studies, ordering and review of radiographic studies, pulse oximetry and re-evaluation of patient's condition.     MDM Rules/Calculators/A&P                          Patient here, sent from a short stay area for evaluation of low hemoglobin.  Reported hemoglobin level of 7 this morning at clinic.  Patient denies any symptoms at present.  Vital signs reassuring.  Will obtain repeat labs.  Orthostatic Lying  BP- Lying: 138/61 Pulse- Lying: 70 Orthostatic Sitting BP- Sitting: 153/61 Pulse- Sitting: 73 Orthostatic Standing at 0 minutes BP- Standing at 0 minutes: 133/58 Pulse- Standing at 0 minutes: 78   Labs interpreted by me, hemoglobin 7.4 here, no leukocytosis.  Kidney fuunctions appear baseline.  On my exam, pt does have dark brown heme positive stool.  Hgb here 7.4.  No orthostatic hypotension.   Discussed plan with Dr. Roderic Palau and we will transfuse 1 unit of blood and I will consult with GI  Discussed findings with Dr. Jenetta Downer, who agrees with plan of transfusion and recommends pt f/u closely with her gastroenterologist at [facility].  He also agrees to send note to her local GI, Dr. Abbey Chatters.    1650  Pt has ate a snack. Sleeping, easily aroused.  Vitals reassuring.     1915  Transfusion complete.  Repeat H&H ordered for 20:15.  She will likely be discharged and she is agreeable to close f/u with her GI specialist at [facility].    1920  Discussed plan with Evalee Jefferson, PA-C who agrees to review H&H when resulted    Final Clinical Impression(s) / ED Diagnoses Final diagnoses:  Symptomatic anemia  Rectal bleeding    Rx / DC Orders ED Discharge Orders    None       Bufford Lope 04/04/21 1929    Milton Ferguson, MD 04/05/21 1735

## 2021-04-04 NOTE — Telephone Encounter (Signed)
Patient is being seen in the ED today for anemia with hemoglobin 7.4. ED provider spoke with Dr. 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 She recently saw [facility] on 3/23. They advised she continue SQ octreotide and consider DBE if recurrent bleeding in spite of octreotide.    I briefly went by to see the patient. She denies any overt GI bleeding and is receiving PRBCs. Advised she call [facility] tomorrow to update them and request follow-up as she is going to need double balloon enteroscopy.   I also updated Dr. Abbey Chatters (patient's primary gastroenterologist) who recommended updating CBC in a couple of weeks. We will go ahead and arrange for 1 week.    Dena: Please arrange CBC in 1 week. Dx: Anemia.

## 2021-04-04 NOTE — Telephone Encounter (Signed)
error 

## 2021-04-04 NOTE — ED Notes (Signed)
Pt tolerated orthostatic vitals signs well. Pt denies feeling dizzy or lightheaded. States she is only sleepy.

## 2021-04-04 NOTE — ED Provider Notes (Signed)
Patient signed out to me by Kem Parkinson, PA-C pending blood transfusion secondary to anemia associated with chronic GI blood loss.  Repeat hemoglobin is 9.0, up from 7.4 upon initial presentation.  Patient is stable for discharge home.  She will follow-up with her GI specialist at [facility], also local GI Dr. Abbey Chatters also to follow-up with this patient.   Evalee Jefferson, PA-C 04/04/21 2101    Wyvonnia Dusky, MD 04/05/21 [phone]

## 2021-04-04 NOTE — Discharge Instructions (Addendum)
You have received a blood transfusion this evening.  As discussed, please contact your GI specialist at [facility] tomorrow to arrange follow-up appointment.  Someone from Dr. Ave Filter office will also follow-up with you.  Please return to the emergency department if you develop any new or worsening symptoms.

## 2021-04-04 NOTE — ED Triage Notes (Signed)
Pt was sent over from [facility] for low hemoglobin level of 7.0. Pt was going into the clinic today for an anemia shot but when her hemoglobin level was found to be low she was sent over to ED for further evaluation. Pt c/o feeling tired.

## 2021-04-05 ENCOUNTER — Other Ambulatory Visit: Payer: Self-pay

## 2021-04-05 DIAGNOSIS — K625 Hemorrhage of anus and rectum: Secondary | ICD-10-CM

## 2021-04-05 DIAGNOSIS — D649 Anemia, unspecified: Secondary | ICD-10-CM

## 2021-04-05 LAB — BPAM RBC
Blood Product Expiration Date: 202205162359
ISSUE DATE / TIME: 202204121544
Unit Type and Rh: 5100

## 2021-04-05 LAB — TYPE AND SCREEN
ABO/RH(D): O POS
Antibody Screen: NEGATIVE
Unit division: 0

## 2021-04-05 LAB — PARATHYROID HORMONE, INTACT (NO CA): PTH: 57 pg/mL (ref 15–65)

## 2021-04-05 NOTE — Telephone Encounter (Signed)
Noted and put in the mail today

## 2021-04-06 LAB — POCT HEMOGLOBIN-HEMACUE: Hemoglobin: 6.5 g/dL — CL (ref 12.0–15.0)

## 2021-04-10 LAB — POCT HEMOGLOBIN-HEMACUE: Hemoglobin: 8.6 g/dL — ABNORMAL LOW (ref 12.0–15.0)

## 2021-04-11 ENCOUNTER — Encounter (HOSPITAL_COMMUNITY)
Admission: RE | Admit: 2021-04-11 | Discharge: 2021-04-11 | Disposition: A | Discharge status: Home / Self Care | Payer: Medicare HMO | Source: Ambulatory Visit | Attending: Nephrology | Admitting: Nephrology

## 2021-04-15 ENCOUNTER — Other Ambulatory Visit: Payer: Self-pay | Admitting: Family Medicine

## 2021-04-18 ENCOUNTER — Encounter (HOSPITAL_COMMUNITY)
Admission: RE | Admit: 2021-04-18 | Discharge: 2021-04-18 | Disposition: A | Discharge status: Home / Self Care | Payer: Medicare HMO | Source: Ambulatory Visit | Attending: Nephrology | Admitting: Nephrology

## 2021-04-18 ENCOUNTER — Other Ambulatory Visit: Payer: Self-pay

## 2021-04-18 DIAGNOSIS — N185 Chronic kidney disease, stage 5: Secondary | ICD-10-CM | POA: Diagnosis present

## 2021-04-18 DIAGNOSIS — K625 Hemorrhage of anus and rectum: Secondary | ICD-10-CM | POA: Diagnosis not present

## 2021-04-18 DIAGNOSIS — D631 Anemia in chronic kidney disease: Secondary | ICD-10-CM | POA: Diagnosis not present

## 2021-04-18 DIAGNOSIS — D649 Anemia, unspecified: Secondary | ICD-10-CM | POA: Diagnosis not present

## 2021-04-18 LAB — DIFFERENTIAL
Abs Immature Granulocytes: 0.01 10*3/uL (ref 0.00–0.07)
Basophils Absolute: 0 10*3/uL (ref 0.0–0.1)
Basophils Relative: 1 %
Eosinophils Absolute: 0.5 10*3/uL (ref 0.0–0.5)
Eosinophils Relative: 8 %
Immature Granulocytes: 0 %
Lymphocytes Relative: 38 %
Lymphs Abs: 2.4 10*3/uL (ref 0.7–4.0)
Monocytes Absolute: 0.5 10*3/uL (ref 0.1–1.0)
Monocytes Relative: 9 %
Neutro Abs: 2.7 10*3/uL (ref 1.7–7.7)
Neutrophils Relative %: 44 %

## 2021-04-18 LAB — CBC
HCT: 29.2 % — ABNORMAL LOW (ref 36.0–46.0)
Hemoglobin: 8.8 g/dL — ABNORMAL LOW (ref 12.0–15.0)
MCH: 30.4 pg (ref 26.0–34.0)
MCHC: 30.1 g/dL (ref 30.0–36.0)
MCV: 101 fL — ABNORMAL HIGH (ref 80.0–100.0)
Platelets: 316 10*3/uL (ref 150–400)
RBC: 2.89 MIL/uL — ABNORMAL LOW (ref 3.87–5.11)
RDW: 13.9 % (ref 11.5–15.5)
WBC: 6.1 10*3/uL (ref 4.0–10.5)
nRBC: 0 % (ref 0.0–0.2)

## 2021-04-18 LAB — POCT HEMOGLOBIN-HEMACUE: Hemoglobin: 9.1 g/dL — ABNORMAL LOW (ref 12.0–15.0)

## 2021-04-18 MED ORDER — EPOETIN ALFA-EPBX 3000 UNIT/ML IJ SOLN
INTRAMUSCULAR | Status: AC
  Administered 2021-04-18: 3000 [IU] via SUBCUTANEOUS
  Filled 2021-04-18: qty 1

## 2021-04-18 MED ORDER — EPOETIN ALFA-EPBX 3000 UNIT/ML IJ SOLN: 3000.0000 [IU] | Freq: Once | INTRAMUSCULAR | Status: AC

## 2021-04-20 ENCOUNTER — Other Ambulatory Visit: Payer: Self-pay

## 2021-04-20 DIAGNOSIS — K625 Hemorrhage of anus and rectum: Secondary | ICD-10-CM

## 2021-04-20 DIAGNOSIS — D649 Anemia, unspecified: Secondary | ICD-10-CM

## 2021-04-24 ENCOUNTER — Ambulatory Visit: Payer: Medicare HMO | Admitting: Vascular Surgery

## 2021-04-24 ENCOUNTER — Encounter: Payer: Self-pay | Admitting: Vascular Surgery

## 2021-04-24 ENCOUNTER — Ambulatory Visit (INDEPENDENT_AMBULATORY_CARE_PROVIDER_SITE_OTHER): Payer: Medicare HMO

## 2021-04-24 ENCOUNTER — Other Ambulatory Visit: Payer: Self-pay

## 2021-04-24 VITALS — BP 155/77 | HR 69 | Temp 97.4°F | Resp 14 | Ht 62.0 in | Wt 159.0 lb

## 2021-04-24 DIAGNOSIS — I739 Peripheral vascular disease, unspecified: Secondary | ICD-10-CM

## 2021-04-24 DIAGNOSIS — Z89421 Acquired absence of other right toe(s): Secondary | ICD-10-CM | POA: Diagnosis not present

## 2021-04-24 NOTE — H&P (View-Only) (Signed)
[facility] [place]  Patient name: [name] [name] MRN: [id number] DOB: May 05, 1946 Sex: female  REASON FOR VISIT: Follow-up right SFA angioplasty in December 2021 and subsequent right second toe amputation.  HPI: [name] [name] is a 75 y.o. female here for follow-up.  Continues to do well.  She has had no new difficulty and no tissue loss.  She does have some soreness on her right posterior heel.  She reports that her renal function is stabilized and there is no longer any plan for access placement  Past Medical History:  Diagnosis Date  . Anemia   . Chronic kidney disease   . Constipation 07/17/2017  . GERD (gastroesophageal reflux disease)   . Gout   . Grade II diastolic dysfunction 77/03/1286  . Hyperlipidemia   . Hypertension   . Hypokalemia   . OSA (obstructive sleep apnea) 08/09/2020  . PAF (paroxysmal atrial fibrillation) ([place])   . PAF (paroxysmal atrial fibrillation) ([place])   . PUD (peptic ulcer disease)   . Syncope    Neurocardiogenic    Family History  Problem Relation Age of Onset  . Stroke Mother   . Dementia Father   . Cancer - Other Sister   . Atrial fibrillation Sister   . Heart failure Sister   . Colon cancer Maternal Grandmother        diagnosed in her 71s    SOCIAL HISTORY: Social History   Tobacco Use  . Smoking status: Former Smoker    Packs/day: 1.00    Years: 20.00    Pack years: 20.00    Types: Cigarettes    Start date: 03/16/1964    Quit date: 10/07/1997    Years since quitting: 23.5  . Smokeless tobacco: Never Used  Substance Use Topics  . Alcohol use: No    Alcohol/week: 0.0 standard drinks    Allergies  Allergen Reactions  . Acetaminophen-Codeine Hives  . Amlodipine     Felt she retained fluid in her chest   . Amoxicillin Hives    Did it involve swelling of the face/tongue/throat, SOB, or low BP? No Did it involve sudden or severe rash/hives, skin peeling, or any  reaction on the inside of your mouth or nose? No Did you need to seek medical attention at a hospital or doctor's office? No When did it last happen?10 + years If all above answers are "NO", may proceed with cephalosporin use.   Marland Kitchen Doxycycline     " sick and weak"  . Acetaminophen-Codeine Rash  . Allopurinol Hives and Rash  . Tramadol Hives and Rash    Current Outpatient Medications  Medication Sig Dispense Refill  . acetaminophen (TYLENOL) 500 MG tablet Take 1,000 mg by mouth every 6 (six) hours as needed for moderate pain.    Marland Kitchen albuterol (VENTOLIN HFA) 108 (90 Base) MCG/ACT inhaler Inhale 1-2 puffs into the lungs every 6 (six) hours as needed for wheezing or shortness of breath.     Marland Kitchen apixaban (ELIQUIS) 5 MG TABS tablet Take 1 tablet (5 mg total) by mouth 2 (two) times daily. 180 tablet 3  . cetirizine (ZYRTEC) 10 MG tablet Take 10 mg by mouth daily as needed for allergies.    . Cholecalciferol (VITAMIN D-3) 25 MCG (1000 UT) CAPS Take 1 capsule by mouth daily.    . colchicine 0.6 MG tablet Take 1 tablet by mouth as needed (flare up).    . cyclobenzaprine (FLEXERIL) 5 MG tablet Take 5 mg by  mouth 3 (three) times daily as needed for muscle spasms.    . diclofenac Sodium (VOLTAREN) 1 % GEL Apply 1 application topically 4 (four) times daily as needed (pain).    Marland Kitchen ELDERBERRY PO Take 2 capsules by mouth daily.     Marland Kitchen epoetin alfa-epbx (RETACRIT) 3000 UNIT/ML injection Inject 3,000 Units into the skin every 14 (fourteen) days.    . feeding supplement (ENSURE ENLIVE / ENSURE PLUS) LIQD Take 237 mLs by mouth 2 (two) times daily between meals. 237 mL 12  . ferrous sulfate 325 (65 FE) MG tablet Take 325 mg by mouth daily with breakfast.    . flecainide (TAMBOCOR) 50 MG tablet TAKE 1 TABLET BY MOUTH TWICE A DAY (Patient taking differently: Take 50 mg by mouth 2 (two) times daily.) 180 tablet 3  . fluticasone (FLONASE) 50 MCG/ACT nasal spray Place 1 spray into both nostrils daily as needed for  allergies or rhinitis.    . Fluticasone-Salmeterol (ADVAIR) 500-50 MCG/DOSE AEPB Inhale 1 puff into the lungs every 12 (twelve) hours.    . furosemide (LASIX) 40 MG tablet TAKE 1 TABLET BY MOUTH EVERY DAY (Patient taking differently: Take 40 mg by mouth daily.) 90 tablet 1  . gabapentin (NEURONTIN) 300 MG capsule Take 300 mg by mouth 3 (three) times daily as needed (nerve pain).    . hydrocortisone (ANUSOL-HC) 2.5 % rectal cream Place 1 application rectally 2 (two) times daily. (Patient taking differently: Place 1 application rectally 2 (two) times daily as needed for hemorrhoids or anal itching.) 30 g 1  . losartan (COZAAR) 25 MG tablet Take 1 tablet by mouth daily.    Marland Kitchen lubiprostone (AMITIZA) 24 MCG capsule Take 1 capsule (24 mcg total) by mouth in the morning and at bedtime. 180 capsule 3  . Nutritional Supplements (,FEEDING SUPPLEMENT, PROSOURCE PLUS) liquid Take 30 mLs by mouth 2 (two) times daily between meals.    Marland Kitchen octreotide (SANDOSTATIN) 100 MCG/ML SOLN injection INJECT 1 ML UNDER THE SKIN EVERY 12 HOURS. (Patient taking differently: Inject 100 mcg into the skin every 12 (twelve) hours.) 60 mL 1  . pantoprazole (PROTONIX) 40 MG tablet TAKE 1 TABLET (40 MG TOTAL) BY MOUTH 2 (TWO) TIMES DAILY BEFORE A MEAL. 60 tablet 1  . potassium chloride SA (KLOR-CON) 20 MEQ tablet Take 20 mEq by mouth in the morning, at noon, and at bedtime.    . rosuvastatin (CRESTOR) 10 MG tablet TAKE 1 TABLET BY MOUTH EVERY DAY 30 tablet 0  . vitamin C (ASCORBIC ACID) 250 MG tablet Take 500 mg by mouth daily.     No current facility-administered medications for this visit.    REVIEW OF SYSTEMS:  [X]  denotes positive finding, [ ]  denotes negative finding Cardiac  Comments:  Chest pain or chest pressure:    Shortness of breath upon exertion:    Short of breath when lying flat:    Irregular heart rhythm:        Vascular    Pain in calf, thigh, or hip brought on by ambulation:    Pain in feet at night that wakes  you up from your sleep:     Blood clot in your veins:    Leg swelling:           PHYSICAL EXAM: Vitals:   04/24/21 1251  BP: (!) 155/77  Pulse: 69  Resp: 14  Temp: (!) 97.4 F (36.3 C)  TempSrc: Other (Comment)  SpO2: 99%  Weight: 159 lb (72.1 kg)  Height: 5\' 2"  (1.575 m)    GENERAL: The patient is a well-nourished female, in no acute distress. The vital signs are documented above. CARDIOVASCULAR: Palpable femoral pulses bilaterally.  I do not palpate a pedal pulse on the right.  She PULMONARY: There is good air exchange  MUSCULOSKELETAL: There are no major deformities or cyanosis. NEUROLOGIC: No focal weakness or paresthesias are detected. SKIN: There are no ulcers or rashes noted.  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  Some slight fissure without surrounding erythema on her right posterior heel PSYCHIATRIC: The patient has a normal affect.  DATA:  Patent superficial femoral artery with some elevated velocities at the area of the prior SFA angioplasty  MEDICAL ISSUES: Patient remained stable with healed amputation.  She had presented with gangrene of her fifth toe on the right.  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  I would recommend that conservative observation of this.  She will notify should she develop any new tissue loss.  Otherwise we will see her in 1 year with repeat duplex and ankle arm index    Rosetta Posner, MD FACS [facility] [place] Office Tel [phone]  Note: Portions of this report may have been transcribed using voice recognition software.  Every effort has been made to ensure accuracy; however, inadvertent computerized transcription errors may still be present.

## 2021-04-24 NOTE — Progress Notes (Signed)
[facility] [place]  Patient name: [name] [name] MRN: [id number] DOB: 12-22-1946 Sex: female  REASON FOR VISIT: Follow-up right SFA angioplasty in December 2021 and subsequent right second toe amputation.  HPI: [name] [name] is a 75 y.o. female here for follow-up.  Continues to do well.  She has had no new difficulty and no tissue loss.  She does have some soreness on her right posterior heel.  She reports that her renal function is stabilized and there is no longer any plan for access placement  Past Medical History:  Diagnosis Date  . Anemia   . Chronic kidney disease   . Constipation 07/17/2017  . GERD (gastroesophageal reflux disease)   . Gout   . Grade II diastolic dysfunction 22/0/2542  . Hyperlipidemia   . Hypertension   . Hypokalemia   . OSA (obstructive sleep apnea) 08/09/2020  . PAF (paroxysmal atrial fibrillation) ([place])   . PAF (paroxysmal atrial fibrillation) ([place])   . PUD (peptic ulcer disease)   . Syncope    Neurocardiogenic    Family History  Problem Relation Age of Onset  . Stroke Mother   . Dementia Father   . Cancer - Other Sister   . Atrial fibrillation Sister   . Heart failure Sister   . Colon cancer Maternal Grandmother        diagnosed in her 75s    SOCIAL HISTORY: Social History   Tobacco Use  . Smoking status: Former Smoker    Packs/day: 1.00    Years: 20.00    Pack years: 20.00    Types: Cigarettes    Start date: 03/16/1964    Quit date: 10/07/1997    Years since quitting: 23.5  . Smokeless tobacco: Never Used  Substance Use Topics  . Alcohol use: No    Alcohol/week: 0.0 standard drinks    Allergies  Allergen Reactions  . Acetaminophen-Codeine Hives  . Amlodipine     Felt she retained fluid in her chest   . Amoxicillin Hives    Did it involve swelling of the face/tongue/throat, SOB, or low BP? No Did it involve sudden or severe rash/hives, skin peeling, or any  reaction on the inside of your mouth or nose? No Did you need to seek medical attention at a hospital or doctor's office? No When did it last happen?10 + years If all above answers are "NO", may proceed with cephalosporin use.   Marland Kitchen Doxycycline     " sick and weak"  . Acetaminophen-Codeine Rash  . Allopurinol Hives and Rash  . Tramadol Hives and Rash    Current Outpatient Medications  Medication Sig Dispense Refill  . acetaminophen (TYLENOL) 500 MG tablet Take 1,000 mg by mouth every 6 (six) hours as needed for moderate pain.    Marland Kitchen albuterol (VENTOLIN HFA) 108 (90 Base) MCG/ACT inhaler Inhale 1-2 puffs into the lungs every 6 (six) hours as needed for wheezing or shortness of breath.     Marland Kitchen apixaban (ELIQUIS) 5 MG TABS tablet Take 1 tablet (5 mg total) by mouth 2 (two) times daily. 180 tablet 3  . cetirizine (ZYRTEC) 10 MG tablet Take 10 mg by mouth daily as needed for allergies.    . Cholecalciferol (VITAMIN D-3) 25 MCG (1000 UT) CAPS Take 1 capsule by mouth daily.    . colchicine 0.6 MG tablet Take 1 tablet by mouth as needed (flare up).    . cyclobenzaprine (FLEXERIL) 5 MG tablet Take 5 mg by  mouth 3 (three) times daily as needed for muscle spasms.    . diclofenac Sodium (VOLTAREN) 1 % GEL Apply 1 application topically 4 (four) times daily as needed (pain).    Marland Kitchen ELDERBERRY PO Take 2 capsules by mouth daily.     Marland Kitchen epoetin alfa-epbx (RETACRIT) 3000 UNIT/ML injection Inject 3,000 Units into the skin every 14 (fourteen) days.    . feeding supplement (ENSURE ENLIVE / ENSURE PLUS) LIQD Take 237 mLs by mouth 2 (two) times daily between meals. 237 mL 12  . ferrous sulfate 325 (65 FE) MG tablet Take 325 mg by mouth daily with breakfast.    . flecainide (TAMBOCOR) 50 MG tablet TAKE 1 TABLET BY MOUTH TWICE A DAY (Patient taking differently: Take 50 mg by mouth 2 (two) times daily.) 180 tablet 3  . fluticasone (FLONASE) 50 MCG/ACT nasal spray Place 1 spray into both nostrils daily as needed for  allergies or rhinitis.    . Fluticasone-Salmeterol (ADVAIR) 500-50 MCG/DOSE AEPB Inhale 1 puff into the lungs every 12 (twelve) hours.    . furosemide (LASIX) 40 MG tablet TAKE 1 TABLET BY MOUTH EVERY DAY (Patient taking differently: Take 40 mg by mouth daily.) 90 tablet 1  . gabapentin (NEURONTIN) 300 MG capsule Take 300 mg by mouth 3 (three) times daily as needed (nerve pain).    . hydrocortisone (ANUSOL-HC) 2.5 % rectal cream Place 1 application rectally 2 (two) times daily. (Patient taking differently: Place 1 application rectally 2 (two) times daily as needed for hemorrhoids or anal itching.) 30 g 1  . losartan (COZAAR) 25 MG tablet Take 1 tablet by mouth daily.    Marland Kitchen lubiprostone (AMITIZA) 24 MCG capsule Take 1 capsule (24 mcg total) by mouth in the morning and at bedtime. 180 capsule 3  . Nutritional Supplements (,FEEDING SUPPLEMENT, PROSOURCE PLUS) liquid Take 30 mLs by mouth 2 (two) times daily between meals.    Marland Kitchen octreotide (SANDOSTATIN) 100 MCG/ML SOLN injection INJECT 1 ML UNDER THE SKIN EVERY 12 HOURS. (Patient taking differently: Inject 100 mcg into the skin every 12 (twelve) hours.) 60 mL 1  . pantoprazole (PROTONIX) 40 MG tablet TAKE 1 TABLET (40 MG TOTAL) BY MOUTH 2 (TWO) TIMES DAILY BEFORE A MEAL. 60 tablet 1  . potassium chloride SA (KLOR-CON) 20 MEQ tablet Take 20 mEq by mouth in the morning, at noon, and at bedtime.    . rosuvastatin (CRESTOR) 10 MG tablet TAKE 1 TABLET BY MOUTH EVERY DAY 30 tablet 0  . vitamin C (ASCORBIC ACID) 250 MG tablet Take 500 mg by mouth daily.     No current facility-administered medications for this visit.    REVIEW OF SYSTEMS:  [X]  denotes positive finding, [ ]  denotes negative finding Cardiac  Comments:  Chest pain or chest pressure:    Shortness of breath upon exertion:    Short of breath when lying flat:    Irregular heart rhythm:        Vascular    Pain in calf, thigh, or hip brought on by ambulation:    Pain in feet at night that wakes  you up from your sleep:     Blood clot in your veins:    Leg swelling:           PHYSICAL EXAM: Vitals:   04/24/21 1251  BP: (!) 155/77  Pulse: 69  Resp: 14  Temp: (!) 97.4 F (36.3 C)  TempSrc: Other (Comment)  SpO2: 99%  Weight: 159 lb (72.1 kg)  Height: 5\' 2"  (1.575 m)    GENERAL: The patient is a well-nourished female, in no acute distress. The vital signs are documented above. CARDIOVASCULAR: Palpable femoral pulses bilaterally.  I do not palpate a pedal pulse on the right.  She PULMONARY: There is good air exchange  MUSCULOSKELETAL: There are no major deformities or cyanosis. NEUROLOGIC: No focal weakness or paresthesias are detected. SKIN: There are no ulcers or rashes noted.  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  Some slight fissure without surrounding erythema on her right posterior heel PSYCHIATRIC: The patient has a normal affect.  DATA:  Patent superficial femoral artery with some elevated velocities at the area of the prior SFA angioplasty  MEDICAL ISSUES: Patient remained stable with healed amputation.  She had presented with gangrene of her fifth toe on the right.  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  I would recommend that conservative observation of this.  She will notify should she develop any new tissue loss.  Otherwise we will see her in 1 year with repeat duplex and ankle arm index    Rosetta Posner, MD FACS [facility] [place] Office Tel [phone]  Note: Portions of this report may have been transcribed using voice recognition software.  Every effort has been made to ensure accuracy; however, inadvertent computerized transcription errors may still be present.

## 2021-04-27 ENCOUNTER — Ambulatory Visit (HOSPITAL_COMMUNITY)
Admission: RE | Admit: 2021-04-27 | Discharge: 2021-04-27 | Disposition: A | Discharge status: Home / Self Care | Payer: Medicare HMO | Source: Ambulatory Visit | Attending: Cardiology | Admitting: Cardiology

## 2021-04-27 ENCOUNTER — Other Ambulatory Visit: Payer: Self-pay

## 2021-04-27 DIAGNOSIS — I480 Paroxysmal atrial fibrillation: Secondary | ICD-10-CM | POA: Insufficient documentation

## 2021-04-27 LAB — ECHOCARDIOGRAM COMPLETE
AR max vel: 1.51 cm2
AV Area VTI: 1.48 cm2
AV Area mean vel: 1.62 cm2
AV Mean grad: 7 mmHg
AV Peak grad: 15.1 mmHg
Ao pk vel: 1.94 m/s
Area-P 1/2: 2.19 cm2
MV M vel: 5.9 m/s
MV Peak grad: 139.2 mmHg
MV VTI: 1.04 cm2
S' Lateral: 1.9 cm

## 2021-04-27 NOTE — Progress Notes (Signed)
*  PRELIMINARY RESULTS* Echocardiogram 2D Echocardiogram has been performed.  Samuel Germany 04/27/2021, 4:21 PM

## 2021-05-01 ENCOUNTER — Inpatient Hospital Stay (HOSPITAL_COMMUNITY): Admission: RE | Admit: 2021-05-01 | Payer: Medicare HMO | Source: Ambulatory Visit

## 2021-05-02 ENCOUNTER — Other Ambulatory Visit: Payer: Self-pay | Admitting: *Deleted

## 2021-05-02 ENCOUNTER — Other Ambulatory Visit: Payer: Self-pay

## 2021-05-02 ENCOUNTER — Encounter (HOSPITAL_COMMUNITY)
Admission: RE | Admit: 2021-05-02 | Discharge: 2021-05-02 | Disposition: A | Discharge status: Home / Self Care | Payer: Medicare HMO | Source: Ambulatory Visit | Attending: Nephrology | Admitting: Nephrology

## 2021-05-02 ENCOUNTER — Other Ambulatory Visit (HOSPITAL_COMMUNITY): Payer: Medicare HMO

## 2021-05-02 DIAGNOSIS — K625 Hemorrhage of anus and rectum: Secondary | ICD-10-CM | POA: Diagnosis not present

## 2021-05-02 DIAGNOSIS — D649 Anemia, unspecified: Secondary | ICD-10-CM

## 2021-05-02 DIAGNOSIS — N185 Chronic kidney disease, stage 5: Secondary | ICD-10-CM | POA: Diagnosis not present

## 2021-05-02 LAB — DIFFERENTIAL
Abs Immature Granulocytes: 0.01 10*3/uL (ref 0.00–0.07)
Basophils Absolute: 0 10*3/uL (ref 0.0–0.1)
Basophils Relative: 1 %
Eosinophils Absolute: 0.5 10*3/uL (ref 0.0–0.5)
Eosinophils Relative: 8 %
Immature Granulocytes: 0 %
Lymphocytes Relative: 42 %
Lymphs Abs: 2.3 10*3/uL (ref 0.7–4.0)
Monocytes Absolute: 0.5 10*3/uL (ref 0.1–1.0)
Monocytes Relative: 8 %
Neutro Abs: 2.3 10*3/uL (ref 1.7–7.7)
Neutrophils Relative %: 41 %

## 2021-05-02 LAB — CBC
HCT: 31.3 % — ABNORMAL LOW (ref 36.0–46.0)
Hemoglobin: 9.3 g/dL — ABNORMAL LOW (ref 12.0–15.0)
MCH: 29.8 pg (ref 26.0–34.0)
MCHC: 29.7 g/dL — ABNORMAL LOW (ref 30.0–36.0)
MCV: 100.3 fL — ABNORMAL HIGH (ref 80.0–100.0)
Platelets: 345 10*3/uL (ref 150–400)
RBC: 3.12 MIL/uL — ABNORMAL LOW (ref 3.87–5.11)
RDW: 13.3 % (ref 11.5–15.5)
WBC: 5.6 10*3/uL (ref 4.0–10.5)
nRBC: 0 % (ref 0.0–0.2)

## 2021-05-02 LAB — POCT HEMOGLOBIN-HEMACUE: Hemoglobin: 8.8 g/dL — ABNORMAL LOW (ref 12.0–15.0)

## 2021-05-02 MED ORDER — EPOETIN ALFA-EPBX 3000 UNIT/ML IJ SOLN
3000.0000 [IU] | Freq: Once | INTRAMUSCULAR | Status: AC
  Administered 2021-05-02: 3000 [IU] via SUBCUTANEOUS

## 2021-05-02 MED ORDER — EPOETIN ALFA-EPBX 3000 UNIT/ML IJ SOLN
INTRAMUSCULAR | Status: AC
  Filled 2021-05-02: qty 1

## 2021-05-04 ENCOUNTER — Ambulatory Visit: Admit: 2021-05-04 | Payer: Medicare HMO | Admitting: Vascular Surgery

## 2021-05-04 SURGERY — ARTERIOVENOUS (AV) FISTULA CREATION
Anesthesia: Choice | Laterality: Left

## 2021-05-08 ENCOUNTER — Other Ambulatory Visit (HOSPITAL_COMMUNITY)
Admission: RE | Admit: 2021-05-08 | Discharge: 2021-05-08 | Disposition: A | Discharge status: Home / Self Care | Payer: Medicare HMO | Source: Ambulatory Visit | Attending: Cardiovascular Disease | Admitting: Cardiovascular Disease

## 2021-05-08 ENCOUNTER — Telehealth: Payer: Self-pay | Admitting: Cardiology

## 2021-05-08 DIAGNOSIS — Z20822 Contact with and (suspected) exposure to covid-19: Secondary | ICD-10-CM | POA: Insufficient documentation

## 2021-05-08 DIAGNOSIS — Z01812 Encounter for preprocedural laboratory examination: Secondary | ICD-10-CM | POA: Diagnosis present

## 2021-05-08 LAB — SARS CORONAVIRUS 2 (TAT 6-24 HRS): SARS Coronavirus 2: NEGATIVE

## 2021-05-08 NOTE — Telephone Encounter (Signed)
New Message:     Pt is having a procedure per Dr Quentin Ore on 05-10-21. She wants to know what to do about her Eliquis.

## 2021-05-10 ENCOUNTER — Ambulatory Visit (HOSPITAL_COMMUNITY)
Admission: RE | Admit: 2021-05-10 | Discharge: 2021-05-10 | Disposition: A | Discharge status: Home / Self Care | Payer: Medicare HMO | Attending: Cardiovascular Disease | Admitting: Cardiovascular Disease

## 2021-05-10 ENCOUNTER — Ambulatory Visit (HOSPITAL_COMMUNITY): Payer: Medicare HMO | Admitting: Certified Registered Nurse Anesthetist

## 2021-05-10 ENCOUNTER — Encounter (HOSPITAL_COMMUNITY)
Admission: RE | Disposition: A | Discharge status: Home / Self Care | Payer: Medicare HMO | Source: Home / Self Care | Attending: Cardiovascular Disease

## 2021-05-10 ENCOUNTER — Ambulatory Visit (HOSPITAL_BASED_OUTPATIENT_CLINIC_OR_DEPARTMENT_OTHER): Payer: Medicare HMO

## 2021-05-10 ENCOUNTER — Other Ambulatory Visit: Payer: Self-pay

## 2021-05-10 ENCOUNTER — Encounter (HOSPITAL_COMMUNITY): Payer: Self-pay | Admitting: Cardiovascular Disease

## 2021-05-10 DIAGNOSIS — I129 Hypertensive chronic kidney disease with stage 1 through stage 4 chronic kidney disease, or unspecified chronic kidney disease: Secondary | ICD-10-CM | POA: Insufficient documentation

## 2021-05-10 DIAGNOSIS — I083 Combined rheumatic disorders of mitral, aortic and tricuspid valves: Secondary | ICD-10-CM | POA: Insufficient documentation

## 2021-05-10 DIAGNOSIS — E785 Hyperlipidemia, unspecified: Secondary | ICD-10-CM | POA: Insufficient documentation

## 2021-05-10 DIAGNOSIS — I480 Paroxysmal atrial fibrillation: Secondary | ICD-10-CM | POA: Diagnosis not present

## 2021-05-10 DIAGNOSIS — N189 Chronic kidney disease, unspecified: Secondary | ICD-10-CM | POA: Diagnosis not present

## 2021-05-10 DIAGNOSIS — Z881 Allergy status to other antibiotic agents status: Secondary | ICD-10-CM | POA: Diagnosis not present

## 2021-05-10 DIAGNOSIS — Z79899 Other long term (current) drug therapy: Secondary | ICD-10-CM | POA: Diagnosis not present

## 2021-05-10 DIAGNOSIS — Z87891 Personal history of nicotine dependence: Secondary | ICD-10-CM | POA: Insufficient documentation

## 2021-05-10 DIAGNOSIS — Z8711 Personal history of peptic ulcer disease: Secondary | ICD-10-CM | POA: Insufficient documentation

## 2021-05-10 DIAGNOSIS — I342 Nonrheumatic mitral (valve) stenosis: Secondary | ICD-10-CM

## 2021-05-10 DIAGNOSIS — I350 Nonrheumatic aortic (valve) stenosis: Secondary | ICD-10-CM | POA: Diagnosis not present

## 2021-05-10 DIAGNOSIS — Z8249 Family history of ischemic heart disease and other diseases of the circulatory system: Secondary | ICD-10-CM | POA: Diagnosis not present

## 2021-05-10 DIAGNOSIS — Z7901 Long term (current) use of anticoagulants: Secondary | ICD-10-CM | POA: Diagnosis not present

## 2021-05-10 DIAGNOSIS — Z885 Allergy status to narcotic agent status: Secondary | ICD-10-CM | POA: Insufficient documentation

## 2021-05-10 DIAGNOSIS — Z888 Allergy status to other drugs, medicaments and biological substances status: Secondary | ICD-10-CM | POA: Insufficient documentation

## 2021-05-10 DIAGNOSIS — Z880 Allergy status to penicillin: Secondary | ICD-10-CM | POA: Insufficient documentation

## 2021-05-10 DIAGNOSIS — I340 Nonrheumatic mitral (valve) insufficiency: Secondary | ICD-10-CM | POA: Diagnosis not present

## 2021-05-10 HISTORY — PX: TEE WITHOUT CARDIOVERSION: SHX5443

## 2021-05-10 LAB — ECHO TEE: Area-P 1/2: 2.72 cm2

## 2021-05-10 SURGERY — ECHOCARDIOGRAM, TRANSESOPHAGEAL
Anesthesia: Monitor Anesthesia Care

## 2021-05-10 MED ORDER — BUTAMBEN-TETRACAINE-BENZOCAINE 2-2-14 % EX AERO
INHALATION_SPRAY | CUTANEOUS | Status: DC | PRN
  Administered 2021-05-10: 1 via TOPICAL

## 2021-05-10 MED ORDER — LACTATED RINGERS IV SOLN: INTRAVENOUS | Status: DC | PRN

## 2021-05-10 MED ORDER — SODIUM CHLORIDE 0.9 % IV SOLN: INTRAVENOUS | Status: DC | PRN

## 2021-05-10 MED ORDER — PROPOFOL 500 MG/50ML IV EMUL
INTRAVENOUS | Status: DC | PRN
  Administered 2021-05-10: 75 ug/kg/min via INTRAVENOUS

## 2021-05-10 MED ORDER — GLYCOPYRROLATE PF 0.2 MG/ML IJ SOSY
PREFILLED_SYRINGE | INTRAMUSCULAR | Status: DC | PRN
  Administered 2021-05-10: .2 mg via INTRAVENOUS

## 2021-05-10 NOTE — Anesthesia Procedure Notes (Signed)
Procedure Name: MAC Date/Time: 05/10/2021 11:46 AM Performed by: Darletta Moll, CRNA Pre-anesthesia Checklist: Emergency Drugs available, Patient identified, Suction available and Patient being monitored Patient Re-evaluated:Patient Re-evaluated prior to induction Oxygen Delivery Method: Nasal cannula

## 2021-05-10 NOTE — Progress Notes (Signed)
  Echocardiogram Echocardiogram Transesophageal has been performed.  [name] [name] 05/10/2021, 12:37 PM

## 2021-05-10 NOTE — Interval H&P Note (Signed)
History and Physical Interval Note:  05/10/2021 10:57 AM  [name] [name]  has presented today for surgery, with the diagnosis of AFIB, WATCHMEN EVALUATION.  The various methods of treatment have been discussed with the patient and family. After consideration of risks, benefits and other options for treatment, the patient has consented to  Procedure(s): TRANSESOPHAGEAL ECHOCARDIOGRAM (TEE) WATCHMEN EVALUATION (N/A) as a surgical intervention.  The patient's history has been reviewed, patient examined, no change in status, stable for surgery.  I have reviewed the patient's chart and labs.  Questions were answered to the patient's satisfaction.     Jenkins Rouge

## 2021-05-10 NOTE — Transfer of Care (Signed)
Immediate Anesthesia Transfer of Care Note  Patient: [name] [name]  Procedure(s) Performed: TRANSESOPHAGEAL ECHOCARDIOGRAM (TEE) WATCHMEN EVALUATION (N/A )  Patient Location: PACU and Endoscopy Unit  Anesthesia Type:MAC  Level of Consciousness: drowsy  Airway & Oxygen Therapy: Patient Spontanous Breathing and Patient connected to nasal cannula oxygen  Post-op Assessment: Report given to RN and Post -op Vital signs reviewed and stable  Post vital signs: Reviewed and stable  Last Vitals:  Vitals Value Taken Time  BP    Temp    Pulse 66 05/10/21 1222  Resp 19 05/10/21 1222  SpO2 99 % 05/10/21 1222  Vitals shown include unvalidated device data.  Last Pain:  Vitals:   05/10/21 1117  TempSrc: Oral  PainSc: 0-No pain         Complications: No complications documented.

## 2021-05-10 NOTE — Discharge Instructions (Signed)
Monitored Anesthesia Care Anesthesia refers to techniques, procedures, and medicines that help a person stay safe and comfortable during a medical or dental procedure. Monitored anesthesia care, or sedation, is one type of anesthesia. Your anesthesia specialist may recommend sedation if you will be having a procedure that does not require you to be unconscious. You may have this procedure for:  Cataract surgery.  A dental procedure.  A biopsy.  A colonoscopy. During the procedure, you may receive a medicine to help you relax (sedative). There are three levels of sedation:  Mild sedation. At this level, you may feel awake and relaxed. You will be able to follow directions.  Moderate sedation. At this level, you will be sleepy. You may not remember the procedure.  Deep sedation. At this level, you will be asleep. You will not remember the procedure. The more medicine you are given, the deeper your level of sedation will be. Depending on how you respond to the procedure, the anesthesia specialist may change your level of sedation or the type of anesthesia to fit your needs. An anesthesia specialist will monitor you closely during the procedure. Tell a health care provider about:  Any allergies you have.  All medicines you are taking, including vitamins, herbs, eye drops, creams, and over-the-counter medicines.  Any problems you or family members have had with anesthetic medicines.  Any blood disorders you have.  Any surgeries you have had.  Any medical conditions you have, such as sleep apnea.  Whether you are pregnant or may be pregnant.  Whether you use cigarettes, alcohol, or drugs.  Any use of steroids, whether by mouth or as a cream. What are the risks? Generally, this is a safe procedure. However, problems may occur, including:  Getting too much medicine (oversedation).  Nausea.  Allergic reaction to medicines.  Trouble breathing. If this happens, a breathing tube may  be used to help with breathing. It will be removed when you are awake and breathing on your own.  Heart trouble.  Lung trouble.  Confusion that gets better with time (emergence delirium). What happens before the procedure? Staying hydrated Follow instructions from your health care provider about hydration, which may include:  Up to 2 hours before the procedure - you may continue to drink clear liquids, such as water, clear fruit juice, black coffee, and plain tea. Eating and drinking restrictions Follow instructions from your health care provider about eating and drinking, which may include:  8 hours before the procedure - stop eating heavy meals or foods, such as meat, fried foods, or fatty foods.  6 hours before the procedure - stop eating light meals or foods, such as toast or cereal.  6 hours before the procedure - stop drinking milk or drinks that contain milk.  2 hours before the procedure - stop drinking clear liquids. Medicines Ask your health care provider about:  Changing or stopping your regular medicines. This is especially important if you are taking diabetes medicines or blood thinners.  Taking medicines such as aspirin and ibuprofen. These medicines can thin your blood. Do not take these medicines unless your health care provider tells you to take them.  Taking over-the-counter medicines, vitamins, herbs, and supplements. Tests and exams  You will have a physical exam.  You may have blood tests done to show: ? How well your kidneys and liver are working. ? How well your blood can clot. General instructions  Plan to have a responsible adult take you home from the hospital or  clinic.  If you will be going home right after the procedure, plan to have a responsible adult care for you for the time you are told. This is important. What happens during the procedure?  Your blood pressure, heart rate, breathing, level of pain, and overall condition will be  monitored.  An IV will be inserted into one of your veins.  You will be given medicines as needed to keep you comfortable during the procedure. This may mean changing the level of sedation. ? Depending on your age or the procedure, the sedative may be given:  As a pill that you will swallow or as a pill that is inserted into the rectum.  As an injection into the vein or muscle.  As a spray through the nose.  The procedure will be performed.  Your breathing, heart rate, and blood pressure will be monitored during the procedure.  When the procedure is over, the medicine will be stopped. The procedure may vary among health care providers and hospitals.   What happens after the procedure?  Your blood pressure, heart rate, breathing rate, and blood oxygen level will be monitored until you leave the hospital or clinic.  You may feel sleepy, clumsy, or nauseous.  You may feel forgetful about what happened after the procedure.  You may vomit.  You may continue to get IV fluids.  Do not drive or operate machinery until your health care provider says that it is safe. Summary  Monitored anesthesia care is used to keep a patient comfortable during short procedures.  Tell your health care provider about any allergies or health conditions you have and about all the medicines you are taking.  Before the procedure, follow instructions about when to stop eating and drinking and about changing or stopping any medicines.  Your blood pressure, heart rate, breathing rate, and blood oxygen level will be monitored until you leave the hospital or clinic.  Plan to have a responsible adult take you home from the hospital or clinic. This information is not intended to replace advice given to you by your health care provider. Make sure you discuss any questions you have with your health care provider. Document Revised: 08/25/2020 Document Reviewed: 11/12/2019 Elsevier Patient Education  2021  [place] Heart Disease: A Textbook of Cardiovascular Medicine (11th ed., pp. 474-259). Elsevier.">  Transesophageal Echocardiogram Transesophageal echocardiogram (TEE) is a test that uses sound waves (echocardiogram) to produce very clear, detailed images of the heart and the arteries that lead to and from the heart. A TEE is done by passing a small ultrasound probe attached to a flexible tube down the mouth, through the throat, and into the esophagus. The heart is located in front of the esophagus. A TEE may be done:  To check how well your heart valves are working.  To check for any abnormal growth or tumor.  To look for blood clots.  To check for infection of the lining of the heart (endocarditis).  To evaluate the dividing wall (septum) of the heart and check for a hole that did not close after birth (patent foramen ovale or atrial septal defect).  To help diagnose a tear in the wall of the blood vessels (aortic dissection).  To look at the heart shape, size, and function. Any changes can be associated with certain conditions, including heart failure, aneurysm, and coronary heart disease (CHD).  During cardiac valve surgery. This allows the surgeon to assess the valve repair before closing the chest.  During  a variety of other cardiac procedures to guide positioning of catheters.  To monitor your heart's response to IV fluids or medicine.  To evaluate your heart for a left atrial appendage occluder device. TEE is usually not a painful procedure. You will be given sedation for this procedure. You may feel the probe press against the back of the throat. The probe does not enter the trachea and does not affect your breathing. Tell a health care provider about:  Any allergies you have.  All medicines you are taking, including vitamins, herbs, eye drops, creams, and over-the-counter medicines.  Any problems you or family members have had with anesthetic  medicines.  Any blood disorders you have.  Any surgeries you have had.  Any medical conditions you have.  Any swallowing difficulties.  Whether you have or have had a blockage of the esophagus (esophageal obstruction).  Whether you are pregnant or may be pregnant. What are the risks? Generally, this is a safe procedure. However, problems may occur, including:  Damage to nearby structures or organs.  A tear of the esophagus.  Irregular heartbeat.  Hoarse voice or difficulty swallowing.  Bleeding. What happens before the procedure? Medicines Ask your health care provider about:  Changing or stopping your regular medicines. This is especially important if you are taking diabetes medicines or blood thinners.  Taking medicines such as aspirin and ibuprofen. These medicines can thin your blood. Do not take these medicines unless your health care provider tells you to take them.  Taking over-the-counter medicines, vitamins, herbs, and supplements. General instructions  Follow instructions from your health care provider about eating or drinking restrictions.  You will need to remove any dentures or dental retainers.  Plan to have a responsible adult take you home from the hospital or clinic.  Plan to have a responsible adult care for you for the time you are told after you leave the hospital or clinic. This is important. What happens during the procedure?  An IV will be inserted into one of your veins.  You will be given one or more of the following: ? A medicine to help you relax (sedative). ? A medicine that is sprayed or gargled to numb the back of your throat (local anesthetic).  Your blood pressure, heart rate, and breathing will be monitored during the procedure.  You may be asked to lie on your left side.  A bite block will be placed in your mouth to keep you from biting the flexible tube during the procedure.  The probe will be placed into the back of your  mouth. You will be asked to swallow. This helps to pass the tip of the probe into the esophagus.  Once the tip of the probe is in the correct area, the probe sends sound waves to your heart and collects the echoes that bounce back. These echoes become pictures that show up on a video monitor.  When the health care provider is finished taking pictures of your heart, the probe and bite block will be removed. The procedure may vary among health care providers and hospitals.   What can I expect after the procedure?  Your blood pressure, heart rate, breathing rate, and blood oxygen level will be monitored until you leave the hospital or clinic.  When you first wake up, your throat may feel slightly sore and will probably still feel numb. This will improve slowly over time. You will not be allowed to eat or drink until the numbness has gone  away as you could choke.  It is common to have a sore throat for a day or two after the procedure.  It is up to you to get the results of your procedure. Ask your health care provider, or the department that is doing the procedure, when your results will be ready. Follow these instructions at home:  If you were given a sedative during the procedure, it can affect you for several hours. Do not drive or operate machinery until your health care provider says that it is safe.  Return to your normal activities as told by your health care provider. Ask your health care provider what activities are safe for you.  Keep all follow-up visits. This is important. Summary  Transesophageal echocardiogram (TEE) is a test that uses sound waves (echocardiogram) to produce very clear, detailed images of the heart.  TEE is done by passing a small ultrasound probe attached to a flexible tube down the esophagus.  Generally, this is a safe procedure. However, problems may occur, including damage to nearby structures or organs, bleeding, irregular heartbeat, and a hoarse voice or  trouble swallowing.  Tell your health care provider about any medicines you are taking and any medical conditions you have, including swallowing difficulties. This information is not intended to replace advice given to you by your health care provider. Make sure you discuss any questions you have with your health care provider. Document Revised: 08/16/2020 Document Reviewed: 08/02/2020 Elsevier Patient Education  2021 [employer].

## 2021-05-10 NOTE — Anesthesia Preprocedure Evaluation (Signed)
Anesthesia Evaluation  Patient identified by MRN, date of birth, ID band Patient awake    Reviewed: Allergy & Precautions, NPO status , Patient's Chart, lab work & pertinent test results, reviewed documented beta blocker date and time   Airway Mallampati: I  TM Distance: >3 FB Neck ROM: Full    Dental  (+) Missing, Dental Advisory Given, Poor Dentition,    Pulmonary sleep apnea and Continuous Positive Airway Pressure Ventilation , former smoker,    Pulmonary exam normal breath sounds clear to auscultation       Cardiovascular hypertension, Pt. on medications and Pt. on home beta blockers Normal cardiovascular exam+ dysrhythmias Atrial Fibrillation + Valvular Problems/Murmurs MR  Rhythm:Regular Rate:Normal  Echo 04/2021 1. Left ventricular ejection fraction, by estimation, is 70 to 75%. The left ventricle has hyperdynamic function. The left ventricle has no regional wall motion abnormalities. There is mild left ventricular hypertrophy. Left ventricular diastolic parameters are indeterminate.  2. Right ventricular systolic function is normal. The right ventricular size is normal. There is moderately elevated pulmonary artery systolic pressure. The estimated right ventricular systolic pressure is 94.7 mmHg.  3. Left atrial size was severely dilated.  4. The mitral valve is abnormal. Moderate mitral valve regurgitation. Mild mitral stenosis. Valve area by planimetry 1.55 cm2. The mean mitral valve gradient is 5.0 mmHg. Severe mitral annular calcification.  5. The aortic valve is tricuspid. There is moderate calcification of the aortic valve. Aortic valve regurgitation is not visualized. Moderately sclerotic to mildly stenotic valve. Aortic valve area, by VTI measures 1.48 cm. Aortic valve mean gradient measures 7.0 mmHg.  6. The inferior vena cava is normal in size with greater than 50% respiratory variability, suggesting right atrial  pressure of 3 mmHg.    Neuro/Psych negative neurological ROS  negative psych ROS   GI/Hepatic Neg liver ROS, PUD, GERD  Medicated and Controlled,  Endo/Other  negative endocrine ROS  Renal/GU Renal InsufficiencyRenal disease (Cr 2.64, K 3.6)  negative genitourinary   Musculoskeletal negative musculoskeletal ROS (+)   Abdominal   Peds  Hematology  (+) Blood dyscrasia (on eliquis, Hgb 9.1), anemia ,   Anesthesia Other Findings   Reproductive/Obstetrics                             Anesthesia Physical  Anesthesia Plan  ASA: III  Anesthesia Plan: MAC   Post-op Pain Management:    Induction: Intravenous  PONV Risk Score and Plan: 2 and Propofol infusion, Treatment may vary due to age or medical condition and Ondansetron  Airway Management Planned: Natural Airway  Additional Equipment: None  Intra-op Plan:   Post-operative Plan:   Informed Consent: I have reviewed the patients History and Physical, chart, labs and discussed the procedure including the risks, benefits and alternatives for the proposed anesthesia with the patient or authorized representative who has indicated his/her understanding and acceptance.     Dental advisory given  Plan Discussed with: CRNA  Anesthesia Plan Comments:         Anesthesia Quick Evaluation

## 2021-05-11 NOTE — Anesthesia Postprocedure Evaluation (Signed)
Anesthesia Post Note  Patient: [name] [name]  Procedure(s) Performed: TRANSESOPHAGEAL ECHOCARDIOGRAM (TEE) WATCHMEN EVALUATION (N/A )     Patient location during evaluation: PACU Anesthesia Type: MAC Level of consciousness: awake and alert Pain management: pain level controlled Vital Signs Assessment: post-procedure vital signs reviewed and stable Respiratory status: spontaneous breathing Cardiovascular status: stable Anesthetic complications: no   No complications documented.  Last Vitals:  Vitals:   05/10/21 1247 05/10/21 1257  BP: (!) 133/46 (!) 162/54  Pulse: 65   Resp: 20   Temp:    SpO2: 100%     Last Pain:  Vitals:   05/10/21 1240  TempSrc:   PainSc: 0-No pain                 Nolon Nations

## 2021-05-12 ENCOUNTER — Encounter (HOSPITAL_COMMUNITY): Payer: Self-pay | Admitting: Cardiovascular Disease

## 2021-05-15 ENCOUNTER — Other Ambulatory Visit: Payer: Self-pay | Admitting: Family Medicine

## 2021-05-16 ENCOUNTER — Encounter (HOSPITAL_COMMUNITY): Payer: Self-pay

## 2021-05-16 ENCOUNTER — Other Ambulatory Visit: Payer: Self-pay

## 2021-05-16 ENCOUNTER — Encounter (HOSPITAL_COMMUNITY)
Admission: RE | Admit: 2021-05-16 | Discharge: 2021-05-16 | Disposition: A | Discharge status: Home / Self Care | Payer: Medicare HMO | Source: Ambulatory Visit | Attending: Nephrology | Admitting: Nephrology

## 2021-05-16 DIAGNOSIS — N185 Chronic kidney disease, stage 5: Secondary | ICD-10-CM | POA: Diagnosis not present

## 2021-05-16 LAB — POCT HEMOGLOBIN-HEMACUE: Hemoglobin: 8.8 g/dL — ABNORMAL LOW (ref 12.0–15.0)

## 2021-05-16 MED ORDER — EPOETIN ALFA-EPBX 3000 UNIT/ML IJ SOLN
INTRAMUSCULAR | Status: AC
  Filled 2021-05-16: qty 1

## 2021-05-16 MED ORDER — EPOETIN ALFA-EPBX 3000 UNIT/ML IJ SOLN
3000.0000 [IU] | Freq: Once | INTRAMUSCULAR | Status: AC
Start: 2021-05-16 — End: 2021-05-16
  Administered 2021-05-16: 3000 [IU] via SUBCUTANEOUS

## 2021-05-17 ENCOUNTER — Telehealth (INDEPENDENT_AMBULATORY_CARE_PROVIDER_SITE_OTHER): Payer: Medicare HMO | Admitting: Cardiology

## 2021-05-17 DIAGNOSIS — N184 Chronic kidney disease, stage 4 (severe): Secondary | ICD-10-CM | POA: Diagnosis not present

## 2021-05-17 DIAGNOSIS — I480 Paroxysmal atrial fibrillation: Secondary | ICD-10-CM

## 2021-05-17 DIAGNOSIS — K922 Gastrointestinal hemorrhage, unspecified: Secondary | ICD-10-CM

## 2021-05-17 NOTE — Progress Notes (Signed)
Virtual Visit via Telephone Note   This visit type was conducted due to national recommendations for restrictions regarding the COVID-19 Pandemic (e.g. social distancing) in an effort to limit this patient's exposure and mitigate transmission in our community.  Due to her co-morbid illnesses, this patient is at least at moderate risk for complications without adequate follow up.  This format is felt to be most appropriate for this patient at this time.  The patient did not have access to video technology/had technical difficulties with video requiring transitioning to audio format only (telephone).  All issues noted in this document were discussed and addressed.  No physical exam could be performed with this format.  Please refer to the patient's chart for her  consent to telehealth for [facility].    Date:  05/17/2021   ID:  [name] [name], [name] [name] 05/11/1946, MRN [id number] The patient was identified using 2 identifiers.  Patient Location: Home Provider Location: Office/Clinic   PCP:  Andres Shad, MD   [place] [facility] Cardiologist:  Rozann Lesches, MD Electrophysiologist:  Vickie Epley, MD {    Evaluation Performed:  Follow-Up Visit  Chief Complaint:  Atrial fibrillation, GI bleeding  History of Present Illness:    [name] [name] is a 75 y.o. female who presents for a follow up visit.  I last saw the patient March 30, 2021 for her atrial fibrillation and history of GI bleeding.  At her appointment we discussed the watchman device as a tool for her to avoid long-term exposure to anticoagulation given her history of GI bleeding.  As part of the preprocedure work-up, she had a transesophageal echocardiogram.  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  She presents to  discuss the TEE results.     Past Medical History:  Diagnosis Date  . Anemia   . Chronic kidney disease   . Constipation 07/17/2017  . GERD (gastroesophageal reflux disease)   . Gout   . Grade II diastolic dysfunction 99/07/3381  . Hyperlipidemia   . Hypertension   . Hypokalemia   . OSA (obstructive sleep apnea) 08/09/2020  . PAF (paroxysmal atrial fibrillation) ([place])   . PAF (paroxysmal atrial fibrillation) ([place])   . PUD (peptic ulcer disease)   . Syncope    Neurocardiogenic   Past Surgical History:  Procedure Laterality Date  . ABDOMINAL AORTOGRAM W/LOWER EXTREMITY Right 12/13/2020   Procedure: ABDOMINAL AORTOGRAM W/LOWER EXTREMITY;  Surgeon: Serafina Mitchell, MD;  Location: [place] [facility];  Service: Cardiovascular;  Laterality: Right;  . ABDOMINAL HYSTERECTOMY    . AMPUTATION Right 12/14/2020   Procedure: Right fifth toe amputation;  Surgeon: Rosetta Posner, MD;  Location: [place];  Service: Vascular;  Laterality: Right;  . CATARACT EXTRACTION Bilateral   . COLONOSCOPY N/A 10/07/2014   Dr. Oneida Alar: redundant left colon, moderate sized external hemorrhoids   . COLONOSCOPY N/A 03/11/2020   Surgeon: Danie Binder, MD;  9 polyps, majority of which were tubular adenomas, nodular mucosa in the anus s/p biopsy which was benign.  . 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  . ESOPHAGOGASTRODUODENOSCOPY (EGD) WITH PROPOFOL N/A 11/25/2020    Surgeon: Daneil Dolin, MD; Normal, s/p capsule  deployment  . 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   Marland Kitchen HOT HEMOSTASIS  11/27/2020   Procedure: HOT HEMOSTASIS (ARGON PLASMA COAGULATION/BICAP);  Surgeon: Montez Morita, Quillian Quince, MD;  [facility];  Service:  Gastroenterology;;  . LIPOMA RESECTION    . PERIPHERAL VASCULAR INTERVENTION Right 12/13/2020   Procedure: PERIPHERAL VASCULAR INTERVENTION;  Surgeon: Serafina Mitchell, MD;  Location: [place] [facility];  Service: Cardiovascular;  Laterality: Right;  SFA/PT/AT  . POLYPECTOMY  03/11/2020   Procedure: POLYPECTOMY;  Surgeon: Danie Binder, MD;  [facility]: [facility];  Service: Endoscopy;;  cecal, transverse, descending, sigmoid  . TEE WITHOUT CARDIOVERSION N/A 05/10/2021   Procedure: TRANSESOPHAGEAL ECHOCARDIOGRAM (TEE) WATCHMEN EVALUATION;  Surgeon: Josue Hector, MD;  Location[facility];  Service: Cardiovascular;  Laterality: N/A;     No outpatient medications have been marked as taking for the 05/17/21 encounter (Telemedicine) with Vickie Epley, MD.     Allergies:   Amlodipine, Amoxicillin, Doxycycline, Acetaminophen-codeine, Allopurinol, and Tramadol   Social History   Tobacco Use  . Smoking status: Former Smoker    Packs/day: 1.00    Years: 20.00    Pack years: 20.00    Types: Cigarettes    Start date: 03/16/1964    Quit date: 10/07/1997    Years since quitting: 23.6  . Smokeless tobacco: Never Used  Vaping Use  . Vaping Use: Never used  Substance Use Topics  . Alcohol use: No    Alcohol/week: 0.0 standard drinks  . Drug use: No     Family Hx: The patient's family history includes Atrial fibrillation in her sister; Cancer - Other in her sister; Colon cancer in her maternal grandmother; Dementia in her father; Heart failure in her sister; Stroke in her mother.  ROS:   Please see the history of present illness.     All other systems reviewed and are negative.   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  Recent Labs: 10/20/2020: B Natriuretic Peptide  893.0 12/08/2020: Magnesium 2.2 04/04/2021: ALT 9; BUN 33; Creatinine, Ser 2.34; Potassium 4.8; Sodium 140 05/02/2021: Platelets 345 05/16/2021: Hemoglobin 8.8   Recent Lipid Panel No results found for: CHOL, TRIG, HDL, CHOLHDL, LDLCALC, LDLDIRECT  Wt Readings from Last 3 Encounters:  05/16/21 158 lb (71.7 kg)  05/10/21 158 lb (71.7 kg)  04/24/21 159 lb (72.1 kg)       Objective:    Vital Signs:  There were no vitals taken for this visit.     ASSESSMENT & PLAN:     1. Paroxysmal atrial fibrillation Maintained on flecainide which seems to be effective in controlling her rhythm.  She is on Eliquis for stroke prophylaxis.  Her therapy with Eliquis has been complicated by recurrent GI bleeding without a clear source.  We originally planned on performing a watchman device but after the transesophageal echo demonstrated right atrial lead and severe MAC, I do not think that it is safe to proceed with this procedure at this time.  The transseptal puncture is complicated by the right atrial lead and the severity of her MAC makes manipulation within the left atrium not safe.  2.  Stage IV CKD Followed by nephrology  3.  GI bleeding history On Eliquis. Has further evaluation for her GI bleeding scheduled for later this month.    Time:   Today, I have spent 10 minutes with the patient with telehealth technology discussing the above problems.     Medication Adjustments/Labs and Tests Ordered: Current medicines are reviewed at length with the patient today.  Concerns regarding medicines are outlined above.   Tests Ordered: No orders of the defined types were placed in this encounter.   Medication Changes: No orders of the defined types were placed in this encounter.   Follow Up:  In Person prn  Signed, Vickie Epley, MD  05/17/2021 9:33 AM    [place]

## 2021-05-17 NOTE — Patient Instructions (Addendum)

## 2021-05-30 ENCOUNTER — Other Ambulatory Visit: Payer: Self-pay

## 2021-05-30 ENCOUNTER — Encounter (HOSPITAL_COMMUNITY): Payer: Self-pay

## 2021-05-30 ENCOUNTER — Encounter (HOSPITAL_COMMUNITY)
Admission: RE | Admit: 2021-05-30 | Discharge: 2021-05-30 | Disposition: A | Discharge status: Home / Self Care | Payer: Medicare HMO | Source: Ambulatory Visit | Attending: Nephrology | Admitting: Nephrology

## 2021-05-30 DIAGNOSIS — N185 Chronic kidney disease, stage 5: Secondary | ICD-10-CM | POA: Diagnosis present

## 2021-05-30 LAB — CBC WITH DIFFERENTIAL/PLATELET
Abs Immature Granulocytes: 0.01 10*3/uL (ref 0.00–0.07)
Basophils Absolute: 0 10*3/uL (ref 0.0–0.1)
Basophils Relative: 1 %
Eosinophils Absolute: 0.4 10*3/uL (ref 0.0–0.5)
Eosinophils Relative: 7 %
HCT: 31 % — ABNORMAL LOW (ref 36.0–46.0)
Hemoglobin: 9.3 g/dL — ABNORMAL LOW (ref 12.0–15.0)
Immature Granulocytes: 0 %
Lymphocytes Relative: 44 %
Lymphs Abs: 2.3 10*3/uL (ref 0.7–4.0)
MCH: 29.7 pg (ref 26.0–34.0)
MCHC: 30 g/dL (ref 30.0–36.0)
MCV: 99 fL (ref 80.0–100.0)
Monocytes Absolute: 0.6 10*3/uL (ref 0.1–1.0)
Monocytes Relative: 11 %
Neutro Abs: 1.9 10*3/uL (ref 1.7–7.7)
Neutrophils Relative %: 37 %
Platelets: 328 10*3/uL (ref 150–400)
RBC: 3.13 MIL/uL — ABNORMAL LOW (ref 3.87–5.11)
RDW: 12.7 % (ref 11.5–15.5)
WBC: 5.2 10*3/uL (ref 4.0–10.5)
nRBC: 0 % (ref 0.0–0.2)

## 2021-05-30 LAB — POCT HEMOGLOBIN-HEMACUE: Hemoglobin: 9 g/dL — ABNORMAL LOW (ref 12.0–15.0)

## 2021-05-30 MED ORDER — EPOETIN ALFA-EPBX 3000 UNIT/ML IJ SOLN
3000.0000 [IU] | Freq: Once | INTRAMUSCULAR | Status: DC
Start: 2021-05-30 — End: 2021-05-31

## 2021-05-30 MED ORDER — EPOETIN ALFA-EPBX 3000 UNIT/ML IJ SOLN
INTRAMUSCULAR | Status: AC
  Administered 2021-05-30: 3000 [IU] via SUBCUTANEOUS
  Filled 2021-05-30: qty 1

## 2021-05-31 ENCOUNTER — Other Ambulatory Visit (HOSPITAL_COMMUNITY)
Admission: RE | Admit: 2021-05-31 | Discharge: 2021-05-31 | Disposition: A | Discharge status: Home / Self Care | Payer: Medicare HMO | Source: Ambulatory Visit | Attending: Nephrology | Admitting: Nephrology

## 2021-05-31 DIAGNOSIS — R809 Proteinuria, unspecified: Secondary | ICD-10-CM | POA: Diagnosis present

## 2021-05-31 DIAGNOSIS — D508 Other iron deficiency anemias: Secondary | ICD-10-CM | POA: Diagnosis present

## 2021-05-31 DIAGNOSIS — I129 Hypertensive chronic kidney disease with stage 1 through stage 4 chronic kidney disease, or unspecified chronic kidney disease: Secondary | ICD-10-CM | POA: Diagnosis present

## 2021-05-31 DIAGNOSIS — N184 Chronic kidney disease, stage 4 (severe): Secondary | ICD-10-CM | POA: Insufficient documentation

## 2021-05-31 DIAGNOSIS — K922 Gastrointestinal hemorrhage, unspecified: Secondary | ICD-10-CM | POA: Diagnosis present

## 2021-05-31 DIAGNOSIS — D638 Anemia in other chronic diseases classified elsewhere: Secondary | ICD-10-CM | POA: Diagnosis present

## 2021-05-31 DIAGNOSIS — E211 Secondary hyperparathyroidism, not elsewhere classified: Secondary | ICD-10-CM | POA: Insufficient documentation

## 2021-05-31 LAB — RENAL FUNCTION PANEL
Albumin: 4 g/dL (ref 3.5–5.0)
Anion gap: 6 (ref 5–15)
BUN: 22 mg/dL (ref 8–23)
CO2: 29 mmol/L (ref 22–32)
Calcium: 9.1 mg/dL (ref 8.9–10.3)
Chloride: 104 mmol/L (ref 98–111)
Creatinine, Ser: 1.87 mg/dL — ABNORMAL HIGH (ref 0.44–1.00)
GFR, Estimated: 28 mL/min — ABNORMAL LOW (ref >60)
Glucose, Bld: 99 mg/dL (ref 70–99)
Phosphorus: 3.5 mg/dL (ref 2.5–4.6)
Potassium: 4.3 mmol/L (ref 3.5–5.1)
Sodium: 139 mmol/L (ref 135–145)

## 2021-05-31 LAB — FERRITIN: Ferritin: 23 ng/mL (ref 11–307)

## 2021-05-31 LAB — IRON AND TIBC
Iron: 180 ug/dL — ABNORMAL HIGH (ref 28–170)
Saturation Ratios: 49 % — ABNORMAL HIGH (ref 10.4–31.8)
TIBC: 369 ug/dL (ref 250–450)
UIBC: 189 ug/dL

## 2021-05-31 LAB — PROTEIN / CREATININE RATIO, URINE
Creatinine, Urine: 33.11 mg/dL
Protein Creatinine Ratio: 0.18 mg/mg{Cre} — ABNORMAL HIGH (ref 0.00–0.15)
Total Protein, Urine: 6 mg/dL

## 2021-06-01 NOTE — Progress Notes (Signed)
Phoned and advised the pt of her result note and further recommendations of bloodwork to be discussed at her upcoming visit with Dr. Abbey Chatters. Advised of seeing changes in her stool. Pt agreed

## 2021-06-05 ENCOUNTER — Other Ambulatory Visit: Payer: Self-pay | Admitting: Gastroenterology

## 2021-06-05 ENCOUNTER — Encounter: Payer: Self-pay | Admitting: Internal Medicine

## 2021-06-07 ENCOUNTER — Other Ambulatory Visit: Payer: Self-pay | Admitting: Cardiology

## 2021-06-07 NOTE — Telephone Encounter (Signed)
Prescription refill request for Eliquis received. Indication: PAF Last office visit: 05/17/21  C. Lambert Scr: 1.87 on 05/31/21 Age: 75 Weight: 73.9kg  Based on above findings Eliquis 5mg  twice daily is the appropriate dose.  Refill approved.

## 2021-06-13 ENCOUNTER — Other Ambulatory Visit: Payer: Self-pay

## 2021-06-13 ENCOUNTER — Encounter (HOSPITAL_COMMUNITY)
Admission: RE | Admit: 2021-06-13 | Discharge: 2021-06-13 | Disposition: A | Discharge status: Home / Self Care | Payer: Medicare HMO | Source: Ambulatory Visit | Attending: Nephrology | Admitting: Nephrology

## 2021-06-13 ENCOUNTER — Encounter: Payer: Self-pay | Admitting: Internal Medicine

## 2021-06-13 ENCOUNTER — Ambulatory Visit: Payer: Medicare HMO | Admitting: Internal Medicine

## 2021-06-13 VITALS — BP 138/64 | HR 69 | Ht 62.0 in | Wt 165.4 lb

## 2021-06-13 DIAGNOSIS — N185 Chronic kidney disease, stage 5: Secondary | ICD-10-CM | POA: Diagnosis not present

## 2021-06-13 DIAGNOSIS — I480 Paroxysmal atrial fibrillation: Secondary | ICD-10-CM

## 2021-06-13 LAB — POCT HEMOGLOBIN-HEMACUE: Hemoglobin: 9.3 g/dL — ABNORMAL LOW (ref 12.0–15.0)

## 2021-06-13 MED ORDER — EPOETIN ALFA-EPBX 3000 UNIT/ML IJ SOLN
3000.0000 [IU] | Freq: Once | INTRAMUSCULAR | Status: AC
  Administered 2021-06-13: 3000 [IU] via SUBCUTANEOUS

## 2021-06-13 MED ORDER — EPOETIN ALFA-EPBX 3000 UNIT/ML IJ SOLN
INTRAMUSCULAR | Status: AC
  Filled 2021-06-13: qty 1

## 2021-06-13 NOTE — Progress Notes (Signed)
HPI [name] returns today for followup of her atrial fib and HTN. She is a pleasant 75 yo woman with PAF, who I saw a couple of months ago. She was started on flecainide 75 mg twice daily but reduced to 50 mg twice daily. Her symptoms of atrial fib have essentially resolved. She denies chest pain or sob. She saw Dr.Lambert in consultation about a Watchman and he thought she was not a good candidate. She has gotten back on eliquis but has not had any bleeding. She denies any symptomatic atrial fib.  Allergies  Allergen Reactions   Amlodipine     Felt she retained fluid in her chest    Amoxicillin Hives    Did it involve swelling of the face/tongue/throat, SOB, or low BP? No Did it involve sudden or severe rash/hives, skin peeling, or any reaction on the inside of your mouth or nose? No Did you need to seek medical attention at a hospital or doctor's office? No When did it last happen?      10 + years If all above answers are "NO", may proceed with cephalosporin use.    Doxycycline     " sick and weak"   Acetaminophen-Codeine Hives and Rash   Allopurinol Hives and Rash   Tramadol Hives and Rash     Current Outpatient Medications  Medication Sig Dispense Refill   acetaminophen (TYLENOL) 500 MG tablet Take 1,000 mg by mouth every 6 (six) hours as needed for moderate pain.     albuterol (VENTOLIN HFA) 108 (90 Base) MCG/ACT inhaler Inhale 1-2 puffs into the lungs every 6 (six) hours as needed for wheezing or shortness of breath.      apixaban (ELIQUIS) 5 MG TABS tablet TAKE 1 TABLET BY MOUTH TWICE A DAY 180 tablet 1   cetirizine (ZYRTEC) 10 MG tablet Take 10 mg by mouth daily as needed for allergies.     Cholecalciferol (VITAMIN D-3) 25 MCG (1000 UT) CAPS Take 1,000 Units by mouth daily.     colchicine 0.6 MG tablet Take 1 tablet by mouth daily as needed (gout flare).     cyclobenzaprine (FLEXERIL) 5 MG tablet Take 5 mg by mouth 3 (three) times daily as needed for muscle spasms.      diclofenac Sodium (VOLTAREN) 1 % GEL Apply 1 application topically 4 (four) times daily as needed (pain).     ELDERBERRY PO Take 2 capsules by mouth daily.      epoetin alfa-epbx (RETACRIT) 3000 UNIT/ML injection Inject 3,000 Units into the skin every 14 (fourteen) days.     feeding supplement (ENSURE ENLIVE / ENSURE PLUS) LIQD Take 237 mLs by mouth 2 (two) times daily between meals. 237 mL 12   ferrous sulfate 325 (65 FE) MG tablet Take 325 mg by mouth daily with breakfast.     flecainide (TAMBOCOR) 50 MG tablet TAKE 1 TABLET BY MOUTH TWICE A DAY 180 tablet 3   fluticasone (FLONASE) 50 MCG/ACT nasal spray Place 1 spray into both nostrils daily as needed for allergies or rhinitis.     Fluticasone-Salmeterol (ADVAIR) 500-50 MCG/DOSE AEPB Inhale 1 puff into the lungs every 12 (twelve) hours.     furosemide (LASIX) 40 MG tablet TAKE 1 TABLET BY MOUTH EVERY DAY 90 tablet 1   gabapentin (NEURONTIN) 300 MG capsule Take 300 mg by mouth 3 (three) times daily as needed (nerve pain).     hydrocortisone (ANUSOL-HC) 2.5 % rectal cream Place 1 application rectally 2 (  two) times daily. 30 g 1   losartan (COZAAR) 25 MG tablet Take 25 mg by mouth daily.     lubiprostone (AMITIZA) 24 MCG capsule Take 1 capsule (24 mcg total) by mouth in the morning and at bedtime. 180 capsule 3   octreotide (SANDOSTATIN) 100 MCG/ML SOLN injection Inject 1 mL (100 mcg total) into the skin every 12 (twelve) hours. 60 mL 1   pantoprazole (PROTONIX) 40 MG tablet TAKE 1 TABLET (40 MG TOTAL) BY MOUTH 2 (TWO) TIMES DAILY BEFORE A MEAL. 60 tablet 1   potassium chloride SA (KLOR-CON) 20 MEQ tablet Take 20 mEq by mouth in the morning, at noon, and at bedtime.     rosuvastatin (CRESTOR) 10 MG tablet TAKE 1 TABLET BY MOUTH EVERY DAY 30 tablet 0   vitamin C (ASCORBIC ACID) 250 MG tablet Take 500 mg by mouth daily.     No current facility-administered medications for this visit.   Facility-Administered Medications Ordered in Other Visits   Medication Dose Route Frequency Provider Last Rate Last Admin   epoetin alfa-epbx (RETACRIT) 3000 UNIT/ML injection              Past Medical History:  Diagnosis Date   Anemia    Chronic kidney disease    Constipation 07/17/2017   GERD (gastroesophageal reflux disease)    Gout    Grade II diastolic dysfunction 83/05/6293   Hyperlipidemia    Hypertension    Hypokalemia    OSA (obstructive sleep apnea) 08/09/2020   PAF (paroxysmal atrial fibrillation) (HCC)    PAF (paroxysmal atrial fibrillation) (HCC)    PUD (peptic ulcer disease)    Syncope    Neurocardiogenic    ROS:   All systems reviewed and negative except as noted in the HPI.   Past Surgical History:  Procedure Laterality Date   ABDOMINAL AORTOGRAM W/LOWER EXTREMITY Right 12/13/2020   Procedure: ABDOMINAL AORTOGRAM W/LOWER EXTREMITY;  Surgeon: Serafina Mitchell, MD;  Location: [facility];  Service: Cardiovascular;  Laterality: Right;   ABDOMINAL HYSTERECTOMY     AMPUTATION Right 12/14/2020   Procedure: Right fifth toe amputation;  Surgeon: Rosetta Posner, MD;  Location: [facility];  Service: Vascular;  Laterality: Right;   CATARACT EXTRACTION Bilateral    COLONOSCOPY N/A 10/07/2014   Dr. Oneida Alar: redundant left colon, moderate sized external hemorrhoids    COLONOSCOPY N/A 03/11/2020   Surgeon: Danie Binder, MD;  9 polyps, majority of which were tubular adenomas, nodular mucosa in the anus s/p biopsy which was benign.   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   ESOPHAGOGASTRODUODENOSCOPY (EGD) WITH PROPOFOL N/A 11/25/2020    Surgeon: Daneil Dolin, MD; Normal, s/p capsule deployment   [place] N/A 11/25/2020    Surgeon: Daneil Dolin, MD; 4/nonbleeding AVMs in the proximal small bowel large fresh blood, 2 bleeding lymphangiectasis in SB.    HOT HEMOSTASIS   11/27/2020   Procedure: HOT HEMOSTASIS (ARGON PLASMA COAGULATION/BICAP);  Surgeon: Montez Morita, Quillian Quince, MD;  [facility];  Service: Gastroenterology;;   LIPOMA RESECTION     PERIPHERAL VASCULAR INTERVENTION Right 12/13/2020   Procedure: PERIPHERAL VASCULAR INTERVENTION;  Surgeon: Serafina Mitchell, MD;  Location: [facility];  Service: Cardiovascular;  Laterality: Right;  SFA/PT/AT   POLYPECTOMY  03/11/2020   Procedure: POLYPECTOMY;  Surgeon: Danie Binder, MD;  [facility];  Service: Endoscopy;;  cecal, transverse, descending, sigmoid   TEE WITHOUT CARDIOVERSION N/A 05/10/2021   Procedure: TRANSESOPHAGEAL ECHOCARDIOGRAM (TEE) WATCHMEN EVALUATION;  Surgeon: Josue Hector, MD;  Location: [facility];  Service: Cardiovascular;  Laterality: N/A;     Family History  Problem Relation Age of Onset   Stroke Mother    Dementia Father    Cancer - Other Sister    Atrial fibrillation Sister    Heart failure Sister    Colon cancer Maternal Grandmother        diagnosed in her 62s     Social History   Socioeconomic History   Marital status: Single    Spouse name: Not on file   Number of children: Not on file   Years of education: Not on file   Highest education level: Not on file  Occupational History   Occupation: Retired  Tobacco Use   Smoking status: Former    Packs/day: 1.00    Years: 20.00    Pack years: 20.00    Types: Cigarettes    Start date: 03/16/1964    Quit date: 10/07/1997    Years since quitting: 23.6   Smokeless tobacco: Never  Vaping Use   Vaping Use: Never used  Substance and Sexual Activity   Alcohol use: No    Alcohol/week: 0.0 standard drinks   Drug use: No   Sexual activity: Yes    Partners: Male  Other Topics Concern   Not on file  Social History Narrative   Not on file   Social Determinants of Health   Financial Resource Strain: Not on file  Food Insecurity: Not on file  Transportation Needs: Not on file   Physical Activity: Not on file  Stress: Not on file  Social Connections: Not on file  Intimate Partner Violence: Not on file     BP 138/64   Pulse 69   Ht 5\' 2"  (1.575 m)   Wt 165 lb 6.4 oz (75 kg)   SpO2 98%   BMI 30.25 kg/m   Physical Exam:  Well appearing NAD HEENT: Unremarkable Neck:  No JVD, no thyromegally Lymphatics:  No adenopathy Back:  No CVA tenderness Lungs:  Clear with no wheezes HEART:  Regular rate rhythm, no murmurs, no rubs, no clicks Abd:  soft, positive bowel sounds, no organomegally, no rebound, no guarding Ext:  2 plus pulses, no edema, no cyanosis, no clubbing Skin:  No rashes no nodules Neuro:  CN II through XII intact, motor grossly intact   Assess/Plan:  1. Atrial fib - her symptoms are much improved on flecainide. She will continue her current meds at 50 bid. 2. HTN - her bp is slightly up. She is encouraged to avoid salty foods. 3. GI bleeding - she was prescribed octreotide and she has not had more bleeding and is on eliquis. She has not been thought to be a good candidate for anti-coagulation.    Carleene Overlie Adelena Desantiago,MD

## 2021-06-13 NOTE — Patient Instructions (Signed)
Medication Instructions:  Your physician recommends that you continue on your current medications as directed. Please refer to the Current Medication list given to you today.  *If you need a refill on your cardiac medications before your next appointment, please call your pharmacy*   Lab Work: NONE   If you have labs (blood work) drawn today and your tests are completely normal, you will receive your results only by: MyChart Message (if you have MyChart) OR A paper copy in the mail If you have any lab test that is abnormal or we need to change your treatment, we will call you to review the results.   Testing/Procedures: NONE    Follow-Up: At [facility], you and your health needs are our priority.  As part of our continuing mission to provide you with exceptional heart care, we have created designated Provider Care Teams.  These Care Teams include your primary Cardiologist (physician) and Advanced Practice Providers (APPs -  Physician Assistants and Nurse Practitioners) who all work together to provide you with the care you need, when you need it.  We recommend signing up for the patient portal called "MyChart".  Sign up information is provided on this After Visit Summary.  MyChart is used to connect with patients for Virtual Visits (Telemedicine).  Patients are able to view lab/test results, encounter notes, upcoming appointments, etc.  Non-urgent messages can be sent to your provider as well.   To learn more about what you can do with MyChart, go to [email or web address].    Your next appointment:   6 month(s)  The format for your next appointment:   In Person  Provider:   Gregg Taylor, MD   Other Instructions Thank you for choosing [facility]!    

## 2021-06-19 ENCOUNTER — Other Ambulatory Visit: Payer: Self-pay | Admitting: Gastroenterology

## 2021-06-22 ENCOUNTER — Ambulatory Visit: Payer: Medicare HMO | Admitting: Internal Medicine

## 2021-06-27 ENCOUNTER — Encounter (HOSPITAL_COMMUNITY)
Admission: RE | Admit: 2021-06-27 | Discharge: 2021-06-27 | Disposition: A | Discharge status: Home / Self Care | Payer: Medicare HMO | Source: Ambulatory Visit | Attending: Nephrology | Admitting: Nephrology

## 2021-06-27 ENCOUNTER — Other Ambulatory Visit: Payer: Self-pay

## 2021-06-27 DIAGNOSIS — N185 Chronic kidney disease, stage 5: Secondary | ICD-10-CM | POA: Insufficient documentation

## 2021-06-27 DIAGNOSIS — D631 Anemia in chronic kidney disease: Secondary | ICD-10-CM | POA: Insufficient documentation

## 2021-06-27 LAB — CBC WITH DIFFERENTIAL/PLATELET
Abs Immature Granulocytes: 0.01 10*3/uL (ref 0.00–0.07)
Basophils Absolute: 0 10*3/uL (ref 0.0–0.1)
Basophils Relative: 1 %
Eosinophils Absolute: 0.4 10*3/uL (ref 0.0–0.5)
Eosinophils Relative: 8 %
HCT: 29.8 % — ABNORMAL LOW (ref 36.0–46.0)
Hemoglobin: 8.9 g/dL — ABNORMAL LOW (ref 12.0–15.0)
Immature Granulocytes: 0 %
Lymphocytes Relative: 41 %
Lymphs Abs: 2.1 10*3/uL (ref 0.7–4.0)
MCH: 29.3 pg (ref 26.0–34.0)
MCHC: 29.9 g/dL — ABNORMAL LOW (ref 30.0–36.0)
MCV: 98 fL (ref 80.0–100.0)
Monocytes Absolute: 0.4 10*3/uL (ref 0.1–1.0)
Monocytes Relative: 9 %
Neutro Abs: 2.1 10*3/uL (ref 1.7–7.7)
Neutrophils Relative %: 41 %
Platelets: 286 10*3/uL (ref 150–400)
RBC: 3.04 MIL/uL — ABNORMAL LOW (ref 3.87–5.11)
RDW: 13.2 % (ref 11.5–15.5)
WBC: 5.1 10*3/uL (ref 4.0–10.5)
nRBC: 0 % (ref 0.0–0.2)

## 2021-06-27 LAB — RENAL FUNCTION PANEL
Albumin: 4 g/dL (ref 3.5–5.0)
Anion gap: 10 (ref 5–15)
BUN: 40 mg/dL — ABNORMAL HIGH (ref 8–23)
CO2: 29 mmol/L (ref 22–32)
Calcium: 9.1 mg/dL (ref 8.9–10.3)
Chloride: 99 mmol/L (ref 98–111)
Creatinine, Ser: 1.99 mg/dL — ABNORMAL HIGH (ref 0.44–1.00)
GFR, Estimated: 26 mL/min — ABNORMAL LOW (ref >60)
Glucose, Bld: 112 mg/dL — ABNORMAL HIGH (ref 70–99)
Phosphorus: 4.1 mg/dL (ref 2.5–4.6)
Potassium: 4.6 mmol/L (ref 3.5–5.1)
Sodium: 138 mmol/L (ref 135–145)

## 2021-06-27 LAB — IRON AND TIBC
Iron: 33 ug/dL (ref 28–170)
Saturation Ratios: 8 % — ABNORMAL LOW (ref 10.4–31.8)
TIBC: 416 ug/dL (ref 250–450)
UIBC: 383 ug/dL

## 2021-06-27 LAB — PROTEIN / CREATININE RATIO, URINE
Creatinine, Urine: 40.87 mg/dL
Protein Creatinine Ratio: 0.17 mg/mg{Cre} — ABNORMAL HIGH (ref 0.00–0.15)
Total Protein, Urine: 7 mg/dL

## 2021-06-27 MED ORDER — EPOETIN ALFA-EPBX 3000 UNIT/ML IJ SOLN
INTRAMUSCULAR | Status: AC
  Administered 2021-06-27: 3000 [IU] via SUBCUTANEOUS
  Filled 2021-06-27: qty 1

## 2021-06-28 ENCOUNTER — Ambulatory Visit: Payer: Medicare HMO | Admitting: Internal Medicine

## 2021-06-28 ENCOUNTER — Encounter: Payer: Self-pay | Admitting: Internal Medicine

## 2021-06-28 VITALS — BP 124/59 | HR 64 | Temp 97.7°F | Ht 62.0 in | Wt 166.6 lb

## 2021-06-28 DIAGNOSIS — K5900 Constipation, unspecified: Secondary | ICD-10-CM

## 2021-06-28 DIAGNOSIS — K5521 Angiodysplasia of colon with hemorrhage: Secondary | ICD-10-CM | POA: Diagnosis not present

## 2021-06-28 DIAGNOSIS — K219 Gastro-esophageal reflux disease without esophagitis: Secondary | ICD-10-CM

## 2021-06-28 DIAGNOSIS — D500 Iron deficiency anemia secondary to blood loss (chronic): Secondary | ICD-10-CM

## 2021-06-28 LAB — PTH, INTACT AND CALCIUM
Calcium, Total (PTH): 9.2 mg/dL (ref 8.7–10.3)
PTH: 60 pg/mL (ref 15–65)

## 2021-06-28 LAB — POCT HEMOGLOBIN-HEMACUE: Hemoglobin: 9.7 g/dL — ABNORMAL LOW (ref 12.0–15.0)

## 2021-06-28 NOTE — Progress Notes (Signed)
Referring Provider: Andres Shad, * Primary Care Physician:  Andres Shad, MD Primary GI:  Dr. Abbey Chatters  Chief Complaint  Patient presents with   Anemia    Had double balloon enteroscopy at [facility] 05/19/21   Constipation    Insurance will not cover Amitiza. Clearlax doesn't work well    HPI:   [name] [name] is a 75 y.o. female who presents to the clinic today for follow-up visit.  She has had 2 hospitalizations acute blood loss anemia.  Hospitalization in early December 2021, she underwent EGD which was relatively unremarkable.  Subsequent small bowel capsule endoscopy revealed multiple small bowel AVMs.  She subsequently underwent enteroscopy with APC of multiple AVM lesions on 11/27/2020.     She was discharged from the facility and did well until she suffered a syncopal episode at home.  She reported back to the ER 12/05/2020 and was found to be anemic again with hemoglobin of 7.  She was started on subcutaneous octreotide during that hospitalization.  She did relatively well on this medication until April 2022 when she again had worsening anemia.  She presented to the ER for transfusion.  She was referred to [facility] and underwent double-balloon enteroscopy on 05/19/2021 by Dr. Durene Fruits.  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  Since that time she has done well.  Most recent hemoglobin 9.7 on 06/27/2021.   Of note, patient is on Eliquis for afib.   Also has chronic reflux which is well controlled on pantoprazole 40 mg twice daily.  Today she states she is doing well.  Denies any rectal bleeding or melena.  Does have chronic constipation which was well controlled on Amitiza 24 mg twice daily though her insurance just notified her that they will not cover this medication without prior authorization.  She is currently taking MiraLAX which is not adequate.   Previously tried Linzess which also did not work.  Past Medical History:  Diagnosis Date   Anemia    Chronic kidney disease    Constipation 07/17/2017   GERD (gastroesophageal reflux disease)    Gout    Grade II diastolic dysfunction 67/07/9380   Hyperlipidemia    Hypertension    Hypokalemia    OSA (obstructive sleep apnea) 08/09/2020   PAF (paroxysmal atrial fibrillation) (HCC)    PAF (paroxysmal atrial fibrillation) (HCC)    PUD (peptic ulcer disease)    Syncope    Neurocardiogenic    Past Surgical History:  Procedure Laterality Date   ABDOMINAL AORTOGRAM W/LOWER EXTREMITY Right 12/13/2020   Procedure: ABDOMINAL AORTOGRAM W/LOWER EXTREMITY;  Surgeon: Serafina Mitchell, MD;  Location: [facility];  Service: Cardiovascular;  Laterality: Right;   ABDOMINAL HYSTERECTOMY     AMPUTATION Right 12/14/2020   Procedure: Right fifth toe amputation;  Surgeon: Rosetta Posner, MD;  Location: [facility] OR;  Service: Vascular;  Laterality: Right;   CATARACT EXTRACTION Bilateral    COLONOSCOPY N/A 10/07/2014   Dr. Oneida Alar: redundant left colon, moderate sized external hemorrhoids    COLONOSCOPY N/A 03/11/2020   Surgeon: Danie Binder, MD;  9 polyps, majority of which were tubular adenomas, nodular mucosa in the anus s/p biopsy which was benign.   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  ESOPHAGOGASTRODUODENOSCOPY (EGD) WITH PROPOFOL N/A 11/25/2020    Surgeon: Daneil Dolin, MD; Normal, s/p capsule deployment   [place] N/A 11/25/2020    Surgeon: Daneil Dolin, MD; 4/nonbleeding AVMs in the proximal small bowel large fresh blood, 2 bleeding lymphangiectasis in SB.    HOT HEMOSTASIS  11/27/2020   Procedure: HOT HEMOSTASIS (ARGON PLASMA COAGULATION/BICAP);  Surgeon: Montez Morita, Quillian Quince, MD;  [facility];  Service:  Gastroenterology;;   LIPOMA RESECTION     PERIPHERAL VASCULAR INTERVENTION Right 12/13/2020   Procedure: PERIPHERAL VASCULAR INTERVENTION;  Surgeon: Serafina Mitchell, MD;  Location: [facility];  Service: Cardiovascular;  Laterality: Right;  SFA/PT/AT   POLYPECTOMY  03/11/2020   Procedure: POLYPECTOMY;  Surgeon: Danie Binder, MD;  [facility];  Service: Endoscopy;;  cecal, transverse, descending, sigmoid   TEE WITHOUT CARDIOVERSION N/A 05/10/2021   Procedure: TRANSESOPHAGEAL ECHOCARDIOGRAM (TEE) WATCHMEN EVALUATION;  Surgeon: Josue Hector, MD;  Location: [facility];  Service: Cardiovascular;  Laterality: N/A;    Current Outpatient Medications  Medication Sig Dispense Refill   acetaminophen (TYLENOL) 500 MG tablet Take 1,000 mg by mouth every 6 (six) hours as needed for moderate pain.     albuterol (VENTOLIN HFA) 108 (90 Base) MCG/ACT inhaler Inhale 1-2 puffs into the lungs every 6 (six) hours as needed for wheezing or shortness of breath.      apixaban (ELIQUIS) 5 MG TABS tablet TAKE 1 TABLET BY MOUTH TWICE A DAY 180 tablet 1   cetirizine (ZYRTEC) 10 MG tablet Take 10 mg by mouth daily as needed for allergies.     Cholecalciferol (VITAMIN D-3) 25 MCG (1000 UT) CAPS Take 1,000 Units by mouth daily.     colchicine 0.6 MG tablet Take 1 tablet by mouth daily as needed (gout flare).     cyclobenzaprine (FLEXERIL) 5 MG tablet Take 5 mg by mouth 3 (three) times daily as needed for muscle spasms.     diclofenac Sodium (VOLTAREN) 1 % GEL Apply 1 application topically 4 (four) times daily as needed (pain).     ELDERBERRY PO Take 2 capsules by mouth daily.      epoetin alfa-epbx (RETACRIT) 3000 UNIT/ML injection Inject 3,000 Units into the skin every 14 (fourteen) days.     feeding supplement (ENSURE ENLIVE / ENSURE PLUS) LIQD Take 237 mLs by mouth 2 (two) times daily between meals. 237 mL 12   ferrous sulfate 325 (65 FE) MG tablet Take 325 mg by mouth daily with breakfast.      flecainide (TAMBOCOR) 50 MG tablet TAKE 1 TABLET BY MOUTH TWICE A DAY 180 tablet 3   fluticasone (FLONASE) 50 MCG/ACT nasal spray Place 1 spray into both nostrils daily as needed for allergies or rhinitis.     Fluticasone-Salmeterol (ADVAIR) 500-50 MCG/DOSE AEPB Inhale 1 puff into the lungs every 12 (twelve) hours.     furosemide (LASIX) 40 MG tablet TAKE 1 TABLET BY MOUTH EVERY DAY 90 tablet 1   gabapentin (NEURONTIN) 300 MG capsule Take 300 mg by mouth 3 (three) times daily as needed (nerve pain).     hydrocortisone (ANUSOL-HC) 2.5 % rectal cream Place 1 application rectally 2 (two) times daily. 30 g 1   losartan (COZAAR) 25 MG tablet Take 25 mg by mouth daily.     octreotide (SANDOSTATIN) 100 MCG/ML SOLN injection Inject 1 mL (100 mcg total) into the skin every 12 (twelve) hours. 60 mL 1   pantoprazole (PROTONIX) 40 MG tablet  TAKE 1 TABLET (40 MG TOTAL) BY MOUTH 2 (TWO) TIMES DAILY BEFORE A MEAL. 60 tablet 1   Polyethylene Glycol 3350 (CLEARLAX PO) Take by mouth as needed. 17 grams     potassium chloride SA (KLOR-CON) 20 MEQ tablet Take 20 mEq by mouth in the morning, at noon, and at bedtime.     rosuvastatin (CRESTOR) 10 MG tablet TAKE 1 TABLET BY MOUTH EVERY DAY 30 tablet 0   vitamin C (ASCORBIC ACID) 250 MG tablet Take 500 mg by mouth daily.     lubiprostone (AMITIZA) 24 MCG capsule Take 1 capsule (24 mcg total) by mouth in the morning and at bedtime. (Patient not taking: Reported on 06/28/2021) 180 capsule 3   No current facility-administered medications for this visit.    Allergies as of 06/28/2021 - Review Complete 06/28/2021  Allergen Reaction Noted   Amlodipine  08/03/2020   Amoxicillin Hives 03/04/2020   Doxycycline  11/23/2020   Acetaminophen-codeine Hives and Rash 09/16/2020   Allopurinol Hives and Rash    Tramadol Hives and Rash     Family History  Problem Relation Age of Onset   Stroke Mother    Dementia Father    Cancer - Other Sister    Atrial fibrillation Sister     Heart failure Sister    Colon cancer Maternal Grandmother        diagnosed in her 71s    Social History   Socioeconomic History   Marital status: Single    Spouse name: Not on file   Number of children: Not on file   Years of education: Not on file   Highest education level: Not on file  Occupational History   Occupation: Retired  Tobacco Use   Smoking status: Former    Packs/day: 1.00    Years: 20.00    Pack years: 20.00    Types: Cigarettes    Start date: 03/16/1964    Quit date: 10/07/1997    Years since quitting: 23.7   Smokeless tobacco: Never  Vaping Use   Vaping Use: Never used  Substance and Sexual Activity   Alcohol use: No    Alcohol/week: 0.0 standard drinks   Drug use: No   Sexual activity: Yes    Partners: Male  Other Topics Concern   Not on file  Social History Narrative   Not on file   Social Determinants of Health   Financial Resource Strain: Not on file  Food Insecurity: Not on file  Transportation Needs: Not on file  Physical Activity: Not on file  Stress: Not on file  Social Connections: Not on file    Subjective: Review of Systems  Constitutional:  Negative for chills and fever.  HENT:  Negative for congestion and hearing loss.   Eyes:  Negative for blurred vision and double vision.  Respiratory:  Negative for cough and shortness of breath.   Cardiovascular:  Negative for chest pain and palpitations.  Gastrointestinal:  Positive for constipation. Negative for abdominal pain, blood in stool, diarrhea, heartburn, melena and vomiting.  Genitourinary:  Negative for dysuria and urgency.  Musculoskeletal:  Negative for joint pain and myalgias.  Skin:  Negative for itching and rash.  Neurological:  Negative for dizziness and headaches.  Psychiatric/Behavioral:  Negative for depression. The patient is not nervous/anxious.     Objective: BP (!) 124/59   Pulse 64   Temp 97.7 F (36.5 C) (Temporal)   Ht 5\' 2"  (1.575 m)   Wt 166 lb 9.6 oz  (  75.6 kg)   BMI 30.47 kg/m  Physical Exam Constitutional:      Appearance: Normal appearance.  HENT:     Head: Normocephalic and atraumatic.  Eyes:     Extraocular Movements: Extraocular movements intact.     Conjunctiva/sclera: Conjunctivae normal.  Cardiovascular:     Rate and Rhythm: Normal rate and regular rhythm.  Pulmonary:     Effort: Pulmonary effort is normal.     Breath sounds: Normal breath sounds.  Abdominal:     General: Bowel sounds are normal.     Palpations: Abdomen is soft.  Musculoskeletal:        General: No swelling. Normal range of motion.     Cervical back: Normal range of motion and neck supple.  Skin:    General: Skin is warm and dry.     Coloration: Skin is not jaundiced.  Neurological:     General: No focal deficit present.     Mental Status: She is alert and oriented to person, place, and time.  Psychiatric:        Mood and Affect: Mood normal.        Behavior: Behavior normal.     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   Plan: Discussed AVMs in depth with patient and her sister today.     She is status post a balloon enteroscopy 05/19/2021 at [facility] by Dr. Durene Fruits with a total of 27 AVMs ablated by APC in the duodenum and jejunum.  Most recent hemoglobin 9.7 on 06/27/2021.  Continue on subcutaneous octreotide.  We may need to refer her back for repeat double-balloon enteroscopy if further bleeding occurs.  For her constipation, I recommended that she contact her insurance company and have them fax [place] the prior authorization paperwork and we will try to get her Amitiza 24 mg twice daily reapproved as this seemed to work very well.  Reflux well-controlled on pantoprazole 40 mg twice daily.  We will continue.  Consider weaning to once daily as tolerated.  Otherwise follow-up in 3 to 4 months.   06/28/2021 8:27 AM   Disclaimer: This note  was dictated with voice recognition software. Similar sounding words can inadvertently be transcribed and may not be corrected upon review.

## 2021-06-28 NOTE — Patient Instructions (Signed)
I am happy to hear you are doing well.  Most recent CBC showed hemoglobin 9.7.  We will continue to monitor this.  Continue on subcutaneous octreotide.  We may need to consider repeat enteroscopy in the future if you have further bleeding.  For your constipation, please call your pharmacy and have them send prior authorization paperwork to [facility] gastro and we will work on prior authorization to get this medication covered.  Otherwise follow-up in 3 to 4 months.  At [facility] we value your feedback. You may receive a survey about your visit today. Please share your experience as we strive to create trusting relationships with our patients to provide genuine, compassionate, quality care.  We appreciate your understanding and patience as we review any laboratory studies, imaging, and other diagnostic tests that are ordered as we care for you. Our office policy is 5 business days for review of these results, and any emergent or urgent results are addressed in a timely manner for your best interest. If you do not hear from our office in 1 week, please contact us.   We also encourage the use of MyChart, which contains your medical information for your review as well. If you are not enrolled in this feature, an access code is on this after visit summary for your convenience. Thank you for allowing [place] to be involved in your care.  It was great to see you today!  I hope you have a great rest of your summer!!    Elon Alas. Abbey Chatters, D.O. Gastroenterology and Hepatology [facility]

## 2021-06-29 ENCOUNTER — Telehealth: Payer: Self-pay | Admitting: Internal Medicine

## 2021-06-29 NOTE — Telephone Encounter (Signed)
PATIENT CALLED AND SAID SHE WAS TOLD THAT HER INSURANCE WOULD NOT COVER HER AMITEZA.  DOES IT NEED A PRIOR AUTH?

## 2021-06-29 NOTE — Telephone Encounter (Signed)
Returned the pt's call and her phone just continuously rang. No vm left. I haven't received anything from pharmacy or cover my meds on this

## 2021-07-02 ENCOUNTER — Other Ambulatory Visit: Payer: Self-pay | Admitting: Student

## 2021-07-03 ENCOUNTER — Telehealth: Payer: Self-pay | Admitting: Student

## 2021-07-03 MED ORDER — LOSARTAN POTASSIUM 25 MG PO TABS: 25.0000 mg | ORAL_TABLET | Freq: Every day | ORAL | 2 refills | Status: DC

## 2021-07-03 NOTE — Telephone Encounter (Signed)
    1. Which medications need to be refilled? (please list name of each medication and dose if known) LOSARTINE 25 MG   2. Which pharmacy/location (including street and city if local pharmacy) is medication to [facility] sent to?[facility] [place]   3. Do they need a 30 day or 90 day supply? [place]

## 2021-07-05 NOTE — Telephone Encounter (Signed)
Phoned the pt's cell number and got through to advise her that [employer] approved her Amitiza capsule from 12/24/2020 through 12/23/2021. Dx: chronic constipation/ tried/failed : Linzess 290 mcg, Miralax, Mag Citrate, and clearlax. Will give to Manuela Schwartz to scan in the pt's chart.

## 2021-07-10 ENCOUNTER — Other Ambulatory Visit: Payer: Self-pay | Admitting: Family Medicine

## 2021-07-11 ENCOUNTER — Encounter (HOSPITAL_COMMUNITY)
Admission: RE | Admit: 2021-07-11 | Discharge: 2021-07-11 | Disposition: A | Discharge status: Home / Self Care | Payer: Medicare HMO | Source: Ambulatory Visit | Attending: Nephrology | Admitting: Nephrology

## 2021-07-11 ENCOUNTER — Other Ambulatory Visit: Payer: Self-pay

## 2021-07-11 ENCOUNTER — Other Ambulatory Visit: Payer: Self-pay | Admitting: Family Medicine

## 2021-07-11 DIAGNOSIS — N185 Chronic kidney disease, stage 5: Secondary | ICD-10-CM | POA: Diagnosis not present

## 2021-07-11 LAB — POCT HEMOGLOBIN-HEMACUE: Hemoglobin: 10.3 g/dL — ABNORMAL LOW (ref 12.0–15.0)

## 2021-07-14 ENCOUNTER — Other Ambulatory Visit: Payer: Self-pay | Admitting: Family Medicine

## 2021-07-24 ENCOUNTER — Other Ambulatory Visit: Payer: Self-pay | Admitting: Gastroenterology

## 2021-07-25 ENCOUNTER — Other Ambulatory Visit: Payer: Self-pay

## 2021-07-25 ENCOUNTER — Encounter (HOSPITAL_COMMUNITY)
Admission: RE | Admit: 2021-07-25 | Discharge: 2021-07-25 | Disposition: A | Discharge status: Home / Self Care | Payer: Medicare HMO | Source: Ambulatory Visit | Attending: Nephrology | Admitting: Nephrology

## 2021-07-25 DIAGNOSIS — N184 Chronic kidney disease, stage 4 (severe): Secondary | ICD-10-CM | POA: Insufficient documentation

## 2021-07-25 DIAGNOSIS — D509 Iron deficiency anemia, unspecified: Secondary | ICD-10-CM | POA: Insufficient documentation

## 2021-07-25 LAB — CBC WITH DIFFERENTIAL/PLATELET
Abs Immature Granulocytes: 0.01 K/uL (ref 0.00–0.07)
Basophils Absolute: 0 K/uL (ref 0.0–0.1)
Basophils Relative: 1 %
Eosinophils Absolute: 0.3 K/uL (ref 0.0–0.5)
Eosinophils Relative: 7 %
HCT: 32.8 % — ABNORMAL LOW (ref 36.0–46.0)
Hemoglobin: 9.8 g/dL — ABNORMAL LOW (ref 12.0–15.0)
Immature Granulocytes: 0 %
Lymphocytes Relative: 42 %
Lymphs Abs: 1.9 K/uL (ref 0.7–4.0)
MCH: 29.7 pg (ref 26.0–34.0)
MCHC: 29.9 g/dL — ABNORMAL LOW (ref 30.0–36.0)
MCV: 99.4 fL (ref 80.0–100.0)
Monocytes Absolute: 0.4 K/uL (ref 0.1–1.0)
Monocytes Relative: 8 %
Neutro Abs: 1.9 K/uL (ref 1.7–7.7)
Neutrophils Relative %: 42 %
Platelets: 291 K/uL (ref 150–400)
RBC: 3.3 MIL/uL — ABNORMAL LOW (ref 3.87–5.11)
RDW: 13.1 % (ref 11.5–15.5)
WBC: 4.6 K/uL (ref 4.0–10.5)
nRBC: 0 % (ref 0.0–0.2)

## 2021-07-25 LAB — RENAL FUNCTION PANEL
Albumin: 4.2 g/dL (ref 3.5–5.0)
Anion gap: 7 (ref 5–15)
BUN: 27 mg/dL — ABNORMAL HIGH (ref 8–23)
CO2: 29 mmol/L (ref 22–32)
Calcium: 9 mg/dL (ref 8.9–10.3)
Chloride: 101 mmol/L (ref 98–111)
Creatinine, Ser: 2.05 mg/dL — ABNORMAL HIGH (ref 0.44–1.00)
GFR, Estimated: 25 mL/min — ABNORMAL LOW (ref >60)
Glucose, Bld: 124 mg/dL — ABNORMAL HIGH (ref 70–99)
Phosphorus: 4.1 mg/dL (ref 2.5–4.6)
Potassium: 4.3 mmol/L (ref 3.5–5.1)
Sodium: 137 mmol/L (ref 135–145)

## 2021-07-25 LAB — POCT HEMOGLOBIN-HEMACUE: Hemoglobin: 10.4 g/dL — ABNORMAL LOW (ref 12.0–15.0)

## 2021-07-25 LAB — IRON AND TIBC
Iron: 26 ug/dL — ABNORMAL LOW (ref 28–170)
Saturation Ratios: 7 % — ABNORMAL LOW (ref 10.4–31.8)
TIBC: 397 ug/dL (ref 250–450)
UIBC: 371 ug/dL

## 2021-07-25 LAB — VITAMIN D 25 HYDROXY (VIT D DEFICIENCY, FRACTURES): Vit D, 25-Hydroxy: 35.21 ng/mL (ref 30–100)

## 2021-07-25 LAB — PROTEIN / CREATININE RATIO, URINE
Creatinine, Urine: 101.97 mg/dL
Protein Creatinine Ratio: 0.24 mg/mg{creat} — ABNORMAL HIGH (ref 0.00–0.15)
Total Protein, Urine: 24 mg/dL

## 2021-07-25 MED ORDER — EPOETIN ALFA-EPBX 3000 UNIT/ML IJ SOLN: 3000.0000 [IU] | Freq: Once | INTRAMUSCULAR | Status: DC

## 2021-07-26 LAB — PTH, INTACT AND CALCIUM
Calcium, Total (PTH): 9.4 mg/dL (ref 8.7–10.3)
PTH: 84 pg/mL — ABNORMAL HIGH (ref 15–65)

## 2021-07-27 ENCOUNTER — Telehealth: Payer: Self-pay | Admitting: *Deleted

## 2021-07-27 NOTE — Telephone Encounter (Signed)
Pre-filled syringes will be fine at the same dose as current Rx. Not sure if there is any significant cost difference for the patient.

## 2021-07-27 NOTE — Telephone Encounter (Signed)
[facility] to [facility].  Rep informed me that Octreotide (single dose file) is out of stock until late August.  Wants to know if we would like to send in RX for Octreotide pre-filled syringes instead.  Routing to [employer], PA-C to review tomorrow when she returns.

## 2021-07-28 NOTE — Telephone Encounter (Signed)
Noted  

## 2021-07-28 NOTE — Telephone Encounter (Signed)
Spoke to [employer] Warehouse manager) at [employer] and she took verbal order from me of the same dose as current RX on file.  Will dispense in prefilled syringes.

## 2021-08-08 ENCOUNTER — Encounter (HOSPITAL_COMMUNITY)
Admission: RE | Admit: 2021-08-08 | Discharge: 2021-08-08 | Disposition: A | Discharge status: Home / Self Care | Payer: Medicare HMO | Source: Ambulatory Visit | Attending: Nephrology | Admitting: Nephrology

## 2021-08-08 ENCOUNTER — Other Ambulatory Visit: Payer: Self-pay

## 2021-08-08 DIAGNOSIS — N184 Chronic kidney disease, stage 4 (severe): Secondary | ICD-10-CM | POA: Diagnosis not present

## 2021-08-08 LAB — POCT HEMOGLOBIN-HEMACUE: Hemoglobin: 11.2 g/dL — ABNORMAL LOW (ref 12.0–15.0)

## 2021-08-08 MED ORDER — EPOETIN ALFA-EPBX 3000 UNIT/ML IJ SOLN: 3000.0000 [IU] | Freq: Once | INTRAMUSCULAR | Status: DC

## 2021-08-08 MED ORDER — SODIUM CHLORIDE 0.9 % IV SOLN: INTRAVENOUS | Status: DC

## 2021-08-08 MED ORDER — SODIUM CHLORIDE 0.9 % IV SOLN
200.0000 mg | Freq: Once | INTRAVENOUS | Status: AC
  Administered 2021-08-08: 200 mg via INTRAVENOUS
  Filled 2021-08-08: qty 10

## 2021-08-09 ENCOUNTER — Encounter (HOSPITAL_COMMUNITY)
Admission: RE | Admit: 2021-08-09 | Discharge: 2021-08-09 | Disposition: A | Discharge status: Home / Self Care | Payer: Medicare HMO | Source: Ambulatory Visit | Attending: Nephrology | Admitting: Nephrology

## 2021-08-09 DIAGNOSIS — N184 Chronic kidney disease, stage 4 (severe): Secondary | ICD-10-CM | POA: Diagnosis not present

## 2021-08-09 MED ORDER — SODIUM CHLORIDE 0.9 % IV SOLN
INTRAVENOUS | Status: DC
  Administered 2021-08-09: 250 mL via INTRAVENOUS

## 2021-08-09 MED ORDER — SODIUM CHLORIDE 0.9 % IV SOLN
200.0000 mg | Freq: Once | INTRAVENOUS | Status: AC
  Administered 2021-08-09: 200 mg via INTRAVENOUS
  Filled 2021-08-09: qty 10

## 2021-08-10 ENCOUNTER — Other Ambulatory Visit: Payer: Self-pay

## 2021-08-10 ENCOUNTER — Encounter (HOSPITAL_COMMUNITY)
Admission: RE | Admit: 2021-08-10 | Discharge: 2021-08-10 | Disposition: A | Discharge status: Home / Self Care | Payer: Medicare HMO | Source: Ambulatory Visit | Attending: Nephrology | Admitting: Nephrology

## 2021-08-10 DIAGNOSIS — N184 Chronic kidney disease, stage 4 (severe): Secondary | ICD-10-CM | POA: Diagnosis not present

## 2021-08-10 MED ORDER — SODIUM CHLORIDE 0.9 % IV SOLN
200.0000 mg | Freq: Once | INTRAVENOUS | Status: AC
  Administered 2021-08-10: 200 mg via INTRAVENOUS
  Filled 2021-08-10: qty 10

## 2021-08-10 MED ORDER — SODIUM CHLORIDE 0.9 % IV SOLN: INTRAVENOUS | Status: DC

## 2021-08-15 ENCOUNTER — Encounter (HOSPITAL_COMMUNITY)
Admission: RE | Admit: 2021-08-15 | Discharge: 2021-08-15 | Disposition: A | Discharge status: Home / Self Care | Payer: Medicare HMO | Source: Ambulatory Visit | Attending: Nephrology | Admitting: Nephrology

## 2021-08-15 ENCOUNTER — Encounter (HOSPITAL_COMMUNITY): Payer: Self-pay

## 2021-08-15 ENCOUNTER — Other Ambulatory Visit: Payer: Self-pay

## 2021-08-15 DIAGNOSIS — N184 Chronic kidney disease, stage 4 (severe): Secondary | ICD-10-CM | POA: Diagnosis not present

## 2021-08-15 MED ORDER — SODIUM CHLORIDE 0.9 % IV SOLN: INTRAVENOUS | Status: DC

## 2021-08-15 MED ORDER — SODIUM CHLORIDE 0.9 % IV SOLN
200.0000 mg | Freq: Once | INTRAVENOUS | Status: AC
  Administered 2021-08-15: 200 mg via INTRAVENOUS
  Filled 2021-08-15: qty 10

## 2021-08-16 ENCOUNTER — Encounter (HOSPITAL_COMMUNITY): Payer: Self-pay

## 2021-08-16 ENCOUNTER — Encounter (HOSPITAL_COMMUNITY)
Admission: RE | Admit: 2021-08-16 | Discharge: 2021-08-16 | Disposition: A | Discharge status: Home / Self Care | Payer: Medicare HMO | Source: Ambulatory Visit | Attending: Nephrology | Admitting: Nephrology

## 2021-08-16 DIAGNOSIS — N184 Chronic kidney disease, stage 4 (severe): Secondary | ICD-10-CM | POA: Diagnosis not present

## 2021-08-16 MED ORDER — SODIUM CHLORIDE 0.9 % IV SOLN: INTRAVENOUS | Status: DC

## 2021-08-16 MED ORDER — SODIUM CHLORIDE 0.9 % IV SOLN
200.0000 mg | Freq: Once | INTRAVENOUS | Status: AC
  Administered 2021-08-16: 200 mg via INTRAVENOUS
  Filled 2021-08-16: qty 10

## 2021-08-19 ENCOUNTER — Other Ambulatory Visit: Payer: Self-pay | Admitting: Family Medicine

## 2021-08-22 ENCOUNTER — Encounter (HOSPITAL_COMMUNITY)
Admission: RE | Admit: 2021-08-22 | Discharge: 2021-08-22 | Disposition: A | Discharge status: Home / Self Care | Payer: Medicare HMO | Source: Ambulatory Visit | Attending: Nephrology | Admitting: Nephrology

## 2021-08-22 ENCOUNTER — Other Ambulatory Visit: Payer: Self-pay

## 2021-08-22 DIAGNOSIS — N184 Chronic kidney disease, stage 4 (severe): Secondary | ICD-10-CM | POA: Diagnosis not present

## 2021-08-22 LAB — POCT HEMOGLOBIN-HEMACUE: Hemoglobin: 11.1 g/dL — ABNORMAL LOW (ref 12.0–15.0)

## 2021-08-22 MED ORDER — EPOETIN ALFA-EPBX 3000 UNIT/ML IJ SOLN: 3000.0000 [IU] | Freq: Once | INTRAMUSCULAR | Status: DC

## 2021-08-28 ENCOUNTER — Other Ambulatory Visit: Payer: Self-pay | Admitting: Internal Medicine

## 2021-08-30 NOTE — Telephone Encounter (Signed)
This is a  pt.  °

## 2021-09-04 ENCOUNTER — Other Ambulatory Visit: Payer: Self-pay | Admitting: Gastroenterology

## 2021-09-04 ENCOUNTER — Encounter: Payer: Self-pay | Admitting: Internal Medicine

## 2021-09-05 ENCOUNTER — Encounter (HOSPITAL_COMMUNITY)
Admission: RE | Admit: 2021-09-05 | Discharge: 2021-09-05 | Disposition: A | Discharge status: Home / Self Care | Payer: Medicare HMO | Source: Ambulatory Visit | Attending: Nephrology | Admitting: Nephrology

## 2021-09-05 ENCOUNTER — Other Ambulatory Visit: Payer: Self-pay

## 2021-09-05 DIAGNOSIS — D631 Anemia in chronic kidney disease: Secondary | ICD-10-CM | POA: Insufficient documentation

## 2021-09-05 DIAGNOSIS — N185 Chronic kidney disease, stage 5: Secondary | ICD-10-CM | POA: Diagnosis present

## 2021-09-06 LAB — POCT HEMOGLOBIN-HEMACUE: Hemoglobin: 10.7 g/dL — ABNORMAL LOW (ref 12.0–15.0)

## 2021-09-12 ENCOUNTER — Other Ambulatory Visit: Payer: Self-pay | Admitting: Student

## 2021-09-19 ENCOUNTER — Other Ambulatory Visit: Payer: Self-pay

## 2021-09-19 ENCOUNTER — Encounter (HOSPITAL_COMMUNITY)
Admission: RE | Admit: 2021-09-19 | Discharge: 2021-09-19 | Disposition: A | Discharge status: Home / Self Care | Payer: Medicare HMO | Source: Ambulatory Visit | Attending: Nephrology | Admitting: Nephrology

## 2021-09-19 DIAGNOSIS — N185 Chronic kidney disease, stage 5: Secondary | ICD-10-CM | POA: Diagnosis not present

## 2021-09-19 LAB — POCT HEMOGLOBIN-HEMACUE: Hemoglobin: 10.2 g/dL — ABNORMAL LOW (ref 12.0–15.0)

## 2021-09-19 MED ORDER — EPOETIN ALFA-EPBX 3000 UNIT/ML IJ SOLN: 3000.0000 [IU] | Freq: Once | INTRAMUSCULAR | Status: DC

## 2021-09-20 ENCOUNTER — Telehealth: Payer: Self-pay | Admitting: Internal Medicine

## 2021-09-20 NOTE — Telephone Encounter (Signed)
Magda Paganini from [employer] phoned because the pt's Octreotide Acetate is on back order from the distributor. She states the pt needs an alternate medication phoned in for the pt. She states it will be best if you call [phone] and be transferred to the pharmacist to see what they have in stock and to speak with them directly. (This Rx was scanned in on 09/07/21)

## 2021-09-20 NOTE — Telephone Encounter (Signed)
[facility] told her that she needed an alternate for her medication because they were out of stock. She doesn't remember the name of medicine. [phone]

## 2021-09-21 ENCOUNTER — Other Ambulatory Visit: Payer: Self-pay | Admitting: Gastroenterology

## 2021-09-21 DIAGNOSIS — K5521 Angiodysplasia of colon with hemorrhage: Secondary | ICD-10-CM

## 2021-09-21 DIAGNOSIS — D500 Iron deficiency anemia secondary to blood loss (chronic): Secondary | ICD-10-CM

## 2021-09-21 MED ORDER — OCTREOTIDE ACETATE 50 MCG/ML IJ SOLN: 100.0000 ug | Freq: Two times a day (BID) | INTRAMUSCULAR | 2 refills | Status: DC

## 2021-09-21 NOTE — Telephone Encounter (Signed)
[facility]  They do have octreotide 50 mcg per 1 mL in stock.  They took a verbal order for octreotide 50 mcg per 1 mL, inject 100 mcg (2 mL total) under the skin twice daily.  Patient was updated.

## 2021-09-28 ENCOUNTER — Telehealth: Payer: Self-pay | Admitting: Internal Medicine

## 2021-09-28 ENCOUNTER — Telehealth: Payer: Self-pay

## 2021-09-28 MED ORDER — APIXABAN 5 MG PO TABS: 5.0000 mg | ORAL_TABLET | Freq: Two times a day (BID) | ORAL | 1 refills | Status: DC

## 2021-09-28 NOTE — Telephone Encounter (Signed)
Forward to Edrick Oh, RN, Coumadin nurse

## 2021-09-28 NOTE — Telephone Encounter (Signed)
Barnabas Lister at [facility] phoned to advise that this pt's Octreotide (Sandostatin) 50 mcg/ml solution is out of stock. They will need a different Rx for this pt. Barnabas Lister can be reached at [phone] or fax him Rx @ [phone]. Please advise

## 2021-09-28 NOTE — Addendum Note (Signed)
Addended by: Malen Gauze on: 09/28/2021 09:58 AM   Modules accepted: Orders

## 2021-09-28 NOTE — Telephone Encounter (Signed)
*  STAT* If patient is at the pharmacy, call can be transferred to refill team.   1. Which medications need to be refilled? (please list name of each medication and dose if known)   apixaban (ELIQUIS) 5 MG TABS tablet [[id number]]   2. Which pharmacy/location (including street and city if local pharmacy) is medication to be sent to?   [id number] [address] [place].  3. Do they need a 30 day or 90 day supply?  [place]

## 2021-09-28 NOTE — Telephone Encounter (Signed)
Note sent to Dr. Abbey Chatters regarding this pt's Octreotide from [place]

## 2021-09-28 NOTE — Telephone Encounter (Signed)
Pt has a question about her medication. [phone]

## 2021-09-28 NOTE — Telephone Encounter (Signed)
error 

## 2021-09-28 NOTE — Telephone Encounter (Signed)
Prescription refill request for Eliquis received. Indication: PAF Last office visit: 06/13/21 Beckie Salts MD Scr: 2.05 on 07/25/21 Age: 75 Weight: 75kg  Based on above findings Eliquis 5mg  twice daily is the appropriate dose.  Refill approved.

## 2021-10-02 ENCOUNTER — Telehealth: Payer: Self-pay

## 2021-10-02 NOTE — Telephone Encounter (Signed)
[facility]oned again today (had already called this morning). Wanting clarification on this [t's script. All they had in stock was the 50 mcg of Octreotide. The pt is to take 100 mcg every 12 hours. They were advised to do 2 syringes of the 50 mcg to get the pt's dose. Spoke with the pharmacist [place].

## 2021-10-03 ENCOUNTER — Encounter (HOSPITAL_COMMUNITY)
Admission: RE | Admit: 2021-10-03 | Discharge: 2021-10-03 | Disposition: A | Discharge status: Home / Self Care | Payer: Medicare HMO | Source: Ambulatory Visit | Attending: Nephrology | Admitting: Nephrology

## 2021-10-03 DIAGNOSIS — N185 Chronic kidney disease, stage 5: Secondary | ICD-10-CM | POA: Insufficient documentation

## 2021-10-03 LAB — IRON AND TIBC
Iron: 110 ug/dL (ref 28–170)
Saturation Ratios: 28 % (ref 10.4–31.8)
TIBC: 391 ug/dL (ref 250–450)
UIBC: 281 ug/dL

## 2021-10-03 LAB — RENAL FUNCTION PANEL
Albumin: 4.3 g/dL (ref 3.5–5.0)
Anion gap: 7 (ref 5–15)
BUN: 25 mg/dL — ABNORMAL HIGH (ref 8–23)
CO2: 29 mmol/L (ref 22–32)
Calcium: 8.7 mg/dL — ABNORMAL LOW (ref 8.9–10.3)
Chloride: 99 mmol/L (ref 98–111)
Creatinine, Ser: 1.95 mg/dL — ABNORMAL HIGH (ref 0.44–1.00)
GFR, Estimated: 26 mL/min — ABNORMAL LOW (ref >60)
Glucose, Bld: 146 mg/dL — ABNORMAL HIGH (ref 70–99)
Phosphorus: 3.3 mg/dL (ref 2.5–4.6)
Potassium: 3.7 mmol/L (ref 3.5–5.1)
Sodium: 135 mmol/L (ref 135–145)

## 2021-10-03 LAB — CBC WITH DIFFERENTIAL/PLATELET
Abs Immature Granulocytes: 0.01 10*3/uL (ref 0.00–0.07)
Basophils Absolute: 0 10*3/uL (ref 0.0–0.1)
Basophils Relative: 1 %
Eosinophils Absolute: 0.3 10*3/uL (ref 0.0–0.5)
Eosinophils Relative: 7 %
HCT: 40.4 % (ref 36.0–46.0)
Hemoglobin: 12.1 g/dL (ref 12.0–15.0)
Immature Granulocytes: 0 %
Lymphocytes Relative: 42 %
Lymphs Abs: 1.7 10*3/uL (ref 0.7–4.0)
MCH: 28.5 pg (ref 26.0–34.0)
MCHC: 30 g/dL (ref 30.0–36.0)
MCV: 95.3 fL (ref 80.0–100.0)
Monocytes Absolute: 0.2 10*3/uL (ref 0.1–1.0)
Monocytes Relative: 6 %
Neutro Abs: 1.8 10*3/uL (ref 1.7–7.7)
Neutrophils Relative %: 44 %
Platelets: 280 10*3/uL (ref 150–400)
RBC: 4.24 MIL/uL (ref 3.87–5.11)
RDW: 13.2 % (ref 11.5–15.5)
WBC: 4.1 10*3/uL (ref 4.0–10.5)
nRBC: 0 % (ref 0.0–0.2)

## 2021-10-03 LAB — POCT HEMOGLOBIN-HEMACUE: Hemoglobin: 11.8 g/dL — ABNORMAL LOW (ref 12.0–15.0)

## 2021-10-03 LAB — PROTEIN / CREATININE RATIO, URINE
Creatinine, Urine: 60.98 mg/dL
Protein Creatinine Ratio: 0.3 mg/mg{Cre} — ABNORMAL HIGH (ref 0.00–0.15)
Total Protein, Urine: 18 mg/dL

## 2021-10-03 MED ORDER — EPOETIN ALFA-EPBX 3000 UNIT/ML IJ SOLN: 3000.0000 [IU] | Freq: Once | INTRAMUSCULAR | Status: DC

## 2021-10-04 LAB — PTH, INTACT AND CALCIUM
Calcium, Total (PTH): 9.3 mg/dL (ref 8.7–10.3)
PTH: 110 pg/mL — ABNORMAL HIGH (ref 15–65)

## 2021-10-17 ENCOUNTER — Encounter (HOSPITAL_COMMUNITY)
Admission: RE | Admit: 2021-10-17 | Discharge: 2021-10-17 | Disposition: A | Discharge status: Home / Self Care | Payer: Medicare HMO | Source: Ambulatory Visit | Attending: Nephrology | Admitting: Nephrology

## 2021-10-17 DIAGNOSIS — N185 Chronic kidney disease, stage 5: Secondary | ICD-10-CM | POA: Diagnosis not present

## 2021-10-17 LAB — POCT HEMOGLOBIN-HEMACUE: Hemoglobin: 10.7 g/dL — ABNORMAL LOW (ref 12.0–15.0)

## 2021-10-17 MED ORDER — EPOETIN ALFA-EPBX 3000 UNIT/ML IJ SOLN: 3000.0000 [IU] | Freq: Once | INTRAMUSCULAR | Status: DC

## 2021-10-18 ENCOUNTER — Telehealth: Payer: Self-pay | Admitting: *Deleted

## 2021-10-18 ENCOUNTER — Other Ambulatory Visit: Payer: Self-pay | Admitting: Family Medicine

## 2021-10-18 NOTE — Telephone Encounter (Signed)
Refill sent for pt under Tula Nakayama MD pharmacy called to delete prescription and do not fill Spoke with pharmacy and the prescription was deleted as Tula Nakayama MD is not pcp

## 2021-10-31 ENCOUNTER — Encounter (HOSPITAL_COMMUNITY)
Admission: RE | Admit: 2021-10-31 | Discharge: 2021-10-31 | Disposition: A | Discharge status: Home / Self Care | Payer: Medicare HMO | Source: Ambulatory Visit | Attending: Nephrology | Admitting: Nephrology

## 2021-10-31 VITALS — BP 193/67 | HR 79 | Temp 98.2°F | Resp 18

## 2021-10-31 DIAGNOSIS — N289 Disorder of kidney and ureter, unspecified: Secondary | ICD-10-CM

## 2021-10-31 DIAGNOSIS — N185 Chronic kidney disease, stage 5: Secondary | ICD-10-CM | POA: Diagnosis not present

## 2021-10-31 LAB — IRON AND TIBC
Iron: 102 ug/dL (ref 28–170)
Saturation Ratios: 27 % (ref 10.4–31.8)
TIBC: 383 ug/dL (ref 250–450)
UIBC: 281 ug/dL

## 2021-10-31 LAB — RENAL FUNCTION PANEL
Albumin: 4.5 g/dL (ref 3.5–5.0)
Anion gap: 7 (ref 5–15)
BUN: 22 mg/dL (ref 8–23)
CO2: 30 mmol/L (ref 22–32)
Calcium: 9.2 mg/dL (ref 8.9–10.3)
Chloride: 100 mmol/L (ref 98–111)
Creatinine, Ser: 1.79 mg/dL — ABNORMAL HIGH (ref 0.44–1.00)
GFR, Estimated: 29 mL/min — ABNORMAL LOW (ref >60)
Glucose, Bld: 109 mg/dL — ABNORMAL HIGH (ref 70–99)
Phosphorus: 3.1 mg/dL (ref 2.5–4.6)
Potassium: 3.7 mmol/L (ref 3.5–5.1)
Sodium: 137 mmol/L (ref 135–145)

## 2021-10-31 LAB — CBC
HCT: 35.1 % — ABNORMAL LOW (ref 36.0–46.0)
Hemoglobin: 10.5 g/dL — ABNORMAL LOW (ref 12.0–15.0)
MCH: 28.5 pg (ref 26.0–34.0)
MCHC: 29.9 g/dL — ABNORMAL LOW (ref 30.0–36.0)
MCV: 95.4 fL (ref 80.0–100.0)
Platelets: 285 10*3/uL (ref 150–400)
RBC: 3.68 MIL/uL — ABNORMAL LOW (ref 3.87–5.11)
RDW: 13.2 % (ref 11.5–15.5)
WBC: 4.9 10*3/uL (ref 4.0–10.5)
nRBC: 0 % (ref 0.0–0.2)

## 2021-10-31 LAB — PROTEIN / CREATININE RATIO, URINE
Creatinine, Urine: 69 mg/dL
Protein Creatinine Ratio: 0.16 mg/mg{Cre} — ABNORMAL HIGH (ref 0.00–0.15)
Total Protein, Urine: 11 mg/dL

## 2021-10-31 LAB — POCT HEMOGLOBIN-HEMACUE: Hemoglobin: 10.5 g/dL — ABNORMAL LOW (ref 12.0–15.0)

## 2021-10-31 MED ORDER — EPOETIN ALFA-EPBX 3000 UNIT/ML IJ SOLN: 3000.0000 [IU] | Freq: Once | INTRAMUSCULAR | Status: DC

## 2021-11-01 ENCOUNTER — Ambulatory Visit: Payer: Medicare HMO | Admitting: Internal Medicine

## 2021-11-01 ENCOUNTER — Telehealth: Payer: Self-pay | Admitting: Cardiology

## 2021-11-01 DIAGNOSIS — Z79899 Other long term (current) drug therapy: Secondary | ICD-10-CM

## 2021-11-01 LAB — PTH, INTACT AND CALCIUM
Calcium, Total (PTH): 9.4 mg/dL (ref 8.7–10.3)
PTH: 80 pg/mL — ABNORMAL HIGH (ref 15–65)

## 2021-11-01 NOTE — Telephone Encounter (Signed)
Spoke to pt who stated that each time she has her BP checked, the top number runs between 170-190. Pt stated that she does not have a bp log. Pt stated that she does not drink caffeine and that she does not add salt to her food. Pt would like her losartan increased. Pt also verbalized that her heart rate has stayed within a normal range. Please advise.

## 2021-11-01 NOTE — Telephone Encounter (Signed)
Pt c/o medication issue:  1. Name of Medication: losartan (COZAAR) 25 MG tablet  2. How are you currently taking this medication (dosage and times per day)? TAKE 1 TABLET BY MOUTH EVERY DAY  3. Are you having a reaction (difficulty breathing--STAT)? no  4. What is your medication issue? Patient want to the meds to increase because her bp is high. She went yestrday to get bp check an it was 193/70.

## 2021-11-07 NOTE — Telephone Encounter (Signed)
Increase losartan to 50 mg daily. Recheck bmp in 2 weeks.

## 2021-11-08 MED ORDER — LOSARTAN POTASSIUM 50 MG PO TABS: 50.0000 mg | ORAL_TABLET | Freq: Every day | ORAL | 3 refills | Status: DC

## 2021-11-08 NOTE — Telephone Encounter (Signed)
Returned call to pt, no able to leave message- no voicemail.   Medication changes made reflected on medication list.  BMP lab ordered per Dr. Lovena Le.

## 2021-11-09 NOTE — Telephone Encounter (Signed)
pt returning call

## 2021-11-10 NOTE — Telephone Encounter (Signed)
Pt notified of medication changes and lab work. Pt agreeable to both.

## 2021-11-14 ENCOUNTER — Encounter (HOSPITAL_COMMUNITY)
Admission: RE | Admit: 2021-11-14 | Discharge: 2021-11-14 | Disposition: A | Discharge status: Home / Self Care | Payer: Medicare HMO | Source: Ambulatory Visit | Attending: Nephrology | Admitting: Nephrology

## 2021-11-14 DIAGNOSIS — N185 Chronic kidney disease, stage 5: Secondary | ICD-10-CM | POA: Diagnosis not present

## 2021-11-14 LAB — POCT HEMOGLOBIN-HEMACUE: Hemoglobin: 10 g/dL — ABNORMAL LOW (ref 12.0–15.0)

## 2021-11-14 MED ORDER — EPOETIN ALFA-EPBX 3000 UNIT/ML IJ SOLN: 3000.0000 [IU] | Freq: Once | INTRAMUSCULAR | Status: DC

## 2021-11-28 ENCOUNTER — Encounter (HOSPITAL_COMMUNITY)
Admission: RE | Admit: 2021-11-28 | Discharge: 2021-11-28 | Disposition: A | Discharge status: Home / Self Care | Payer: Medicare HMO | Source: Ambulatory Visit | Attending: Nephrology | Admitting: Nephrology

## 2021-11-28 DIAGNOSIS — N185 Chronic kidney disease, stage 5: Secondary | ICD-10-CM | POA: Diagnosis not present

## 2021-11-28 DIAGNOSIS — D631 Anemia in chronic kidney disease: Secondary | ICD-10-CM | POA: Diagnosis not present

## 2021-11-28 LAB — POCT HEMOGLOBIN-HEMACUE: Hemoglobin: 10.1 g/dL — ABNORMAL LOW (ref 12.0–15.0)

## 2021-11-28 MED ORDER — EPOETIN ALFA-EPBX 3000 UNIT/ML IJ SOLN
3000.0000 [IU] | Freq: Once | INTRAMUSCULAR | Status: DC
Start: 2021-11-28 — End: 2021-11-29

## 2021-12-04 ENCOUNTER — Telehealth: Payer: Self-pay

## 2021-12-04 NOTE — Telephone Encounter (Signed)
[facility] to advise [place] that the pt's Octreotide is out of stock in the pre-fill syringes. They have the vials of it in stock. (They will supply needles). Please call and speak with pharmacist @ [phone].

## 2021-12-05 NOTE — Telephone Encounter (Signed)
Returned the pt's call and advised that her Octreotide was out of stock in the pre-filled syringes but they have the vials. They are just waiting on response from Dr. Abbey Chatters

## 2021-12-06 ENCOUNTER — Ambulatory Visit: Payer: Medicare HMO | Admitting: Internal Medicine

## 2021-12-06 ENCOUNTER — Other Ambulatory Visit: Payer: Self-pay

## 2021-12-06 ENCOUNTER — Encounter: Payer: Self-pay | Admitting: Internal Medicine

## 2021-12-06 VITALS — BP 141/67 | HR 66 | Temp 96.9°F | Ht 62.0 in | Wt 178.8 lb

## 2021-12-06 DIAGNOSIS — K219 Gastro-esophageal reflux disease without esophagitis: Secondary | ICD-10-CM | POA: Diagnosis not present

## 2021-12-06 DIAGNOSIS — K5900 Constipation, unspecified: Secondary | ICD-10-CM

## 2021-12-06 DIAGNOSIS — K5521 Angiodysplasia of colon with hemorrhage: Secondary | ICD-10-CM | POA: Diagnosis not present

## 2021-12-06 IMAGING — US US RENAL
1 series · 14 of 25 positions shown · non-contrast
Comparison: None.

CLINICAL DATA: Acute renal failure.

EXAM:
RENAL / URINARY TRACT ULTRASOUND COMPLETE

[Series 1: us renal · 14 of 49 slices shown]
[im 1/49]
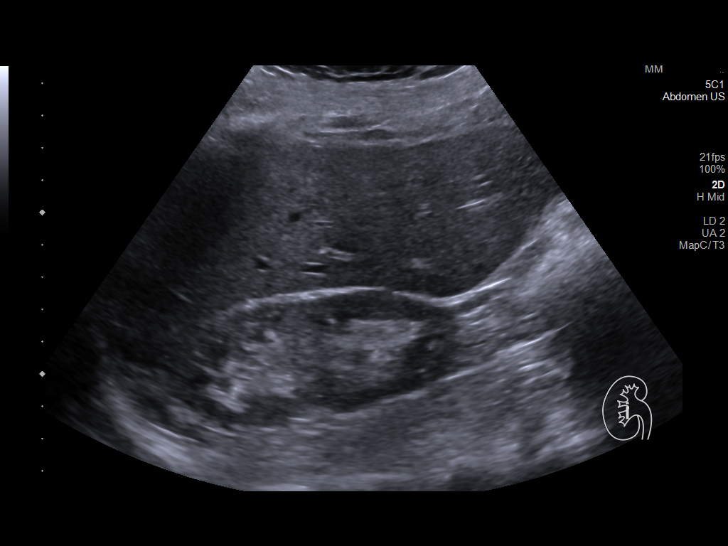
[im 5/49]
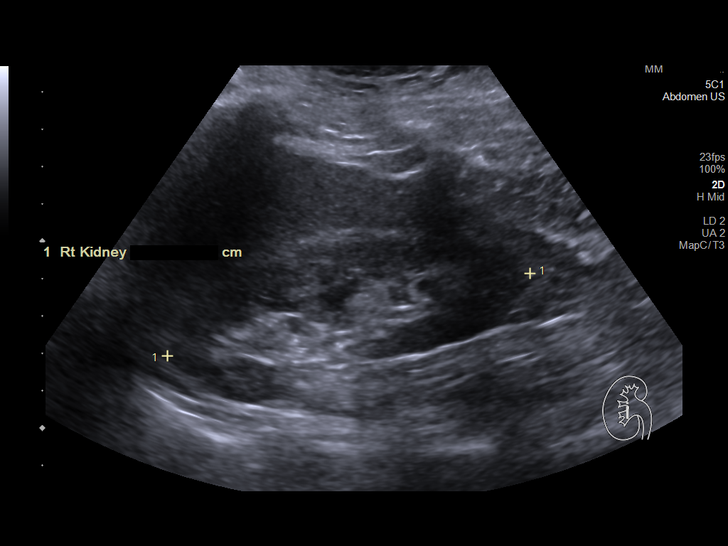
[im 9/49]
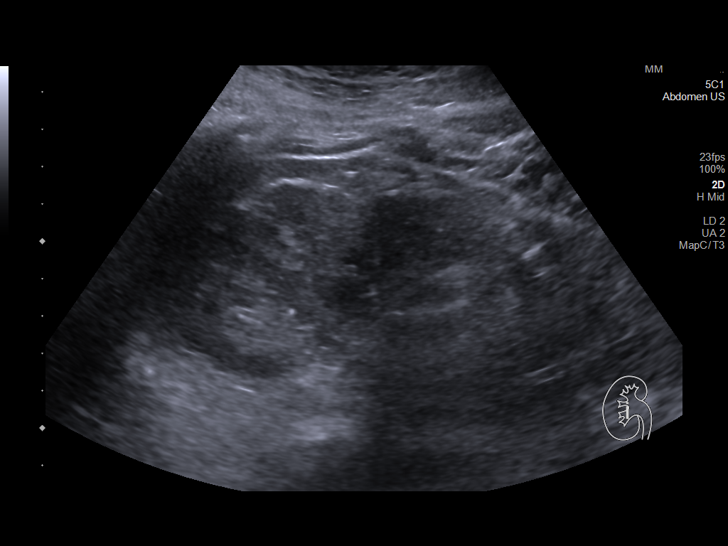
[im 13/49]
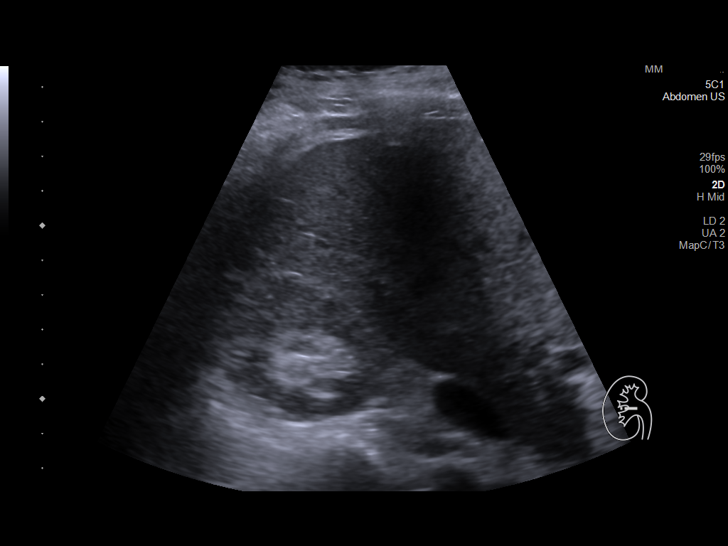
[im 17/49]
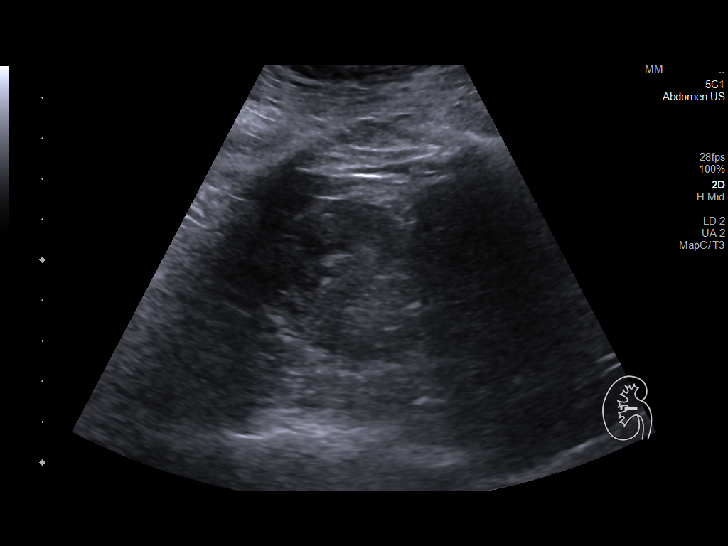
[im 19/49]
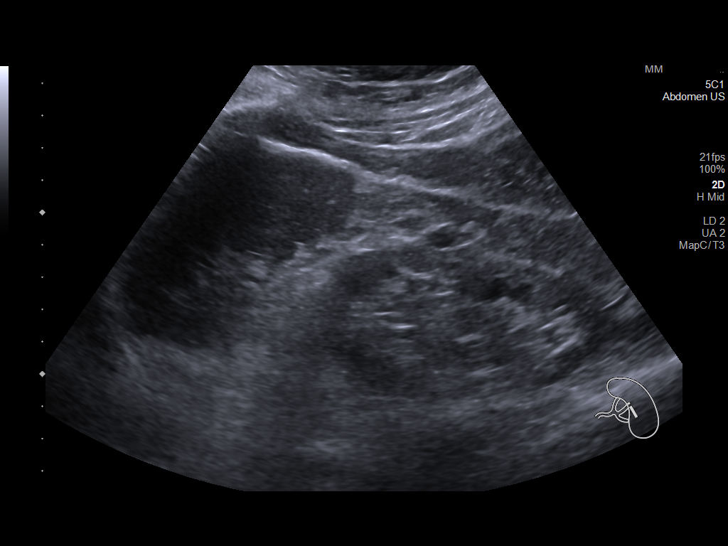
[im 23/49]
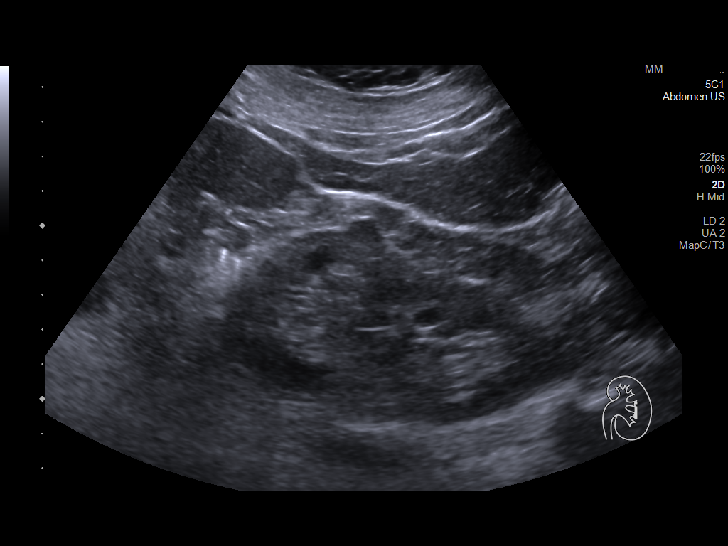
[im 27/49]
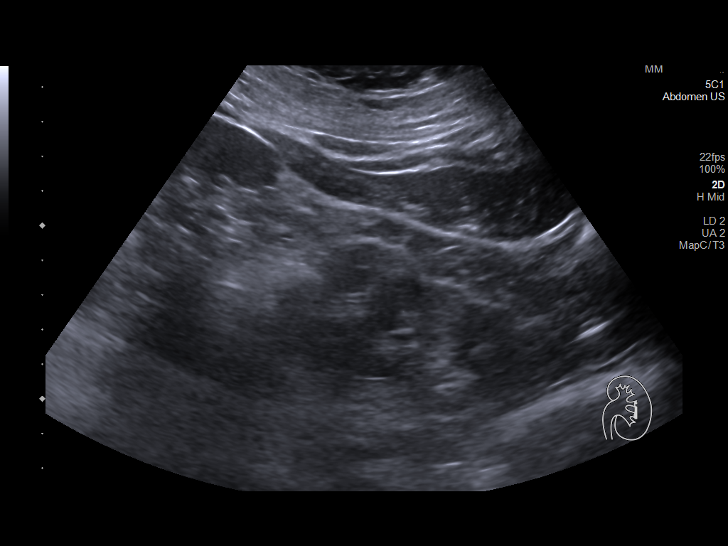
[im 31/49]
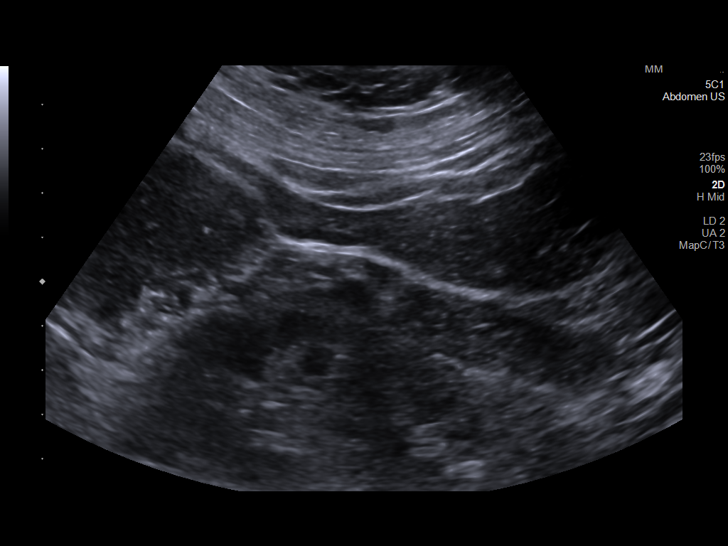
[im 33/49]
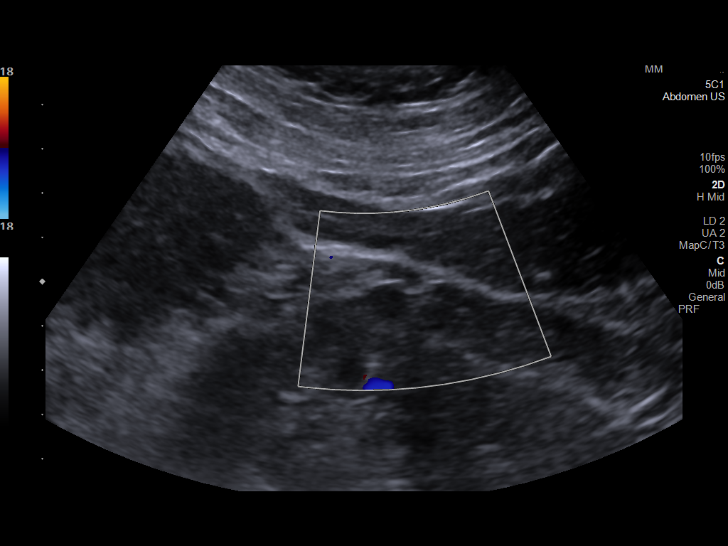
[im 37/49]
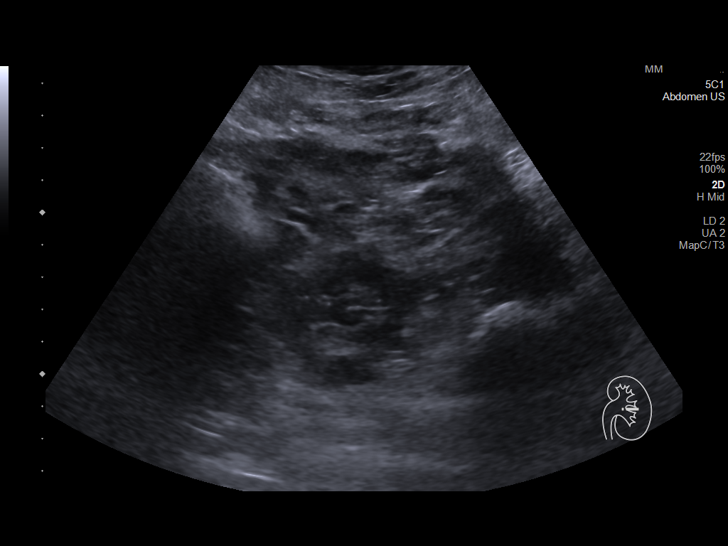
[im 41/49]
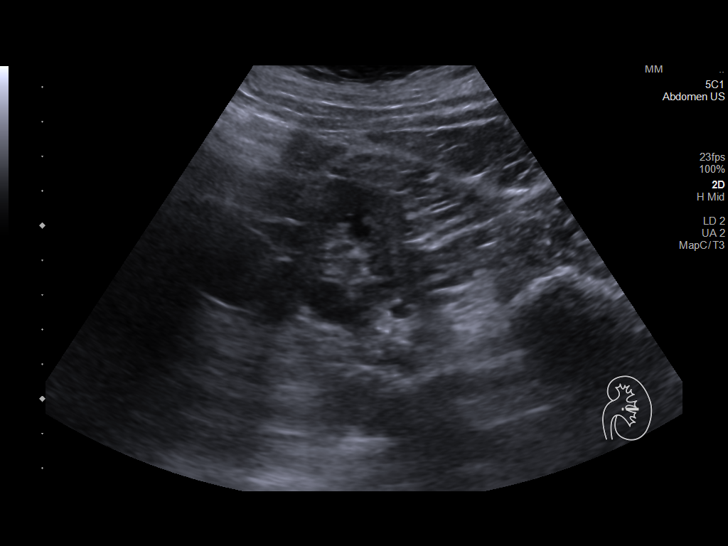
[im 45/49]
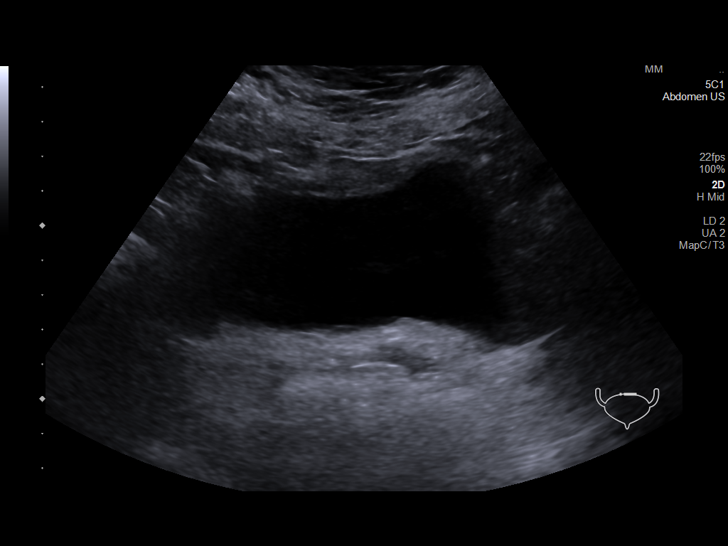
[im 49/49]
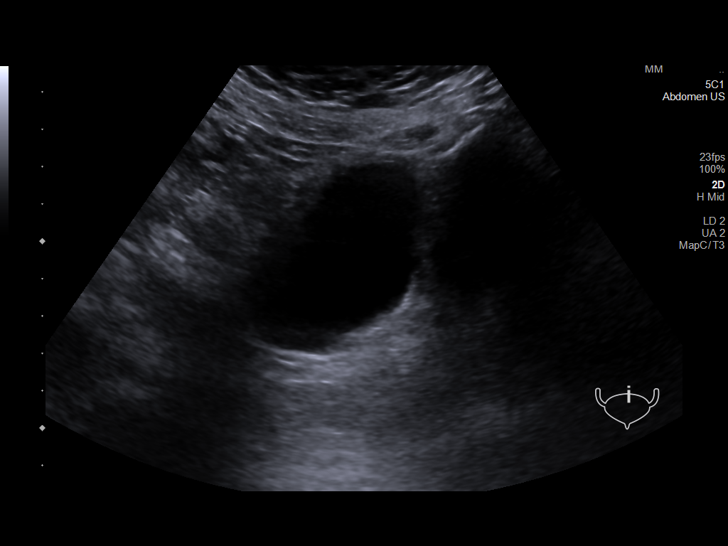

[14 of 25 positions shown; findings below may reference images not displayed]

FINDINGS: Right Kidney:

Renal measurements: 10.0 x 3.6 x 4.7 cm = volume: 86 mL.
Echogenicity within normal limits. No mass or hydronephrosis
visualized.

Left Kidney:

Renal measurements: 10.3 x 4.5 x 6.3 cm = volume: 154 mL. Normal
renal cortical thickness and echogenicity. No hydronephrosis. Small
cyst left kidney measuring 1.0 x 0.6 x 1.1 cm.

Bladder:

Appears normal for degree of bladder distention.

Other:

None.
IMPRESSION: No hydronephrosis.

## 2021-12-06 MED ORDER — LUBIPROSTONE 24 MCG PO CAPS: 24.0000 ug | ORAL_CAPSULE | Freq: Two times a day (BID) | ORAL | 3 refills | Status: AC

## 2021-12-06 MED ORDER — LUBIPROSTONE 24 MCG PO CAPS: 24.0000 ug | ORAL_CAPSULE | Freq: Two times a day (BID) | ORAL | 3 refills | Status: DC

## 2021-12-06 NOTE — Patient Instructions (Signed)
I have refilled your Amitiza [facility] [place].  I will [facility] in regards to your octreotide today.  Otherwise follow-up in 6 months or sooner if needed.  It was great seeing you again today.  Dr. Abbey Chatters  At [facility] we value your feedback. You may receive a survey about your visit today. Please share your experience as we strive to create trusting relationships with our patients to provide genuine, compassionate, quality care.  We appreciate your understanding and patience as we review any laboratory studies, imaging, and other diagnostic tests that are ordered as we care for you. Our office policy is 5 business days for review of these results, and any emergent or urgent results are addressed in a timely manner for your best interest. If you do not hear from our office in 1 week, please contact us.   We also encourage the use of MyChart, which contains your medical information for your review as well. If you are not enrolled in this feature, an access code is on this after visit summary for your convenience. Thank you for allowing [place] to be involved in your care.  It was great to see you today!  I hope you have a great rest of your Fall!    Elon Alas. Abbey Chatters, D.O. Gastroenterology and Hepatology [facility]

## 2021-12-06 NOTE — Telephone Encounter (Signed)
Called and spoke with specialty pharmacy.  Patient to have different formulation of octreotide with vials.  Pharmacy to call patient to give her instructions on use.

## 2021-12-06 NOTE — Telephone Encounter (Signed)
noted 

## 2021-12-06 NOTE — Progress Notes (Signed)
Referring Provider: Andres Shad, * Primary Care Physician:  Andres Shad, MD Primary GI:  Dr. Abbey Chatters  Chief Complaint  Patient presents with   Constipation    "Little bit".     HPI:   [name] [name] is a 75 y.o. female who presents to the clinic today for follow-up visit.  She has had 2 hospitalizations acute blood loss anemia.  Hospitalization in early December 2021, she underwent EGD which was relatively unremarkable.  Subsequent small bowel capsule endoscopy revealed multiple small bowel AVMs.  She subsequently underwent enteroscopy with APC of multiple AVM lesions on 11/27/2020.     She was discharged from the facility and did well until she suffered a syncopal episode at home.  She reported back to the ER 12/05/2020 and was found to be anemic again with hemoglobin of 7.  She was started on subcutaneous octreotide during that hospitalization.  She did relatively well on this medication until April 2022 when she again had worsening anemia.  She presented to the ER for transfusion.  She was referred to [facility] and underwent double-balloon enteroscopy on 05/19/2021 by Dr. Durene Fruits.  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  Since that time she has done well.  Most recent hemoglobin 10.1 on 11/28/21   Of note, patient is on Eliquis for afib.   Also has chronic reflux which is well controlled on pantoprazole 40 mg twice daily.  Today she states she is doing well.  Denies any rectal bleeding or melena.  Does have chronic constipation which was well controlled on Amitiza 24 mg twice daily, requesting refills today. Previously tried Linzess which also did not work.  Past Medical History:  Diagnosis Date   Anemia    Chronic kidney disease    Constipation 07/17/2017   GERD (gastroesophageal reflux disease)    Gout    Grade II diastolic dysfunction  71/05/9677   Hyperlipidemia    Hypertension    Hypokalemia    OSA (obstructive sleep apnea) 08/09/2020   PAF (paroxysmal atrial fibrillation) (HCC)    PAF (paroxysmal atrial fibrillation) (HCC)    PUD (peptic ulcer disease)    Syncope    Neurocardiogenic    Past Surgical History:  Procedure Laterality Date   ABDOMINAL AORTOGRAM W/LOWER EXTREMITY Right 12/13/2020   Procedure: ABDOMINAL AORTOGRAM W/LOWER EXTREMITY;  Surgeon: Serafina Mitchell, MD;  Location: [place] [facility];  Service: Cardiovascular;  Laterality: Right;   ABDOMINAL HYSTERECTOMY     AMPUTATION Right 12/14/2020   Procedure: Right fifth toe amputation;  Surgeon: Rosetta Posner, MD;  [facility] OR;  Service: Vascular;  Laterality: Right;   CATARACT EXTRACTION Bilateral    COLONOSCOPY N/A 10/07/2014   Dr. Oneida Alar: redundant left colon, moderate sized external hemorrhoids    COLONOSCOPY N/A 03/11/2020   Surgeon: Danie Binder, MD;  9 polyps, majority of which were tubular adenomas, nodular mucosa in the anus s/p biopsy which was benign.   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   ESOPHAGOGASTRODUODENOSCOPY (EGD) WITH PROPOFOL N/A 11/25/2020    Surgeon: Daneil Dolin, MD; Normal, s/p capsule deployment   [place] N/A 11/25/2020    Surgeon: Daneil Dolin, MD; 4/nonbleeding AVMs in the proximal small bowel large  fresh blood, 2 bleeding lymphangiectasis in SB.    HOT HEMOSTASIS  11/27/2020   Procedure: HOT HEMOSTASIS (ARGON PLASMA COAGULATION/BICAP);  Surgeon: Montez Morita, Quillian Quince, MD;  [facility];  Service: Gastroenterology;;   LIPOMA RESECTION     PERIPHERAL VASCULAR INTERVENTION Right 12/13/2020   Procedure: PERIPHERAL VASCULAR INTERVENTION;  Surgeon: Serafina Mitchell, MD;  Location: [place] [facility];  Service: Cardiovascular;  Laterality: Right;   SFA/PT/AT   POLYPECTOMY  03/11/2020   Procedure: POLYPECTOMY;  Surgeon: Danie Binder, MD;  [facility];  Service: Endoscopy;;  cecal, transverse, descending, sigmoid   TEE WITHOUT CARDIOVERSION N/A 05/10/2021   Procedure: TRANSESOPHAGEAL ECHOCARDIOGRAM (TEE) WATCHMEN EVALUATION;  Surgeon: Josue Hector, MD;  Location: [place] [facility];  Service: Cardiovascular;  Laterality: N/A;    Current Outpatient Medications  Medication Sig Dispense Refill   acetaminophen (TYLENOL) 500 MG tablet Take 1,000 mg by mouth every 6 (six) hours as needed for moderate pain.     albuterol (VENTOLIN HFA) 108 (90 Base) MCG/ACT inhaler Inhale 1-2 puffs into the lungs every 6 (six) hours as needed for wheezing or shortness of breath.      apixaban (ELIQUIS) 5 MG TABS tablet Take 1 tablet (5 mg total) by mouth 2 (two) times daily. 180 tablet 1   calcium carbonate (OS-CAL - DOSED IN MG OF ELEMENTAL CALCIUM) 1250 (500 Ca) MG tablet Take 1 tablet by mouth. Takes MWF     cetirizine (ZYRTEC) 10 MG tablet Take 10 mg by mouth daily as needed for allergies.     Cholecalciferol (VITAMIN D-3) 25 MCG (1000 UT) CAPS Take 1,000 Units by mouth daily.     colchicine 0.6 MG tablet Take 1 tablet by mouth daily as needed (gout flare).     cyclobenzaprine (FLEXERIL) 5 MG tablet Take 5 mg by mouth 3 (three) times daily as needed for muscle spasms.     diclofenac Sodium (VOLTAREN) 1 % GEL Apply 1 application topically 4 (four) times daily as needed (pain).     ELDERBERRY PO Take 2 capsules by mouth daily.      epoetin alfa-epbx (RETACRIT) 3000 UNIT/ML injection Inject 3,000 Units into the skin every 14 (fourteen) days.     ferrous sulfate 325 (65 FE) MG tablet Take 325 mg by mouth daily with breakfast.     flecainide (TAMBOCOR) 50 MG tablet TAKE 1 TABLET BY MOUTH TWICE A DAY 180 tablet 3   fluticasone (FLONASE) 50 MCG/ACT nasal spray Place 1 spray into both nostrils daily as needed for allergies or rhinitis.      Fluticasone-Salmeterol (ADVAIR) 500-50 MCG/DOSE AEPB Inhale 1 puff into the lungs every 12 (twelve) hours.     furosemide (LASIX) 40 MG tablet TAKE 1 TABLET BY MOUTH EVERY DAY 90 tablet 1   gabapentin (NEURONTIN) 300 MG capsule Take 300 mg by mouth 3 (three) times daily as needed (nerve pain).     hydrocortisone (ANUSOL-HC) 2.5 % rectal cream Place 1 application rectally 2 (two) times daily. 30 g 1   losartan (COZAAR) 50 MG tablet Take 1 tablet (50 mg total) by mouth daily. 90 tablet 3   octreotide (SANDOSTATIN) 50 MCG/ML SOLN injection Inject 2 mLs (100 mcg total) into the skin every 12 (twelve) hours. 120 mL 2   pantoprazole (PROTONIX) 40 MG tablet TAKE 1 TABLET BY MOUTH TWICE A DAY BEFORE A MEAL 60 tablet 1   Polyethylene Glycol 3350 (CLEARLAX PO) Take by mouth as needed. 17 grams  potassium chloride SA (KLOR-CON) 20 MEQ tablet Take 20 mEq by mouth in the morning, at noon, and at bedtime.     rosuvastatin (CRESTOR) 10 MG tablet TAKE 1 TABLET BY MOUTH EVERY DAY 30 tablet 0   amLODipine (NORVASC) 2.5 MG tablet Take 2.5 mg by mouth daily. (Patient not taking: Reported on 12/06/2021)     feeding supplement (ENSURE ENLIVE / ENSURE PLUS) LIQD Take 237 mLs by mouth 2 (two) times daily between meals. 237 mL 12   lubiprostone (AMITIZA) 24 MCG capsule Take 1 capsule (24 mcg total) by mouth in the morning and at bedtime. 180 capsule 3   vitamin C (ASCORBIC ACID) 250 MG tablet Take 500 mg by mouth daily. (Patient not taking: Reported on 12/06/2021)     No current facility-administered medications for this visit.    Allergies as of 12/06/2021 - Review Complete 12/06/2021  Allergen Reaction Noted   Amlodipine  08/03/2020   Amoxicillin Hives 03/04/2020   Doxycycline  11/23/2020   Acetaminophen-codeine Hives and Rash 09/16/2020   Allopurinol Hives and Rash    Tramadol Hives and Rash     Family History  Problem Relation Age of Onset   Stroke Mother    Dementia Father    Cancer - Other Sister     Atrial fibrillation Sister    Heart failure Sister    Colon cancer Maternal Grandmother        diagnosed in her 47s    Social History   Socioeconomic History   Marital status: Single    Spouse name: Not on file   Number of children: Not on file   Years of education: Not on file   Highest education level: Not on file  Occupational History   Occupation: Retired  Tobacco Use   Smoking status: Former    Packs/day: 1.00    Years: 20.00    Pack years: 20.00    Types: Cigarettes    Start date: 03/16/1964    Quit date: 10/07/1997    Years since quitting: 24.1   Smokeless tobacco: Never  Vaping Use   Vaping Use: Never used  Substance and Sexual Activity   Alcohol use: No    Alcohol/week: 0.0 standard drinks   Drug use: No   Sexual activity: Yes    Partners: Male  Other Topics Concern   Not on file  Social History Narrative   Not on file   Social Determinants of Health   Financial Resource Strain: Not on file  Food Insecurity: Not on file  Transportation Needs: Not on file  Physical Activity: Not on file  Stress: Not on file  Social Connections: Not on file    Subjective: Review of Systems  Constitutional:  Negative for chills and fever.  HENT:  Negative for congestion and hearing loss.   Eyes:  Negative for blurred vision and double vision.  Respiratory:  Negative for cough and shortness of breath.   Cardiovascular:  Negative for chest pain and palpitations.  Gastrointestinal:  Positive for constipation. Negative for abdominal pain, blood in stool, diarrhea, heartburn, melena and vomiting.  Genitourinary:  Negative for dysuria and urgency.  Musculoskeletal:  Negative for joint pain and myalgias.  Skin:  Negative for itching and rash.  Neurological:  Negative for dizziness and headaches.  Psychiatric/Behavioral:  Negative for depression. The patient is not nervous/anxious.     Objective: BP (!) 141/67    Pulse 66    Temp (!) 96.9 F (36.1 C) (Temporal)  Ht 5'  2" (1.575 m)    Wt 178 lb 12.8 oz (81.1 kg)    BMI 32.70 kg/m  Physical Exam Constitutional:      Appearance: Normal appearance.  HENT:     Head: Normocephalic and atraumatic.  Eyes:     Extraocular Movements: Extraocular movements intact.     Conjunctiva/sclera: Conjunctivae normal.  Cardiovascular:     Rate and Rhythm: Normal rate and regular rhythm.  Pulmonary:     Effort: Pulmonary effort is normal.     Breath sounds: Normal breath sounds.  Abdominal:     General: Bowel sounds are normal.     Palpations: Abdomen is soft.  Musculoskeletal:        General: No swelling. Normal range of motion.     Cervical back: Normal range of motion and neck supple.  Skin:    General: Skin is warm and dry.     Coloration: Skin is not jaundiced.  Neurological:     General: No focal deficit present.     Mental Status: She is alert and oriented to person, place, and time.  Psychiatric:        Mood and Affect: Mood normal.        Behavior: Behavior normal.     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   Plan: Discussed AVMs in depth with patient  She is status post a balloon enteroscopy 05/19/2021 at [facility] by Dr. Durene Fruits with a total of 27 AVMs ablated by APC in the duodenum and jejunum.  Most recent hemoglobin 10.1 on 11/28/21  Continue on subcutaneous octreotide.  We may need to refer her back for repeat double-balloon enteroscopy if further bleeding occurs.  For her constipation, I we will refill her Amitiza 24 mg twice daily today.  Reflux well-controlled on pantoprazole 40 mg twice daily.  We will continue.  Consider weaning to once daily as tolerated.  Otherwise follow-up in 6 months   12/06/2021 11:19 AM   Disclaimer: This note was dictated with voice recognition software. Similar sounding words can inadvertently be transcribed and may not be corrected upon review.

## 2021-12-08 ENCOUNTER — Other Ambulatory Visit (HOSPITAL_COMMUNITY)
Admission: RE | Admit: 2021-12-08 | Discharge: 2021-12-08 | Disposition: A | Discharge status: Home / Self Care | Payer: Medicare HMO | Source: Ambulatory Visit | Attending: Nephrology | Admitting: Nephrology

## 2021-12-08 DIAGNOSIS — N184 Chronic kidney disease, stage 4 (severe): Secondary | ICD-10-CM | POA: Diagnosis present

## 2021-12-08 DIAGNOSIS — E211 Secondary hyperparathyroidism, not elsewhere classified: Secondary | ICD-10-CM | POA: Insufficient documentation

## 2021-12-08 DIAGNOSIS — I129 Hypertensive chronic kidney disease with stage 1 through stage 4 chronic kidney disease, or unspecified chronic kidney disease: Secondary | ICD-10-CM | POA: Insufficient documentation

## 2021-12-08 DIAGNOSIS — D638 Anemia in other chronic diseases classified elsewhere: Secondary | ICD-10-CM | POA: Insufficient documentation

## 2021-12-08 DIAGNOSIS — R809 Proteinuria, unspecified: Secondary | ICD-10-CM | POA: Diagnosis present

## 2021-12-08 DIAGNOSIS — D508 Other iron deficiency anemias: Secondary | ICD-10-CM | POA: Insufficient documentation

## 2021-12-08 LAB — RENAL FUNCTION PANEL
Albumin: 4.1 g/dL (ref 3.5–5.0)
Anion gap: 9 (ref 5–15)
BUN: 27 mg/dL — ABNORMAL HIGH (ref 8–23)
CO2: 26 mmol/L (ref 22–32)
Calcium: 8.9 mg/dL (ref 8.9–10.3)
Chloride: 103 mmol/L (ref 98–111)
Creatinine, Ser: 1.81 mg/dL — ABNORMAL HIGH (ref 0.44–1.00)
GFR, Estimated: 29 mL/min — ABNORMAL LOW (ref >60)
Glucose, Bld: 100 mg/dL — ABNORMAL HIGH (ref 70–99)
Phosphorus: 4.1 mg/dL (ref 2.5–4.6)
Potassium: 4.2 mmol/L (ref 3.5–5.1)
Sodium: 138 mmol/L (ref 135–145)

## 2021-12-08 LAB — IRON AND TIBC
Iron: 44 ug/dL (ref 28–170)
Saturation Ratios: 12 % (ref 10.4–31.8)
TIBC: 352 ug/dL (ref 250–450)
UIBC: 308 ug/dL

## 2021-12-08 LAB — CBC
HCT: 33.8 % — ABNORMAL LOW (ref 36.0–46.0)
Hemoglobin: 10.4 g/dL — ABNORMAL LOW (ref 12.0–15.0)
MCH: 30.1 pg (ref 26.0–34.0)
MCHC: 30.8 g/dL (ref 30.0–36.0)
MCV: 98 fL (ref 80.0–100.0)
Platelets: 279 10*3/uL (ref 150–400)
RBC: 3.45 MIL/uL — ABNORMAL LOW (ref 3.87–5.11)
RDW: 13.8 % (ref 11.5–15.5)
WBC: 4.6 10*3/uL (ref 4.0–10.5)
nRBC: 0 % (ref 0.0–0.2)

## 2021-12-08 LAB — PROTEIN / CREATININE RATIO, URINE
Creatinine, Urine: 93.19 mg/dL
Protein Creatinine Ratio: 0.19 mg/mg{Cre} — ABNORMAL HIGH (ref 0.00–0.15)
Total Protein, Urine: 18 mg/dL

## 2021-12-08 LAB — VITAMIN D 25 HYDROXY (VIT D DEFICIENCY, FRACTURES): Vit D, 25-Hydroxy: 47.35 ng/mL (ref 30–100)

## 2021-12-08 LAB — FERRITIN: Ferritin: 50 ng/mL (ref 11–307)

## 2021-12-09 LAB — PTH, INTACT AND CALCIUM
Calcium, Total (PTH): 9.3 mg/dL (ref 8.7–10.3)
PTH: 84 pg/mL — ABNORMAL HIGH (ref 15–65)

## 2021-12-12 ENCOUNTER — Encounter (HOSPITAL_COMMUNITY): Payer: Self-pay

## 2021-12-12 ENCOUNTER — Encounter (HOSPITAL_COMMUNITY)
Admission: RE | Admit: 2021-12-12 | Discharge: 2021-12-12 | Disposition: A | Discharge status: Home / Self Care | Payer: Medicare HMO | Source: Ambulatory Visit | Attending: Nephrology | Admitting: Nephrology

## 2021-12-12 DIAGNOSIS — N185 Chronic kidney disease, stage 5: Secondary | ICD-10-CM | POA: Diagnosis not present

## 2021-12-12 LAB — POCT HEMOGLOBIN-HEMACUE: Hemoglobin: 10 g/dL — ABNORMAL LOW (ref 12.0–15.0)

## 2021-12-12 MED ORDER — EPOETIN ALFA-EPBX 3000 UNIT/ML IJ SOLN: 3000.0000 [IU] | Freq: Once | INTRAMUSCULAR | Status: DC

## 2021-12-17 ENCOUNTER — Other Ambulatory Visit: Payer: Self-pay | Admitting: Family Medicine

## 2021-12-26 ENCOUNTER — Encounter (HOSPITAL_COMMUNITY): Payer: Self-pay

## 2021-12-26 ENCOUNTER — Encounter (HOSPITAL_COMMUNITY)
Admission: RE | Admit: 2021-12-26 | Discharge: 2021-12-26 | Disposition: A | Discharge status: Home / Self Care | Payer: Medicare HMO | Source: Ambulatory Visit | Attending: Nephrology | Admitting: Nephrology

## 2021-12-26 DIAGNOSIS — D631 Anemia in chronic kidney disease: Secondary | ICD-10-CM | POA: Insufficient documentation

## 2021-12-26 DIAGNOSIS — E211 Secondary hyperparathyroidism, not elsewhere classified: Secondary | ICD-10-CM | POA: Insufficient documentation

## 2021-12-26 DIAGNOSIS — E559 Vitamin D deficiency, unspecified: Secondary | ICD-10-CM | POA: Diagnosis not present

## 2021-12-26 DIAGNOSIS — N185 Chronic kidney disease, stage 5: Secondary | ICD-10-CM | POA: Diagnosis present

## 2021-12-26 LAB — POCT HEMOGLOBIN-HEMACUE: Hemoglobin: 9.9 g/dL — ABNORMAL LOW (ref 12.0–15.0)

## 2021-12-26 MED ORDER — EPOETIN ALFA-EPBX 3000 UNIT/ML IJ SOLN
INTRAMUSCULAR | Status: AC
  Filled 2021-12-26: qty 1

## 2021-12-26 MED ORDER — EPOETIN ALFA-EPBX 3000 UNIT/ML IJ SOLN
3000.0000 [IU] | Freq: Once | INTRAMUSCULAR | Status: AC
  Administered 2021-12-26: 3000 [IU] via SUBCUTANEOUS

## 2022-01-03 ENCOUNTER — Telehealth: Payer: Self-pay

## 2022-01-03 NOTE — Telephone Encounter (Signed)
PA done on Cover My Meds for Octreotide Acetate 100MCG/ML Syringes .waiting on a response.

## 2022-01-04 NOTE — Telephone Encounter (Signed)
Faxed and will send to scan in the morning

## 2022-01-04 NOTE — Telephone Encounter (Signed)
Done

## 2022-01-04 NOTE — Telephone Encounter (Signed)
On your desk in a blue folder there is a paper that needs to be filled out and signed for pt to receive her Octreotide. Just advise when done and I will fax

## 2022-01-05 NOTE — Telephone Encounter (Signed)
PA for Octreotide Acetate Soln Pref Syr approved from 12/24/2021 --12/23/2022 as long as the pt remain enrolled in our Medicare Part D Rx drug plan. PA faxed to [place] @ [phone].  Sent to scan for our/ the pt's records.

## 2022-01-05 NOTE — Telephone Encounter (Signed)
Phoned to [place] spoke with Lovena Le and was advised by her that there are 2 on file at this time one for Octreotide Acetate Solution and the one for th prefilled syringes. The solution was denied but the prefilled syringes is still in clinical review. Faxed questionnaire was completed by Dr. Abbey Chatters yesterday evening and faxed to them before I left. I advised her Lovena Le) of this and was advised to call back in 8 minutes to speak with them because they were not open. Will call back

## 2022-01-05 NOTE — Telephone Encounter (Signed)
Phoned back to [facility] and spoke with Dorenda and was advised that the Octreotide Acetate solution was the one that was denied not the prefilled syringes. They received the questionnaire from [place] on yesterday evening where Dr. Abbey Chatters filled out with explanation of why this medication was needed for the pt and that it is in clinical review and it may be later today before we get the decision.

## 2022-01-09 ENCOUNTER — Other Ambulatory Visit: Payer: Self-pay

## 2022-01-09 ENCOUNTER — Encounter (HOSPITAL_COMMUNITY): Payer: Self-pay

## 2022-01-09 ENCOUNTER — Encounter (HOSPITAL_COMMUNITY)
Admission: RE | Admit: 2022-01-09 | Discharge: 2022-01-09 | Disposition: A | Discharge status: Home / Self Care | Payer: Medicare HMO | Source: Ambulatory Visit | Attending: Nephrology | Admitting: Nephrology

## 2022-01-09 DIAGNOSIS — N185 Chronic kidney disease, stage 5: Secondary | ICD-10-CM | POA: Diagnosis not present

## 2022-01-09 LAB — POCT HEMOGLOBIN-HEMACUE: Hemoglobin: 10.9 g/dL — ABNORMAL LOW (ref 12.0–15.0)

## 2022-01-09 MED ORDER — EPOETIN ALFA-EPBX 3000 UNIT/ML IJ SOLN: 3000.0000 [IU] | Freq: Once | INTRAMUSCULAR | Status: DC

## 2022-01-23 ENCOUNTER — Encounter (HOSPITAL_COMMUNITY)
Admission: RE | Admit: 2022-01-23 | Discharge: 2022-01-23 | Disposition: A | Discharge status: Home / Self Care | Payer: Medicare HMO | Source: Ambulatory Visit | Attending: Nephrology | Admitting: Nephrology

## 2022-01-23 ENCOUNTER — Telehealth: Payer: Self-pay | Admitting: Internal Medicine

## 2022-01-23 DIAGNOSIS — N185 Chronic kidney disease, stage 5: Secondary | ICD-10-CM | POA: Diagnosis not present

## 2022-01-23 LAB — CBC WITH DIFFERENTIAL/PLATELET
Abs Immature Granulocytes: 0.01 10*3/uL (ref 0.00–0.07)
Basophils Absolute: 0 10*3/uL (ref 0.0–0.1)
Basophils Relative: 0 %
Eosinophils Absolute: 0.4 10*3/uL (ref 0.0–0.5)
Eosinophils Relative: 7 %
HCT: 33.7 % — ABNORMAL LOW (ref 36.0–46.0)
Hemoglobin: 10.4 g/dL — ABNORMAL LOW (ref 12.0–15.0)
Immature Granulocytes: 0 %
Lymphocytes Relative: 41 %
Lymphs Abs: 2.1 10*3/uL (ref 0.7–4.0)
MCH: 30.1 pg (ref 26.0–34.0)
MCHC: 30.9 g/dL (ref 30.0–36.0)
MCV: 97.4 fL (ref 80.0–100.0)
Monocytes Absolute: 0.4 10*3/uL (ref 0.1–1.0)
Monocytes Relative: 8 %
Neutro Abs: 2.3 10*3/uL (ref 1.7–7.7)
Neutrophils Relative %: 44 %
Platelets: 274 10*3/uL (ref 150–400)
RBC: 3.46 MIL/uL — ABNORMAL LOW (ref 3.87–5.11)
RDW: 13.7 % (ref 11.5–15.5)
WBC: 5.2 10*3/uL (ref 4.0–10.5)
nRBC: 0 % (ref 0.0–0.2)

## 2022-01-23 LAB — RENAL FUNCTION PANEL
Albumin: 4.2 g/dL (ref 3.5–5.0)
Anion gap: 5 (ref 5–15)
BUN: 24 mg/dL — ABNORMAL HIGH (ref 8–23)
CO2: 28 mmol/L (ref 22–32)
Calcium: 8.8 mg/dL — ABNORMAL LOW (ref 8.9–10.3)
Chloride: 103 mmol/L (ref 98–111)
Creatinine, Ser: 1.93 mg/dL — ABNORMAL HIGH (ref 0.44–1.00)
GFR, Estimated: 27 mL/min — ABNORMAL LOW (ref >60)
Glucose, Bld: 92 mg/dL (ref 70–99)
Phosphorus: 3.3 mg/dL (ref 2.5–4.6)
Potassium: 4.1 mmol/L (ref 3.5–5.1)
Sodium: 136 mmol/L (ref 135–145)

## 2022-01-23 LAB — POCT HEMOGLOBIN-HEMACUE: Hemoglobin: 10.5 g/dL — ABNORMAL LOW (ref 12.0–15.0)

## 2022-01-23 MED ORDER — EPOETIN ALFA-EPBX 3000 UNIT/ML IJ SOLN: 3000.0000 [IU] | Freq: Once | INTRAMUSCULAR | Status: DC

## 2022-01-23 NOTE — Progress Notes (Signed)
Pt unable to void. Unable to collect specimen for protein urine.

## 2022-01-23 NOTE — Telephone Encounter (Signed)
Pt seen Dr Abbey Chatters on 12/06/2021 and was told to call if she had any reaction to her medication. Please call [phone]

## 2022-01-23 NOTE — Telephone Encounter (Signed)
Amended with prior note

## 2022-01-23 NOTE — Telephone Encounter (Signed)
Pt seen Dr Abbey Chatters on 12/06/2021 and was told to call if she had any reaction to her medication. Please call [phone]      Note     Dr. Abbey Chatters, I returned the pt's call and was advised while she was having her blood drawn this morning she just happen to ask the tech where they think the bruising on her arms was coming from ? Medications (blood thinners) but was advised to ask you. Then the pt returned home and she was reading the side effects of her Octreotide Acetate and one was bruising and episode of rapid heartbeat. Pt has A-fib. She is most concerned with the A fib . She stated it has only happened once yesterday and not since then. The pt has been on this medication for a year and this is the first time this has happened. Pt is questioning the continuation of the medication. Please advise.

## 2022-02-06 ENCOUNTER — Encounter (HOSPITAL_COMMUNITY)
Admission: RE | Admit: 2022-02-06 | Discharge: 2022-02-06 | Disposition: A | Discharge status: Home / Self Care | Payer: Medicare HMO | Source: Ambulatory Visit | Attending: Nephrology | Admitting: Nephrology

## 2022-02-06 DIAGNOSIS — N185 Chronic kidney disease, stage 5: Secondary | ICD-10-CM | POA: Insufficient documentation

## 2022-02-06 LAB — POCT HEMOGLOBIN-HEMACUE: Hemoglobin: 10.3 g/dL — ABNORMAL LOW (ref 12.0–15.0)

## 2022-02-06 MED ORDER — EPOETIN ALFA-EPBX 3000 UNIT/ML IJ SOLN: 3000.0000 [IU] | Freq: Once | INTRAMUSCULAR | Status: DC

## 2022-02-15 ENCOUNTER — Other Ambulatory Visit: Payer: Self-pay | Admitting: Family Medicine

## 2022-02-16 ENCOUNTER — Other Ambulatory Visit: Payer: Self-pay | Admitting: Internal Medicine

## 2022-02-16 NOTE — Telephone Encounter (Signed)
Patient is currently receiving Rx for 50 mcg/ml at pharmacy was out of the 100 mcg/ml. We need to verify with the pharmacy that they do have the 100 mcg/ml in stock.

## 2022-02-17 IMAGING — DX DG CHEST 1V PORT
1 series · 1 of 1 positions shown · non-contrast
Comparison: November 23, 2020.

CLINICAL DATA: Weakness.

EXAM:
PORTABLE CHEST 1 VIEW

[chest ap]
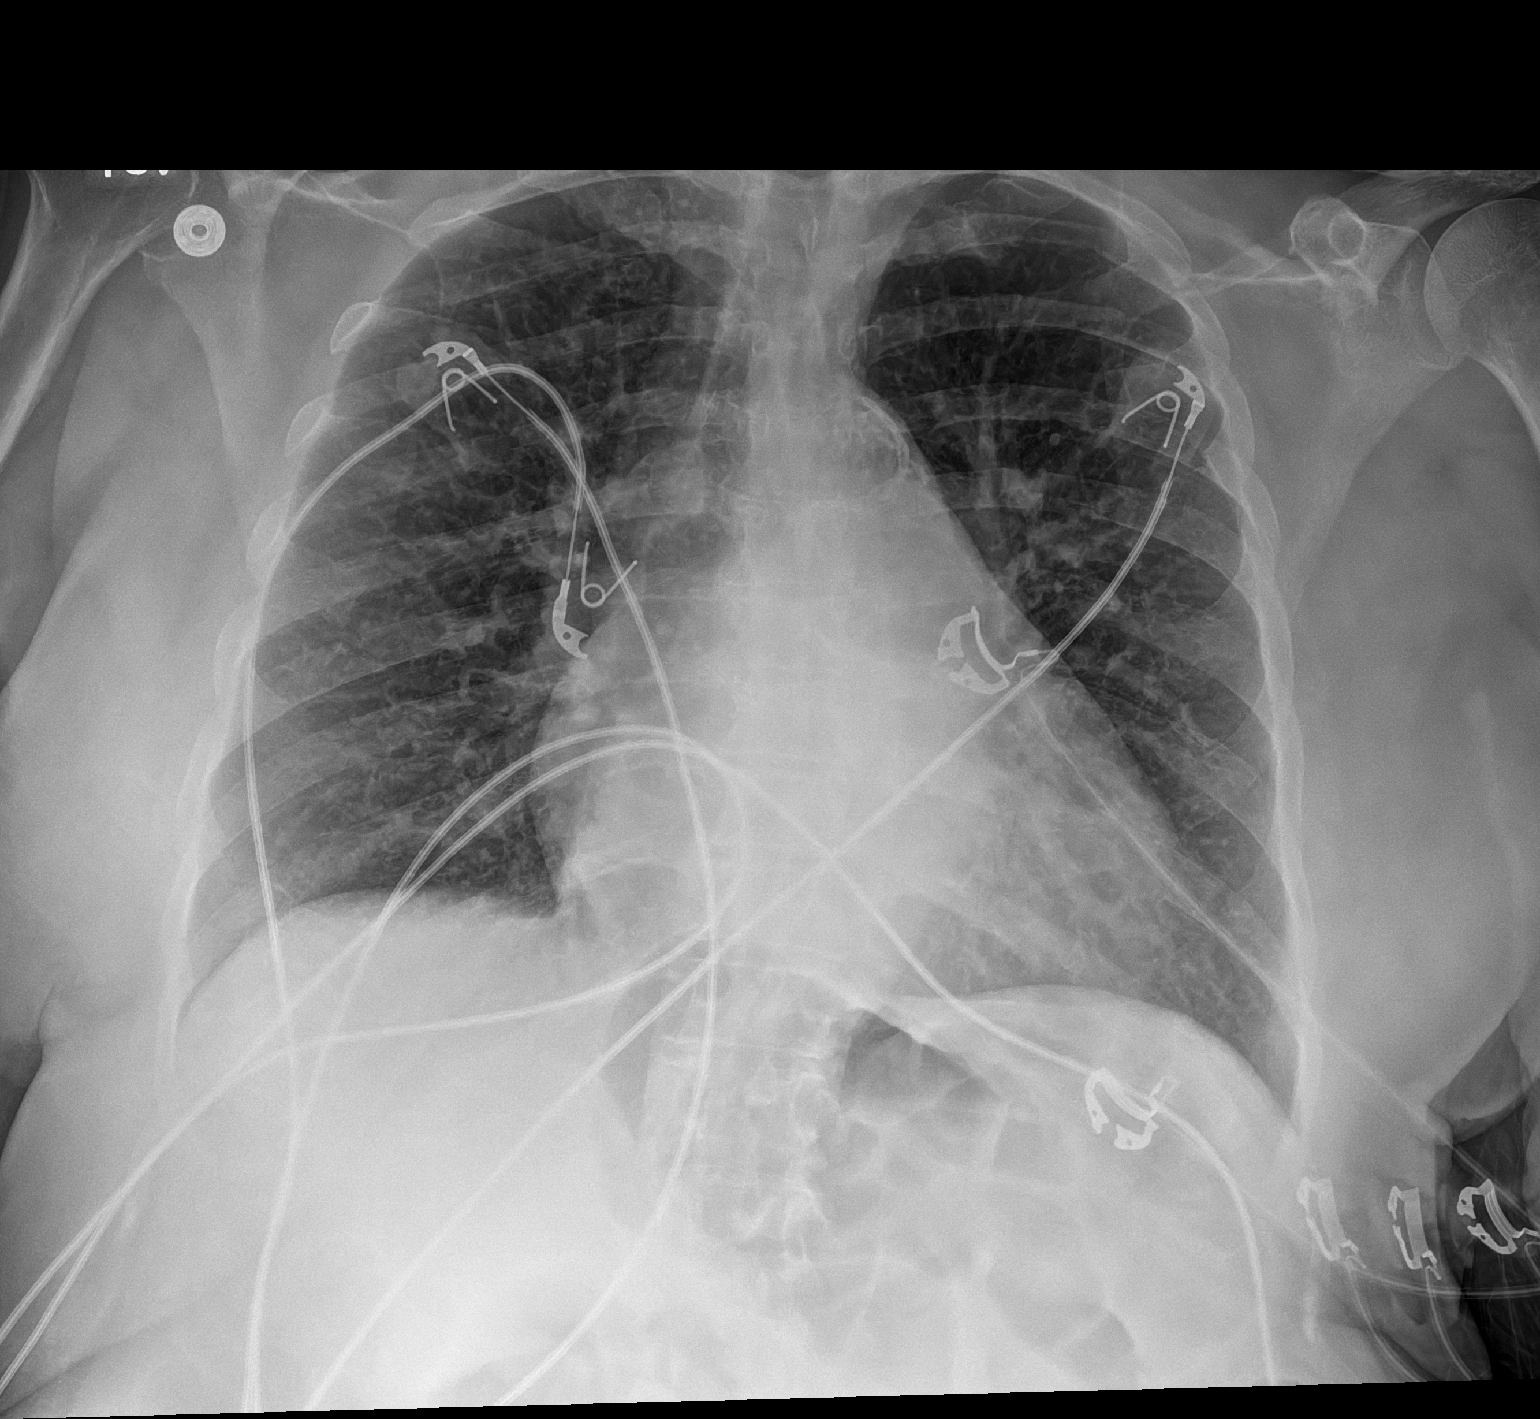

[1 of 1 positions shown; findings below may reference images not displayed]

FINDINGS: Stable cardiomediastinal silhouette. No pneumothorax or pleural
effusion is noted. Both lungs are clear. The visualized skeletal
structures are unremarkable.
IMPRESSION: No active disease.

Aortic Atherosclerosis (9H0V0-I99.9).

## 2022-02-18 IMAGING — MR MR FOOT*R* W/O CM
5 series · 40 of 40 positions shown · non-contrast
Comparison: None.

CLINICAL DATA: Fifth digit pain, question of osteomyelitis of the
fifth digit

EXAM:
MRI OF THE RIGHT FOREFOOT WITHOUT CONTRAST
TECHNIQUE: Multiplanar, multisequence MR imaging of the right was performed. No
intravenous contrast was administered.

[Series 6: T1 · coronal · right · 3.0mm · 0.38mm/px · 13 of 48 slices shown (1 of 2)]
[im 1/48]
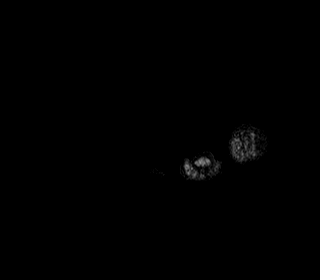
[im 4/48]
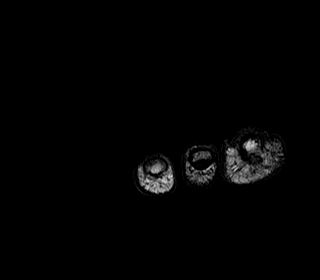
[im 8/48]
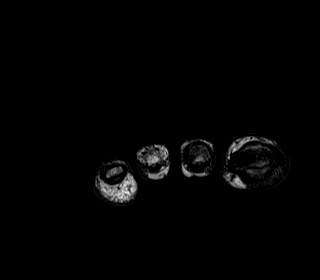
[im 12/48]
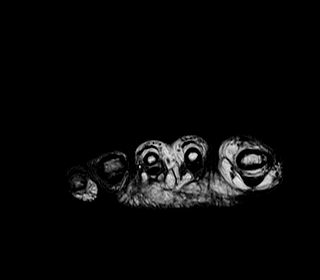
[im 16/48]
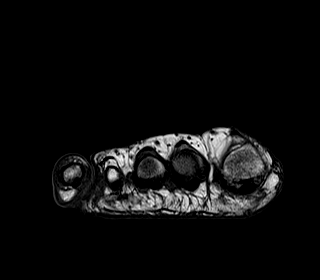
[im 20/48]
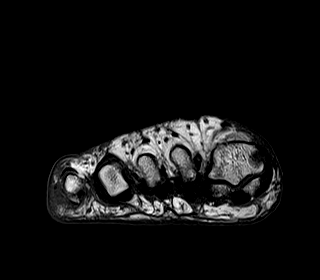
[im 24/48]
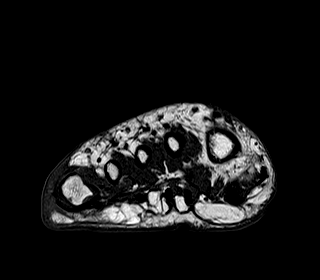
[im 28/48]
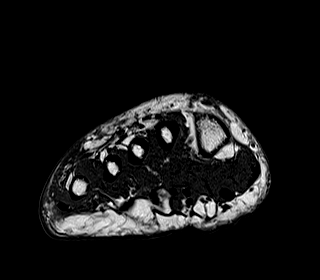
[im 32/48]
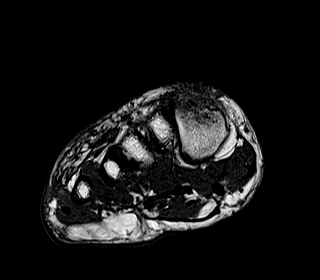
[im 36/48]
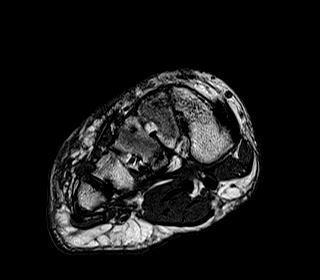
[im 40/48]
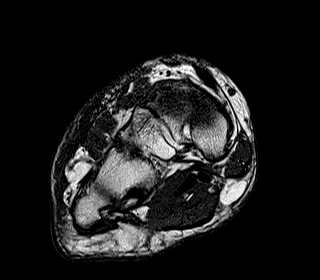
[im 44/48]
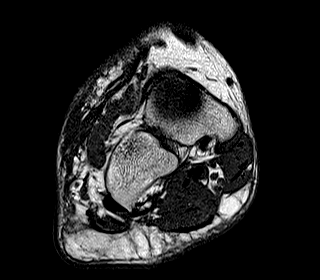
[im 48/48]
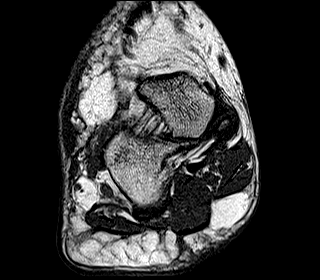

[Series 7: T2 fat-sat · coronal · right · 3.0mm · 0.38mm/px · 12 of 48 slices shown (1 of 2)]
[im 1/48]
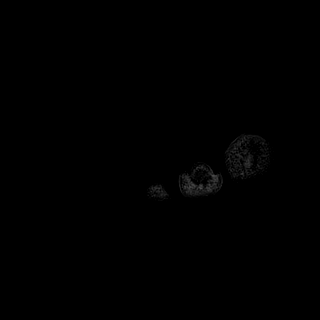
[im 5/48]
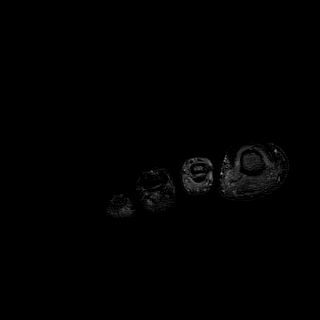
[im 9/48]
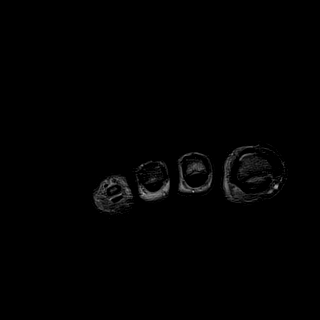
[im 13/48]
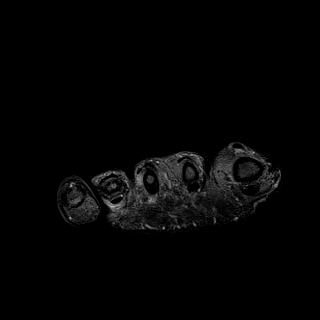
[im 18/48]
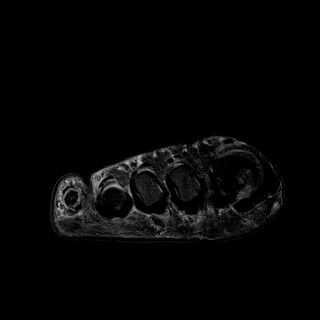
[im 22/48]
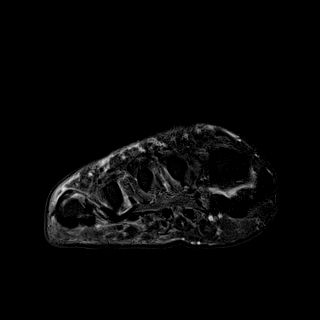
[im 26/48]
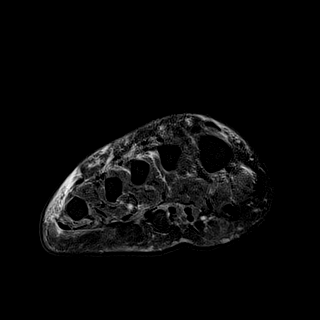
[im 30/48]
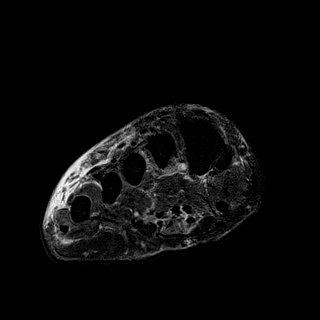
[im 35/48]
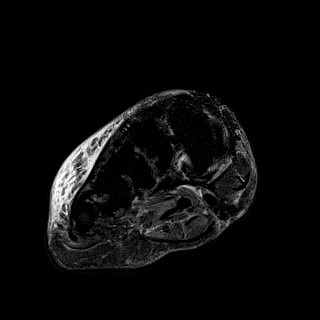
[im 39/48]
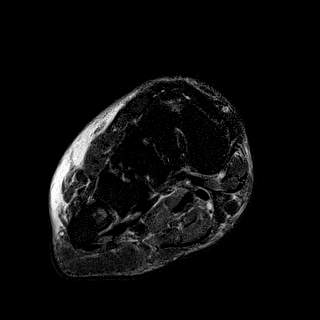
[im 43/48]
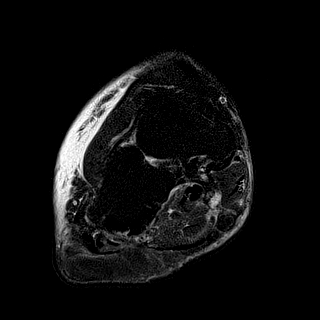
[im 48/48]
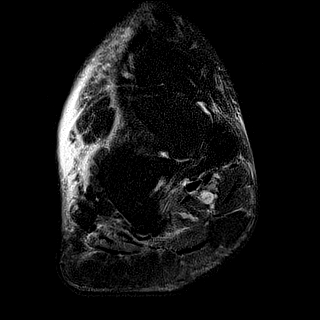

[Series 8: STIR · sagittal · right · 4.0mm · 0.29mm/px · 5 of 21 slices shown]
[im 1/21]
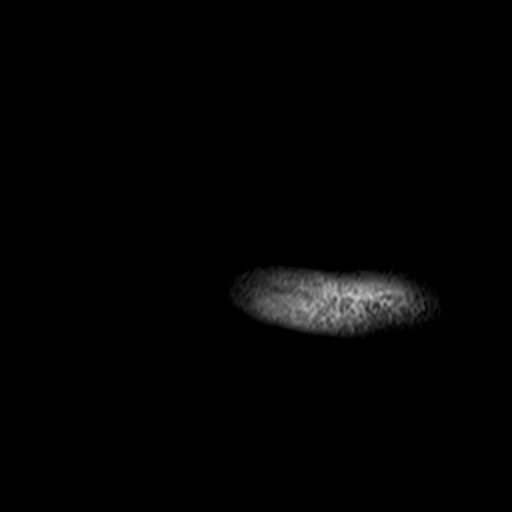
[im 6/21]
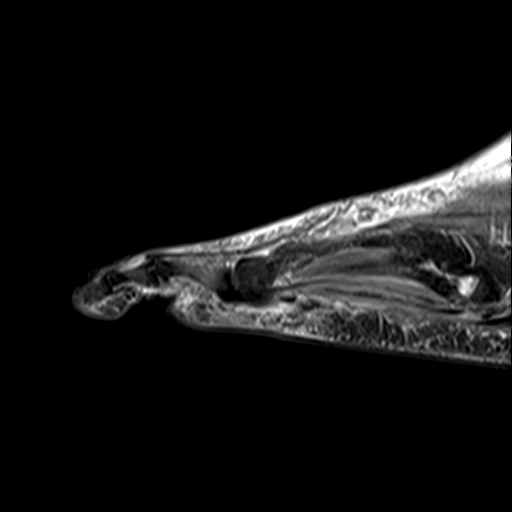
[im 11/21]
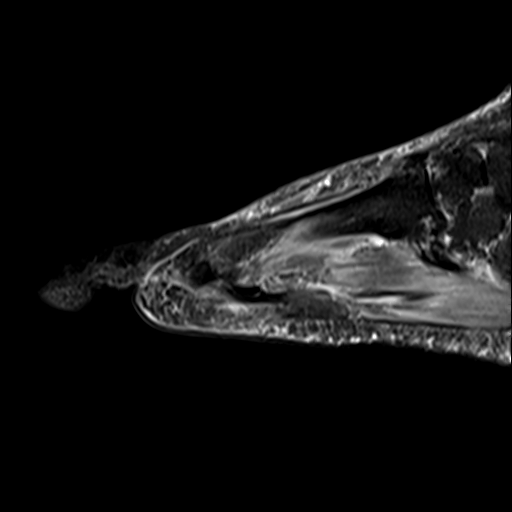
[im 16/21]
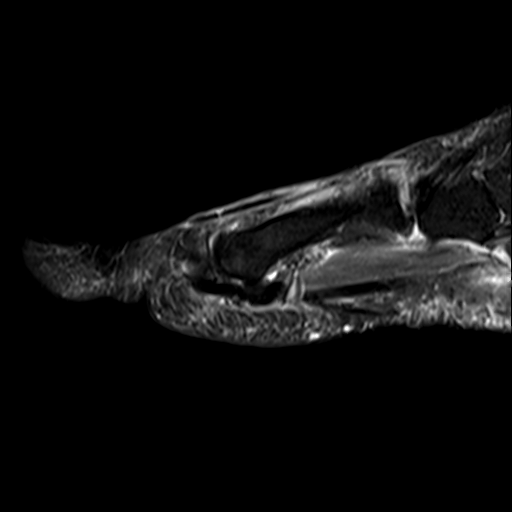
[im 21/21]
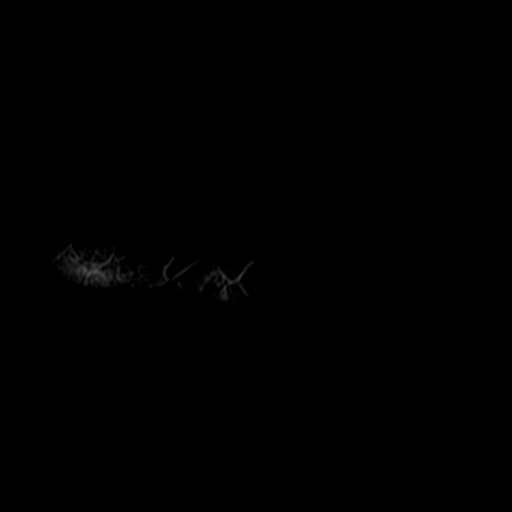

[Series 9: T1 · axial · right · 3.0mm · 0.70mm/px · z∈[-119,-40]mm · 5 of 22 slices shown (2 of 2)]
[im 1/22]
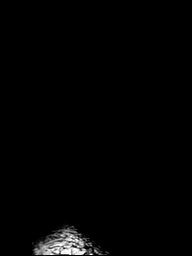
[im 6/22]
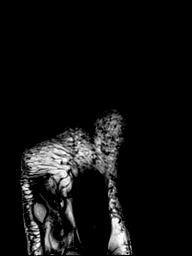
[im 11/22]
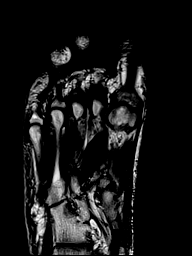
[im 16/22]
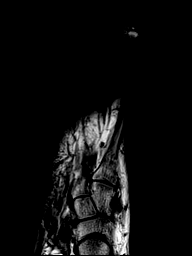
[im 22/22]
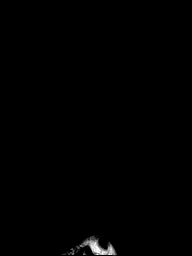

[Series 10: T2 fat-sat · axial · right · 3.0mm · 0.62mm/px · z∈[-118,-39]mm · 5 of 22 slices shown (2 of 2)]
[im 1/22]
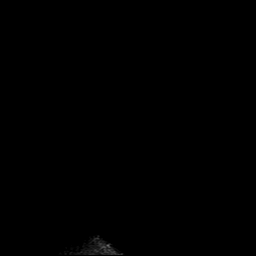
[im 6/22]
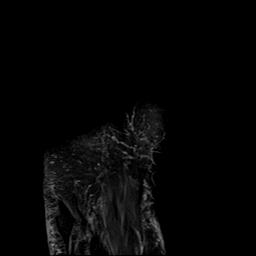
[im 11/22]
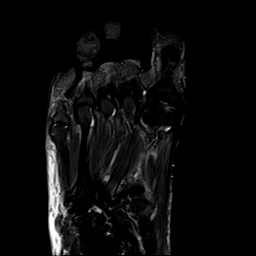
[im 16/22]
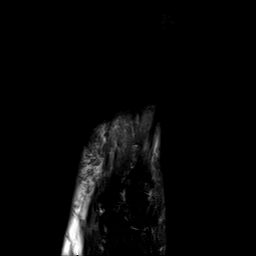
[im 22/22]
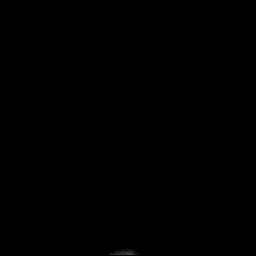

[40 of 40 positions shown; findings below may reference images not displayed]

FINDINGS: Bones/Joint/Cartilage

There is minimally increased T2 hyperintense signal seen within the
distal phalanx of the fifth digit with subtle T1 hypointensity. No
areas of cortical destruction or periosteal reaction are seen. There
is also mildly increased signal seen within the middle fifth
phalanx.

Ligaments

The Lisfranc ligaments are intact.

Muscles and Tendons

The muscles surrounding the forefoot are intact without focal
atrophy or tear. The flexor and extensor tendons are intact.

Soft tissues

Mildly increased soft tissue swelling seen around the dorsum of the
forefoot with focal soft tissue swelling around the distal aspect of
the fifth digit.
IMPRESSION: Findings which may be suggestive of reactive marrow versus early
osteomyelitis involving the fifth distal phalanx. Dorsal
subcutaneous edema.

## 2022-02-19 NOTE — Telephone Encounter (Signed)
Noted  

## 2022-02-19 NOTE — Telephone Encounter (Signed)
Spoke with patient. She reports the pharmacy has actually been sending her the 100 mcg/ml rather than the 50 mcg/ml. She is injecting 24ml BID as prescribed. Will go ahead and refill this Rx.   [name], no need to reach out to pharmacy.

## 2022-02-20 ENCOUNTER — Encounter (HOSPITAL_COMMUNITY): Payer: Self-pay

## 2022-02-20 ENCOUNTER — Encounter (HOSPITAL_COMMUNITY)
Admission: RE | Admit: 2022-02-20 | Discharge: 2022-02-20 | Disposition: A | Discharge status: Home / Self Care | Payer: Medicare HMO | Source: Ambulatory Visit | Attending: Nephrology | Admitting: Nephrology

## 2022-02-20 DIAGNOSIS — N185 Chronic kidney disease, stage 5: Secondary | ICD-10-CM | POA: Diagnosis not present

## 2022-02-20 LAB — PROTEIN / CREATININE RATIO, URINE
Creatinine, Urine: 27.98 mg/dL
Protein Creatinine Ratio: 0.25 mg/mg{Cre} — ABNORMAL HIGH (ref 0.00–0.15)
Total Protein, Urine: 7 mg/dL

## 2022-02-20 LAB — CBC WITH DIFFERENTIAL/PLATELET
Abs Immature Granulocytes: 0.01 10*3/uL (ref 0.00–0.07)
Basophils Absolute: 0 10*3/uL (ref 0.0–0.1)
Basophils Relative: 0 %
Eosinophils Absolute: 0.3 10*3/uL (ref 0.0–0.5)
Eosinophils Relative: 5 %
HCT: 35 % — ABNORMAL LOW (ref 36.0–46.0)
Hemoglobin: 10.5 g/dL — ABNORMAL LOW (ref 12.0–15.0)
Immature Granulocytes: 0 %
Lymphocytes Relative: 49 %
Lymphs Abs: 2.7 10*3/uL (ref 0.7–4.0)
MCH: 29.4 pg (ref 26.0–34.0)
MCHC: 30 g/dL (ref 30.0–36.0)
MCV: 98 fL (ref 80.0–100.0)
Monocytes Absolute: 0.5 10*3/uL (ref 0.1–1.0)
Monocytes Relative: 8 %
Neutro Abs: 2.1 10*3/uL (ref 1.7–7.7)
Neutrophils Relative %: 38 %
Platelets: 307 10*3/uL (ref 150–400)
RBC: 3.57 MIL/uL — ABNORMAL LOW (ref 3.87–5.11)
RDW: 13.5 % (ref 11.5–15.5)
WBC: 5.6 10*3/uL (ref 4.0–10.5)
nRBC: 0 % (ref 0.0–0.2)

## 2022-02-20 LAB — RENAL FUNCTION PANEL
Albumin: 4.5 g/dL (ref 3.5–5.0)
Anion gap: 11 (ref 5–15)
BUN: 25 mg/dL — ABNORMAL HIGH (ref 8–23)
CO2: 27 mmol/L (ref 22–32)
Calcium: 9.1 mg/dL (ref 8.9–10.3)
Chloride: 99 mmol/L (ref 98–111)
Creatinine, Ser: 2.02 mg/dL — ABNORMAL HIGH (ref 0.44–1.00)
GFR, Estimated: 25 mL/min — ABNORMAL LOW (ref >60)
Glucose, Bld: 101 mg/dL — ABNORMAL HIGH (ref 70–99)
Phosphorus: 3.3 mg/dL (ref 2.5–4.6)
Potassium: 3.6 mmol/L (ref 3.5–5.1)
Sodium: 137 mmol/L (ref 135–145)

## 2022-02-20 LAB — POCT HEMOGLOBIN-HEMACUE: Hemoglobin: 10.1 g/dL — ABNORMAL LOW (ref 12.0–15.0)

## 2022-02-20 LAB — IRON AND TIBC
Iron: 334 ug/dL — ABNORMAL HIGH (ref 28–170)
Saturation Ratios: 83 % — ABNORMAL HIGH (ref 10.4–31.8)
TIBC: 403 ug/dL (ref 250–450)
UIBC: 69 ug/dL

## 2022-02-20 MED ORDER — EPOETIN ALFA-EPBX 3000 UNIT/ML IJ SOLN: 3000.0000 [IU] | Freq: Once | INTRAMUSCULAR | Status: DC

## 2022-02-21 LAB — PTH, INTACT AND CALCIUM
Calcium, Total (PTH): 9 mg/dL (ref 8.7–10.3)
PTH: 97 pg/mL — ABNORMAL HIGH (ref 15–65)

## 2022-03-06 ENCOUNTER — Encounter (HOSPITAL_COMMUNITY): Payer: Self-pay

## 2022-03-06 ENCOUNTER — Encounter (HOSPITAL_COMMUNITY)
Admission: RE | Admit: 2022-03-06 | Discharge: 2022-03-06 | Disposition: A | Discharge status: Home / Self Care | Payer: Medicare HMO | Source: Ambulatory Visit | Attending: Nephrology | Admitting: Nephrology

## 2022-03-06 DIAGNOSIS — D631 Anemia in chronic kidney disease: Secondary | ICD-10-CM | POA: Insufficient documentation

## 2022-03-06 DIAGNOSIS — N184 Chronic kidney disease, stage 4 (severe): Secondary | ICD-10-CM | POA: Diagnosis present

## 2022-03-06 LAB — POCT HEMOGLOBIN-HEMACUE: Hemoglobin: 10.3 g/dL — ABNORMAL LOW (ref 12.0–15.0)

## 2022-03-06 MED ORDER — EPOETIN ALFA-EPBX 3000 UNIT/ML IJ SOLN: 3000.0000 [IU] | Freq: Once | INTRAMUSCULAR | Status: DC

## 2022-03-14 ENCOUNTER — Other Ambulatory Visit: Payer: Self-pay | Admitting: Internal Medicine

## 2022-03-14 ENCOUNTER — Other Ambulatory Visit: Payer: Self-pay | Admitting: Gastroenterology

## 2022-03-14 ENCOUNTER — Other Ambulatory Visit: Payer: Self-pay | Admitting: Cardiology

## 2022-03-14 DIAGNOSIS — K625 Hemorrhage of anus and rectum: Secondary | ICD-10-CM

## 2022-03-14 NOTE — Telephone Encounter (Signed)
Prescription refill request for Eliquis received. ?Indication: Afib  ?Last office visit:06/13/21 Lovena Le)  ?Scr: 2.02 (02/20/22) ?Age: 76 ?Weight: 81.1kg ? ?Appropriate dose and refill sent to requested pharmacy.  ?

## 2022-03-14 NOTE — Telephone Encounter (Signed)
Last ov visit 12/06/21 ?

## 2022-03-20 ENCOUNTER — Encounter (HOSPITAL_COMMUNITY)
Admission: RE | Admit: 2022-03-20 | Discharge: 2022-03-20 | Disposition: A | Discharge status: Home / Self Care | Payer: Medicare HMO | Source: Ambulatory Visit | Attending: Nephrology | Admitting: Nephrology

## 2022-03-20 ENCOUNTER — Encounter (HOSPITAL_COMMUNITY): Payer: Self-pay

## 2022-03-20 DIAGNOSIS — N184 Chronic kidney disease, stage 4 (severe): Secondary | ICD-10-CM | POA: Diagnosis not present

## 2022-03-20 LAB — CBC WITH DIFFERENTIAL/PLATELET
Abs Immature Granulocytes: 0 10*3/uL (ref 0.00–0.07)
Basophils Absolute: 0 10*3/uL (ref 0.0–0.1)
Basophils Relative: 0 %
Eosinophils Absolute: 0.3 10*3/uL (ref 0.0–0.5)
Eosinophils Relative: 6 %
HCT: 34.9 % — ABNORMAL LOW (ref 36.0–46.0)
Hemoglobin: 10.6 g/dL — ABNORMAL LOW (ref 12.0–15.0)
Immature Granulocytes: 0 %
Lymphocytes Relative: 33 %
Lymphs Abs: 1.4 10*3/uL (ref 0.7–4.0)
MCH: 28.5 pg (ref 26.0–34.0)
MCHC: 30.4 g/dL (ref 30.0–36.0)
MCV: 93.8 fL (ref 80.0–100.0)
Monocytes Absolute: 0.4 10*3/uL (ref 0.1–1.0)
Monocytes Relative: 9 %
Neutro Abs: 2.2 10*3/uL (ref 1.7–7.7)
Neutrophils Relative %: 52 %
Platelets: 267 10*3/uL (ref 150–400)
RBC: 3.72 MIL/uL — ABNORMAL LOW (ref 3.87–5.11)
RDW: 13 % (ref 11.5–15.5)
WBC: 4.3 10*3/uL (ref 4.0–10.5)
nRBC: 0 % (ref 0.0–0.2)

## 2022-03-20 LAB — RENAL FUNCTION PANEL
Albumin: 4.1 g/dL (ref 3.5–5.0)
Anion gap: 5 (ref 5–15)
BUN: 27 mg/dL — ABNORMAL HIGH (ref 8–23)
CO2: 25 mmol/L (ref 22–32)
Calcium: 8.6 mg/dL — ABNORMAL LOW (ref 8.9–10.3)
Chloride: 106 mmol/L (ref 98–111)
Creatinine, Ser: 2.23 mg/dL — ABNORMAL HIGH (ref 0.44–1.00)
GFR, Estimated: 22 mL/min — ABNORMAL LOW (ref >60)
Glucose, Bld: 115 mg/dL — ABNORMAL HIGH (ref 70–99)
Phosphorus: 3.2 mg/dL (ref 2.5–4.6)
Potassium: 4.5 mmol/L (ref 3.5–5.1)
Sodium: 136 mmol/L (ref 135–145)

## 2022-03-20 LAB — PROTEIN / CREATININE RATIO, URINE
Creatinine, Urine: 58.44 mg/dL
Protein Creatinine Ratio: 0.17 mg/mg{Cre} — ABNORMAL HIGH (ref 0.00–0.15)
Total Protein, Urine: 10 mg/dL

## 2022-03-20 LAB — IRON AND TIBC
Iron: 82 ug/dL (ref 28–170)
Saturation Ratios: 22 % (ref 10.4–31.8)
TIBC: 375 ug/dL (ref 250–450)
UIBC: 293 ug/dL

## 2022-03-20 LAB — FERRITIN: Ferritin: 21 ng/mL (ref 11–307)

## 2022-03-20 LAB — POCT HEMOGLOBIN-HEMACUE: Hemoglobin: 10.8 g/dL — ABNORMAL LOW (ref 12.0–15.0)

## 2022-03-20 MED ORDER — EPOETIN ALFA-EPBX 3000 UNIT/ML IJ SOLN: 3000.0000 [IU] | Freq: Once | INTRAMUSCULAR | Status: DC

## 2022-03-29 ENCOUNTER — Other Ambulatory Visit: Payer: Self-pay | Admitting: Cardiology

## 2022-03-29 ENCOUNTER — Other Ambulatory Visit: Payer: Self-pay | Admitting: Internal Medicine

## 2022-04-03 ENCOUNTER — Encounter (HOSPITAL_COMMUNITY)
Admission: RE | Admit: 2022-04-03 | Discharge: 2022-04-03 | Disposition: A | Discharge status: Home / Self Care | Payer: Medicare HMO | Source: Ambulatory Visit | Attending: Nephrology | Admitting: Nephrology

## 2022-04-03 DIAGNOSIS — N184 Chronic kidney disease, stage 4 (severe): Secondary | ICD-10-CM | POA: Diagnosis not present

## 2022-04-03 DIAGNOSIS — D631 Anemia in chronic kidney disease: Secondary | ICD-10-CM | POA: Diagnosis present

## 2022-04-03 LAB — POCT HEMOGLOBIN-HEMACUE: Hemoglobin: 9.7 g/dL — ABNORMAL LOW (ref 12.0–15.0)

## 2022-04-03 MED ORDER — EPOETIN ALFA-EPBX 3000 UNIT/ML IJ SOLN: 3000.0000 [IU] | Freq: Once | INTRAMUSCULAR | Status: DC

## 2022-04-03 MED ORDER — EPOETIN ALFA-EPBX 3000 UNIT/ML IJ SOLN
INTRAMUSCULAR | Status: AC
  Filled 2022-04-03: qty 1

## 2022-04-12 ENCOUNTER — Ambulatory Visit: Payer: Medicare HMO | Admitting: Internal Medicine

## 2022-04-12 ENCOUNTER — Encounter: Payer: Self-pay | Admitting: Internal Medicine

## 2022-04-12 VITALS — BP 162/70 | HR 63 | Ht 62.0 in | Wt 180.4 lb

## 2022-04-12 DIAGNOSIS — I480 Paroxysmal atrial fibrillation: Secondary | ICD-10-CM | POA: Diagnosis not present

## 2022-04-12 NOTE — Patient Instructions (Signed)
Medication Instructions:  Your physician recommends that you continue on your current medications as directed. Please refer to the Current Medication list given to you today.  *If you need a refill on your cardiac medications before your next appointment, please call your pharmacy*   Lab Work: NONE   If you have labs (blood work) drawn today and your tests are completely normal, you will receive your results only by: MyChart Message (if you have MyChart) OR A paper copy in the mail If you have any lab test that is abnormal or we need to change your treatment, we will call you to review the results.   Testing/Procedures: NONE    Follow-Up: At [facility], you and your health needs are our priority.  As part of our continuing mission to provide you with exceptional heart care, we have created designated Provider Care Teams.  These Care Teams include your primary Cardiologist (physician) and Advanced Practice Providers (APPs -  Physician Assistants and Nurse Practitioners) who all work together to provide you with the care you need, when you need it.  We recommend signing up for the patient portal called "MyChart".  Sign up information is provided on this After Visit Summary.  MyChart is used to connect with patients for Virtual Visits (Telemedicine).  Patients are able to view lab/test results, encounter notes, upcoming appointments, etc.  Non-urgent messages can be sent to your provider as well.   To learn more about what you can do with MyChart, go to [email or web address].    Your next appointment:   1 year(s)  The format for your next appointment:   In Person  Provider:   Gregg Taylor, MD    Other Instructions Thank you for choosing [facility]!    Important Information About Sugar       

## 2022-04-12 NOTE — Progress Notes (Signed)
? ? ? ? ?HPI ?[name] [name] returns today for followup of her atrial fib and HTN. She is a pleasant 76 yo woman with PAF, who I saw a couple of months ago. She was started on flecainide 75 mg twice daily but reduced to 50 mg twice daily. Her symptoms of atrial fib have essentially resolved. She denies chest pain or sob. She saw Dr.Lambert in consultation about a Watchman and he thought she was not a good candidate. She has gotten back on eliquis but has not had any bleeding. She denies any symptomatic atrial fib. She notes that her arm is sore for a couple of weeks. No relation to exertion. ?Allergies  ?Allergen Reactions  ? Amlodipine   ?  Felt she retained fluid in her chest   ? Amoxicillin Hives  ?  Did it involve swelling of the face/tongue/throat, SOB, or low BP? No ?Did it involve sudden or severe rash/hives, skin peeling, or any reaction on the inside of your mouth or nose? No ?Did you need to seek medical attention at a hospital or doctor's office? No ?When did it last happen?      10 + years ?If all above answers are "NO", may proceed with cephalosporin use. ?  ? Doxycycline   ?  " sick and weak"  ? Acetaminophen-Codeine Hives and Rash  ? Allopurinol Hives and Rash  ? Tramadol Hives and Rash  ? ? ? ?Current Outpatient Medications  ?Medication Sig Dispense Refill  ? acetaminophen (TYLENOL) 500 MG tablet Take 1,000 mg by mouth every 6 (six) hours as needed for moderate pain.    ? albuterol (VENTOLIN HFA) 108 (90 Base) MCG/ACT inhaler Inhale 1-2 puffs into the lungs every 6 (six) hours as needed for wheezing or shortness of breath.     ? calcium carbonate (OS-CAL - DOSED IN MG OF ELEMENTAL CALCIUM) 1250 (500 Ca) MG tablet Take 1 tablet by mouth. Takes MWF    ? cetirizine (ZYRTEC) 10 MG tablet Take 10 mg by mouth daily as needed for allergies.    ? Cholecalciferol (VITAMIN D-3) 25 MCG (1000 UT) CAPS Take 1,000 Units by mouth daily.    ? colchicine 0.6 MG tablet Take 1 tablet by mouth daily as needed (gout flare).     ? cyclobenzaprine (FLEXERIL) 5 MG tablet Take 5 mg by mouth 3 (three) times daily as needed for muscle spasms.    ? diclofenac Sodium (VOLTAREN) 1 % GEL Apply 1 application topically 4 (four) times daily as needed (pain).    ? ELIQUIS 5 MG TABS tablet TAKE 1 TABLET BY MOUTH TWICE A DAY 180 tablet 1  ? epoetin alfa-epbx (RETACRIT) 3000 UNIT/ML injection Inject 3,000 Units into the skin every 14 (fourteen) days.    ? ferrous sulfate 325 (65 FE) MG tablet Take 325 mg by mouth daily with breakfast.    ? flecainide (TAMBOCOR) 50 MG tablet TAKE 1 TABLET BY MOUTH TWICE A DAY 60 tablet 1  ? fluticasone (FLONASE) 50 MCG/ACT nasal spray Place 1 spray into both nostrils daily as needed for allergies or rhinitis.    ? Fluticasone-Salmeterol (ADVAIR) 500-50 MCG/DOSE AEPB Inhale 1 puff into the lungs every 12 (twelve) hours.    ? furosemide (LASIX) 20 MG tablet     ? furosemide (LASIX) 40 MG tablet Take 1 tablet (40 mg total) by mouth daily. 90 tablet 0  ? gabapentin (NEURONTIN) 300 MG capsule Take 300 mg by mouth 3 (three) times daily as needed (nerve pain).    ?  hydrocortisone (ANUSOL-HC) 2.5 % rectal cream Place 1 application rectally 2 (two) times daily. 30 g 1  ? losartan (COZAAR) 50 MG tablet Take 1 tablet (50 mg total) by mouth daily. 90 tablet 3  ? lubiprostone (AMITIZA) 24 MCG capsule Take 1 capsule (24 mcg total) by mouth in the morning and at bedtime. 180 capsule 3  ? octreotide (SANDOSTATIN) 100 MCG/ML SOLN injection INJECT 1 ML (1 VIAL) UNDER THE SKIN 2 TIMES A DAY 60 mL 2  ? pantoprazole (PROTONIX) 40 MG tablet TAKE ONE TABLET BY MOUTH TWICE DAILY BEFORE A MEAL 60 tablet 1  ? Polyethylene Glycol 3350 (CLEARLAX PO) Take by mouth as needed. 17 grams    ? potassium chloride SA (KLOR-CON) 20 MEQ tablet Take 20 mEq by mouth in the morning, at noon, and at bedtime.    ? rosuvastatin (CRESTOR) 10 MG tablet TAKE 1 TABLET BY MOUTH EVERY DAY 30 tablet 0  ? vitamin C (ASCORBIC ACID) 250 MG tablet Take 500 mg by mouth daily.     ? ?No current facility-administered medications for this visit.  ? ? ? ?Past Medical History:  ?Diagnosis Date  ? Anemia   ? Chronic kidney disease   ? Constipation 07/17/2017  ? GERD (gastroesophageal reflux disease)   ? Gout   ? Grade II diastolic dysfunction 29/04/1883  ? Hyperlipidemia   ? Hypertension   ? Hypokalemia   ? OSA (obstructive sleep apnea) 08/09/2020  ? PAF (paroxysmal atrial fibrillation) ([place])   ? PAF (paroxysmal atrial fibrillation) ([place])   ? PUD (peptic ulcer disease)   ? Syncope   ? Neurocardiogenic  ? ? ?ROS: ? ? All systems reviewed and negative except as noted in the HPI. ? ? ?Past Surgical History:  ?Procedure Laterality Date  ? ABDOMINAL AORTOGRAM W/LOWER EXTREMITY Right 12/13/2020  ? Procedure: ABDOMINAL AORTOGRAM W/LOWER EXTREMITY;  Surgeon: Serafina Mitchell, MD;  Location: [place] [facility];  Service: Cardiovascular;  Laterality: Right;  ? ABDOMINAL HYSTERECTOMY    ? AMPUTATION Right 12/14/2020  ? Procedure: Right fifth toe amputation;  Surgeon: Rosetta Posner, MD;  Location: [place];  Service: Vascular;  Laterality: Right;  ? CATARACT EXTRACTION Bilateral   ? COLONOSCOPY N/A 10/07/2014  ? Dr. Oneida Alar: redundant left colon, moderate sized external hemorrhoids   ? COLONOSCOPY N/A 03/11/2020  ? Surgeon: Danie Binder, MD;  9 polyps, majority of which were tubular adenomas, nodular mucosa in the anus s/p biopsy which was benign.  ? ENTEROSCOPY N/A 11/27/2020  ? 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  ? ESOPHAGOGASTRODUODENOSCOPY (EGD) WITH PROPOFOL N/A 11/25/2020  ?  Surgeon: Daneil Dolin, MD; Normal, s/p capsule deployment  ? GIVENS CAPSULE STUDY N/A 11/25/2020  ?  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   ? HOT HEMOSTASIS  11/27/2020  ? Procedure: HOT HEMOSTASIS (ARGON PLASMA  COAGULATION/BICAP);  Surgeon: Montez Morita, Quillian Quince, MD;  [facility]: [facility];  Service: Gastroenterology;;  ? LIPOMA RESECTION    ? PERIPHERAL VASCULAR INTERVENTION Right 12/13/2020  ? Procedure: PERIPHERAL VASCULAR INTERVENTION;  Surgeon: Serafina Mitchell, MD;  Location: [place] [facility];  Service: Cardiovascular;  Laterality: Right;  SFA/PT/AT  ? POLYPECTOMY  03/11/2020  ? Procedure: POLYPECTOMY;  Surgeon: Danie Binder, MD;  Location: [facility];  Service: Endoscopy;;  cecal, transverse,  descending, sigmoid  ? TEE WITHOUT CARDIOVERSION N/A 05/10/2021  ? Procedure: TRANSESOPHAGEAL ECHOCARDIOGRAM (TEE) WATCHMEN EVALUATION;  Surgeon: Josue Hector, MD;  Location[facility];  Service: Cardiovascular;  Laterality: N/A;  ? ? ? ?Family History  ?Problem Relation Age of Onset  ? Stroke Mother   ? Dementia Father   ? Cancer - Other Sister   ? Atrial fibrillation Sister   ? Heart failure Sister   ? Colon cancer Maternal Grandmother   ?     diagnosed in her 67s  ? ? ? ?Social History  ? ?Socioeconomic History  ? Marital status: Single  ?  Spouse name: Not on file  ? Number of children: Not on file  ? Years of education: Not on file  ? Highest education level: Not on file  ?Occupational History  ? Occupation: Retired  ?Tobacco Use  ? Smoking status: Former  ?  Packs/day: 1.00  ?  Years: 20.00  ?  Pack years: 20.00  ?  Types: Cigarettes  ?  Start date: 03/16/1964  ?  Quit date: 10/07/1997  ?  Years since quitting: 24.5  ? Smokeless tobacco: Never  ?Vaping Use  ? Vaping Use: Never used  ?Substance and Sexual Activity  ? Alcohol use: No  ?  Alcohol/week: 0.0 standard drinks  ? Drug use: No  ? Sexual activity: Yes  ?  Partners: Male  ?Other Topics Concern  ? Not on file  ?Social History Narrative  ? Not on file  ? ?Social Determinants of Health  ? ?Financial Resource Strain: Not on file  ?Food Insecurity: Not on file  ?Transportation Needs: Not on file  ?Physical Activity: Not on file  ?Stress: Not on file   ?Social Connections: Not on file  ?Intimate Partner Violence: Not on file  ? ? ? ?BP (!) 162/70   Pulse 63   Ht '5\' 2"'$  (1.575 m)   Wt 180 lb 6.4 oz (81.8 kg)   SpO2 99%   BMI 33.00 kg/m?  ? ?Physical Exam: ? ?We

## 2022-04-16 ENCOUNTER — Other Ambulatory Visit: Payer: Self-pay | Admitting: Family Medicine

## 2022-04-17 ENCOUNTER — Encounter (HOSPITAL_COMMUNITY)
Admission: RE | Admit: 2022-04-17 | Discharge: 2022-04-17 | Disposition: A | Discharge status: Home / Self Care | Payer: Medicare HMO | Source: Ambulatory Visit | Attending: Nephrology | Admitting: Nephrology

## 2022-04-17 ENCOUNTER — Encounter (HOSPITAL_COMMUNITY): Payer: Self-pay

## 2022-04-17 DIAGNOSIS — N184 Chronic kidney disease, stage 4 (severe): Secondary | ICD-10-CM | POA: Diagnosis not present

## 2022-04-17 LAB — POCT HEMOGLOBIN-HEMACUE: Hemoglobin: 9 g/dL — ABNORMAL LOW (ref 12.0–15.0)

## 2022-04-17 MED ORDER — EPOETIN ALFA-EPBX 3000 UNIT/ML IJ SOLN
3000.0000 [IU] | Freq: Once | INTRAMUSCULAR | Status: AC
  Administered 2022-04-17: 3000 [IU] via SUBCUTANEOUS
  Filled 2022-04-17: qty 1

## 2022-04-20 ENCOUNTER — Telehealth: Payer: Self-pay

## 2022-04-20 NOTE — Telephone Encounter (Signed)
Returned the pt's call from yesterday and she advised mr that she has not received her Amitiza yet. This was sent in Dec to her pharmacy with refills. I advised her to call her pharmacy and have them fax [place] a PA if it was needed, but other than that Dr. Abbey Chatters sent it to the right pharmacy. Will check again before I leave today. ?

## 2022-04-23 NOTE — Telephone Encounter (Signed)
Pt's PA approved for Lubiprostone cap from 12/24/2021 through 12/23/2022. Pt notified by vm. Will wait to see if she wants [facility] mail order [facility] [place] [facility]. [facility] [place] ?

## 2022-04-23 NOTE — Telephone Encounter (Signed)
PA done for Lubiprostone (Amitiza 24 mcg).  ?Tried/failed: Linzess, Miralax, Mag Citrate and Clearlax. Dx: chronic constipation. Waiting on a response from Cover My Meds. ?

## 2022-04-30 ENCOUNTER — Telehealth: Payer: Self-pay | Admitting: Family Medicine

## 2022-04-30 NOTE — Telephone Encounter (Signed)
FYI NOT A PATIENT AT THIS OFFICE ? ? ? ?Patient called stating Dr. Moshe Cipro seen her in the hospital/ED in 2021 & started her on Pantoprazole & rosuvastin. States she has never discussed the medications with her PCP. States it has always been filled by Dr. Moshe Cipro.  ?States phar has been trying to contact us.  ? ? ?Please advise on what to do? ? ?Current phar [facility] mail order  ?Ph [phone] ?

## 2022-05-01 ENCOUNTER — Encounter (HOSPITAL_COMMUNITY): Payer: Self-pay

## 2022-05-01 ENCOUNTER — Encounter (HOSPITAL_COMMUNITY)
Admission: RE | Admit: 2022-05-01 | Discharge: 2022-05-01 | Disposition: A | Discharge status: Home / Self Care | Payer: Medicare HMO | Source: Ambulatory Visit | Attending: Nephrology | Admitting: Nephrology

## 2022-05-01 DIAGNOSIS — N184 Chronic kidney disease, stage 4 (severe): Secondary | ICD-10-CM | POA: Diagnosis present

## 2022-05-01 DIAGNOSIS — D631 Anemia in chronic kidney disease: Secondary | ICD-10-CM | POA: Diagnosis not present

## 2022-05-01 LAB — POCT HEMOGLOBIN-HEMACUE: Hemoglobin: 7.9 g/dL — ABNORMAL LOW (ref 12.0–15.0)

## 2022-05-01 MED ORDER — EPOETIN ALFA-EPBX 3000 UNIT/ML IJ SOLN
3000.0000 [IU] | Freq: Once | INTRAMUSCULAR | Status: AC
  Administered 2022-05-01: 3000 [IU] via SUBCUTANEOUS

## 2022-05-01 MED ORDER — EPOETIN ALFA-EPBX 3000 UNIT/ML IJ SOLN
INTRAMUSCULAR | Status: AC
  Filled 2022-05-01: qty 1

## 2022-05-01 NOTE — Telephone Encounter (Signed)
?  She is not a patient here but have been getting refills from here since 2021. I called [facility] to cancel the refills and they said they are closing and the patient would be transferring her meds to another pharmacy. Everyone needs to be mindful if they receive this refill request from her that she is not a patient at this office and decline the refill  ?

## 2022-05-02 ENCOUNTER — Encounter (HOSPITAL_COMMUNITY): Payer: Self-pay

## 2022-05-02 ENCOUNTER — Other Ambulatory Visit: Payer: Self-pay

## 2022-05-02 ENCOUNTER — Emergency Department (HOSPITAL_COMMUNITY)
Admission: EM | Admit: 2022-05-02 | Discharge: 2022-05-02 | Disposition: A | Discharge status: Home / Self Care | Payer: Medicare HMO | Attending: Emergency Medicine | Admitting: Emergency Medicine

## 2022-05-02 DIAGNOSIS — I4891 Unspecified atrial fibrillation: Secondary | ICD-10-CM | POA: Diagnosis not present

## 2022-05-02 DIAGNOSIS — Z7901 Long term (current) use of anticoagulants: Secondary | ICD-10-CM | POA: Insufficient documentation

## 2022-05-02 DIAGNOSIS — R195 Other fecal abnormalities: Secondary | ICD-10-CM | POA: Diagnosis not present

## 2022-05-02 DIAGNOSIS — D649 Anemia, unspecified: Secondary | ICD-10-CM | POA: Insufficient documentation

## 2022-05-02 DIAGNOSIS — R0602 Shortness of breath: Secondary | ICD-10-CM | POA: Diagnosis present

## 2022-05-02 LAB — PREPARE RBC (CROSSMATCH)

## 2022-05-02 LAB — PROTIME-INR
INR: 1.5 — ABNORMAL HIGH (ref 0.8–1.2)
Prothrombin Time: 18.4 seconds — ABNORMAL HIGH (ref 11.4–15.2)

## 2022-05-02 LAB — CBC WITH DIFFERENTIAL/PLATELET
Abs Immature Granulocytes: 0.01 10*3/uL (ref 0.00–0.07)
Basophils Absolute: 0 10*3/uL (ref 0.0–0.1)
Basophils Relative: 1 %
Eosinophils Absolute: 0.2 10*3/uL (ref 0.0–0.5)
Eosinophils Relative: 3 %
HCT: 28.2 % — ABNORMAL LOW (ref 36.0–46.0)
Hemoglobin: 8.4 g/dL — ABNORMAL LOW (ref 12.0–15.0)
Immature Granulocytes: 0 %
Lymphocytes Relative: 31 %
Lymphs Abs: 2.1 10*3/uL (ref 0.7–4.0)
MCH: 27.7 pg (ref 26.0–34.0)
MCHC: 29.8 g/dL — ABNORMAL LOW (ref 30.0–36.0)
MCV: 93.1 fL (ref 80.0–100.0)
Monocytes Absolute: 0.8 10*3/uL (ref 0.1–1.0)
Monocytes Relative: 11 %
Neutro Abs: 3.7 10*3/uL (ref 1.7–7.7)
Neutrophils Relative %: 54 %
Platelets: 349 10*3/uL (ref 150–400)
RBC: 3.03 MIL/uL — ABNORMAL LOW (ref 3.87–5.11)
RDW: 14.8 % (ref 11.5–15.5)
WBC: 6.8 10*3/uL (ref 4.0–10.5)
nRBC: 0 % (ref 0.0–0.2)

## 2022-05-02 LAB — COMPREHENSIVE METABOLIC PANEL
ALT: 15 U/L (ref 0–44)
AST: 23 U/L (ref 15–41)
Albumin: 4.3 g/dL (ref 3.5–5.0)
Alkaline Phosphatase: 70 U/L (ref 38–126)
Anion gap: 8 (ref 5–15)
BUN: 36 mg/dL — ABNORMAL HIGH (ref 8–23)
CO2: 29 mmol/L (ref 22–32)
Calcium: 9.3 mg/dL (ref 8.9–10.3)
Chloride: 101 mmol/L (ref 98–111)
Creatinine, Ser: 1.95 mg/dL — ABNORMAL HIGH (ref 0.44–1.00)
GFR, Estimated: 26 mL/min — ABNORMAL LOW (ref >60)
Glucose, Bld: 94 mg/dL (ref 70–99)
Potassium: 3.7 mmol/L (ref 3.5–5.1)
Sodium: 138 mmol/L (ref 135–145)
Total Bilirubin: 0.7 mg/dL (ref 0.3–1.2)
Total Protein: 7.6 g/dL (ref 6.5–8.1)

## 2022-05-02 LAB — TROPONIN I (HIGH SENSITIVITY)
Troponin I (High Sensitivity): 11 ng/L (ref <18)
Troponin I (High Sensitivity): 13 ng/L (ref <18)

## 2022-05-02 LAB — POC OCCULT BLOOD, ED: Fecal Occult Bld: POSITIVE — AB

## 2022-05-02 LAB — BRAIN NATRIURETIC PEPTIDE: B Natriuretic Peptide: 77 pg/mL (ref 0.0–100.0)

## 2022-05-02 MED ORDER — SODIUM CHLORIDE 0.9 % IV SOLN
10.0000 mL/h | Freq: Once | INTRAVENOUS | Status: AC
  Administered 2022-05-02: 10 mL/h via INTRAVENOUS

## 2022-05-02 NOTE — ED Notes (Signed)
PRBCs unit requested/ sent for from blood bank ?

## 2022-05-02 NOTE — ED Notes (Signed)
Pt alert, NAD, calm, interactive, resps e/u, speaking in clear complete sentences, VSS. C/o fatigue and DOE. Up to b/r with steady gait. Took lasix PTA. Some sob with ambulation to b/r.   ?

## 2022-05-02 NOTE — ED Provider Notes (Signed)
Patient signed out to me is pending blood transfusion. ? ?Rectal exam has been normal colored but guaiac positive.  Vital signs remained stable hemoglobin very close to her normal levels. ? ?Advised her to hold her Eliquis if she has rectal bleeding at home.  Advise follow-up with her GI doctor or primary care doctor within the next 2 to 3 days.  Advised immediate return for recurrent or heavy bleeding or any additional concerns. ?  ?Luna Fuse, MD ?05/02/22 1631 ? ?

## 2022-05-02 NOTE — ED Provider Notes (Signed)
?[facility] ?Provider Note ? ? ?CSN: [id number] ?Arrival date & time: 05/02/22  0830 ? ?  ? ?History ? ?Chief Complaint  ?Patient presents with  ? Weakness  ? ? ?[name] [name] is a 76 y.o. female.  She is here with a complaint of shortness of breath and fatigue over the last few weeks.  She had some blood work done yesterday by her primary care doctor and found to be anemic.  Has required transfusions in the past.  She said she was at [facility] last year and was told she had leaky veins in her small bowel.  Has not required transfusion in a while.  Has had some bright red blood per rectum which she associates with her hemorrhoids.  No melena.  No other bleeding noticed.  No abdominal pain rectal pain.  No chest pain headaches fevers chills nausea vomiting.  History of atrial fibrillation on anticoagulation. ? ?The history is provided by the patient.  ?Weakness ?Severity:  Moderate ?Onset quality:  Gradual ?Duration:  2 weeks ?Timing:  Intermittent ?Progression:  Unchanged ?Chronicity:  Recurrent ?Relieved by:  Rest ?Worsened by:  Activity ?Ineffective treatments:  None tried ?Associated symptoms: shortness of breath   ?Associated symptoms: no abdominal pain, no chest pain, no dysuria, no fever, no frequency, no nausea and no near-syncope   ? ?  ? ?Home Medications ?Prior to Admission medications   ?Medication Sig Start Date End Date Taking? Authorizing Provider  ?acetaminophen (TYLENOL) 500 MG tablet Take 1,000 mg by mouth every 6 (six) hours as needed for moderate pain.    [provider]  ?albuterol (VENTOLIN HFA) 108 (90 Base) MCG/ACT inhaler Inhale 1-2 puffs into the lungs every 6 (six) hours as needed for wheezing or shortness of breath.  09/23/20   [provider]  ?calcium carbonate (OS-CAL - DOSED IN MG OF ELEMENTAL CALCIUM) 1250 (500 Ca) MG tablet Take 1 tablet by mouth. Takes MWF    [provider]  ?cetirizine (ZYRTEC) 10 MG tablet Take 10 mg by mouth daily as  needed for allergies.    [provider]  ?Cholecalciferol (VITAMIN D-3) 25 MCG (1000 UT) CAPS Take 1,000 Units by mouth daily.    [provider]  ?colchicine 0.6 MG tablet Take 1 tablet by mouth daily as needed (gout flare). 07/31/20   [provider]  ?cyclobenzaprine (FLEXERIL) 5 MG tablet Take 5 mg by mouth 3 (three) times daily as needed for muscle spasms. 06/19/20   [provider]  ?diclofenac Sodium (VOLTAREN) 1 % GEL Apply 1 application topically 4 (four) times daily as needed (pain).    [provider]  ?ELIQUIS 5 MG TABS tablet TAKE 1 TABLET BY MOUTH TWICE A DAY 03/14/22   Evans Lance, MD  ?epoetin alfa-epbx (RETACRIT) 3000 UNIT/ML injection Inject 3,000 Units into the skin every 14 (fourteen) days.    Bhutani, Manpreet S, MD  ?ferrous sulfate 325 (65 FE) MG tablet Take 325 mg by mouth daily with breakfast.    [provider]  ?flecainide (TAMBOCOR) 50 MG tablet TAKE 1 TABLET BY MOUTH TWICE A DAY 03/29/22   Evans Lance, MD  ?fluticasone (FLONASE) 50 MCG/ACT nasal spray Place 1 spray into both nostrils daily as needed for allergies or rhinitis.    [provider]  ?Fluticasone-Salmeterol (ADVAIR) 500-50 MCG/DOSE AEPB Inhale 1 puff into the lungs every 12 (twelve) hours.    [provider]  ?furosemide (LASIX) 20 MG tablet  02/21/22  [provider]  ?furosemide (LASIX) 40 MG tablet Take 1 tablet (40 mg total) by mouth daily. 03/29/22   Evans Lance, MD  ?gabapentin (NEURONTIN) 300 MG capsule Take 300 mg by mouth 3 (three) times daily as needed (nerve pain). 11/21/20   [provider]  ?hydrocortisone (ANUSOL-HC) 2.5 % rectal cream Place 1 application rectally 2 (two) times daily. 03/14/22   Eloise Harman, DO  ?losartan (COZAAR) 50 MG tablet Take 1 tablet (50 mg total) by mouth daily. 11/08/21   Evans Lance, MD  ?lubiprostone (AMITIZA) 24 MCG capsule Take 1 capsule (24 mcg total) by mouth in the morning and  at bedtime. 12/06/21 12/06/22  Eloise Harman, DO  ?octreotide (SANDOSTATIN) 100 MCG/ML SOLN injection INJECT 1 ML (1 VIAL) UNDER THE SKIN 2 TIMES A DAY 02/19/22   Aliene Altes S, PA-C  ?pantoprazole (PROTONIX) 40 MG tablet TAKE ONE TABLET BY MOUTH TWICE DAILY BEFORE A MEAL 04/16/22   Fayrene Helper, MD  ?Polyethylene Glycol 3350 (CLEARLAX PO) Take by mouth as needed. 17 grams    [provider]  ?potassium chloride SA (KLOR-CON) 20 MEQ tablet Take 20 mEq by mouth in the morning, at noon, and at bedtime.    [provider]  ?rosuvastatin (CRESTOR) 10 MG tablet TAKE 1 TABLET BY MOUTH EVERY DAY 08/21/21   Fayrene Helper, MD  ?vitamin C (ASCORBIC ACID) 250 MG tablet Take 500 mg by mouth daily.    [provider]  ?   ? ?Allergies    ?Amlodipine, Amoxicillin, Doxycycline, Acetaminophen-codeine, Allopurinol, and Tramadol   ? ?Review of Systems   ?Review of Systems  ?Constitutional:  Positive for fatigue. Negative for fever.  ?HENT:  Negative for sore throat.   ?Respiratory:  Positive for shortness of breath.   ?Cardiovascular:  Negative for chest pain and near-syncope.  ?Gastrointestinal:  Positive for blood in stool. Negative for abdominal pain and nausea.  ?Genitourinary:  Negative for dysuria and frequency.  ?Musculoskeletal:  Negative for back pain.  ?Skin:  Negative for rash.  ?Neurological:  Positive for weakness.  ? ?Physical Exam ?Updated Vital Signs ?BP 105/60 (BP Location: Right Arm)   Pulse 75   Temp 98.3 ?F (36.8 ?C) (Oral)   Resp 20   Ht '5\' 2"'$  (1.575 m)   Wt 77.1 kg   SpO2 100%   BMI 31.09 kg/m?  ?Physical Exam ?Vitals and nursing note reviewed.  ?Constitutional:   ?   General: She is not in acute distress. ?   Appearance: Normal appearance. She is well-developed.  ?HENT:  ?   Head: Normocephalic and atraumatic.  ?Eyes:  ?   Conjunctiva/sclera: Conjunctivae normal.  ?Cardiovascular:  ?   Rate and Rhythm: Normal rate and regular rhythm.  ?   Heart sounds: No  murmur heard. ?Pulmonary:  ?   Effort: Pulmonary effort is normal. No respiratory distress.  ?   Breath sounds: Normal breath sounds.  ?Abdominal:  ?   Palpations: Abdomen is soft.  ?   Tenderness: There is no abdominal tenderness. There is no guarding or rebound.  ?Genitourinary: ?   Comments: Rectal exam with John as chaperone.  Normal tone no masses.  Sample sent to lab for guaiac. ?Musculoskeletal:     ?   General: No swelling.  ?   Cervical back: Neck supple.  ?Skin: ?   General: Skin is warm and dry.  ?   Capillary Refill: Capillary refill takes less than 2 seconds.  ?  Neurological:  ?   General: No focal deficit present.  ?   Mental Status: She is alert.  ?   Sensory: No sensory deficit.  ?   Motor: No weakness.  ? ? ?ED Results / Procedures / Treatments   ?Labs ?(all labs ordered are listed, but only abnormal results are displayed) ?Labs Reviewed  ?COMPREHENSIVE METABOLIC PANEL - Abnormal; Notable for the following components:  ?    Result Value  ? BUN 36 (*)   ? Creatinine, Ser 1.95 (*)   ? GFR, Estimated 26 (*)   ? All other components within normal limits  ?CBC WITH DIFFERENTIAL/PLATELET - Abnormal; Notable for the following components:  ? RBC 3.03 (*)   ? Hemoglobin 8.4 (*)   ? HCT 28.2 (*)   ? MCHC 29.8 (*)   ? All other components within normal limits  ?PROTIME-INR - Abnormal; Notable for the following components:  ? Prothrombin Time 18.4 (*)   ? INR 1.5 (*)   ? All other components within normal limits  ?POC OCCULT BLOOD, ED - Abnormal; Notable for the following components:  ? Fecal Occult Bld POSITIVE (*)   ? All other components within normal limits  ?BRAIN NATRIURETIC PEPTIDE  ?TYPE AND SCREEN  ?PREPARE RBC (CROSSMATCH)  ?TROPONIN I (HIGH SENSITIVITY)  ?TROPONIN I (HIGH SENSITIVITY)  ? ? ?EKG ?EKG Interpretation ? ?Date/Time:  Wednesday May 02 2022 09:17:25 EDT ?Ventricular Rate:  70 ?PR Interval:  206 ?QRS Duration: 131 ?QT Interval:  433 ?QTC Calculation: 468 ?R Axis:   -28 ?Text  Interpretation: Sinus rhythm Nonspecific intraventricular conduction delay Nonspecific repol abnormality, lateral leads No significant change since prior 12/21 Confirmed by Aletta Edouard [phone]) on 05/02/2022 9:23:12 AM ? ?[employer]

## 2022-05-02 NOTE — ED Notes (Signed)
Resting comfortably, up to b/r, steady gait.  ?

## 2022-05-02 NOTE — Discharge Instructions (Addendum)
You were seen in the emergency department for evaluation of weakness and shortness of breath.  Your blood count was low and you were positive for blood in your stool.  You were given a blood transfusion with improvement in your symptoms.  Please follow-up with your primary care doctor and your GI doctors at [facility].  Return to the emergency department if any worsening or concerning symptoms. ? ?Do not take your Eliquis if you have rectal bleeding again at home, and call your primary care doctor immediately. ?

## 2022-05-02 NOTE — ED Triage Notes (Signed)
Patient with complaints of increasing weakness and shortness of breath over the past 2 weeks. Patient had labs drawn yesterday and her blood count was low.  ?

## 2022-05-03 LAB — TYPE AND SCREEN
ABO/RH(D): O POS
Antibody Screen: NEGATIVE
Unit division: 0

## 2022-05-03 LAB — BPAM RBC
Blood Product Expiration Date: 202306132359
ISSUE DATE / TIME: 202305101413
Unit Type and Rh: 5100

## 2022-05-03 NOTE — Telephone Encounter (Signed)
Let me know when you want to discuss.  ?

## 2022-05-15 ENCOUNTER — Encounter (HOSPITAL_COMMUNITY)
Admission: RE | Admit: 2022-05-15 | Discharge: 2022-05-15 | Disposition: A | Discharge status: Home / Self Care | Payer: Medicare HMO | Source: Ambulatory Visit | Attending: Nephrology | Admitting: Nephrology

## 2022-05-15 VITALS — BP 134/65 | HR 61 | Temp 98.2°F | Resp 16 | Ht 62.0 in | Wt 170.0 lb

## 2022-05-15 DIAGNOSIS — N289 Disorder of kidney and ureter, unspecified: Secondary | ICD-10-CM

## 2022-05-15 DIAGNOSIS — N184 Chronic kidney disease, stage 4 (severe): Secondary | ICD-10-CM | POA: Diagnosis not present

## 2022-05-15 DIAGNOSIS — D649 Anemia, unspecified: Secondary | ICD-10-CM

## 2022-05-15 LAB — IRON AND TIBC
Iron: 22 ug/dL — ABNORMAL LOW (ref 28–170)
Saturation Ratios: 5 % — ABNORMAL LOW (ref 10.4–31.8)
TIBC: 465 ug/dL — ABNORMAL HIGH (ref 250–450)
UIBC: 443 ug/dL

## 2022-05-15 LAB — POCT HEMOGLOBIN-HEMACUE: Hemoglobin: 7.7 g/dL — ABNORMAL LOW (ref 12.0–15.0)

## 2022-05-15 MED ORDER — EPOETIN ALFA-EPBX 3000 UNIT/ML IJ SOLN: 3000.0000 [IU] | Freq: Once | INTRAMUSCULAR | Status: AC

## 2022-05-15 MED ORDER — EPOETIN ALFA-EPBX 3000 UNIT/ML IJ SOLN
INTRAMUSCULAR | Status: AC
  Administered 2022-05-15: 3000 [IU] via SUBCUTANEOUS
  Filled 2022-05-15: qty 1

## 2022-05-15 MED ORDER — EPOETIN ALFA-EPBX 3000 UNIT/ML IJ SOLN
3000.0000 [IU] | Freq: Once | INTRAMUSCULAR | Status: AC
  Administered 2022-05-15: 3000 [IU] via SUBCUTANEOUS

## 2022-05-15 MED ORDER — EPOETIN ALFA-EPBX 3000 UNIT/ML IJ SOLN
INTRAMUSCULAR | Status: AC
  Filled 2022-05-15: qty 1

## 2022-05-16 ENCOUNTER — Other Ambulatory Visit: Payer: Self-pay | Admitting: Gastroenterology

## 2022-05-22 ENCOUNTER — Other Ambulatory Visit: Payer: Self-pay

## 2022-05-22 ENCOUNTER — Encounter (HOSPITAL_COMMUNITY): Payer: Self-pay

## 2022-05-22 ENCOUNTER — Emergency Department (HOSPITAL_COMMUNITY)
Admission: EM | Admit: 2022-05-22 | Discharge: 2022-05-22 | Disposition: A | Discharge status: Home / Self Care | Payer: Medicare HMO | Attending: Emergency Medicine | Admitting: Emergency Medicine

## 2022-05-22 DIAGNOSIS — D649 Anemia, unspecified: Secondary | ICD-10-CM | POA: Diagnosis not present

## 2022-05-22 DIAGNOSIS — Z7901 Long term (current) use of anticoagulants: Secondary | ICD-10-CM | POA: Insufficient documentation

## 2022-05-22 DIAGNOSIS — Z79899 Other long term (current) drug therapy: Secondary | ICD-10-CM | POA: Insufficient documentation

## 2022-05-22 DIAGNOSIS — R0602 Shortness of breath: Secondary | ICD-10-CM | POA: Diagnosis present

## 2022-05-22 LAB — CBC
HCT: 27.1 % — ABNORMAL LOW (ref 36.0–46.0)
Hemoglobin: 8.1 g/dL — ABNORMAL LOW (ref 12.0–15.0)
MCH: 27.1 pg (ref 26.0–34.0)
MCHC: 29.9 g/dL — ABNORMAL LOW (ref 30.0–36.0)
MCV: 90.6 fL (ref 80.0–100.0)
Platelets: 366 10*3/uL (ref 150–400)
RBC: 2.99 MIL/uL — ABNORMAL LOW (ref 3.87–5.11)
RDW: 15.3 % (ref 11.5–15.5)
WBC: 6.2 10*3/uL (ref 4.0–10.5)
nRBC: 0 % (ref 0.0–0.2)

## 2022-05-22 LAB — BASIC METABOLIC PANEL
Anion gap: 6 (ref 5–15)
BUN: 26 mg/dL — ABNORMAL HIGH (ref 8–23)
CO2: 28 mmol/L (ref 22–32)
Calcium: 8.9 mg/dL (ref 8.9–10.3)
Chloride: 103 mmol/L (ref 98–111)
Creatinine, Ser: 2.26 mg/dL — ABNORMAL HIGH (ref 0.44–1.00)
GFR, Estimated: 22 mL/min — ABNORMAL LOW (ref >60)
Glucose, Bld: 112 mg/dL — ABNORMAL HIGH (ref 70–99)
Potassium: 4.2 mmol/L (ref 3.5–5.1)
Sodium: 137 mmol/L (ref 135–145)

## 2022-05-22 LAB — PREPARE RBC (CROSSMATCH)

## 2022-05-22 MED ORDER — SODIUM CHLORIDE 0.9 % IV SOLN
10.0000 mL/h | Freq: Once | INTRAVENOUS | Status: AC
  Administered 2022-05-22: 10 mL/h via INTRAVENOUS

## 2022-05-22 NOTE — ED Provider Notes (Signed)
[facility] Provider Note   CSN: [id number] Arrival date & time: 05/22/22  1761     History  Chief Complaint  Patient presents with   Shortness of Breath    [name] [name] is a 76 y.o. female.   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  I have personally reviewed these records and verified this.  The patient has intermittent anemia likely secondary to chronic GI losses as well as her renal insufficiency.  She is followed closely by Dr. Theador Hawthorne with renal, she required a blood transfusion recently secondary to generalized fatigue and progressive weakness and presents today stating that over the months she has had progressive shortness of breath and generalized fatigue.  She sees the occasional blood in her stools but it is not very often.  No chest pain, no cough, no fever, no changes in vision  Home Medications Prior to Admission medications   Medication Sig Start Date End Date Taking? Authorizing Provider  acetaminophen (TYLENOL) 500 MG tablet Take 1,000 mg by mouth every 6 (six) hours as needed for moderate pain.   Yes [provider]  albuterol (VENTOLIN HFA) 108 (90 Base) MCG/ACT inhaler Inhale 1-2 puffs into the lungs every 6 (six) hours as needed for wheezing or shortness of breath.  09/23/20  Yes [provider]  calcitRIOL (ROCALTROL) 0.25 MCG capsule Take 0.25 mcg by mouth 3 (three) times a week. Monday,Wednesday and Fridays 04/18/22  Yes [provider]  Cholecalciferol (VITAMIN D-3) 25 MCG (1000 UT) CAPS Take 1,000 Units by mouth daily.   Yes [provider]  cyclobenzaprine (FLEXERIL) 5 MG tablet Take 5 mg by mouth 3 (three) times daily as needed for muscle spasms. 06/19/20  Yes [provider]  diclofenac Sodium (VOLTAREN)  1 % GEL Apply 1 application topically 4 (four) times daily as needed (pain).   Yes [provider]  ELIQUIS 5 MG TABS tablet TAKE 1 TABLET BY MOUTH TWICE A DAY 03/14/22  Yes Evans Lance, MD  flecainide (TAMBOCOR) 50 MG tablet TAKE 1 TABLET BY MOUTH TWICE A DAY 03/29/22  Yes Evans Lance, MD  furosemide (LASIX) 40 MG tablet Take 1 tablet (40 mg total) by mouth daily. Patient taking differently: Take 40 mg by mouth See admin instructions. Take 40 mg in the morning and 20 mg every evening 03/29/22  Yes Evans Lance, MD  hydrocortisone (ANUSOL-HC) 2.5 % rectal cream Place 1 application rectally 2 (two) times daily. 03/14/22  Yes Carver, Charles K, DO  losartan (COZAAR) 50 MG tablet Take 1 tablet (50 mg total) by mouth daily. 11/08/21  Yes Evans Lance, MD  lubiprostone (AMITIZA) 24 MCG capsule Take 1 capsule (24 mcg total) by mouth in the morning and at bedtime. 12/06/21 12/06/22 Yes Carver, Elon Alas, DO  octreotide (SANDOSTATIN) 100 MCG/ML SOLN injection Inject 1 mL (100 mcg total) into the skin every 12 (twelve) hours. 05/17/22 05/17/23 Yes Carver, Charles K, DO  pantoprazole (PROTONIX) 40 MG tablet TAKE ONE TABLET BY MOUTH TWICE DAILY BEFORE A MEAL Patient taking differently: Take 40 mg by mouth 2 (two) times daily. 04/16/22  Yes Fayrene Helper, MD  potassium chloride SA (KLOR-CON) 20 MEQ tablet Take 20 mEq by mouth in the morning, at noon, and at bedtime.   Yes [provider]  rosuvastatin (CRESTOR) 10 MG tablet TAKE 1 TABLET BY MOUTH EVERY DAY 08/21/21  Yes Fayrene Helper, MD      Allergies    Amlodipine, Amoxicillin, Doxycycline, Acetaminophen-codeine, Allopurinol, and Tramadol    Review of Systems   Review of Systems  Respiratory:  Positive for shortness of breath.   All other systems reviewed and are negative.  Physical Exam Updated Vital Signs BP 129/77   Pulse 67   Temp 98.4 F (36.9 C) (Oral)   Resp 17   Ht 1.575 m ('5\' 2"'$ )   Wt 79.4 kg   SpO2  100%   BMI 32.01 kg/m  Physical Exam Vitals and nursing note reviewed.  Constitutional:      General: She is not in acute distress.    Appearance: She is well-developed.  HENT:     Head: Normocephalic and atraumatic.     Mouth/Throat:     Pharynx: No oropharyngeal exudate.  Eyes:     General: No scleral icterus.       Right eye: No discharge.        Left eye: No discharge.     Conjunctiva/sclera: Conjunctivae normal.     Pupils: Pupils are equal, round, and reactive to light.     Comments: Conjunctivae clear  Neck:     Thyroid: No thyromegaly.     Vascular: No JVD.  Cardiovascular:     Rate and Rhythm: Normal rate and regular rhythm.     Heart sounds: Normal heart sounds. No murmur heard.   No friction rub. No gallop.  Pulmonary:     Effort: Pulmonary effort is normal. No respiratory distress.     Breath sounds: Normal breath sounds. No wheezing or rales.  Abdominal:     General: Bowel sounds are normal. There is no distension.     Palpations: Abdomen is soft. There is no mass.     Tenderness: There is no abdominal tenderness.  Musculoskeletal:        General: No tenderness. Normal range of motion.     Cervical back: Normal range of motion and neck supple.     Right lower leg: No tenderness.     Left lower leg: No tenderness.  Lymphadenopathy:     Cervical: No cervical adenopathy.  Skin:    General: Skin is warm and dry.     Findings: No erythema or rash.  Neurological:     Mental Status: She is alert.     Coordination: Coordination normal.  Psychiatric:        Behavior: Behavior normal.    ED Results / Procedures / Treatments   Labs (all labs ordered are listed, but only abnormal results are displayed) Labs Reviewed  CBC - Abnormal; Notable for the following components:      Result Value   RBC 2.99 (*)    Hemoglobin 8.1 (*)    HCT 27.1 (*)    MCHC 29.9 (*)    All other components within normal limits  BASIC METABOLIC PANEL - Abnormal; Notable for the  following components:   Glucose, Bld 112 (*)    BUN 26 (*)    Creatinine, Ser 2.26 (*)    GFR, Estimated 22 (*)    All other components within normal limits  TYPE AND SCREEN  PREPARE RBC (CROSSMATCH)    EKG EKG Interpretation  Date/Time:  Tuesday May 22 2022 08:32:56 EDT Ventricular Rate:  72 PR Interval:  55 QRS Duration: 133 QT Interval:  425 QTC Calculation: 466  R Axis:   -6 Text Interpretation: Sinus rhythm Short PR interval IVCD, consider atypical LBBB Confirmed by Noemi Chapel [phone]) on 05/22/2022 9:37:04 AM  Radiology No results found.  Procedures Procedures    Medications Ordered in ED Medications  0.9 %  sodium chloride infusion (0 mL/hr Intravenous Stopped 05/22/22 1329)    ED Course/ Medical Decision Making/ A&P                           Medical Decision Making Amount and/or Complexity of Data Reviewed Labs: ordered.  Risk Prescription drug management.   This patient presents to the ED for concern of Possible anemia, fatigue differential diagnosis includes worsening anemia, electrolyte abnormalities, renal failure worsening    Additional history obtained:  Additional history obtained from electronic medical record External records from outside source obtained and reviewed including capsule endoscopy from [facility]   Lab Tests:  I Ordered, and personally interpreted labs.  The pertinent results include: CBC shows progressive anemia hemoglobin is dropped down to 8.1, metabolic panel has been unremarkable with a creatinine of 2.2 but has known renal insufficiency.     Medicines ordered and prescription drug management:  I ordered medication including blood transfusion for anemia Reevaluation of the patient after these medicines showed that the patient improved symptoms I have reviewed the patients home medicines and have made adjustments as needed   Problem List / ED Course:  I counseled with the patient regarding her history of recurrent  anemia and the need to follow-up with her renal doctor as well as her family doctor both to have blood levels checked but also to get on a plan to get transfusions as needed rather than coming to the ER, the patient is agreeable.  She has no hypoxia or tachycardia, she is on the anticoagulant making pulmonary embolism less likely as a cause of her shortness of breath, no abnormal lung sounds, no coughing, no hypoxia, no fever   Social Determinants of Health:  No other concerns today           Final Clinical Impression(s) / ED Diagnoses Final diagnoses:  Anemia, unspecified type    Rx / DC Orders ED Discharge Orders     None         Noemi Chapel, MD 05/22/22 1430

## 2022-05-22 NOTE — Discharge Instructions (Addendum)
Please talk to your family doctor about the need to use ongoing blood thinners.  Unfortunately your history is such that you have an area that is bleeding internally, and although it does not bleed very fast you do have a slight loss of blood over time.  Also need to talk to your kidney doctors and have your blood levels rechecked and come up with a better plan that will help you not to have to come to the emergency department to get a transfusion in the future.  Until that time please return to the emergency department if you develop severe or worsening symptoms

## 2022-05-22 NOTE — ED Triage Notes (Signed)
Pt presents to ED with complaints of generalized weakness and increased SOB over the past month. Pt states she was here a couple weeks ago and her blood counts was low.

## 2022-05-23 LAB — TYPE AND SCREEN
ABO/RH(D): O POS
Antibody Screen: NEGATIVE
Unit division: 0

## 2022-05-23 LAB — BPAM RBC
Blood Product Expiration Date: 202307012359
ISSUE DATE / TIME: 202305301049
Unit Type and Rh: 5100

## 2022-05-29 ENCOUNTER — Encounter (HOSPITAL_COMMUNITY)
Admission: RE | Admit: 2022-05-29 | Discharge: 2022-05-29 | Disposition: A | Discharge status: Home / Self Care | Payer: Medicare HMO | Source: Ambulatory Visit | Attending: Nephrology | Admitting: Nephrology

## 2022-05-29 DIAGNOSIS — D500 Iron deficiency anemia secondary to blood loss (chronic): Secondary | ICD-10-CM | POA: Insufficient documentation

## 2022-05-29 DIAGNOSIS — N179 Acute kidney failure, unspecified: Secondary | ICD-10-CM | POA: Insufficient documentation

## 2022-05-29 DIAGNOSIS — N184 Chronic kidney disease, stage 4 (severe): Secondary | ICD-10-CM | POA: Insufficient documentation

## 2022-05-29 LAB — RENAL FUNCTION PANEL
Albumin: 4.6 g/dL (ref 3.5–5.0)
Anion gap: 8 (ref 5–15)
BUN: 35 mg/dL — ABNORMAL HIGH (ref 8–23)
CO2: 31 mmol/L (ref 22–32)
Calcium: 9.4 mg/dL (ref 8.9–10.3)
Chloride: 99 mmol/L (ref 98–111)
Creatinine, Ser: 2.05 mg/dL — ABNORMAL HIGH (ref 0.44–1.00)
GFR, Estimated: 25 mL/min — ABNORMAL LOW (ref >60)
Glucose, Bld: 97 mg/dL (ref 70–99)
Phosphorus: 3.6 mg/dL (ref 2.5–4.6)
Potassium: 3.7 mmol/L (ref 3.5–5.1)
Sodium: 138 mmol/L (ref 135–145)

## 2022-05-29 LAB — CBC
HCT: 33.2 % — ABNORMAL LOW (ref 36.0–46.0)
Hemoglobin: 9.9 g/dL — ABNORMAL LOW (ref 12.0–15.0)
MCH: 26.8 pg (ref 26.0–34.0)
MCHC: 29.8 g/dL — ABNORMAL LOW (ref 30.0–36.0)
MCV: 90 fL (ref 80.0–100.0)
Platelets: 358 10*3/uL (ref 150–400)
RBC: 3.69 MIL/uL — ABNORMAL LOW (ref 3.87–5.11)
RDW: 14.8 % (ref 11.5–15.5)
WBC: 6.6 10*3/uL (ref 4.0–10.5)
nRBC: 0 % (ref 0.0–0.2)

## 2022-05-29 LAB — IRON AND TIBC
Iron: 36 ug/dL (ref 28–170)
Saturation Ratios: 8 % — ABNORMAL LOW (ref 10.4–31.8)
TIBC: 478 ug/dL — ABNORMAL HIGH (ref 250–450)
UIBC: 442 ug/dL

## 2022-05-29 LAB — PROTEIN / CREATININE RATIO, URINE
Creatinine, Urine: 31.95 mg/dL
Total Protein, Urine: <3 mg/dL

## 2022-05-29 LAB — POCT HEMOGLOBIN-HEMACUE: Hemoglobin: 10.4 g/dL — ABNORMAL LOW (ref 12.0–15.0)

## 2022-05-30 ENCOUNTER — Encounter: Payer: Self-pay | Admitting: Internal Medicine

## 2022-05-30 ENCOUNTER — Ambulatory Visit (INDEPENDENT_AMBULATORY_CARE_PROVIDER_SITE_OTHER): Payer: Medicare HMO | Admitting: Internal Medicine

## 2022-05-30 VITALS — BP 138/49 | HR 70 | Temp 97.8°F | Ht 62.0 in | Wt 177.4 lb

## 2022-05-30 DIAGNOSIS — K219 Gastro-esophageal reflux disease without esophagitis: Secondary | ICD-10-CM

## 2022-05-30 DIAGNOSIS — D500 Iron deficiency anemia secondary to blood loss (chronic): Secondary | ICD-10-CM

## 2022-05-30 DIAGNOSIS — K5521 Angiodysplasia of colon with hemorrhage: Secondary | ICD-10-CM | POA: Diagnosis not present

## 2022-05-30 DIAGNOSIS — K581 Irritable bowel syndrome with constipation: Secondary | ICD-10-CM

## 2022-05-30 DIAGNOSIS — K589 Irritable bowel syndrome without diarrhea: Secondary | ICD-10-CM | POA: Insufficient documentation

## 2022-05-30 NOTE — Progress Notes (Signed)
Referring Provider: Andres Shad, * Primary Care Physician:  Andres Shad, MD Primary GI:  Dr. Abbey Chatters  Chief Complaint  Patient presents with   Follow-up    Patient here today for a follow up visit. She was recently seen in the Ed at [facility] due to Anemia on 05/22/2022. She says she has not seen any bright red or dark stools since the Ed Visit. She does have dizziness and shortness of breath at times, but thinks this is due to Afib. Was on Ferrous Sulfate,but Dr. August Albino took her off due to increased Fe level.    HPI:   [name] [name] is a 76 y.o. female who presents to the clinic today for follow-up visit.  She has had 2 hospitalizations acute blood loss anemia.  Hospitalization in early December 2021, she underwent EGD which was relatively unremarkable.  Subsequent small bowel capsule endoscopy revealed multiple small bowel AVMs.  She subsequently underwent enteroscopy with APC of multiple AVM lesions on 11/27/2020.     She was discharged from the facility and did well until she suffered a syncopal episode at home.  She reported back to the ER 12/05/2020 and was found to be anemic again with hemoglobin of 7.  She was started on subcutaneous octreotide during that hospitalization.  She did relatively well on this medication until April 2022 when she again had worsening anemia.  She presented to the ER for transfusion.  She was referred to [facility] and underwent double-balloon enteroscopy on 05/19/2021 by Dr. Durene Fruits.  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  Since that time she has done well for the most part.  Most recent hemoglobin 10.4 on 05/29/2022.  Did receive 1 unit PRBCs in April.   Of note, patient is on Eliquis for afib.   Also has chronic reflux which is well controlled on pantoprazole 40 mg twice daily.  Also with IBS constipation predominant  well controlled on Amitiza 24 mg twice daily.  Today she states she is doing well.  Denies any rectal bleeding or melena.   Past Medical History:  Diagnosis Date   Anemia    Chronic kidney disease    Constipation 07/17/2017   GERD (gastroesophageal reflux disease)    Gout    Grade II diastolic dysfunction 47/03/2594   Hyperlipidemia    Hypertension    Hypokalemia    OSA (obstructive sleep apnea) 08/09/2020   PAF (paroxysmal atrial fibrillation) (HCC)    PAF (paroxysmal atrial fibrillation) (HCC)    PUD (peptic ulcer disease)    Syncope    Neurocardiogenic    Past Surgical History:  Procedure Laterality Date   ABDOMINAL AORTOGRAM W/LOWER EXTREMITY Right 12/13/2020   Procedure: ABDOMINAL AORTOGRAM W/LOWER EXTREMITY;  Surgeon: Serafina Mitchell, MD;  Location: [place] [facility];  Service: Cardiovascular;  Laterality: Right;   ABDOMINAL HYSTERECTOMY     AMPUTATION Right 12/14/2020   Procedure: Right fifth toe amputation;  Surgeon: Rosetta Posner, MD;  Location: [facility] OR;  Service: Vascular;  Laterality: Right;   CATARACT EXTRACTION Bilateral    COLONOSCOPY N/A 10/07/2014   Dr. Oneida Alar: redundant left colon, moderate sized external hemorrhoids    COLONOSCOPY N/A 03/11/2020   Surgeon: Danie Binder, MD;  9 polyps, majority of which were tubular adenomas, nodular mucosa in the anus s/p biopsy which was benign.   ENTEROSCOPY N/A 11/27/2020  Surgeon: Montez Morita, Quillian Quince, MD; multiple nonbleeding AVMs in the duodenum s/p APC therapy, few nonbleeding AVMs in jejunum s/p argon beam coagulation.   ESOPHAGOGASTRODUODENOSCOPY (EGD) WITH PROPOFOL N/A 11/25/2020    Surgeon: Daneil Dolin, MD; Normal, s/p capsule deployment   [place] N/A 11/25/2020    Surgeon: Daneil Dolin, MD; 4/nonbleeding AVMs in the proximal small bowel large fresh blood, 2 bleeding lymphangiectasis in SB.    HOT HEMOSTASIS  11/27/2020   Procedure: HOT HEMOSTASIS (ARGON PLASMA COAGULATION/BICAP);  Surgeon:  Montez Morita, Quillian Quince, MD;  [facility];  Service: Gastroenterology;;   LIPOMA RESECTION     PERIPHERAL VASCULAR INTERVENTION Right 12/13/2020   Procedure: PERIPHERAL VASCULAR INTERVENTION;  Surgeon: Serafina Mitchell, MD;  Location: [place] [facility];  Service: Cardiovascular;  Laterality: Right;  SFA/PT/AT   POLYPECTOMY  03/11/2020   Procedure: POLYPECTOMY;  Surgeon: Danie Binder, MD;  Location: [facility];  Service: Endoscopy;;  cecal, transverse, descending, sigmoid   TEE WITHOUT CARDIOVERSION N/A 05/10/2021   Procedure: TRANSESOPHAGEAL ECHOCARDIOGRAM (TEE) WATCHMEN EVALUATION;  Surgeon: Josue Hector, MD;  Location: [place] [facility];  Service: Cardiovascular;  Laterality: N/A;    Current Outpatient Medications  Medication Sig Dispense Refill   acetaminophen (TYLENOL) 500 MG tablet Take 1,000 mg by mouth every 6 (six) hours as needed for moderate pain.     albuterol (VENTOLIN HFA) 108 (90 Base) MCG/ACT inhaler Inhale 1-2 puffs into the lungs every 6 (six) hours as needed for wheezing or shortness of breath.      calcitRIOL (ROCALTROL) 0.25 MCG capsule Take 0.25 mcg by mouth 3 (three) times a week. Monday,Wednesday and Fridays     Cholecalciferol (VITAMIN D-3) 25 MCG (1000 UT) CAPS Take 1,000 Units by mouth daily.     cyclobenzaprine (FLEXERIL) 5 MG tablet Take 5 mg by mouth 3 (three) times daily as needed for muscle spasms.     diclofenac Sodium (VOLTAREN) 1 % GEL Apply 1 application topically 4 (four) times daily as needed (pain).     ELIQUIS 5 MG TABS tablet TAKE 1 TABLET BY MOUTH TWICE A DAY 180 tablet 1   flecainide (TAMBOCOR) 50 MG tablet TAKE 1 TABLET BY MOUTH TWICE A DAY 60 tablet 1   furosemide (LASIX) 40 MG tablet Take 1 tablet (40 mg total) by mouth daily. (Patient taking differently: Take 40 mg by mouth See admin instructions. Take 40 mg in the morning and 20 mg every evening) 90 tablet 0   hydrocortisone (ANUSOL-HC) 2.5 % rectal cream Place 1 application  rectally 2 (two) times daily. 30 g 1   losartan (COZAAR) 50 MG tablet Take 1 tablet (50 mg total) by mouth daily. 90 tablet 3   lubiprostone (AMITIZA) 24 MCG capsule Take 1 capsule (24 mcg total) by mouth in the morning and at bedtime. 180 capsule 3   octreotide (SANDOSTATIN) 100 MCG/ML SOLN injection Inject 1 mL (100 mcg total) into the skin every 12 (twelve) hours. 60 mL 11   pantoprazole (PROTONIX) 40 MG tablet TAKE ONE TABLET BY MOUTH TWICE DAILY BEFORE A MEAL (Patient taking differently: Take 40 mg by mouth 2 (two) times daily.) 60 tablet 1   potassium chloride SA (KLOR-CON) 20 MEQ tablet Take 20 mEq by mouth in the morning, at noon, and at bedtime.     rosuvastatin (CRESTOR) 10 MG tablet TAKE 1 TABLET BY MOUTH EVERY DAY 30 tablet 0   No current facility-administered medications for this visit.    Allergies  as of 05/30/2022 - Review Complete 05/30/2022  Allergen Reaction Noted   Amlodipine  08/03/2020   Amoxicillin Hives 03/04/2020   Doxycycline  11/23/2020   Acetaminophen-codeine Hives and Rash 09/16/2020   Allopurinol Hives and Rash    Tramadol Hives and Rash     Family History  Problem Relation Age of Onset   Stroke Mother    Dementia Father    Cancer - Other Sister    Atrial fibrillation Sister    Heart failure Sister    Colon cancer Maternal Grandmother        diagnosed in her 85s    Social History   Socioeconomic History   Marital status: Single    Spouse name: Not on file   Number of children: Not on file   Years of education: Not on file   Highest education level: Not on file  Occupational History   Occupation: Retired  Tobacco Use   Smoking status: Former    Packs/day: 1.00    Years: 20.00    Pack years: 20.00    Types: Cigarettes    Start date: 03/16/1964    Quit date: 10/07/1997    Years since quitting: 24.6   Smokeless tobacco: Never  Vaping Use   Vaping Use: Never used  Substance and Sexual Activity   Alcohol use: No    Alcohol/week: 0.0  standard drinks   Drug use: No   Sexual activity: Yes    Partners: Male  Other Topics Concern   Not on file  Social History Narrative   Not on file   Social Determinants of Health   Financial Resource Strain: Not on file  Food Insecurity: Not on file  Transportation Needs: Not on file  Physical Activity: Not on file  Stress: Not on file  Social Connections: Not on file    Subjective: Review of Systems  Constitutional:  Negative for chills and fever.  HENT:  Negative for congestion and hearing loss.   Eyes:  Negative for blurred vision and double vision.  Respiratory:  Negative for cough and shortness of breath.   Cardiovascular:  Negative for chest pain and palpitations.  Gastrointestinal:  Positive for constipation. Negative for abdominal pain, blood in stool, diarrhea, heartburn, melena and vomiting.  Genitourinary:  Negative for dysuria and urgency.  Musculoskeletal:  Negative for joint pain and myalgias.  Skin:  Negative for itching and rash.  Neurological:  Negative for dizziness and headaches.  Psychiatric/Behavioral:  Negative for depression. The patient is not nervous/anxious.     Objective: BP (!) 138/49 (BP Location: Left Arm, Patient Position: Sitting, Cuff Size: Large)   Pulse 70   Temp 97.8 F (36.6 C) (Oral)   Ht '5\' 2"'$  (1.575 m)   Wt 177 lb 6.4 oz (80.5 kg)   BMI 32.45 kg/m  Physical Exam Constitutional:      Appearance: Normal appearance.  HENT:     Head: Normocephalic and atraumatic.  Eyes:     Extraocular Movements: Extraocular movements intact.     Conjunctiva/sclera: Conjunctivae normal.  Cardiovascular:     Rate and Rhythm: Normal rate and regular rhythm.  Pulmonary:     Effort: Pulmonary effort is normal.     Breath sounds: Normal breath sounds.  Abdominal:     General: Bowel sounds are normal.     Palpations: Abdomen is soft.  Musculoskeletal:        General: No swelling. Normal range of motion.     Cervical back: Normal range of  motion and neck supple.  Skin:    General: Skin is warm and dry.     Coloration: Skin is not jaundiced.  Neurological:     General: No focal deficit present.     Mental Status: She is alert and oriented to person, place, and time.  Psychiatric:        Mood and Affect: Mood normal.        Behavior: Behavior normal.     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   Plan: Discussed AVMs in depth with patient  She is status post a balloon enteroscopy 05/19/2021 at [facility] by Dr. Durene Fruits with a total of 27 AVMs ablated by APC in the duodenum and jejunum.  Most recent hemoglobin 10.4 on 05/29/2022.  Continue on subcutaneous octreotide.  We may need to refer her back for repeat double-balloon enteroscopy if further bleeding occurs.  Irritable bowel syndrome constipation predominant well controlled on Amitiza 24 mg twice daily.  We will continue.  Reflux well-controlled on pantoprazole 40 mg twice daily.  We will continue.  Consider weaning to once daily as tolerated.  Otherwise follow-up in 6 months   05/30/2022 11:27 AM   Disclaimer: This note was dictated with voice recognition software. Similar sounding words can inadvertently be transcribed and may not be corrected upon review.

## 2022-05-30 NOTE — Patient Instructions (Signed)
I am happy to hear that you are doing well.  Continue on Amitiza for your chronic constipation.  Continue pantoprazole for your chronic GERD.  Continue subcutaneous octreotide for your history of GI bleeding and arteriovenous malformations.  Follow-up with GI in 6 months or sooner if needed.  It was very nice seeing you again today.  Dr. Abbey Chatters  At [facility] we value your feedback. You may receive a survey about your visit today. Please share your experience as we strive to create trusting relationships with our patients to provide genuine, compassionate, quality care.  We appreciate your understanding and patience as we review any laboratory studies, imaging, and other diagnostic tests that are ordered as we care for you. Our office policy is 5 business days for review of these results, and any emergent or urgent results are addressed in a timely manner for your best interest. If you do not hear from our office in 1 week, please contact us.   We also encourage the use of MyChart, which contains your medical information for your review as well. If you are not enrolled in this feature, an access code is on this after visit summary for your convenience. Thank you for allowing [place] to be involved in your care.  It was great to see you today!  I hope you have a great rest of your Spring!    Elon Alas. Abbey Chatters, D.O. Gastroenterology and Hepatology [facility]

## 2022-06-06 ENCOUNTER — Other Ambulatory Visit: Payer: Self-pay | Admitting: Internal Medicine

## 2022-06-12 ENCOUNTER — Encounter (HOSPITAL_COMMUNITY)
Admission: RE | Admit: 2022-06-12 | Discharge: 2022-06-12 | Disposition: A | Discharge status: Home / Self Care | Payer: Medicare HMO | Source: Ambulatory Visit | Attending: Nephrology | Admitting: Nephrology

## 2022-06-12 VITALS — BP 132/56 | HR 62 | Temp 98.5°F | Resp 18

## 2022-06-12 DIAGNOSIS — D500 Iron deficiency anemia secondary to blood loss (chronic): Secondary | ICD-10-CM | POA: Diagnosis not present

## 2022-06-12 DIAGNOSIS — N179 Acute kidney failure, unspecified: Secondary | ICD-10-CM

## 2022-06-12 LAB — POCT HEMOGLOBIN-HEMACUE: Hemoglobin: 8.1 g/dL — ABNORMAL LOW (ref 12.0–15.0)

## 2022-06-12 MED ORDER — EPOETIN ALFA-EPBX 10000 UNIT/ML IJ SOLN: 6000.0000 [IU] | Freq: Once | INTRAMUSCULAR | Status: DC

## 2022-06-12 MED ORDER — EPOETIN ALFA-EPBX 10000 UNIT/ML IJ SOLN
INTRAMUSCULAR | Status: AC
  Administered 2022-06-12: 6000 [IU]
  Filled 2022-06-12: qty 1

## 2022-06-13 ENCOUNTER — Other Ambulatory Visit: Payer: Self-pay | Admitting: Internal Medicine

## 2022-06-13 NOTE — Telephone Encounter (Signed)
Prescription refill request for Eliquis received. Indication:Afib Last office visit:4/23 Scr:2.2 Age: 76 Weight:80.5 kg  Prescription refilled

## 2022-06-21 ENCOUNTER — Observation Stay (HOSPITAL_COMMUNITY)
Admission: EM | Admit: 2022-06-21 | Discharge: 2022-06-23 | Disposition: A | Discharge status: Home / Self Care | Payer: Medicare HMO | Attending: Family Medicine | Admitting: Family Medicine

## 2022-06-21 ENCOUNTER — Other Ambulatory Visit: Payer: Self-pay

## 2022-06-21 ENCOUNTER — Encounter (HOSPITAL_COMMUNITY): Payer: Self-pay | Admitting: Emergency Medicine

## 2022-06-21 ENCOUNTER — Emergency Department (HOSPITAL_COMMUNITY): Payer: Medicare HMO

## 2022-06-21 DIAGNOSIS — R0609 Other forms of dyspnea: Secondary | ICD-10-CM

## 2022-06-21 DIAGNOSIS — Z87891 Personal history of nicotine dependence: Secondary | ICD-10-CM | POA: Diagnosis not present

## 2022-06-21 DIAGNOSIS — Z79899 Other long term (current) drug therapy: Secondary | ICD-10-CM | POA: Insufficient documentation

## 2022-06-21 DIAGNOSIS — Z89421 Acquired absence of other right toe(s): Secondary | ICD-10-CM | POA: Diagnosis not present

## 2022-06-21 DIAGNOSIS — R0602 Shortness of breath: Secondary | ICD-10-CM | POA: Diagnosis present

## 2022-06-21 DIAGNOSIS — K5521 Angiodysplasia of colon with hemorrhage: Secondary | ICD-10-CM | POA: Diagnosis not present

## 2022-06-21 DIAGNOSIS — I130 Hypertensive heart and chronic kidney disease with heart failure and stage 1 through stage 4 chronic kidney disease, or unspecified chronic kidney disease: Secondary | ICD-10-CM | POA: Insufficient documentation

## 2022-06-21 DIAGNOSIS — N184 Chronic kidney disease, stage 4 (severe): Secondary | ICD-10-CM | POA: Insufficient documentation

## 2022-06-21 DIAGNOSIS — I480 Paroxysmal atrial fibrillation: Secondary | ICD-10-CM | POA: Insufficient documentation

## 2022-06-21 DIAGNOSIS — I10 Essential (primary) hypertension: Secondary | ICD-10-CM

## 2022-06-21 DIAGNOSIS — D649 Anemia, unspecified: Secondary | ICD-10-CM

## 2022-06-21 DIAGNOSIS — Z7901 Long term (current) use of anticoagulants: Secondary | ICD-10-CM | POA: Insufficient documentation

## 2022-06-21 DIAGNOSIS — R0600 Dyspnea, unspecified: Secondary | ICD-10-CM | POA: Diagnosis present

## 2022-06-21 DIAGNOSIS — K922 Gastrointestinal hemorrhage, unspecified: Secondary | ICD-10-CM

## 2022-06-21 DIAGNOSIS — I5030 Unspecified diastolic (congestive) heart failure: Secondary | ICD-10-CM | POA: Insufficient documentation

## 2022-06-21 DIAGNOSIS — D509 Iron deficiency anemia, unspecified: Principal | ICD-10-CM | POA: Insufficient documentation

## 2022-06-21 LAB — CBC WITH DIFFERENTIAL/PLATELET
Abs Immature Granulocytes: 0.03 10*3/uL (ref 0.00–0.07)
Basophils Absolute: 0.1 10*3/uL (ref 0.0–0.1)
Basophils Relative: 1 %
Eosinophils Absolute: 0.1 10*3/uL (ref 0.0–0.5)
Eosinophils Relative: 1 %
HCT: 27.3 % — ABNORMAL LOW (ref 36.0–46.0)
Hemoglobin: 7.8 g/dL — ABNORMAL LOW (ref 12.0–15.0)
Immature Granulocytes: 0 %
Lymphocytes Relative: 26 %
Lymphs Abs: 2.4 10*3/uL (ref 0.7–4.0)
MCH: 25.6 pg — ABNORMAL LOW (ref 26.0–34.0)
MCHC: 28.6 g/dL — ABNORMAL LOW (ref 30.0–36.0)
MCV: 89.5 fL (ref 80.0–100.0)
Monocytes Absolute: 0.7 10*3/uL (ref 0.1–1.0)
Monocytes Relative: 7 %
Neutro Abs: 6.1 10*3/uL (ref 1.7–7.7)
Neutrophils Relative %: 65 %
Platelets: 384 10*3/uL (ref 150–400)
RBC: 3.05 MIL/uL — ABNORMAL LOW (ref 3.87–5.11)
RDW: 16 % — ABNORMAL HIGH (ref 11.5–15.5)
WBC: 9.3 10*3/uL (ref 4.0–10.5)
nRBC: 0 % (ref 0.0–0.2)

## 2022-06-21 LAB — PREPARE RBC (CROSSMATCH)

## 2022-06-21 LAB — COMPREHENSIVE METABOLIC PANEL
ALT: 12 U/L (ref 0–44)
AST: 21 U/L (ref 15–41)
Albumin: 4.3 g/dL (ref 3.5–5.0)
Alkaline Phosphatase: 59 U/L (ref 38–126)
Anion gap: 11 (ref 5–15)
BUN: 33 mg/dL — ABNORMAL HIGH (ref 8–23)
CO2: 25 mmol/L (ref 22–32)
Calcium: 8.9 mg/dL (ref 8.9–10.3)
Chloride: 100 mmol/L (ref 98–111)
Creatinine, Ser: 2.29 mg/dL — ABNORMAL HIGH (ref 0.44–1.00)
GFR, Estimated: 22 mL/min — ABNORMAL LOW (ref >60)
Glucose, Bld: 121 mg/dL — ABNORMAL HIGH (ref 70–99)
Potassium: 3.5 mmol/L (ref 3.5–5.1)
Sodium: 136 mmol/L (ref 135–145)
Total Bilirubin: 0.7 mg/dL (ref 0.3–1.2)
Total Protein: 7.2 g/dL (ref 6.5–8.1)

## 2022-06-21 LAB — TROPONIN I (HIGH SENSITIVITY)
Troponin I (High Sensitivity): 14 ng/L (ref <18)
Troponin I (High Sensitivity): 17 ng/L (ref <18)

## 2022-06-21 LAB — BRAIN NATRIURETIC PEPTIDE: B Natriuretic Peptide: 171 pg/mL — ABNORMAL HIGH (ref 0.0–100.0)

## 2022-06-21 MED ORDER — ACETAMINOPHEN 325 MG PO TABS: 650.0000 mg | ORAL_TABLET | Freq: Four times a day (QID) | ORAL | Status: DC | PRN

## 2022-06-21 MED ORDER — PANTOPRAZOLE SODIUM 40 MG IV SOLR
40.0000 mg | Freq: Every day | INTRAVENOUS | Status: DC
  Administered 2022-06-22 (×2): 40 mg via INTRAVENOUS
  Filled 2022-06-21 (×2): qty 10

## 2022-06-21 MED ORDER — ONDANSETRON HCL 4 MG/2ML IJ SOLN: 4.0000 mg | Freq: Four times a day (QID) | INTRAMUSCULAR | Status: DC | PRN

## 2022-06-21 MED ORDER — LUBIPROSTONE 24 MCG PO CAPS
24.0000 ug | ORAL_CAPSULE | Freq: Two times a day (BID) | ORAL | Status: DC
  Administered 2022-06-22 – 2022-06-23 (×2): 24 ug via ORAL
  Filled 2022-06-21 (×2): qty 1

## 2022-06-21 MED ORDER — FUROSEMIDE 10 MG/ML IJ SOLN
40.0000 mg | Freq: Once | INTRAMUSCULAR | Status: AC
  Administered 2022-06-22: 40 mg via INTRAVENOUS
  Filled 2022-06-21: qty 4

## 2022-06-21 MED ORDER — SODIUM CHLORIDE 0.9% IV SOLUTION: Freq: Once | INTRAVENOUS | Status: AC

## 2022-06-21 MED ORDER — FUROSEMIDE 40 MG PO TABS
40.0000 mg | ORAL_TABLET | Freq: Every day | ORAL | Status: DC
  Administered 2022-06-22 – 2022-06-23 (×2): 40 mg via ORAL
  Filled 2022-06-21 (×2): qty 1

## 2022-06-21 MED ORDER — FLECAINIDE ACETATE 50 MG PO TABS
50.0000 mg | ORAL_TABLET | Freq: Two times a day (BID) | ORAL | Status: DC
  Administered 2022-06-22 – 2022-06-23 (×4): 50 mg via ORAL
  Filled 2022-06-21 (×4): qty 1

## 2022-06-21 MED ORDER — OCTREOTIDE ACETATE 100 MCG/ML IJ SOLN
100.0000 ug | Freq: Two times a day (BID) | INTRAMUSCULAR | Status: DC
  Administered 2022-06-22 – 2022-06-23 (×3): 100 ug via SUBCUTANEOUS
  Filled 2022-06-21 (×7): qty 1

## 2022-06-21 MED ORDER — ALBUTEROL SULFATE (2.5 MG/3ML) 0.083% IN NEBU: 3.0000 mL | INHALATION_SOLUTION | Freq: Four times a day (QID) | RESPIRATORY_TRACT | Status: DC | PRN

## 2022-06-21 MED ORDER — POTASSIUM CHLORIDE CRYS ER 20 MEQ PO TBCR
20.0000 meq | EXTENDED_RELEASE_TABLET | Freq: Two times a day (BID) | ORAL | Status: DC
  Administered 2022-06-22 – 2022-06-23 (×4): 20 meq via ORAL
  Filled 2022-06-21 (×4): qty 1

## 2022-06-21 MED ORDER — LOSARTAN POTASSIUM 50 MG PO TABS
50.0000 mg | ORAL_TABLET | Freq: Every day | ORAL | Status: DC
  Administered 2022-06-22 – 2022-06-23 (×2): 50 mg via ORAL
  Filled 2022-06-21 (×2): qty 1

## 2022-06-21 MED ORDER — ACETAMINOPHEN 650 MG RE SUPP: 650.0000 mg | Freq: Four times a day (QID) | RECTAL | Status: DC | PRN

## 2022-06-21 MED ORDER — ONDANSETRON HCL 4 MG PO TABS: 4.0000 mg | ORAL_TABLET | Freq: Four times a day (QID) | ORAL | Status: DC | PRN

## 2022-06-21 MED ORDER — POLYETHYLENE GLYCOL 3350 17 G PO PACK: 17.0000 g | PACK | Freq: Every day | ORAL | Status: DC | PRN

## 2022-06-21 MED ORDER — CALCITRIOL 0.25 MCG PO CAPS
0.2500 ug | ORAL_CAPSULE | ORAL | Status: DC
  Administered 2022-06-22: 0.25 ug via ORAL
  Filled 2022-06-21: qty 1

## 2022-06-21 NOTE — ED Notes (Signed)
Ice water, saltines, and peanut-butter given to pt

## 2022-06-21 NOTE — ED Notes (Signed)
Pt ambulated to the bathroom unassisted.  

## 2022-06-21 NOTE — H&P (Signed)
History and Physical    [name] [name] TKW:[id number] DOB: Nov 08, 1946 DOA: 06/21/2022  PCP: Andres Shad, MD   Patient coming from: Home  I have personally briefly reviewed patient's old medical records in [place]  Chief Complaint: Difficulty breathing with activity  HPI: [name] [name] is a 76 y.o. female with medical history significant for GI bleed secondary to AVMs, paroxysmal atrial fibrillation, hypertension, OSA, iron deficiency, CKD. Patient presented to ED with complaints of weakness, difficulty breathing over the past 2 to 3 days.  She reports difficulty breathing with exertion.  No chest pain.  No lower extremity swelling.  She reports mild intermittent dizziness that is unchanged.  No cough.  No fevers no chills. She has history of AVMs, but reports she has not had any blood in her stools or black stools in a while.  ED Course: Temperature 98.1.  Heart rate 46-86.  Respiratory rate 15-20.  Blood pressure systolic 924-268.  O2 sat greater than 98% on room air.  Hemoglobin 7.8.  Troponin 14 > 17.  BNP 171.  EKG shows sinus rhythm.  Chest x-ray clear.  ED provider noted cardiac murmur that was not documented previously.  Consulted cardiology, recommended repeat echo.  On ambulating patient to the ED, patient became very dyspneic hence hospitalization.  Review of Systems: As per HPI all other systems reviewed and negative.  Past Medical History:  Diagnosis Date   Anemia    Chronic kidney disease    Constipation 07/17/2017   GERD (gastroesophageal reflux disease)    Gout    Grade II diastolic dysfunction 34/12/9620   Hyperlipidemia    Hypertension    Hypokalemia    OSA (obstructive sleep apnea) 08/09/2020   PAF (paroxysmal atrial fibrillation) (HCC)    PAF (paroxysmal atrial fibrillation) (HCC)    PUD (peptic ulcer disease)    Syncope    Neurocardiogenic    Past Surgical History:  Procedure Laterality Date   ABDOMINAL AORTOGRAM W/LOWER EXTREMITY  Right 12/13/2020   Procedure: ABDOMINAL AORTOGRAM W/LOWER EXTREMITY;  Surgeon: Serafina Mitchell, MD;  Location: [facility];  Service: Cardiovascular;  Laterality: Right;   ABDOMINAL HYSTERECTOMY     AMPUTATION Right 12/14/2020   Procedure: Right fifth toe amputation;  Surgeon: Rosetta Posner, MD;  Location: [facility] OR;  Service: Vascular;  Laterality: Right;   CATARACT EXTRACTION Bilateral    COLONOSCOPY N/A 10/07/2014   Dr. Oneida Alar: redundant left colon, moderate sized external hemorrhoids    COLONOSCOPY N/A 03/11/2020   Surgeon: Danie Binder, MD;  9 polyps, majority of which were tubular adenomas, nodular mucosa in the anus s/p biopsy which was benign.   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    HOT HEMOSTASIS  11/27/2020   Procedure: HOT HEMOSTASIS (ARGON PLASMA COAGULATION/BICAP);  Surgeon: Montez Morita, Quillian Quince, MD;  Location: [facility];  Service: Gastroenterology;;   LIPOMA RESECTION     PERIPHERAL VASCULAR INTERVENTION Right 12/13/2020   Procedure: PERIPHERAL VASCULAR INTERVENTION;  Surgeon: Serafina Mitchell, MD;  Location: [facility];  Service: Cardiovascular;  Laterality: Right;  SFA/PT/AT   POLYPECTOMY  03/11/2020   Procedure: POLYPECTOMY;  Surgeon: Danie Binder, MD;  Location: [facility];  Service: Endoscopy;;  cecal, transverse, descending, sigmoid   TEE WITHOUT CARDIOVERSION N/A 05/10/2021   Procedure: TRANSESOPHAGEAL ECHOCARDIOGRAM (TEE) WATCHMEN EVALUATION;  Surgeon: Josue Hector, MD;  Location: [facility];   Service: Cardiovascular;  Laterality: N/A;     reports that she quit smoking about 24 years ago. Her smoking use included cigarettes. She started smoking about 58 years ago. She has a 20.00 pack-year smoking history. She has never used smokeless tobacco. She reports that she does not drink alcohol and does not use drugs.  Allergies  Allergen Reactions   Amlodipine     Felt she retained fluid in her chest    Amoxicillin Hives    Did it involve swelling of the face/tongue/throat, SOB, or low BP? No Did it involve sudden or severe rash/hives, skin peeling, or any reaction on the inside of your mouth or nose? No Did you need to seek medical attention at a hospital or doctor's office? No When did it last happen?      10 + years If all above answers are "NO", may proceed with cephalosporin use.    Doxycycline     " sick and weak"   Acetaminophen-Codeine Hives and Rash   Allopurinol Hives and Rash   Tramadol Hives and Rash    Family History  Problem Relation Age of Onset   Stroke Mother    Dementia Father    Cancer - Other Sister    Atrial fibrillation Sister    Heart failure Sister    Colon cancer Maternal Grandmother        diagnosed in her 52s    Prior to Admission medications   Medication Sig Start Date End Date Taking? Authorizing Provider  acetaminophen (TYLENOL) 500 MG tablet Take 1,000 mg by mouth every 6 (six) hours as needed for moderate pain.   Yes [provider]  albuterol (VENTOLIN HFA) 108 (90 Base) MCG/ACT inhaler Inhale 1-2 puffs into the lungs every 6 (six) hours as needed for wheezing or shortness of breath.  09/23/20  Yes [provider]  calcitRIOL (ROCALTROL) 0.25 MCG capsule Take 0.25 mcg by mouth 3 (three) times a week. Monday,Wednesday and Fridays 04/18/22  Yes [provider]  Cholecalciferol (VITAMIN D-3) 25 MCG (1000 UT) CAPS Take 1,000 Units by mouth daily.   Yes [provider]  cyclobenzaprine (FLEXERIL) 5 MG tablet  Take 5 mg by mouth 3 (three) times daily as needed for muscle spasms. 06/19/20  Yes [provider]  diclofenac Sodium (VOLTAREN) 1 % GEL Apply 1 application topically 4 (four) times daily as needed (pain).   Yes [provider]  ELIQUIS 5 MG TABS tablet TAKE 1 TABLET BY MOUTH TWICE A DAY 06/13/22  Yes Evans Lance, MD  flecainide (TAMBOCOR) 50 MG tablet TAKE 1 TABLET BY MOUTH TWICE A DAY 06/06/22  Yes Evans Lance, MD  furosemide (LASIX) 40 MG tablet Take 1 tablet (40 mg total) by mouth daily. Patient taking differently: Take 40 mg by mouth See admin instructions. Take 40 mg in the morning and 20 mg every evening 03/29/22  Yes Evans Lance, MD  hydrocortisone (ANUSOL-HC) 2.5 % rectal cream Place 1 application rectally 2 (two) times daily. 03/14/22  Yes Carver, Charles K, DO  losartan (COZAAR) 50 MG tablet Take 1 tablet (50 mg total) by mouth daily. 11/08/21  Yes Evans Lance, MD  lubiprostone (AMITIZA) 24 MCG capsule Take 1 capsule (24 mcg total) by mouth in the morning and at bedtime. 12/06/21 12/06/22 Yes Carver, Elon Alas, DO  octreotide (SANDOSTATIN) 100 MCG/ML SOLN injection Inject 1 mL (100 mcg total) into the skin every 12 (twelve) hours. 05/17/22 05/17/23 Yes Carver, Charles K, DO  pantoprazole (PROTONIX) 40 MG tablet TAKE ONE TABLET BY MOUTH TWICE DAILY BEFORE A MEAL Patient taking differently: Take 40 mg by mouth daily. 04/16/22  Yes Fayrene Helper, MD  potassium chloride SA (KLOR-CON) 20 MEQ tablet Take 20 mEq by mouth in the morning, at noon, and at bedtime.   Yes [provider]  rosuvastatin (CRESTOR) 10 MG tablet TAKE 1 TABLET BY MOUTH EVERY DAY 08/21/21  Yes Fayrene Helper, MD  vitamin C (ASCORBIC ACID) 250 MG tablet Take 250 mg by mouth daily.   Yes [provider]    Physical Exam: Vitals:   06/21/22 2100 06/21/22 2130 06/21/22 2200 06/21/22 2205  BP: (!) 110/49 (!) 134/55 (!) 126/57   Pulse: (!) 46 73 68   Resp: _0 Temp:    98.2 F (36.8 C)  TempSrc:    Oral  SpO2: 100% 98% 100%   Weight:      Height:        Constitutional: NAD, calm, comfortable Vitals:   06/21/22 2100 06/21/22 2130 06/21/22 2200 06/21/22 2205  BP: (!) 110/49 (!) 134/55 (!) 126/57   Pulse: (!) 46 73 68   Resp: _1 Temp:    98.2 F (36.8 C)  TempSrc:    Oral  SpO2: 100% 98% 100%   Weight:      Height:       Eyes: PERRL, lids and conjunctivae normal ENMT: Mucous membranes are moist.   Neck: normal, supple, no masses, no thyromegaly Respiratory: clear to auscultation bilaterally, no wheezing, no crackles. Normal respiratory effort. No accessory muscle use.  Cardiovascular: Regular rate and rhythm, 4-2/8 systolic murmur, no rubs / gallops. No extremity edema.  Lower extremities warm. Abdomen: no tenderness, no masses palpated. No hepatosplenomegaly. Bowel sounds positive.  Musculoskeletal: no clubbing / cyanosis. No joint deformity upper and lower extremities. Good ROM, no contractures. Normal muscle tone.  Skin: no rashes, lesions, ulcers. No induration Neurologic: No apparent cranial nerve abnormality, moving extremities spontaneously. Psychiatric: Normal judgment and insight. Alert and oriented x 3. Normal mood.   Labs on Admission: I have personally reviewed following labs and imaging studies  CBC: Recent Labs  Lab 06/21/22 1602  WBC 9.3  NEUTROABS 6.1  HGB 7.8*  HCT 27.3*  MCV 89.5  PLT 768   Basic Metabolic Panel: Recent Labs  Lab 06/21/22 1602  NA 136  K 3.5  CL 100  CO2 25  GLUCOSE 121*  BUN 33*  CREATININE 2.29*  CALCIUM 8.9   GFR: Estimated Creatinine Clearance: 20.4 mL/min (A) (by C-G formula based on SCr of 2.29 mg/dL (H)). Liver Function Tests: Recent Labs  Lab 06/21/22 1602  AST 21  ALT 12  ALKPHOS 59  BILITOT 0.7  PROT 7.2  ALBUMIN 4.3    Radiological Exams on Admission: DG Chest 2 View  Result Date: 06/21/2022 CLINICAL DATA:  Dyspnea. EXAM: CHEST - 2 VIEW  COMPARISON:  Chest x-ray dated December 05, 2020. FINDINGS: The heart size and mediastinal contours are within normal limits. Both lungs are clear. The visualized skeletal structures are unremarkable. IMPRESSION: No active cardiopulmonary  disease. Electronically Signed   By: Titus Dubin M.D.   On: 06/21/2022 16:48    EKG: Independently reviewed.  Sinus rhythm rate 91, QTc 495.  QRS prolonged at 135.  Atypical LBBB.  Assessment/Plan Principal Problem:   Symptomatic anemia Active Problems:   Paroxysmal atrial fibrillation (HCC)   GI bleed   AVM (arteriovenous malformation) of small bowel, acquired with hemorrhage   Dyspnea  Assessment and Plan: * Symptomatic anemia Presents with dyspnea on exertion, hemoglobin 7.8, recent baseline 8-10.  History of AVMs.  On anticoagulation with Eliquis but denies recent melena hematochezia or hematemesis.  Last endoluminal evaluation- upper and small bowel endoscopy-11/2020-Dr. Jenetta Downer.  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 Treated with argon plasma coagulation (APC). - A few non-bleeding angiodysplastic lesions in the jejunum. Treated with argon beam Coagulation. -Transfuse 1 unit PRBC -IV Lasix 40 mg x 1 with transfusion -IV Protonix 40 daily -GI consult -Hold Eliquis for now -She is on octreotide, will resume  CKD (chronic kidney disease) stage 4, GFR 15-29 ml/min (HCC) Creatinine 2.29, at baseline 1.9-2.2. -IV Lasix 40 mg x 1 with transfusion, continue Lasix 40 mg daily in a.m.  Dyspnea Dyspnea on exertion likely secondary to anemia, cardiac murmur heard on exam, patient's not aware.  Troponin 14 > 17.  - TEE done 05/10/2021 for watchman evaluation-EF 60 to 65%, 80 minutes LV parameters, mild mitral regurgitation and mild mitral stenosis, severe mitral annular calcification, mild to moderate tricuspid valve regurgitation.  Moderate aortic valve calcification.  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 -Will repeat echo to rule out worsening valvular abnormality  GI bleed History of AVMs. -Resume octreotide  Essential hypertension Systolic 1 42-5 61. -Resume losartan, Lasix 40 daily  Paroxysmal atrial fibrillation (HCC) Stable, rate controlled and on chronic anticoagulation with Eliquis.   -Resume flecainide - Hold Eliquis pending GI eval     DVT prophylaxis: SCDs Code Status: Full Family Communication: At bedside Disposition Plan: ~ 1 - 2 days Consults called: GI  Admission status:  Obs tele   Author: Bethena Roys, MD 06/21/2022 10:55 PM  For on call review [email or web address].

## 2022-06-21 NOTE — Assessment & Plan Note (Signed)
Creatinine 2.29, at baseline 1.9-2.2. -IV Lasix 40 mg x 1 with transfusion, continue Lasix 40 mg daily in a.m.

## 2022-06-21 NOTE — Assessment & Plan Note (Addendum)
History of AVMs. -Resume octreotide

## 2022-06-21 NOTE — Assessment & Plan Note (Addendum)
Presents with dyspnea on exertion, hemoglobin 7.8, recent baseline 8-10.  History of AVMs.  On anticoagulation with Eliquis but denies recent melena hematochezia or hematemesis.  Last endoluminal evaluation- upper and small bowel endoscopy-11/2020-Dr. Jenetta Downer.  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 Treated with argon plasma coagulation (APC). - A few non-bleeding angiodysplastic lesions in the jejunum. Treated with argon beam Coagulation. -Transfuse 1 unit PRBC -IV Lasix 40 mg x 1 with transfusion -IV Protonix 40 daily -GI consult -Hold Eliquis for now -She is on octreotide, will resume

## 2022-06-21 NOTE — Assessment & Plan Note (Signed)
Systolic 1 19-4 61. -Resume losartan, Lasix 40 daily

## 2022-06-21 NOTE — ED Triage Notes (Signed)
Patient received via EMS for weakness and SOB. EMS reports pt is in AFIB. Patient called EMS funeral. Patient wheezing during movement.

## 2022-06-21 NOTE — Assessment & Plan Note (Signed)
Dyspnea on exertion likely secondary to anemia, cardiac murmur heard on exam, patient's not aware.  Troponin 14 > 17.  - TEE done 05/10/2021 for watchman evaluation-EF 60 to 65%, 80 minutes LV parameters, mild mitral regurgitation and mild mitral stenosis, severe mitral annular calcification, mild to moderate tricuspid valve regurgitation.  Moderate aortic valve calcification.  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 -Will repeat echo to rule out worsening valvular abnormality

## 2022-06-21 NOTE — ED Provider Notes (Signed)
[facility] Provider Note  CSN: [id number] Arrival date & time: 06/21/22 1546  Chief Complaint(s) Shortness of Breath and Weakness  HPI [name] [name] is a 76 y.o. female with PMH paroxysmal A-fib on Eliquis, HTN, HLD, CHF, peptic ulcer disease who presents emergency department for evaluation of shortness of breath and weakness.  Patient states that over the last 48 hours she has noticed significant worsening weakness and shortness of breath and states that she feels this way when she needs blood transfusion.  She denies associated chest pain, abdominal pain, nausea, vomiting, diarrhea or other systemic symptoms.   Past Medical History Past Medical History:  Diagnosis Date   Anemia    Chronic kidney disease    Constipation 07/17/2017   GERD (gastroesophageal reflux disease)    Gout    Grade II diastolic dysfunction 01/28/4269   Hyperlipidemia    Hypertension    Hypokalemia    OSA (obstructive sleep apnea) 08/09/2020   PAF (paroxysmal atrial fibrillation) (HCC)    PAF (paroxysmal atrial fibrillation) ([place])    PUD (peptic ulcer disease)    Syncope    Neurocardiogenic   Patient Active Problem List   Diagnosis Date Noted   IBS (irritable bowel syndrome) 05/30/2022   AVM (arteriovenous malformation) of small bowel, 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/2021   Upper GI bleed    Dry gangrene ([place])    GI bleed 12/05/2020   Acute blood loss anemia 11/24/2020   Heme positive stool    Iron deficiency anemia due to chronic blood loss    Symptomatic anemia 11/23/2020   Grade II diastolic dysfunction 62/37/6283   Hyponatremia 11/23/2020   Symptomatic bradycardia 10/20/2020   AKI (acute kidney injury) ([place]) 10/20/2020   Microcytic anemia 10/20/2020   OSA (obstructive sleep apnea) 08/09/2020   Paroxysmal atrial fibrillation (HCC) 06/15/2020   Bradycardia with 31-40 beats per minute 06/15/2020   Snoring 06/15/2020   Intermittent palpitations 06/15/2020   Near syncope  06/15/2020   Hypertension, uncontrolled 06/15/2020   Atrial fibrillation with RVR ([place]) 05/04/2020   Hyperlipidemia    Gout    Encounter for screening colonoscopy    Hemorrhoid 12/16/2018   GERD (gastroesophageal reflux disease) 01/29/2018   Constipation 07/17/2017   Rectal bleeding 01/30/2017   Chest pain 12/09/2013   Hypokalemia 05/01/2012   Renal insufficiency 05/01/2012   Essential hypertension, benign 05/03/2010   VASOVAGAL SYNCOPE 05/03/2010   Home Medication(s) Prior to Admission medications   Medication Sig Start Date End Date Taking? Authorizing Provider  acetaminophen (TYLENOL) 500 MG tablet Take 1,000 mg by mouth every 6 (six) hours as needed for moderate pain.    [provider]  albuterol (VENTOLIN HFA) 108 (90 Base) MCG/ACT inhaler Inhale 1-2 puffs into the lungs every 6 (six) hours as needed for wheezing or shortness of breath.  09/23/20   [provider]  calcitRIOL (ROCALTROL) 0.25 MCG capsule Take 0.25 mcg by mouth 3 (three) times a week. Monday,Wednesday and Fridays 04/18/22   [provider]  Cholecalciferol (VITAMIN D-3) 25 MCG (1000 UT) CAPS Take 1,000 Units by mouth daily.    [provider]  cyclobenzaprine (FLEXERIL) 5 MG tablet Take 5 mg by mouth 3 (three) times daily as needed for muscle spasms. 06/19/20   [provider]  diclofenac Sodium (VOLTAREN) 1 % GEL Apply 1 application topically 4 (four) times daily as needed (pain).    [provider]  ELIQUIS 5 MG TABS tablet TAKE 1 TABLET BY MOUTH TWICE A DAY  06/13/22   Evans Lance, MD  flecainide (TAMBOCOR) 50 MG tablet TAKE 1 TABLET BY MOUTH TWICE A DAY 06/06/22   Evans Lance, MD  furosemide (LASIX) 40 MG tablet Take 1 tablet (40 mg total) by mouth daily. Patient taking differently: Take 40 mg by mouth See admin instructions. Take 40 mg in the morning and 20 mg every evening 03/29/22   Evans Lance, MD  hydrocortisone (ANUSOL-HC) 2.5 % rectal cream Place 1  application rectally 2 (two) times daily. 03/14/22   Eloise Harman, DO  losartan (COZAAR) 50 MG tablet Take 1 tablet (50 mg total) by mouth daily. 11/08/21   Evans Lance, MD  lubiprostone (AMITIZA) 24 MCG capsule Take 1 capsule (24 mcg total) by mouth in the morning and at bedtime. 12/06/21 12/06/22  Eloise Harman, DO  octreotide (SANDOSTATIN) 100 MCG/ML SOLN injection Inject 1 mL (100 mcg total) into the skin every 12 (twelve) hours. 05/17/22 05/17/23  Eloise Harman, DO  pantoprazole (PROTONIX) 40 MG tablet TAKE ONE TABLET BY MOUTH TWICE DAILY BEFORE A MEAL Patient taking differently: Take 40 mg by mouth 2 (two) times daily. 04/16/22   Fayrene Helper, MD  potassium chloride SA (KLOR-CON) 20 MEQ tablet Take 20 mEq by mouth in the morning, at noon, and at bedtime.    [provider]  rosuvastatin (CRESTOR) 10 MG tablet TAKE 1 TABLET BY MOUTH EVERY DAY 08/21/21   Fayrene Helper, MD                                                                                                                                    Past Surgical History Past Surgical History:  Procedure Laterality Date   ABDOMINAL AORTOGRAM W/LOWER EXTREMITY Right 12/13/2020   Procedure: ABDOMINAL AORTOGRAM W/LOWER EXTREMITY;  Surgeon: Serafina Mitchell, MD;  Location: [facility];  Service: Cardiovascular;  Laterality: Right;   ABDOMINAL HYSTERECTOMY     AMPUTATION Right 12/14/2020   Procedure: Right fifth toe amputation;  Surgeon: Rosetta Posner, MD;  Location: [facility] OR;  Service: Vascular;  Laterality: Right;   CATARACT EXTRACTION Bilateral    COLONOSCOPY N/A 10/07/2014   Dr. Oneida Alar: redundant left colon, moderate sized external hemorrhoids    COLONOSCOPY N/A 03/11/2020   Surgeon: Danie Binder, MD;  9 polyps, majority of which were tubular adenomas, nodular mucosa in the anus s/p biopsy which was benign.   ENTEROSCOPY N/A 11/27/2020   Surgeon: Montez Morita, Quillian Quince, MD; multiple nonbleeding  AVMs in the duodenum s/p APC therapy, few nonbleeding AVMs in jejunum s/p argon beam coagulation.   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    HOT  HEMOSTASIS  11/27/2020   Procedure: HOT HEMOSTASIS (ARGON PLASMA COAGULATION/BICAP);  Surgeon: Montez Morita, Quillian Quince, MD;  Location: [facility];  Service: Gastroenterology;;   LIPOMA RESECTION     PERIPHERAL VASCULAR INTERVENTION Right 12/13/2020   Procedure: PERIPHERAL VASCULAR INTERVENTION;  Surgeon: Serafina Mitchell, MD;  Location: [facility];  Service: Cardiovascular;  Laterality: Right;  SFA/PT/AT   POLYPECTOMY  03/11/2020   Procedure: POLYPECTOMY;  Surgeon: Danie Binder, MD;  Location: [facility];  Service: Endoscopy;;  cecal, transverse, descending, sigmoid   TEE WITHOUT CARDIOVERSION N/A 05/10/2021   Procedure: TRANSESOPHAGEAL ECHOCARDIOGRAM (TEE) WATCHMEN EVALUATION;  Surgeon: Josue Hector, MD;  Location: [facility];  Service: Cardiovascular;  Laterality: N/A;   Family History Family History  Problem Relation Age of Onset   Stroke Mother    Dementia Father    Cancer - Other Sister    Atrial fibrillation Sister    Heart failure Sister    Colon cancer Maternal Grandmother        diagnosed in her 74s    Social History Social History   Tobacco Use   Smoking status: Former    Packs/day: 1.00    Years: 20.00    Total pack years: 20.00    Types: Cigarettes    Start date: 03/16/1964    Quit date: 10/07/1997    Years since quitting: 24.7   Smokeless tobacco: Never  Vaping Use   Vaping Use: Never used  Substance Use Topics   Alcohol use: No    Alcohol/week: 0.0 standard drinks of alcohol   Drug use: No   Allergies Amlodipine, Amoxicillin, Doxycycline,  Acetaminophen-codeine, Allopurinol, and Tramadol  Review of Systems Review of Systems  Constitutional:  Positive for fatigue.  Respiratory:  Positive for shortness of breath.   Neurological:  Positive for weakness.    Physical Exam Vital Signs  I have reviewed the triage vital signs BP (!) 119/43   Pulse 72   Temp 98.1 F (36.7 C) (Oral)   Resp 17   Ht '5\' 2"'$  (1.575 m)   Wt 79.4 kg   SpO2 100%   BMI 32.01 kg/m   Physical Exam Vitals and nursing note reviewed.  Constitutional:      General: She is not in acute distress.    Appearance: She is well-developed.  HENT:     Head: Normocephalic and atraumatic.  Eyes:     Conjunctiva/sclera: Conjunctivae normal.  Cardiovascular:     Rate and Rhythm: Normal rate and regular rhythm.     Heart sounds: Murmur heard.  Pulmonary:     Effort: Pulmonary effort is normal. No respiratory distress.     Breath sounds: Normal breath sounds.  Abdominal:     Palpations: Abdomen is soft.     Tenderness: There is no abdominal tenderness.  Musculoskeletal:        General: No swelling.     Cervical back: Neck supple.  Skin:    General: Skin is warm and dry.     Capillary Refill: Capillary refill takes less than 2 seconds.  Neurological:     Mental Status: She is alert.  Psychiatric:        Mood and Affect: Mood normal.     ED Results and Treatments Labs (all labs ordered are listed, but only abnormal results are displayed) Labs Reviewed  COMPREHENSIVE METABOLIC PANEL - Abnormal; Notable for the following components:      Result Value   Glucose, Bld 121 (*)  BUN 33 (*)    Creatinine, Ser 2.29 (*)    GFR, Estimated 22 (*)    All other components within normal limits  CBC WITH DIFFERENTIAL/PLATELET - Abnormal; Notable for the following components:   RBC 3.05 (*)    Hemoglobin 7.8 (*)    HCT 27.3 (*)    MCH 25.6 (*)    MCHC 28.6 (*)    RDW 16.0 (*)    All other components within normal limits  BRAIN NATRIURETIC PEPTIDE -  Abnormal; Notable for the following components:   B Natriuretic Peptide 171.0 (*)    All other components within normal limits  TYPE AND SCREEN  PREPARE RBC (CROSSMATCH)  TROPONIN I (HIGH SENSITIVITY)  TROPONIN I (HIGH SENSITIVITY)                                                                                                                          Radiology DG Chest 2 View  Result Date: 06/21/2022 CLINICAL DATA:  Dyspnea. EXAM: CHEST - 2 VIEW COMPARISON:  Chest x-ray dated December 05, 2020. FINDINGS: The heart size and mediastinal contours are within normal limits. Both lungs are clear. The visualized skeletal structures are unremarkable. IMPRESSION: No active cardiopulmonary disease. Electronically Signed   By: Titus Dubin M.D.   On: 06/21/2022 16:48    Pertinent labs & imaging results that were available during my care of the patient were reviewed by me and considered in my medical decision making (see MDM for details).  Medications Ordered in ED Medications  0.9 %  sodium chloride infusion (Manually program via Guardrails IV Fluids) (has no administration in time range)                                                                                                                                     Procedures .Critical Care  Performed by: Teressa Lower, MD Authorized by: Teressa Lower, MD   Critical care provider statement:    Critical care time (minutes):  30   Critical care was necessary to treat or prevent imminent or life-threatening deterioration of the following conditions: Symptomatic anemia requiring blood cell transfusion.   Critical care was time spent personally by me on the following activities:  Development of treatment plan with patient or surrogate, discussions with consultants, evaluation of patient's response to treatment, examination of patient, ordering and review of laboratory studies, ordering and review  of radiographic studies, ordering and  performing treatments and interventions, pulse oximetry, re-evaluation of patient's condition and review of old charts   (including critical care time)  Medical Decision Making / ED Course   This patient presents to the ED for concern of shortness of breath, this involves an extensive number of treatment options, and is a complaint that carries with it a high risk of complications and morbidity.  The differential diagnosis includes symptomatic anemia, aortic stenosis, CHF exacerbation, COPD exacerbation, arrhythmia  MDM: Patient seen emergency room for evaluation of multiple complaints described above.  Physical exam with a loud holosystolic murmur concerning for aortic stenosis.  I do not see documented murmur and previous cardiology notes but as the patient is not in severe respiratory distress, I have overall low suspicion that this is truly acute finding, but instead may be starting to become symptomatic.  Laboratory evaluation with a hemoglobin of 7.8, creatinine stable at 2.29 with a BUN of 33, BNP elevated to 171 with normal troponin.  Did to perform a walk of life here in the emergency department and although the patient did not drop her oxygen saturations she was significantly fatigued with shortness of breath on this short walk which she says is abnormal for her.  I consulted cardiology who states that a repeat echo would be warranted and they can be consulted routinely while in the hospital.  1 unit packed red blood cells ordered given patient's concern for symptomatic anemia.  Patient then admitted for observation and echo.   Additional history obtained: -Additional history obtained from multiple family members -External records from outside source obtained and reviewed including: Chart review including previous notes, labs, imaging, consultation notes   Lab Tests: -I ordered, reviewed, and interpreted labs.   The pertinent results include:   Labs Reviewed  COMPREHENSIVE METABOLIC  PANEL - Abnormal; Notable for the following components:      Result Value   Glucose, Bld 121 (*)    BUN 33 (*)    Creatinine, Ser 2.29 (*)    GFR, Estimated 22 (*)    All other components within normal limits  CBC WITH DIFFERENTIAL/PLATELET - Abnormal; Notable for the following components:   RBC 3.05 (*)    Hemoglobin 7.8 (*)    HCT 27.3 (*)    MCH 25.6 (*)    MCHC 28.6 (*)    RDW 16.0 (*)    All other components within normal limits  BRAIN NATRIURETIC PEPTIDE - Abnormal; Notable for the following components:   B Natriuretic Peptide 171.0 (*)    All other components within normal limits  TYPE AND SCREEN  PREPARE RBC (CROSSMATCH)  TROPONIN I (HIGH SENSITIVITY)  TROPONIN I (HIGH SENSITIVITY)      EKG   EKG Interpretation  Date/Time:  Thursday June 21 2022 16:00:09 EDT Ventricular Rate:  91 PR Interval:  193 QRS Duration: 135 QT Interval:  402 QTC Calculation: 495 R Axis:   -1 Text Interpretation: Sinus rhythm IVCD, consider atypical LBBB No significant change since last tracing Confirmed by [place] ([id number]) on 06/21/2022 5:36:46 PM         Imaging Studies ordered: I ordered imaging studies including chest x-ray I independently visualized and interpreted imaging. I agree with the radiologist interpretation   Medicines ordered and prescription drug management: Meds ordered this encounter  Medications   0.9 %  sodium chloride infusion (Manually program via Guardrails IV Fluids)    -I have reviewed the patients home medicines and have  made adjustments as needed  Critical interventions Packed red blood cell administration  Consultations Obtained: I requested consultation with the cardiologist,  and discussed lab and imaging findings as well as pertinent plan - they recommend: Repeat echo   Cardiac Monitoring: The patient was maintained on a cardiac monitor.  I personally viewed and interpreted the cardiac monitored which showed an underlying rhythm of:  NSR  Social Determinants of Health:  Factors impacting patients care include: none   Reevaluation: After the interventions noted above, I reevaluated the patient and found that they have :stayed the same  Co morbidities that complicate the patient evaluation  Past Medical History:  Diagnosis Date   Anemia    Chronic kidney disease    Constipation 07/17/2017   GERD (gastroesophageal reflux disease)    Gout    Grade II diastolic dysfunction 79/0/2409   Hyperlipidemia    Hypertension    Hypokalemia    OSA (obstructive sleep apnea) 08/09/2020   PAF (paroxysmal atrial fibrillation) (HCC)    PAF (paroxysmal atrial fibrillation) ([place])    PUD (peptic ulcer disease)    Syncope    Neurocardiogenic      Dispostion: I considered admission for this patient, and given persistent shortness of breath on exertion and need for repeat echo, patient will be admitted     Final Clinical Impression(s) / ED Diagnoses Final diagnoses:  None     '@PCDICTATION'$ @    Teressa Lower, MD 06/21/22 1905

## 2022-06-21 NOTE — ED Notes (Addendum)
Pts SP02 while ambulating was 100%, but complained of shortness of breath and weakness. Nurse and MD notified.

## 2022-06-21 NOTE — Assessment & Plan Note (Addendum)
Stable, rate controlled and on chronic anticoagulation with Eliquis.   -Resume flecainide - Hold Eliquis pending GI eval

## 2022-06-22 ENCOUNTER — Telehealth: Payer: Self-pay | Admitting: Gastroenterology

## 2022-06-22 ENCOUNTER — Observation Stay (HOSPITAL_BASED_OUTPATIENT_CLINIC_OR_DEPARTMENT_OTHER): Payer: Medicare HMO

## 2022-06-22 DIAGNOSIS — Z87891 Personal history of nicotine dependence: Secondary | ICD-10-CM | POA: Diagnosis not present

## 2022-06-22 DIAGNOSIS — K5521 Angiodysplasia of colon with hemorrhage: Secondary | ICD-10-CM | POA: Diagnosis not present

## 2022-06-22 DIAGNOSIS — I480 Paroxysmal atrial fibrillation: Secondary | ICD-10-CM | POA: Diagnosis not present

## 2022-06-22 DIAGNOSIS — R0602 Shortness of breath: Secondary | ICD-10-CM

## 2022-06-22 DIAGNOSIS — Z89421 Acquired absence of other right toe(s): Secondary | ICD-10-CM | POA: Diagnosis not present

## 2022-06-22 DIAGNOSIS — D509 Iron deficiency anemia, unspecified: Secondary | ICD-10-CM | POA: Diagnosis not present

## 2022-06-22 DIAGNOSIS — R011 Cardiac murmur, unspecified: Secondary | ICD-10-CM | POA: Diagnosis not present

## 2022-06-22 DIAGNOSIS — D649 Anemia, unspecified: Secondary | ICD-10-CM | POA: Diagnosis not present

## 2022-06-22 LAB — BASIC METABOLIC PANEL
Anion gap: 7 (ref 5–15)
BUN: 31 mg/dL — ABNORMAL HIGH (ref 8–23)
CO2: 27 mmol/L (ref 22–32)
Calcium: 8.7 mg/dL — ABNORMAL LOW (ref 8.9–10.3)
Chloride: 106 mmol/L (ref 98–111)
Creatinine, Ser: 2.24 mg/dL — ABNORMAL HIGH (ref 0.44–1.00)
GFR, Estimated: 22 mL/min — ABNORMAL LOW (ref >60)
Glucose, Bld: 98 mg/dL (ref 70–99)
Potassium: 3.5 mmol/L (ref 3.5–5.1)
Sodium: 140 mmol/L (ref 135–145)

## 2022-06-22 LAB — ECHOCARDIOGRAM COMPLETE
AR max vel: 2.22 cm2
AV Area VTI: 1.94 cm2
AV Area mean vel: 2.05 cm2
AV Mean grad: 9 mmHg
AV Peak grad: 20.3 mmHg
Ao pk vel: 2.25 m/s
Area-P 1/2: 1.78 cm2
Calc EF: 78.9 %
Height: 62 in
MV VTI: 1.82 cm2
S' Lateral: 2.1 cm
Single Plane A2C EF: 74.3 %
Single Plane A4C EF: 83.8 %
Weight: 2821.89 oz

## 2022-06-22 LAB — BPAM RBC
Blood Product Expiration Date: 202307272359
ISSUE DATE / TIME: 202306292029
Unit Type and Rh: 5100

## 2022-06-22 LAB — CBC
HCT: 27.2 % — ABNORMAL LOW (ref 36.0–46.0)
Hemoglobin: 8.2 g/dL — ABNORMAL LOW (ref 12.0–15.0)
MCH: 26.6 pg (ref 26.0–34.0)
MCHC: 30.1 g/dL (ref 30.0–36.0)
MCV: 88.3 fL (ref 80.0–100.0)
Platelets: 327 10*3/uL (ref 150–400)
RBC: 3.08 MIL/uL — ABNORMAL LOW (ref 3.87–5.11)
RDW: 15.5 % (ref 11.5–15.5)
WBC: 6.7 10*3/uL (ref 4.0–10.5)
nRBC: 0 % (ref 0.0–0.2)

## 2022-06-22 LAB — TYPE AND SCREEN
ABO/RH(D): O POS
Antibody Screen: NEGATIVE
Unit division: 0

## 2022-06-22 LAB — FERRITIN: Ferritin: 12 ng/mL (ref 11–307)

## 2022-06-22 LAB — IRON AND TIBC
Iron: 21 ug/dL — ABNORMAL LOW (ref 28–170)
Saturation Ratios: 5 % — ABNORMAL LOW (ref 10.4–31.8)
TIBC: 441 ug/dL (ref 250–450)
UIBC: 420 ug/dL

## 2022-06-22 NOTE — Progress Notes (Signed)
Pt. cardiac monitor is alarming with extreme bradycardia with HR as low as 39 bpm. She was Sinus Rhythm at 1600 on 6/29. We did an EKG and its now showing Sinus Bradycardia with marked sinus arrhythmia with VR of 52 bpm. Pt. Is not symptomatic. MD notified with instructions to closely monitor pt.

## 2022-06-22 NOTE — Progress Notes (Signed)
*  PRELIMINARY RESULTS* Echocardiogram 2D Echocardiogram has been performed.  [name] [name] 06/22/2022, 2:07 PM

## 2022-06-22 NOTE — Telephone Encounter (Signed)
Clinical Pool - Please send referral to [facility] for double balloon enteroscopy due to IDA in the setting of known AVMs, she has had procedure with them in 2022.   [facility] - In [name] [name]'s absent please arrange H/H in 1 week for patient.   [facility] - hospital follow up in about 4-6 weeks.   Venetia Night, MSN, FNP-BC, AGACNP-BC [facility]

## 2022-06-22 NOTE — Consult Note (Addendum)
Gastroenterology Consult   Referring Provider: No ref. provider found Primary Care Physician:  Andres Shad, MD Primary Gastroenterologist:  Dr. Abbey Chatters  Patient ID: [name] [name]; [id number]; 03-27-1946   Admit date: 06/21/2022  LOS: 0 days   Date of Consultation: 06/22/2022  Reason for Consultation:  symptomatic anemia, known small bowel AVMs  History of Present Illness   [name] [name] is a 76 y.o. year old female with history of GI bleeding secondary to AVMs, Afib on Eliquis, HTN, OSA, IDA, and CKD. She presented to the hospital with weakness and worsening dyspnea on exertion. Hgb revealed to be 7.8. GI consulted for symptomatic anemia.   ED Course: VSS, HR 46/86. Hgb 7.8, MCV 89 Received 1 unit PRBC. Has known cardiac murmur with TEE in May 2022 with EF 60-65%, mild MR and MS, mild TR. She will be getting repeat Echo today.    Consult: Dyspnea on exertion began worsening yesterday, has baseline SOB due to Afib. Maintained on Eliquis, Denies nay recent melena or hematochezia, last time she noticed some blood was a little over a month ago, does have hemorrhoids and notices blood due to those from time time as well. She states she knows when she has been bleeding due to her shortness of breath. She denies any syncope episodes. Has been doing well with bowel movements since being on the Amitiza. Currently feeling better post blood transfusion. She is agreeable to [facility] for [facility] procedure  Is supposed to see hematology 7/10 to discuss IV iron infusions. Has been getting her Hgb checked every 2 weeks.    She has had prior GI workup consisting of: EGD and small bowel enteroscopy 11/2020 with Dr. 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   Antegrade double balloon enteroscopy with [facility] on 05/19/21: - Normal esophagus. - Normal stomach. - Sixteen non-bleeding angioectasias in the duodenum.  Treated with argon plasma coagulation (APC). - Six non-bleeding angioectasias in the jejunum.  Treated with argon plasma coagulation (APC). - Five non-bleeding angioectasias in the jejunum.  Treated with argon plasma coagulation (APC). - The examined portion of the ileum was normal.  Tattooed.   Past Medical History:  Diagnosis Date   Anemia    Chronic kidney disease    Constipation 07/17/2017   GERD (gastroesophageal reflux disease)    Gout    Grade II diastolic dysfunction 27/01/5365   Hyperlipidemia    Hypertension    Hypokalemia    OSA (obstructive sleep apnea) 08/09/2020   PAF (paroxysmal atrial fibrillation) (HCC)    PAF (paroxysmal atrial fibrillation) (HCC)    PUD (peptic ulcer disease)    Syncope    Neurocardiogenic    Past Surgical History:  Procedure Laterality Date   ABDOMINAL AORTOGRAM W/LOWER EXTREMITY Right 12/13/2020   Procedure: ABDOMINAL AORTOGRAM W/LOWER EXTREMITY;  Surgeon: Serafina Mitchell, MD;  Location: [facility];  Service: Cardiovascular;  Laterality: Right;   ABDOMINAL HYSTERECTOMY     AMPUTATION Right 12/14/2020   Procedure: Right fifth toe amputation;  Surgeon: Rosetta Posner, MD;  Location: [facility] OR;  Service: Vascular;  Laterality: Right;   CATARACT EXTRACTION Bilateral    COLONOSCOPY N/A 10/07/2014   Dr. Oneida Alar: redundant left colon, moderate sized external hemorrhoids  COLONOSCOPY N/A 03/11/2020   Surgeon: Danie Binder, MD;  9 polyps, majority of which were tubular adenomas, nodular mucosa in the anus s/p biopsy which was benign.   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    ESOPHAGOGASTRODUODENOSCOPY (EGD) WITH PROPOFOL N/A 11/25/2020    Surgeon: Daneil Dolin, MD; Normal, s/p capsule deployment   [place] N/A 11/25/2020    Surgeon: Daneil Dolin, MD; 4/nonbleeding AVMs in the proximal small bowel large fresh blood, 2 bleeding lymphangiectasis in SB.    HOT HEMOSTASIS  11/27/2020   Procedure: HOT HEMOSTASIS (ARGON PLASMA COAGULATION/BICAP);  Surgeon: Montez Morita, Quillian Quince, MD;  Location: [facility];  Service: Gastroenterology;;   LIPOMA RESECTION     PERIPHERAL VASCULAR INTERVENTION Right 12/13/2020   Procedure: PERIPHERAL VASCULAR INTERVENTION;  Surgeon: Serafina Mitchell, MD;  Location: [facility];  Service: Cardiovascular;  Laterality: Right;  SFA/PT/AT   POLYPECTOMY  03/11/2020   Procedure: POLYPECTOMY;  Surgeon: Danie Binder, MD;  Location: [facility];  Service: Endoscopy;;  cecal, transverse, descending, sigmoid   TEE WITHOUT CARDIOVERSION N/A 05/10/2021   Procedure: TRANSESOPHAGEAL ECHOCARDIOGRAM (TEE) WATCHMEN EVALUATION;  Surgeon: Josue Hector, MD;  Location: [facility] END[facility];  Service: Cardiovascular;  Laterality: N/A;    Prior to Admission medications   Medication Sig Start Date End Date Taking? Authorizing Provider  acetaminophen (TYLENOL) 500 MG tablet Take 1,000 mg by mouth every 6 (six) hours as needed for moderate pain.   Yes [provider]  albuterol (VENTOLIN HFA) 108 (90 Base) MCG/ACT inhaler Inhale 1-2 puffs into the lungs every 6 (six) hours as needed for wheezing or shortness of breath.  09/23/20  Yes [provider]  calcitRIOL (ROCALTROL) 0.25 MCG capsule Take 0.25 mcg by mouth 3 (three) times a week. Monday,Wednesday and Fridays 04/18/22  Yes [provider]  Cholecalciferol (VITAMIN D-3) 25 MCG (1000 UT) CAPS Take 1,000 Units by mouth daily.   Yes [provider]  cyclobenzaprine (FLEXERIL) 5 MG tablet Take 5 mg by mouth 3 (three) times daily as needed for muscle spasms.  06/19/20  Yes [provider]  diclofenac Sodium (VOLTAREN) 1 % GEL Apply 1 application topically 4 (four) times daily as needed (pain).   Yes [provider]  ELIQUIS 5 MG TABS tablet TAKE 1 TABLET BY MOUTH TWICE A DAY 06/13/22  Yes Evans Lance, MD  flecainide (TAMBOCOR) 50 MG tablet TAKE 1 TABLET BY MOUTH TWICE A DAY 06/06/22  Yes Evans Lance, MD  furosemide (LASIX) 40 MG tablet Take 1 tablet (40 mg total) by mouth daily. Patient taking differently: Take 40 mg by mouth See admin instructions. Take 40 mg in the morning and 20 mg every evening 03/29/22  Yes Evans Lance, MD  hydrocortisone (ANUSOL-HC) 2.5 % rectal cream Place 1 application rectally 2 (two) times daily. 03/14/22  Yes Carver, Charles K, DO  losartan (COZAAR) 50 MG tablet Take 1 tablet (50 mg total) by mouth daily. 11/08/21  Yes Evans Lance, MD  lubiprostone (AMITIZA) 24 MCG capsule Take 1 capsule (24 mcg total) by mouth in the morning and at bedtime. 12/06/21 12/06/22 Yes Carver, Elon Alas, DO  octreotide (SANDOSTATIN) 100 MCG/ML SOLN injection Inject 1 mL (100 mcg total) into the skin every 12 (twelve) hours. 05/17/22 05/17/23 Yes Carver, Charles K, DO  pantoprazole (PROTONIX) 40  MG tablet TAKE ONE TABLET BY MOUTH TWICE DAILY BEFORE A MEAL Patient taking differently: Take 40 mg by mouth daily. 04/16/22  Yes Fayrene Helper, MD  potassium chloride SA (KLOR-CON) 20 MEQ tablet Take 20 mEq by mouth in the morning, at noon, and at bedtime.   Yes [provider]  rosuvastatin (CRESTOR) 10 MG tablet TAKE 1 TABLET BY MOUTH EVERY DAY 08/21/21  Yes Fayrene Helper, MD  vitamin C (ASCORBIC ACID) 250 MG tablet Take 250 mg by mouth daily.   Yes [provider]    Current Facility-Administered Medications  Medication Dose Route Frequency Provider Last Rate Last Admin   acetaminophen (TYLENOL) tablet 650 mg  650 mg Oral Q6H PRN Emokpae, Ejiroghene E, MD       Or   acetaminophen (TYLENOL)  suppository 650 mg  650 mg Rectal Q6H PRN Emokpae, Ejiroghene E, MD       albuterol (PROVENTIL) (2.5 MG/3ML) 0.083% nebulizer solution 3 mL  3 mL Inhalation Q6H PRN Emokpae, Ejiroghene E, MD       calcitRIOL (ROCALTROL) capsule 0.25 mcg  0.25 mcg Oral Once per day on Mon Wed Fri Emokpae, Ejiroghene E, MD       flecainide (TAMBOCOR) tablet 50 mg  50 mg Oral BID Emokpae, Ejiroghene E, MD   50 mg at 06/22/22 0000   furosemide (LASIX) tablet 40 mg  40 mg Oral Daily Emokpae, Ejiroghene E, MD       losartan (COZAAR) tablet 50 mg  50 mg Oral Daily Emokpae, Ejiroghene E, MD       lubiprostone (AMITIZA) capsule 24 mcg  24 mcg Oral BID WC Emokpae, Ejiroghene E, MD       octreotide (SANDOSTATIN) injection 100 mcg  100 mcg Subcutaneous Q12H Emokpae, Ejiroghene E, MD       ondansetron (ZOFRAN) tablet 4 mg  4 mg Oral Q6H PRN Emokpae, Ejiroghene E, MD       Or   ondansetron (ZOFRAN) injection 4 mg  4 mg Intravenous Q6H PRN Emokpae, Ejiroghene E, MD       pantoprazole (PROTONIX) injection 40 mg  40 mg Intravenous QHS Emokpae, Ejiroghene E, MD   40 mg at 06/22/22 0000   polyethylene glycol (MIRALAX / GLYCOLAX) packet 17 g  17 g Oral Daily PRN Emokpae, Ejiroghene E, MD       potassium chloride SA (KLOR-CON M) CR tablet 20 mEq  20 mEq Oral BID Emokpae, Ejiroghene E, MD   20 mEq at 06/22/22 0000    Allergies as of 06/21/2022 - Review Complete 06/21/2022  Allergen Reaction Noted   Amlodipine  08/03/2020   Amoxicillin Hives 03/04/2020   Doxycycline  11/23/2020   Acetaminophen-codeine Hives and Rash 09/16/2020   Allopurinol Hives and Rash    Tramadol Hives and Rash     Family History  Problem Relation Age of Onset   Stroke Mother    Dementia Father    Cancer - Other Sister    Atrial fibrillation Sister    Heart failure Sister    Colon cancer Maternal Grandmother        diagnosed in her 42s    Social History   Socioeconomic History   Marital status: Single    Spouse name: Not on file   Number of  children: Not on file   Years of education: Not on file   Highest education level: Not on file  Occupational History   Occupation: Retired  Tobacco Use   Smoking status:  Former    Packs/day: 1.00    Years: 20.00    Total pack years: 20.00    Types: Cigarettes    Start date: 03/16/1964    Quit date: 10/07/1997    Years since quitting: 24.7   Smokeless tobacco: Never  Vaping Use   Vaping Use: Never used  Substance and Sexual Activity   Alcohol use: No    Alcohol/week: 0.0 standard drinks of alcohol   Drug use: No   Sexual activity: Yes    Partners: Male  Other Topics Concern   Not on file  Social History Narrative   Not on file   Social Determinants of Health   Financial Resource Strain: Not on file  Food Insecurity: Not on file  Transportation Needs: Not on file  Physical Activity: Not on file  Stress: Not on file  Social Connections: Not on file  Intimate Partner Violence: Not on file     Review of Systems   Gen: Denies any fever, chills, loss of appetite, change in weight or weight loss CV: Denies chest pain, heart palpitations, syncope, edema  Resp: Denies shortness of breath with rest, cough, wheezing, coughing up blood, and pleurisy. GI: Denies vomiting blood, jaundice, and fecal incontinence.   Denies dysphagia or odynophagia. GU : Denies urinary burning, blood in urine, urinary frequency, and urinary incontinence. MS: Denies joint pain, limitation of movement, swelling, cramps, and atrophy.  Derm: Denies rash, itching, dry skin, hives. Psych: Denies depression, anxiety, memory loss, hallucinations, and confusion. Heme: Denies bruising or bleeding Neuro:  Denies any headaches, dizziness, paresthesias, shaking  Physical Exam   Vital Signs in last 24 hours: Temp:  [97.8 F (36.6 C)-99.2 F (37.3 C)] 98.3 F (36.8 C) (06/30 0612) Pulse Rate:  [46-86] 58 (06/30 0624) Resp:  [15-20] 17 (06/30 0624) BP: (99-161)/(42-78) 100/52 (06/30 0624) SpO2:  [98 %-100  %] 100 % (06/30 0624) FiO2 (%):  [21 %] 21 % (06/29 2326) Weight:  [79.4 kg-80 kg] 80 kg (06/29 2238) Last BM Date : 06/20/22  General:   Alert,  Well-developed, well-nourished, pleasant and cooperative in NAD Head:  Normocephalic and atraumatic. Eyes:  Sclera clear, no icterus.   Conjunctiva pink. Ears:  Normal auditory acuity. Lungs:  Clear throughout to auscultation.   No wheezes, crackles, or rhonchi. No acute distress. Heart:  Regular rate and rhythm; no murmurs, clicks, rubs,  or gallops. Abdomen:  Soft, nontender and nondistended. No masses, hepatosplenomegaly or hernias noted. Normal bowel sounds, without guarding, and without rebound.   Rectal: deferred Msk:  Symmetrical without gross deformities. Normal posture. Extremities:  Without clubbing or edema. Neurologic:  Alert and  oriented x4. Skin:  Intact without significant lesions or rashes. Psych:  Alert and cooperative. Normal mood and affect.  Intake/Output from previous day: 06/29 0701 - 06/30 0700 In: 328.3 [Blood:328.3] Out: -  Intake/Output this shift: No intake/output data recorded.   Labs/Studies   Recent Labs Recent Labs    06/21/22 1602 06/22/22 0505  WBC 9.3 6.7  HGB 7.8* 8.2*  HCT 27.3* 27.2*  PLT 384 327   BMET Recent Labs    06/21/22 1602 06/22/22 0505  NA 136 140  K 3.5 3.5  CL 100 106  CO2 25 27  GLUCOSE 121* 98  BUN 33* 31*  CREATININE 2.29* 2.24*  CALCIUM 8.9 8.7*   LFT Recent Labs    06/21/22 1602  PROT 7.2  ALBUMIN 4.3  AST 21  ALT 12  ALKPHOS 59  BILITOT 0.7  PT/INR No results for input(s): "LABPROT", "INR" in the last 72 hours. Hepatitis Panel No results for input(s): "HEPBSAG", "HCVAB", "HEPAIGM", "HEPBIGM" in the last 72 hours. C-Diff No results for input(s): "CDIFFTOX" in the last 72 hours.  Radiology/Studies DG Chest 2 View  Result Date: 06/21/2022 CLINICAL DATA:  Dyspnea. EXAM: CHEST - 2 VIEW COMPARISON:  Chest x-ray dated December 05, 2020. FINDINGS: The  heart size and mediastinal contours are within normal limits. Both lungs are clear. The visualized skeletal structures are unremarkable. IMPRESSION: No active cardiopulmonary disease. Electronically Signed   By: Titus Dubin M.D.   On: 06/21/2022 16:48     Assessment   SHAKIMA NISLEY is a 76 y.o. year old female with history of GI bleeding secondary to AVMs, Afib on Eliquis, HTN, OSA, IDA, and CKD. She presented to the hospital with weakness and worsening dyspnea on exertion. Hgb revealed to be 7.8. GI consulted for symptomatic anemia.    Symptomatic Anemia/known small bowel AVMs: History of IDA secondary to known small bowel AVMs. She has been maintained on subcutaneous octreotide outpatient. Last seen by Dr. Abbey Chatters on 05/30/22 in the office. She has continued to deny any BRBPR or melena. Hgb has been drifting over the last month. Was 10.4 on 6/6 and has dropped to 8.1 on 6/20, admission 7.8, and slightly increased to 8.2. Iron panel 6/6 with Iron 36 and saturation 8% , TIBC 478, she had normal iron panel 3 months ago. Iron panel added to morning labs with Iron 21, TIBC 441, Saturation 5%, and ferritin 12. Last endoscopic evaluation with [facility] in May 2022 with double balloon enteroscopy with many AVMs found throughout the duodenum and jejunum treated with APC therapy. She has had intermittent blood in her stools due to hemorrhoids and last noticed melena or BRBPR about 1 months ago. She has been getting her Hgb checked every 2 weeks and has noticed it dropping and became significantly more short of breath yesterday so she presented to the ED. She would benefit from referral back to [facility] for double balloon evaluation and intervention which she is agreeable. In the meantime recommend conservative treatment with close monitoring of H/H, continue octreotide injections, and as needed blood transfusions/iron infusions. She has consultation with hematology on 7/13 to discuss iron infusions.  Constipation:  Controlled on Amitiza. Continue this.   Plan / Recommendations   Iron panel today Advance diet  Continue to monitor H/H, transfuse for Hgb <7 Supportive treatment, follow up with hematology on 7/10 to discuss iron infusions.  Continue Octreotide 100 mcg SubQ BID Needs referral back to [facility] for double balloon enteroscopy May resume Eliquis tomorrow Continue Amitiza   06/22/2022, 8:48 AM  Venetia Night, MSN, FNP-BC, AGACNP-BC [facility]

## 2022-06-22 NOTE — Care Management Obs Status (Signed)
[facility] NOTIFICATION   Patient Details  Name: [name] [name] MRN: [id number] Date of Birth: 1946-11-04   Medicare Observation Status Notification Given:  Yes    Tommy Medal 06/22/2022, 2:59 PM

## 2022-06-22 NOTE — Progress Notes (Signed)
PROGRESS NOTE     [name] [name], is a 76 y.o. female, DOB - [birth year], ZHY:[id number]  Admit date - 06/21/2022   Admitting Physician Ejiroghene Arlyce Dice, MD  Outpatient Primary MD for the patient is Andres Shad, MD  LOS - 0  Chief Complaint  Patient presents with   Shortness of Breath   Weakness        Brief Narrative:  76 y.o. 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 Jenetta Downer.  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 Treated with argon plasma coagulation (APC). - A few non-bleeding angiodysplastic lesions in the jejunum. Treated with argon beam Coagulation. -GI consult appreciated -Patient received 1 unit of PRBC -Continue Protonix and octreotide -GI recommends eventual follow-up with [facility] service for possible double-balloon enteroscopy -Continue to hold Eliquis -Dyspnea at rest improved dyspnea on exertion persist -Start on liquid diet if tolerating advance diet -Possible discharge on 06/23/2022 if H&H stable and tolerating diet  CKD (chronic kidney disease) stage 4, GFR 15-29 ml/min (HCC) Creatinine 2.29, at baseline 1.9-2.2. -Monitor renal function closely avoid hypotension and nephrotoxic agents  HFpEF--Echo from 06/22/2022 with EF 70 to 29%, grade 2 diastolic dysfunction, moderate pulmonary hypertension -Moderately calcified and sclerotic aortic valve noted, no regarding -Moderate to severe mitral annular calcification with or without mitral regurg Dyspnea on exertion likely secondary to anemia, cardiac murmur heard on exam, patient's not aware.  Troponin 14 > 17.  - TEE done 05/10/2021 for watchman  evaluation-EF 60 to 65%, 80 minutes LV parameters, mild mitral regurgitation and mild mitral stenosis, severe mitral annular calcification, mild to moderate tricuspid valve regurgitation.  Moderate aortic valve calcification.  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 -Will repeat echo to rule out worsening valvular abnormality  Essential hypertension Systolic 1 52-8 61. -Resume losartan, Lasix 40 daily  Paroxysmal atrial fibrillation (HCC) Stable, rate controlled and on chronic anticoagulation with Eliquis.   -Resume flecainide -Restart Eliquis in a.m.   Disposition/Need for in-Hospital Stay- patient unable to be discharged at this time due to anticipated discharge home on 06/23/2022 with outpatient [facility] follow-up if H&H is stable and no further bleeding   Disposition: The patient is from: Home              Anticipated d/c is to: Home              Anticipated d/c date is: 1 day              Patient currently is not medically stable to d/c. Barriers: Not Clinically Stable-   Code Status :  -  Code Status: Full Code   Family Communication:    NA (patient is alert, awake and coherent)   DVT Prophylaxis  :   - SCDs  SCDs Start: 06/21/22 2326   Lab Results  Component Value Date   PLT 327 06/22/2022    Inpatient Medications  Scheduled Meds:  calcitRIOL  0.25 mcg Oral Once per day on Mon Wed Fri   flecainide  50 mg Oral BID   furosemide  40 mg Oral Daily   losartan  50 mg Oral Daily   lubiprostone  24 mcg Oral BID WC   octreotide  100 mcg Subcutaneous Q12H  pantoprazole (PROTONIX) IV  40 mg Intravenous QHS   potassium chloride SA  20 mEq Oral BID   Continuous Infusions: PRN Meds:.acetaminophen **OR** acetaminophen, albuterol, ondansetron **OR** ondansetron (ZOFRAN) IV, polyethylene glycol   Anti-infectives (From admission, onward)    None         Subjective: Lillia Corporal today has no fevers, no emesis,  No chest pain,   -Dyspnea at rest  improved -Dyspnea on exertion persist -No BM today -Willing to try liquid diet   Objective: Vitals:   06/22/22 0739 06/22/22 1045 06/22/22 1108 06/22/22 1401  BP:  (!) 134/53 139/68 (!) 123/48  Pulse:  62 60 61  Resp:  _0 Temp:  98.4 F (36.9 C) 98.8 F (37.1 C) 98.5 F (36.9 C)  TempSrc:  Oral Oral Oral  SpO2:  100% 100% 98%  Weight:      Height: _1  (1.575 m)       Intake/Output Summary (Last 24 hours) at 06/22/2022 1854 Last data filed at 06/22/2022 1330 Gross per 24 hour  Intake 808.33 ml  Output --  Net 808.33 ml   Filed Weights   06/21/22 1602 06/21/22 2238  Weight: 79.4 kg 80 kg    Physical Exam  Gen:- Awake Alert,  in no apparent distress  HEENT:- [place].AT, No sclera icterus Neck-Supple Neck,No JVD,.  Lungs-  CTAB , fair symmetrical air movement CV- S1, S2 normal, regular  Abd-  +ve B.Sounds, Abd Soft, No tenderness,    Extremity/Skin:- No  edema, pedal pulses present Psych-affect is appropriate, oriented x3 Neuro-generalized weakness, no new focal deficits, no tremors  Data Reviewed: I have personally reviewed following labs and imaging studies  CBC: Recent Labs  Lab 06/21/22 1602 06/22/22 0505  WBC 9.3 6.7  NEUTROABS 6.1  --   HGB 7.8* 8.2*  HCT 27.3* 27.2*  MCV 89.5 88.3  PLT 384 562   Basic Metabolic Panel: Recent Labs  Lab 06/21/22 1602 06/22/22 0505  NA 136 140  K 3.5 3.5  CL 100 106  CO2 25 27  GLUCOSE 121* 98  BUN 33* 31*  CREATININE 2.29* 2.24*  CALCIUM 8.9 8.7*   GFR: Estimated Creatinine Clearance: 20.9 mL/min (A) (by C-G formula based on SCr of 2.24 mg/dL (H)). Liver Function Tests: Recent Labs  Lab 06/21/22 1602  AST 21  ALT 12  ALKPHOS 59  BILITOT 0.7  PROT 7.2  ALBUMIN 4.3   Cardiac Enzymes: No results for input(s): "CKTOTAL", "CKMB", "CKMBINDEX", "TROPONINI" in the last 168 hours. BNP (last 3 results) No results for input(s): "PROBNP" in the last 8760 hours. HbA1C: No results for input(s): "HGBA1C"  in the last 72 hours. Sepsis Labs: _2 (procalcitonin:4,lacticidven:4) )No results found for this or any previous visit (from the past 240 hour(s)).    Radiology Studies: ECHOCARDIOGRAM COMPLETE  Result Date: 06/22/2022    ECHOCARDIOGRAM REPORT   Patient Name:   DAREN DOSWELL Mcfadden Date of Exam: 06/22/2022 Medical Rec #:  [id number]       Height:       62.0 in Accession #:    [id number]      Weight:       176.4 lb Date of Birth:  07/20/1946       BSA:          1.812 m Patient Age:    78 years        BP:           100/52 mmHg Patient Gender: F  HR:           64 bpm. Exam Location:  [facility] Procedure: 2D Echo, Cardiac Doppler and Color Doppler Indications:    Murmur  History:        Patient has prior history of Echocardiogram examinations, most                 recent 04/27/2021. Arrythmias:Atrial Fibrillation and Bradycardia,                 Signs/Symptoms:Syncope, Chest Pain and Dyspnea; Risk                 Factors:Hypertension and Dyslipidemia.  Sonographer:    Wenda Low Referring Phys: [place]  1. Left ventricular ejection fraction, by estimation, is 70 to 75%. The left ventricle has hyperdynamic function. The left ventricle has no regional wall motion abnormalities. There is severe concentric left ventricular hypertrophy. Left ventricular diastolic parameters are consistent with Grade II diastolic dysfunction (pseudonormalization). Small intracavitary gradient ~ 5 mm Hg.  2. Right ventricular systolic function is normal. The right ventricular size is normal. There is moderately elevated pulmonary artery systolic pressure. The estimated right ventricular systolic pressure is 55.9 mmHg.  3. Left atrial size was severely dilated.  4. A small pericardial effusion is present.  5. The mitral valve is degenerative. No evidence of mitral valve regurgitation. The mean mitral valve gradient is 3.0 mmHg with average heart rate of 61 bpm. Moderate to severe mitral  annular calcification.  6. Tricuspid valve regurgitation is mild to moderate.  7. The aortic valve is tricuspid. There is moderate calcification of the aortic valve. Aortic valve regurgitation is not visualized. Aortic valve sclerosis/calcification is present. Aortic valve mean gradient measures 9.0 mmHg. Aortic valve Vmax measures 2.25 m/s. Comparison(s): Slight worsening in RVSP from 2022. FINDINGS  Left Ventricle: Left ventricular ejection fraction, by estimation, is 70 to 75%. The left ventricle has hyperdynamic function. The left ventricle has no regional wall motion abnormalities. The left ventricular internal cavity size was normal in size. There is severe concentric left ventricular hypertrophy. Left ventricular diastolic parameters are consistent with Grade II diastolic dysfunction (pseudonormalization). Right Ventricle: The right ventricular size is normal. No increase in right ventricular wall thickness. Right ventricular systolic function is normal. There is moderately elevated pulmonary artery systolic pressure. The tricuspid regurgitant velocity is 3.53 m/s, and with an assumed right atrial pressure of 8 mmHg, the estimated right ventricular systolic pressure is 74.1 mmHg. Left Atrium: Left atrial size was severely dilated. Right Atrium: Right atrial size was normal in size. Pericardium: A small pericardial effusion is present. Mitral Valve: The mitral valve is degenerative in appearance. Moderate to severe mitral annular calcification. No evidence of mitral valve regurgitation. MV peak gradient, 8.4 mmHg. The mean mitral valve gradient is 3.0 mmHg with average heart rate of 61  bpm. Tricuspid Valve: The tricuspid valve is normal in structure. Tricuspid valve regurgitation is mild to moderate. No evidence of tricuspid stenosis. Aortic Valve: The aortic valve is tricuspid. There is moderate calcification of the aortic valve. Aortic valve regurgitation is not visualized. Aortic valve  sclerosis/calcification is present, without any evidence of aortic stenosis. Aortic valve mean gradient measures 9.0 mmHg. Aortic valve peak gradient measures 20.2 mmHg. Aortic valve area, by VTI measures 1.94 cm. Pulmonic Valve: The pulmonic valve was not well visualized. Pulmonic valve regurgitation is mild. No evidence of pulmonic stenosis. Aorta: The aortic root is normal in size and structure and  the ascending aorta was not well visualized. IAS/Shunts: No atrial level shunt detected by color flow Doppler.  LEFT VENTRICLE PLAX 2D LVIDd:         4.30 cm     Diastology LVIDs:         2.10 cm     LV e' medial:    4.79 cm/s LV PW:         1.50 cm     LV E/e' medial:  26.9 LV IVS:        1.40 cm     LV e' lateral:   8.05 cm/s LVOT diam:     1.90 cm     LV E/e' lateral: 16.0 LV SV:         97 LV SV Index:   53 LVOT Area:     2.84 cm  LV Volumes (MOD) LV vol d, MOD A2C: 59.6 ml LV vol d, MOD A4C: 42.7 ml LV vol s, MOD A2C: 15.3 ml LV vol s, MOD A4C: 6.9 ml LV SV MOD A2C:     44.3 ml LV SV MOD A4C:     42.7 ml LV SV MOD BP:      41.5 ml RIGHT VENTRICLE RV Basal diam:  3.30 cm RV Mid diam:    2.70 cm RV S prime:     15.00 cm/s TAPSE (M-mode): 2.5 cm LEFT ATRIUM             Index        RIGHT ATRIUM           Index LA diam:        4.80 cm 2.65 cm/m   RA Area:     15.70 cm LA Vol (A2C):   76.3 ml 42.10 ml/m  RA Volume:   39.50 ml  21.80 ml/m LA Vol (A4C):   86.6 ml 47.79 ml/m LA Biplane Vol: 89.3 ml 49.28 ml/m  AORTIC VALVE                     PULMONIC VALVE AV Area (Vmax):    2.22 cm      PV Vmax:       1.18 m/s AV Area (Vmean):   2.05 cm      PV Peak grad:  5.6 mmHg AV Area (VTI):     1.94 cm AV Vmax:           225.00 cm/s AV Vmean:          140.000 cm/s AV VTI:            0.498 m AV Peak Grad:      20.2 mmHg AV Mean Grad:      9.0 mmHg LVOT Vmax:         176.00 cm/s LVOT Vmean:        101.000 cm/s LVOT VTI:          0.341 m LVOT/AV VTI ratio: 0.68  AORTA Ao Root diam: 2.70 cm MITRAL VALVE                 TRICUSPID VALVE MV Area (PHT): 1.78 cm     TR Peak grad:   49.8 mmHg MV Area VTI:   1.82 cm     TR Vmax:        353.00 cm/s MV Peak grad:  8.4 mmHg MV Mean grad:  3.0 mmHg     SHUNTS MV Vmax:       1.45 m/s  Systemic VTI:  0.34 m MV Vmean:      85.2 cm/s    Systemic Diam: 1.90 cm MV Decel Time: 427 msec MV E velocity: 129.00 cm/s MV A velocity: 127.00 cm/s MV E/A ratio:  1.02 Rudean Haskell MD Electronically signed by Rudean Haskell MD Signature Date/Time: 06/22/2022/4:33:52 PM    Final    DG Chest 2 View  Result Date: 06/21/2022 CLINICAL DATA:  Dyspnea. EXAM: CHEST - 2 VIEW COMPARISON:  Chest x-ray dated December 05, 2020. FINDINGS: The heart size and mediastinal contours are within normal limits. Both lungs are clear. The visualized skeletal structures are unremarkable. IMPRESSION: No active cardiopulmonary disease. Electronically Signed   By: Titus Dubin M.D.   On: 06/21/2022 16:48     Scheduled Meds:  calcitRIOL  0.25 mcg Oral Once per day on Mon Wed Fri   flecainide  50 mg Oral BID   furosemide  40 mg Oral Daily   losartan  50 mg Oral Daily   lubiprostone  24 mcg Oral BID WC   octreotide  100 mcg Subcutaneous Q12H   pantoprazole (PROTONIX) IV  40 mg Intravenous QHS   potassium chloride SA  20 mEq Oral BID   Continuous Infusions:   LOS: 0 days    Roxan Hockey M.D on 06/22/2022 at 6:54 PM  Go to [email or web address] - for contact info  [facility] - Office  [phone]  If 7PM-7AM, please contact night-coverage [email or web address] 06/22/2022, 6:54 PM

## 2022-06-22 NOTE — Progress Notes (Signed)
  Transition of Care [facility]) Screening Note   Patient Details  Name: [name] [name] Date of Birth: 10-23-1946   Transition of Care [facility]) CM/SW Contact:    Ihor Gully, LCSW Phone Number: 06/22/2022, 3:25 PM    Transition of Care Department [facility]) has reviewed patient and no TOC needs have been identified at this time. We will continue to monitor patient advancement through interdisciplinary progression rounds. If new patient transition needs arise, please place a TOC consult.

## 2022-06-23 DIAGNOSIS — D649 Anemia, unspecified: Secondary | ICD-10-CM | POA: Diagnosis not present

## 2022-06-23 LAB — CBC
HCT: 29 % — ABNORMAL LOW (ref 36.0–46.0)
Hemoglobin: 8.8 g/dL — ABNORMAL LOW (ref 12.0–15.0)
MCH: 26.9 pg (ref 26.0–34.0)
MCHC: 30.3 g/dL (ref 30.0–36.0)
MCV: 88.7 fL (ref 80.0–100.0)
Platelets: 346 10*3/uL (ref 150–400)
RBC: 3.27 MIL/uL — ABNORMAL LOW (ref 3.87–5.11)
RDW: 15.5 % (ref 11.5–15.5)
WBC: 7.5 10*3/uL (ref 4.0–10.5)
nRBC: 0 % (ref 0.0–0.2)

## 2022-06-23 LAB — BASIC METABOLIC PANEL
Anion gap: 6 (ref 5–15)
BUN: 27 mg/dL — ABNORMAL HIGH (ref 8–23)
CO2: 29 mmol/L (ref 22–32)
Calcium: 9 mg/dL (ref 8.9–10.3)
Chloride: 103 mmol/L (ref 98–111)
Creatinine, Ser: 2.19 mg/dL — ABNORMAL HIGH (ref 0.44–1.00)
GFR, Estimated: 23 mL/min — ABNORMAL LOW (ref >60)
Glucose, Bld: 109 mg/dL — ABNORMAL HIGH (ref 70–99)
Potassium: 4.1 mmol/L (ref 3.5–5.1)
Sodium: 138 mmol/L (ref 135–145)

## 2022-06-23 MED ORDER — PANTOPRAZOLE SODIUM 40 MG PO TBEC: 40.0000 mg | DELAYED_RELEASE_TABLET | Freq: Every day | ORAL | 3 refills | Status: DC

## 2022-06-23 MED ORDER — FUROSEMIDE 20 MG PO TABS
20.0000 mg | ORAL_TABLET | ORAL | 3 refills | Status: DC
Start: 2022-06-23 — End: 2022-09-18

## 2022-06-23 MED ORDER — ALBUTEROL SULFATE HFA 108 (90 BASE) MCG/ACT IN AERS: 2.0000 | INHALATION_SPRAY | Freq: Four times a day (QID) | RESPIRATORY_TRACT | 1 refills | Status: DC | PRN

## 2022-06-23 MED ORDER — SODIUM CHLORIDE 0.9 % IV SOLN
510.0000 mg | Freq: Once | INTRAVENOUS | Status: AC
  Administered 2022-06-23: 510 mg via INTRAVENOUS
  Filled 2022-06-23: qty 17

## 2022-06-23 MED ORDER — ONDANSETRON HCL 4 MG PO TABS: 4.0000 mg | ORAL_TABLET | Freq: Four times a day (QID) | ORAL | 0 refills | Status: AC | PRN

## 2022-06-23 MED ORDER — POLYETHYLENE GLYCOL 3350 17 G PO PACK
17.0000 g | PACK | Freq: Every day | ORAL | 0 refills | Status: AC | PRN
Start: 2022-06-23 — End: ?

## 2022-06-23 NOTE — Progress Notes (Signed)
Nsg Discharge Note  Admit Date:  06/21/2022 Discharge date: 06/23/2022   [name] [name] [place] to be D/C'd Home per MD order.  AVS completed.   Removed IV-CDI. Reviewed d/c paperwork with patient. Answered all questions. Stable patient was wheeled to main entrance to be picked up by her friend to d/c to home. Patient/caregiver able to verbalize understanding.  Discharge Medication: Allergies as of 06/23/2022       Reactions   Amlodipine    Felt she retained fluid in her chest    Amoxicillin Hives   Did it involve swelling of the face/tongue/throat, SOB, or low BP? No Did it involve sudden or severe rash/hives, skin peeling, or any reaction on the inside of your mouth or nose? No Did you need to seek medical attention at a hospital or doctor's office? No When did it last happen?      10 + years If all above answers are "NO", may proceed with cephalosporin use.   Doxycycline    " sick and weak"   Acetaminophen-codeine Hives, Rash   Allopurinol Hives, Rash   Tramadol Hives, Rash        Medication List     TAKE these medications    acetaminophen 500 MG tablet Commonly known as: TYLENOL Take 1,000 mg by mouth every 6 (six) hours as needed for moderate pain.   albuterol 108 (90 Base) MCG/ACT inhaler Commonly known as: VENTOLIN HFA Inhale 2 puffs into the lungs every 6 (six) hours as needed for wheezing or shortness of breath. What changed: how much to take   calcitRIOL 0.25 MCG capsule Commonly known as: ROCALTROL Take 0.25 mcg by mouth 3 (three) times a week. Monday,Wednesday and Fridays   cyclobenzaprine 5 MG tablet Commonly known as: FLEXERIL Take 5 mg by mouth 3 (three) times daily as needed for muscle spasms.   diclofenac Sodium 1 % Gel Commonly known as: VOLTAREN Apply 1 application topically 4 (four) times daily as needed (pain).   Eliquis 5 MG Tabs tablet Generic drug: apixaban TAKE 1 TABLET BY MOUTH TWICE A DAY   flecainide 50 MG tablet Commonly known as:  TAMBOCOR TAKE 1 TABLET BY MOUTH TWICE A DAY   furosemide 20 MG tablet Commonly known as: LASIX Take 1 tablet (20 mg total) by mouth See admin instructions. Take 40 mg in the morning and 20 mg every evening What changed:  medication strength how much to take when to take this additional instructions   hydrocortisone 2.5 % rectal cream Commonly known as: ANUSOL-HC Place 1 application rectally 2 (two) times daily.   losartan 50 MG tablet Commonly known as: COZAAR Take 1 tablet (50 mg total) by mouth daily.   lubiprostone 24 MCG capsule Commonly known as: Amitiza Take 1 capsule (24 mcg total) by mouth in the morning and at bedtime.   octreotide 100 MCG/ML Soln injection Commonly known as: SANDOSTATIN Inject 1 mL (100 mcg total) into the skin every 12 (twelve) hours.   ondansetron 4 MG tablet Commonly known as: ZOFRAN Take 1 tablet (4 mg total) by mouth every 6 (six) hours as needed for nausea.   pantoprazole 40 MG tablet Commonly known as: PROTONIX Take 1 tablet (40 mg total) by mouth daily. What changed: See the new instructions.   polyethylene glycol 17 g packet Commonly known as: MIRALAX / GLYCOLAX Take 17 g by mouth daily as needed for mild constipation.   potassium chloride SA 20 MEQ tablet Commonly known as: KLOR-CON M Take 20 mEq by  mouth in the morning, at noon, and at bedtime.   rosuvastatin 10 MG tablet Commonly known as: CRESTOR TAKE 1 TABLET BY MOUTH EVERY DAY   vitamin C 250 MG tablet Commonly known as: ASCORBIC ACID Take 250 mg by mouth daily.   Vitamin D-3 25 MCG (1000 UT) Caps Take 1,000 Units by mouth daily.        Discharge Assessment: Vitals:   06/23/22 0506 06/23/22 1229  BP: 114/68 128/65  Pulse: (!) 56 66  Resp: 18 15  Temp: 98.2 F (36.8 C) 98.2 F (36.8 C)  SpO2: 99% 99%   Skin clean, dry and intact without evidence of skin break down, no evidence of skin tears noted. IV catheter discontinued intact. Site without signs and  symptoms of complications - no redness or edema noted at insertion site, patient denies c/o pain - only slight tenderness at site.  Dressing with slight pressure applied.  D/c Instructions-Education: Discharge instructions given to patient/family with verbalized understanding. D/c education completed with patient/family including follow up instructions, medication list, d/c activities limitations if indicated, with other d/c instructions as indicated by MD - patient able to verbalize understanding, all questions fully answered. Patient instructed to return to ED, call 911, or call MD for any changes in condition.  Patient escorted via [place], and D/C home via private auto.  Santa Lighter, RN 06/23/2022 4:11 PM

## 2022-06-23 NOTE — Discharge Summary (Signed)
[name] [name], is a 76 y.o. female  DOB 11/06/1946  MRN [id number].  Admission date:  06/21/2022  Admitting Physician  Onnie Boer, MD  Discharge Date:  06/23/2022   Primary MD  Arlina Robes, MD  Recommendations for primary care physician for things to follow:   1)Avoid ibuprofen/Advil/Aleve/Motrin/Goody Powders/Naproxen/BC powders/Meloxicam/Diclofenac/Indomethacin and other Nonsteroidal anti-inflammatory medications as these will make you more likely to bleed and can cause stomach ulcers, can also cause Kidney problems.   2)Repeat CBC and BMP Blood trest around July 5th  3)Follow-up with Gastroenterologist Dr. Earnest Bailey with [facility]--- for evaluation in about 4 weeks or so -address: [address], [place], [place] [address], Phone: [phone]  4)Dr. Queen Blossom office will help to set you up with [facility] as an outpatient/clinic appointment to be evaluated for a procedure called double balloon enteroscopy  Admission Diagnosis  Shortness of breath [R06.02] Dyspnea [R06.00]   Discharge Diagnosis  Shortness of breath [R06.02] Dyspnea [R06.00]    Principal Problem:   Symptomatic anemia Active Problems:   Paroxysmal atrial fibrillation (HCC)   Essential hypertension   GI bleed   AVM (arteriovenous malformation) of small bowel, acquired with hemorrhage   Dyspnea   CKD (chronic kidney disease) stage 4, GFR 15-29 ml/min (HCC)      Past Medical History:  Diagnosis Date   Anemia    Chronic kidney disease    Constipation 07/17/2017   GERD (gastroesophageal reflux disease)    Gout    Grade II diastolic dysfunction 11/23/2020   Hyperlipidemia    Hypertension    Hypokalemia    OSA (obstructive sleep apnea) 08/09/2020   PAF (paroxysmal atrial fibrillation) (HCC)    PAF (paroxysmal atrial fibrillation) (HCC)    PUD (peptic ulcer disease)     Syncope    Neurocardiogenic    Past Surgical History:  Procedure Laterality Date   ABDOMINAL AORTOGRAM W/LOWER EXTREMITY Right 12/13/2020   Procedure: ABDOMINAL AORTOGRAM W/LOWER EXTREMITY;  Surgeon: Nada Libman, MD;  [facility];  Service: Cardiovascular;  Laterality: Right;   ABDOMINAL HYSTERECTOMY     AMPUTATION Right 12/14/2020   Procedure: Right fifth toe amputation;  Surgeon: Larina Earthly, MD;  Location: [facility] OR;  Service: Vascular;  Laterality: Right;   CATARACT EXTRACTION Bilateral    COLONOSCOPY N/A 10/07/2014   Dr. Darrick Penna: redundant left colon, moderate sized external hemorrhoids    COLONOSCOPY N/A 03/11/2020   Surgeon: West Bali, MD;  9 polyps, majority of which were tubular adenomas, nodular mucosa in the anus s/p biopsy which was benign.   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    HOT HEMOSTASIS  11/27/2020   Procedure: HOT HEMOSTASIS (ARGON PLASMA COAGULATION/BICAP);  Surgeon: Marguerita Merles, Reuel Boom, MD;  Location: [facility];  Service: Gastroenterology;;   LIPOMA RESECTION     PERIPHERAL VASCULAR INTERVENTION Right 12/13/2020   Procedure: PERIPHERAL VASCULAR INTERVENTION;  Surgeon: Nada Libman, MD;  Location: [facility];  Service: Cardiovascular;  Laterality: Right;  SFA/PT/AT   POLYPECTOMY  03/11/2020   Procedure: POLYPECTOMY;  Surgeon: West Bali, MD;  Location: [facility];  Service: Endoscopy;;  cecal, transverse, descending, sigmoid   TEE WITHOUT CARDIOVERSION N/A 05/10/2021    Procedure: TRANSESOPHAGEAL ECHOCARDIOGRAM (TEE) WATCHMEN EVALUATION;  Surgeon: Wendall Stade, MD;  Location: [facility] ENDOSCOPY;  Service: Cardiovascular;  Laterality: N/A;     HPI  from the history and physical done on the day of admission:     Chief Complaint: Difficulty breathing with activity   HPI: [name] [name] is a 76 y.o. female with medical history significant for GI bleed secondary to AVMs, paroxysmal atrial fibrillation, hypertension, OSA, iron deficiency, CKD. Patient presented to ED with complaints of weakness, difficulty breathing over the past 2 to 3 days.  She reports difficulty breathing with exertion.  No chest pain.  No lower extremity swelling.  She reports mild intermittent dizziness that is unchanged.  No cough.  No fevers no chills. She has history of AVMs, but reports she has not had any blood in her stools or black stools in a while.   ED Course: Temperature 98.1.  Heart rate 46-86.  Respiratory rate 15-20.  Blood pressure systolic 111-161.  O2 sat greater than 98% on room air.  Hemoglobin 7.8.  Troponin 14 > 17.  BNP 171.  EKG shows sinus rhythm.  Chest x-ray clear.  ED provider noted cardiac murmur that was not documented previously.  Consulted cardiology, recommended repeat echo.   On ambulating patient to the ED, patient became very dyspneic hence hospitalization.   Review of Systems: As per HPI all other systems reviewed and negative.   Hospital Course:   Assessment and Plan:  76 y.o. female with medical history significant for GI bleed secondary to AVMs, paroxysmal atrial fibrillation, hypertension, OSA, iron deficiency, CKD--admitted on 06/21/22 with acute on chronic symptomatic anemia     -Assessment and Plan: 1) acute on chronic symptomatic anemia--iron deficiency anemia -  Last endoluminal evaluation- upper and small bowel endoscopy-11/2020-Dr. Levon Hedger.  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 Treated with argon plasma coagulation  (APC). - A few non-bleeding angiodysplastic lesions in the jejunum. Treated with argon beam Coagulation. -GI consult appreciated -Patient received 1 unit of PRBC and iron infusion -Continue Protonix and octreotide -GI recommends outpatient follow-up with [facility] for possible double-balloon enteroscopy -Tolerating diet well  -GI recommends restarting Eliquis    CKD (chronic kidney disease) stage 4, GFR 15-29 ml/min (HCC) Creatinine 2.29, at baseline 1.9-2.2.   HFpEF--Echo from 06/22/2022 with EF 70 to 55%, grade 2 diastolic dysfunction, moderate pulmonary hypertension -Moderately calcified and sclerotic aortic valve noted, no regarding -Moderate to severe mitral annular calcification with or without mitral regurg   Troponin 14 > 17.  - TEE done 05/10/2021 for watchman evaluation-EF 60 to 65%, 80 minutes LV parameters, mild mitral regurgitation and mild mitral stenosis, severe mitral annular calcification, mild to moderate tricuspid valve regurgitation.  Moderate aortic valve calcification.  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 -Dyspnea on exertion resolved after transfusion of PRBC  and iron infusion   Essential hypertension Systolic 1 11-1 61. -Resume losartan, Lasix    Paroxysmal atrial fibrillation (HCC) Stable, rate controlled and on chronic anticoagulation with Eliquis.   -Resume flecainide -Restart Eliquis in a.m.  Disposition: The patient is from: Home              Anticipated d/c is to: Home              Code Status :  -  Code Status: Full Code    Discharge Condition: stable  Follow UP--outpatient follow-up with GI at [facility]   Consults obtained - Gi  Diet and Activity recommendation:  As advised  Discharge Instructions    Discharge Instructions     Call MD for:  difficulty breathing, headache or visual disturbances   Complete by: As directed    Call MD for:  persistant dizziness or light-headedness   Complete by: As directed    Call MD  for:  persistant nausea and vomiting   Complete by: As directed    Call MD for:  temperature >100.4   Complete by: As directed    Diet - low sodium heart healthy   Complete by: As directed    Discharge instructions   Complete by: As directed    1)Avoid ibuprofen/Advil/Aleve/Motrin/Goody Powders/Naproxen/BC powders/Meloxicam/Diclofenac/Indomethacin and other Nonsteroidal anti-inflammatory medications as these will make you more likely to bleed and can cause stomach ulcers, can also cause Kidney problems.   2)Repeat CBC and BMP Blood trest around July 5th  3)Follow-up with Gastroenterologist Dr. Earnest Bailey with [facility]--- for evaluation in about 4 weeks or so -address: [address] [place], [place] [address], Phone: [phone]  4)Dr. Queen Blossom office will help to set you up with [facility] as an outpatient/clinic appointment to be evaluated for a procedure called double balloon enteroscopy   Increase activity slowly   Complete by: As directed          Discharge Medications     Allergies as of 06/23/2022       Reactions   Amlodipine    Felt she retained fluid in her chest    Amoxicillin Hives   Did it involve swelling of the face/tongue/throat, SOB, or low BP? No Did it involve sudden or severe rash/hives, skin peeling, or any reaction on the inside of your mouth or nose? No Did you need to seek medical attention at a hospital or doctor's office? No When did it last happen?      10 + years If all above answers are "NO", may proceed with cephalosporin use.   Doxycycline    " sick and weak"   Acetaminophen-codeine Hives, Rash   Allopurinol Hives, Rash   Tramadol Hives, Rash        Medication List     TAKE these medications    acetaminophen 500 MG tablet Commonly known as: TYLENOL Take 1,000 mg by mouth every 6 (six) hours as needed for moderate pain.   albuterol 108 (90 Base) MCG/ACT inhaler Commonly known as: VENTOLIN  HFA Inhale 2 puffs into the lungs every 6 (six) hours as needed for wheezing or shortness of breath. What changed: how much to take   calcitRIOL 0.25 MCG capsule Commonly known as: ROCALTROL Take 0.25 mcg by mouth 3 (three) times a week. Monday,Wednesday and Fridays   cyclobenzaprine 5 MG tablet Commonly known as: FLEXERIL Take 5 mg by mouth 3 (three) times daily as needed for muscle spasms.   diclofenac Sodium 1 %  Gel Commonly known as: VOLTAREN Apply 1 application topically 4 (four) times daily as needed (pain).   Eliquis 5 MG Tabs tablet Generic drug: apixaban TAKE 1 TABLET BY MOUTH TWICE A DAY   flecainide 50 MG tablet Commonly known as: TAMBOCOR TAKE 1 TABLET BY MOUTH TWICE A DAY   furosemide 20 MG tablet Commonly known as: LASIX Take 1 tablet (20 mg total) by mouth See admin instructions. Take 40 mg in the morning and 20 mg every evening What changed:  medication strength how much to take when to take this additional instructions   hydrocortisone 2.5 % rectal cream Commonly known as: ANUSOL-HC Place 1 application rectally 2 (two) times daily.   losartan 50 MG tablet Commonly known as: COZAAR Take 1 tablet (50 mg total) by mouth daily.   lubiprostone 24 MCG capsule Commonly known as: Amitiza Take 1 capsule (24 mcg total) by mouth in the morning and at bedtime.   octreotide 100 MCG/ML Soln injection Commonly known as: SANDOSTATIN Inject 1 mL (100 mcg total) into the skin every 12 (twelve) hours.   ondansetron 4 MG tablet Commonly known as: ZOFRAN Take 1 tablet (4 mg total) by mouth every 6 (six) hours as needed for nausea.   pantoprazole 40 MG tablet Commonly known as: PROTONIX Take 1 tablet (40 mg total) by mouth daily. What changed: See the new instructions.   polyethylene glycol 17 g packet Commonly known as: MIRALAX / GLYCOLAX Take 17 g by mouth daily as needed for mild constipation.   potassium chloride SA 20 MEQ tablet Commonly known as:  KLOR-CON M Take 20 mEq by mouth in the morning, at noon, and at bedtime.   rosuvastatin 10 MG tablet Commonly known as: CRESTOR TAKE 1 TABLET BY MOUTH EVERY DAY   vitamin C 250 MG tablet Commonly known as: ASCORBIC ACID Take 250 mg by mouth daily.   Vitamin D-3 25 MCG (1000 UT) Caps Take 1,000 Units by mouth daily.        Major procedures and Radiology Reports - PLEASE review detailed and final reports for all details, in brief -   ECHOCARDIOGRAM COMPLETE  Result Date: 06/22/2022    ECHOCARDIOGRAM REPORT   Patient Name:   [name] [name] Date of Exam: 06/22/2022 Medical Rec #:  [id number]       Height:       62.0 in Accession #:    [id number]      Weight:       176.4 lb Date of Birth:  1946/10/09       BSA:          1.812 m Patient Age:    76 years        BP:           100/52 mmHg Patient Gender: F               HR:           64 bpm. [facility] Procedure: 2D Echo, Cardiac Doppler and Color Doppler Indications:    Murmur  History:        Patient has prior history of Echocardiogram examinations, most                 recent 04/27/2021. Arrythmias:Atrial Fibrillation and Bradycardia,                 Signs/Symptoms:Syncope, Chest Pain and Dyspnea; Risk  Factors:Hypertension and Dyslipidemia.  Sonographer:    Mikki Harbor Referring Phys: [id number] Heloise Beecham Treysean Petruzzi IMPRESSIONS  1. Left ventricular ejection fraction, by estimation, is 70 to 75%. The left ventricle has hyperdynamic function. The left ventricle has no regional wall motion abnormalities. There is severe concentric left ventricular hypertrophy. Left ventricular diastolic parameters are consistent with Grade II diastolic dysfunction (pseudonormalization). Small intracavitary gradient ~ 5 mm Hg.  2. Right ventricular systolic function is normal. The right ventricular size is normal. There is moderately elevated pulmonary artery systolic pressure. The estimated right ventricular systolic pressure is 57.8 mmHg.   3. Left atrial size was severely dilated.  4. A small pericardial effusion is present.  5. The mitral valve is degenerative. No evidence of mitral valve regurgitation. The mean mitral valve gradient is 3.0 mmHg with average heart rate of 61 bpm. Moderate to severe mitral annular calcification.  6. Tricuspid valve regurgitation is mild to moderate.  7. The aortic valve is tricuspid. There is moderate calcification of the aortic valve. Aortic valve regurgitation is not visualized. Aortic valve sclerosis/calcification is present. Aortic valve mean gradient measures 9.0 mmHg. Aortic valve Vmax measures 2.25 m/s. Comparison(s): Slight worsening in RVSP from 2022. FINDINGS  Left Ventricle: Left ventricular ejection fraction, by estimation, is 70 to 75%. The left ventricle has hyperdynamic function. The left ventricle has no regional wall motion abnormalities. The left ventricular internal cavity size was normal in size. There is severe concentric left ventricular hypertrophy. Left ventricular diastolic parameters are consistent with Grade II diastolic dysfunction (pseudonormalization). Right Ventricle: The right ventricular size is normal. No increase in right ventricular wall thickness. Right ventricular systolic function is normal. There is moderately elevated pulmonary artery systolic pressure. The tricuspid regurgitant velocity is 3.53 m/s, and with an assumed right atrial pressure of 8 mmHg, the estimated right ventricular systolic pressure is 57.8 mmHg. Left Atrium: Left atrial size was severely dilated. Right Atrium: Right atrial size was normal in size. Pericardium: A small pericardial effusion is present. Mitral Valve: The mitral valve is degenerative in appearance. Moderate to severe mitral annular calcification. No evidence of mitral valve regurgitation. MV peak gradient, 8.4 mmHg. The mean mitral valve gradient is 3.0 mmHg with average heart rate of 61  bpm. Tricuspid Valve: The tricuspid valve is normal in  structure. Tricuspid valve regurgitation is mild to moderate. No evidence of tricuspid stenosis. Aortic Valve: The aortic valve is tricuspid. There is moderate calcification of the aortic valve. Aortic valve regurgitation is not visualized. Aortic valve sclerosis/calcification is present, without any evidence of aortic stenosis. Aortic valve mean gradient measures 9.0 mmHg. Aortic valve peak gradient measures 20.2 mmHg. Aortic valve area, by VTI measures 1.94 cm. Pulmonic Valve: The pulmonic valve was not well visualized. Pulmonic valve regurgitation is mild. No evidence of pulmonic stenosis. Aorta: The aortic root is normal in size and structure and the ascending aorta was not well visualized. IAS/Shunts: No atrial level shunt detected by color flow Doppler.  LEFT VENTRICLE PLAX 2D LVIDd:         4.30 cm     Diastology LVIDs:         2.10 cm     LV e' medial:    4.79 cm/s LV PW:         1.50 cm     LV E/e' medial:  26.9 LV IVS:        1.40 cm     LV e' lateral:   8.05 cm/s LVOT diam:  1.90 cm     LV E/e' lateral: 16.0 LV SV:         97 LV SV Index:   53 LVOT Area:     2.84 cm  LV Volumes (MOD) LV vol d, MOD A2C: 59.6 ml LV vol d, MOD A4C: 42.7 ml LV vol s, MOD A2C: 15.3 ml LV vol s, MOD A4C: 6.9 ml LV SV MOD A2C:     44.3 ml LV SV MOD A4C:     42.7 ml LV SV MOD BP:      41.5 ml RIGHT VENTRICLE RV Basal diam:  3.30 cm RV Mid diam:    2.70 cm RV S prime:     15.00 cm/s TAPSE (M-mode): 2.5 cm LEFT ATRIUM             Index        RIGHT ATRIUM           Index LA diam:        4.80 cm 2.65 cm/m   RA Area:     15.70 cm LA Vol (A2C):   76.3 ml 42.10 ml/m  RA Volume:   39.50 ml  21.80 ml/m LA Vol (A4C):   86.6 ml 47.79 ml/m LA Biplane Vol: 89.3 ml 49.28 ml/m  AORTIC VALVE                     PULMONIC VALVE AV Area (Vmax):    2.22 cm      PV Vmax:       1.18 m/s AV Area (Vmean):   2.05 cm      PV Peak grad:  5.6 mmHg AV Area (VTI):     1.94 cm AV Vmax:           225.00 cm/s AV Vmean:          140.000 cm/s AV  VTI:            0.498 m AV Peak Grad:      20.2 mmHg AV Mean Grad:      9.0 mmHg LVOT Vmax:         176.00 cm/s LVOT Vmean:        101.000 cm/s LVOT VTI:          0.341 m LVOT/AV VTI ratio: 0.68  AORTA Ao Root diam: 2.70 cm MITRAL VALVE                TRICUSPID VALVE MV Area (PHT): 1.78 cm     TR Peak grad:   49.8 mmHg MV Area VTI:   1.82 cm     TR Vmax:        353.00 cm/s MV Peak grad:  8.4 mmHg MV Mean grad:  3.0 mmHg     SHUNTS MV Vmax:       1.45 m/s     Systemic VTI:  0.34 m MV Vmean:      85.2 cm/s    Systemic Diam: 1.90 cm MV Decel Time: 427 msec MV E velocity: 129.00 cm/s MV A velocity: 127.00 cm/s MV E/A ratio:  1.02 Riley Lam MD Electronically signed by Riley Lam MD Signature Date/Time: 06/22/2022/4:33:52 PM    Final    DG Chest 2 View  Result Date: 06/21/2022 CLINICAL DATA:  Dyspnea. EXAM: CHEST - 2 VIEW COMPARISON:  Chest x-ray dated December 05, 2020. FINDINGS: The heart size and mediastinal contours are within normal limits. Both lungs are clear. The visualized skeletal structures are unremarkable.  IMPRESSION: No active cardiopulmonary disease. Electronically Signed   By: Obie Dredge M.D.   On: 06/21/2022 16:48    Micro Results   Today   Subjective    Knute Neu today has no new complaints  No fever  Or chills  -No bleeding concerns -No chest pains -Dyspnea on exertion resolved          Patient has been seen and examined prior to discharge   Objective   Blood pressure 128/65, pulse 66, temperature 98.2 F (36.8 C), resp. rate 15, height 5\' 2"  (1.575 m), weight 80 kg, SpO2 99 %.   Intake/Output Summary (Last 24 hours) at 06/23/2022 1359 Last data filed at 06/23/2022 1300 Gross per 24 hour  Intake 480 ml  Output --  Net 480 ml    Exam Gen:- Awake Alert, no acute distress  HEENT:- [place].AT, No sclera icterus Neck-Supple Neck,No JVD,.  Lungs-  CTAB , good air movement bilaterally CV- S1, S2 normal, regular, 3/6 sm Abd-  +ve B.Sounds, Abd Soft,  No tenderness,    Extremity/Skin:- No  edema,   good pulses Psych-affect is appropriate, oriented x3 Neuro-no new focal deficits, no tremors    Data Review   CBC w Diff:  Lab Results  Component Value Date   WBC 7.5 06/23/2022   HGB 8.8 (L) 06/23/2022   HCT 29.0 (L) 06/23/2022   PLT 346 06/23/2022   LYMPHOPCT 26 06/21/2022   MONOPCT 7 06/21/2022   EOSPCT 1 06/21/2022   BASOPCT 1 06/21/2022    CMP:  Lab Results  Component Value Date   NA 138 06/23/2022   K 4.1 06/23/2022   CL 103 06/23/2022   CO2 29 06/23/2022   BUN 27 (H) 06/23/2022   CREATININE 2.19 (H) 06/23/2022   CREATININE 1.17 (H) 05/06/2012   PROT 7.2 06/21/2022   ALBUMIN 4.3 06/21/2022   BILITOT 0.7 06/21/2022   ALKPHOS 59 06/21/2022   AST 21 06/21/2022   ALT 12 06/21/2022  .  Total Discharge time is about 33 minutes  Shon Hale M.D on 06/23/2022 at 1:59 PM  Go to [email or web address] -  for contact info  [facility] - Office  [phone]

## 2022-06-23 NOTE — Plan of Care (Signed)
  Problem: Education: Goal: Knowledge of General Education information will improve Description Including pain rating scale, medication(s)/side effects and non-pharmacologic comfort measures Outcome: Progressing   Problem: Health Behavior/Discharge Planning: Goal: Ability to manage health-related needs will improve Outcome: Progressing   

## 2022-06-23 NOTE — Plan of Care (Signed)
  Problem: Education: Goal: Knowledge of General Education information will improve Description: Including pain rating scale, medication(s)/side effects and non-pharmacologic comfort measures 06/23/2022 1200 by Santa Lighter, RN Outcome: Adequate for Discharge 06/23/2022 1117 by Santa Lighter, RN Outcome: Progressing   Problem: Health Behavior/Discharge Planning: Goal: Ability to manage health-related needs will improve 06/23/2022 1200 by Santa Lighter, RN Outcome: Adequate for Discharge 06/23/2022 1117 by Santa Lighter, RN Outcome: Progressing   Problem: Clinical Measurements: Goal: Ability to maintain clinical measurements within normal limits will improve Outcome: Adequate for Discharge Goal: Will remain free from infection Outcome: Adequate for Discharge Goal: Diagnostic test results will improve Outcome: Adequate for Discharge Goal: Respiratory complications will improve Outcome: Adequate for Discharge Goal: Cardiovascular complication will be avoided Outcome: Adequate for Discharge   Problem: Activity: Goal: Risk for activity intolerance will decrease Outcome: Adequate for Discharge   Problem: Nutrition: Goal: Adequate nutrition will be maintained Outcome: Adequate for Discharge   Problem: Coping: Goal: Level of anxiety will decrease Outcome: Adequate for Discharge   Problem: Elimination: Goal: Will not experience complications related to bowel motility Outcome: Adequate for Discharge Goal: Will not experience complications related to urinary retention Outcome: Adequate for Discharge   Problem: Pain Managment: Goal: General experience of comfort will improve Outcome: Adequate for Discharge   Problem: Safety: Goal: Ability to remain free from injury will improve Outcome: Adequate for Discharge   Problem: Skin Integrity: Goal: Risk for impaired skin integrity will decrease Outcome: Adequate for Discharge   Problem: Education: Goal: Knowledge of General  Education information will improve Description: Including pain rating scale, medication(s)/side effects and non-pharmacologic comfort measures Outcome: Adequate for Discharge   Problem: Health Behavior/Discharge Planning: Goal: Ability to manage health-related needs will improve Outcome: Adequate for Discharge   Problem: Clinical Measurements: Goal: Ability to maintain clinical measurements within normal limits will improve Outcome: Adequate for Discharge Goal: Will remain free from infection Outcome: Adequate for Discharge Goal: Diagnostic test results will improve Outcome: Adequate for Discharge Goal: Respiratory complications will improve Outcome: Adequate for Discharge Goal: Cardiovascular complication will be avoided Outcome: Adequate for Discharge   Problem: Activity: Goal: Risk for activity intolerance will decrease Outcome: Adequate for Discharge   Problem: Nutrition: Goal: Adequate nutrition will be maintained Outcome: Adequate for Discharge   Problem: Coping: Goal: Level of anxiety will decrease Outcome: Adequate for Discharge   Problem: Elimination: Goal: Will not experience complications related to bowel motility Outcome: Adequate for Discharge Goal: Will not experience complications related to urinary retention Outcome: Adequate for Discharge   Problem: Pain Managment: Goal: General experience of comfort will improve Outcome: Adequate for Discharge   Problem: Safety: Goal: Ability to remain free from injury will improve Outcome: Adequate for Discharge   Problem: Skin Integrity: Goal: Risk for impaired skin integrity will decrease Outcome: Adequate for Discharge

## 2022-06-23 NOTE — Discharge Instructions (Signed)
1)Avoid ibuprofen/Advil/Aleve/Motrin/Goody Powders/Naproxen/BC powders/Meloxicam/Diclofenac/Indomethacin and other Nonsteroidal anti-inflammatory medications as these will make you more likely to bleed and can cause stomach ulcers, can also cause Kidney problems.   2)Repeat CBC and BMP Blood trest around July 5th  3)Follow-up with Gastroenterologist Dr. Hurshel Keys with [facility]--- for evaluation in about 4 weeks or so -address: [address] [place], [place] [address], Phone: [phone]  4)Dr. Ave Filter office will help to set you up with [facility] as an outpatient/clinic appointment to be evaluated for a procedure called double balloon enteroscopy

## 2022-06-25 ENCOUNTER — Encounter: Payer: Self-pay | Admitting: Internal Medicine

## 2022-06-25 ENCOUNTER — Other Ambulatory Visit: Payer: Self-pay

## 2022-06-25 DIAGNOSIS — D509 Iron deficiency anemia, unspecified: Secondary | ICD-10-CM

## 2022-06-25 NOTE — Telephone Encounter (Signed)
Lab was ordered and pt was made aware.

## 2022-06-27 NOTE — Telephone Encounter (Signed)
Referral placed.

## 2022-06-27 NOTE — Addendum Note (Signed)
Addended by: Cheron Every on: 06/27/2022 02:49 PM   Modules accepted: Orders

## 2022-06-28 ENCOUNTER — Encounter (HOSPITAL_COMMUNITY)
Admission: RE | Admit: 2022-06-28 | Discharge: 2022-06-28 | Disposition: A | Discharge status: Home / Self Care | Payer: Medicare HMO | Source: Ambulatory Visit | Attending: Nephrology | Admitting: Nephrology

## 2022-06-28 VITALS — BP 146/58 | HR 50 | Temp 98.0°F | Resp 14 | Ht 62.0 in | Wt 176.4 lb

## 2022-06-28 DIAGNOSIS — N179 Acute kidney failure, unspecified: Secondary | ICD-10-CM | POA: Diagnosis present

## 2022-06-28 DIAGNOSIS — N184 Chronic kidney disease, stage 4 (severe): Secondary | ICD-10-CM | POA: Insufficient documentation

## 2022-06-28 DIAGNOSIS — D631 Anemia in chronic kidney disease: Secondary | ICD-10-CM | POA: Insufficient documentation

## 2022-06-28 LAB — POCT HEMOGLOBIN-HEMACUE: Hemoglobin: 9.9 g/dL — ABNORMAL LOW (ref 12.0–15.0)

## 2022-06-28 MED ORDER — EPOETIN ALFA-EPBX 3000 UNIT/ML IJ SOLN
INTRAMUSCULAR | Status: AC
  Administered 2022-06-28: 3000 [IU] via SUBCUTANEOUS
  Filled 2022-06-28: qty 2

## 2022-06-28 MED ORDER — EPOETIN ALFA-EPBX 10000 UNIT/ML IJ SOLN
6000.0000 [IU] | Freq: Once | INTRAMUSCULAR | Status: DC
Start: 2022-06-28 — End: 2022-06-28

## 2022-06-28 MED ORDER — EPOETIN ALFA-EPBX 3000 UNIT/ML IJ SOLN
3000.0000 [IU] | Freq: Once | INTRAMUSCULAR | Status: AC
  Administered 2022-06-28: 3000 [IU] via SUBCUTANEOUS

## 2022-06-28 MED ORDER — EPOETIN ALFA-EPBX 3000 UNIT/ML IJ SOLN: 3000.0000 [IU] | Freq: Once | INTRAMUSCULAR | Status: AC

## 2022-07-01 NOTE — Progress Notes (Unsigned)
[facility] [address] [place] [address]   CLINIC:  Medical Oncology/Hematology  CONSULT NOTE  Patient Care Team: Andres Shad, MD as PCP - General (Family Medicine) Satira Sark, MD as PCP - Cardiology (Cardiology) Vickie Epley, MD as PCP - Electrophysiology (Cardiology) Satira Sark, MD as Consulting Physician (Cardiology) Danie Binder, MD (Inactive) as Consulting Physician (Gastroenterology) Eloise Harman, DO as Consulting Physician (Internal Medicine)  CHIEF COMPLAINTS/PURPOSE OF CONSULTATION:  Anemia  HISTORY OF PRESENTING ILLNESS:  [name] [name] 76 y.o. female is here at the request of Dr. Theador Hawthorne for evaluation and treatment of anemia in the setting of CKD stage IV, secondary hyperparathyroidism, chronic diastolic CHF, atrial fibrillation, hypertension, GERD, and iron deficiency from GI bleeds.  [name] [name] was told that [name] [name] may benefit from IV iron infusions.  Patient received IV iron via Dr. Theador Hawthorne, was also started on outpatient Retacrit injections since February 2022, most recently given 6000 units on 06/28/2022 (every other week).  [name] [name] has history of blood transfusions with PRBC x4 in the past year.  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  Additionally, patient was recently hospitalized from 06/21/2022 through 06/23/2022 for symptomatic anemia causing weakness and difficulty breathing.  [name] [name] received 1 unit PRBC and Feraheme IV iron infusion x1 during hospitalization.  [name] [name] is on Protonix and also self-administers SQ octreotide twice daily.  Her local GI provider recommended outpatient follow-up with [facility] for possible double-balloon enteroscopy.  [name] [name] was cleared by GI to restart Eliquis for her atrial fibrillation.    Last endoluminal evaluation in December 2021 by Dr. 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  Colonoscopy in March 2021 with polyps but no source of bleeding.  Most recent labs (06/22/2022) show significant iron deficiency with ferritin 12, iron saturation 5%, TIBC 441, and serum iron 21.  Most recent Hgb (06/28/2022) is 9.9/MCV 88.7, but [name] [name] was as low as 7.8 during her recent hospitalization.  Most recent creatinine 2.19/GFR 23.  Review of past labs reveal that [name] [name] has had anemia since September 2021.  Patient has history of intermittent bright red blood per rectum (last noted 3 weeks ago) and intermittent melena (last noted about 3 months ago).  [name] [name] reports significant fatigue with energy about 40%.  [name] [name] also has pica cravings for ice, restless legs, lightheadedness, palpitations, and dyspnea on exertion.  Her symptoms are slightly improved after most recent blood transfusion.    [name] [name] eats a balanced diet.  [name] [name] does not take any oral iron supplement.  [name] [name] takes oral Protonix and is on Eliquis for atrial fibrillation.  Past medical history significant for CKD stage IV, secondary hyperparathyroidism, chronic diastolic CHF, atrial fibrillation, hypertension, GERD, peripheral arterial disease, mild COPD, and iron deficiency from GI bleeds.   [name] [name] is retired and lives at home alone.  [name] [name] is able to ambulate short distances without difficulty and is independent with ADLs.  [name] [name] is a former smoker, smokes 1 PPD for 25 years, quit in 1998.  [name] [name] denies any alcohol or illicit drug use.  Family history positive for mother with unspecified anemia.  Her sister had breast cancer.  Maternal grandmother also had breast cancer.   MEDICAL HISTORY:  Past Medical History:  Diagnosis Date   Anemia    Chronic  kidney disease    Constipation 07/17/2017   GERD (gastroesophageal reflux disease)    Gout    Grade II diastolic dysfunction 55/06/3219   Hyperlipidemia    Hypertension     Hypokalemia    OSA (obstructive sleep apnea) 08/09/2020   PAF (paroxysmal atrial fibrillation) (HCC)    PAF (paroxysmal atrial fibrillation) (HCC)    PUD (peptic ulcer disease)    Syncope    Neurocardiogenic    SURGICAL HISTORY: Past Surgical History:  Procedure Laterality Date   ABDOMINAL AORTOGRAM W/LOWER EXTREMITY Right 12/13/2020   Procedure: ABDOMINAL AORTOGRAM W/LOWER EXTREMITY;  Surgeon: Serafina Mitchell, MD;  Location: [facility];  Service: Cardiovascular;  Laterality: Right;   ABDOMINAL HYSTERECTOMY     AMPUTATION Right 12/14/2020   Procedure: Right fifth toe amputation;  Surgeon: Rosetta Posner, MD;  Location: [facility];  Service: Vascular;  Laterality: Right;   CATARACT EXTRACTION Bilateral    COLONOSCOPY N/A 10/07/2014   Dr. Oneida Alar: redundant left colon, moderate sized external hemorrhoids    COLONOSCOPY N/A 03/11/2020   Surgeon: Danie Binder, MD;  9 polyps, majority of which were tubular adenomas, nodular mucosa in the anus s/p biopsy which was benign.   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   ESOPHAGOGASTRODUODENOSCOPY (EGD) WITH PROPOFOL N/A 11/25/2020    Surgeon: Daneil Dolin, MD; Normal, s/p capsule deployment   [place] N/A 11/25/2020    Surgeon: Daneil Dolin, MD; 4/nonbleeding AVMs in the proximal small bowel large fresh blood, 2 bleeding lymphangiectasis in SB.    HOT HEMOSTASIS  11/27/2020   Procedure: HOT HEMOSTASIS (ARGON PLASMA COAGULATION/BICAP);  Surgeon: Montez Morita, Quillian Quince, MD;  [facility];  Service: Gastroenterology;;   LIPOMA RESECTION     PERIPHERAL VASCULAR INTERVENTION Right 12/13/2020   Procedure: PERIPHERAL VASCULAR INTERVENTION;  Surgeon: Serafina Mitchell, MD;  Location: [facility];  Service: Cardiovascular;  Laterality: Right;  SFA/PT/AT   POLYPECTOMY   03/11/2020   Procedure: POLYPECTOMY;  Surgeon: Danie Binder, MD;  [facility];  Service: Endoscopy;;  cecal, transverse, descending, sigmoid   TEE WITHOUT CARDIOVERSION N/A 05/10/2021   Procedure: TRANSESOPHAGEAL ECHOCARDIOGRAM (TEE) WATCHMEN EVALUATION;  Surgeon: Josue Hector, MD;  Location: [facility];  Service: Cardiovascular;  Laterality: N/A;    SOCIAL HISTORY: Social History   Socioeconomic History   Marital status: Single    Spouse name: Not on file   Number of children: Not on file   Years of education: Not on file   Highest education level: Not on file  Occupational History   Occupation: Retired  Tobacco Use   Smoking status: Former    Packs/day: 1.00    Years: 20.00    Total pack years: 20.00    Types: Cigarettes    Start date: 03/16/1964    Quit date: 10/07/1997    Years since quitting: 24.7   Smokeless tobacco: Never  Vaping Use   Vaping Use: Never used  Substance and Sexual Activity   Alcohol use: No    Alcohol/week: 0.0 standard drinks of alcohol   Drug use: No   Sexual activity: Yes    Partners: Male  Other Topics Concern   Not on file  Social History Narrative   Not on file   Social Determinants of Health   Financial Resource Strain: Not on file  Food Insecurity: Not on file  Transportation Needs: Not on file  Physical Activity: Not on file  Stress: Not on file  Social Connections: Not on file  Intimate Partner Violence: Not on file    FAMILY HISTORY: Family History  Problem Relation Age of Onset   Stroke Mother    Dementia Father    Cancer - Other Sister    Atrial fibrillation Sister    Heart failure Sister    Colon cancer Maternal Grandmother        diagnosed in her 68s    ALLERGIES:  is allergic to amlodipine, amoxicillin, doxycycline, acetaminophen-codeine, allopurinol, and tramadol.  MEDICATIONS:  Current Outpatient Medications  Medication Sig Dispense Refill   acetaminophen (TYLENOL) 500 MG tablet Take 1,000 mg  by mouth every 6 (six) hours as needed for moderate pain.     albuterol (VENTOLIN HFA) 108 (90 Base) MCG/ACT inhaler Inhale 2 puffs into the lungs every 6 (six) hours as needed for wheezing or shortness of breath. 18 g 1   calcitRIOL (ROCALTROL) 0.25 MCG capsule Take 0.25 mcg by mouth 3 (three) times a week. Monday,Wednesday and Fridays     Cholecalciferol (VITAMIN D-3) 25 MCG (1000 UT) CAPS Take 1,000 Units by mouth daily.     cyclobenzaprine (FLEXERIL) 5 MG tablet Take 5 mg by mouth 3 (three) times daily as needed for muscle spasms.     diclofenac Sodium (VOLTAREN) 1 % GEL Apply 1 application topically 4 (four) times daily as needed (pain).     ELIQUIS 5 MG TABS tablet TAKE 1 TABLET BY MOUTH TWICE A DAY 180 tablet 1   flecainide (TAMBOCOR) 50 MG tablet TAKE 1 TABLET BY MOUTH TWICE A DAY 60 tablet 5   furosemide (LASIX) 20 MG tablet Take 1 tablet (20 mg total) by mouth See admin instructions. Take 40 mg in the morning and 20 mg every evening 90 tablet 3   hydrocortisone (ANUSOL-HC) 2.5 % rectal cream Place 1 application rectally 2 (two) times daily. 30 g 1   losartan (COZAAR) 50 MG tablet Take 1 tablet (50 mg total) by mouth daily. 90 tablet 3   lubiprostone (AMITIZA) 24 MCG capsule Take 1 capsule (24 mcg total) by mouth in the morning and at bedtime. 180 capsule 3   octreotide (SANDOSTATIN) 100 MCG/ML SOLN injection Inject 1 mL (100 mcg total) into the skin every 12 (twelve) hours. 60 mL 11   ondansetron (ZOFRAN) 4 MG tablet Take 1 tablet (4 mg total) by mouth every 6 (six) hours as needed for nausea. 20 tablet 0   pantoprazole (PROTONIX) 40 MG tablet Take 1 tablet (40 mg total) by mouth daily. 90 tablet 3   polyethylene glycol (MIRALAX / GLYCOLAX) 17 g packet Take 17 g by mouth daily as needed for mild constipation. 14 each 0   potassium chloride SA (KLOR-CON) 20 MEQ tablet Take 20 mEq by mouth in the morning, at noon, and at bedtime.     rosuvastatin (CRESTOR) 10 MG tablet TAKE 1 TABLET BY MOUTH  EVERY DAY 30 tablet 0   vitamin C (ASCORBIC ACID) 250 MG tablet Take 250 mg by mouth daily.     No current facility-administered medications for this visit.    REVIEW OF SYSTEMS:   Review of Systems  Constitutional:  Positive for fatigue. Negative for appetite change, chills, diaphoresis, fever and unexpected weight change.  HENT:   Negative for lump/mass and nosebleeds.   Eyes:  Negative for eye problems.  Respiratory:  Positive for  shortness of breath. Negative for cough and hemoptysis.   Cardiovascular:  Positive for palpitations. Negative for chest pain and leg swelling.  Gastrointestinal:  Positive for constipation. Negative for abdominal pain, blood in stool, diarrhea, nausea and vomiting.  Genitourinary:  Negative for hematuria.   Skin: Negative.   Neurological:  Positive for light-headedness and numbness. Negative for dizziness and headaches.  Hematological:  Does not bruise/bleed easily.      PHYSICAL EXAMINATION: ECOG PERFORMANCE STATUS: 1 - Symptomatic but completely ambulatory  There were no vitals filed for this visit. There were no vitals filed for this visit.  Physical Exam Constitutional:      Appearance: Normal appearance. [name] [name] is obese.  HENT:     Head: Normocephalic and atraumatic.     Mouth/Throat:     Mouth: Mucous membranes are moist.  Eyes:     Extraocular Movements: Extraocular movements intact.     Pupils: Pupils are equal, round, and reactive to light.  Cardiovascular:     Rate and Rhythm: Normal rate and regular rhythm.     Pulses: Normal pulses.     Heart sounds: Normal heart sounds.  Pulmonary:     Effort: Pulmonary effort is normal.     Breath sounds: Normal breath sounds.  Abdominal:     General: Bowel sounds are normal.     Palpations: Abdomen is soft.     Tenderness: There is no abdominal tenderness.  Musculoskeletal:        General: No swelling.     Right lower leg: No edema.     Left lower leg: No edema.  Lymphadenopathy:      Cervical: No cervical adenopathy.  Skin:    General: Skin is warm and dry.  Neurological:     General: No focal deficit present.     Mental Status: [name] [name] is alert and oriented to person, place, and time.  Psychiatric:        Mood and Affect: Mood normal.        Behavior: Behavior normal.       LABORATORY DATA:  I have reviewed the data as listed Recent Results (from the past 2160 hour(s))  Hemoglobin-hemacue, POC     Status: Abnormal   Collection Time: 04/03/22  1:48 PM  Result Value Ref Range   Hemoglobin 9.7 (L) 12.0 - 15.0 g/dL  Hemoglobin-hemacue, POC     Status: Abnormal   Collection Time: 04/17/22  2:29 PM  Result Value Ref Range   Hemoglobin 9.0 (L) 12.0 - 15.0 g/dL  Hemoglobin-hemacue, POC     Status: Abnormal   Collection Time: 05/01/22  1:37 PM  Result Value Ref Range   Hemoglobin 7.9 (L) 12.0 - 15.0 g/dL  Comprehensive metabolic panel     Status: Abnormal   Collection Time: 05/02/22  9:03 AM  Result Value Ref Range   Sodium 138 135 - 145 mmol/L   Potassium 3.7 3.5 - 5.1 mmol/L   Chloride 101 98 - 111 mmol/L   CO2 29 22 - 32 mmol/L   Glucose, Bld 94 70 - 99 mg/dL    Comment: Glucose reference range applies only to samples taken after fasting for at least 8 hours.   BUN 36 (H) 8 - 23 mg/dL   Creatinine, Ser 1.95 (H) 0.44 - 1.00 mg/dL   Calcium 9.3 8.9 - 10.3 mg/dL   Total Protein 7.6 6.5 - 8.1 g/dL   Albumin 4.3 3.5 - 5.0 g/dL   AST 23 15 - 41 U/L  ALT 15 0 - 44 U/L   Alkaline Phosphatase 70 38 - 126 U/L   Total Bilirubin 0.7 0.3 - 1.2 mg/dL   GFR, Estimated 26 (L) >60 mL/min    Comment: (NOTE) Calculated using the CKD-EPI Creatinine Equation (2021)    Anion gap 8 5 - 15    Comment: Performed at [facility], [address] [place], [place] [address]  CBC with Differential     Status: Abnormal   Collection Time: 05/02/22  9:03 AM  Result Value Ref Range   WBC 6.8 4.0 - 10.5 K/uL   RBC 3.03 (L) 3.87 - 5.11 MIL/uL   Hemoglobin 8.4 (L) 12.0 - 15.0 g/dL    HCT 28.2 (L) 36.0 - 46.0 %   MCV 93.1 80.0 - 100.0 fL   MCH 27.7 26.0 - 34.0 pg   MCHC 29.8 (L) 30.0 - 36.0 g/dL   RDW 14.8 11.5 - 15.5 %   Platelets 349 150 - 400 K/uL   nRBC 0.0 0.0 - 0.2 %   Neutrophils Relative % 54 %   Neutro Abs 3.7 1.7 - 7.7 K/uL   Lymphocytes Relative 31 %   Lymphs Abs 2.1 0.7 - 4.0 K/uL   Monocytes Relative 11 %   Monocytes Absolute 0.8 0.1 - 1.0 K/uL   Eosinophils Relative 3 %   Eosinophils Absolute 0.2 0.0 - 0.5 K/uL   Basophils Relative 1 %   Basophils Absolute 0.0 0.0 - 0.1 K/uL   Immature Granulocytes 0 %   Abs Immature Granulocytes 0.01 0.00 - 0.07 K/uL    Comment: Performed at [facility], [address], [place], [place] [address]  Protime-INR     Status: Abnormal   Collection Time: 05/02/22  9:03 AM  Result Value Ref Range   Prothrombin Time 18.4 (H) 11.4 - 15.2 seconds   INR 1.5 (H) 0.8 - 1.2    Comment: (NOTE) INR goal varies based on device and disease states. Performed at [facility], [address], [place], [place] [address]   Type and screen [facility]     Status: None   Collection Time: 05/02/22  9:03 AM  Result Value Ref Range   ABO/RH(D) O POS    Antibody Screen NEG    Sample Expiration 05/05/2022,2359    Unit Number [id number]    Blood Component Type RED CELLS,LR    Unit division 00    Status of Unit ISSUED,FINAL    Transfusion Status OK TO TRANSFUSE    Crossmatch Result      Compatible Performed at [facility], [address] [place], [place] [address]   Prepare RBC (crossmatch)     Status: None   Collection Time: 05/02/22  9:03 AM  Result Value Ref Range   Order Confirmation      ORDER PROCESSED BY BLOOD BANK Performed at [facility], [address], [place],  [address]   BPAM RBC     Status: None   Collection Time: 05/02/22  9:03 AM  Result Value Ref Range   ISSUE DATE / TIME [id number]    Blood Product Unit Number [id number]    PRODUCT CODE [id number]    Unit Type and Rh 5100    Blood  Product Expiration Date [id number]   Brain natriuretic peptide     Status: None   Collection Time: 05/02/22  9:09 AM  Result Value Ref Range   B Natriuretic Peptide 77.0 0.0 - 100.0 pg/mL    Comment: Performed at [facility], [address] [place], [place] [address]  Troponin I (High Sensitivity)     Status: None   Collection Time: 05/02/22  9:09 AM  Result Value Ref Range   Troponin I (High Sensitivity) 11 <18 ng/L    Comment: (NOTE) Elevated high sensitivity troponin I (hsTnI) values and significant  changes across serial measurements may suggest ACS but many other  chronic and acute conditions are known to elevate hsTnI results.  Refer to the "Links" section for chest pain algorithms and additional  guidance. Performed at [facility], [address], [place], [place] [address]   POC occult blood, ED     Status: Abnormal   Collection Time: 05/02/22 10:14 AM  Result Value Ref Range   Fecal Occult Bld POSITIVE (A) NEGATIVE  Troponin I (High Sensitivity)     Status: None   Collection Time: 05/02/22 11:01 AM  Result Value Ref Range   Troponin I (High Sensitivity) 13 <18 ng/L    Comment: (NOTE) Elevated high sensitivity troponin I (hsTnI) values and significant  changes across serial measurements may suggest ACS but many other  chronic and acute conditions are known to elevate hsTnI results.  Refer to the "Links" section for chest pain algorithms and additional  guidance. Performed at [facility], [address], [place], [place] [address]   Hemoglobin-hemacue, POC     Status: Abnormal   Collection Time: 05/15/22 12:37 PM  Result Value Ref Range   Hemoglobin 7.7 (L) 12.0 - 15.0 g/dL  Iron and TIBC     Status: Abnormal   Collection Time: 05/15/22  1:08 PM  Result Value Ref Range   Iron 22 (L) 28 - 170 ug/dL   TIBC 465 (H) 250 - 450 ug/dL   Saturation Ratios 5 (L) 10.4 - 31.8 %   UIBC 443 ug/dL    Comment: Performed at [facility], [address], [place], [place]  [address]  Type and screen     Status: None   Collection Time: 05/22/22  9:05 AM  Result Value Ref Range   ABO/RH(D) O POS    Antibody Screen NEG    Sample Expiration 05/25/2022,2359    Unit Number [id number]    Blood Component Type RED CELLS,LR    Unit division 00    Status of Unit ISSUED,FINAL    Transfusion Status OK TO TRANSFUSE    Crossmatch Result      Compatible Performed at [facility], [address] [place], [place] [address]   Prepare RBC (crossmatch)     Status: None   Collection Time: 05/22/22  9:05 AM  Result Value Ref Range   Order Confirmation      ORDER PROCESSED BY BLOOD BANK Performed at [facility], [address], [place], [place] [address]   BPAM RBC     Status: None   Collection Time: 05/22/22  9:05 AM  Result Value Ref Range   ISSUE DATE / TIME [id number]    Blood Product Unit Number [id number]    PRODUCT CODE [id number]    Unit Type and Rh 5100    Blood Product Expiration Date [id number]   CBC     Status: Abnormal   Collection Time: 05/22/22  9:09 AM  Result Value Ref Range   WBC 6.2 4.0 - 10.5 K/uL   RBC 2.99 (L) 3.87 - 5.11 MIL/uL   Hemoglobin 8.1 (L) 12.0 - 15.0 g/dL   HCT 27.1 (L) 36.0 - 46.0 %   MCV 90.6 80.0 - 100.0 fL  MCH 27.1 26.0 - 34.0 pg   MCHC 29.9 (L) 30.0 - 36.0 g/dL   RDW 15.3 11.5 - 15.5 %   Platelets 366 150 - 400 K/uL   nRBC 0.0 0.0 - 0.2 %    Comment: Performed at [facility], [address], [place], [place] [address]  Basic metabolic panel     Status: Abnormal   Collection Time: 05/22/22  9:09 AM  Result Value Ref Range   Sodium 137 135 - 145 mmol/L   Potassium 4.2 3.5 - 5.1 mmol/L   Chloride 103 98 - 111 mmol/L   CO2 28 22 - 32 mmol/L   Glucose, Bld 112 (H) 70 - 99 mg/dL    Comment: Glucose reference range applies only to samples taken after fasting for at least 8 hours.   BUN 26 (H) 8 - 23 mg/dL   Creatinine, Ser 2.26 (H) 0.44 - 1.00 mg/dL   Calcium 8.9 8.9 - 10.3 mg/dL   GFR, Estimated 22 (L) >60 mL/min     Comment: (NOTE) Calculated using the CKD-EPI Creatinine Equation (2021)    Anion gap 6 5 - 15    Comment: Performed at [facility], [address] [place], [place] [address]  CBC     Status: Abnormal   Collection Time: 05/29/22  1:32 PM  Result Value Ref Range   WBC 6.6 4.0 - 10.5 K/uL   RBC 3.69 (L) 3.87 - 5.11 MIL/uL   Hemoglobin 9.9 (L) 12.0 - 15.0 g/dL   HCT 33.2 (L) 36.0 - 46.0 %   MCV 90.0 80.0 - 100.0 fL   MCH 26.8 26.0 - 34.0 pg   MCHC 29.8 (L) 30.0 - 36.0 g/dL   RDW 14.8 11.5 - 15.5 %   Platelets 358 150 - 400 K/uL   nRBC 0.0 0.0 - 0.2 %    Comment: Performed at [facility], [address], [place], [place] [address]  Renal function panel     Status: Abnormal   Collection Time: 05/29/22  1:32 PM  Result Value Ref Range   Sodium 138 135 - 145 mmol/L   Potassium 3.7 3.5 - 5.1 mmol/L   Chloride 99 98 - 111 mmol/L   CO2 31 22 - 32 mmol/L   Glucose, Bld 97 70 - 99 mg/dL    Comment: Glucose reference range applies only to samples taken after fasting for at least 8 hours.   BUN 35 (H) 8 - 23 mg/dL   Creatinine, Ser 2.05 (H) 0.44 - 1.00 mg/dL   Calcium 9.4 8.9 - 10.3 mg/dL   Phosphorus 3.6 2.5 - 4.6 mg/dL   Albumin 4.6 3.5 - 5.0 g/dL   GFR, Estimated 25 (L) >60 mL/min    Comment: (NOTE) Calculated using the CKD-EPI Creatinine Equation (2021)    Anion gap 8 5 - 15    Comment: Performed at [facility], [address], [place], [place] [address]  Protein / creatinine ratio, urine     Status: None   Collection Time: 05/29/22  1:33 PM  Result Value Ref Range   Creatinine, Urine 31.95 mg/dL   Total Protein, Urine <3.0 mg/dL   Protein Creatinine Ratio        0.00 - 0.15 mg/mg[Cre]    Comment: RESULT BELOW REPORTABLE RANGE, UNABLE TO CALCULATE. Performed at [facility], [address], [place],  [address]   Iron and TIBC     Status: Abnormal   Collection Time: 05/29/22  1:33 PM  Result Value Ref Range  Iron 36 28 - 170 ug/dL   TIBC 478 (H) 250 - 450 ug/dL    Saturation Ratios 8 (L) 10.4 - 31.8 %   UIBC 442 ug/dL    Comment: Performed at [facility], [address] [place], [place] [address]  Hemoglobin-hemacue, POC     Status: Abnormal   Collection Time: 05/29/22  1:43 PM  Result Value Ref Range   Hemoglobin 10.4 (L) 12.0 - 15.0 g/dL  Hemoglobin-hemacue, POC     Status: Abnormal   Collection Time: 06/12/22  1:08 PM  Result Value Ref Range   Hemoglobin 8.1 (L) 12.0 - 15.0 g/dL  Comprehensive metabolic panel     Status: Abnormal   Collection Time: 06/21/22  4:02 PM  Result Value Ref Range   Sodium 136 135 - 145 mmol/L   Potassium 3.5 3.5 - 5.1 mmol/L   Chloride 100 98 - 111 mmol/L   CO2 25 22 - 32 mmol/L   Glucose, Bld 121 (H) 70 - 99 mg/dL    Comment: Glucose reference range applies only to samples taken after fasting for at least 8 hours.   BUN 33 (H) 8 - 23 mg/dL   Creatinine, Ser 2.29 (H) 0.44 - 1.00 mg/dL   Calcium 8.9 8.9 - 10.3 mg/dL   Total Protein 7.2 6.5 - 8.1 g/dL   Albumin 4.3 3.5 - 5.0 g/dL   AST 21 15 - 41 U/L   ALT 12 0 - 44 U/L   Alkaline Phosphatase 59 38 - 126 U/L   Total Bilirubin 0.7 0.3 - 1.2 mg/dL   GFR, Estimated 22 (L) >60 mL/min    Comment: (NOTE) Calculated using the CKD-EPI Creatinine Equation (2021)    Anion gap 11 5 - 15    Comment: Performed at [facility], [address], [place], [place] [address]  CBC with Differential     Status: Abnormal   Collection Time: 06/21/22  4:02 PM  Result Value Ref Range   WBC 9.3 4.0 - 10.5 K/uL   RBC 3.05 (L) 3.87 - 5.11 MIL/uL   Hemoglobin 7.8 (L) 12.0 - 15.0 g/dL   HCT 27.3 (L) 36.0 - 46.0 %   MCV 89.5 80.0 - 100.0 fL   MCH 25.6 (L) 26.0 - 34.0 pg   MCHC 28.6 (L) 30.0 - 36.0 g/dL   RDW 16.0 (H) 11.5 - 15.5 %   Platelets 384 150 - 400 K/uL   nRBC 0.0 0.0 - 0.2 %   Neutrophils Relative % 65 %   Neutro Abs 6.1 1.7 - 7.7 K/uL   Lymphocytes Relative 26 %   Lymphs Abs 2.4 0.7 - 4.0 K/uL   Monocytes Relative 7 %   Monocytes Absolute 0.7 0.1 - 1.0 K/uL    Eosinophils Relative 1 %   Eosinophils Absolute 0.1 0.0 - 0.5 K/uL   Basophils Relative 1 %   Basophils Absolute 0.1 0.0 - 0.1 K/uL   Immature Granulocytes 0 %   Abs Immature Granulocytes 0.03 0.00 - 0.07 K/uL    Comment: Performed at [facility], [address], [place], [place] [address]  Troponin I (High Sensitivity)     Status: None   Collection Time: 06/21/22  4:02 PM  Result Value Ref Range   Troponin I (High Sensitivity) 14 <18 ng/L    Comment: (NOTE) Elevated high sensitivity troponin I (hsTnI) values and significant  changes across serial measurements may suggest ACS but many other  chronic and acute conditions are known to elevate hsTnI results.  Refer to the "Links" section for chest pain algorithms and additional  guidance. Performed at [facility], [address], [place], [place] [address]   Brain natriuretic peptide     Status: Abnormal   Collection Time: 06/21/22  4:02 PM  Result Value Ref Range   B Natriuretic Peptide 171.0 (H) 0.0 - 100.0 pg/mL    Comment: Performed at [facility], [address] [place], [place] [address]  Troponin I (High Sensitivity)     Status: None   Collection Time: 06/21/22  6:31 PM  Result Value Ref Range   Troponin I (High Sensitivity) 17 <18 ng/L    Comment: (NOTE) Elevated high sensitivity troponin I (hsTnI) values and significant  changes across serial measurements may suggest ACS but many other  chronic and acute conditions are known to elevate hsTnI results.  Refer to the "Links" section for chest pain algorithms and additional  guidance. Performed at [facility], [address] [place], [place] [address]   Type and screen [facility]     Status: None   Collection Time: 06/21/22  7:22 PM  Result Value Ref Range   ABO/RH(D) O POS    Antibody Screen NEG    Sample Expiration 06/24/2022,2359    Unit Number [id number]    Blood Component Type RED CELLS,LR    Unit division 00    Status of Unit ISSUED,FINAL     Transfusion Status OK TO TRANSFUSE    Crossmatch Result      Compatible Performed at [facility], [address] [place], [place] [address]   Prepare RBC (crossmatch)     Status: None   Collection Time: 06/21/22  7:22 PM  Result Value Ref Range   Order Confirmation      ORDER PROCESSED BY BLOOD BANK Performed at [facility], [address] [place], [place] [address]   BPAM RBC     Status: None   Collection Time: 06/21/22  7:22 PM  Result Value Ref Range   ISSUE DATE / TIME [id number]    Blood Product Unit Number [id number]    PRODUCT CODE [id number]    Unit Type and Rh 5100    Blood Product Expiration Date [id number]   Basic metabolic panel     Status: Abnormal   Collection Time: 06/22/22  5:05 AM  Result Value Ref Range   Sodium 140 135 - 145 mmol/L   Potassium 3.5 3.5 - 5.1 mmol/L   Chloride 106 98 - 111 mmol/L   CO2 27 22 - 32 mmol/L   Glucose, Bld 98 70 - 99 mg/dL    Comment: Glucose reference range applies only to samples taken after fasting for at least 8 hours.   BUN 31 (H) 8 - 23 mg/dL   Creatinine, Ser 2.24 (H) 0.44 - 1.00 mg/dL   Calcium 8.7 (L) 8.9 - 10.3 mg/dL   GFR, Estimated 22 (L) >60 mL/min    Comment: (NOTE) Calculated using the CKD-EPI Creatinine Equation (2021)    Anion gap 7 5 - 15    Comment: Performed at [facility], [address] [place],  [address]  CBC     Status: Abnormal   Collection Time: 06/22/22  5:05 AM  Result Value Ref Range   WBC 6.7 4.0 - 10.5 K/uL   RBC 3.08 (L) 3.87 - 5.11 MIL/uL   Hemoglobin 8.2 (L) 12.0 - 15.0 g/dL   HCT 27.2 (L) 36.0 - 46.0 %   MCV 88.3 80.0 - 100.0 fL  MCH 26.6 26.0 - 34.0 pg   MCHC 30.1 30.0 - 36.0 g/dL   RDW 15.5 11.5 - 15.5 %   Platelets 327 150 - 400 K/uL   nRBC 0.0 0.0 - 0.2 %    Comment: Performed at [facility], [address] [place], [place] [address]  Iron and TIBC     Status: Abnormal   Collection Time: 06/22/22  5:05 AM  Result Value Ref Range   Iron 21 (L) 28 - 170 ug/dL    TIBC 441 250 - 450 ug/dL   Saturation Ratios 5 (L) 10.4 - 31.8 %   UIBC 420 ug/dL    Comment: Performed at [facility], [address], [place], [place] [address]  Ferritin     Status: None   Collection Time: 06/22/22  5:05 AM  Result Value Ref Range   Ferritin 12 11 - 307 ng/mL    Comment: Performed at [facility], [address], [place], [place] [address]  ECHOCARDIOGRAM COMPLETE     Status: None   Collection Time: 06/22/22  2:06 PM  Result Value Ref Range   Weight 2,821.89 oz   Height 62 in   BP 139/68 mmHg   Single Plane A2C EF 74.3 %   Single Plane A4C EF 83.8 %   Calc EF 78.9 %   AR max vel 2.22 cm2   AV Area VTI 1.94 cm2   AV Mean grad 9.0 mmHg   AV Peak grad 20.3 mmHg   Ao pk vel 2.25 m/s   AV Area mean vel 2.05 cm2   MV VTI 1.82 cm2   Area-P 1/2 1.78 cm2   S' Lateral 2.10 cm  CBC     Status: Abnormal   Collection Time: 06/23/22  6:42 AM  Result Value Ref Range   WBC 7.5 4.0 - 10.5 K/uL   RBC 3.27 (L) 3.87 - 5.11 MIL/uL   Hemoglobin 8.8 (L) 12.0 - 15.0 g/dL   HCT 29.0 (L) 36.0 - 46.0 %   MCV 88.7 80.0 - 100.0 fL   MCH 26.9 26.0 - 34.0 pg   MCHC 30.3 30.0 - 36.0 g/dL   RDW 15.5 11.5 - 15.5 %   Platelets 346 150 - 400 K/uL   nRBC 0.0 0.0 - 0.2 %    Comment: Performed at [facility], [address], [place], [place] [address]  Basic metabolic panel     Status: Abnormal   Collection Time: 06/23/22  6:42 AM  Result Value Ref Range   Sodium 138 135 - 145 mmol/L   Potassium 4.1 3.5 - 5.1 mmol/L   Chloride 103 98 - 111 mmol/L   CO2 29 22 - 32 mmol/L   Glucose, Bld 109 (H) 70 - 99 mg/dL    Comment: Glucose reference range applies only to samples taken after fasting for at least 8 hours.   BUN 27 (H) 8 - 23 mg/dL   Creatinine, Ser 2.19 (H) 0.44 - 1.00 mg/dL   Calcium 9.0 8.9 - 10.3 mg/dL   GFR, Estimated 23 (L) >60 mL/min    Comment: (NOTE) Calculated using the CKD-EPI Creatinine Equation (2021)    Anion gap 6 5 - 15    Comment: Performed at [facility], [address], [place], [place] [address]  Hemoglobin-hemacue, POC     Status: Abnormal   Collection Time: 06/28/22 12:56 PM  Result Value Ref Range   Hemoglobin 9.9 (L) 12.0 - 15.0 g/dL    RADIOGRAPHIC STUDIES: I have  personally reviewed the radiological images as listed and agreed with the findings in the report. ECHOCARDIOGRAM COMPLETE  Result Date: 06/22/2022    ECHOCARDIOGRAM REPORT   Patient Name:   TAKERRA LUPINACCI Gains Date of Exam: 06/22/2022 Medical Rec #:  [id number]       Height:       62.0 in Accession #:    [id number]      Weight:       176.4 lb Date of Birth:  [birth year]       BSA:          1.812 m Patient Age:    77 years        BP:           100/52 mmHg Patient Gender: F               HR:           64 bpm. Exam Location:  [facility] Procedure: 2D Echo, Cardiac Doppler and Color Doppler Indications:    Murmur  History:        Patient has prior history of Echocardiogram examinations, most                 recent 04/27/2021. Arrythmias:Atrial Fibrillation and Bradycardia,                 Signs/Symptoms:Syncope, Chest Pain and Dyspnea; Risk                 Factors:Hypertension and Dyslipidemia.  Sonographer:    Wenda Low Referring Phys: [place]  1. Left ventricular ejection fraction, by estimation, is 70 to 75%. The left ventricle has hyperdynamic function. The left ventricle has no regional wall motion abnormalities. There is severe concentric left ventricular hypertrophy. Left ventricular diastolic parameters are consistent with Grade II diastolic dysfunction (pseudonormalization). Small intracavitary gradient ~ 5 mm Hg.  2. Right ventricular systolic function is normal. The right ventricular size is normal. There is moderately elevated pulmonary artery systolic pressure. The estimated right ventricular systolic pressure is 99.8 mmHg.  3. Left atrial size was severely dilated.  4. A small pericardial effusion is present.  5. The mitral valve is degenerative. No  evidence of mitral valve regurgitation. The mean mitral valve gradient is 3.0 mmHg with average heart rate of 61 bpm. Moderate to severe mitral annular calcification.  6. Tricuspid valve regurgitation is mild to moderate.  7. The aortic valve is tricuspid. There is moderate calcification of the aortic valve. Aortic valve regurgitation is not visualized. Aortic valve sclerosis/calcification is present. Aortic valve mean gradient measures 9.0 mmHg. Aortic valve Vmax measures 2.25 m/s. Comparison(s): Slight worsening in RVSP from 2022. FINDINGS  Left Ventricle: Left ventricular ejection fraction, by estimation, is 70 to 75%. The left ventricle has hyperdynamic function. The left ventricle has no regional wall motion abnormalities. The left ventricular internal cavity size was normal in size. There is severe concentric left ventricular hypertrophy. Left ventricular diastolic parameters are consistent with Grade II diastolic dysfunction (pseudonormalization). Right Ventricle: The right ventricular size is normal. No increase in right ventricular wall thickness. Right ventricular systolic function is normal. There is moderately elevated pulmonary artery systolic pressure. The tricuspid regurgitant velocity is 3.53 m/s, and with an assumed right atrial pressure of 8 mmHg, the estimated right ventricular systolic pressure is 33.8 mmHg. Left Atrium: Left atrial size was severely dilated. Right Atrium: Right atrial size was normal in size. Pericardium: A small pericardial effusion is present. Mitral Valve:  The mitral valve is degenerative in appearance. Moderate to severe mitral annular calcification. No evidence of mitral valve regurgitation. MV peak gradient, 8.4 mmHg. The mean mitral valve gradient is 3.0 mmHg with average heart rate of 61  bpm. Tricuspid Valve: The tricuspid valve is normal in structure. Tricuspid valve regurgitation is mild to moderate. No evidence of tricuspid stenosis. Aortic Valve: The aortic valve is  tricuspid. There is moderate calcification of the aortic valve. Aortic valve regurgitation is not visualized. Aortic valve sclerosis/calcification is present, without any evidence of aortic stenosis. Aortic valve mean gradient measures 9.0 mmHg. Aortic valve peak gradient measures 20.2 mmHg. Aortic valve area, by VTI measures 1.94 cm. Pulmonic Valve: The pulmonic valve was not well visualized. Pulmonic valve regurgitation is mild. No evidence of pulmonic stenosis. Aorta: The aortic root is normal in size and structure and the ascending aorta was not well visualized. IAS/Shunts: No atrial level shunt detected by color flow Doppler.  LEFT VENTRICLE PLAX 2D LVIDd:         4.30 cm     Diastology LVIDs:         2.10 cm     LV e' medial:    4.79 cm/s LV PW:         1.50 cm     LV E/e' medial:  26.9 LV IVS:        1.40 cm     LV e' lateral:   8.05 cm/s LVOT diam:     1.90 cm     LV E/e' lateral: 16.0 LV SV:         97 LV SV Index:   53 LVOT Area:     2.84 cm  LV Volumes (MOD) LV vol d, MOD A2C: 59.6 ml LV vol d, MOD A4C: 42.7 ml LV vol s, MOD A2C: 15.3 ml LV vol s, MOD A4C: 6.9 ml LV SV MOD A2C:     44.3 ml LV SV MOD A4C:     42.7 ml LV SV MOD BP:      41.5 ml RIGHT VENTRICLE RV Basal diam:  3.30 cm RV Mid diam:    2.70 cm RV S prime:     15.00 cm/s TAPSE (M-mode): 2.5 cm LEFT ATRIUM             Index        RIGHT ATRIUM           Index LA diam:        4.80 cm 2.65 cm/m   RA Area:     15.70 cm LA Vol (A2C):   76.3 ml 42.10 ml/m  RA Volume:   39.50 ml  21.80 ml/m LA Vol (A4C):   86.6 ml 47.79 ml/m LA Biplane Vol: 89.3 ml 49.28 ml/m  AORTIC VALVE                     PULMONIC VALVE AV Area (Vmax):    2.22 cm      PV Vmax:       1.18 m/s AV Area (Vmean):   2.05 cm      PV Peak grad:  5.6 mmHg AV Area (VTI):     1.94 cm AV Vmax:           225.00 cm/s AV Vmean:          140.000 cm/s AV VTI:            0.498 m AV Peak Grad:  20.2 mmHg AV Mean Grad:      9.0 mmHg LVOT Vmax:         176.00 cm/s LVOT Vmean:         101.000 cm/s LVOT VTI:          0.341 m LVOT/AV VTI ratio: 0.68  AORTA Ao Root diam: 2.70 cm MITRAL VALVE                TRICUSPID VALVE MV Area (PHT): 1.78 cm     TR Peak grad:   49.8 mmHg MV Area VTI:   1.82 cm     TR Vmax:        353.00 cm/s MV Peak grad:  8.4 mmHg MV Mean grad:  3.0 mmHg     SHUNTS MV Vmax:       1.45 m/s     Systemic VTI:  0.34 m MV Vmean:      85.2 cm/s    Systemic Diam: 1.90 cm MV Decel Time: 427 msec MV E velocity: 129.00 cm/s MV A velocity: 127.00 cm/s MV E/A ratio:  1.02 Rudean Haskell MD Electronically signed by Rudean Haskell MD Signature Date/Time: 06/22/2022/4:33:52 PM    Final    DG Chest 2 View  Result Date: 06/21/2022 CLINICAL DATA:  Dyspnea. EXAM: CHEST - 2 VIEW COMPARISON:  Chest x-ray dated December 05, 2020. FINDINGS: The heart size and mediastinal contours are within normal limits. Both lungs are clear. The visualized skeletal structures are unremarkable. IMPRESSION: No active cardiopulmonary disease. Electronically Signed   By: Titus Dubin M.D.   On: 06/21/2022 16:48    ASSESSMENT & PLAN: 1.  Iron deficiency anemia - Seen at the request of Dr. 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  [name] [name] takes Protonix and self administers SQ octreotide twice daily.  [name] [name] has been recommended for outpatient follow-up with [facility] for possible double-balloon enteroscopy. - Last endoscopic evaluation in December 2021 (Dr. 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 - Recently hospitalized from 06/21/2022 through 06/23/2022 for symptomatic anemia.  Received 1 unit PRBC and IV Feraheme x500 mg during hospitalization. - [name] [name] is on Eliquis for atrial fibrillation, cleared by GI to continue anticoagulation at this time. - Most recent labs (06/22/2022): Significant iron deficiency with ferritin 12, iron saturation 5%, TIBC 441, serum iron 21. Most recent Hgb (06/28/2022) 9.9 with MCV 88.7.  Hgb dropped to 7.8 during recent hospitalization.  Most recent creatinine 2.19/GFR 23. - [name] [name] has intermittent bright red blood per rectum and melena, most recently noted 3 weeks ago. - Symptomatic with fatigue, ice pica, lightheadedness, palpitations, dyspnea on exertion. - Failed to improve on oral iron supplementation, which has since been discontinued. - PLAN: IV Venofer 400 mg x 2 doses. - Retacrit 10,000 units every 2 weeks, starting on 07/12/2022 - Labs today to check for other causes of anemia.  We will include CBC, copper, folate, homocystine, B12, reticulocytes, methylmalonic acid, LDH, immunofixation, free light chains, and SPEP. - Repeat CBC and iron panel with RTC in 2  months with same-day visit  2.  Other history - PMH: CKD stage IV, secondary hyperparathyroidism, chronic diastolic CHF, atrial fibrillation, hypertension, GERD, peripheral arterial disease, mild COPD, and iron deficiency from GI bleeds  - SOCIAL: [name] [name] is retired and lives at home alone.  [name] [name] is able to ambulate short distances without difficulty and is independent with ADLs.  [name] [name] is a former smoker, smokes 1 PPD for 25 years, quit in 1998.  [name] [name] denies any alcohol or illicit drug use. - FAMILY: Family history positive for mother with unspecified anemia.  Her sister had breast cancer.  Maternal grandmother also had breast cancer.   PLAN SUMMARY & DISPOSITION: IV Venofer 400 mg x 2 Retacrit 10,000 units every 2 weeks, first dose on 07/12/2022 Labs today Repeat labs in 2 months with same-day office  visit  All questions were answered. The patient knows to call the clinic with any problems, questions or concerns.   Medical decision making: Moderate  Time spent on visit: I spent 30 minutes counseling the patient face to face. The total time spent in the appointment was 55 minutes and more than 50% was on counseling.  I, Tarri Abernethy PA-C, have seen this patient in conjunction with Dr. Derek Jack.  Greater than 50% of visit was performed by Dr. Delton Coombes.   Harriett Rush, PA-C 07/02/2022 5:39 PM  DR. Julya Alioto: I have independently evaluated this patient and formulated my assessment and plan.  I agree with HPI, assessment and plan written by Casey Burkitt, PA-C.  Patient evaluated for severe iron deficiency anemia in the setting of CKD.  Last ferritin was 12 with saturations 5.  [name] [name] is also simultaneously undergoing work-up for GI blood loss.  Angiodysplasia and AVMs were noted on upper endoscopy.  [name] [name] is having further work-up done at [facility].  We have recommended parenteral iron therapy with Venofer x2 doses.  [name] [name] recently received Feraheme 1 dose while [name] [name] was hospitalized and received blood transfusion.  We will also check for coexisting nutritional deficiencies.  [name] [name] is also receiving Retacrit 6000 units every 2 weeks.  We will increase it to 10,000 units every 2 weeks.  Reevaluate in 2 months.

## 2022-07-02 ENCOUNTER — Inpatient Hospital Stay (HOSPITAL_COMMUNITY): Payer: Medicare HMO | Attending: Hematology | Admitting: Hematology

## 2022-07-02 ENCOUNTER — Inpatient Hospital Stay (HOSPITAL_COMMUNITY): Payer: Medicare HMO

## 2022-07-02 VITALS — BP 141/65 | HR 54 | Temp 98.4°F | Resp 18 | Ht 62.0 in | Wt 173.7 lb

## 2022-07-02 DIAGNOSIS — Z79899 Other long term (current) drug therapy: Secondary | ICD-10-CM | POA: Insufficient documentation

## 2022-07-02 DIAGNOSIS — D649 Anemia, unspecified: Secondary | ICD-10-CM

## 2022-07-02 DIAGNOSIS — K922 Gastrointestinal hemorrhage, unspecified: Secondary | ICD-10-CM | POA: Insufficient documentation

## 2022-07-02 DIAGNOSIS — Z7901 Long term (current) use of anticoagulants: Secondary | ICD-10-CM | POA: Diagnosis not present

## 2022-07-02 DIAGNOSIS — I129 Hypertensive chronic kidney disease with stage 1 through stage 4 chronic kidney disease, or unspecified chronic kidney disease: Secondary | ICD-10-CM | POA: Insufficient documentation

## 2022-07-02 DIAGNOSIS — N2581 Secondary hyperparathyroidism of renal origin: Secondary | ICD-10-CM | POA: Diagnosis not present

## 2022-07-02 DIAGNOSIS — I4891 Unspecified atrial fibrillation: Secondary | ICD-10-CM | POA: Insufficient documentation

## 2022-07-02 DIAGNOSIS — Z87891 Personal history of nicotine dependence: Secondary | ICD-10-CM | POA: Insufficient documentation

## 2022-07-02 DIAGNOSIS — D631 Anemia in chronic kidney disease: Secondary | ICD-10-CM | POA: Diagnosis not present

## 2022-07-02 DIAGNOSIS — D500 Iron deficiency anemia secondary to blood loss (chronic): Secondary | ICD-10-CM | POA: Insufficient documentation

## 2022-07-02 DIAGNOSIS — N184 Chronic kidney disease, stage 4 (severe): Secondary | ICD-10-CM | POA: Diagnosis not present

## 2022-07-02 LAB — CBC WITH DIFFERENTIAL/PLATELET
Abs Immature Granulocytes: 0 10*3/uL (ref 0.00–0.07)
Basophils Absolute: 0 10*3/uL (ref 0.0–0.1)
Basophils Relative: 1 %
Eosinophils Absolute: 0.2 10*3/uL (ref 0.0–0.5)
Eosinophils Relative: 4 %
HCT: 33 % — ABNORMAL LOW (ref 36.0–46.0)
Hemoglobin: 9.7 g/dL — ABNORMAL LOW (ref 12.0–15.0)
Immature Granulocytes: 0 %
Lymphocytes Relative: 33 %
Lymphs Abs: 1.8 10*3/uL (ref 0.7–4.0)
MCH: 27.1 pg (ref 26.0–34.0)
MCHC: 29.4 g/dL — ABNORMAL LOW (ref 30.0–36.0)
MCV: 92.2 fL (ref 80.0–100.0)
Monocytes Absolute: 0.4 10*3/uL (ref 0.1–1.0)
Monocytes Relative: 7 %
Neutro Abs: 3 10*3/uL (ref 1.7–7.7)
Neutrophils Relative %: 55 %
Platelets: 274 10*3/uL (ref 150–400)
RBC: 3.58 MIL/uL — ABNORMAL LOW (ref 3.87–5.11)
RDW: 17.3 % — ABNORMAL HIGH (ref 11.5–15.5)
WBC: 5.3 10*3/uL (ref 4.0–10.5)
nRBC: 0 % (ref 0.0–0.2)

## 2022-07-02 LAB — RETICULOCYTES
Immature Retic Fract: 29.9 % — ABNORMAL HIGH (ref 2.3–15.9)
RBC.: 3.58 MIL/uL — ABNORMAL LOW (ref 3.87–5.11)
Retic Count, Absolute: 132.8 10*3/uL (ref 19.0–186.0)
Retic Ct Pct: 3.7 % — ABNORMAL HIGH (ref 0.4–3.1)

## 2022-07-02 LAB — VITAMIN B12: Vitamin B-12: 505 pg/mL (ref 180–914)

## 2022-07-02 LAB — FOLATE: Folate: 18.9 ng/mL (ref >5.9)

## 2022-07-02 LAB — HEMOGLOBIN AND HEMATOCRIT, BLOOD
HCT: 31.3 % — ABNORMAL LOW (ref 35.0–45.0)
Hemoglobin: 9.6 g/dL — ABNORMAL LOW (ref 11.7–15.5)

## 2022-07-02 LAB — LACTATE DEHYDROGENASE: LDH: 140 U/L (ref 98–192)

## 2022-07-02 NOTE — Patient Instructions (Addendum)
[place] at [facility] Discharge Instructions  You were seen today by Dr. Delton Coombes & Tarri Abernethy PA-C for your anemia.  Your anemia is most likely secondary to your chronic kidney disease and iron deficiency from your history of GI bleeding.  We will check additional labs TODAY to make sure you do not have any other cause of your anemia.  We will treat your iron deficiency with IV iron x2 doses.  We will treat your anemia of chronic kidney disease with Retacrit injections.  (You will be receiving these here at the [facility] now, instead of at [facility].  You will need to have your labs checked downstairs before each injection.)  We will recheck labs and see you for office visit in 2 months.  Continue to follow-up with Dr. Theador Hawthorne for your chronic kidney disease, and Dr. Jenetta Downer as well as [place] [facility] for your chronic GI bleeding.   Thank you for choosing [place] at [facility] to provide your oncology and hematology care.  To afford each patient quality time with our provider, please arrive at least 15 minutes before your scheduled appointment time.   If you have a lab appointment with the [facility] please come in thru the Main Entrance and check in at the main information desk.  You need to re-schedule your appointment should you arrive 10 or more minutes late.  We strive to give you quality time with our providers, and arriving late affects you and other patients whose appointments are after yours.  Also, if you no show three or more times for appointments you may be dismissed from the clinic at the providers discretion.     Again, thank you for choosing [facility].  Our hope is that these requests will decrease the amount of time that you wait before being seen by our physicians.       _____________________________________________________________  Should you have questions after your visit to  [facility], please contact our office at [phone] and follow the prompts.  Our office hours are 8:00 a.m. and 4:30 p.m. Monday - Friday.  Please note that voicemails left after 4:00 p.m. may not be returned until the following business day.  We are closed weekends and major holidays.  You do have access to a nurse 24-7, just call the main number to the clinic [phone] and do not press any options, hold on the line and a nurse will answer the phone.    For prescription refill requests, have your pharmacy contact our office and allow 72 hours.    Due to Covid, you will need to wear a mask upon entering the hospital. If you do not have a mask, a mask will be given to you at the Main Entrance upon arrival. For doctor visits, patients may have 1 support person age 75 or older with them. For treatment visits, patients can not have anyone with them due to social distancing guidelines and our immunocompromised population.

## 2022-07-03 LAB — KAPPA/LAMBDA LIGHT CHAINS
Kappa free light chain: 43.8 mg/L — ABNORMAL HIGH (ref 3.3–19.4)
Kappa, lambda light chain ratio: 2.45 — ABNORMAL HIGH (ref 0.26–1.65)
Lambda free light chains: 17.9 mg/L (ref 5.7–26.3)

## 2022-07-03 LAB — HOMOCYSTEINE: Homocysteine: 14.9 umol/L (ref 0.0–19.2)

## 2022-07-04 ENCOUNTER — Ambulatory Visit: Payer: Medicare HMO | Admitting: Internal Medicine

## 2022-07-04 ENCOUNTER — Encounter: Payer: Self-pay | Admitting: Internal Medicine

## 2022-07-04 VITALS — BP 128/58 | HR 56 | Ht 62.0 in | Wt 176.2 lb

## 2022-07-04 DIAGNOSIS — I480 Paroxysmal atrial fibrillation: Secondary | ICD-10-CM

## 2022-07-04 LAB — METHYLMALONIC ACID, SERUM: Methylmalonic Acid, Quantitative: 255 nmol/L (ref 0–378)

## 2022-07-04 NOTE — Patient Instructions (Signed)
Medication Instructions:   Take an extra flecainide 50 mg if you goes out of rhythm.   *If you need a refill on your cardiac medications before your next appointment, please call your pharmacy*   Lab Work: NONE   If you have labs (blood work) drawn today and your tests are completely normal, you will receive your results only by: [place] (if you have MyChart) OR A paper copy in the mail If you have any lab test that is abnormal or we need to change your treatment, we will call you to review the results.   Testing/Procedures: NONE    Follow-Up: At [facility], you and your health needs are our priority.  As part of our continuing mission to provide you with exceptional heart care, we have created designated Provider Care Teams.  These Care Teams include your primary Cardiologist (physician) and Advanced Practice Providers (APPs -  Physician Assistants and Nurse Practitioners) who all work together to provide you with the care you need, when you need it.  We recommend signing up for the patient portal called "MyChart".  Sign up information is provided on this After Visit Summary.  MyChart is used to connect with patients for Virtual Visits (Telemedicine).  Patients are able to view lab/test results, encounter notes, upcoming appointments, etc.  Non-urgent messages can be sent to your provider as well.   To learn more about what you can do with MyChart, go to [email or web address].    Your next appointment:   1 year(s)  The format for your next appointment:   In Person  Provider:   Cristopher Peru, MD    Other Instructions Thank you for choosing [facility]!    Important Information About Sugar

## 2022-07-04 NOTE — Progress Notes (Signed)
HPI [name] returns today for followup of her atrial fib and HTN. She is a pleasant 76 yo woman with PAF, who I saw a couple of months ago. She was started on flecainide 75 mg twice daily but reduced to 50 mg twice daily. Her symptoms of atrial fib have essentially resolved with only rare break through. She denies chest pain or sob. She saw Dr.Lambert in consultation about a Watchman and he thought she was not a good candidate. She has gotten back on eliquis but has not had any bleeding. She denies any symptomatic atrial fib.  Allergies  Allergen Reactions   Amlodipine     Felt she retained fluid in her chest    Amoxicillin Hives    Did it involve swelling of the face/tongue/throat, SOB, or low BP? No Did it involve sudden or severe rash/hives, skin peeling, or any reaction on the inside of your mouth or nose? No Did you need to seek medical attention at a hospital or doctor's office? No When did it last happen?      10 + years If all above answers are "NO", may proceed with cephalosporin use.    Doxycycline     " sick and weak"   Acetaminophen-Codeine Hives and Rash   Allopurinol Hives and Rash   Tramadol Hives and Rash     Current Outpatient Medications  Medication Sig Dispense Refill   acetaminophen (TYLENOL) 500 MG tablet Take 1,000 mg by mouth every 6 (six) hours as needed for moderate pain.     albuterol (VENTOLIN HFA) 108 (90 Base) MCG/ACT inhaler Inhale 2 puffs into the lungs every 6 (six) hours as needed for wheezing or shortness of breath. 18 g 1   calcitRIOL (ROCALTROL) 0.25 MCG capsule Take 0.25 mcg by mouth 3 (three) times a week. Monday,Wednesday and Fridays     Cholecalciferol (VITAMIN D-3) 25 MCG (1000 UT) CAPS Take 1,000 Units by mouth daily.     cyclobenzaprine (FLEXERIL) 5 MG tablet Take 5 mg by mouth 3 (three) times daily as needed for muscle spasms.     diclofenac Sodium (VOLTAREN) 1 % GEL Apply 1 application topically 4 (four) times daily as needed  (pain).     ELIQUIS 5 MG TABS tablet TAKE 1 TABLET BY MOUTH TWICE A DAY 180 tablet 1   flecainide (TAMBOCOR) 50 MG tablet TAKE 1 TABLET BY MOUTH TWICE A DAY 60 tablet 5   furosemide (LASIX) 20 MG tablet Take 1 tablet (20 mg total) by mouth See admin instructions. Take 40 mg in the morning and 20 mg every evening (Patient taking differently: Take 20 mg by mouth See admin instructions. Take 20 mg in the morning) 90 tablet 3   hydrocortisone (ANUSOL-HC) 2.5 % rectal cream Place 1 application rectally 2 (two) times daily. 30 g 1   losartan (COZAAR) 50 MG tablet Take 1 tablet (50 mg total) by mouth daily. 90 tablet 3   lubiprostone (AMITIZA) 24 MCG capsule Take 1 capsule (24 mcg total) by mouth in the morning and at bedtime. 180 capsule 3   octreotide (SANDOSTATIN) 100 MCG/ML SOLN injection Inject 1 mL (100 mcg total) into the skin every 12 (twelve) hours. 60 mL 11   ondansetron (ZOFRAN) 4 MG tablet Take 1 tablet (4 mg total) by mouth every 6 (six) hours as needed for nausea. 20 tablet 0   pantoprazole (PROTONIX) 40 MG tablet Take 1 tablet (40 mg total) by mouth daily. 90 tablet 3  polyethylene glycol (MIRALAX / GLYCOLAX) 17 g packet Take 17 g by mouth daily as needed for mild constipation. 14 each 0   potassium chloride SA (KLOR-CON) 20 MEQ tablet Take 20 mEq by mouth in the morning, at noon, and at bedtime.     rosuvastatin (CRESTOR) 10 MG tablet TAKE 1 TABLET BY MOUTH EVERY DAY 30 tablet 0   vitamin C (ASCORBIC ACID) 250 MG tablet Take 250 mg by mouth daily.     No current facility-administered medications for this visit.     Past Medical History:  Diagnosis Date   Anemia    Chronic kidney disease    Constipation 07/17/2017   GERD (gastroesophageal reflux disease)    Gout    Grade II diastolic dysfunction 81/07/2992   Hyperlipidemia    Hypertension    Hypokalemia    OSA (obstructive sleep apnea) 08/09/2020   PAF (paroxysmal atrial fibrillation) (HCC)    PAF (paroxysmal atrial  fibrillation) (HCC)    PUD (peptic ulcer disease)    Syncope    Neurocardiogenic    ROS:   All systems reviewed and negative except as noted in the HPI.   Past Surgical History:  Procedure Laterality Date   ABDOMINAL AORTOGRAM W/LOWER EXTREMITY Right 12/13/2020   Procedure: ABDOMINAL AORTOGRAM W/LOWER EXTREMITY;  Surgeon: Serafina Mitchell, MD;  Location: [facility];  Service: Cardiovascular;  Laterality: Right;   ABDOMINAL HYSTERECTOMY     AMPUTATION Right 12/14/2020   Procedure: Right fifth toe amputation;  Surgeon: Rosetta Posner, MD;  Location: [facility];  Service: Vascular;  Laterality: Right;   CATARACT EXTRACTION Bilateral    COLONOSCOPY N/A 10/07/2014   Dr. Oneida Alar: redundant left colon, moderate sized external hemorrhoids    COLONOSCOPY N/A 03/11/2020   Surgeon: Danie Binder, MD;  9 polyps, majority of which were tubular adenomas, nodular mucosa in the anus s/p biopsy which was benign.   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    HOT HEMOSTASIS  11/27/2020   Procedure: HOT HEMOSTASIS (ARGON PLASMA COAGULATION/BICAP);  Surgeon: Montez Morita, Quillian Quince, MD;  [facility];  Service: Gastroenterology;;   LIPOMA RESECTION     PERIPHERAL VASCULAR INTERVENTION Right 12/13/2020   Procedure: PERIPHERAL VASCULAR INTERVENTION;  Surgeon: Serafina Mitchell, MD;  Location: [facility] [facility];  Service: Cardiovascular;  Laterality: Right;  SFA/PT/AT   POLYPECTOMY  03/11/2020   Procedure: POLYPECTOMY;  Surgeon: Danie Binder, MD;   [facility];  Service: Endoscopy;;  cecal, transverse, descending, sigmoid   TEE WITHOUT CARDIOVERSION N/A 05/10/2021   Procedure: TRANSESOPHAGEAL ECHOCARDIOGRAM (TEE) WATCHMEN EVALUATION;  Surgeon: Josue Hector, MD;  Location: [facility];  Service: Cardiovascular;  Laterality: N/A;     Family History  Problem Relation Age of Onset   Stroke Mother    Dementia Father    Cancer - Other Sister    Atrial fibrillation Sister    Heart failure Sister    Colon cancer Maternal Grandmother        diagnosed in her 8s     Social History  Socioeconomic History   Marital status: Single    Spouse name: Not on file   Number of children: Not on file   Years of education: Not on file   Highest education level: Not on file  Occupational History   Occupation: Retired  Tobacco Use   Smoking status: Former    Packs/day: 1.00    Years: 20.00    Total pack years: 20.00    Types: Cigarettes    Start date: 03/16/1964    Quit date: 10/07/1997    Years since quitting: 24.7   Smokeless tobacco: Never  Vaping Use   Vaping Use: Never used  Substance and Sexual Activity   Alcohol use: No    Alcohol/week: 0.0 standard drinks of alcohol   Drug use: No   Sexual activity: Yes    Partners: Male  Other Topics Concern   Not on file  Social History Narrative   Not on file   Social Determinants of Health   Financial Resource Strain: Not on file  Food Insecurity: Not on file  Transportation Needs: Not on file  Physical Activity: Not on file  Stress: Not on file  Social Connections: Not on file  Intimate Partner Violence: Not on file     BP (!) 128/58   Pulse (!) 56   Ht '5\' 2"'$  (1.575 m)   Wt 176 lb 3.2 oz (79.9 kg)   SpO2 99%   BMI 32.23 kg/m   Physical Exam:  Well appearing NAD HEENT: Unremarkable Neck:  No JVD, no thyromegally Lymphatics:  No adenopathy Back:  No CVA tenderness Lungs:  Clear with no wheezes HEART:  Regular rate rhythm, 1/6 systolic murmurs, no  rubs, no clicks Abd:  soft, positive bowel sounds, no organomegally, no rebound, no guarding Ext:  2 plus pulses, no edema, no cyanosis, no clubbing Skin:  No rashes no nodules Neuro:  CN II through XII intact, motor grossly intact  Assess/Plan:  1. Atrial fib - her symptoms are much improved on flecainide. She will continue her current meds at 50 bid. If she has break through, I asked her to take an extra flecainide, up to 200 mg in a 24 hour period. 2. HTN - her bp is well controlled.  3. GI bleeding - she was prescribed octreotide and she has not had more bleeding and is on eliquis. She has not been thought to be a good candidate for anti-coagulation. 4. Obesity - I asked her to lose 10-15 lbs.      Carleene Overlie [name] Teagarden,MD

## 2022-07-05 LAB — PROTEIN ELECTROPHORESIS, SERUM
A/G Ratio: 1.4 (ref 0.7–1.7)
Albumin ELP: 3.8 g/dL (ref 2.9–4.4)
Alpha-1-Globulin: 0.2 g/dL (ref 0.0–0.4)
Alpha-2-Globulin: 0.7 g/dL (ref 0.4–1.0)
Beta Globulin: 0.8 g/dL (ref 0.7–1.3)
Gamma Globulin: 0.9 g/dL (ref 0.4–1.8)
Globulin, Total: 2.7 g/dL (ref 2.2–3.9)
Total Protein ELP: 6.5 g/dL (ref 6.0–8.5)

## 2022-07-05 LAB — COPPER, SERUM: Copper: 107 ug/dL (ref 80–158)

## 2022-07-09 ENCOUNTER — Inpatient Hospital Stay (HOSPITAL_COMMUNITY): Payer: Medicare HMO

## 2022-07-09 VITALS — BP 129/52 | HR 71 | Temp 96.5°F | Resp 18

## 2022-07-09 DIAGNOSIS — I129 Hypertensive chronic kidney disease with stage 1 through stage 4 chronic kidney disease, or unspecified chronic kidney disease: Secondary | ICD-10-CM | POA: Diagnosis not present

## 2022-07-09 DIAGNOSIS — D500 Iron deficiency anemia secondary to blood loss (chronic): Secondary | ICD-10-CM

## 2022-07-09 LAB — IMMUNOFIXATION ELECTROPHORESIS
IgA: 205 mg/dL (ref 64–422)
IgG (Immunoglobin G), Serum: 945 mg/dL (ref 586–1602)
IgM (Immunoglobulin M), Srm: 63 mg/dL (ref 26–217)
Total Protein ELP: 6.3 g/dL (ref 6.0–8.5)

## 2022-07-09 MED ORDER — SODIUM CHLORIDE 0.9 % IV SOLN: Freq: Once | INTRAVENOUS | Status: AC

## 2022-07-09 MED ORDER — LORATADINE 10 MG PO TABS
10.0000 mg | ORAL_TABLET | Freq: Once | ORAL | Status: AC
  Administered 2022-07-09: 10 mg via ORAL
  Filled 2022-07-09: qty 1

## 2022-07-09 MED ORDER — ACETAMINOPHEN 325 MG PO TABS
650.0000 mg | ORAL_TABLET | Freq: Once | ORAL | Status: AC
  Administered 2022-07-09: 650 mg via ORAL
  Filled 2022-07-09: qty 2

## 2022-07-09 MED ORDER — SODIUM CHLORIDE 0.9 % IV SOLN
400.0000 mg | Freq: Once | INTRAVENOUS | Status: AC
  Administered 2022-07-09: 400 mg via INTRAVENOUS
  Filled 2022-07-09: qty 20

## 2022-07-09 NOTE — Patient Instructions (Signed)
[facility]  Discharge Instructions: Thank you for choosing [facility] to provide your oncology and hematology care.  If you have a lab appointment with the Cancer Center, please come in thru the Main Entrance and check in at the main information desk.  Wear comfortable clothing and clothing appropriate for easy access to any Portacath or PICC line.   We strive to give you quality time with your provider. You may need to reschedule your appointment if you arrive late (15 or more minutes).  Arriving late affects you and other patients whose appointments are after yours.  Also, if you miss three or more appointments without notifying the office, you may be dismissed from the clinic at the provider's discretion.      For prescription refill requests, have your pharmacy contact our office and allow 72 hours for refills to be completed.    Today you received Venofer IV iron infusion.     BELOW ARE SYMPTOMS THAT SHOULD BE REPORTED IMMEDIATELY: *FEVER GREATER THAN 100.4 F (38 C) OR HIGHER *CHILLS OR SWEATING *NAUSEA AND VOMITING THAT IS NOT CONTROLLED WITH YOUR NAUSEA MEDICATION *UNUSUAL SHORTNESS OF BREATH *UNUSUAL BRUISING OR BLEEDING *URINARY PROBLEMS (pain or burning when urinating, or frequent urination) *BOWEL PROBLEMS (unusual diarrhea, constipation, pain near the anus) TENDERNESS IN MOUTH AND THROAT WITH OR WITHOUT PRESENCE OF ULCERS (sore throat, sores in mouth, or a toothache) UNUSUAL RASH, SWELLING OR PAIN  UNUSUAL VAGINAL DISCHARGE OR ITCHING   Items with * indicate a potential emergency and should be followed up as soon as possible or go to the Emergency Department if any problems should occur.  Please show the CHEMOTHERAPY ALERT CARD or IMMUNOTHERAPY ALERT CARD at check-in to the Emergency Department and triage nurse.  Should you have questions after your visit or need to cancel or reschedule your appointment, please contact [facility]  [phone]  and follow the prompts.  Office hours are 8:00 a.m. to 4:30 p.m. Monday - Friday. Please note that voicemails left after 4:00 p.m. may not be returned until the following business day.  We are closed weekends and major holidays. You have access to a nurse at all times for urgent questions. Please call the main number to the clinic [phone] and follow the prompts.  For any non-urgent questions, you may also contact your provider using MyChart. We now offer e-Visits for anyone 18 and older to request care online for non-urgent symptoms. For details visit [email or web address].   Also download the MyChart app! Go to the app store, search "MyChart", open the app, select [facility], and log in with your MyChart username and password.  Masks are optional in the cancer centers. If you would like for your care team to wear a mask while they are taking care of you, please let them know. For doctor visits, patients may have with them one support person who is at least 76 years old. At this time, visitors are not allowed in the infusion area.  

## 2022-07-09 NOTE — Progress Notes (Signed)
Pt presents today for Venofer IV iron infusion per provider's order. Vital signs stable and pt voiced no new complaints at this time. ° °Peripheral IV started with good blood return pre and post infusion. ° °Venofer 400 mg  given today per MD orders. Tolerated infusion without adverse affects. Vital signs stable. No complaints at this time. Discharged from clinic via wheelchair in stable condition. Alert and oriented x 3. F/U with [facility] as scheduled.   °

## 2022-07-12 ENCOUNTER — Encounter (HOSPITAL_COMMUNITY): Payer: Medicare HMO

## 2022-07-13 ENCOUNTER — Inpatient Hospital Stay (HOSPITAL_COMMUNITY): Payer: Medicare HMO

## 2022-07-13 VITALS — BP 135/76 | HR 52 | Temp 96.0°F | Resp 18

## 2022-07-13 DIAGNOSIS — D631 Anemia in chronic kidney disease: Secondary | ICD-10-CM

## 2022-07-13 DIAGNOSIS — D500 Iron deficiency anemia secondary to blood loss (chronic): Secondary | ICD-10-CM

## 2022-07-13 DIAGNOSIS — I129 Hypertensive chronic kidney disease with stage 1 through stage 4 chronic kidney disease, or unspecified chronic kidney disease: Secondary | ICD-10-CM | POA: Diagnosis not present

## 2022-07-13 LAB — CBC
HCT: 33.2 % — ABNORMAL LOW (ref 36.0–46.0)
Hemoglobin: 9.9 g/dL — ABNORMAL LOW (ref 12.0–15.0)
MCH: 27.7 pg (ref 26.0–34.0)
MCHC: 29.8 g/dL — ABNORMAL LOW (ref 30.0–36.0)
MCV: 92.7 fL (ref 80.0–100.0)
Platelets: 293 10*3/uL (ref 150–400)
RBC: 3.58 MIL/uL — ABNORMAL LOW (ref 3.87–5.11)
RDW: 17.3 % — ABNORMAL HIGH (ref 11.5–15.5)
WBC: 5.3 10*3/uL (ref 4.0–10.5)
nRBC: 0 % (ref 0.0–0.2)

## 2022-07-13 MED ORDER — EPOETIN ALFA-EPBX 10000 UNIT/ML IJ SOLN
10000.0000 [IU] | Freq: Once | INTRAMUSCULAR | Status: AC
  Administered 2022-07-13: 10000 [IU] via SUBCUTANEOUS
  Filled 2022-07-13: qty 1

## 2022-07-13 NOTE — Patient Instructions (Signed)
[facility]  Discharge Instructions: Thank you for choosing [facility] to provide your oncology and hematology care.  If you have a lab appointment with the [facility], please come in thru the Main Entrance and check in at the main information desk.  Wear comfortable clothing and clothing appropriate for easy access to any Portacath or PICC line.   We strive to give you quality time with your provider. You may need to reschedule your appointment if you arrive late (15 or more minutes).  Arriving late affects you and other patients whose appointments are after yours.  Also, if you miss three or more appointments without notifying the office, you may be dismissed from the clinic at the provider's discretion.      For prescription refill requests, have your pharmacy contact our office and allow 72 hours for refills to be completed.    Today you received Retacrit injection.     BELOW ARE SYMPTOMS THAT SHOULD BE REPORTED IMMEDIATELY: *FEVER GREATER THAN 100.4 F (38 C) OR HIGHER *CHILLS OR SWEATING *NAUSEA AND VOMITING THAT IS NOT CONTROLLED WITH YOUR NAUSEA MEDICATION *UNUSUAL SHORTNESS OF BREATH *UNUSUAL BRUISING OR BLEEDING *URINARY PROBLEMS (pain or burning when urinating, or frequent urination) *BOWEL PROBLEMS (unusual diarrhea, constipation, pain near the anus) TENDERNESS IN MOUTH AND THROAT WITH OR WITHOUT PRESENCE OF ULCERS (sore throat, sores in mouth, or a toothache) UNUSUAL RASH, SWELLING OR PAIN  UNUSUAL VAGINAL DISCHARGE OR ITCHING   Items with * indicate a potential emergency and should be followed up as soon as possible or go to the Emergency Department if any problems should occur.  Please show the CHEMOTHERAPY ALERT CARD or IMMUNOTHERAPY ALERT CARD at check-in to the Emergency Department and triage nurse.  Should you have questions after your visit or need to cancel or reschedule your appointment, please contact [facility]  [phone]  and follow the prompts.  Office hours are 8:00 a.m. to 4:30 p.m. Monday - Friday. Please note that voicemails left after 4:00 p.m. may not be returned until the following business day.  We are closed weekends and major holidays. You have access to a nurse at all times for urgent questions. Please call the main number to the clinic [phone] and follow the prompts.  For any non-urgent questions, you may also contact your provider using MyChart. We now offer e-Visits for anyone 37 and older to request care online for non-urgent symptoms. For details visit [email or web address].   Also download the MyChart app! Go to the app store, search "MyChart", open the app, select [place], and log in with your MyChart username and password.  Masks are optional in the cancer centers. If you would like for your care team to wear a mask while they are taking care of you, please let them know. For doctor visits, patients may have with them one support person who is at least 76 years old. At this time, visitors are not allowed in the infusion area.

## 2022-07-13 NOTE — Progress Notes (Signed)
[name] [name] presents today for injection per the provider's orders.  Retacrit 10,000 units  administration without incident; injection site WNL; see MAR for injection details.  Patient tolerated procedure well and without incident.  No questions or complaints noted at this time. Pt's hemoglobin noted to be 9.9 today.  Discharged from clinic via wheelchair  in stable condition. Alert and oriented x 3. F/U with [facility] as scheduled.

## 2022-07-16 ENCOUNTER — Inpatient Hospital Stay (HOSPITAL_COMMUNITY): Payer: Medicare HMO

## 2022-07-16 VITALS — BP 147/70 | HR 75 | Temp 96.6°F | Resp 18

## 2022-07-16 DIAGNOSIS — D500 Iron deficiency anemia secondary to blood loss (chronic): Secondary | ICD-10-CM

## 2022-07-16 DIAGNOSIS — I129 Hypertensive chronic kidney disease with stage 1 through stage 4 chronic kidney disease, or unspecified chronic kidney disease: Secondary | ICD-10-CM | POA: Diagnosis not present

## 2022-07-16 MED ORDER — ACETAMINOPHEN 325 MG PO TABS
650.0000 mg | ORAL_TABLET | Freq: Once | ORAL | Status: AC
  Administered 2022-07-16: 650 mg via ORAL
  Filled 2022-07-16: qty 2

## 2022-07-16 MED ORDER — LORATADINE 10 MG PO TABS
10.0000 mg | ORAL_TABLET | Freq: Once | ORAL | Status: AC
  Administered 2022-07-16: 10 mg via ORAL
  Filled 2022-07-16: qty 1

## 2022-07-16 MED ORDER — SODIUM CHLORIDE 0.9 % IV SOLN: Freq: Once | INTRAVENOUS | Status: AC

## 2022-07-16 MED ORDER — SODIUM CHLORIDE 0.9 % IV SOLN
400.0000 mg | Freq: Once | INTRAVENOUS | Status: AC
  Administered 2022-07-16: 400 mg via INTRAVENOUS
  Filled 2022-07-16: qty 20

## 2022-07-16 NOTE — Patient Instructions (Signed)
[facility]  Discharge Instructions: Thank you for choosing [facility] to provide your oncology and hematology care.  If you have a lab appointment with the Cancer Center, please come in thru the Main Entrance and check in at the main information desk.  Wear comfortable clothing and clothing appropriate for easy access to any Portacath or PICC line.   We strive to give you quality time with your provider. You may need to reschedule your appointment if you arrive late (15 or more minutes).  Arriving late affects you and other patients whose appointments are after yours.  Also, if you miss three or more appointments without notifying the office, you may be dismissed from the clinic at the provider's discretion.      For prescription refill requests, have your pharmacy contact our office and allow 72 hours for refills to be completed.    Today you received Venofer IV iron infusion.     BELOW ARE SYMPTOMS THAT SHOULD BE REPORTED IMMEDIATELY: *FEVER GREATER THAN 100.4 F (38 C) OR HIGHER *CHILLS OR SWEATING *NAUSEA AND VOMITING THAT IS NOT CONTROLLED WITH YOUR NAUSEA MEDICATION *UNUSUAL SHORTNESS OF BREATH *UNUSUAL BRUISING OR BLEEDING *URINARY PROBLEMS (pain or burning when urinating, or frequent urination) *BOWEL PROBLEMS (unusual diarrhea, constipation, pain near the anus) TENDERNESS IN MOUTH AND THROAT WITH OR WITHOUT PRESENCE OF ULCERS (sore throat, sores in mouth, or a toothache) UNUSUAL RASH, SWELLING OR PAIN  UNUSUAL VAGINAL DISCHARGE OR ITCHING   Items with * indicate a potential emergency and should be followed up as soon as possible or go to the Emergency Department if any problems should occur.  Please show the CHEMOTHERAPY ALERT CARD or IMMUNOTHERAPY ALERT CARD at check-in to the Emergency Department and triage nurse.  Should you have questions after your visit or need to cancel or reschedule your appointment, please contact [facility]  [phone]  and follow the prompts.  Office hours are 8:00 a.m. to 4:30 p.m. Monday - Friday. Please note that voicemails left after 4:00 p.m. may not be returned until the following business day.  We are closed weekends and major holidays. You have access to a nurse at all times for urgent questions. Please call the main number to the clinic [phone] and follow the prompts.  For any non-urgent questions, you may also contact your provider using MyChart. We now offer e-Visits for anyone 18 and older to request care online for non-urgent symptoms. For details visit [email or web address].   Also download the MyChart app! Go to the app store, search "MyChart", open the app, select [facility], and log in with your MyChart username and password.  Masks are optional in the cancer centers. If you would like for your care team to wear a mask while they are taking care of you, please let them know. For doctor visits, patients may have with them one support person who is at least 76 years old. At this time, visitors are not allowed in the infusion area.  

## 2022-07-16 NOTE — Progress Notes (Signed)
Pt presents today for Venofer IV iron infusion per provider's order. Vital signs stable and pt voiced no new complaints at this time. ° °Peripheral IV started with good blood return pre and post infusion. ° °Venofer 400 mg  given today per MD orders. Tolerated infusion without adverse affects. Vital signs stable. No complaints at this time. Discharged from clinic via wheelchair in stable condition. Alert and oriented x 3. F/U with [facility] as scheduled.   °

## 2022-07-24 NOTE — Progress Notes (Signed)
GI Office Note    Referring Provider: Andres Shad, * Primary Care Physician:  Andres Shad, MD Primary Gastroenterologist: Dr. Abbey Chatters  Date:  07/30/2022  ID:  [name] [name], DOB [birth year], MRN [id number]   Chief Complaint   Chief Complaint  Patient presents with   Follow-up    History of Present Illness  [name] [name] is a 76 y.o. 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  Patient has had multiple hospitalizations for acute blood loss anemia secondary to small bowel AVMs.  She had EGD in December 2021 which was relatively unremarkable and has subsequent small bowel capsule endoscopy revealing multiple small bowel AVMs and underwent enteroscopy with APC of multiple AVMs on 11/27/20.  She subsequently suffered a syncopal episode at home on 12/05/2020 for anemia and was started on subcutaneous octreotide during that hospitalization.  In April 2022 she again had worsening anemia and presented to the hospital for blood transfusion and was referred to [facility] and underwent double-balloon enteroscopy on 05/19/2021 where she had 16 angioectasias in the duodenum, 6 angiectasia's in the proximal jejunum, and 5 angiectasias mid to distal jejunum all of which were ablated with APC.   Last seen in the office 05/30/2022.  At this visit she had been doing well for the most part.  She had a hemoglobin of 10.4 on 05/29/2022 although she did receive 1 unit PRBC in April.  She has been maintained on Eliquis for A-fib.  She noted chronic reflux which was controlled on pantoprazole 4 mg twice daily.  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  She denied any melena or rectal bleeding at that time.  She was advised to continue subcutaneous octreotide, Amitiza 24 mcg twice daily, and pantoprazole 40 mg twice daily with consideration to wean that to once daily  as tolerated.  Patient was recently hospitalized on 06/21/22-06/23/22.  She presented with weakness and worsening dyspnea on exertion and was found to have a hemoglobin of 7.8.  Iron panel with iron 21, TIBC 441, saturation 5%, and ferritin 12.  She was given 1 unit of PRBC.  She reportedly had been following with hematology and was going to discuss IV iron infusions.  She regularly has her hemoglobin checked every 2 weeks.  She was seen in consult for her anemia and history of small bowel AVMs by our team.  She mention intermittent blood in her stools due to hemorrhoids and last noted melena about a month prior to presentation.  Given recurrent anemia and possible bleeding the decision was made to refer her back to [facility] for double-balloon enteroscopy evaluation/intervention.  Patient agreed to this and was treated conservatively with blood transfusion/iron infusion.  She was advised to continue octreotide injections.  She is seeing hematology on 07/02/2022.  She reportedly was still symptomatic with fatigue, ice pica, lightheadedness, palpitations, and some dyspnea on exertion.  She failed to improve with oral iron supplementation therefore it was discontinued and the plan was for IV Venofer 400 mg for 2 doses and Retacrit 10,000 units every 2 weeks starting on 07/12/2022.  Other lab work was ordered including copper, folate, homocystine, B12, reticulocytes, methylmalonic acid, LDH, immunofixation, free light chains, and SPEP.  She was to repeat CBC and iron panel and follow-up in 2 months.  Labs 07/02/2022: Hemoglobin 9.7, MCV 92.2, folate 18.9, LDH 140, reticulocyte percentage  3.7, absolute retake count 132.8, immature retake fractionation 29.9%, copper normal at 107, methylmalonic acid normal at 255, B12 505.  Today: Has appt 9/6 with [facility] for the consultation.   Has not had any melena or hematochezia. Energy level is better. Has had 2 iron infusions. Did have some abdominal pain on Sunday when she was  singing, felt like a gripe, lasted only a minute then got better. Thinks it may have been from contractions of her abdominal muscles as she hasn't been to church to sing in a long time. Has not happened since. No nausea, vomiting, reflux, dysphagia.  Denies chest pain. No worsening shortness of breath, has some at baseline. Is due to see hematology September 19. Still having hemoglobin checked every 2 weeks. Appetite has improved. Not as much ice pica but still has some.  Does have some intermittent perineal discomfort/pain but no toilet tissue hematochezia or itching.  Reports she is having to use the Anusol cream little bit more frequently.  Reports she does not feel hemorrhoids when she has her hand down there but does not use regular toilet tissue.  She frequently uses wet wipes to clean herself.  Still doing the octreotide twice daily. Hgb 10.2 today prior to her receiving her Retacrit injection.    Current Outpatient Medications  Medication Sig Dispense Refill   acetaminophen (TYLENOL) 500 MG tablet Take 1,000 mg by mouth every 6 (six) hours as needed for moderate pain.     albuterol (VENTOLIN HFA) 108 (90 Base) MCG/ACT inhaler Inhale 2 puffs into the lungs every 6 (six) hours as needed for wheezing or shortness of breath. 18 g 1   calcitRIOL (ROCALTROL) 0.25 MCG capsule Take 0.25 mcg by mouth 3 (three) times a week. Monday,Wednesday and Fridays     Cholecalciferol (VITAMIN D-3) 25 MCG (1000 UT) CAPS Take 1,000 Units by mouth daily.     cyclobenzaprine (FLEXERIL) 5 MG tablet Take 5 mg by mouth 3 (three) times daily as needed for muscle spasms.     diclofenac Sodium (VOLTAREN) 1 % GEL Apply 1 application topically 4 (four) times daily as needed (pain).     ELIQUIS 5 MG TABS tablet TAKE 1 TABLET BY MOUTH TWICE A DAY 180 tablet 1   flecainide (TAMBOCOR) 50 MG tablet TAKE 1 TABLET BY MOUTH TWICE A DAY 60 tablet 5   furosemide (LASIX) 20 MG tablet Take 1 tablet (20 mg total) by mouth See admin  instructions. Take 40 mg in the morning and 20 mg every evening (Patient taking differently: Take 20 mg by mouth See admin instructions. Take 20 mg in the morning) 90 tablet 3   hydrocortisone (ANUSOL-HC) 2.5 % rectal cream Place 1 application rectally 2 (two) times daily. 30 g 1   losartan (COZAAR) 50 MG tablet Take 1 tablet (50 mg total) by mouth daily. 90 tablet 3   lubiprostone (AMITIZA) 24 MCG capsule Take 1 capsule (24 mcg total) by mouth in the morning and at bedtime. 180 capsule 3   octreotide (SANDOSTATIN) 100 MCG/ML SOLN injection Inject 1 mL (100 mcg total) into the skin every 12 (twelve) hours. 60 mL 11   ondansetron (ZOFRAN) 4 MG tablet Take 1 tablet (4 mg total) by mouth every 6 (six) hours as needed for nausea. 20 tablet 0   pantoprazole (PROTONIX) 40 MG tablet Take 1 tablet (40 mg total) by mouth daily. 90 tablet 3   polyethylene glycol (MIRALAX / GLYCOLAX) 17 g packet Take 17 g by mouth daily as  needed for mild constipation. 14 each 0   potassium chloride SA (KLOR-CON) 20 MEQ tablet Take 20 mEq by mouth in the morning, at noon, and at bedtime.     rosuvastatin (CRESTOR) 10 MG tablet TAKE 1 TABLET BY MOUTH EVERY DAY 30 tablet 0   vitamin C (ASCORBIC ACID) 250 MG tablet Take 250 mg by mouth daily.     No current facility-administered medications for this visit.    Past Medical History:  Diagnosis Date   Anemia    Chronic kidney disease    Constipation 07/17/2017   GERD (gastroesophageal reflux disease)    Gout    Grade II diastolic dysfunction 14/03/8184   Hyperlipidemia    Hypertension    Hypokalemia    OSA (obstructive sleep apnea) 08/09/2020   PAF (paroxysmal atrial fibrillation) (HCC)    PAF (paroxysmal atrial fibrillation) (HCC)    PUD (peptic ulcer disease)    Syncope    Neurocardiogenic    Past Surgical History:  Procedure Laterality Date   ABDOMINAL AORTOGRAM W/LOWER EXTREMITY Right 12/13/2020   Procedure: ABDOMINAL AORTOGRAM W/LOWER EXTREMITY;  Surgeon:  Serafina Mitchell, MD;  Location: [place] [facility];  Service: Cardiovascular;  Laterality: Right;   ABDOMINAL HYSTERECTOMY     AMPUTATION Right 12/14/2020   Procedure: Right fifth toe amputation;  Surgeon: Rosetta Posner, MD;  [facility];  Service: Vascular;  Laterality: Right;   CATARACT EXTRACTION Bilateral    COLONOSCOPY N/A 10/07/2014   Dr. Oneida Alar: redundant left colon, moderate sized external hemorrhoids    COLONOSCOPY N/A 03/11/2020   Surgeon: Danie Binder, MD;  9 polyps, majority of which were tubular adenomas, nodular mucosa in the anus s/p biopsy which was benign.   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   ESOPHAGOGASTRODUODENOSCOPY (EGD) WITH PROPOFOL N/A 11/25/2020    Surgeon: Daneil Dolin, MD; Normal, s/p capsule deployment   [place] N/A 11/25/2020    Surgeon: Daneil Dolin, MD; 4/nonbleeding AVMs in the proximal small bowel large fresh blood, 2 bleeding lymphangiectasis in SB.    HOT HEMOSTASIS  11/27/2020   Procedure: HOT HEMOSTASIS (ARGON PLASMA COAGULATION/BICAP);  Surgeon: Montez Morita, Quillian Quince, MD;  [facility];  Service: Gastroenterology;;   LIPOMA RESECTION     PERIPHERAL VASCULAR INTERVENTION Right 12/13/2020   Procedure: PERIPHERAL VASCULAR INTERVENTION;  Surgeon: Serafina Mitchell, MD;  Location: [place] [facility];  Service: Cardiovascular;  Laterality: Right;  SFA/PT/AT   POLYPECTOMY  03/11/2020   Procedure: POLYPECTOMY;  Surgeon: Danie Binder, MD;  Location: [facility];  Service: Endoscopy;;  cecal, transverse, descending, sigmoid   TEE WITHOUT CARDIOVERSION N/A 05/10/2021   Procedure: TRANSESOPHAGEAL ECHOCARDIOGRAM (TEE) WATCHMEN EVALUATION;  Surgeon: Josue Hector, MD;  Location: [facility] END[facility]COPY;  Service: Cardiovascular;  Laterality: N/A;    Family History  Problem Relation  Age of Onset   Stroke Mother    Dementia Father    Cancer - Other Sister    Atrial fibrillation Sister    Heart failure Sister    Colon cancer Maternal Grandmother        diagnosed in her 29s    Allergies as of 07/30/2022 - Review Complete 07/30/2022  Allergen Reaction Noted   Amlodipine  08/03/2020   Amoxicillin Hives 03/04/2020   Doxycycline  11/23/2020   Acetaminophen-codeine Hives and Rash 09/16/2020  Allopurinol Hives and Rash    Tramadol Hives and Rash     Social History   Socioeconomic History   Marital status: Single    Spouse name: Not on file   Number of children: Not on file   Years of education: Not on file   Highest education level: Not on file  Occupational History   Occupation: Retired  Tobacco Use   Smoking status: Former    Packs/day: 1.00    Years: 20.00    Total pack years: 20.00    Types: Cigarettes    Start date: 03/16/1964    Quit date: 10/07/1997    Years since quitting: 24.8   Smokeless tobacco: Never  Vaping Use   Vaping Use: Never used  Substance and Sexual Activity   Alcohol use: No    Alcohol/week: 0.0 standard drinks of alcohol   Drug use: No   Sexual activity: Yes    Partners: Male  Other Topics Concern   Not on file  Social History Narrative   Not on file   Social Determinants of Health   Financial Resource Strain: Not on file  Food Insecurity: Not on file  Transportation Needs: Not on file  Physical Activity: Not on file  Stress: Not on file  Social Connections: Not on file     Review of Systems   Gen: Denies fever, chills, anorexia. Denies fatigue, weakness, weight loss.  CV: Denies chest pain, palpitations, syncope, peripheral edema, and claudication. Resp: Denies dyspnea at rest, cough, wheezing, coughing up blood, and pleurisy. GI: See HPI Derm: Denies rash, itching, dry skin Psych: Denies depression, anxiety, memory loss, confusion. No homicidal or suicidal ideation.  Heme: Denies bruising, bleeding, and  enlarged lymph nodes.   Physical Exam   BP (!) 190/69 (BP Location: Left Arm, Patient Position: Sitting, Cuff Size: Normal)   Temp 97.8 F (36.6 C) (Temporal)   Ht '5\' 2"'$  (1.575 m)   Wt 175 lb 6.4 oz (79.6 kg)   BMI 32.08 kg/m   General:   Alert and oriented. No distress noted. Pleasant and cooperative.  Head:  Normocephalic and atraumatic. Eyes:  Conjuctiva clear without scleral icterus. Lungs:  Clear to auscultation bilaterally. No wheezes, rales, or rhonchi. No distress.  Heart:  S1, S2 present without murmurs appreciated.  Abdomen:  +BS, soft, non-tender and non-distended. No rebound or guarding. No HSM or masses noted. Rectal: deferred Msk:  Symmetrical without gross deformities. Normal posture. Extremities:  Without edema. Neurologic:  Alert and  oriented x4 Psych:  Alert and cooperative. Normal mood and affect.   Assessment  [name] [name] is a 76 y.o. 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   Chronic Anemia/history of small bowel AVMs: Likely multifactorial in the setting of chronic kidney disease as well as small bowel AVMs.  She has failed oral iron therapy and has been following with hematology and is receiving IV iron infusions.  She also had Retacrit infusion today.  She has her hemoglobin monitored every 2 weeks with hematology and she has had improvement of her hemoglobin up to 10.2 today.  She denies any melena or hematochezia.  She reports her energy level has been increased as her blood counts have increased.  She denies any chest pain, or worsening shortness of breath.  She continues to have some intermittent ice pica.  She denies any abdominal pain.  She remains on  Eliquis for A-fib but overall has had improvement in her symptoms and no recurrence of bleeding.  She has referral for [facility] to discuss possible double-balloon enteroscopy given her history of small  bowel AVMs that is scheduled for 08/29/2022.  She is to continue octreotide twice daily.  IBS - Constipation: Everyday soft bowel movements on Amitiza 24 mcg twice daily. Has been needing the hemorrhoid cream more often due to pain. Using it about a few times a week. Has to use wipes to clean herself. Has perineal irritation/pain without itching.  Not hemorrhoid banding candidate given that she is on Eliquis.  Suspect possible perineal/rectal irritation given frequent moisture with wet wipes, advised patient to make sure she dries herself adequately after using the wipes and/or applying Desitin or other zinc-based cream to her perineum as needed.  PLAN   Amitiza 24 mcg twice daily Continue octreotide twice daily Continue anusol as needed. Desitin or other zinc-based cream to perineum as needed. Avoid NSAID containing products. Continue to follow-up with hematology Continue follow-up with [facility] to discuss potential double-balloon enteroscopy.    Venetia Night, MSN, FNP-BC, AGACNP-BC [facility]

## 2022-07-30 ENCOUNTER — Encounter: Payer: Self-pay | Admitting: Gastroenterology

## 2022-07-30 ENCOUNTER — Ambulatory Visit (INDEPENDENT_AMBULATORY_CARE_PROVIDER_SITE_OTHER): Payer: Medicare HMO | Admitting: Gastroenterology

## 2022-07-30 ENCOUNTER — Inpatient Hospital Stay: Payer: Medicare HMO

## 2022-07-30 ENCOUNTER — Inpatient Hospital Stay: Payer: Medicare HMO | Attending: Hematology

## 2022-07-30 VITALS — BP 149/56 | HR 50 | Temp 97.0°F | Resp 18

## 2022-07-30 VITALS — BP 190/69 | Temp 97.8°F | Ht 62.0 in | Wt 175.4 lb

## 2022-07-30 DIAGNOSIS — D509 Iron deficiency anemia, unspecified: Secondary | ICD-10-CM

## 2022-07-30 DIAGNOSIS — K5521 Angiodysplasia of colon with hemorrhage: Secondary | ICD-10-CM | POA: Diagnosis not present

## 2022-07-30 DIAGNOSIS — D631 Anemia in chronic kidney disease: Secondary | ICD-10-CM | POA: Insufficient documentation

## 2022-07-30 DIAGNOSIS — I129 Hypertensive chronic kidney disease with stage 1 through stage 4 chronic kidney disease, or unspecified chronic kidney disease: Secondary | ICD-10-CM | POA: Insufficient documentation

## 2022-07-30 DIAGNOSIS — D500 Iron deficiency anemia secondary to blood loss (chronic): Secondary | ICD-10-CM

## 2022-07-30 DIAGNOSIS — Z79899 Other long term (current) drug therapy: Secondary | ICD-10-CM | POA: Insufficient documentation

## 2022-07-30 DIAGNOSIS — N184 Chronic kidney disease, stage 4 (severe): Secondary | ICD-10-CM | POA: Insufficient documentation

## 2022-07-30 DIAGNOSIS — K581 Irritable bowel syndrome with constipation: Secondary | ICD-10-CM

## 2022-07-30 LAB — CBC
HCT: 34.2 % — ABNORMAL LOW (ref 36.0–46.0)
Hemoglobin: 10.2 g/dL — ABNORMAL LOW (ref 12.0–15.0)
MCH: 27.9 pg (ref 26.0–34.0)
MCHC: 29.8 g/dL — ABNORMAL LOW (ref 30.0–36.0)
MCV: 93.7 fL (ref 80.0–100.0)
Platelets: 292 10*3/uL (ref 150–400)
RBC: 3.65 MIL/uL — ABNORMAL LOW (ref 3.87–5.11)
RDW: 16.8 % — ABNORMAL HIGH (ref 11.5–15.5)
WBC: 4.6 10*3/uL (ref 4.0–10.5)
nRBC: 0 % (ref 0.0–0.2)

## 2022-07-30 MED ORDER — EPOETIN ALFA-EPBX 10000 UNIT/ML IJ SOLN
10000.0000 [IU] | Freq: Once | INTRAMUSCULAR | Status: AC
  Administered 2022-07-30: 10000 [IU] via SUBCUTANEOUS
  Filled 2022-07-30: qty 1

## 2022-07-30 NOTE — Progress Notes (Signed)
10.2 HGB. [name] [name] presents today for injection per the provider's orders.  Retacrit 10,000 administration without incident; injection site WNL; see MAR for injection details.  Patient tolerated procedure well and without incident.  No questions or complaints noted at this time. Discharged from clinic by wheel chair in stable condition. Alert and oriented x 3. F/U with [facility] as scheduled.

## 2022-07-30 NOTE — Patient Instructions (Signed)
[place]  Discharge Instructions: Thank you for choosing [facility] to provide your oncology and hematology care.  If you have a lab appointment with the [facility], please come in thru the Main Entrance and check in at the main information desk.  Wear comfortable clothing and clothing appropriate for easy access to any Portacath or PICC line.   We strive to give you quality time with your provider. You may need to reschedule your appointment if you arrive late (15 or more minutes).  Arriving late affects you and other patients whose appointments are after yours.  Also, if you miss three or more appointments without notifying the office, you may be dismissed from the clinic at the provider's discretion.      For prescription refill requests, have your pharmacy contact our office and allow 72 hours for refills to be completed.    Today you received the following chemotherapy and/or immunotherapy agents Retacrit injection.  Epoetin Alfa Injection What is this medication? EPOETIN ALFA (e POE e tin AL fa) treats low levels of red blood cells (anemia) caused by kidney disease, chemotherapy, or HIV medications. It can also be used in people who are at risk for blood loss during surgery. It works by Building control surveyor make more red blood cells, which reduces the need for blood transfusions. This medicine may be used for other purposes; ask your health care provider or pharmacist if you have questions. COMMON BRAND NAME(S): Epogen, Procrit, Retacrit What should I tell my care team before I take this medication? They need to know if you have any of these conditions: Blood clots Cancer Heart disease High blood pressure On dialysis Seizures Stroke An unusual or allergic reaction to epoetin alfa, albumin, benzyl alcohol, other medications, foods, dyes, or preservatives Pregnant or trying to get pregnant Breast-feeding How should I use this medication? This  medication is injected into a vein or under the skin. It is usually given by your care team in a hospital or clinic setting. It may also be given at home. If you get this medication at home, you will be taught how to prepare and give it. Use exactly as directed. Take it as directed on the prescription label at the same time every day. Keep taking it unless your care team tells you to stop. It is important that you put your used needles and syringes in a special sharps container. Do not put them in a trash can. If you do not have a sharps container, call your pharmacist or care team to get one. A special MedGuide will be given to you by the pharmacist with each prescription and refill. Be sure to read this information carefully each time. Talk to your care team about the use of this medication in children. While this medication may be used in children as young as 64 month of age for selected conditions, precautions do apply. Overdosage: If you think you have taken too much of this medicine contact a poison control center or emergency room at once. NOTE: This medicine is only for you. Do not share this medicine with others. What if I miss a dose? If you miss a dose, take it as soon as you can. If it is almost time for your next dose, take only that dose. Do not take double or extra doses. What may interact with this medication? Darbepoetin alfa Methoxy polyethylene glycol-epoetin beta This list may not describe all possible interactions. Give your health care  provider a list of all the medicines, herbs, non-prescription drugs, or dietary supplements you use. Also tell them if you smoke, drink alcohol, or use illegal drugs. Some items may interact with your medicine. What should I watch for while using this medication? Visit your care team for regular checks on your progress. Check your blood pressure as directed. Know what your blood pressure should be and when to contact your care team. Your condition  will be monitored carefully while you are receiving this medication. You may need blood work while taking this medication. What side effects may I notice from receiving this medication? Side effects that you should report to your care team as soon as possible: Allergic reactions--skin rash, itching, hives, swelling of the face, lips, tongue, or throat Blood clot--pain, swelling, or warmth in the leg, shortness of breath, chest pain Heart attack--pain or tightness in the chest, shoulders, arms, or jaw, nausea, shortness of breath, cold or clammy skin, feeling faint or lightheaded Increase in blood pressure Rash, fever, and swollen lymph nodes Redness, blistering, peeling, or loosening of the skin, including inside the mouth Seizures Stroke--sudden numbness or weakness of the face, arm, or leg, trouble speaking, confusion, trouble walking, loss of balance or coordination, dizziness, severe headache, change in vision Side effects that usually do not require medical attention (report to your care team if they continue or are bothersome): Bone, joint, or muscle pain Cough Headache Nausea Pain, redness, or irritation at injection site This list may not describe all possible side effects. Call your doctor for medical advice about side effects. You may report side effects to FDA at 1-800-FDA-1088. Where should I keep my medication? Keep out of the reach of children and pets. Store in a refrigerator. Do not freeze. Do not shake. Protect from light. Keep this medication in the original container until you are ready to take it. See product for storage information. Get rid of any unused medication after the expiration date. To get rid of medications that are no longer needed or have expired: Take the medication to a medication take-back program. Check with your pharmacy or law enforcement to find a location. If you cannot return the medication, ask your pharmacist or care team how to get rid of the  medication safely. NOTE: This sheet is a summary. It may not cover all possible information. If you have questions about this medicine, talk to your doctor, pharmacist, or health care provider.  2023 Elsevier/Gold Standard (2022-03-22 00:00:00)       To help prevent nausea and vomiting after your treatment, we encourage you to take your nausea medication as directed.  BELOW ARE SYMPTOMS THAT SHOULD BE REPORTED IMMEDIATELY: *FEVER GREATER THAN 100.4 F (38 C) OR HIGHER *CHILLS OR SWEATING *NAUSEA AND VOMITING THAT IS NOT CONTROLLED WITH YOUR NAUSEA MEDICATION *UNUSUAL SHORTNESS OF BREATH *UNUSUAL BRUISING OR BLEEDING *URINARY PROBLEMS (pain or burning when urinating, or frequent urination) *BOWEL PROBLEMS (unusual diarrhea, constipation, pain near the anus) TENDERNESS IN MOUTH AND THROAT WITH OR WITHOUT PRESENCE OF ULCERS (sore throat, sores in mouth, or a toothache) UNUSUAL RASH, SWELLING OR PAIN  UNUSUAL VAGINAL DISCHARGE OR ITCHING   Items with * indicate a potential emergency and should be followed up as soon as possible or go to the Emergency Department if any problems should occur.  Please show the CHEMOTHERAPY ALERT CARD or IMMUNOTHERAPY ALERT CARD at check-in to the Emergency Department and triage nurse.  Should you have questions after your visit or need to cancel or  reschedule your appointment, please contact [facility] [phone]  and follow the prompts.  Office hours are 8:00 a.m. to 4:30 p.m. Monday - Friday. Please note that voicemails left after 4:00 p.m. may not be returned until the following business day.  We are closed weekends and major holidays. You have access to a nurse at all times for urgent questions. Please call the main number to the clinic [phone] and follow the prompts.  For any non-urgent questions, you may also contact your provider using MyChart. We now offer e-Visits for anyone 63 and older to request care online for non-urgent  symptoms. For details visit [email or web address].   Also download the MyChart app! Go to the app store, search "MyChart", open the app, select [facility], and log in with your MyChart username and password.  Masks are optional in the cancer centers. If you would like for your care team to wear a mask while they are taking care of you, please let them know. For doctor visits, patients may have with them one support person who is at least 76 years old. At this time, visitors are not allowed in the infusion area.

## 2022-07-30 NOTE — Patient Instructions (Addendum)
It was a pleasure seeing you again today!  I am glad that you are feeling better.  Continue to follow with hematology and complete your blood work every 2 weeks.  Continue your follow-up with [facility] to discuss potential double-balloon enteroscopy.  Continue to monitor yourself for any bright red blood or dark/melanotic stools.  Continue to avoid all NSAID containing products.  Continue Amitiza 24 mcg twice daily as well as your octreotide twice daily.  Also continue your Anusol rectal cream as needed.  As we discussed if you are having perineal irritation/discomfort you could use some over-the-counter Desitin.  Says use wipes to get yourself clean with bowel movements, the area may not be getting adequately dry and holding on to moisture which may be causing you the discomfort. Do not hesitate to reach out to [place] for any issues in the future.  We will have you follow-up in 6 months.  It was a pleasure to see you today. I want to create trusting relationships with patients. If you receive a survey regarding your visit,  I greatly appreciate you taking time to fill this out on paper or through your MyChart. I value your feedback.  Venetia Night, MSN, FNP-BC, AGACNP-BC [facility]

## 2022-08-13 ENCOUNTER — Inpatient Hospital Stay (HOSPITAL_COMMUNITY): Payer: Medicare HMO | Attending: Hematology

## 2022-08-13 ENCOUNTER — Inpatient Hospital Stay: Payer: Medicare HMO

## 2022-08-13 VITALS — BP 161/72 | HR 57 | Temp 97.7°F | Resp 18

## 2022-08-13 DIAGNOSIS — I5032 Chronic diastolic (congestive) heart failure: Secondary | ICD-10-CM | POA: Insufficient documentation

## 2022-08-13 DIAGNOSIS — D500 Iron deficiency anemia secondary to blood loss (chronic): Secondary | ICD-10-CM

## 2022-08-13 DIAGNOSIS — I130 Hypertensive heart and chronic kidney disease with heart failure and stage 1 through stage 4 chronic kidney disease, or unspecified chronic kidney disease: Secondary | ICD-10-CM | POA: Diagnosis present

## 2022-08-13 DIAGNOSIS — D631 Anemia in chronic kidney disease: Secondary | ICD-10-CM | POA: Diagnosis not present

## 2022-08-13 DIAGNOSIS — N184 Chronic kidney disease, stage 4 (severe): Secondary | ICD-10-CM | POA: Diagnosis not present

## 2022-08-13 LAB — CBC
HCT: 34.9 % — ABNORMAL LOW (ref 36.0–46.0)
Hemoglobin: 10.6 g/dL — ABNORMAL LOW (ref 12.0–15.0)
MCH: 28.2 pg (ref 26.0–34.0)
MCHC: 30.4 g/dL (ref 30.0–36.0)
MCV: 92.8 fL (ref 80.0–100.0)
Platelets: 263 10*3/uL (ref 150–400)
RBC: 3.76 MIL/uL — ABNORMAL LOW (ref 3.87–5.11)
RDW: 15.6 % — ABNORMAL HIGH (ref 11.5–15.5)
WBC: 5.6 10*3/uL (ref 4.0–10.5)
nRBC: 0 % (ref 0.0–0.2)

## 2022-08-13 MED ORDER — EPOETIN ALFA-EPBX 10000 UNIT/ML IJ SOLN
10000.0000 [IU] | Freq: Once | INTRAMUSCULAR | Status: AC
  Administered 2022-08-13: 10000 [IU] via SUBCUTANEOUS
  Filled 2022-08-13: qty 1

## 2022-08-13 NOTE — Progress Notes (Signed)
[name] [name] presents today for injection per the provider's orders.  Retacrit 10,000 units  administration without incident; injection site WNL; see MAR for injection details.  Patient tolerated procedure well and without incident.  No questions or complaints noted at this time.

## 2022-08-13 NOTE — Progress Notes (Signed)
Patient presents today for Retacrit injection.  Patient is in satisfactory condition with no complaints voiced. Vital signs are stable.  Hemoglobin today is 10.6.  We will proceed with injection per MD orders.   Patient tolerated injection with no complaints voiced.  Site clean and dry with no bruising or swelling noted.  No complaints of pain.  Discharged with vital signs stable and no signs or symptoms of distress noted.

## 2022-08-13 NOTE — Patient Instructions (Signed)
[facility]  Discharge Instructions: Thank you for choosing [facility] to provide your oncology and hematology care.  If you have a lab appointment with the [facility], please come in thru the Main Entrance and check in at the main information desk.  Wear comfortable clothing and clothing appropriate for easy access to any Portacath or PICC line.   We strive to give you quality time with your provider. You may need to reschedule your appointment if you arrive late (15 or more minutes).  Arriving late affects you and other patients whose appointments are after yours.  Also, if you miss three or more appointments without notifying the office, you may be dismissed from the clinic at the provider's discretion.      For prescription refill requests, have your pharmacy contact our office and allow 72 hours for refills to be completed.    Today you received the following chemotherapy and/or immunotherapy agents Retacrit injection.        To help prevent nausea and vomiting after your treatment, we encourage you to take your nausea medication as directed.  BELOW ARE SYMPTOMS THAT SHOULD BE REPORTED IMMEDIATELY: *FEVER GREATER THAN 100.4 F (38 C) OR HIGHER *CHILLS OR SWEATING *NAUSEA AND VOMITING THAT IS NOT CONTROLLED WITH YOUR NAUSEA MEDICATION *UNUSUAL SHORTNESS OF BREATH *UNUSUAL BRUISING OR BLEEDING *URINARY PROBLEMS (pain or burning when urinating, or frequent urination) *BOWEL PROBLEMS (unusual diarrhea, constipation, pain near the anus) TENDERNESS IN MOUTH AND THROAT WITH OR WITHOUT PRESENCE OF ULCERS (sore throat, sores in mouth, or a toothache) UNUSUAL RASH, SWELLING OR PAIN  UNUSUAL VAGINAL DISCHARGE OR ITCHING   Items with * indicate a potential emergency and should be followed up as soon as possible or go to the Emergency Department if any problems should occur.  Please show the CHEMOTHERAPY ALERT CARD or IMMUNOTHERAPY ALERT CARD at check-in  to the Emergency Department and triage nurse.  Should you have questions after your visit or need to cancel or reschedule your appointment, please contact [facility] [phone]  and follow the prompts.  Office hours are 8:00 a.m. to 4:30 p.m. Monday - Friday. Please note that voicemails left after 4:00 p.m. may not be returned until the following business day.  We are closed weekends and major holidays. You have access to a nurse at all times for urgent questions. Please call the main number to the clinic [phone] and follow the prompts.  For any non-urgent questions, you may also contact your provider using MyChart. We now offer e-Visits for anyone 58 and older to request care online for non-urgent symptoms. For details visit [email or web address].   Also download the MyChart app! Go to the app store, search "MyChart", open the app, select [facility], and log in with your MyChart username and password.  Masks are optional in the cancer centers. If you would like for your care team to wear a mask while they are taking care of you, please let them know. You may have one support person who is at least 76 years old accompany you for your appointments.

## 2022-08-15 ENCOUNTER — Other Ambulatory Visit (HOSPITAL_COMMUNITY)
Admission: RE | Admit: 2022-08-15 | Discharge: 2022-08-15 | Disposition: A | Discharge status: Home / Self Care | Payer: Medicare HMO | Source: Ambulatory Visit | Attending: Nephrology | Admitting: Nephrology

## 2022-08-15 DIAGNOSIS — N184 Chronic kidney disease, stage 4 (severe): Secondary | ICD-10-CM | POA: Insufficient documentation

## 2022-08-15 LAB — RENAL FUNCTION PANEL
Albumin: 3.9 g/dL (ref 3.5–5.0)
Anion gap: 6 (ref 5–15)
BUN: 23 mg/dL (ref 8–23)
CO2: 28 mmol/L (ref 22–32)
Calcium: 8.9 mg/dL (ref 8.9–10.3)
Chloride: 104 mmol/L (ref 98–111)
Creatinine, Ser: 1.84 mg/dL — ABNORMAL HIGH (ref 0.44–1.00)
GFR, Estimated: 28 mL/min — ABNORMAL LOW (ref >60)
Glucose, Bld: 122 mg/dL — ABNORMAL HIGH (ref 70–99)
Phosphorus: 3.2 mg/dL (ref 2.5–4.6)
Potassium: 4.6 mmol/L (ref 3.5–5.1)
Sodium: 138 mmol/L (ref 135–145)

## 2022-08-28 ENCOUNTER — Inpatient Hospital Stay: Payer: Medicare HMO

## 2022-08-28 ENCOUNTER — Inpatient Hospital Stay (HOSPITAL_COMMUNITY): Payer: Medicare HMO | Attending: Hematology

## 2022-08-28 DIAGNOSIS — Z7901 Long term (current) use of anticoagulants: Secondary | ICD-10-CM | POA: Diagnosis not present

## 2022-08-28 DIAGNOSIS — I130 Hypertensive heart and chronic kidney disease with heart failure and stage 1 through stage 4 chronic kidney disease, or unspecified chronic kidney disease: Secondary | ICD-10-CM | POA: Diagnosis not present

## 2022-08-28 DIAGNOSIS — I480 Paroxysmal atrial fibrillation: Secondary | ICD-10-CM | POA: Diagnosis not present

## 2022-08-28 DIAGNOSIS — N184 Chronic kidney disease, stage 4 (severe): Secondary | ICD-10-CM | POA: Diagnosis not present

## 2022-08-28 DIAGNOSIS — D631 Anemia in chronic kidney disease: Secondary | ICD-10-CM

## 2022-08-28 DIAGNOSIS — D500 Iron deficiency anemia secondary to blood loss (chronic): Secondary | ICD-10-CM | POA: Diagnosis not present

## 2022-08-28 DIAGNOSIS — K922 Gastrointestinal hemorrhage, unspecified: Secondary | ICD-10-CM | POA: Diagnosis not present

## 2022-08-28 DIAGNOSIS — I5032 Chronic diastolic (congestive) heart failure: Secondary | ICD-10-CM | POA: Insufficient documentation

## 2022-08-28 DIAGNOSIS — Z87891 Personal history of nicotine dependence: Secondary | ICD-10-CM | POA: Insufficient documentation

## 2022-08-28 LAB — CBC
HCT: 37.1 % (ref 36.0–46.0)
Hemoglobin: 11.2 g/dL — ABNORMAL LOW (ref 12.0–15.0)
MCH: 27.9 pg (ref 26.0–34.0)
MCHC: 30.2 g/dL (ref 30.0–36.0)
MCV: 92.5 fL (ref 80.0–100.0)
Platelets: 306 10*3/uL (ref 150–400)
RBC: 4.01 MIL/uL (ref 3.87–5.11)
RDW: 15.2 % (ref 11.5–15.5)
WBC: 4.8 10*3/uL (ref 4.0–10.5)
nRBC: 0 % (ref 0.0–0.2)

## 2022-08-28 NOTE — Progress Notes (Signed)
Patient presents today for Retacrit injection today. Patient's Hemoglobin 11.2 no injection needed today per parameters. Patient given copy of lab work and discharged.

## 2022-09-10 NOTE — Progress Notes (Unsigned)
[name] [name], [name] [name]   CLINIC:  Medical Oncology/Hematology  PCP:  [name] Shad, MD [address] [place] [address] [phone]   REASON FOR VISIT:  Follow-up for normocytic anemia  PRIOR THERAPY: ***  CURRENT THERAPY: ***  INTERVAL HISTORY:  [name] [name] 76 y.o. 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  She was seen for initial consultation by Dr. Delton [name] and [name] Abernethy PA-C on 07/02/2022.  At today's visit, she reports feeling ***.   *** Symptoms after IV iron *** *** Bleeding *** Fatigue *** Pica, RLS, headaches *** CP, DOE, LH, syncope   She has ***% energy and ***% appetite. She endorses that she is maintaining a stable weight.    REVIEW OF SYSTEMS: *** Review of Systems - Oncology    PAST MEDICAL/SURGICAL HISTORY:  Past Medical History:  Diagnosis Date   Anemia    Chronic kidney disease    Constipation 07/17/2017   GERD (gastroesophageal reflux disease)    Gout    Grade II diastolic dysfunction 63/0/1601   Hyperlipidemia    Hypertension    Hypokalemia    OSA (obstructive sleep apnea) 08/09/2020   PAF (paroxysmal atrial fibrillation) (HCC)    PAF (paroxysmal atrial fibrillation) (HCC)    PUD (peptic ulcer disease)    Syncope    Neurocardiogenic   Past Surgical History:  Procedure Laterality Date   ABDOMINAL AORTOGRAM W/LOWER EXTREMITY Right 12/13/2020   Procedure: ABDOMINAL AORTOGRAM W/LOWER EXTREMITY;  Surgeon: [name] Mitchell, MD;  Location: [facility];  Service: Cardiovascular;  Laterality: Right;   ABDOMINAL HYSTERECTOMY     AMPUTATION Right 12/14/2020   Procedure: Right fifth toe amputation;  Surgeon: [name] Posner, MD;  Location: [facility] OR;  Service: Vascular;  Laterality: Right;   CATARACT EXTRACTION Bilateral    COLONOSCOPY N/A 10/07/2014   Dr. Oneida [name]: redundant left colon,  moderate sized external hemorrhoids    COLONOSCOPY N/A 03/11/2020   Surgeon: [name] Binder, MD;  9 polyps, majority of which were tubular adenomas, nodular mucosa in the anus s/p biopsy which was benign.   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   ESOPHAGOGASTRODUODENOSCOPY (EGD) WITH PROPOFOL N/A 11/25/2020    Surgeon: [name] Dolin, MD; Normal, s/p capsule deployment   [place] N/A 11/25/2020    Surgeon: [name] Dolin, MD; 4/nonbleeding AVMs in the proximal small bowel large fresh blood, 2 bleeding lymphangiectasis in SB.    HOT HEMOSTASIS  11/27/2020   Procedure: HOT HEMOSTASIS (ARGON PLASMA COAGULATION/BICAP);  Surgeon: [name] [name], [name] Quince, MD;  [facility];  Service: Gastroenterology;;   LIPOMA RESECTION     PERIPHERAL VASCULAR INTERVENTION Right 12/13/2020   Procedure: PERIPHERAL VASCULAR INTERVENTION;  Surgeon: [name] Mitchell, MD;  Location: [facility];  Service: Cardiovascular;  Laterality: Right;  SFA/PT/AT   POLYPECTOMY  03/11/2020   Procedure: POLYPECTOMY;  Surgeon: [name] Binder, MD;  [facility]: [facility];  Service: Endoscopy;;  cecal, transverse, descending, sigmoid   TEE WITHOUT CARDIOVERSION N/A 05/10/2021   Procedure: TRANSESOPHAGEAL ECHOCARDIOGRAM (TEE) WATCHMEN EVALUATION;  Surgeon: [name] Hector, MD;  Location: [facility] ENDOSCOPY;  Service: Cardiovascular;  Laterality: N/A;     SOCIAL HISTORY:  Social History   Socioeconomic History  Marital status: Single    Spouse name: Not on file   Number of children: Not on file   Years of education: Not on file   Highest education level: Not on file  Occupational History   Occupation: Retired  Tobacco Use   Smoking status: Former    Packs/day: 1.00    Years: 20.00    Total pack years: 20.00    Types: Cigarettes    Start date: 03/16/1964     Quit date: 10/07/1997    Years since quitting: 24.9   Smokeless tobacco: Never  Vaping Use   Vaping Use: Never used  Substance and Sexual Activity   Alcohol use: No    Alcohol/week: 0.0 standard drinks of alcohol   Drug use: No   Sexual activity: Yes    Partners: Male  Other Topics Concern   Not on file  Social History Narrative   Not on file   Social Determinants of Health   Financial Resource Strain: Not on file  Food Insecurity: Not on file  Transportation Needs: Not on file  Physical Activity: Not on file  Stress: Not on file  Social Connections: Not on file  Intimate Partner Violence: Not on file    FAMILY HISTORY:  Family History  Problem Relation Age of Onset   Stroke Mother    Dementia Father    Cancer - Other Sister    Atrial fibrillation Sister    Heart failure Sister    Colon cancer Maternal Grandmother        diagnosed in her 78s    CURRENT MEDICATIONS:  Outpatient Encounter Medications as of 09/11/2022  Medication Sig Note   acetaminophen (TYLENOL) 500 MG tablet Take 1,000 mg by mouth every 6 (six) hours as needed for moderate pain.    albuterol (VENTOLIN HFA) 108 (90 Base) MCG/ACT inhaler Inhale 2 puffs into the lungs every 6 (six) hours as needed for wheezing or shortness of breath.    calcitRIOL (ROCALTROL) 0.25 MCG capsule Take 0.25 mcg by mouth 3 (three) times a week. Monday,Wednesday and Fridays    Cholecalciferol (VITAMIN D-3) 25 MCG (1000 UT) CAPS Take 1,000 Units by mouth daily.    cyclobenzaprine (FLEXERIL) 5 MG tablet Take 5 mg by mouth 3 (three) times daily as needed for muscle spasms.    diclofenac Sodium (VOLTAREN) 1 % GEL Apply 1 application topically 4 (four) times daily as needed (pain).    ELIQUIS 5 MG TABS tablet TAKE 1 TABLET BY MOUTH TWICE A DAY    flecainide (TAMBOCOR) 50 MG tablet TAKE 1 TABLET BY MOUTH TWICE A DAY    furosemide (LASIX) 20 MG tablet Take 1 tablet (20 mg total) by mouth See admin instructions. Take 40 mg in the  morning and 20 mg every evening (Patient taking differently: Take 20 mg by mouth See admin instructions. Take 20 mg in the morning)    hydrocortisone (ANUSOL-HC) 2.5 % rectal cream Place 1 application rectally 2 (two) times daily. 05/30/2022: As needed.   losartan (COZAAR) 50 MG tablet Take 1 tablet (50 mg total) by mouth daily.    lubiprostone (AMITIZA) 24 MCG capsule Take 1 capsule (24 mcg total) by mouth in the morning and at bedtime.    octreotide (SANDOSTATIN) 100 MCG/ML SOLN injection Inject 1 mL (100 mcg total) into the skin every 12 (twelve) hours.    ondansetron (ZOFRAN) 4 MG tablet Take 1 tablet (4 mg total) by mouth every 6 (six) hours as needed for nausea.  pantoprazole (PROTONIX) 40 MG tablet Take 1 tablet (40 mg total) by mouth daily.    polyethylene glycol (MIRALAX / GLYCOLAX) 17 g packet Take 17 g by mouth daily as needed for mild constipation.    potassium chloride SA (KLOR-CON) 20 MEQ tablet Take 20 mEq by mouth in the morning, at noon, and at bedtime.    rosuvastatin (CRESTOR) 10 MG tablet TAKE 1 TABLET BY MOUTH EVERY DAY    vitamin C (ASCORBIC ACID) 250 MG tablet Take 250 mg by mouth daily.    No facility-administered encounter medications on file as of 09/11/2022.    ALLERGIES:  Allergies  Allergen Reactions   Amlodipine     Felt she retained fluid in her chest    Amoxicillin Hives    Did it involve swelling of the face/tongue/throat, SOB, or low BP? No Did it involve sudden or severe rash/hives, skin peeling, or any reaction on the inside of your mouth or nose? No Did you need to seek medical attention at a hospital or doctor's office? No When did it last happen?      10 + years If all above answers are "NO", may proceed with cephalosporin use.    Doxycycline     " sick and weak"   Acetaminophen-Codeine Hives and Rash   Allopurinol Hives and Rash   Tramadol Hives and Rash     PHYSICAL EXAM: *** ECOG PERFORMANCE STATUS: {CHL ONC ECOG PS:[id number]}  There  were no vitals filed for this visit. There were no vitals filed for this visit. Physical Exam   LABORATORY DATA:  I have reviewed the labs as listed.  CBC    Component Value Date/Time   WBC 4.8 08/28/2022 0837   RBC 4.01 08/28/2022 0837   HGB 11.2 (L) 08/28/2022 0837   HCT 37.1 08/28/2022 0837   PLT 306 08/28/2022 0837   MCV 92.5 08/28/2022 0837   MCH 27.9 08/28/2022 0837   MCHC 30.2 08/28/2022 0837   RDW 15.2 08/28/2022 0837   LYMPHSABS 1.8 07/02/2022 0943   MONOABS 0.4 07/02/2022 0943   EOSABS 0.2 07/02/2022 0943   BASOSABS 0.0 07/02/2022 0943      Latest Ref Rng & Units 08/15/2022   10:13 AM 06/23/2022    6:42 AM 06/22/2022    5:05 AM  CMP  Glucose 70 - 99 mg/dL 122  109  98   BUN 8 - 23 mg/dL '23  27  31   '$ Creatinine 0.44 - 1.00 mg/dL 1.84  2.19  2.24   Sodium 135 - 145 mmol/L 138  138  140   Potassium 3.5 - 5.1 mmol/L 4.6  4.1  3.5   Chloride 98 - 111 mmol/L 104  103  106   CO2 22 - 32 mmol/L '28  29  27   '$ Calcium 8.9 - 10.3 mg/dL 8.9  9.0  8.7     DIAGNOSTIC IMAGING:  I have independently reviewed the relevant imaging and discussed with the patient.  ASSESSMENT & PLAN: 1.  Iron deficiency anemia - Seen at the request of Dr. 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  She takes Protonix and self administers  SQ octreotide twice daily.  She has been recommended for outpatient follow-up with [facility] for possible double-balloon enteroscopy. *** - Last endoscopic evaluation in December 2021 (Dr. 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 - Recently hospitalized from 06/21/2022 through 06/23/2022 for symptomatic anemia.  Received 1 unit PRBC and IV Feraheme x500 mg during hospitalization. - She is on Eliquis for atrial fibrillation, cleared by GI to continue anticoagulation at this time. - Hematology work-up (07/02/2022): Mildly elevated reticulocytes 3.7%.  Normal LDH 140. CBC with Hgb at 9.7/MCV 92.2. Normal folate, copper, B12, MMA, homocystine. Negative SPEP and immunofixation.  Mildly elevated free light chain ratio in keeping with CKD stage IV. - She has intermittent bright red blood per rectum and melena, most recently noted 3 weeks ago.*** - Symptomatic with fatigue, ice pica, lightheadedness, palpitations, dyspnea on exertion.*** - Failed to improve on oral iron supplementation, which has since been discontinued. - Started on Retacrit 10,000 units every 2 weeks on 07/12/2022. - Received IV Venofer 400 mg x 2 doses on 07/09/2022 and 07/16/2022 - Labs today *** - PLAN: *** IV iron *** - Retacrit 10,000 units every 2 weeks, starting on 07/12/2022 *** Retacrit dose?  *** - GI follow-up, pending double-balloon enteroscopy at [facility] *** - Repeat CBC and iron panel with RTC in 2 months with same-day visit    2.  Other history - PMH: CKD stage IV, secondary hyperparathyroidism, chronic diastolic CHF, atrial fibrillation, hypertension, GERD, peripheral arterial disease, mild COPD, and iron deficiency from GI bleeds  - SOCIAL: She is retired and lives at home alone.  She is able to ambulate short distances without difficulty and is independent with ADLs.  She is a former smoker, smokes 1 PPD for 25 years, quit in 1998.  She denies any alcohol or illicit drug use. - FAMILY: Family history positive for mother with unspecified anemia.  Her sister had breast cancer.  Maternal grandmother also had breast cancer.   PLAN SUMMARY & DISPOSITION: ***  All  questions were answered. The patient knows to call the clinic with any problems, questions or concerns.  Medical decision making: ***  Time spent on visit: I spent *** minutes counseling the patient face to face. The total time spent in the appointment was *** minutes and more than 50% was on counseling.   Harriett Rush, PA-C  ***

## 2022-09-11 ENCOUNTER — Encounter: Payer: Self-pay | Admitting: Physician Assistant

## 2022-09-11 ENCOUNTER — Inpatient Hospital Stay (HOSPITAL_BASED_OUTPATIENT_CLINIC_OR_DEPARTMENT_OTHER): Payer: Medicare HMO | Admitting: Physician Assistant

## 2022-09-11 ENCOUNTER — Inpatient Hospital Stay: Payer: Medicare HMO

## 2022-09-11 VITALS — BP 153/62 | HR 57 | Temp 97.0°F | Resp 18 | Ht 62.0 in | Wt 172.9 lb

## 2022-09-11 DIAGNOSIS — D631 Anemia in chronic kidney disease: Secondary | ICD-10-CM | POA: Diagnosis not present

## 2022-09-11 DIAGNOSIS — D500 Iron deficiency anemia secondary to blood loss (chronic): Secondary | ICD-10-CM

## 2022-09-11 DIAGNOSIS — N184 Chronic kidney disease, stage 4 (severe): Secondary | ICD-10-CM

## 2022-09-11 DIAGNOSIS — I130 Hypertensive heart and chronic kidney disease with heart failure and stage 1 through stage 4 chronic kidney disease, or unspecified chronic kidney disease: Secondary | ICD-10-CM | POA: Diagnosis not present

## 2022-09-11 HISTORY — DX: Anemia in chronic kidney disease: D63.1

## 2022-09-11 LAB — CBC WITH DIFFERENTIAL/PLATELET
Abs Immature Granulocytes: 0.01 10*3/uL (ref 0.00–0.07)
Basophils Absolute: 0.1 10*3/uL (ref 0.0–0.1)
Basophils Relative: 1 %
Eosinophils Absolute: 0.3 10*3/uL (ref 0.0–0.5)
Eosinophils Relative: 6 %
HCT: 34.1 % — ABNORMAL LOW (ref 36.0–46.0)
Hemoglobin: 10.3 g/dL — ABNORMAL LOW (ref 12.0–15.0)
Immature Granulocytes: 0 %
Lymphocytes Relative: 43 %
Lymphs Abs: 2.1 10*3/uL (ref 0.7–4.0)
MCH: 28.3 pg (ref 26.0–34.0)
MCHC: 30.2 g/dL (ref 30.0–36.0)
MCV: 93.7 fL (ref 80.0–100.0)
Monocytes Absolute: 0.5 10*3/uL (ref 0.1–1.0)
Monocytes Relative: 11 %
Neutro Abs: 1.9 10*3/uL (ref 1.7–7.7)
Neutrophils Relative %: 39 %
Platelets: 281 10*3/uL (ref 150–400)
RBC: 3.64 MIL/uL — ABNORMAL LOW (ref 3.87–5.11)
RDW: 14.9 % (ref 11.5–15.5)
WBC: 4.8 10*3/uL (ref 4.0–10.5)
nRBC: 0 % (ref 0.0–0.2)

## 2022-09-11 LAB — IRON AND TIBC
Iron: 61 ug/dL (ref 28–170)
Saturation Ratios: 18 % (ref 10.4–31.8)
TIBC: 347 ug/dL (ref 250–450)
UIBC: 286 ug/dL

## 2022-09-11 LAB — FERRITIN: Ferritin: 52 ng/mL (ref 11–307)

## 2022-09-11 MED ORDER — EPOETIN ALFA-EPBX 10000 UNIT/ML IJ SOLN
10000.0000 [IU] | Freq: Once | INTRAMUSCULAR | Status: AC
  Administered 2022-09-11: 10000 [IU] via SUBCUTANEOUS
  Filled 2022-09-11: qty 1

## 2022-09-11 NOTE — Patient Instructions (Signed)
[facility]  Discharge Instructions: Thank you for choosing [facility] to provide your oncology and hematology care.  If you have a lab appointment with the Cancer Center, please come in thru the Main Entrance and check in at the main information desk.  Wear comfortable clothing and clothing appropriate for easy access to any Portacath or PICC line.   We strive to give you quality time with your provider. You may need to reschedule your appointment if you arrive late (15 or more minutes).  Arriving late affects you and other patients whose appointments are after yours.  Also, if you miss three or more appointments without notifying the office, you may be dismissed from the clinic at the provider's discretion.      For prescription refill requests, have your pharmacy contact our office and allow 72 hours for refills to be completed.    Today you received the following Retacrit, return as scheduled.   To help prevent nausea and vomiting after your treatment, we encourage you to take your nausea medication as directed.  BELOW ARE SYMPTOMS THAT SHOULD BE REPORTED IMMEDIATELY: *FEVER GREATER THAN 100.4 F (38 C) OR HIGHER *CHILLS OR SWEATING *NAUSEA AND VOMITING THAT IS NOT CONTROLLED WITH YOUR NAUSEA MEDICATION *UNUSUAL SHORTNESS OF BREATH *UNUSUAL BRUISING OR BLEEDING *URINARY PROBLEMS (pain or burning when urinating, or frequent urination) *BOWEL PROBLEMS (unusual diarrhea, constipation, pain near the anus) TENDERNESS IN MOUTH AND THROAT WITH OR WITHOUT PRESENCE OF ULCERS (sore throat, sores in mouth, or a toothache) UNUSUAL RASH, SWELLING OR PAIN  UNUSUAL VAGINAL DISCHARGE OR ITCHING   Items with * indicate a potential emergency and should be followed up as soon as possible or go to the Emergency Department if any problems should occur.  Please show the CHEMOTHERAPY ALERT CARD or IMMUNOTHERAPY ALERT CARD at check-in to the Emergency Department and  triage nurse.  Should you have questions after your visit or need to cancel or reschedule your appointment, please contact [facility] [phone]  and follow the prompts.  Office hours are 8:00 a.m. to 4:30 p.m. Monday - Friday. Please note that voicemails left after 4:00 p.m. may not be returned until the following business day.  We are closed weekends and major holidays. You have access to a nurse at all times for urgent questions. Please call the main number to the clinic [phone] and follow the prompts.  For any non-urgent questions, you may also contact your provider using MyChart. We now offer e-Visits for anyone 18 and older to request care online for non-urgent symptoms. For details visit [email or web address].   Also download the MyChart app! Go to the app store, search "MyChart", open the app, select [facility], and log in with your MyChart username and password.  Masks are optional in the cancer centers. If you would like for your care team to wear a mask while they are taking care of you, please let them know. You may have one support person who is at least 76 years old accompany you for your appointments.  

## 2022-09-11 NOTE — Patient Instructions (Signed)
[name] [name] at [facility] Discharge Instructions  You were seen today by Tarri Abernethy PA-C for your anemia.  Your anemia is most secondary to your chronic kidney disease and iron deficiency from your history of GI bleeding.  Your iron levels are improved, but still low.  We will treat you with additional IV iron x2 doses.  We will continue to treat your anemia of chronic kidney disease with Retacrit injections.  (You will be receiving these here at the [facility] now, instead of at [facility].  You will need to have your labs checked downstairs before each injection.)  We will recheck labs and see you for office visit in 2 months.  Continue to follow-up with Dr. Theador Hawthorne for your chronic kidney disease, and Dr. Jenetta Downer as well as [facility] for your chronic GI bleeding.   Thank you for choosing [facility] at [facility] to provide your oncology and hematology care.  To afford each patient quality time with our provider, please arrive at least 15 minutes before your scheduled appointment time.   If you have a lab appointment with the [facility] please come in thru the Main Entrance and check in at the main information desk.  You need to re-schedule your appointment should you arrive 10 or more minutes late.  We strive to give you quality time with our providers, and arriving late affects you and other patients whose appointments are after yours.  Also, if you no show three or more times for appointments you may be dismissed from the clinic at the providers discretion.     Again, thank you for choosing [facility].  Our hope is that these requests will decrease the amount of time that you wait before being seen by our physicians.       _____________________________________________________________  Should you have questions after your visit to [facility], please contact our office at [phone]  and follow the prompts.  Our office hours are 8:00 a.m. and 4:30 p.m. Monday - Friday.  Please note that voicemails left after 4:00 p.m. may not be returned until the following business day.  We are closed weekends and major holidays.  You do have access to a nurse 24-7, just call the main number to the clinic [phone] and do not press any options, hold on the line and a nurse will answer the phone.    For prescription refill requests, have your pharmacy contact our office and allow 72 hours.    Due to Covid, you will need to wear a mask upon entering the hospital. If you do not have a mask, a mask will be given to you at the Main Entrance upon arrival. For doctor visits, patients may have 1 support person age 85 or older with them. For treatment visits, patients can not have anyone with them due to social distancing guidelines and our immunocompromised population.

## 2022-09-11 NOTE — Progress Notes (Signed)
Patient presents today for Retacrit injection. Hemoglobin reviewed prior to administration. VSS tolerated without incident or complaint. See MAR for details. Patient stable during and after injection. Patient discharged in satisfactory condition with no s/s of distress noted.  

## 2022-09-13 ENCOUNTER — Other Ambulatory Visit: Payer: Self-pay

## 2022-09-13 NOTE — Progress Notes (Signed)
Insurance requiring Procrit (epoetin alfa) not Retacrit.  Orders modified to cover brand now covered.  Procrit 10,000 units SQ q 2 weeks.  Henreitta Leber, PharmD 09/13/22 @ [phone]

## 2022-09-18 ENCOUNTER — Inpatient Hospital Stay: Payer: Medicare HMO | Attending: Hematology

## 2022-09-18 VITALS — BP 118/56 | HR 62 | Temp 97.5°F | Resp 18

## 2022-09-18 DIAGNOSIS — N184 Chronic kidney disease, stage 4 (severe): Secondary | ICD-10-CM | POA: Insufficient documentation

## 2022-09-18 DIAGNOSIS — I129 Hypertensive chronic kidney disease with stage 1 through stage 4 chronic kidney disease, or unspecified chronic kidney disease: Secondary | ICD-10-CM | POA: Insufficient documentation

## 2022-09-18 DIAGNOSIS — D631 Anemia in chronic kidney disease: Secondary | ICD-10-CM | POA: Diagnosis not present

## 2022-09-18 DIAGNOSIS — D500 Iron deficiency anemia secondary to blood loss (chronic): Secondary | ICD-10-CM | POA: Insufficient documentation

## 2022-09-18 MED ORDER — ACETAMINOPHEN 325 MG PO TABS
650.0000 mg | ORAL_TABLET | Freq: Once | ORAL | Status: AC
  Administered 2022-09-18: 650 mg via ORAL
  Filled 2022-09-18: qty 2

## 2022-09-18 MED ORDER — LORATADINE 10 MG PO TABS
10.0000 mg | ORAL_TABLET | Freq: Once | ORAL | Status: AC
  Administered 2022-09-18: 10 mg via ORAL
  Filled 2022-09-18: qty 1

## 2022-09-18 MED ORDER — SODIUM CHLORIDE 0.9 % IV SOLN: Freq: Once | INTRAVENOUS | Status: AC

## 2022-09-18 MED ORDER — SODIUM CHLORIDE 0.9 % IV SOLN
300.0000 mg | Freq: Once | INTRAVENOUS | Status: AC
  Administered 2022-09-18: 300 mg via INTRAVENOUS
  Filled 2022-09-18: qty 300

## 2022-09-18 NOTE — Progress Notes (Signed)
Patient tolerated iron infusion with no complaints voiced.  Peripheral IV site clean and dry with good blood return noted before and after infusion.  Band aid applied.  VSS with discharge and left in satisfactory condition with no s/s of distress noted.   

## 2022-09-18 NOTE — Patient Instructions (Signed)
[facility]  Discharge Instructions: Thank you for choosing [facility] to provide your oncology and hematology care.  If you have a lab appointment with the Cancer Center, please come in thru the Main Entrance and check in at the main information desk.  Wear comfortable clothing and clothing appropriate for easy access to any Portacath or PICC line.   We strive to give you quality time with your provider. You may need to reschedule your appointment if you arrive late (15 or more minutes).  Arriving late affects you and other patients whose appointments are after yours.  Also, if you miss three or more appointments without notifying the office, you may be dismissed from the clinic at the provider's discretion.      For prescription refill requests, have your pharmacy contact our office and allow 72 hours for refills to be completed.    Today you received the following Venofer, return as scheduled.   To help prevent nausea and vomiting after your treatment, we encourage you to take your nausea medication as directed.  BELOW ARE SYMPTOMS THAT SHOULD BE REPORTED IMMEDIATELY: *FEVER GREATER THAN 100.4 F (38 C) OR HIGHER *CHILLS OR SWEATING *NAUSEA AND VOMITING THAT IS NOT CONTROLLED WITH YOUR NAUSEA MEDICATION *UNUSUAL SHORTNESS OF BREATH *UNUSUAL BRUISING OR BLEEDING *URINARY PROBLEMS (pain or burning when urinating, or frequent urination) *BOWEL PROBLEMS (unusual diarrhea, constipation, pain near the anus) TENDERNESS IN MOUTH AND THROAT WITH OR WITHOUT PRESENCE OF ULCERS (sore throat, sores in mouth, or a toothache) UNUSUAL RASH, SWELLING OR PAIN  UNUSUAL VAGINAL DISCHARGE OR ITCHING   Items with * indicate a potential emergency and should be followed up as soon as possible or go to the Emergency Department if any problems should occur.  Please show the CHEMOTHERAPY ALERT CARD or IMMUNOTHERAPY ALERT CARD at check-in to the Emergency Department and triage  nurse.  Should you have questions after your visit or need to cancel or reschedule your appointment, please contact [facility] [phone]  and follow the prompts.  Office hours are 8:00 a.m. to 4:30 p.m. Monday - Friday. Please note that voicemails left after 4:00 p.m. may not be returned until the following business day.  We are closed weekends and major holidays. You have access to a nurse at all times for urgent questions. Please call the main number to the clinic [phone] and follow the prompts.  For any non-urgent questions, you may also contact your provider using MyChart. We now offer e-Visits for anyone 18 and older to request care online for non-urgent symptoms. For details visit [email or web address].   Also download the MyChart app! Go to the app store, search "MyChart", open the app, select [facility], and log in with your MyChart username and password.  Masks are optional in the cancer centers. If you would like for your care team to wear a mask while they are taking care of you, please let them know. You may have one support person who is at least 76 years old accompany you for your appointments.  

## 2022-09-19 ENCOUNTER — Other Ambulatory Visit: Payer: Self-pay | Admitting: Internal Medicine

## 2022-09-25 ENCOUNTER — Inpatient Hospital Stay: Payer: Medicare HMO | Attending: Hematology

## 2022-09-25 ENCOUNTER — Inpatient Hospital Stay: Payer: Medicare HMO

## 2022-09-25 VITALS — BP 119/57 | HR 58 | Temp 98.1°F | Resp 16

## 2022-09-25 DIAGNOSIS — D631 Anemia in chronic kidney disease: Secondary | ICD-10-CM

## 2022-09-25 DIAGNOSIS — D500 Iron deficiency anemia secondary to blood loss (chronic): Secondary | ICD-10-CM

## 2022-09-25 DIAGNOSIS — I129 Hypertensive chronic kidney disease with stage 1 through stage 4 chronic kidney disease, or unspecified chronic kidney disease: Secondary | ICD-10-CM | POA: Insufficient documentation

## 2022-09-25 DIAGNOSIS — Z79899 Other long term (current) drug therapy: Secondary | ICD-10-CM | POA: Diagnosis not present

## 2022-09-25 DIAGNOSIS — N184 Chronic kidney disease, stage 4 (severe): Secondary | ICD-10-CM | POA: Diagnosis not present

## 2022-09-25 LAB — CBC
HCT: 34.7 % — ABNORMAL LOW (ref 36.0–46.0)
Hemoglobin: 10.6 g/dL — ABNORMAL LOW (ref 12.0–15.0)
MCH: 28.2 pg (ref 26.0–34.0)
MCHC: 30.5 g/dL (ref 30.0–36.0)
MCV: 92.3 fL (ref 80.0–100.0)
Platelets: 283 10*3/uL (ref 150–400)
RBC: 3.76 MIL/uL — ABNORMAL LOW (ref 3.87–5.11)
RDW: 14.7 % (ref 11.5–15.5)
WBC: 4.8 10*3/uL (ref 4.0–10.5)
nRBC: 0 % (ref 0.0–0.2)

## 2022-09-25 MED ORDER — EPOETIN ALFA 10000 UNIT/ML IJ SOLN
10000.0000 [IU] | Freq: Once | INTRAMUSCULAR | Status: AC
  Administered 2022-09-25: 10000 [IU] via INTRAVENOUS
  Filled 2022-09-25: qty 1

## 2022-09-25 NOTE — Patient Instructions (Signed)
[facility] [place]  Discharge Instructions: Thank you for choosing [facility] to provide your oncology and hematology care.  If you have a lab appointment with the Cancer Center, please come in thru the Main Entrance and check in at the main information desk.  Wear comfortable clothing and clothing appropriate for easy access to any Portacath or PICC line.   We strive to give you quality time with your provider. You may need to reschedule your appointment if you arrive late (15 or more minutes).  Arriving late affects you and other patients whose appointments are after yours.  Also, if you miss three or more appointments without notifying the office, you may be dismissed from the clinic at the provider's discretion.      For prescription refill requests, have your pharmacy contact our office and allow 72 hours for refills to be completed.    Today you received the following chemotherapy and/or immunotherapy agents Epogen      To help prevent nausea and vomiting after your treatment, we encourage you to take your nausea medication as directed.  BELOW ARE SYMPTOMS THAT SHOULD BE REPORTED IMMEDIATELY: *FEVER GREATER THAN 100.4 F (38 C) OR HIGHER *CHILLS OR SWEATING *NAUSEA AND VOMITING THAT IS NOT CONTROLLED WITH YOUR NAUSEA MEDICATION *UNUSUAL SHORTNESS OF BREATH *UNUSUAL BRUISING OR BLEEDING *URINARY PROBLEMS (pain or burning when urinating, or frequent urination) *BOWEL PROBLEMS (unusual diarrhea, constipation, pain near the anus) TENDERNESS IN MOUTH AND THROAT WITH OR WITHOUT PRESENCE OF ULCERS (sore throat, sores in mouth, or a toothache) UNUSUAL RASH, SWELLING OR PAIN  UNUSUAL VAGINAL DISCHARGE OR ITCHING   Items with * indicate a potential emergency and should be followed up as soon as possible or go to the Emergency Department if any problems should occur.  Please show the CHEMOTHERAPY ALERT CARD or IMMUNOTHERAPY ALERT CARD at check-in to the Emergency  Department and triage nurse.  Should you have questions after your visit or need to cancel or reschedule your appointment, please contact [facility] [phone]  and follow the prompts.  Office hours are 8:00 a.m. to 4:30 p.m. Monday - Friday. Please note that voicemails left after 4:00 p.m. may not be returned until the following business day.  We are closed weekends and major holidays. You have access to a nurse at all times for urgent questions. Please call the main number to the clinic [phone] and follow the prompts.  For any non-urgent questions, you may also contact your provider using MyChart. We now offer e-Visits for anyone 18 and older to request care online for non-urgent symptoms. For details visit [email or web address].   Also download the MyChart app! Go to the app store, search "MyChart", open the app, select [facility], and log in with your MyChart username and password.  Masks are optional in the cancer centers. If you would like for your care team to wear a mask while they are taking care of you, please let them know. You may have one support person who is at least 76 years old accompany you for your appointments.  

## 2022-09-25 NOTE — Progress Notes (Signed)
Patient presents today for Epogen injection per providers order.  Hgb noted to be 10.6.  Patient has no new complaints at this time.  Stable during administration without incident; injection site WNL; see MAR for injection details.  Patient tolerated procedure well and without incident.  No questions or complaints noted at this time.

## 2022-10-02 ENCOUNTER — Telehealth: Payer: Self-pay | Admitting: *Deleted

## 2022-10-02 NOTE — Telephone Encounter (Signed)
   Pre-operative Risk Assessment    Patient Name: [name] [name]  DOB: 12-09-1946 MRN: [id number]      Request for Surgical Clearance    Procedure:  COLONOSCOPY WITH POLYPECTOMY   Date of Surgery:  Clearance 10/08/22                                 Surgeon:   Surgeon's Group or Practice Name:  [facility] Phone number:  [phone] Fax number:  [phone]   Type of Clearance Requested:   - Pharmacy:  Hold Apixaban (Eliquis) X'S 2 DAYS    Type of Anesthesia:  Not Indicated   Additional requests/questions:    [name]   10/02/2022, 7:16 AM

## 2022-10-03 ENCOUNTER — Telehealth: Payer: Self-pay | Admitting: *Deleted

## 2022-10-03 DIAGNOSIS — I272 Pulmonary hypertension, unspecified: Secondary | ICD-10-CM | POA: Insufficient documentation

## 2022-10-03 NOTE — Telephone Encounter (Signed)
Patient with diagnosis of atrial fibrillation on Eliquis for anticoagulation.    Procedure: colonoscopy with polypectomy Date of procedure: 10/08/22   CHA2DS2-VASc Score = 4   This indicates a 4.8% annual risk of stroke. The patient's score is based upon: CHF History: 0 HTN History: 1 Diabetes History: 0 Stroke History: 0 Vascular Disease History: 0 Age Score: 2 Gender Score: 1    CrCl 32 Platelet count 281  Per office protocol, patient can hold Eliquis for 2 days prior to procedure.   Patient will not need bridging with Lovenox (enoxaparin) around procedure.  **This guidance is not considered finalized until pre-operative APP has relayed final recommendations.**

## 2022-10-03 NOTE — Telephone Encounter (Signed)
Pt agreeable to plan of care for tele pre op appt 10/05/22 @ 3:20 add on per pre op provider today, due to procedure date and med hold. Pt tells me the procedure she is having is endoscopy and not colonoscopy. Med rec and consent are done.     Patient Consent for Virtual Visit        [name] [name] has provided verbal consent on 10/03/2022 for a virtual visit (video or telephone).   CONSENT FOR VIRTUAL VISIT FOR:  [place]  By participating in this virtual visit I agree to the following:  I hereby voluntarily request, consent and authorize [facility] and its employed or contracted physicians, physician assistants, nurse practitioners or other licensed health care professionals (the Practitioner), to provide me with telemedicine health care services (the "Services") as deemed necessary by the treating Practitioner. I acknowledge and consent to receive the Services by the Practitioner via telemedicine. I understand that the telemedicine visit will involve communicating with the Practitioner through live audiovisual communication technology and the disclosure of certain medical information by electronic transmission. I acknowledge that I have been given the opportunity to request an in-person assessment or other available alternative prior to the telemedicine visit and am voluntarily participating in the telemedicine visit.  I understand that I have the right to withhold or withdraw my consent to the use of telemedicine in the course of my care at any time, without affecting my right to future care or treatment, and that the Practitioner or I may terminate the telemedicine visit at any time. I understand that I have the right to inspect all information obtained and/or recorded in the course of the telemedicine visit and may receive copies of available information for a reasonable fee.  I understand that some of the potential risks of receiving the Services via telemedicine include:   Delay or interruption in medical evaluation due to technological equipment failure or disruption; Information transmitted may not be sufficient (e.g. poor resolution of images) to allow for appropriate medical decision making by the Practitioner; and/or  In rare instances, security protocols could fail, causing a breach of personal health information.  Furthermore, I acknowledge that it is my responsibility to provide information about my medical history, conditions and care that is complete and accurate to the best of my ability. I acknowledge that Practitioner's advice, recommendations, and/or decision may be based on factors not within their control, such as incomplete or inaccurate data provided by me or distortions of diagnostic images or specimens that may result from electronic transmissions. I understand that the practice of medicine is not an exact science and that Practitioner makes no warranties or guarantees regarding treatment outcomes. I acknowledge that a copy of this consent can be made available to me via my patient portal ([facility]), or I can request a printed copy by calling the office of [facility].    I understand that my insurance will be billed for this visit.   I have read or had this consent read to me. I understand the contents of this consent, which adequately explains the benefits and risks of the Services being provided via telemedicine.  I have been provided ample opportunity to ask questions regarding this consent and the Services and have had my questions answered to my satisfaction. I give my informed consent for the services to be provided through the use of telemedicine in my medical care

## 2022-10-03 NOTE — Telephone Encounter (Signed)
Primary Cardiologist:Samuel Domenic Polite, MD   Preoperative team, please contact this patient and set up a phone call appointment for further preoperative risk assessment. Please obtain consent and complete medication review. Thank you for your help.   I confirm that guidance regarding antiplatelet and oral anticoagulation therapy has been completed and, if necessary, noted below.  Emmaline Life, NP-C  10/03/2022, 8:03 AM [address], [address] Office [phone] Fax [phone]

## 2022-10-03 NOTE — Telephone Encounter (Signed)
Pt agreeable to plan of care for tele pre op appt 10/05/22 @ 3:20 add on per pre op provider today, due to procedure date and med hold. Pt tells me the procedure she is having is endoscopy and not colonoscopy. Med rec and consent are done.

## 2022-10-05 ENCOUNTER — Encounter: Payer: Medicare HMO | Attending: Interventional Cardiology | Admitting: Physician Assistant

## 2022-10-05 ENCOUNTER — Inpatient Hospital Stay: Payer: Medicare HMO

## 2022-10-05 VITALS — BP 141/55 | HR 55 | Temp 96.3°F | Resp 18

## 2022-10-05 DIAGNOSIS — Z01810 Encounter for preprocedural cardiovascular examination: Secondary | ICD-10-CM | POA: Diagnosis not present

## 2022-10-05 DIAGNOSIS — D631 Anemia in chronic kidney disease: Secondary | ICD-10-CM

## 2022-10-05 DIAGNOSIS — D500 Iron deficiency anemia secondary to blood loss (chronic): Secondary | ICD-10-CM

## 2022-10-05 DIAGNOSIS — I129 Hypertensive chronic kidney disease with stage 1 through stage 4 chronic kidney disease, or unspecified chronic kidney disease: Secondary | ICD-10-CM | POA: Diagnosis not present

## 2022-10-05 MED ORDER — SODIUM CHLORIDE 0.9 % IV SOLN: Freq: Once | INTRAVENOUS | Status: AC

## 2022-10-05 MED ORDER — LORATADINE 10 MG PO TABS
10.0000 mg | ORAL_TABLET | Freq: Once | ORAL | Status: AC
  Administered 2022-10-05: 10 mg via ORAL

## 2022-10-05 MED ORDER — ACETAMINOPHEN 325 MG PO TABS
650.0000 mg | ORAL_TABLET | Freq: Once | ORAL | Status: AC
  Administered 2022-10-05: 650 mg via ORAL

## 2022-10-05 MED ORDER — SODIUM CHLORIDE 0.9 % IV SOLN
300.0000 mg | Freq: Once | INTRAVENOUS | Status: AC
  Administered 2022-10-05: 300 mg via INTRAVENOUS
  Filled 2022-10-05: qty 300

## 2022-10-05 NOTE — Patient Instructions (Signed)
[facility]   Discharge Instructions: Thank you for choosing [facility] to provide your oncology and hematology care.  If you have a lab appointment with the Cancer Center, please come in thru the Main Entrance and check in at the main information desk.  Wear comfortable clothing and clothing appropriate for easy access to any Portacath or PICC line.   We strive to give you quality time with your provider. You may need to reschedule your appointment if you arrive late (15 or more minutes).  Arriving late affects you and other patients whose appointments are after yours.  Also, if you miss three or more appointments without notifying the office, you may be dismissed from the clinic at the provider's discretion.      For prescription refill requests, have your pharmacy contact our office and allow 72 hours for refills to be completed.    Today you received the following chemotherapy and/or immunotherapy agents Venofer 300 mg  Iron Sucrose Injection What is this medication? IRON SUCROSE (EYE ern SOO krose) treats low levels of iron (iron deficiency anemia) in people with kidney disease. Iron is a mineral that plays an important role in making red blood cells, which carry oxygen from your lungs to the rest of your body. This medicine may be used for other purposes; ask your health care provider or pharmacist if you have questions. COMMON BRAND NAME(S): Venofer What should I tell my care team before I take this medication? They need to know if you have any of these conditions: Anemia not caused by low iron levels Heart disease High levels of iron in the blood Kidney disease Liver disease An unusual or allergic reaction to iron, other medications, foods, dyes, or preservatives Pregnant or trying to get pregnant Breast-feeding How should I use this medication? This medication is for infusion into a vein. It is given in a hospital or clinic setting. Talk to  your care team about the use of this medication in children. While this medication may be prescribed for children as young as 2 years for selected conditions, precautions do apply. Overdosage: If you think you have taken too much of this medicine contact a poison control center or emergency room at once. NOTE: This medicine is only for you. Do not share this medicine with others. What if I miss a dose? It is important not to miss your dose. Call your care team if you are unable to keep an appointment. What may interact with this medication? Do not take this medication with any of the following: Deferoxamine Dimercaprol Other iron products This medication may also interact with the following: Chloramphenicol Deferasirox This list may not describe all possible interactions. Give your health care provider a list of all the medicines, herbs, non-prescription drugs, or dietary supplements you use. Also tell them if you smoke, drink alcohol, or use illegal drugs. Some items may interact with your medicine. What should I watch for while using this medication? Visit your care team regularly. Tell your care team if your symptoms do not start to get better or if they get worse. You may need blood work done while you are taking this medication. You may need to follow a special diet. Talk to your care team. Foods that contain iron include: whole grains/cereals, dried fruits, beans, or peas, leafy green vegetables, and organ meats (liver, kidney). What side effects may I notice from receiving this medication? Side effects that you should report to your care team as   soon as possible: Allergic reactions--skin rash, itching, hives, swelling of the face, lips, tongue, or throat Low blood pressure--dizziness, feeling faint or lightheaded, blurry vision Shortness of breath Side effects that usually do not require medical attention (report to your care team if they continue or are  bothersome): Flushing Headache Joint pain Muscle pain Nausea Pain, redness, or irritation at injection site This list may not describe all possible side effects. Call your doctor for medical advice about side effects. You may report side effects to FDA at 1-800-FDA-1088. Where should I keep my medication? This medication is given in a hospital or clinic and will not be stored at home. NOTE: This sheet is a summary. It may not cover all possible information. If you have questions about this medicine, talk to your doctor, pharmacist, or health care provider.  2023 Elsevier/Gold Standard (2008-01-31 00:00:00)       To help prevent nausea and vomiting after your treatment, we encourage you to take your nausea medication as directed.  BELOW ARE SYMPTOMS THAT SHOULD BE REPORTED IMMEDIATELY: *FEVER GREATER THAN 100.4 F (38 C) OR HIGHER *CHILLS OR SWEATING *NAUSEA AND VOMITING THAT IS NOT CONTROLLED WITH YOUR NAUSEA MEDICATION *UNUSUAL SHORTNESS OF BREATH *UNUSUAL BRUISING OR BLEEDING *URINARY PROBLEMS (pain or burning when urinating, or frequent urination) *BOWEL PROBLEMS (unusual diarrhea, constipation, pain near the anus) TENDERNESS IN MOUTH AND THROAT WITH OR WITHOUT PRESENCE OF ULCERS (sore throat, sores in mouth, or a toothache) UNUSUAL RASH, SWELLING OR PAIN  UNUSUAL VAGINAL DISCHARGE OR ITCHING   Items with * indicate a potential emergency and should be followed up as soon as possible or go to the Emergency Department if any problems should occur.  Please show the CHEMOTHERAPY ALERT CARD or IMMUNOTHERAPY ALERT CARD at check-in to the Emergency Department and triage nurse.  Should you have questions after your visit or need to cancel or reschedule your appointment, please contact [facility] [phone]  and follow the prompts.  Office hours are 8:00 a.m. to 4:30 p.m. Monday - Friday. Please note that voicemails left after 4:00 p.m. may not be returned until  the following business day.  We are closed weekends and major holidays. You have access to a nurse at all times for urgent questions. Please call the main number to the clinic [phone] and follow the prompts.  For any non-urgent questions, you may also contact your provider using MyChart. We now offer e-Visits for anyone 18 and older to request care online for non-urgent symptoms. For details visit [email or web address].   Also download the MyChart app! Go to the app store, search "MyChart", open the app, select [facility] [place], and log in with your MyChart username and password.  Masks are optional in the cancer centers. If you would like for your care team to wear a mask while they are taking care of you, please let them know. You may have one support person who is at least 76 years old accompany you for your appointments.  

## 2022-10-05 NOTE — Progress Notes (Signed)
Patient presents today for Venofer 300 mg iron infusion. MAR reviewed and updated. Vital signs stable. Patient denies any side effects related to last iron infusion.   Venofer given today per MD orders. Tolerated infusion without adverse affects. Vital signs stable. No complaints at this time. Discharged from clinic ambulatory in stable condition. Alert and oriented x 3. F/U with [facility] as scheduled.

## 2022-10-05 NOTE — Progress Notes (Signed)
Virtual Visit via Telephone Note   Because of [name] [name]'s co-morbid illnesses, she is at least at moderate risk for complications without adequate follow up.  This format is felt to be most appropriate for this patient at this time.  The patient did not have access to video technology/had technical difficulties with video requiring transitioning to audio format only (telephone).  All issues noted in this document were discussed and addressed.  No physical exam could be performed with this format.  Please refer to the patient's chart for her consent to telehealth for [facility].  Evaluation Performed:  Preoperative cardiovascular risk assessment _____________   Date:  10/05/2022   Patient ID:  [name] [name], [name] [birth year], MRN [id number] Patient Location:  Home Provider location:   Office  Primary Care Provider:  Andres Shad, MD Primary Cardiologist:  Rozann Lesches, MD  Chief Complaint / Patient Profile   76 y.o. y/o female with a h/o anemia and stage IV chronic kidney disease, GERD, hypertension, hyperlipidemia, OSA, PAF, syncope who is pending small bowel enteroscopy and presents today for telephonic preoperative cardiovascular risk assessment.  Past Medical History    Past Medical History:  Diagnosis Date   Anemia    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[place]) 09/11/2022   Chronic kidney disease    Constipation 07/17/2017   GERD (gastroesophageal reflux disease)    Gout    Grade II diastolic dysfunction 01/0/9323   Hyperlipidemia    Hypertension    Hypokalemia    OSA (obstructive sleep apnea) 08/09/2020   PAF (paroxysmal atrial fibrillation) (HCC)    PAF (paroxysmal atrial fibrillation) (HCC)    PUD (peptic ulcer disease)    Syncope    Neurocardiogenic   Past Surgical History:  Procedure Laterality Date   ABDOMINAL AORTOGRAM W/LOWER EXTREMITY Right 12/13/2020   Procedure: ABDOMINAL AORTOGRAM W/LOWER EXTREMITY;  Surgeon: Serafina Mitchell, MD;  Location: [place] [facility];  Service: Cardiovascular;  Laterality: Right;   ABDOMINAL HYSTERECTOMY     AMPUTATION Right 12/14/2020   Procedure: Right fifth toe amputation;  Surgeon: Rosetta Posner, MD;  Location: [facility] OR;  Service: Vascular;  Laterality: Right;   CATARACT EXTRACTION Bilateral    COLONOSCOPY N/A 10/07/2014   Dr. Oneida Alar: redundant left colon, moderate sized external hemorrhoids    COLONOSCOPY N/A 03/11/2020   Surgeon: Danie Binder, MD;  9 polyps, majority of which were tubular adenomas, nodular mucosa in the anus s/p biopsy which was benign.   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   ESOPHAGOGASTRODUODENOSCOPY (EGD) WITH PROPOFOL N/A 11/25/2020    Surgeon: Daneil Dolin, MD; Normal, s/p capsule deployment   [place] N/A 11/25/2020    Surgeon: Daneil Dolin, MD; 4/nonbleeding AVMs in the proximal small bowel large fresh blood, 2 bleeding lymphangiectasis in SB.    HOT HEMOSTASIS  11/27/2020   Procedure: HOT HEMOSTASIS (ARGON PLASMA COAGULATION/BICAP);  Surgeon: Montez Morita, Quillian Quince, MD;  [facility]: [facility];  Service: Gastroenterology;;   LIPOMA RESECTION     PERIPHERAL VASCULAR INTERVENTION Right 12/13/2020   Procedure: PERIPHERAL VASCULAR INTERVENTION;  Surgeon: Serafina Mitchell, MD;  Location: [place] [facility];  Service: Cardiovascular;  Laterality: Right;  SFA/PT/AT   POLYPECTOMY  03/11/2020   Procedure: POLYPECTOMY;  Surgeon: Danie Binder, MD;  Location: [facility];  Service:  Endoscopy;;  cecal, transverse, descending, sigmoid   TEE WITHOUT CARDIOVERSION N/A 05/10/2021   Procedure: TRANSESOPHAGEAL ECHOCARDIOGRAM (TEE) WATCHMEN EVALUATION;  Surgeon: Josue Hector, MD;  Location: [facility];  Service: Cardiovascular;  Laterality: N/A;    Allergies  Allergies  Allergen Reactions    Amlodipine     Felt she retained fluid in her chest    Amoxicillin Hives    Did it involve swelling of the face/tongue/throat, SOB, or low BP? No Did it involve sudden or severe rash/hives, skin peeling, or any reaction on the inside of your mouth or nose? No Did you need to seek medical attention at a hospital or doctor's office? No When did it last happen?      10 + years If all above answers are "NO", may proceed with cephalosporin use.    Doxycycline     " sick and weak"   Acetaminophen-Codeine Hives and Rash   Allopurinol Hives and Rash   Tramadol Hives and Rash    History of Present Illness    [name] [name] is a 76 y.o. female who presents via audio/video conferencing for a telehealth visit today.  Pt was last seen in cardiology clinic on 07/04/22 by Dr. Lovena Le.  At that time [name] [name] was doing well .  The patient is now pending procedure as outlined above. Since her last visit, she states she is feeling good without any issues.  She tells me she did have an A-fib episode back in June when she was hospitalized.  All her medications at same.  She has not had any chest pain but she does have some shortness of breath.  This has been stable.  She does not have any trouble with walking but she does better with a shopping cart when she is out and about.  She can do a flight of stairs.  She can do moderate household tasks.  She is scored a 5.07 METS on the DASI.  This exceeds the minimum 4 METS requirement.  We discussed holding her Eliquis 2 days prior to the procedure.  She will start holding her Eliquis tomorrow.  She can restart her medication once medically safe to do so.   Home Medications    Prior to Admission medications   Medication Sig Start Date End Date Taking? Authorizing Provider  acetaminophen (TYLENOL) 500 MG tablet Take 1,000 mg by mouth every 6 (six) hours as needed for moderate pain.    [provider]  albuterol (VENTOLIN HFA) 108 (90 Base) MCG/ACT  inhaler Inhale 2 puffs into the lungs every 6 (six) hours as needed for wheezing or shortness of breath. 06/23/22   Roxan Hockey, MD  calcitRIOL (ROCALTROL) 0.25 MCG capsule Take 0.25 mcg by mouth 3 (three) times a week. Monday,Wednesday and Fridays 04/18/22   [provider]  Cholecalciferol (VITAMIN D-3) 25 MCG (1000 UT) CAPS Take 1,000 Units by mouth daily.    [provider]  cyclobenzaprine (FLEXERIL) 5 MG tablet Take 5 mg by mouth 3 (three) times daily as needed for muscle spasms. 06/19/20   [provider]  diclofenac Sodium (VOLTAREN) 1 % GEL Apply 1 application topically 4 (four) times daily as needed (pain).    [provider]  ELIQUIS 5 MG TABS tablet TAKE 1 TABLET BY MOUTH TWICE A DAY 06/13/22   Evans Lance, MD  flecainide (TAMBOCOR) 50 MG tablet TAKE 1 TABLET BY MOUTH TWICE A DAY 06/06/22   Evans Lance, MD  furosemide (LASIX)  40 MG tablet Take 40 mg by mouth as directed. 40 MG IN THE AM AND 20 MG IN THE PM PER THE PT 10/03/22 09/07/22   [provider]  hydrocortisone (ANUSOL-HC) 2.5 % rectal cream Place 1 application rectally 2 (two) times daily. 03/14/22   Eloise Harman, DO  losartan (COZAAR) 50 MG tablet Take 1 tablet (50 mg total) by mouth daily. 11/08/21   Evans Lance, MD  lubiprostone (AMITIZA) 24 MCG capsule Take 1 capsule (24 mcg total) by mouth in the morning and at bedtime. 12/06/21 12/06/22  Eloise Harman, DO  octreotide (SANDOSTATIN) 100 MCG/ML SOLN injection Inject 1 mL (100 mcg total) into the skin every 12 (twelve) hours. 05/17/22 05/17/23  Eloise Harman, DO  ondansetron (ZOFRAN) 4 MG tablet Take 1 tablet (4 mg total) by mouth every 6 (six) hours as needed for nausea. 06/23/22   Roxan Hockey, MD  pantoprazole (PROTONIX) 40 MG tablet Take 1 tablet (40 mg total) by mouth daily. 06/23/22   Roxan Hockey, MD  polyethylene glycol (MIRALAX / GLYCOLAX) 17 g packet Take 17 g by mouth daily as needed for mild  constipation. 06/23/22   Roxan Hockey, MD  potassium chloride SA (KLOR-CON) 20 MEQ tablet Take 20 mEq by mouth in the morning, at noon, and at bedtime.    [provider]  rosuvastatin (CRESTOR) 10 MG tablet TAKE 1 TABLET BY MOUTH EVERY DAY 08/21/21   Fayrene Helper, MD  vitamin C (ASCORBIC ACID) 250 MG tablet Take 250 mg by mouth daily.    [provider]    Physical Exam    Vital Signs:  JAHNAE MCADOO does not have vital signs available for review today.  Given telephonic nature of communication, physical exam is limited. AAOx3. NAD. Normal affect.  Speech and respirations are unlabored.  Accessory Clinical Findings    None  Assessment & Plan    1.  Preoperative Cardiovascular Risk Assessment:  Ms. Fung's perioperative risk of a major cardiac event is 0.4% according to the Revised Cardiac Risk Index (RCRI).  Therefore, she is at low risk for perioperative complications.   Her functional capacity is good at 5.07 METs according to the [place] Activity Status Index (DASI). Recommendations: According to ACC/AHA guidelines, no further cardiovascular testing needed.  The patient may proceed to surgery at acceptable risk.   Antiplatelet and/or Anticoagulation Recommendations:  Eliquis (Apixaban) can be held for 2 days prior to surgery.  Please resume post op when felt to be safe.     A copy of this note will be routed to requesting surgeon.  Time:   Today, I have spent 8 minutes with the patient with telehealth technology discussing medical history, symptoms, and management plan.     Elgie Collard, PA-C  10/05/2022, 3:31 PM

## 2022-10-09 ENCOUNTER — Inpatient Hospital Stay: Payer: Medicare HMO

## 2022-10-09 DIAGNOSIS — I129 Hypertensive chronic kidney disease with stage 1 through stage 4 chronic kidney disease, or unspecified chronic kidney disease: Secondary | ICD-10-CM | POA: Diagnosis not present

## 2022-10-09 DIAGNOSIS — D631 Anemia in chronic kidney disease: Secondary | ICD-10-CM

## 2022-10-09 DIAGNOSIS — D500 Iron deficiency anemia secondary to blood loss (chronic): Secondary | ICD-10-CM

## 2022-10-09 LAB — CBC
HCT: 37.5 % (ref 36.0–46.0)
Hemoglobin: 11.4 g/dL — ABNORMAL LOW (ref 12.0–15.0)
MCH: 28.9 pg (ref 26.0–34.0)
MCHC: 30.4 g/dL (ref 30.0–36.0)
MCV: 94.9 fL (ref 80.0–100.0)
Platelets: 331 10*3/uL (ref 150–400)
RBC: 3.95 MIL/uL (ref 3.87–5.11)
RDW: 15.3 % (ref 11.5–15.5)
WBC: 7.5 10*3/uL (ref 4.0–10.5)
nRBC: 0 % (ref 0.0–0.2)

## 2022-10-09 NOTE — Progress Notes (Signed)
Hemoglobin 11.4, on injection needed today.

## 2022-10-22 ENCOUNTER — Telehealth: Payer: Self-pay

## 2022-10-22 NOTE — Telephone Encounter (Signed)
Pt called requesting an appt stating that she had three knots on her L leg that she was concerned about.  Reviewed pt's chart, returned call for clarification, two identifiers used. Informed pt that she had a recall in for May of this year, so she could be scheduled from that with studies. Appts scheduled. Confirmed understanding.

## 2022-10-23 ENCOUNTER — Other Ambulatory Visit (HOSPITAL_COMMUNITY)
Admission: RE | Admit: 2022-10-23 | Discharge: 2022-10-23 | Disposition: A | Discharge status: Home / Self Care | Payer: Medicare HMO | Source: Ambulatory Visit | Attending: Nephrology | Admitting: Nephrology

## 2022-10-23 ENCOUNTER — Inpatient Hospital Stay: Payer: Medicare HMO

## 2022-10-23 DIAGNOSIS — N184 Chronic kidney disease, stage 4 (severe): Secondary | ICD-10-CM | POA: Insufficient documentation

## 2022-10-23 DIAGNOSIS — D631 Anemia in chronic kidney disease: Secondary | ICD-10-CM

## 2022-10-23 DIAGNOSIS — D500 Iron deficiency anemia secondary to blood loss (chronic): Secondary | ICD-10-CM

## 2022-10-23 DIAGNOSIS — I129 Hypertensive chronic kidney disease with stage 1 through stage 4 chronic kidney disease, or unspecified chronic kidney disease: Secondary | ICD-10-CM | POA: Diagnosis not present

## 2022-10-23 LAB — RENAL FUNCTION PANEL
Albumin: 4.3 g/dL (ref 3.5–5.0)
Anion gap: 8 (ref 5–15)
BUN: 20 mg/dL (ref 8–23)
CO2: 28 mmol/L (ref 22–32)
Calcium: 9 mg/dL (ref 8.9–10.3)
Chloride: 101 mmol/L (ref 98–111)
Creatinine, Ser: 1.92 mg/dL — ABNORMAL HIGH (ref 0.44–1.00)
GFR, Estimated: 27 mL/min — ABNORMAL LOW (ref >60)
Glucose, Bld: 93 mg/dL (ref 70–99)
Phosphorus: 3.5 mg/dL (ref 2.5–4.6)
Potassium: 4.5 mmol/L (ref 3.5–5.1)
Sodium: 137 mmol/L (ref 135–145)

## 2022-10-23 LAB — CBC
HCT: 36.5 % (ref 36.0–46.0)
Hemoglobin: 11.2 g/dL — ABNORMAL LOW (ref 12.0–15.0)
MCH: 28.6 pg (ref 26.0–34.0)
MCHC: 30.7 g/dL (ref 30.0–36.0)
MCV: 93.4 fL (ref 80.0–100.0)
Platelets: 300 10*3/uL (ref 150–400)
RBC: 3.91 MIL/uL (ref 3.87–5.11)
RDW: 14.6 % (ref 11.5–15.5)
WBC: 5.1 10*3/uL (ref 4.0–10.5)
nRBC: 0 % (ref 0.0–0.2)

## 2022-10-23 LAB — PROTEIN / CREATININE RATIO, URINE
Creatinine, Urine: 74.27 mg/dL
Protein Creatinine Ratio: 0.13 mg/mg{Cre} (ref 0.00–0.15)
Total Protein, Urine: 10 mg/dL

## 2022-10-23 LAB — VITAMIN D 25 HYDROXY (VIT D DEFICIENCY, FRACTURES): Vit D, 25-Hydroxy: 51.43 ng/mL (ref 30–100)

## 2022-10-23 NOTE — Progress Notes (Signed)
Hold Epogen.  Hemoglobin is 11.2 today. Patient informed and advised to RTC as scheduled.  Patient left ambulatory with rollaider in stable condition.

## 2022-10-24 ENCOUNTER — Other Ambulatory Visit: Payer: Self-pay

## 2022-10-24 DIAGNOSIS — Z89421 Acquired absence of other right toe(s): Secondary | ICD-10-CM

## 2022-10-24 DIAGNOSIS — I739 Peripheral vascular disease, unspecified: Secondary | ICD-10-CM

## 2022-10-24 LAB — PARATHYROID HORMONE, INTACT (NO CA): PTH: 71 pg/mL — ABNORMAL HIGH (ref 15–65)

## 2022-10-28 ENCOUNTER — Other Ambulatory Visit: Payer: Self-pay | Admitting: Internal Medicine

## 2022-11-06 ENCOUNTER — Inpatient Hospital Stay: Payer: Medicare HMO

## 2022-11-06 ENCOUNTER — Inpatient Hospital Stay: Payer: Medicare HMO | Attending: Hematology | Admitting: Physician Assistant

## 2022-11-06 ENCOUNTER — Other Ambulatory Visit: Payer: Self-pay

## 2022-11-06 VITALS — BP 157/72 | HR 57 | Temp 97.0°F | Resp 18 | Ht 61.81 in | Wt 172.2 lb

## 2022-11-06 DIAGNOSIS — D631 Anemia in chronic kidney disease: Secondary | ICD-10-CM | POA: Diagnosis not present

## 2022-11-06 DIAGNOSIS — D500 Iron deficiency anemia secondary to blood loss (chronic): Secondary | ICD-10-CM

## 2022-11-06 DIAGNOSIS — I5032 Chronic diastolic (congestive) heart failure: Secondary | ICD-10-CM | POA: Insufficient documentation

## 2022-11-06 DIAGNOSIS — Z7901 Long term (current) use of anticoagulants: Secondary | ICD-10-CM | POA: Insufficient documentation

## 2022-11-06 DIAGNOSIS — I480 Paroxysmal atrial fibrillation: Secondary | ICD-10-CM | POA: Diagnosis not present

## 2022-11-06 DIAGNOSIS — D649 Anemia, unspecified: Secondary | ICD-10-CM

## 2022-11-06 DIAGNOSIS — I130 Hypertensive heart and chronic kidney disease with heart failure and stage 1 through stage 4 chronic kidney disease, or unspecified chronic kidney disease: Secondary | ICD-10-CM | POA: Insufficient documentation

## 2022-11-06 DIAGNOSIS — N184 Chronic kidney disease, stage 4 (severe): Secondary | ICD-10-CM | POA: Insufficient documentation

## 2022-11-06 DIAGNOSIS — Z803 Family history of malignant neoplasm of breast: Secondary | ICD-10-CM | POA: Diagnosis not present

## 2022-11-06 DIAGNOSIS — Z87891 Personal history of nicotine dependence: Secondary | ICD-10-CM | POA: Diagnosis not present

## 2022-11-06 LAB — COMPREHENSIVE METABOLIC PANEL
ALT: 19 U/L (ref 0–44)
AST: 22 U/L (ref 15–41)
Albumin: 4.3 g/dL (ref 3.5–5.0)
Alkaline Phosphatase: 80 U/L (ref 38–126)
Anion gap: 6 (ref 5–15)
BUN: 21 mg/dL (ref 8–23)
CO2: 29 mmol/L (ref 22–32)
Calcium: 9 mg/dL (ref 8.9–10.3)
Chloride: 102 mmol/L (ref 98–111)
Creatinine, Ser: 1.89 mg/dL — ABNORMAL HIGH (ref 0.44–1.00)
GFR, Estimated: 27 mL/min — ABNORMAL LOW (ref >60)
Glucose, Bld: 107 mg/dL — ABNORMAL HIGH (ref 70–99)
Potassium: 4.6 mmol/L (ref 3.5–5.1)
Sodium: 137 mmol/L (ref 135–145)
Total Bilirubin: 0.6 mg/dL (ref 0.3–1.2)
Total Protein: 7.5 g/dL (ref 6.5–8.1)

## 2022-11-06 LAB — CBC
HCT: 37.6 % (ref 36.0–46.0)
Hemoglobin: 11.5 g/dL — ABNORMAL LOW (ref 12.0–15.0)
MCH: 28.3 pg (ref 26.0–34.0)
MCHC: 30.6 g/dL (ref 30.0–36.0)
MCV: 92.4 fL (ref 80.0–100.0)
Platelets: 273 10*3/uL (ref 150–400)
RBC: 4.07 MIL/uL (ref 3.87–5.11)
RDW: 14.2 % (ref 11.5–15.5)
WBC: 5.8 10*3/uL (ref 4.0–10.5)
nRBC: 0 % (ref 0.0–0.2)

## 2022-11-06 LAB — IRON AND TIBC
Iron: 57 ug/dL (ref 28–170)
Saturation Ratios: 16 % (ref 10.4–31.8)
TIBC: 367 ug/dL (ref 250–450)
UIBC: 310 ug/dL

## 2022-11-06 LAB — FERRITIN: Ferritin: 64 ng/mL (ref 11–307)

## 2022-11-06 NOTE — Progress Notes (Signed)
[name] [name], [place] [address]   CLINIC:  Medical Oncology/Hematology  PCP:  Andres Shad, MD [address] [place] [address] [phone]   REASON FOR VISIT:  Follow-up for normocytic anemia  CURRENT THERAPY: IV iron and Retacrit  INTERVAL HISTORY:  [name] [name] 76 y.o. 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  She was last seen by Tarri Abernethy PA-C on 09/11/2022.  At today's visit, she reports feeling well.    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  She is on octreotide and her Eliquis has been resumed.  She has not noticed any melena or rectal bleeding since her small bowel endoscopy in October, and overall is feeling much better.  Her ice pica has resolved.  No chest pain, lightheadedness, or passing out.  Chronic dyspnea on exertion is at baseline.  She has 75% energy and 75% appetite. She endorses that she is maintaining a stable weight.   REVIEW OF SYSTEMS:  Review of Systems  Constitutional:  Positive for fatigue. Negative for appetite change, chills, diaphoresis, fever and unexpected weight change.  HENT:   Negative for lump/mass and nosebleeds.   Eyes:  Negative for eye problems.  Respiratory:  Positive for shortness of breath (with exertion). Negative for cough and hemoptysis.   Cardiovascular:  Negative for chest pain, leg swelling and palpitations.  Gastrointestinal:  Negative for abdominal pain, blood in stool, constipation, diarrhea, nausea and vomiting.  Genitourinary:  Negative for hematuria.   Skin: Negative.   Neurological:  Positive for numbness. Negative for  dizziness, headaches and light-headedness.  Hematological:  Does not bruise/bleed easily.      PAST MEDICAL/SURGICAL HISTORY:  Past Medical History:  Diagnosis Date   Anemia    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[facility]) 09/11/2022   Chronic kidney disease    Constipation 07/17/2017   GERD (gastroesophageal reflux disease)    Gout    Grade II diastolic dysfunction 46/04/6811   Hyperlipidemia    Hypertension    Hypokalemia    OSA (obstructive sleep apnea) 08/09/2020   PAF (paroxysmal atrial fibrillation) ([facility])    PAF (paroxysmal atrial fibrillation) (HCC)    PUD (peptic ulcer disease)    Syncope    Neurocardiogenic   Past Surgical History:  Procedure Laterality Date   ABDOMINAL AORTOGRAM W/LOWER EXTREMITY Right 12/13/2020   Procedure: ABDOMINAL AORTOGRAM W/LOWER EXTREMITY;  Surgeon: Serafina Mitchell, MD;  Location: [facility];  Service: Cardiovascular;  Laterality: Right;   ABDOMINAL HYSTERECTOMY     AMPUTATION Right 12/14/2020   Procedure: Right fifth toe amputation;  Surgeon: Rosetta Posner, MD;  Location: [facility] OR;  Service: Vascular;  Laterality: Right;   CATARACT EXTRACTION Bilateral    COLONOSCOPY N/A 10/07/2014   Dr. Oneida Alar: redundant left colon, moderate sized external hemorrhoids    COLONOSCOPY N/A 03/11/2020   Surgeon: Danie Binder, MD;  9 polyps, majority of which were tubular adenomas, nodular mucosa in the anus s/p biopsy which was benign.   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   ESOPHAGOGASTRODUODENOSCOPY (EGD) WITH PROPOFOL N/A 11/25/2020    Surgeon: Daneil Dolin, MD; Normal, s/p capsule deployment   [place] N/A 11/25/2020    Surgeon: Daneil Dolin, MD; 4/nonbleeding AVMs in the proximal small bowel large fresh blood, 2 bleeding lymphangiectasis in SB.    HOT HEMOSTASIS  11/27/2020    Procedure: HOT HEMOSTASIS (ARGON PLASMA COAGULATION/BICAP);  Surgeon: Montez Morita, Quillian Quince, MD;  [facility];  Service: Gastroenterology;;   LIPOMA RESECTION     PERIPHERAL VASCULAR INTERVENTION Right 12/13/2020   Procedure: PERIPHERAL VASCULAR INTERVENTION;  Surgeon: Serafina Mitchell, MD;  Location: [facility];  Service: Cardiovascular;  Laterality: Right;  SFA/PT/AT   POLYPECTOMY  03/11/2020   Procedure: POLYPECTOMY;  Surgeon: Danie Binder, MD;  Location: [facility];  Service: Endoscopy;;  cecal, transverse, descending, sigmoid   TEE WITHOUT CARDIOVERSION N/A 05/10/2021   Procedure: TRANSESOPHAGEAL ECHOCARDIOGRAM (TEE) WATCHMEN EVALUATION;  Surgeon: Josue Hector, MD;  Location: [facility] ENDOSCOPY;  Service: Cardiovascular;  Laterality: N/A;     SOCIAL HISTORY:  Social History   Socioeconomic History   Marital status: Single    Spouse name: Not on file   Number of children: Not on file   Years of education: Not on file   Highest education level: Not on file  Occupational History   Occupation: Retired  Tobacco Use   Smoking status: Former    Packs/day: 1.00    Years: 20.00    Total pack years: 20.00    Types: Cigarettes    Start date: 03/16/1964    Quit date: 10/07/1997    Years since quitting: 25.0   Smokeless tobacco: Never  Vaping Use   Vaping Use: Never used  Substance and Sexual Activity   Alcohol use: No    Alcohol/week: 0.0 standard drinks of alcohol   Drug use: No   Sexual activity: Yes    Partners: Male  Other Topics Concern   Not on file  Social History Narrative   Not on file   Social Determinants of Health   Financial Resource Strain: Not on file  Food Insecurity: Not on file  Transportation Needs: Not on file  Physical Activity: Not on file  Stress: Not on file  Social Connections: Not on file  Intimate Partner Violence: Not on file    FAMILY HISTORY:  Family History  Problem Relation Age of Onset   Stroke Mother     Dementia Father    Cancer - Other Sister    Atrial fibrillation Sister    Heart failure Sister    Colon cancer Maternal Grandmother        diagnosed in her 43s    CURRENT MEDICATIONS:  Outpatient Encounter Medications as of 11/06/2022  Medication Sig Note   acetaminophen (TYLENOL) 500 MG tablet Take 1,000 mg by mouth every 6 (six) hours as needed for moderate pain.    albuterol (VENTOLIN HFA) 108 (90 Base) MCG/ACT inhaler Inhale 2 puffs into the lungs every 6 (six) hours as needed for wheezing or shortness of breath.    calcitRIOL (ROCALTROL) 0.25 MCG capsule Take 0.25 mcg by mouth 3 (three) times a week. Monday,Wednesday and Fridays    Cholecalciferol (VITAMIN D-3) 25 MCG (1000 UT) CAPS Take 1,000 Units by mouth daily.    cyclobenzaprine (FLEXERIL) 5 MG tablet Take 5 mg by mouth 3 (three) times daily as needed for muscle spasms.    diclofenac Sodium (VOLTAREN) 1 % GEL  Apply 1 application topically 4 (four) times daily as needed (pain).    ELIQUIS 5 MG TABS tablet TAKE 1 TABLET BY MOUTH TWICE A DAY    flecainide (TAMBOCOR) 50 MG tablet TAKE 1 TABLET BY MOUTH TWICE A DAY    furosemide (LASIX) 40 MG tablet Take 40 mg by mouth as directed. 40 MG IN THE AM AND 20 MG IN THE PM PER THE PT 10/03/22    hydrocortisone (ANUSOL-HC) 2.5 % rectal cream Place 1 application rectally 2 (two) times daily. 05/30/2022: As needed.   losartan (COZAAR) 50 MG tablet TAKE 1 TABLET BY MOUTH EVERY DAY    lubiprostone (AMITIZA) 24 MCG capsule Take 1 capsule (24 mcg total) by mouth in the morning and at bedtime.    octreotide (SANDOSTATIN) 100 MCG/ML SOLN injection Inject 1 mL (100 mcg total) into the skin every 12 (twelve) hours.    ondansetron (ZOFRAN) 4 MG tablet Take 1 tablet (4 mg total) by mouth every 6 (six) hours as needed for nausea.    pantoprazole (PROTONIX) 40 MG tablet Take 1 tablet (40 mg total) by mouth daily.    polyethylene glycol (MIRALAX / GLYCOLAX) 17 g packet Take 17 g by mouth daily as needed for  mild constipation.    potassium chloride SA (KLOR-CON) 20 MEQ tablet Take 20 mEq by mouth in the morning, at noon, and at bedtime.    rosuvastatin (CRESTOR) 10 MG tablet TAKE 1 TABLET BY MOUTH EVERY DAY    vitamin C (ASCORBIC ACID) 250 MG tablet Take 250 mg by mouth daily.    No facility-administered encounter medications on file as of 11/06/2022.    ALLERGIES:  Allergies  Allergen Reactions   Amlodipine     Felt she retained fluid in her chest    Amoxicillin Hives    Did it involve swelling of the face/tongue/throat, SOB, or low BP? No Did it involve sudden or severe rash/hives, skin peeling, or any reaction on the inside of your mouth or nose? No Did you need to seek medical attention at a hospital or doctor's office? No When did it last happen?      10 + years If all above answers are "NO", may proceed with cephalosporin use.    Doxycycline     " sick and weak"   Acetaminophen-Codeine Hives and Rash   Allopurinol Hives and Rash   Tramadol Hives and Rash     PHYSICAL EXAM:  ECOG PERFORMANCE STATUS: 1 - Symptomatic but completely ambulatory  There were no vitals filed for this visit. There were no vitals filed for this visit. Physical Exam Constitutional:      Appearance: Normal appearance. She is obese.  HENT:     Head: Normocephalic and atraumatic.     Mouth/Throat:     Mouth: Mucous membranes are moist.  Eyes:     Extraocular Movements: Extraocular movements intact.     Pupils: Pupils are equal, round, and reactive to light.  Cardiovascular:     Rate and Rhythm: Normal rate and regular rhythm.     Pulses: Normal pulses.     Heart sounds: Normal heart sounds.  Pulmonary:     Effort: Pulmonary effort is normal.     Breath sounds: Normal breath sounds.  Abdominal:     General: Bowel sounds are normal.     Palpations: Abdomen is soft.     Tenderness: There is no abdominal tenderness.  Musculoskeletal:        General: No swelling.  Right lower leg: No edema.      Left lower leg: No edema.  Lymphadenopathy:     Cervical: No cervical adenopathy.  Skin:    General: Skin is warm and dry.  Neurological:     General: No focal deficit present.     Mental Status: She is alert and oriented to person, place, and time.  Psychiatric:        Mood and Affect: Mood normal.        Behavior: Behavior normal.     LABORATORY DATA:  I have reviewed the labs as listed.  CBC    Component Value Date/Time   WBC 5.1 10/23/2022 1313   RBC 3.91 10/23/2022 1313   HGB 11.2 (L) 10/23/2022 1313   HCT 36.5 10/23/2022 1313   PLT 300 10/23/2022 1313   MCV 93.4 10/23/2022 1313   MCH 28.6 10/23/2022 1313   MCHC 30.7 10/23/2022 1313   RDW 14.6 10/23/2022 1313   LYMPHSABS 2.1 09/11/2022 0911   MONOABS 0.5 09/11/2022 0911   EOSABS 0.3 09/11/2022 0911   BASOSABS 0.1 09/11/2022 0911      Latest Ref Rng & Units 10/23/2022    1:15 PM 08/15/2022   10:13 AM 06/23/2022    6:42 AM  CMP  Glucose 70 - 99 mg/dL 93  122  109   BUN 8 - 23 mg/dL '20  23  27   '$ Creatinine 0.44 - 1.00 mg/dL 1.92  1.84  2.19   Sodium 135 - 145 mmol/L 137  138  138   Potassium 3.5 - 5.1 mmol/L 4.5  4.6  4.1   Chloride 98 - 111 mmol/L 101  104  103   CO2 22 - 32 mmol/L '28  28  29   '$ Calcium 8.9 - 10.3 mg/dL 9.0  8.9  9.0     DIAGNOSTIC IMAGING:  I have independently reviewed the relevant imaging and discussed with the patient.  ASSESSMENT & PLAN: 1.  Iron deficiency anemia - Seen at the request of Dr. 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 - She takes Protonix and self administers SQ octreotide twice daily.  She is on Eliquis for atrial fibrillation (cleared by GI). 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 - Hospitalized from 06/21/2022 through 06/23/2022 for symptomatic anemia.  Received 1 unit PRBC and IV Feraheme x500 mg during hospitalization. - Hematology work-up (07/02/2022): Mildly elevated reticulocytes 3.7%.  Normal LDH 140. CBC with Hgb at 9.7/MCV 92.2. Normal folate, copper, B12, MMA, homocystine. Negative SPEP and immunofixation.  Mildly elevated free light chain ratio in keeping with CKD stage IV. - She has intermittent bright red blood per rectum and melena, but this has resolved for the time being ever since small bowel endoscopy at [facility] in October 2023 - Failed to improve on oral iron supplementation, which has since been discontinued. - Currently receiving Retacrit 10,000 units every 2 weeks, but has not required injection since 09/25/2022. - Most recent IV Venofer 300 mg x 2 doses on 09/18/2022 and 10/05/2022 - Labs today (11/06/2022): Hgb 11.5/MCV 92.4, ferritin 64, iron saturation 16% - DIFFERENTIAL DIAGNOSIS: Mixed etiology with  iron deficiency secondary to chronic GI bleeding and decreased erythropoiesis in the setting of CKD stage IV. - PLAN: Recommend additional IV iron with Venofer 300 mg x 3 doses (every other week) - Blood counts have been significantly improved after IV iron repletion and treatment of chronic GI bleeding.  Therefore, we will DECREASE the frequency of her Retacrit 10,000 units to every 4 weeks - Continue GI follow-up - Repeat CBC, CMP, and iron panel with RTC in 3 months with same-day visit  2.  Other history - PMH: CKD stage IV, secondary hyperparathyroidism, chronic diastolic CHF, atrial fibrillation (Eliquis), hypertension, GERD, peripheral arterial disease, mild COPD, and iron deficiency from GI bleeds  - SOCIAL: She is retired and lives at home alone.  She is able to ambulate short distances without difficulty and is independent with ADLs.  She  is a former smoker, smokes 1 PPD for 25 years, quit in 1998.  She denies any alcohol or illicit drug use. - FAMILY: Family history positive for mother with unspecified anemia.  Her sister had breast cancer.  Maternal grandmother also had breast cancer.   PLAN SUMMARY: >> Venofer 300 mg x 3 doses (EVERY OTHER WEEK) >> CBC + Retacrit every 4 weeks >> Labs in 3 months (CBC, CMP, ferritin, iron/TIBC) >> Office visit in 3 months (SAME DAY as labs and injection)   All questions were answered. The patient knows to call the clinic with any problems, questions or concerns.  Medical decision making: Moderate  Time spent on visit: I spent 20 minutes counseling the patient face to face. The total time spent in the appointment was 30 minutes and more than 50% was on counseling.   Harriett Rush, PA-C  11/06/22 2:30 PM

## 2022-11-06 NOTE — Progress Notes (Signed)
Hgb today was 11.5.  Per order parameters no Epogen today.

## 2022-11-06 NOTE — Patient Instructions (Signed)
[facility] Discharge Instructions  You were seen today by Tarri Abernethy PA-C for your anemia.  Your anemia is most secondary to your chronic kidney disease and iron deficiency from your history of GI bleeding.  Your iron levels are improved, but still low.  We will treat you with additional IV iron x3 doses (given every 2 weeks).  We will continue to treat your anemia of chronic kidney disease with Retacrit injections, but we will only give these once a month now (instead of every 2 weeks).  We will recheck labs and see you for office visit in 3 months.  Continue to follow-up with Dr. Theador Hawthorne for your chronic kidney disease, and Dr. Jenetta Downer & [facility] for your chronic GI bleeding.  ** Thank you for trusting me with your healthcare!  I strive to provide all of my patients with quality care at each visit.  If you receive a survey for this visit, I would be so grateful to you for taking the time to provide feedback.  Thank you in advance!  ~ Elaisha Zahniser                   Dr. Derek Jack   &   Tarri Abernethy, PA-C   - - - - - - - - - - - - - - - - - -     Thank you for choosing [facility] at [facility] to provide your oncology and hematology care.  To afford each patient quality time with our provider, please arrive at least 15 minutes before your scheduled appointment time.   If you have a lab appointment with the [facility] please come in thru the Main Entrance and check in at the main information desk.  You need to re-schedule your appointment should you arrive 10 or more minutes late.  We strive to give you quality time with our providers, and arriving late affects you and other patients whose appointments are after yours.  Also, if you no show three or more times for appointments you may be dismissed from the clinic at the providers discretion.     Again, thank you for choosing [facility].  Our hope is that these requests will decrease the amount of time that you wait before being seen by our physicians.       _____________________________________________________________  Should you have questions after your visit to [facility], please contact our office at [phone] and follow the prompts.  Our office hours are 8:00 a.m. and 4:30 p.m. Monday - Friday.  Please note that voicemails left after 4:00 p.m. may not be returned until the following business day.  We are closed weekends and major holidays.  You do have access to a nurse 24-7, just call the main number to the clinic [phone] and do not press any options, hold on the line and a nurse will answer the phone.    For prescription refill requests, have your pharmacy contact our office and allow 72 hours.    Due to Covid, you will need to wear a mask upon entering the hospital. If you do not have a mask, a mask will be given to you at the Main Entrance upon arrival. For doctor visits, patients may have 1 support person age 47 or older with them. For treatment visits, patients can not have anyone with them due to social distancing guidelines and our immunocompromised population.

## 2022-11-07 ENCOUNTER — Ambulatory Visit (INDEPENDENT_AMBULATORY_CARE_PROVIDER_SITE_OTHER): Payer: Medicare HMO

## 2022-11-07 ENCOUNTER — Encounter: Payer: Self-pay | Admitting: Vascular Surgery

## 2022-11-07 ENCOUNTER — Ambulatory Visit: Payer: Medicare HMO

## 2022-11-07 ENCOUNTER — Other Ambulatory Visit: Payer: Self-pay | Admitting: Vascular Surgery

## 2022-11-07 ENCOUNTER — Ambulatory Visit: Payer: Medicare HMO | Admitting: Vascular Surgery

## 2022-11-07 VITALS — BP 163/75 | HR 62 | Temp 98.1°F | Ht 61.81 in | Wt 172.0 lb

## 2022-11-07 DIAGNOSIS — I739 Peripheral vascular disease, unspecified: Secondary | ICD-10-CM | POA: Diagnosis not present

## 2022-11-07 DIAGNOSIS — Z89421 Acquired absence of other right toe(s): Secondary | ICD-10-CM | POA: Diagnosis not present

## 2022-11-07 NOTE — Progress Notes (Signed)
[facility]  Patient name: [name] [name] MRN: [id number] DOB: 11-13-1946 Sex: female  REASON FOR VISIT: Follow-up known peripheral vascular occlusive disease  HPI: [name] [name] is a 76 y.o. female here today for follow-up.  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  She underwent arteriography and angioplasty of a short segment SFA occlusion with Dr. Trula Slade and subsequently a right fifth toe amputation by myself.  She healed her toe amputation without difficulty.  She denies any claudication symptoms and has no new tissue loss.  Past Medical History:  Diagnosis Date   Anemia    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[facility]) 09/11/2022   Chronic kidney disease    Constipation 07/17/2017   GERD (gastroesophageal reflux disease)    Gout    Grade II diastolic dysfunction 56/02/8755   Hyperlipidemia    Hypertension    Hypokalemia    OSA (obstructive sleep apnea) 08/09/2020   PAF (paroxysmal atrial fibrillation) (HCC)    PAF (paroxysmal atrial fibrillation) (HCC)    PUD (peptic ulcer disease)    Syncope    Neurocardiogenic    Family History  Problem Relation Age of Onset   Stroke Mother    Dementia Father    Cancer - Other Sister    Atrial fibrillation Sister    Heart failure Sister    Colon cancer Maternal Grandmother        diagnosed in her 5s    SOCIAL HISTORY: Social History   Tobacco Use   Smoking status: Former    Packs/day: 1.00    Years: 20.00    Total pack years: 20.00    Types: Cigarettes    Start date: 03/16/1964    Quit date: 10/07/1997    Years since quitting: 25.1   Smokeless tobacco: Never  Substance Use Topics   Alcohol use: No    Alcohol/week: 0.0 standard drinks of alcohol    Allergies  Allergen Reactions   Acetaminophen-Codeine Hives, Rash and Itching   Amlodipine     Felt she retained fluid in her chest    Amoxicillin Hives    Did it  involve swelling of the face/tongue/throat, SOB, or low BP? No Did it involve sudden or severe rash/hives, skin peeling, or any reaction on the inside of your mouth or nose? No Did you need to seek medical attention at a hospital or doctor's office? No When did it last happen?      10 + years If all above answers are "NO", may proceed with cephalosporin use.    Doxycycline     " sick and weak"   Allopurinol Hives and Rash   Tramadol Hives and Rash    Current Outpatient Medications  Medication Sig Dispense Refill   acetaminophen (TYLENOL) 500 MG tablet Take 1,000 mg by mouth every 6 (six) hours as needed for moderate pain.     albuterol (VENTOLIN HFA) 108 (90 Base) MCG/ACT inhaler Inhale 2 puffs into the lungs every 6 (six) hours as needed for wheezing or shortness of breath. 18 g 1   apixaban (ELIQUIS) 5 MG TABS tablet Take 1 tablet by mouth 2 (two) times daily.     calcitRIOL (ROCALTROL) 0.25 MCG capsule Take 0.25 mcg by mouth 3 (three) times a week. Monday,Wednesday and Fridays     Cholecalciferol (VITAMIN D-3) 25 MCG (1000 UT) CAPS Take 1,000 Units by mouth daily.     cyclobenzaprine (FLEXERIL) 5 MG  tablet Take 5 mg by mouth 3 (three) times daily as needed for muscle spasms.     diclofenac Sodium (VOLTAREN) 1 % GEL Apply 1 application topically 4 (four) times daily as needed (pain).     ELIQUIS 5 MG TABS tablet TAKE 1 TABLET BY MOUTH TWICE A DAY 180 tablet 1   flecainide (TAMBOCOR) 50 MG tablet TAKE 1 TABLET BY MOUTH TWICE A DAY 60 tablet 5   furosemide (LASIX) 40 MG tablet Take 40 mg by mouth as directed. 40 MG IN THE AM AND 20 MG IN THE PM PER THE PT 10/03/22     hydrocortisone (ANUSOL-HC) 2.5 % rectal cream Place 1 application rectally 2 (two) times daily. 30 g 1   losartan (COZAAR) 50 MG tablet TAKE 1 TABLET BY MOUTH EVERY DAY 90 tablet 3   lubiprostone (AMITIZA) 24 MCG capsule Take 1 capsule (24 mcg total) by mouth in the morning and at bedtime. 180 capsule 3   octreotide  (SANDOSTATIN) 100 MCG/ML SOLN injection Inject 1 mL (100 mcg total) into the skin every 12 (twelve) hours. 60 mL 11   ondansetron (ZOFRAN) 4 MG tablet Take 1 tablet (4 mg total) by mouth every 6 (six) hours as needed for nausea. 20 tablet 0   pantoprazole (PROTONIX) 40 MG tablet Take 1 tablet (40 mg total) by mouth daily. 90 tablet 3   polyethylene glycol (MIRALAX / GLYCOLAX) 17 g packet Take 17 g by mouth daily as needed for mild constipation. 14 each 0   potassium chloride SA (KLOR-CON) 20 MEQ tablet Take 20 mEq by mouth in the morning, at noon, and at bedtime.     rosuvastatin (CRESTOR) 10 MG tablet TAKE 1 TABLET BY MOUTH EVERY DAY 30 tablet 0   vitamin C (ASCORBIC ACID) 250 MG tablet Take 250 mg by mouth daily.     No current facility-administered medications for this visit.    REVIEW OF SYSTEMS:  '[X]'$  denotes positive finding, '[ ]'$  denotes negative finding Cardiac  Comments:  Chest pain or chest pressure:    Shortness of breath upon exertion:    Short of breath when lying flat:    Irregular heart rhythm:        Vascular    Pain in calf, thigh, or hip brought on by ambulation:    Pain in feet at night that wakes you up from your sleep:     Blood clot in your veins:    Leg swelling:           PHYSICAL EXAM: Vitals:   11/07/22 1530  BP: (!) 163/75  Pulse: 62  Temp: 98.1 F (36.7 C)  SpO2: 98%  Weight: 172 lb (78 kg)  Height: 5' 1.81" (1.57 m)    GENERAL: The patient is a well-nourished female, in no acute distress. The vital signs are documented above. CARDIOVASCULAR: Palpable femoral pulses bilaterally.  Absent popliteal and distal pulses bilaterally PULMONARY: There is good air exchange  MUSCULOSKELETAL: There are no major deformities or cyanosis. NEUROLOGIC: No focal weakness or paresthesias are detected. SKIN: There are no ulcers or rashes noted. PSYCHIATRIC: The patient has a normal affect.  DATA:  She has medial calcification lower extremities bilaterally making  ankle arm indices unreliable.  She has monophasic waveforms at the pedal level bilaterally  Right leg arterial duplex suggest short segment occlusion in her mid SFA.  MEDICAL ISSUES: Known moderate bilateral lower extremity arterial insufficiency.  No rest pain or tissue loss.  She will continue her  usual activities and notify should she develop any progressive symptoms or tissue loss.  Otherwise we will see her again in 1 year with repeat noninvasive studies    Rosetta Posner, MD FACS [facility] [phone]  Note: Portions of this report may have been transcribed using voice recognition software.  Every effort has been made to ensure accuracy; however, inadvertent computerized transcription errors may still be present.

## 2022-11-08 ENCOUNTER — Inpatient Hospital Stay: Payer: Medicare HMO

## 2022-11-08 VITALS — BP 148/61 | HR 52 | Temp 96.2°F | Resp 18

## 2022-11-08 DIAGNOSIS — D631 Anemia in chronic kidney disease: Secondary | ICD-10-CM

## 2022-11-08 DIAGNOSIS — D500 Iron deficiency anemia secondary to blood loss (chronic): Secondary | ICD-10-CM

## 2022-11-08 MED ORDER — LORATADINE 10 MG PO TABS
10.0000 mg | ORAL_TABLET | Freq: Once | ORAL | Status: AC
  Administered 2022-11-08: 10 mg via ORAL
  Filled 2022-11-08: qty 1

## 2022-11-08 MED ORDER — SODIUM CHLORIDE 0.9 % IV SOLN: Freq: Once | INTRAVENOUS | Status: AC

## 2022-11-08 MED ORDER — SODIUM CHLORIDE 0.9 % IV SOLN
300.0000 mg | Freq: Once | INTRAVENOUS | Status: AC
  Administered 2022-11-08: 300 mg via INTRAVENOUS
  Filled 2022-11-08: qty 300

## 2022-11-08 MED ORDER — ACETAMINOPHEN 325 MG PO TABS
650.0000 mg | ORAL_TABLET | Freq: Once | ORAL | Status: AC
  Administered 2022-11-08: 650 mg via ORAL
  Filled 2022-11-08: qty 2

## 2022-11-08 NOTE — Patient Instructions (Signed)
[facility]  Discharge Instructions: Thank you for choosing [facility] to provide your oncology and hematology care.  If you have a lab appointment with the [facility], please come in thru the Main Entrance and check in at the main information desk.  Wear comfortable clothing and clothing appropriate for easy access to any Portacath or PICC line.   We strive to give you quality time with your provider. You may need to reschedule your appointment if you arrive late (15 or more minutes).  Arriving late affects you and other patients whose appointments are after yours.  Also, if you miss three or more appointments without notifying the office, you may be dismissed from the clinic at the provider's discretion.      For prescription refill requests, have your pharmacy contact our office and allow 72 hours for refills to be completed.    Today you received the following, Venofer iron infusion   To help prevent nausea and vomiting after your treatment, we encourage you to take your nausea medication as directed.  BELOW ARE SYMPTOMS THAT SHOULD BE REPORTED IMMEDIATELY: *FEVER GREATER THAN 100.4 F (38 C) OR HIGHER *CHILLS OR SWEATING *NAUSEA AND VOMITING THAT IS NOT CONTROLLED WITH YOUR NAUSEA MEDICATION *UNUSUAL SHORTNESS OF BREATH *UNUSUAL BRUISING OR BLEEDING *URINARY PROBLEMS (pain or burning when urinating, or frequent urination) *BOWEL PROBLEMS (unusual diarrhea, constipation, pain near the anus) TENDERNESS IN MOUTH AND THROAT WITH OR WITHOUT PRESENCE OF ULCERS (sore throat, sores in mouth, or a toothache) UNUSUAL RASH, SWELLING OR PAIN  UNUSUAL VAGINAL DISCHARGE OR ITCHING   Items with * indicate a potential emergency and should be followed up as soon as possible or go to the Emergency Department if any problems should occur.  Please show the CHEMOTHERAPY ALERT CARD or IMMUNOTHERAPY ALERT CARD at check-in to the Emergency Department and triage  nurse.  Should you have questions after your visit or need to cancel or reschedule your appointment, please contact [facility] [phone]  and follow the prompts.  Office hours are 8:00 a.m. to 4:30 p.m. Monday - Friday. Please note that voicemails left after 4:00 p.m. may not be returned until the following business day.  We are closed weekends and major holidays. You have access to a nurse at all times for urgent questions. Please call the main number to the clinic [phone] and follow the prompts.  For any non-urgent questions, you may also contact your provider using MyChart. We now offer e-Visits for anyone 69 and older to request care online for non-urgent symptoms. For details visit [email or web address].   Also download the MyChart app! Go to the app store, search "MyChart", open the app, select [facility], and log in with your MyChart username and password.  Masks are optional in the cancer centers. If you would like for your care team to wear a mask while they are taking care of you, please let them know. You may have one support person who is at least 76 years old accompany you for your appointments.

## 2022-11-08 NOTE — Progress Notes (Signed)
Iron infusion given per orders. Patient tolerated it well without problems. Vitals stable and discharged home from clinic ambulatory. Follow up as scheduled.  

## 2022-11-26 ENCOUNTER — Inpatient Hospital Stay: Payer: Medicare HMO | Attending: Hematology

## 2022-11-26 VITALS — BP 135/58 | HR 49 | Temp 97.2°F | Resp 16

## 2022-11-26 DIAGNOSIS — D631 Anemia in chronic kidney disease: Secondary | ICD-10-CM

## 2022-11-26 DIAGNOSIS — D500 Iron deficiency anemia secondary to blood loss (chronic): Secondary | ICD-10-CM | POA: Diagnosis present

## 2022-11-26 DIAGNOSIS — Z79899 Other long term (current) drug therapy: Secondary | ICD-10-CM | POA: Diagnosis not present

## 2022-11-26 MED ORDER — SODIUM CHLORIDE 0.9 % IV SOLN: Freq: Once | INTRAVENOUS | Status: AC

## 2022-11-26 MED ORDER — SODIUM CHLORIDE 0.9 % IV SOLN
300.0000 mg | Freq: Once | INTRAVENOUS | Status: AC
  Administered 2022-11-26: 300 mg via INTRAVENOUS
  Filled 2022-11-26: qty 300

## 2022-11-26 NOTE — Patient Instructions (Signed)
[facility]  Discharge Instructions: Thank you for choosing [facility] to provide your oncology and hematology care.  If you have a lab appointment with the Cancer Center, please come in thru the Main Entrance and check in at the main information desk.  Wear comfortable clothing and clothing appropriate for easy access to any Portacath or PICC line.   We strive to give you quality time with your provider. You may need to reschedule your appointment if you arrive late (15 or more minutes).  Arriving late affects you and other patients whose appointments are after yours.  Also, if you miss three or more appointments without notifying the office, you may be dismissed from the clinic at the provider's discretion.      For prescription refill requests, have your pharmacy contact our office and allow 72 hours for refills to be completed.    Today you received Venofer IV iron infusion.     BELOW ARE SYMPTOMS THAT SHOULD BE REPORTED IMMEDIATELY: *FEVER GREATER THAN 100.4 F (38 C) OR HIGHER *CHILLS OR SWEATING *NAUSEA AND VOMITING THAT IS NOT CONTROLLED WITH YOUR NAUSEA MEDICATION *UNUSUAL SHORTNESS OF BREATH *UNUSUAL BRUISING OR BLEEDING *URINARY PROBLEMS (pain or burning when urinating, or frequent urination) *BOWEL PROBLEMS (unusual diarrhea, constipation, pain near the anus) TENDERNESS IN MOUTH AND THROAT WITH OR WITHOUT PRESENCE OF ULCERS (sore throat, sores in mouth, or a toothache) UNUSUAL RASH, SWELLING OR PAIN  UNUSUAL VAGINAL DISCHARGE OR ITCHING   Items with * indicate a potential emergency and should be followed up as soon as possible or go to the Emergency Department if any problems should occur.  Please show the CHEMOTHERAPY ALERT CARD or IMMUNOTHERAPY ALERT CARD at check-in to the Emergency Department and triage nurse.  Should you have questions after your visit or need to cancel or reschedule your appointment, please contact [facility] [phone]  and follow the prompts.  Office hours are 8:00 a.m. to 4:30 p.m. Monday - Friday. Please note that voicemails left after 4:00 p.m. may not be returned until the following business day.  We are closed weekends and major holidays. You have access to a nurse at all times for urgent questions. Please call the main number to the clinic [phone] and follow the prompts.  For any non-urgent questions, you may also contact your provider using MyChart. We now offer e-Visits for anyone 18 and older to request care online for non-urgent symptoms. For details visit [email or web address].   Also download the MyChart app! Go to the app store, search "MyChart", open the app, select [facility], and log in with your MyChart username and password.  Masks are optional in the cancer centers. If you would like for your care team to wear a mask while they are taking care of you, please let them know. You may have one support person who is at least 76 years old accompany you for your appointments.  

## 2022-11-26 NOTE — Progress Notes (Signed)
Pt presents today for Venofer IV iron infusion per provider's order. Vital signs stable and pt voiced no new complaints at this time  Pt took pre-meds Tylenol and Zyrtec at home prior to arrival. Peripheral IV started with good blood return pre and post infusion.  Venofer 300 mg given today per MD orders. Tolerated infusion without adverse affects. Vital signs stable. No complaints at this time. Discharged from clinic via walker in stable condition. Alert and oriented x 3. F/U with [facility] as scheduled.

## 2022-12-01 NOTE — Progress Notes (Unsigned)
GI Office Note    Referring Provider: Andres Shad, * Primary Care Physician:  Derek Jack, MD Primary Gastroenterologist: Elon Alas. Abbey Chatters, DO  Date:  12/01/2022  ID:  [name] [name], DOB 1946-08-14, MRN [id number]   Chief Complaint   No chief complaint on file.   History of Present Illness  [name] [name] is a 76 y.o. 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  Patient has had multiple hospitalizations for acute blood loss anemia secondary to small bowel AVMs.  She had EGD in December 2021 which was relatively unremarkable and has subsequent small bowel capsule endoscopy revealing multiple small bowel AVMs and underwent enteroscopy with APC of multiple AVMs on 11/27/20.  She subsequently suffered a syncopal episode at home on 12/05/2020 for anemia and was started on subcutaneous octreotide during that hospitalization.  In April 2022 she again had worsening anemia and presented to the hospital for blood transfusion and was referred to [facility] and underwent double-balloon enteroscopy on 05/19/2021 where she had 16 angioectasias in the duodenum, 6 angiectasia's in the proximal jejunum, and 5 angiectasias mid to distal jejunum all of which were ablated with APC.    Hospitalization 06/21/22-06/23/22.  She presented with weakness and worsening dyspnea on exertion and was found to have a hemoglobin of 7.8.  Iron panel with iron 21, TIBC 441, saturation 5%, and ferritin 12.  She was given 1 unit of PRBC.  She reportedly had been following with hematology and was going to discuss IV iron infusions.  She regularly has her hemoglobin checked every 2 weeks.  She was seen in consult for her anemia and history of small bowel AVMs by our team.  She mentioned intermittent blood in her stools due to hemorrhoids and last noted melena about a month prior to presentation.  Given recurrent anemia and  possible bleeding the decision was made to refer her back to [facility] for double-balloon enteroscopy evaluation/intervention.  Patient agreed to this and was treated conservatively with blood transfusion/iron infusion.  She was advised to continue octreotide injections.   Seen by hematology on 07/02/2022.  She reportedly was still symptomatic with fatigue, ice pica, lightheadedness, palpitations, and some dyspnea on exertion.  She failed to improve with oral iron supplementation therefore it was discontinued and the plan was for IV Venofer 400 mg for 2 doses and Retacrit 10,000 units every 2 weeks starting on 07/12/2022.  Other lab work was ordered including copper, folate, homocystine, B12, reticulocytes, methylmalonic acid, LDH, immunofixation, free light chains, and SPEP.  She was to repeat CBC and iron panel and follow-up in 2 months.   Labs 07/02/2022: Hemoglobin 9.7, MCV 92.2, folate 18.9, LDH 140, reticulocyte percentage 3.7, absolute retake count 132.8, immature retake fractionation 29.9%, copper normal at 107, methylmalonic acid normal at 255, B12 505.   Last seen in the office 07/30/22. No reports of melena or hematochezia. Energy had improved, recently received iron infusions. Denied N/V, reflux, dysphagia, worsening SOB. Having hgb checked every 2 weeks, Due to see [facility] in 1 month. Using anusol intermittently for perineal discomfort. Constipation well controlled with Amitiza 83mg twice daily. Advised to use zinc based cream to perineum. Continue octreotide and regular follow up with hematology.   Small bowel enteroscopy performed by [facility] 10/08/2022: -3 medium angioectasias, along with bleeding found in the gastric body s/p APC -40 angiectasias, several bleeding found in the entire duodenum s/p  APC -21 angiectasias, several bleeding in proximal jejunum s/p APC -Recommended to continue monitoring H/H and iron stores, continue octreotide.   Most recent labs 11/06/2022: Hemoglobin 11.5, MCV 90.4,  platelets 273, iron 57, saturation 16%, ferritin 64, creatinine 1.894 normal LFTs.   Today: Constipation -   Anemia -melena?  Fatigue?  Current Outpatient Medications  Medication Sig Dispense Refill   acetaminophen (TYLENOL) 500 MG tablet Take 1,000 mg by mouth every 6 (six) hours as needed for moderate pain.     albuterol (VENTOLIN HFA) 108 (90 Base) MCG/ACT inhaler Inhale 2 puffs into the lungs every 6 (six) hours as needed for wheezing or shortness of breath. 18 g 1   apixaban (ELIQUIS) 5 MG TABS tablet Take 1 tablet by mouth 2 (two) times daily.     calcitRIOL (ROCALTROL) 0.25 MCG capsule Take 0.25 mcg by mouth 3 (three) times a week. Monday,Wednesday and Fridays     Cholecalciferol (VITAMIN D-3) 25 MCG (1000 UT) CAPS Take 1,000 Units by mouth daily.     cyclobenzaprine (FLEXERIL) 5 MG tablet Take 5 mg by mouth 3 (three) times daily as needed for muscle spasms.     diclofenac Sodium (VOLTAREN) 1 % GEL Apply 1 application topically 4 (four) times daily as needed (pain).     ELIQUIS 5 MG TABS tablet TAKE 1 TABLET BY MOUTH TWICE A DAY 180 tablet 1   flecainide (TAMBOCOR) 50 MG tablet TAKE 1 TABLET BY MOUTH TWICE A DAY 60 tablet 5   furosemide (LASIX) 40 MG tablet Take 40 mg by mouth as directed. 40 MG IN THE AM AND 20 MG IN THE PM PER THE PT 10/03/22     hydrocortisone (ANUSOL-HC) 2.5 % rectal cream Place 1 application rectally 2 (two) times daily. 30 g 1   losartan (COZAAR) 50 MG tablet TAKE 1 TABLET BY MOUTH EVERY DAY 90 tablet 3   lubiprostone (AMITIZA) 24 MCG capsule Take 1 capsule (24 mcg total) by mouth in the morning and at bedtime. 180 capsule 3   octreotide (SANDOSTATIN) 100 MCG/ML SOLN injection Inject 1 mL (100 mcg total) into the skin every 12 (twelve) hours. 60 mL 11   ondansetron (ZOFRAN) 4 MG tablet Take 1 tablet (4 mg total) by mouth every 6 (six) hours as needed for nausea. 20 tablet 0   pantoprazole (PROTONIX) 40 MG tablet Take 1 tablet (40 mg total) by mouth daily. 90  tablet 3   polyethylene glycol (MIRALAX / GLYCOLAX) 17 g packet Take 17 g by mouth daily as needed for mild constipation. 14 each 0   potassium chloride SA (KLOR-CON) 20 MEQ tablet Take 20 mEq by mouth in the morning, at noon, and at bedtime.     rosuvastatin (CRESTOR) 10 MG tablet TAKE 1 TABLET BY MOUTH EVERY DAY 30 tablet 0   vitamin C (ASCORBIC ACID) 250 MG tablet Take 250 mg by mouth daily.     No current facility-administered medications for this visit.    Past Medical History:  Diagnosis Date   Anemia    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[place]) 09/11/2022   Chronic kidney disease    Constipation 07/17/2017   GERD (gastroesophageal reflux disease)    Gout    Grade II diastolic dysfunction 74/0/8144   Hyperlipidemia    Hypertension    Hypokalemia    OSA (obstructive sleep apnea) 08/09/2020   PAF (paroxysmal atrial fibrillation) (HCC)    PAF (paroxysmal atrial fibrillation) (HCC)    PUD (peptic ulcer  disease)    Syncope    Neurocardiogenic    Past Surgical History:  Procedure Laterality Date   ABDOMINAL AORTOGRAM W/LOWER EXTREMITY Right 12/13/2020   Procedure: ABDOMINAL AORTOGRAM W/LOWER EXTREMITY;  Surgeon: Serafina Mitchell, MD;  Location: [place] [facility];  Service: Cardiovascular;  Laterality: Right;   ABDOMINAL HYSTERECTOMY     AMPUTATION Right 12/14/2020   Procedure: Right fifth toe amputation;  Surgeon: Rosetta Posner, MD;  Location: [facility] OR;  Service: Vascular;  Laterality: Right;   CATARACT EXTRACTION Bilateral    COLONOSCOPY N/A 10/07/2014   Dr. Oneida Alar: redundant left colon, moderate sized external hemorrhoids    COLONOSCOPY N/A 03/11/2020   Surgeon: Danie Binder, MD;  9 polyps, majority of which were tubular adenomas, nodular mucosa in the anus s/p biopsy which was benign.   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    ESOPHAGOGASTRODUODENOSCOPY (EGD) WITH PROPOFOL N/A 11/25/2020    Surgeon: Daneil Dolin, MD; Normal, s/p capsule deployment   [place] N/A 11/25/2020    Surgeon: Daneil Dolin, MD; 4/nonbleeding AVMs in the proximal small bowel large fresh blood, 2 bleeding lymphangiectasis in SB.    HOT HEMOSTASIS  11/27/2020   Procedure: HOT HEMOSTASIS (ARGON PLASMA COAGULATION/BICAP);  Surgeon: Montez Morita, Quillian Quince, MD;  Location: [facility];  Service: Gastroenterology;;   LIPOMA RESECTION     PERIPHERAL VASCULAR INTERVENTION Right 12/13/2020   Procedure: PERIPHERAL VASCULAR INTERVENTION;  Surgeon: Serafina Mitchell, MD;  Location: [place] [facility];  Service: Cardiovascular;  Laterality: Right;  SFA/PT/AT   POLYPECTOMY  03/11/2020   Procedure: POLYPECTOMY;  Surgeon: Danie Binder, MD;  Location: [facility];  Service: Endoscopy;;  cecal, transverse, descending, sigmoid   TEE WITHOUT CARDIOVERSION N/A 05/10/2021   Procedure: TRANSESOPHAGEAL ECHOCARDIOGRAM (TEE) WATCHMEN EVALUATION;  Surgeon: Josue Hector, MD;  Location[facility] ENDOSCOPY;  Service: Cardiovascular;  Laterality: N/A;    Family History  Problem Relation Age of Onset   Stroke Mother    Dementia Father    Cancer - Other Sister    Atrial fibrillation Sister    Heart failure Sister    Colon cancer Maternal Grandmother        diagnosed in her 25s    Allergies as of 12/03/2022 - Review Complete 11/26/2022  Allergen Reaction Noted   Acetaminophen-codeine Hives, Itching, and Rash 09/16/2020   Amlodipine  08/03/2020   Amoxicillin Hives 03/04/2020   Doxycycline  11/23/2020   Allopurinol Hives and Rash    Tramadol Hives and Rash     Social History   Socioeconomic History   Marital status: Single    Spouse name: Not on file   Number of children: Not on file   Years of education: Not on file   Highest education level: Not on file  Occupational History   Occupation: Retired  Tobacco Use   Smoking status:  Former    Packs/day: 1.00    Years: 20.00    Total pack years: 20.00    Types: Cigarettes    Start date: 03/16/1964    Quit date: 10/07/1997    Years since quitting: 25.1   Smokeless tobacco: Never  Vaping Use   Vaping Use: Never used  Substance and Sexual Activity   Alcohol use: No    Alcohol/week: 0.0 standard drinks of alcohol   Drug use: No  Sexual activity: Yes    Partners: Male  Other Topics Concern   Not on file  Social History Narrative   Not on file   Social Determinants of Health   Financial Resource Strain: Not on file  Food Insecurity: Not on file  Transportation Needs: Not on file  Physical Activity: Not on file  Stress: Not on file  Social Connections: Not on file     Review of Systems   Gen: Denies fever, chills, anorexia. Denies fatigue, weakness, weight loss.  CV: Denies chest pain, palpitations, syncope, peripheral edema, and claudication. Resp: Denies dyspnea at rest, cough, wheezing, coughing up blood, and pleurisy. GI: See HPI Derm: Denies rash, itching, dry skin Psych: Denies depression, anxiety, memory loss, confusion. No homicidal or suicidal ideation.  Heme: Denies bruising, bleeding, and enlarged lymph nodes.   Physical Exam   There were no vitals taken for this visit.  General:   Alert and oriented. No distress noted. Pleasant and cooperative.  Head:  Normocephalic and atraumatic. Eyes:  Conjuctiva clear without scleral icterus. Mouth:  Oral mucosa pink and moist. Good dentition. No lesions. Lungs:  Clear to auscultation bilaterally. No wheezes, rales, or rhonchi. No distress.  Heart:  S1, S2 present without murmurs appreciated.  Abdomen:  +BS, soft, non-tender and non-distended. No rebound or guarding. No HSM or masses noted. Rectal: *** Msk:  Symmetrical without gross deformities. Normal posture. Extremities:  Without edema. Neurologic:  Alert and  oriented x4 Psych:  Alert and cooperative. Normal mood and affect.   Assessment   [name] [name] is a 76 y.o. 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  Chronic anemia, history of small bowel AVMs: Multifactorial given small bowel AVMs and chronic kidney disease in the setting of chronic anticoagulation. Follows closely with hematology and receives iron infusions regularly. Recently underwent enteroscopy at [facility] 10/08/22 noting 64 total AVMs treated with APC therapy (3 in gastric body, 40 throughout duodenum, 21 in proximal jejunum). ***  IBS-constipation: Fairly well controlled on Amitiza 24 mcg twice daily. ***   PLAN   *** Continue octreotide 100 mcg subcutaneous twice daily Continue Amitiza 24 mcg twice daily Avoid NSAIDs Continue to follow with hematology     Venetia Night, MSN, FNP-BC, AGACNP-BC [facility]

## 2022-12-03 ENCOUNTER — Ambulatory Visit (INDEPENDENT_AMBULATORY_CARE_PROVIDER_SITE_OTHER): Payer: Medicare HMO | Admitting: Gastroenterology

## 2022-12-03 ENCOUNTER — Encounter: Payer: Self-pay | Admitting: Gastroenterology

## 2022-12-03 VITALS — BP 127/67 | HR 51 | Temp 98.2°F | Ht 62.0 in | Wt 176.6 lb

## 2022-12-03 DIAGNOSIS — K5521 Angiodysplasia of colon with hemorrhage: Secondary | ICD-10-CM | POA: Diagnosis not present

## 2022-12-03 DIAGNOSIS — K581 Irritable bowel syndrome with constipation: Secondary | ICD-10-CM | POA: Diagnosis not present

## 2022-12-03 DIAGNOSIS — D509 Iron deficiency anemia, unspecified: Secondary | ICD-10-CM

## 2022-12-03 NOTE — Patient Instructions (Addendum)
Continue octreotide 100 mcg subcutaneous twice daily.  Continue to follow with hematology with regular blood checks. Continue to avoid NSAIDs.  Continue Amitiza 24 mcg twice daily with food for your constipation.  Continue to ensure you are adequately hydrated.  Will plan to see you in 6 months for follow-up, or sooner if needed if you begin to experience melena/black stools or bright red blood in your stools.  I hope you have a wonderful Christmas and a happy new year  It was a pleasure to see you today. I want to create trusting relationships with patients. If you receive a survey regarding your visit,  I greatly appreciate you taking time to fill this out on paper or through your MyChart. I value your feedback.  Venetia Night, MSN, FNP-BC, AGACNP-BC [facility]

## 2022-12-04 ENCOUNTER — Inpatient Hospital Stay: Payer: Medicare HMO

## 2022-12-04 DIAGNOSIS — D500 Iron deficiency anemia secondary to blood loss (chronic): Secondary | ICD-10-CM | POA: Diagnosis not present

## 2022-12-04 DIAGNOSIS — D631 Anemia in chronic kidney disease: Secondary | ICD-10-CM

## 2022-12-04 LAB — CBC
HCT: 39.6 % (ref 36.0–46.0)
Hemoglobin: 12 g/dL (ref 12.0–15.0)
MCH: 28.2 pg (ref 26.0–34.0)
MCHC: 30.3 g/dL (ref 30.0–36.0)
MCV: 93 fL (ref 80.0–100.0)
Platelets: 264 10*3/uL (ref 150–400)
RBC: 4.26 MIL/uL (ref 3.87–5.11)
RDW: 14.5 % (ref 11.5–15.5)
WBC: 4.7 10*3/uL (ref 4.0–10.5)
nRBC: 0 % (ref 0.0–0.2)

## 2022-12-04 NOTE — Progress Notes (Signed)
No Epogen today per per order parameters due to Hgb of 12.0.

## 2022-12-10 ENCOUNTER — Inpatient Hospital Stay: Payer: Medicare HMO

## 2022-12-10 VITALS — BP 143/51 | HR 55 | Temp 96.7°F | Resp 18

## 2022-12-10 DIAGNOSIS — D500 Iron deficiency anemia secondary to blood loss (chronic): Secondary | ICD-10-CM

## 2022-12-10 DIAGNOSIS — D631 Anemia in chronic kidney disease: Secondary | ICD-10-CM

## 2022-12-10 MED ORDER — SODIUM CHLORIDE 0.9 % IV SOLN: Freq: Once | INTRAVENOUS | Status: AC

## 2022-12-10 MED ORDER — LORATADINE 10 MG PO TABS: 10.0000 mg | ORAL_TABLET | Freq: Once | ORAL | Status: DC

## 2022-12-10 MED ORDER — ACETAMINOPHEN 325 MG PO TABS: 650.0000 mg | ORAL_TABLET | Freq: Once | ORAL | Status: DC

## 2022-12-10 MED ORDER — SODIUM CHLORIDE 0.9 % IV SOLN
300.0000 mg | Freq: Once | INTRAVENOUS | Status: AC
  Administered 2022-12-10: 300 mg via INTRAVENOUS
  Filled 2022-12-10: qty 300

## 2022-12-10 NOTE — Progress Notes (Signed)
Patient presents today for iron infusion.  Patient is in satisfactory condition with no new complaints voiced.  Vital signs are stable.  Tylenol and Claritin were taken at 0930 at home prior to visit.  We will proceed with infusion per provider orders.   Patient tolerated infusion well with no complaints voiced.  Patient left ambulatory in stable condition.  Vital signs stable at discharge.  Follow up as scheduled.

## 2022-12-10 NOTE — Patient Instructions (Signed)
[facility]  Discharge Instructions: Thank you for choosing [facility] to provide your oncology and hematology care.  If you have a lab appointment with the Cancer Center, please come in thru the Main Entrance and check in at the main information desk.  Wear comfortable clothing and clothing appropriate for easy access to any Portacath or PICC line.   We strive to give you quality time with your provider. You may need to reschedule your appointment if you arrive late (15 or more minutes).  Arriving late affects you and other patients whose appointments are after yours.  Also, if you miss three or more appointments without notifying the office, you may be dismissed from the clinic at the provider's discretion.      For prescription refill requests, have your pharmacy contact our office and allow 72 hours for refills to be completed.     To help prevent nausea and vomiting after your treatment, we encourage you to take your nausea medication as directed.  BELOW ARE SYMPTOMS THAT SHOULD BE REPORTED IMMEDIATELY: *FEVER GREATER THAN 100.4 F (38 C) OR HIGHER *CHILLS OR SWEATING *NAUSEA AND VOMITING THAT IS NOT CONTROLLED WITH YOUR NAUSEA MEDICATION *UNUSUAL SHORTNESS OF BREATH *UNUSUAL BRUISING OR BLEEDING *URINARY PROBLEMS (pain or burning when urinating, or frequent urination) *BOWEL PROBLEMS (unusual diarrhea, constipation, pain near the anus) TENDERNESS IN MOUTH AND THROAT WITH OR WITHOUT PRESENCE OF ULCERS (sore throat, sores in mouth, or a toothache) UNUSUAL RASH, SWELLING OR PAIN  UNUSUAL VAGINAL DISCHARGE OR ITCHING   Items with * indicate a potential emergency and should be followed up as soon as possible or go to the Emergency Department if any problems should occur.  Please show the CHEMOTHERAPY ALERT CARD or IMMUNOTHERAPY ALERT CARD at check-in to the Emergency Department and triage nurse.  Should you have questions after your visit or need to  cancel or reschedule your appointment, please contact [facility] [place] [facility] [phone]  and follow the prompts.  Office hours are 8:00 a.m. to 4:30 p.m. Monday - Friday. Please note that voicemails left after 4:00 p.m. may not be returned until the following business day.  We are closed weekends and major holidays. You have access to a nurse at all times for urgent questions. Please call the main number to the clinic [phone] and follow the prompts.  For any non-urgent questions, you may also contact your provider using MyChart. We now offer e-Visits for anyone 18 and older to request care online for non-urgent symptoms. For details visit [email or web address].   Also download the MyChart app! Go to the app store, search "MyChart", open the app, select [facility], and log in with your MyChart username and password.  Masks are optional in the cancer centers. If you would like for your care team to wear a mask while they are taking care of you, please let them know. You may have one support person who is at least 76 years old accompany you for your appointments.  

## 2022-12-12 ENCOUNTER — Other Ambulatory Visit: Payer: Self-pay | Admitting: Internal Medicine

## 2022-12-12 DIAGNOSIS — K5900 Constipation, unspecified: Secondary | ICD-10-CM

## 2022-12-12 DIAGNOSIS — K625 Hemorrhage of anus and rectum: Secondary | ICD-10-CM

## 2022-12-12 DIAGNOSIS — I480 Paroxysmal atrial fibrillation: Secondary | ICD-10-CM

## 2022-12-13 ENCOUNTER — Encounter (HOSPITAL_COMMUNITY): Payer: Self-pay | Admitting: Emergency Medicine

## 2022-12-13 ENCOUNTER — Emergency Department (HOSPITAL_COMMUNITY)
Admission: EM | Admit: 2022-12-13 | Discharge: 2022-12-13 | Disposition: A | Discharge status: Home / Self Care | Payer: Medicare HMO | Attending: Emergency Medicine | Admitting: Emergency Medicine

## 2022-12-13 ENCOUNTER — Emergency Department (HOSPITAL_COMMUNITY): Payer: Medicare HMO

## 2022-12-13 ENCOUNTER — Other Ambulatory Visit: Payer: Self-pay

## 2022-12-13 ENCOUNTER — Telehealth: Payer: Self-pay | Admitting: Orthopedic Surgery

## 2022-12-13 DIAGNOSIS — N189 Chronic kidney disease, unspecified: Secondary | ICD-10-CM | POA: Diagnosis not present

## 2022-12-13 DIAGNOSIS — I129 Hypertensive chronic kidney disease with stage 1 through stage 4 chronic kidney disease, or unspecified chronic kidney disease: Secondary | ICD-10-CM | POA: Insufficient documentation

## 2022-12-13 DIAGNOSIS — Z7901 Long term (current) use of anticoagulants: Secondary | ICD-10-CM | POA: Diagnosis not present

## 2022-12-13 DIAGNOSIS — M1990 Unspecified osteoarthritis, unspecified site: Secondary | ICD-10-CM

## 2022-12-13 DIAGNOSIS — M160 Bilateral primary osteoarthritis of hip: Secondary | ICD-10-CM | POA: Diagnosis not present

## 2022-12-13 DIAGNOSIS — M25551 Pain in right hip: Secondary | ICD-10-CM

## 2022-12-13 DIAGNOSIS — Z79899 Other long term (current) drug therapy: Secondary | ICD-10-CM | POA: Diagnosis not present

## 2022-12-13 MED ORDER — PREDNISONE 50 MG PO TABS
60.0000 mg | ORAL_TABLET | Freq: Once | ORAL | Status: AC
  Administered 2022-12-13: 60 mg via ORAL
  Filled 2022-12-13: qty 1

## 2022-12-13 MED ORDER — OXYCODONE HCL 5 MG PO TABS
5.0000 mg | ORAL_TABLET | Freq: Once | ORAL | Status: AC
  Administered 2022-12-13: 5 mg via ORAL
  Filled 2022-12-13: qty 1

## 2022-12-13 NOTE — Telephone Encounter (Signed)
Patient called and lvm stating that she is in excruciating pain with her hip.  She would be a new patient.  I tried calling the patient multiple times, no answer and no voicemail.  Pt's # [phone]

## 2022-12-13 NOTE — Discharge Instructions (Addendum)
You were seen in the emergency department for hip pain.  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 We have given you some pain medication and steroids.   I recommend continuing to use tylenol at home.   In your chart I can see the orthopedic office tried to give you a call back while you were in the ER. Please call them back first thing tomorrow morning to schedule an appointment.   Continue to monitor how you're doing and return to the ER for new or worsening symptoms.

## 2022-12-13 NOTE — ED Provider Notes (Signed)
[facility] EMERGENCY DEPARTMENT Provider Note   CSN: [id number] Arrival date & time: 12/13/22  1245     History  Chief Complaint  Patient presents with   Hip Pain    [name] [name] is a 76 y.o. female with history of chronic kidney disease, OSA, GERD, gout, HLD, HTN, paroxysmal atrial fibrillation on eliquis who presents to the emergency department complaining of bilateral hip pain. States both hips have been hurting for "a while", but 4 days ago her right hip was feeling worse. Denies any injuries. States sometimes the pain radiates down into her toes with some intermittent numbness, brought on by certain movements. Has been taking tylenol and using voltaren gel with minimal improvement. Tried to call an orthopedist's office this morning to make an appointment but left a voicemail.    Hip Pain       Home Medications Prior to Admission medications   Medication Sig Start Date End Date Taking? Authorizing Provider  acetaminophen (TYLENOL) 500 MG tablet Take 1,000 mg by mouth every 6 (six) hours as needed for moderate pain.    [provider]  albuterol (VENTOLIN HFA) 108 (90 Base) MCG/ACT inhaler Inhale 2 puffs into the lungs every 6 (six) hours as needed for wheezing or shortness of breath. 06/23/22   Roxan Hockey, MD  apixaban (ELIQUIS) 5 MG TABS tablet Take 1 tablet by mouth 2 (two) times daily.    [provider]  apixaban (ELIQUIS) 5 MG TABS tablet TAKE 1 TABLET BY MOUTH TWICE A DAY 12/13/22   Satira Sark, MD  calcitRIOL (ROCALTROL) 0.25 MCG capsule Take 0.25 mcg by mouth 3 (three) times a week. Monday,Wednesday and Fridays 04/18/22   [provider]  Cholecalciferol (VITAMIN D-3) 25 MCG (1000 UT) CAPS Take 1,000 Units by mouth daily.    [provider]  cyclobenzaprine (FLEXERIL) 5 MG tablet Take 5 mg by mouth 3 (three) times daily as needed for muscle spasms. 06/19/20   [provider]  diclofenac Sodium (VOLTAREN) 1 % GEL  Apply 1 application topically 4 (four) times daily as needed (pain).    [provider]  flecainide (TAMBOCOR) 50 MG tablet TAKE 1 TABLET BY MOUTH TWICE A DAY 12/13/22   Evans Lance, MD  furosemide (LASIX) 40 MG tablet TAKE 1 TABLET BY MOUTH EVERY DAY 12/13/22   Evans Lance, MD  hydrocortisone (ANUSOL-HC) 2.5 % rectal cream PLACE 1 APPLICATION RECTALLY 2 (TWO) TIMES DAILY. 12/13/22   Sherron Monday, NP  losartan (COZAAR) 50 MG tablet TAKE 1 TABLET BY MOUTH EVERY DAY 10/29/22   Evans Lance, MD  lubiprostone (AMITIZA) 24 MCG capsule TAKE 1 CAPSULE BY MOUTH EVERY MORNING AND AT BEDTIME 12/13/22   Sherron Monday, NP  octreotide (SANDOSTATIN) 100 MCG/ML SOLN injection Inject 1 mL (100 mcg total) into the skin every 12 (twelve) hours. 05/17/22 05/17/23  Eloise Harman, DO  ondansetron (ZOFRAN) 4 MG tablet Take 1 tablet (4 mg total) by mouth every 6 (six) hours as needed for nausea. 06/23/22   Roxan Hockey, MD  pantoprazole (PROTONIX) 40 MG tablet Take 1 tablet (40 mg total) by mouth daily. 06/23/22   Roxan Hockey, MD  polyethylene glycol (MIRALAX / GLYCOLAX) 17 g packet Take 17 g by mouth daily as needed for mild constipation. 06/23/22   Roxan Hockey, MD  potassium chloride SA (KLOR-CON) 20 MEQ tablet Take 20 mEq by mouth in the morning, at noon, and at bedtime.    [provider]  rosuvastatin (CRESTOR) 10 MG tablet TAKE 1 TABLET BY MOUTH EVERY DAY 08/21/21   Fayrene Helper, MD  vitamin C (ASCORBIC ACID) 250 MG tablet Take 250 mg by mouth daily.    [provider]      Allergies    Acetaminophen-codeine, Amlodipine, Amoxicillin, Doxycycline, Allopurinol, and Tramadol    Review of Systems   Review of Systems  Musculoskeletal:  Positive for arthralgias.  All other systems reviewed and are negative.   Physical Exam Updated Vital Signs BP (!) 160/57   Pulse (!) 59   Temp 99 F (37.2 C) (Oral)   Resp 16   SpO2 100%  Physical Exam Vitals  and nursing note reviewed.  Constitutional:      Appearance: Normal appearance.  HENT:     Head: Normocephalic and atraumatic.  Eyes:     Conjunctiva/sclera: Conjunctivae normal.  Pulmonary:     Effort: Pulmonary effort is normal. No respiratory distress.  Musculoskeletal:     Comments: Normal ROM of bilateral hips and knees with normal strength. Pain exacerbated with R hip flexion. Strong distal pulses, sensation intact. Pelvis stable without reproducible pain to palpation.   Skin:    General: Skin is warm and dry.  Neurological:     Mental Status: She is alert.  Psychiatric:        Mood and Affect: Mood normal.        Behavior: Behavior normal.     ED Results / Procedures / Treatments   Labs (all labs ordered are listed, but only abnormal results are displayed) Labs Reviewed - No data to display  EKG None  Radiology DG HIPS BILAT WITH PELVIS MIN 5 VIEWS  Result Date: 12/13/2022 CLINICAL DATA:  Bilateral hip pain right-greater-than-left. EXAM: DG HIP (WITH OR WITHOUT PELVIS) 5+V BILAT COMPARISON:  None available FINDINGS: There is diffuse decreased bone mineralization. Mild-to-moderate bilateral sacroiliac subchondral sclerosis. Minimal bilateral superior femoroacetabular joint space narrowing. Moderate superior lateral left acetabular degenerative osteophytosis. Mild superior pubic symphysis joint space narrowing and peripheral osteophytosis. No acute fracture or dislocation. Mild vascular calcifications. IMPRESSION: 1. 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 2. Moderate superolateral left acetabular degenerative osteophytosis. Electronically Signed   By: Yvonne Kendall M.D.   On: 12/13/2022 14:22    Procedures Procedures    Medications Ordered in ED Medications  oxyCODONE (Oxy IR/ROXICODONE) immediate release tablet 5 mg (5 mg Oral Given 12/13/22 1856)  predniSONE (DELTASONE) tablet 60 mg (60 mg Oral Given 12/13/22 1855)    ED Course/ Medical Decision  Making/ A&P                           Medical Decision Making Risk Prescription drug management.  This patient is a 76 y.o. female who presents to the ED for concern of hip pain. No injury.   Differential diagnoses prior to evaluation: Arthritis, bursitis, avascular necrosis  Past Medical History / Social History / Additional history: Chart reviewed. Pertinent results include: chronic kidney disease, OSA, GERD, gout, HLD, HTN, paroxysmal atrial fibrillation on eliquis  Physical Exam: Physical exam performed. The pertinent findings include: Stable pelvis. Normal ROM of bilateral hips and knees with normal strength. Pain exacerbated with R hip flexion. Neurovascularly intact in BLE.   Imaging: X-ray performed of bilateral hips shows bilateral sacroiliac osteoarthritis and moderate left acetabular degenerative osteophytosis.   Medications / Treatment: Given dose of steroids and pain medication   Disposition: After consideration of the diagnostic results and  the patients response to treatment, I feel that emergency department workup does not suggest an emergent condition requiring admission or immediate intervention beyond what has been performed at this time. The plan is: discharge to home with symptomatic management of likely pain related to arthritis. Recommended follow up with orthopedics. The patient is safe for discharge and has been instructed to return immediately for worsening symptoms, change in symptoms or any other concerns.  Final Clinical Impression(s) / ED Diagnoses Final diagnoses:  Bilateral hip pain  Arthritis    Rx / DC Orders ED Discharge Orders     None      Portions of this report may have been transcribed using voice recognition software. Every effort was made to ensure accuracy; however, inadvertent computerized transcription errors may be present.    Estill Cotta 12/13/22 2107    Isla Pence, MD 12/13/22 2328

## 2022-12-13 NOTE — Telephone Encounter (Signed)
Prescription refill request for Eliquis received. Indication: Afib  Last office visit: 10/05/22 [name] [name])  Scr: 1.89 (11/06/22)  Age: 76 Weight: 80.1kg  Appropriate dose and refill sent to requested pharmacy.

## 2022-12-13 NOTE — ED Triage Notes (Signed)
Pt c/o bilateral hip pain with right worse than left x 1 month. States started as a heaviness like it was going to give out on her. Painful x 4 days. Having to use cane since pain started. Pt moving all extremities. Has taken tylenol volatren with little relief. A/o. Nad. Denies injury

## 2022-12-13 NOTE — ED Notes (Signed)
Pt stated she wants xrays of both hips

## 2022-12-25 DIAGNOSIS — Z9181 History of falling: Secondary | ICD-10-CM | POA: Insufficient documentation

## 2022-12-25 DIAGNOSIS — I4891 Unspecified atrial fibrillation: Secondary | ICD-10-CM | POA: Insufficient documentation

## 2022-12-26 ENCOUNTER — Encounter: Payer: Self-pay | Admitting: Orthopedic Surgery

## 2022-12-26 ENCOUNTER — Ambulatory Visit (INDEPENDENT_AMBULATORY_CARE_PROVIDER_SITE_OTHER): Payer: Medicare HMO | Admitting: Orthopedic Surgery

## 2022-12-26 ENCOUNTER — Ambulatory Visit (INDEPENDENT_AMBULATORY_CARE_PROVIDER_SITE_OTHER): Payer: Medicare HMO

## 2022-12-26 VITALS — BP 182/69 | HR 64 | Ht 62.0 in | Wt 176.0 lb

## 2022-12-26 DIAGNOSIS — M51369 Other intervertebral disc degeneration, lumbar region without mention of lumbar back pain or lower extremity pain: Secondary | ICD-10-CM

## 2022-12-26 DIAGNOSIS — M5136 Other intervertebral disc degeneration, lumbar region: Secondary | ICD-10-CM

## 2022-12-26 DIAGNOSIS — M5410 Radiculopathy, site unspecified: Secondary | ICD-10-CM

## 2022-12-26 DIAGNOSIS — M545 Low back pain, unspecified: Secondary | ICD-10-CM | POA: Diagnosis not present

## 2022-12-26 MED ORDER — PREDNISONE 10 MG PO TABS: 10.0000 mg | ORAL_TABLET | Freq: Three times a day (TID) | ORAL | 0 refills | Status: AC

## 2022-12-26 MED ORDER — GABAPENTIN 100 MG PO CAPS: 100.0000 mg | ORAL_CAPSULE | Freq: Three times a day (TID) | ORAL | 2 refills | Status: AC

## 2022-12-26 MED ORDER — TIZANIDINE HCL 4 MG PO TABS: 4.0000 mg | ORAL_TABLET | Freq: Four times a day (QID) | ORAL | 0 refills | Status: AC | PRN

## 2022-12-26 NOTE — Progress Notes (Signed)
Chief Complaint  Patient presents with   Back Pain    Down right leg    77 year old female presented to the ER with hip pain.  She had a complaints of radicular symptoms.  She was given some medication in the ER which seemed to help.  She was also started on prednisone from her primary care doctor and that has eased the pain somewhat.  Her symptoms include right hip pain which radiates down the leg into the foot  X-rays of the hips in the ER were normal  She has had no prior episodes of severe back pain although she has had some giving way episodes of the right leg.  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  Constitutional:      Appearance: Normal appearance.  HENT:     Head: Normocephalic and atraumatic.  Eyes:     General: No scleral icterus.       Right eye: No discharge.        Left eye: No discharge.     Extraocular Movements: Extraocular movements intact.     Conjunctiva/sclera: Conjunctivae normal.     Pupils: Pupils are equal, round, and reactive to light.  Cardiovascular:     Rate and Rhythm: Normal rate.     Pulses: Normal pulses.  Skin:    General: Skin is warm and dry.     Capillary Refill: Capillary refill takes less than 2 seconds.  Neurological:     General: No focal  deficit present.     Mental Status: She is alert and oriented to person, place, and time.  Psychiatric:        Mood and Affect: Mood normal.        Behavior: Behavior normal.        Thought Content: Thought content normal.        Judgment: Judgment normal.    Examination of the right and left hip reveal normal range of motion leg lengths are equal.  Pain is noted only with palpation of the lower lumbar spine and right and left sides of the lower back.  Reflexes were normal.  Strength was normal in both lower extremities  X-rays were done in the emergency room  The hip is normal on both AP lateral and AP pelvis and both hips  Lumbar spine film was done in the office she has decreased space between L4 and L5 with multiple areas of spondylosis and some abnormal alignment on the AP and lateral film  Encounter Diagnoses  Name Primary?   Lumbar pain    Radicular pain of right lower extremity    DDD (degenerative disc disease), lumbar Yes  Assessment and plan  First-time presentation with radicular symptoms in the right leg with some underlying suggestive symptoms of underlying disc disease  Will start the patient on some medication understanding that she is on Eliquis  Will start also physical therapy  Return in 8 weeks  Meds ordered this encounter  Medications   predniSONE (DELTASONE) 10 MG tablet    Sig: Take 1 tablet (10 mg total) by mouth 3 (three) times daily.    Dispense:  42 tablet    Refill:  0   gabapentin (NEURONTIN) 100 MG capsule    Sig: Take 1 capsule (100 mg total) by mouth 3 (three) times daily.    Dispense:  90 capsule    Refill:  2   tiZANidine (ZANAFLEX) 4 MG tablet    Sig: Take 1 tablet (4 mg total) by mouth every 6 (six) hours as needed for muscle spasms.    Dispense:  30 tablet    Refill:  0

## 2022-12-26 NOTE — Patient Instructions (Signed)
Physical therapy has been ordered for you at [facility]. They should call you to schedule, [phone] is the phone number to call, if you want to call to schedule.   

## 2023-01-01 ENCOUNTER — Inpatient Hospital Stay: Payer: Medicare HMO

## 2023-01-02 ENCOUNTER — Inpatient Hospital Stay: Payer: Medicare HMO

## 2023-01-02 ENCOUNTER — Other Ambulatory Visit (HOSPITAL_COMMUNITY)
Admission: RE | Admit: 2023-01-02 | Discharge: 2023-01-02 | Disposition: A | Discharge status: Home / Self Care | Payer: Medicare HMO | Source: Ambulatory Visit | Attending: Nephrology | Admitting: Nephrology

## 2023-01-02 ENCOUNTER — Inpatient Hospital Stay: Payer: Medicare HMO | Attending: Hematology

## 2023-01-02 DIAGNOSIS — D638 Anemia in other chronic diseases classified elsewhere: Secondary | ICD-10-CM | POA: Insufficient documentation

## 2023-01-02 DIAGNOSIS — E211 Secondary hyperparathyroidism, not elsewhere classified: Secondary | ICD-10-CM | POA: Insufficient documentation

## 2023-01-02 DIAGNOSIS — I5032 Chronic diastolic (congestive) heart failure: Secondary | ICD-10-CM | POA: Insufficient documentation

## 2023-01-02 DIAGNOSIS — D631 Anemia in chronic kidney disease: Secondary | ICD-10-CM | POA: Diagnosis not present

## 2023-01-02 DIAGNOSIS — N184 Chronic kidney disease, stage 4 (severe): Secondary | ICD-10-CM | POA: Insufficient documentation

## 2023-01-02 DIAGNOSIS — R809 Proteinuria, unspecified: Secondary | ICD-10-CM | POA: Insufficient documentation

## 2023-01-02 DIAGNOSIS — I130 Hypertensive heart and chronic kidney disease with heart failure and stage 1 through stage 4 chronic kidney disease, or unspecified chronic kidney disease: Secondary | ICD-10-CM | POA: Insufficient documentation

## 2023-01-02 DIAGNOSIS — D508 Other iron deficiency anemias: Secondary | ICD-10-CM | POA: Insufficient documentation

## 2023-01-02 DIAGNOSIS — I129 Hypertensive chronic kidney disease with stage 1 through stage 4 chronic kidney disease, or unspecified chronic kidney disease: Secondary | ICD-10-CM | POA: Insufficient documentation

## 2023-01-02 DIAGNOSIS — D500 Iron deficiency anemia secondary to blood loss (chronic): Secondary | ICD-10-CM | POA: Insufficient documentation

## 2023-01-02 LAB — CBC
HCT: 40.4 % (ref 36.0–46.0)
Hemoglobin: 12.4 g/dL (ref 12.0–15.0)
MCH: 29 pg (ref 26.0–34.0)
MCHC: 30.7 g/dL (ref 30.0–36.0)
MCV: 94.4 fL (ref 80.0–100.0)
Platelets: 270 10*3/uL (ref 150–400)
RBC: 4.28 MIL/uL (ref 3.87–5.11)
RDW: 16.2 % — ABNORMAL HIGH (ref 11.5–15.5)
WBC: 11.7 10*3/uL — ABNORMAL HIGH (ref 4.0–10.5)
nRBC: 0 % (ref 0.0–0.2)

## 2023-01-02 LAB — RENAL FUNCTION PANEL
Albumin: 3.9 g/dL (ref 3.5–5.0)
Anion gap: 11 (ref 5–15)
BUN: 35 mg/dL — ABNORMAL HIGH (ref 8–23)
CO2: 27 mmol/L (ref 22–32)
Calcium: 8.8 mg/dL — ABNORMAL LOW (ref 8.9–10.3)
Chloride: 97 mmol/L — ABNORMAL LOW (ref 98–111)
Creatinine, Ser: 1.75 mg/dL — ABNORMAL HIGH (ref 0.44–1.00)
GFR, Estimated: 30 mL/min — ABNORMAL LOW (ref >60)
Glucose, Bld: 146 mg/dL — ABNORMAL HIGH (ref 70–99)
Phosphorus: 3.8 mg/dL (ref 2.5–4.6)
Potassium: 3.7 mmol/L (ref 3.5–5.1)
Sodium: 135 mmol/L (ref 135–145)

## 2023-01-02 LAB — PROTEIN / CREATININE RATIO, URINE
Creatinine, Urine: 72.12 mg/dL
Protein Creatinine Ratio: 0.17 mg/mg{Cre} — ABNORMAL HIGH (ref 0.00–0.15)
Total Protein, Urine: 12 mg/dL

## 2023-01-02 NOTE — Progress Notes (Signed)
   No Epogen today per per order parameters due to Hgb of 12.4

## 2023-01-03 ENCOUNTER — Telehealth: Payer: Self-pay | Admitting: *Deleted

## 2023-01-03 ENCOUNTER — Telehealth: Payer: Self-pay

## 2023-01-03 NOTE — Telephone Encounter (Signed)
See previous note

## 2023-01-03 NOTE — Telephone Encounter (Signed)
Pt called and wants to know if it is fine to have therapy for back pain, because she previously had some bleeding veins in her stomach and intestines. She states they were zapped and she does not want to make them bleed again.

## 2023-01-03 NOTE — Telephone Encounter (Signed)
Spoke to pt, informed her that therapy would be fine to do. Pt voiced understanding.

## 2023-01-03 NOTE — Telephone Encounter (Signed)
Pt called and left her name and number for a call back

## 2023-01-16 ENCOUNTER — Ambulatory Visit (HOSPITAL_COMMUNITY): Payer: Medicare HMO | Admitting: Physical Therapy

## 2023-01-22 ENCOUNTER — Ambulatory Visit (HOSPITAL_COMMUNITY): Payer: Medicare HMO | Attending: Orthopedic Surgery

## 2023-01-22 DIAGNOSIS — M5459 Other low back pain: Secondary | ICD-10-CM | POA: Insufficient documentation

## 2023-01-22 DIAGNOSIS — M25551 Pain in right hip: Secondary | ICD-10-CM

## 2023-01-22 DIAGNOSIS — M5410 Radiculopathy, site unspecified: Secondary | ICD-10-CM | POA: Diagnosis not present

## 2023-01-22 DIAGNOSIS — M545 Low back pain, unspecified: Secondary | ICD-10-CM | POA: Diagnosis not present

## 2023-01-22 NOTE — Addendum Note (Signed)
Addended by: Marda Stalker on: 01/22/2023 12:05 PM   Modules accepted: Orders

## 2023-01-22 NOTE — Therapy (Addendum)
OUTPATIENT PHYSICAL THERAPY THORACOLUMBAR EVALUATION   Patient Name: SUMEYA YONTZ MRN: [id number] DOB:December 17, 1946, 77 y.o., female Today's Date: 01/22/2023  END OF SESSION:  PT End of Session - 01/22/23 0943     Visit Number 1    Number of Visits 16    Date for PT Re-Evaluation 03/19/23    Authorization Type [employer] (no auth and no visit limit)    Progress Note Due on Visit 10    PT Start Time 0900    PT Stop Time 0945    PT Time Calculation (min) 45 min    Activity Tolerance Patient limited by fatigue;Patient tolerated treatment well             Past Medical History:  Diagnosis Date   Anemia    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[place]) 09/11/2022   Chronic kidney disease    Constipation 07/17/2017   GERD (gastroesophageal reflux disease)    Gout    Grade II diastolic dysfunction 49/12/7913   Hyperlipidemia    Hypertension    Hypokalemia    OSA (obstructive sleep apnea) 08/09/2020   PAF (paroxysmal atrial fibrillation) (HCC)    PAF (paroxysmal atrial fibrillation) (HCC)    PUD (peptic ulcer disease)    Syncope    Neurocardiogenic   Past Surgical History:  Procedure Laterality Date   ABDOMINAL AORTOGRAM W/LOWER EXTREMITY Right 12/13/2020   Procedure: ABDOMINAL AORTOGRAM W/LOWER EXTREMITY;  Surgeon: Serafina Mitchell, MD;  Location: [place] [facility];  Service: Cardiovascular;  Laterality: Right;   ABDOMINAL HYSTERECTOMY     AMPUTATION Right 12/14/2020   Procedure: Right fifth toe amputation;  Surgeon: Rosetta Posner, MD;  Location: [facility] OR;  Service: Vascular;  Laterality: Right;   CATARACT EXTRACTION Bilateral    COLONOSCOPY N/A 10/07/2014   Dr. Oneida Alar: redundant left colon, moderate sized external hemorrhoids    COLONOSCOPY N/A 03/11/2020   Surgeon: Danie Binder, MD;  9 polyps, majority of which were tubular adenomas, nodular mucosa in the anus s/p biopsy which was benign.   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    HOT HEMOSTASIS  11/27/2020   Procedure: HOT HEMOSTASIS (ARGON PLASMA COAGULATION/BICAP);  Surgeon: Montez Morita, Quillian Quince, MD;  [facility];  Service: Gastroenterology;;   LIPOMA RESECTION     PERIPHERAL VASCULAR INTERVENTION Right 12/13/2020   Procedure: PERIPHERAL VASCULAR INTERVENTION;  Surgeon: Serafina Mitchell, MD;  Location: [place] [facility];  Service: Cardiovascular;  Laterality: Right;  SFA/PT/AT   POLYPECTOMY  03/11/2020   Procedure: POLYPECTOMY;  Surgeon: Danie Binder, MD;  [facility]: [facility];  Service: Endoscopy;;  cecal, transverse, descending, sigmoid   TEE WITHOUT CARDIOVERSION N/A 05/10/2021   Procedure: TRANSESOPHAGEAL ECHOCARDIOGRAM (TEE) WATCHMEN EVALUATION;  Surgeon: Josue Hector, MD;  Location[facility] ENDOSCOPY;  Service: Cardiovascular;  Laterality: N/A;   Patient Active Problem List   Diagnosis Date Noted   At high risk for falls 12/25/2022   Atrial fibrillation ([place]) 12/25/2022   Pulmonary HTN ([place]) 10/03/2022   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  [place]) 09/11/2022   Dyspnea 06/21/2022   CKD (chronic kidney disease) stage 4, GFR 15-29 ml/min (HCC) 06/21/2022   IBS (irritable bowel syndrome) 05/30/2022   Arteriovenous malformation of small intestine 03/16/2021   AVM (arteriovenous malformation) of small bowel, 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/2021   Upper GI bleed    Dry gangrene ([place])    GI bleed 12/05/2020   Acute blood loss anemia 11/24/2020   Heme positive stool    Iron deficiency anemia due to chronic blood loss     Symptomatic anemia 11/23/2020   Grade II diastolic dysfunction 09/73/5329   Hyponatremia 11/23/2020   Symptomatic bradycardia 10/20/2020   AKI (acute kidney injury) ([place]) 10/20/2020   Microcytic anemia 10/20/2020   Amnesia 09/28/2020   Chronic obstructive pulmonary disease ([place]) 09/28/2020   Hirsutism 09/28/2020   Loss of appetite 09/28/2020   Periodontal disease 09/28/2020   Vitamin D deficiency 09/28/2020   OSA (obstructive sleep apnea) 08/09/2020   Paroxysmal atrial fibrillation ([place]) 06/15/2020   Bradycardia with 31-40 beats per minute 06/15/2020   Snoring 06/15/2020   Intermittent palpitations 06/15/2020   Near syncope 06/15/2020   Essential hypertension 06/15/2020   Atrial fibrillation with RVR ([place]) 05/04/2020   Hyperlipidemia    Gout    Encounter for screening colonoscopy    Hemorrhoid 12/16/2018   GERD (gastroesophageal reflux disease) 01/29/2018   Constipation 07/17/2017   Rectal bleeding 01/30/2017   Chest pain 12/09/2013   Hypokalemia 05/01/2012   Renal insufficiency 05/01/2012   Essential hypertension, benign 05/03/2010   VASOVAGAL SYNCOPE 05/03/2010    PCP: Andres Shad, MD   REFERRING PROVIDER: Carole Civil, MD  REFERRING DIAG: M54.50 (ICD-10-CM) - Lumbar pain M54.10 (ICD-10-CM) - Radicular pain of right lower extremity  Rationale for Evaluation and Treatment: Rehabilitation  THERAPY DIAG:  Other low back pain  Pain in right hip  ONSET DATE: 1.5 months ago  SUBJECTIVE:                                                                                                                                                                                           SUBJECTIVE STATEMENT: Patient states that the back doesn't really bother her but her B hips (R worse than the L) are aggravating her. At times, the low back would hurt her but patient cannot associate any aggravating or relieving factors for the back. 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 Patient did the job for almost 3 days. Since then, pain just gradually got worse. Denies any falls/trauma on the low back. Pain was so bad that  she had to go to the ER on 12/13/22 and X-ray was done which revealed arthritis on the R hip. Patient was given prednisone and pain meds which only helped for a day. Patient then went to her PCP and was given prednisone again which somehow helped. Patient then went to Dr. Aline Brochure. MD ordered lumbar x-ray which  revealed arthritis. Dr. Aline Brochure thinks that the pain on the hip is coming from the low back. Patient was then referred patient to outpatient PT evaluation and management.  PERTINENT HISTORY:  A. Fib. arthritis  PAIN:  Are you having pain? Yes: NPRS scale: 8/10 Pain location: B hips (R>L) Pain description: aching Aggravating factors: walking, standing Relieving factors: sitting, resting  PRECAUTIONS: Other: bleeding issues due to Eliquis  WEIGHT BEARING RESTRICTIONS: No  FALLS:  Has patient fallen in last 6 months? No  LIVING ENVIRONMENT: Lives with: lives with their family and lives alone Lives in: House/apartment Stairs: Yes: Internal: 2 steps; on right going up Has following equipment at home: Gilford Rile - 2 wheeled  OCCUPATION: retired  PLOF: Independent  PATIENT GOALS: patient wants to be relieved of pain.  NEXT MD VISIT: February 2024  OBJECTIVE:   DIAGNOSTIC FINDINGS:  12/26/22 Lumbar X-ray  degenerative disc disease at L4-5 multilevels of spondylosis lumbar spine   12/13/22 B hip x-ray IMPRESSION: 1. 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 2. Moderate superolateral left acetabular degenerative osteophytosis.   PATIENT SURVEYS:  FOTO 57.33  SCREENING FOR RED FLAGS: Bowel or bladder incontinence: No Spinal tumors: No Cauda equina syndrome: No COGNITION: Overall cognitive status: Within functional limits for tasks  assessed     SENSATION: Not tested  MUSCLE LENGTH: Mild tightness on B hamstrings Moderate tightness on B hip flexors and piriformis  POSTURE: anterior pelvic tilt, B medial malleoli are level in supine  LUMBAR ROM:   AROM eval  Flexion 100%  Extension 25% with slight low back pain  Right lateral flexion   Left lateral flexion   Right rotation 50%  Left rotation 50%   (Blank rows = not tested)  LOWER EXTREMITY ROM:     Active  Right eval Left eval  Hip flexion [facility]  Hip extension    Hip abduction [facility]  Hip adduction [facility]  Hip internal rotation    Hip external rotation    Knee flexion [facility] WFL  Knee extension [facility]  Ankle dorsiflexion [facility] WFL  Ankle plantarflexion [facility] WFL  Ankle inversion    Ankle eversion     (Blank rows = not tested)  LOWER EXTREMITY MMT:    MMT Right eval Left eval  Hip flexion 3+ 4-  Hip extension 3+ 3+  Hip abduction 3+ 4-  Hip adduction 3+ 4-  Hip internal rotation    Hip external rotation    Knee flexion 4 4  Knee extension 4 4  Ankle dorsiflexion 5 5  Ankle plantarflexion 5 5  Ankle inversion    Ankle eversion     (Blank rows = not tested)  LUMBAR SPECIAL TESTS:  Straight leg raise test: Negative, Slump test: Negative, FABER test: Positive, and Thomas test: Positive H & I test: no pain  FUNCTIONAL TESTS:  2 minute walk test: 208 ft  GAIT: Distance walked: 208 ft Assistive device utilized: None Level of assistance: Complete Independence Comments: slightly antalgic, little to no unsteadiness seen  TODAY'S TREATMENT:  DATE:  01/22/23  Evaluation performed and patient education Seated: hamstring stretch x 30" x 3  Piriformis stretch x 30"   PATIENT EDUCATION:  Education details: Educated on the pathoanatomy of low back pain. Educated on the goals and course of rehab. Written HEP  provided and reviewed  Person educated: Patient Education method: Explanation and Demonstration Education comprehension: verbalized understanding  HOME EXERCISE PROGRAM: Access Code: [email or web address] URL: [email or web address] Date: 01/22/2023 Prepared by: Rexene Alberts  Exercises - Seated Hamstring Stretch  - 1-2 x daily - 7 x weekly - 3 reps - 30 hold - Seated Piriformis Stretch  - 1-2 x daily - 7 x weekly - 3 sets - 30 reps  ASSESSMENT:  CLINICAL IMPRESSION: Patient is a 77 y.o. female who was seen today for physical therapy evaluation and treatment for low back pain and R hip pain.Patient was diagnosed with radicular pain on the R LE by referring provider further defined by difficulty with walking and standing due to pain, weakness, and decreased soft tissue flexibility. Skilled PT is required to address the impairments and functional limitations listed below. With the findings listed above, patient's source of pain maybe intrinsic to the hip and not from the back as patient was positive for the FABER test and was negative for the SLR and slump test. Patient will benefit from core and hip strengthening as well as improving flexibility of the LE. Tolerated stretching exercises today without worsening of symptoms except on the piriformis stretch where patient felt a little discomfort on the R hip. Pacing of activities was slightly slow on today's session  OBJECTIVE IMPAIRMENTS: difficulty walking, decreased ROM, decreased strength, impaired flexibility, and pain.   ACTIVITY LIMITATIONS: standing and walking  PARTICIPATION LIMITATIONS: community activity  PERSONAL FACTORS: Age and 1-2 comorbidities:    are also affecting patient's functional outcome.   REHAB POTENTIAL: Good  CLINICAL DECISION MAKING: Stable/uncomplicated  EVALUATION COMPLEXITY: Moderate   GOALS: Goals reviewed with patient? Yes  SHORT TERM GOALS: Target date: 01/19/23  Ptwill demonstrate indep in HEP  to facilitate carry-over of skilled services and improve functional outcomes Goal status: INITIAL  2.  Pt will report decrease in pain to 4 to facilitate ease in ADLs Baseline: 8/10 Goal status: INITIAL  3.  Pt will demonstrate improvement of lumbar extension by 25% to facilitate ease in ADLs Baseline: 25% Goal status: INITIAL  4.  Pt will demonstrate increase in LE strength to 4/5 to facilitate ease and safety in ambulation  Baseline: 3+ Goal status: INITIAL  LONG TERM GOALS: Target date: 03/19/23  Pt will increase FOTO to at least 64 in order to demonstrate significant improvement in function related to walking and standing Baseline: 57.33 Goal status: INITIAL  2.  Patient will be able to walk and stand for prolonged periods with slight to little pain (2/10) Goal status: INITIAL    PLAN:  PT FREQUENCY: 2x/week  PT DURATION: 8 weeks  PLANNED INTERVENTIONS: Therapeutic exercises, Therapeutic activity, Neuromuscular re-education, Gait training, Patient/Family education, Self Care, and Manual therapy.  PLAN FOR NEXT SESSION: May LE and core strengthening as well as ROM exercises   Ashby Moskal L. Dara Beidleman, PT, DPT, OCS Board-Certified Clinical Specialist in [place] # ([place]): [id number] T 01/22/2023, 10:19 AM

## 2023-01-24 ENCOUNTER — Ambulatory Visit (HOSPITAL_COMMUNITY): Payer: Medicare HMO | Attending: Orthopedic Surgery

## 2023-01-24 DIAGNOSIS — M5459 Other low back pain: Secondary | ICD-10-CM | POA: Diagnosis present

## 2023-01-24 DIAGNOSIS — M25551 Pain in right hip: Secondary | ICD-10-CM | POA: Diagnosis present

## 2023-01-24 NOTE — Therapy (Signed)
OUTPATIENT PHYSICAL THERAPY THORACOLUMBAR EVALUATION   Patient Name: [name] [name] MRN: [id number] DOB:10/03/1946, 77 y.o., female Today's Date: 01/24/2023  END OF SESSION:  PT End of Session - 01/24/23 0809     Visit Number 2    Number of Visits 16    Date for PT Re-Evaluation 03/19/23    Authorization Type [employer] (no auth and no visit limit)    Progress Note Due on Visit 10    PT Start Time 0810    PT Stop Time 0850    PT Time Calculation (min) 40 min    Activity Tolerance Patient limited by fatigue;Patient tolerated treatment well             Past Medical History:  Diagnosis Date   Anemia    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[place]) 09/11/2022   Chronic kidney disease    Constipation 07/17/2017   GERD (gastroesophageal reflux disease)    Gout    Grade II diastolic dysfunction 96/01/2296   Hyperlipidemia    Hypertension    Hypokalemia    OSA (obstructive sleep apnea) 08/09/2020   PAF (paroxysmal atrial fibrillation) (HCC)    PAF (paroxysmal atrial fibrillation) (HCC)    PUD (peptic ulcer disease)    Syncope    Neurocardiogenic   Past Surgical History:  Procedure Laterality Date   ABDOMINAL AORTOGRAM W/LOWER EXTREMITY Right 12/13/2020   Procedure: ABDOMINAL AORTOGRAM W/LOWER EXTREMITY;  Surgeon: Serafina Mitchell, MD;  Location: [place] [facility];  Service: Cardiovascular;  Laterality: Right;   ABDOMINAL HYSTERECTOMY     AMPUTATION Right 12/14/2020   Procedure: Right fifth toe amputation;  Surgeon: Rosetta Posner, MD;  Location: [facility] OR;  Service: Vascular;  Laterality: Right;   CATARACT EXTRACTION Bilateral    COLONOSCOPY N/A 10/07/2014   Dr. Oneida Alar: redundant left colon, moderate sized external hemorrhoids    COLONOSCOPY N/A 03/11/2020   Surgeon: Danie Binder, MD;  9 polyps, majority of which were tubular adenomas, nodular mucosa in the anus s/p biopsy which was benign.   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    HOT HEMOSTASIS  11/27/2020   Procedure: HOT HEMOSTASIS (ARGON PLASMA COAGULATION/BICAP);  Surgeon: Montez Morita, Quillian Quince, MD;  [facility];  Service: Gastroenterology;;   LIPOMA RESECTION     PERIPHERAL VASCULAR INTERVENTION Right 12/13/2020   Procedure: PERIPHERAL VASCULAR INTERVENTION;  Surgeon: Serafina Mitchell, MD;  Location: [place] [facility];  Service: Cardiovascular;  Laterality: Right;  SFA/PT/AT   POLYPECTOMY  03/11/2020   Procedure: POLYPECTOMY;  Surgeon: Danie Binder, MD;  [facility];  Service: Endoscopy;;  cecal, transverse, descending, sigmoid   TEE WITHOUT CARDIOVERSION N/A 05/10/2021   Procedure: TRANSESOPHAGEAL ECHOCARDIOGRAM (TEE) WATCHMEN EVALUATION;  Surgeon: Josue Hector, MD;  Location: [facility];  Service: Cardiovascular;  Laterality: N/A;   Patient Active Problem List   Diagnosis Date Noted   At high risk for falls 12/25/2022   Atrial fibrillation ([place]) 12/25/2022   Pulmonary HTN ([place]) 10/03/2022   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  [place]) 09/11/2022   Dyspnea 06/21/2022   CKD (chronic kidney disease) stage 4, GFR 15-29 ml/min (HCC) 06/21/2022   IBS (irritable bowel syndrome) 05/30/2022   Arteriovenous malformation of small intestine 03/16/2021   AVM (arteriovenous malformation) of small bowel, 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/2021   Upper GI bleed    Dry gangrene ([place])    GI bleed 12/05/2020   Acute blood loss anemia 11/24/2020   Heme positive stool    Iron deficiency anemia due to chronic blood loss    Symptomatic  anemia 11/23/2020   Grade II diastolic dysfunction 70/62/3762   Hyponatremia 11/23/2020   Symptomatic bradycardia 10/20/2020   AKI (acute kidney injury) ([place]) 10/20/2020   Microcytic anemia 10/20/2020   Amnesia 09/28/2020   Chronic obstructive pulmonary disease ([place]) 09/28/2020   Hirsutism 09/28/2020   Loss of appetite 09/28/2020   Periodontal disease 09/28/2020   Vitamin D deficiency 09/28/2020   OSA (obstructive sleep apnea) 08/09/2020   Paroxysmal atrial fibrillation ([place]) 06/15/2020   Bradycardia with 31-40 beats per minute 06/15/2020   Snoring 06/15/2020   Intermittent palpitations 06/15/2020   Near syncope 06/15/2020   Essential hypertension 06/15/2020   Atrial fibrillation with RVR ([place]) 05/04/2020   Hyperlipidemia    Gout    Encounter for screening colonoscopy    Hemorrhoid 12/16/2018   GERD (gastroesophageal reflux disease) 01/29/2018   Constipation 07/17/2017   Rectal bleeding 01/30/2017   Chest pain 12/09/2013   Hypokalemia 05/01/2012   Renal insufficiency 05/01/2012   Essential hypertension, benign 05/03/2010   VASOVAGAL SYNCOPE 05/03/2010    PCP: Andres Shad, MD   REFERRING PROVIDER: Carole Civil, MD  REFERRING DIAG: M54.50 (ICD-10-CM) - Lumbar pain M54.10 (ICD-10-CM) - Radicular pain of right lower extremity  Rationale for Evaluation and Treatment: Rehabilitation  THERAPY DIAG:  Other low back pain  Pain in right hip  ONSET DATE: 1.5 months ago  SUBJECTIVE:                                                                                                                                                                                           SUBJECTIVE STATEMENT: Patient states she rated her pain incorrectly at eval; she did not understand pain scale.  3/10 pain today in the right hip. Takes Gabapentin for pain but has not taken yet this morning.     Patient states that the back doesn't really bother her but her B hips (R worse  than the L) are aggravating her. At times, the low back would hurt her but patient cannot associate any aggravating or relieving factors for the back. 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 Patient did the job for almost 3 days. Since then, pain just gradually got worse. Denies any falls/trauma on the low back. Pain was so bad that she had to go to the ER on 12/13/22 and X-ray was done which revealed arthritis on the R hip. Patient was given prednisone and pain meds which only helped for a day. Patient then went to her PCP and was given prednisone again which somehow helped. Patient then went to Dr. Aline Brochure. MD ordered lumbar x-ray which  revealed arthritis. Dr. Aline Brochure thinks that the pain on the hip is coming from the low back. Patient was then referred patient to outpatient PT evaluation and management.  PERTINENT HISTORY:  A. Fib. arthritis  PAIN:  Are you having pain? Yes: NPRS scale: 3/10 Pain location: B hips (R>L) Pain description: aching Aggravating factors: walking, standing Relieving factors: sitting, resting  PRECAUTIONS: Other: bleeding issues due to Eliquis  WEIGHT BEARING RESTRICTIONS: No  FALLS:  Has patient fallen in last 6 months? No  LIVING ENVIRONMENT: Lives with: lives with their family and lives alone Lives in: House/apartment Stairs: Yes: Internal: 2 steps; on right going up Has following equipment at home: Gilford Rile - 2 wheeled  OCCUPATION: retired  PLOF: Independent  PATIENT GOALS: patient wants to be relieved of pain.  NEXT MD VISIT: February 2024  OBJECTIVE:   DIAGNOSTIC FINDINGS:  12/26/22 Lumbar X-ray  degenerative disc disease at L4-5 multilevels of spondylosis lumbar spine   12/13/22 B hip x-ray IMPRESSION: 1. 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 2. Moderate superolateral left acetabular degenerative osteophytosis.   PATIENT SURVEYS:  FOTO  57.33  SCREENING FOR RED FLAGS: Bowel or bladder incontinence: No Spinal tumors: No Cauda equina syndrome: No COGNITION: Overall cognitive status: Within functional limits for tasks assessed     SENSATION: Not tested  MUSCLE LENGTH: Mild tightness on B hamstrings Moderate tightness on B hip flexors and piriformis  POSTURE: anterior pelvic tilt, B medial malleoli are level in supine  LUMBAR ROM:   AROM eval  Flexion 100%  Extension 25% with slight low back pain  Right lateral flexion   Left lateral flexion   Right rotation 50%  Left rotation 50%   (Blank rows = not tested)  LOWER EXTREMITY ROM:     Active  Right eval Left eval  Hip flexion [facility]  Hip extension    Hip abduction [facility]  Hip adduction [facility]  Hip internal rotation    Hip external rotation    Knee flexion [facility] WFL  Knee extension [facility]  Ankle dorsiflexion [facility] WFL  Ankle plantarflexion [facility] WFL  Ankle inversion    Ankle eversion     (Blank rows = not tested)  LOWER EXTREMITY MMT:    MMT Right eval Left eval  Hip flexion 3+ 4-  Hip extension 3+ 3+  Hip abduction 3+ 4-  Hip adduction 3+ 4-  Hip internal rotation    Hip external rotation    Knee flexion 4 4  Knee extension 4 4  Ankle dorsiflexion 5 5  Ankle plantarflexion 5 5  Ankle inversion    Ankle eversion     (Blank rows = not tested)  LUMBAR SPECIAL TESTS:  Straight leg raise test: Negative, Slump test: Negative, FABER test: Positive, and Thomas test: Positive H & I test: no pain  FUNCTIONAL TESTS:  2 minute walk test: 208 ft  GAIT: Distance walked: 208 ft Assistive device utilized:  None Level of assistance: Complete Independence Comments: slightly antalgic, little to no unsteadiness seen  TODAY'S TREATMENT:                                                                                                                              DATE:  01/24/2023 Review of HEP and goals Seated: Hamstring stretch 3 x 20"  each Piriformis stretch 3 x 20" each  Supine: LTR x 10 Abdominal bracing 5" hold x 10 Bridge x 10 Abdominal bracing with marching x 10  Sidelying clam 2 x 10 each  Prone lying x 2' (pain goes away completely)   01/22/23  Evaluation performed and patient education Seated: hamstring stretch x 30" x 3  Piriformis stretch x 30"   PATIENT EDUCATION:  Education details: Educated on the pathoanatomy of low back pain. Educated on the goals and course of rehab. Written HEP provided and reviewed  Person educated: Patient Education method: Explanation and Demonstration Education comprehension: verbalized understanding  HOME EXERCISE PROGRAM:  AccAccess Code: [email or web address] URL: [email or web address] Date: 01/24/2023 Prepared by: [facility]  Exercises - Seated Hamstring Stretch  - 1-2 x daily - 7 x weekly - 3 reps - 30 hold - Seated Piriformis Stretch  - 1-2 x daily - 7 x weekly - 3 sets - 30 reps - Supine Lower Trunk Rotation  - 1-2 x daily - 7 x weekly - 1 sets - 10 reps - Supine Transversus Abdominis Bracing - Hands on Stomach  - 1-2 x daily - 7 x weekly - 1 sets - 10 reps - 5 ec hold - Supine Bridge  - 1-2 x daily - 7 x weekly - 1 sets - 10 reps - Supine March  - 1-2 x daily - 7 x weekly - 1 sets - 10 reps - Clamshell  - 1-2 x daily - 7 x weekly - 2 sets - 10 repsess Code: [email or web address] URL: [email or web address] Date: 01/22/2023 Prepared by: Rexene Alberts  Exercises - Seated Hamstring Stretch  - 1-2 x daily - 7 x weekly - 3 reps - 30 hold - Seated Piriformis Stretch  - 1-2 x daily - 7 x weekly - 3 sets - 30 reps  ASSESSMENT:  CLINICAL IMPRESSION: Today's session started with a review of HEP and goals. Patient verbalizes agreement with set goals. Progressed lumbar and hip mobility and strengthening exercises today without issue.  Updated HEP. She reports some increased soreness right hip with movement/activity. Patient's pain goes away completely with  prone lying today but returns on sitting. Patient will benefit from continued skilled therapy services to address deficits and promote return to optimal function.       Eval:Patient is a 77 y.o. female who was seen today for physical therapy evaluation and treatment for low back pain and R hip pain.Patient was diagnosed with radicular pain on the R LE by referring provider further defined by difficulty with walking and standing due to pain, weakness, and  decreased soft tissue flexibility. Skilled PT is required to address the impairments and functional limitations listed below. With the findings listed above, patient's source of pain maybe intrinsic to the hip and not from the back as patient was positive for the FABER test and was negative for the SLR and slump test. Patient will benefit from core and hip strengthening as well as improving flexibility of the LE. Tolerated stretching exercises today without worsening of symptoms except on the piriformis stretch where patient felt a little discomfort on the R hip. Pacing of activities was slightly slow on today's session  OBJECTIVE IMPAIRMENTS: difficulty walking, decreased ROM, decreased strength, impaired flexibility, and pain.   ACTIVITY LIMITATIONS: standing and walking  PARTICIPATION LIMITATIONS: community activity  PERSONAL FACTORS: Age and 1-2 comorbidities:    are also affecting patient's functional outcome.   REHAB POTENTIAL: Good  CLINICAL DECISION MAKING: Stable/uncomplicated  EVALUATION COMPLEXITY: Moderate   GOALS: Goals reviewed with patient? Yes  SHORT TERM GOALS: Target date: 01/19/23  Ptwill demonstrate indep in HEP to facilitate carry-over of skilled services and improve functional outcomes Goal status: IN PROGRESS  2.  Pt will report decrease in pain to 4 to facilitate ease in ADLs Baseline: 8/10 Goal status: IN PROGRESS  3.  Pt will demonstrate improvement of lumbar extension by 25% to facilitate ease in  ADLs Baseline: 25% Goal status: IN PROGRESS  4.  Pt will demonstrate increase in LE strength to 4/5 to facilitate ease and safety in ambulation  Baseline: 3+ Goal status: IN PROGRESS  LONG TERM GOALS: Target date: 03/19/23  Pt will increase FOTO to at least 64 in order to demonstrate significant improvement in function related to walking and standing Baseline: 57.33 Goal status: IN PROGRESS  2.  Patient will be able to walk and stand for prolonged periods with slight to little pain (2/10) Goal status: IN PROGRESS    PLAN:  PT FREQUENCY: 2x/week  PT DURATION: 8 weeks  PLANNED INTERVENTIONS: Therapeutic exercises, Therapeutic activity, Neuromuscular re-education, Gait training, Patient/Family education, Self Care, and Manual therapy.  PLAN FOR NEXT SESSION: progress LE and core strengthening as well as ROM exercises; sees MD 02/01/23; try extension progression   8:52 AM, 01/24/23 Isobella Ascher Small Opel Lejeune MPT [facility]  [phone]

## 2023-01-25 ENCOUNTER — Telehealth: Payer: Self-pay | Admitting: *Deleted

## 2023-01-25 ENCOUNTER — Other Ambulatory Visit: Payer: Self-pay | Admitting: Gastroenterology

## 2023-01-25 ENCOUNTER — Encounter: Payer: Self-pay | Admitting: *Deleted

## 2023-01-25 DIAGNOSIS — K5521 Angiodysplasia of colon with hemorrhage: Secondary | ICD-10-CM

## 2023-01-25 MED ORDER — OCTREOTIDE ACETATE 100 MCG/ML IJ SOLN: 100.0000 ug | Freq: Two times a day (BID) | INTRAMUSCULAR | 11 refills | Status: DC

## 2023-01-25 NOTE — Telephone Encounter (Signed)
Noted  

## 2023-01-25 NOTE — Telephone Encounter (Signed)
Pt needs a new prescription for Octreotide Inj 163mg. Sent to [facility].

## 2023-01-28 NOTE — Progress Notes (Unsigned)
[place], [place] [address]   CLINIC:  Medical Oncology/Hematology  PCP:  Andres Shad, MD [address] [place] [address] [phone]   REASON FOR VISIT:  Follow-up for normocytic anemia  CURRENT THERAPY: IV iron and Retacrit  INTERVAL HISTORY:   [name] [name] 77 y.o. 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  She was last seen by Tarri Abernethy PA-C on 11/06/2022.  At today's visit, she reports feeling fair.  No recent hospitalizations, surgeries, or changes in baseline health status.  After last visit, she received 3 doses of Venofer 300 mg from 11/08/2022 through 12/10/2022.  She has not required her Epogen since 09/25/2022.  She has been feeling a bit more fatigued for the past week.  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  She is on octreotide and her Eliquis has been resumed.  She has not noticed any melena or rectal bleeding since her small bowel endoscopy in October, and overall is feeling much better.  Her ice pica has resolved.  No chest pain, lightheadedness, or passing out.  Chronic dyspnea on exertion is at baseline.  She has 60% energy and 100% appetite. She endorses that she is maintaining a stable weight.  ASSESSMENT & PLAN:  1.  Iron deficiency anemia - Seen at the request of Dr. 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 - She takes Protonix and self administers SQ octreotide twice daily.  She is on Eliquis for atrial fibrillation (cleared by GI). 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 - Hospitalized from 06/21/2022 through 06/23/2022 for symptomatic anemia.  Received 1 unit PRBC and IV Feraheme x500 mg during hospitalization. - Hematology work-up (07/02/2022): Mildly elevated reticulocytes 3.7%.  Normal LDH 140. CBC with Hgb at 9.7/MCV 92.2. Normal folate, copper, B12, MMA, homocystine. Negative SPEP and immunofixation.  Mildly elevated free light chain ratio in keeping with CKD stage IV. - She has intermittent bright red blood per rectum and melena, but this has resolved for the time being ever since small bowel endoscopy at [facility] in October 2023 - Failed to improve on oral iron supplementation - Epogen/Retacrit protocol initiated by neurology in February 2022, but has not required injection since 09/25/2022. - Most recent Venofer 300 mg x3 from 11/08/2022 through 12/10/2022 - Labs today (01/29/2023): Hgb 11.0/MCV 96.0.  Baseline CKD IIIb/IV. Ferritin 170.  Iron saturation 18% - DIFFERENTIAL DIAGNOSIS: Since anemia resolved after IV iron repletion, suspect that her iron deficiency that iron deficiency is the main driving force in her anemia with some small impact due to her advanced CKD. - PLAN: No indication for IV iron at this time. - Blood counts have been significantly improved after IV iron repletion and treatment of chronic GI bleeding.  Therefore, we will DECREASE her Retacrit injections to EVERY 6 WEEKS.  - Continue GI follow-up - Repeat CBC, CMP, and iron panel with RTC in 3 months with same-day visit   2.  Other history - PMH: CKD stage IV, secondary hyperparathyroidism, chronic  diastolic CHF, atrial fibrillation (Eliquis), hypertension, GERD, peripheral arterial disease, mild COPD, and iron deficiency from GI bleeds  - SOCIAL: She is retired and lives at home alone.  She is able to ambulate short distances without difficulty and is independent with ADLs.  She is a former  smoker, smokes 1 PPD for 25 years, quit in 1998.  She denies any alcohol or illicit drug use. - FAMILY: Family history positive for mother with unspecified anemia.  Her sister had breast cancer.  Maternal grandmother also had breast cancer.  PLAN SUMMARY: >> CBC + Possible Retacrit every 6 weeks >> Same day labs (CBC/D, CMP, ferritin, iron/TIBC) + OFFICE visit + injection in 3 months     REVIEW OF SYSTEMS:   Review of Systems  Constitutional:  Positive for fatigue. Negative for appetite change, chills, diaphoresis, fever and unexpected weight change.  HENT:   Negative for lump/mass and nosebleeds.   Eyes:  Negative for eye problems.  Respiratory:  Positive for shortness of breath (with exertion). Negative for cough and hemoptysis.   Cardiovascular:  Negative for chest pain, leg swelling and palpitations.  Gastrointestinal:  Negative for abdominal pain, blood in stool, constipation, diarrhea, nausea and vomiting.  Genitourinary:  Negative for hematuria.   Skin: Negative.   Neurological:  Positive for numbness. Negative for dizziness, headaches and light-headedness.  Hematological:  Does not bruise/bleed easily.     PHYSICAL EXAM:  ECOG PERFORMANCE STATUS: 2 - Symptomatic, <50% confined to bed  There were no vitals filed for this visit. There were no vitals filed for this visit. Physical Exam Constitutional:      Appearance: Normal appearance. She is obese.  HENT:     Head: Normocephalic and atraumatic.     Mouth/Throat:     Mouth: Mucous membranes are moist.  Eyes:     Extraocular Movements: Extraocular movements intact.     Pupils: Pupils are equal, round, and reactive to light.  Cardiovascular:     Rate and Rhythm: Normal rate and regular rhythm.     Pulses: Normal pulses.     Heart sounds: Normal heart sounds.  Pulmonary:     Effort: Pulmonary effort is normal.     Breath sounds: Normal breath sounds.  Abdominal:     General: Bowel sounds are normal.     Palpations:  Abdomen is soft.     Tenderness: There is no abdominal tenderness.  Musculoskeletal:        General: No swelling.     Right lower leg: No edema.     Left lower leg: No edema.  Lymphadenopathy:     Cervical: No cervical adenopathy.  Skin:    General: Skin is warm and dry.  Neurological:     General: No focal deficit present.     Mental Status: She is alert and oriented to person, place, and time.  Psychiatric:        Mood and Affect: Mood normal.        Behavior: Behavior normal.     PAST MEDICAL/SURGICAL HISTORY:  Past Medical History:  Diagnosis Date   Anemia    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[place]) 09/11/2022   Chronic kidney disease    Constipation 07/17/2017   GERD (gastroesophageal reflux disease)    Gout    Grade II diastolic dysfunction 83/02/8249   Hyperlipidemia    Hypertension    Hypokalemia    OSA (obstructive sleep apnea) 08/09/2020   PAF (paroxysmal atrial fibrillation) ([place])  PAF (paroxysmal atrial fibrillation) ([facility])    PUD (peptic ulcer disease)    Syncope    Neurocardiogenic   Past Surgical History:  Procedure Laterality Date   ABDOMINAL AORTOGRAM W/LOWER EXTREMITY Right 12/13/2020   Procedure: ABDOMINAL AORTOGRAM W/LOWER EXTREMITY;  Surgeon: Serafina Mitchell, MD;  Location: [facility];  Service: Cardiovascular;  Laterality: Right;   ABDOMINAL HYSTERECTOMY     AMPUTATION Right 12/14/2020   Procedure: Right fifth toe amputation;  Surgeon: Rosetta Posner, MD;  Location: [facility] OR;  Service: Vascular;  Laterality: Right;   CATARACT EXTRACTION Bilateral    COLONOSCOPY N/A 10/07/2014   Dr. Oneida Alar: redundant left colon, moderate sized external hemorrhoids    COLONOSCOPY N/A 03/11/2020   Surgeon: Danie Binder, MD;  9 polyps, majority of which were tubular adenomas, nodular mucosa in the anus s/p biopsy which was benign.   ENTEROSCOPY N/A 11/27/2020   Surgeon: Montez Morita, Quillian Quince, MD; multiple nonbleeding AVMs in the duodenum s/p APC  therapy, few nonbleeding AVMs in jejunum s/p argon beam coagulation.   ESOPHAGOGASTRODUODENOSCOPY (EGD) WITH PROPOFOL N/A 11/25/2020    Surgeon: Daneil Dolin, MD; Normal, s/p capsule deployment   [place] N/A 11/25/2020    Surgeon: Daneil Dolin, MD; 4/nonbleeding AVMs in the proximal small bowel large fresh blood, 2 bleeding lymphangiectasis in SB.    HOT HEMOSTASIS  11/27/2020   Procedure: HOT HEMOSTASIS (ARGON PLASMA COAGULATION/BICAP);  Surgeon: Montez Morita, Quillian Quince, MD;  [facility];  Service: Gastroenterology;;   LIPOMA RESECTION     PERIPHERAL VASCULAR INTERVENTION Right 12/13/2020   Procedure: PERIPHERAL VASCULAR INTERVENTION;  Surgeon: Serafina Mitchell, MD;  Location: [place] [facility];  Service: Cardiovascular;  Laterality: Right;  SFA/PT/AT   POLYPECTOMY  03/11/2020   Procedure: POLYPECTOMY;  Surgeon: Danie Binder, MD;  Location: [facility];  Service: Endoscopy;;  cecal, transverse, descending, sigmoid   TEE WITHOUT CARDIOVERSION N/A 05/10/2021   Procedure: TRANSESOPHAGEAL ECHOCARDIOGRAM (TEE) WATCHMEN EVALUATION;  Surgeon: Josue Hector, MD;  Location: [facility] ENDOSCOPY;  Service: Cardiovascular;  Laterality: N/A;    SOCIAL HISTORY:  Social History   Socioeconomic History   Marital status: Single    Spouse name: Not on file   Number of children: Not on file   Years of education: Not on file   Highest education level: Not on file  Occupational History   Occupation: Retired  Tobacco Use   Smoking status: Former    Packs/day: 1.00    Years: 20.00    Total pack years: 20.00    Types: Cigarettes    Start date: 03/16/1964    Quit date: 10/07/1997    Years since quitting: 25.3   Smokeless tobacco: Never  Vaping Use   Vaping Use: Never used  Substance and Sexual Activity   Alcohol use: No    Alcohol/week: 0.0 standard drinks of alcohol   Drug use: No   Sexual activity: Yes    Partners: Male  Other Topics Concern   Not on file  Social  History Narrative   Not on file   Social Determinants of Health   Financial Resource Strain: Not on file  Food Insecurity: Not on file  Transportation Needs: Not on file  Physical Activity: Not on file  Stress: Not on file  Social Connections: Not on file  Intimate Partner Violence: Not on file    FAMILY HISTORY:  Family History  Problem Relation Age of Onset   Stroke Mother  Dementia Father    Cancer - Other Sister    Atrial fibrillation Sister    Heart failure Sister    Colon cancer Maternal Grandmother        diagnosed in her 1s    CURRENT MEDICATIONS:  Outpatient Encounter Medications as of 01/29/2023  Medication Sig   acetaminophen (TYLENOL) 500 MG tablet Take 1,000 mg by mouth every 6 (six) hours as needed for moderate pain.   albuterol (VENTOLIN HFA) 108 (90 Base) MCG/ACT inhaler Inhale 2 puffs into the lungs every 6 (six) hours as needed for wheezing or shortness of breath.   apixaban (ELIQUIS) 5 MG TABS tablet Take 1 tablet by mouth 2 (two) times daily.   apixaban (ELIQUIS) 5 MG TABS tablet TAKE 1 TABLET BY MOUTH TWICE A DAY   calcitRIOL (ROCALTROL) 0.25 MCG capsule Take 0.25 mcg by mouth 3 (three) times a week. Monday,Wednesday and Fridays   Cholecalciferol (VITAMIN D-3) 25 MCG (1000 UT) CAPS Take 1,000 Units by mouth daily.   diclofenac Sodium (VOLTAREN) 1 % GEL Apply 1 application topically 4 (four) times daily as needed (pain).   flecainide (TAMBOCOR) 50 MG tablet TAKE 1 TABLET BY MOUTH TWICE A DAY   furosemide (LASIX) 40 MG tablet TAKE 1 TABLET BY MOUTH EVERY DAY   gabapentin (NEURONTIN) 100 MG capsule Take 1 capsule (100 mg total) by mouth 3 (three) times daily.   hydrocortisone (ANUSOL-HC) 2.5 % rectal cream PLACE 1 APPLICATION RECTALLY 2 (TWO) TIMES DAILY.   losartan (COZAAR) 50 MG tablet TAKE 1 TABLET BY MOUTH EVERY DAY   lubiprostone (AMITIZA) 24 MCG capsule TAKE 1 CAPSULE BY MOUTH EVERY MORNING AND AT BEDTIME   octreotide (SANDOSTATIN) 100 MCG/ML SOLN  injection Inject 1 mL (100 mcg total) into the skin every 12 (twelve) hours.   ondansetron (ZOFRAN) 4 MG tablet Take 1 tablet (4 mg total) by mouth every 6 (six) hours as needed for nausea.   pantoprazole (PROTONIX) 40 MG tablet Take 1 tablet (40 mg total) by mouth daily.   polyethylene glycol (MIRALAX / GLYCOLAX) 17 g packet Take 17 g by mouth daily as needed for mild constipation.   potassium chloride SA (KLOR-CON) 20 MEQ tablet Take 20 mEq by mouth in the morning, at noon, and at bedtime.   predniSONE (DELTASONE) 10 MG tablet Take 1 tablet (10 mg total) by mouth 3 (three) times daily.   rosuvastatin (CRESTOR) 10 MG tablet TAKE 1 TABLET BY MOUTH EVERY DAY   tiZANidine (ZANAFLEX) 4 MG tablet Take 1 tablet (4 mg total) by mouth every 6 (six) hours as needed for muscle spasms.   vitamin C (ASCORBIC ACID) 250 MG tablet Take 250 mg by mouth daily.   No facility-administered encounter medications on file as of 01/29/2023.    ALLERGIES:  Allergies  Allergen Reactions   Acetaminophen-Codeine Hives, Itching and Rash   Amlodipine     Felt she retained fluid in her chest    Amoxicillin Hives    Did it involve swelling of the face/tongue/throat, SOB, or low BP? No Did it involve sudden or severe rash/hives, skin peeling, or any reaction on the inside of your mouth or nose? No Did you need to seek medical attention at a hospital or doctor's office? No When did it last happen?      10 + years If all above answers are "NO", may proceed with cephalosporin use.    Doxycycline     " sick and weak"   Allopurinol Hives and Rash  Tramadol Hives and Rash    LABORATORY DATA:  I have reviewed the labs as listed.  CBC    Component Value Date/Time   WBC 11.7 (H) 01/02/2023 1106   RBC 4.28 01/02/2023 1106   HGB 12.4 01/02/2023 1106   HCT 40.4 01/02/2023 1106   PLT 270 01/02/2023 1106   MCV 94.4 01/02/2023 1106   MCH 29.0 01/02/2023 1106   MCHC 30.7 01/02/2023 1106   RDW 16.2 (H) 01/02/2023 1106    LYMPHSABS 2.1 09/11/2022 0911   MONOABS 0.5 09/11/2022 0911   EOSABS 0.3 09/11/2022 0911   BASOSABS 0.1 09/11/2022 0911      Latest Ref Rng & Units 01/02/2023   11:18 AM 11/06/2022   12:55 PM 10/23/2022    1:15 PM  CMP  Glucose 70 - 99 mg/dL 146  107  93   BUN 8 - 23 mg/dL 35  21  20   Creatinine 0.44 - 1.00 mg/dL 1.75  1.89  1.92   Sodium 135 - 145 mmol/L 135  137  137   Potassium 3.5 - 5.1 mmol/L 3.7  4.6  4.5   Chloride 98 - 111 mmol/L 97  102  101   CO2 22 - 32 mmol/L '27  29  28   '$ Calcium 8.9 - 10.3 mg/dL 8.8  9.0  9.0   Total Protein 6.5 - 8.1 g/dL  7.5    Total Bilirubin 0.3 - 1.2 mg/dL  0.6    Alkaline Phos 38 - 126 U/L  80    AST 15 - 41 U/L  22    ALT 0 - 44 U/L  19      DIAGNOSTIC IMAGING:  I have independently reviewed the relevant imaging and discussed with the patient.   WRAP UP:  All questions were answered. The patient knows to call the clinic with any problems, questions or concerns.  Medical decision making: Low  Time spent on visit: I spent 20 minutes counseling the patient face to face. The total time spent in the appointment was 30 minutes and more than 50% was on counseling.  Harriett Rush, PA-C  01/29/23 10:58 AM

## 2023-01-29 ENCOUNTER — Inpatient Hospital Stay: Payer: Medicare HMO

## 2023-01-29 ENCOUNTER — Inpatient Hospital Stay: Payer: Medicare HMO | Attending: Hematology | Admitting: Physician Assistant

## 2023-01-29 VITALS — BP 131/79 | HR 77 | Temp 97.8°F | Resp 16 | Ht 62.0 in | Wt 177.7 lb

## 2023-01-29 DIAGNOSIS — G4733 Obstructive sleep apnea (adult) (pediatric): Secondary | ICD-10-CM | POA: Insufficient documentation

## 2023-01-29 DIAGNOSIS — D500 Iron deficiency anemia secondary to blood loss (chronic): Secondary | ICD-10-CM

## 2023-01-29 DIAGNOSIS — Z79899 Other long term (current) drug therapy: Secondary | ICD-10-CM | POA: Insufficient documentation

## 2023-01-29 DIAGNOSIS — Z803 Family history of malignant neoplasm of breast: Secondary | ICD-10-CM | POA: Insufficient documentation

## 2023-01-29 DIAGNOSIS — I130 Hypertensive heart and chronic kidney disease with heart failure and stage 1 through stage 4 chronic kidney disease, or unspecified chronic kidney disease: Secondary | ICD-10-CM | POA: Insufficient documentation

## 2023-01-29 DIAGNOSIS — D631 Anemia in chronic kidney disease: Secondary | ICD-10-CM

## 2023-01-29 DIAGNOSIS — Z87891 Personal history of nicotine dependence: Secondary | ICD-10-CM | POA: Insufficient documentation

## 2023-01-29 DIAGNOSIS — Z8711 Personal history of peptic ulcer disease: Secondary | ICD-10-CM | POA: Diagnosis not present

## 2023-01-29 DIAGNOSIS — K219 Gastro-esophageal reflux disease without esophagitis: Secondary | ICD-10-CM | POA: Diagnosis not present

## 2023-01-29 DIAGNOSIS — D649 Anemia, unspecified: Secondary | ICD-10-CM

## 2023-01-29 DIAGNOSIS — D509 Iron deficiency anemia, unspecified: Secondary | ICD-10-CM | POA: Insufficient documentation

## 2023-01-29 DIAGNOSIS — N2581 Secondary hyperparathyroidism of renal origin: Secondary | ICD-10-CM | POA: Insufficient documentation

## 2023-01-29 DIAGNOSIS — I5032 Chronic diastolic (congestive) heart failure: Secondary | ICD-10-CM | POA: Diagnosis not present

## 2023-01-29 DIAGNOSIS — J449 Chronic obstructive pulmonary disease, unspecified: Secondary | ICD-10-CM | POA: Diagnosis not present

## 2023-01-29 DIAGNOSIS — I480 Paroxysmal atrial fibrillation: Secondary | ICD-10-CM | POA: Insufficient documentation

## 2023-01-29 DIAGNOSIS — N184 Chronic kidney disease, stage 4 (severe): Secondary | ICD-10-CM

## 2023-01-29 LAB — CBC WITH DIFFERENTIAL/PLATELET
Abs Immature Granulocytes: 0.01 10*3/uL (ref 0.00–0.07)
Basophils Absolute: 0 10*3/uL (ref 0.0–0.1)
Basophils Relative: 0 %
Eosinophils Absolute: 0.1 10*3/uL (ref 0.0–0.5)
Eosinophils Relative: 3 %
HCT: 35.9 % — ABNORMAL LOW (ref 36.0–46.0)
Hemoglobin: 11 g/dL — ABNORMAL LOW (ref 12.0–15.0)
Immature Granulocytes: 0 %
Lymphocytes Relative: 37 %
Lymphs Abs: 1.8 10*3/uL (ref 0.7–4.0)
MCH: 29.4 pg (ref 26.0–34.0)
MCHC: 30.6 g/dL (ref 30.0–36.0)
MCV: 96 fL (ref 80.0–100.0)
Monocytes Absolute: 0.4 10*3/uL (ref 0.1–1.0)
Monocytes Relative: 8 %
Neutro Abs: 2.6 10*3/uL (ref 1.7–7.7)
Neutrophils Relative %: 52 %
Platelets: 285 10*3/uL (ref 150–400)
RBC: 3.74 MIL/uL — ABNORMAL LOW (ref 3.87–5.11)
RDW: 14.9 % (ref 11.5–15.5)
WBC: 5 10*3/uL (ref 4.0–10.5)
nRBC: 0 % (ref 0.0–0.2)

## 2023-01-29 LAB — FERRITIN: Ferritin: 170 ng/mL (ref 11–307)

## 2023-01-29 LAB — COMPREHENSIVE METABOLIC PANEL
ALT: 14 U/L (ref 0–44)
AST: 23 U/L (ref 15–41)
Albumin: 3.9 g/dL (ref 3.5–5.0)
Alkaline Phosphatase: 88 U/L (ref 38–126)
Anion gap: 11 (ref 5–15)
BUN: 23 mg/dL (ref 8–23)
CO2: 30 mmol/L (ref 22–32)
Calcium: 9.3 mg/dL (ref 8.9–10.3)
Chloride: 99 mmol/L (ref 98–111)
Creatinine, Ser: 1.73 mg/dL — ABNORMAL HIGH (ref 0.44–1.00)
GFR, Estimated: 30 mL/min — ABNORMAL LOW (ref >60)
Glucose, Bld: 133 mg/dL — ABNORMAL HIGH (ref 70–99)
Potassium: 4.1 mmol/L (ref 3.5–5.1)
Sodium: 140 mmol/L (ref 135–145)
Total Bilirubin: 0.7 mg/dL (ref 0.3–1.2)
Total Protein: 7.1 g/dL (ref 6.5–8.1)

## 2023-01-29 LAB — IRON AND TIBC
Iron: 65 ug/dL (ref 28–170)
Saturation Ratios: 18 % (ref 10.4–31.8)
TIBC: 353 ug/dL (ref 250–450)
UIBC: 288 ug/dL

## 2023-01-29 NOTE — Patient Instructions (Addendum)
[place] at [facility] Discharge Instructions  You were seen today by Tarri Abernethy PA-C for your anemia.  Your anemia is most secondary to your chronic kidney disease and iron deficiency from your history of GI bleeding.  Your iron levels are satisfactory.  You do not need any IV iron at this time.  We will continue to treat your anemia of chronic kidney disease with Retacrit injections, but we will only give these once every 6 weeks (instead of every 2 weeks).  We will recheck labs and see you for office visit in 3 months.  Continue to follow-up with Dr. Theador Hawthorne for your chronic kidney disease, and Dr. Jenetta Downer & [facility] gastro[facility]ology for your chronic GI bleeding.  ** Thank you for trusting me with your healthcare!  I strive to provide all of my patients with quality care at each visit.  If you receive a survey for this visit, I would be so grateful to you for taking the time to provide feedback.  Thank you in advance!  ~ Naraly Fritcher                   Dr. Derek Jack   &   Tarri Abernethy, PA-C   - - - - - - - - - - - - - - - - - -     Thank you for choosing [place] at [facility] to provide your oncology and hematology care.  To afford each patient quality time with our provider, please arrive at least 15 minutes before your scheduled appointment time.   If you have a lab appointment with the [facility] please come in thru the Main Entrance and check in at the main information desk.  You need to re-schedule your appointment should you arrive 10 or more minutes late.  We strive to give you quality time with our providers, and arriving late affects you and other patients whose appointments are after yours.  Also, if you no show three or more times for appointments you may be dismissed from the clinic at the providers discretion.     Again, thank you for choosing [facility].  Our hope is that these requests  will decrease the amount of time that you wait before being seen by our physicians.       _____________________________________________________________  Should you have questions after your visit to [facility], please contact our office at [phone] and follow the prompts.  Our office hours are 8:00 a.m. and 4:30 p.m. Monday - Friday.  Please note that voicemails left after 4:00 p.m. may not be returned until the following business day.  We are closed weekends and major holidays.  You do have access to a nurse 24-7, just call the main number to the clinic [phone] and do not press any options, hold on the line and a nurse will answer the phone.    For prescription refill requests, have your pharmacy contact our office and allow 72 hours.    Due to Covid, you will need to wear a mask upon entering the hospital. If you do not have a mask, a mask will be given to you at the Main Entrance upon arrival. For doctor visits, patients may have 1 support person age 37 or older with them. For treatment visits, patients can not have anyone with them due to social distancing guidelines and our immunocompromised population.

## 2023-01-29 NOTE — Progress Notes (Signed)
Patient presents today for possible epogen injection, Hgb 11.0, no injection needed today, return as scheduled.

## 2023-01-30 ENCOUNTER — Other Ambulatory Visit: Payer: Self-pay

## 2023-01-30 DIAGNOSIS — D500 Iron deficiency anemia secondary to blood loss (chronic): Secondary | ICD-10-CM

## 2023-01-30 DIAGNOSIS — D631 Anemia in chronic kidney disease: Secondary | ICD-10-CM

## 2023-01-30 DIAGNOSIS — D649 Anemia, unspecified: Secondary | ICD-10-CM

## 2023-01-31 ENCOUNTER — Telehealth: Payer: Self-pay | Admitting: *Deleted

## 2023-01-31 ENCOUNTER — Ambulatory Visit (HOSPITAL_COMMUNITY): Payer: Medicare HMO | Admitting: Physical Therapy

## 2023-01-31 DIAGNOSIS — M5459 Other low back pain: Secondary | ICD-10-CM | POA: Diagnosis not present

## 2023-01-31 DIAGNOSIS — M25551 Pain in right hip: Secondary | ICD-10-CM

## 2023-01-31 NOTE — Telephone Encounter (Signed)
Received approval for Octreotide from 12/24/2022 - 12/24/2023. Sent copy to scan center.

## 2023-01-31 NOTE — Therapy (Signed)
OUTPATIENT PHYSICAL THERAPY THORACOLUMBAR EVALUATION   Patient Name: [name] [name] MRN: [id number] DOB:[birth year], 77 y.o., female Today's Date: 01/31/2023  END OF SESSION:  PT End of Session - 01/31/23 0940    Visit Number 3    Number of Visits 16    Date for PT Re-Evaluation 03/19/23    Authorization Type [employer] (no auth and no visit limit)    Progress Note Due on Visit 10    PT Start Time 0900    PT Stop Time 0940    PT Time Calculation (min) 40 min    Activity Tolerance Patient limited by fatigue;Patient tolerated treatment well             Past Medical History:  Diagnosis Date   Anemia    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[place]) 09/11/2022   Chronic kidney disease    Constipation 07/17/2017   GERD (gastroesophageal reflux disease)    Gout    Grade II diastolic dysfunction 00/08/2329   Hyperlipidemia    Hypertension    Hypokalemia    OSA (obstructive sleep apnea) 08/09/2020   PAF (paroxysmal atrial fibrillation) (HCC)    PAF (paroxysmal atrial fibrillation) (HCC)    PUD (peptic ulcer disease)    Syncope    Neurocardiogenic   Past Surgical History:  Procedure Laterality Date   ABDOMINAL AORTOGRAM W/LOWER EXTREMITY Right 12/13/2020   Procedure: ABDOMINAL AORTOGRAM W/LOWER EXTREMITY;  Surgeon: Serafina Mitchell, MD;  Location: [facility];  Service: Cardiovascular;  Laterality: Right;   ABDOMINAL HYSTERECTOMY     AMPUTATION Right 12/14/2020   Procedure: Right fifth toe amputation;  Surgeon: Rosetta Posner, MD;  Location: [facility];  Service: Vascular;  Laterality: Right;   CATARACT EXTRACTION Bilateral    COLONOSCOPY N/A 10/07/2014   Dr. Oneida Alar: redundant left colon, moderate sized external hemorrhoids    COLONOSCOPY N/A 03/11/2020   Surgeon: Danie Binder, MD;  9 polyps, majority of which were tubular adenomas, nodular mucosa in the anus s/p biopsy which was benign.   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    HOT HEMOSTASIS  11/27/2020   Procedure: HOT HEMOSTASIS (ARGON PLASMA COAGULATION/BICAP);  Surgeon: Montez Morita, Quillian Quince, MD;  [facility];  Service: Gastroenterology;;   LIPOMA RESECTION     PERIPHERAL VASCULAR INTERVENTION Right 12/13/2020   Procedure: PERIPHERAL VASCULAR INTERVENTION;  Surgeon: Serafina Mitchell, MD;  Location: [facility];  Service: Cardiovascular;  Laterality: Right;  SFA/PT/AT   POLYPECTOMY  03/11/2020   Procedure: POLYPECTOMY;  Surgeon: Danie Binder, MD;  [facility];  Service: Endoscopy;;  cecal, transverse, descending, sigmoid   TEE WITHOUT CARDIOVERSION N/A 05/10/2021   Procedure: TRANSESOPHAGEAL ECHOCARDIOGRAM (TEE) WATCHMEN EVALUATION;  Surgeon: Josue Hector, MD;  Location: [facility];  Service: Cardiovascular;  Laterality: N/A;   Patient Active Problem List   Diagnosis Date Noted   At high risk for falls 12/25/2022   Atrial fibrillation ([place]) 12/25/2022   Pulmonary HTN ([place]) 10/03/2022   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[place])  09/11/2022   Dyspnea 06/21/2022   CKD (chronic kidney disease) stage 4, GFR 15-29 ml/min (HCC) 06/21/2022   IBS (irritable bowel syndrome) 05/30/2022   Arteriovenous malformation of small intestine 03/16/2021   AVM (arteriovenous malformation) of small bowel, 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/2021   Upper GI bleed    Dry gangrene ([place])    GI bleed 12/05/2020   Acute blood loss anemia 11/24/2020   Heme positive stool    Iron deficiency anemia due to chronic blood loss    Symptomatic  anemia 11/23/2020   Grade II diastolic dysfunction 37/16/9678   Hyponatremia 11/23/2020   Symptomatic bradycardia 10/20/2020   AKI (acute kidney injury) ([place]) 10/20/2020   Microcytic anemia 10/20/2020   Amnesia 09/28/2020   Chronic obstructive pulmonary disease ([place]) 09/28/2020   Hirsutism 09/28/2020   Loss of appetite 09/28/2020   Periodontal disease 09/28/2020   Vitamin D deficiency 09/28/2020   OSA (obstructive sleep apnea) 08/09/2020   Paroxysmal atrial fibrillation ([place]) 06/15/2020   Bradycardia with 31-40 beats per minute 06/15/2020   Snoring 06/15/2020   Intermittent palpitations 06/15/2020   Near syncope 06/15/2020   Essential hypertension 06/15/2020   Atrial fibrillation with RVR ([place]) 05/04/2020   Hyperlipidemia    Gout    Encounter for screening colonoscopy    Hemorrhoid 12/16/2018   GERD (gastroesophageal reflux disease) 01/29/2018   Constipation 07/17/2017   Rectal bleeding 01/30/2017   Chest pain 12/09/2013   Hypokalemia 05/01/2012   Renal insufficiency 05/01/2012   Essential hypertension, benign 05/03/2010   VASOVAGAL SYNCOPE 05/03/2010    PCP: Andres Shad, MD   REFERRING PROVIDER: Carole Civil, MD  REFERRING DIAG: M54.50 (ICD-10-CM) - Lumbar pain M54.10 (ICD-10-CM) - Radicular pain of right lower extremity  Rationale for Evaluation and Treatment: Rehabilitation  THERAPY DIAG:  Pain in right hip  Other low back pain  ONSET DATE: 1.5 months ago  SUBJECTIVE:                                                                                                                                                                                           SUBJECTIVE STATEMENT: Pt states that she is getting her exercises done at least once a day.  She is not having much pain today.  PERTINENT HISTORY:  A. Fib. arthritis  PAIN:  Are you having pain? Yes: NPRS scale: 2/10 Pain location: B hips (R>L) Pain description: aching Aggravating factors:  walking, standing Relieving factors: sitting, resting  PRECAUTIONS: Other: bleeding issues due to Eliquis  WEIGHT BEARING RESTRICTIONS: No  FALLS:  Has patient fallen in last 6 months? No  LIVING ENVIRONMENT: Lives with: lives  with their family and lives alone Lives in: House/apartment Stairs: Yes: Internal: 2 steps; on right going up Has following equipment at home: Walker - 2 wheeled  PATIENT GOALS: patient wants to be relieved of pain.  NEXT MD VISIT: February 2024  OBJECTIVE:   DIAGNOSTIC FINDINGS:  12/26/22 Lumbar X-ray  degenerative disc disease at L4-5 multilevels of spondylosis lumbar spine   12/13/22 B hip x-ray IMPRESSION: 1. 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 2. Moderate superolateral left acetabular degenerative osteophytosis.   PATIENT SURVEYS:  FOTO 57.33   MUSCLE LENGTH: Mild tightness on B hamstrings Moderate tightness on B hip flexors and piriformis  POSTURE: anterior pelvic tilt, B medial malleoli are level in supine  LUMBAR ROM:   AROM eval  Flexion 100%  Extension 25% with slight low back pain  Right lateral flexion   Left lateral flexion   Right rotation 50%  Left rotation 50%   (Blank rows = not tested)  LOWER EXTREMITY ROM:   WFL   LOWER EXTREMITY MMT:    MMT Right eval Left eval  Hip flexion 3+ 4-  Hip extension 3+ 3+  Hip abduction 3+ 4-  Hip adduction 3+ 4-  Hip internal rotation    Hip external rotation    Knee flexion 4 4  Knee extension 4 4  Ankle dorsiflexion 5 5  Ankle plantarflexion 5 5  Ankle inversion    Ankle eversion     (Blank rows = not tested)  LUMBAR SPECIAL TESTS:  Straight leg raise test: Negative, Slump test: Negative, FABER test: Positive, and Thomas test: Positive H & I test: no pain  FUNCTIONAL TESTS:  2 minute walk test: 208 ft  GAIT: Distance walked: 208 ft Assistive device utilized: None Level of assistance: Complete Independence Comments: slightly antalgic,  little to no unsteadiness seen  TODAY'S TREATMENT:                                                                                                                              DATE:  01/31/2023 Standing: Heel raises x 10 Functional squat x 10 Wall arch 10 Sitting: Piriformis stretch 3 x 30" B  Sit to stand: 10 Sit to supine x 3 Supine: Bridge x 15 Dead bug x 10  Side lying Clam x 15 Prone: POE: X 1 minute with deep breathing   01/24/2023 Review of HEP and goals Seated: Hamstring stretch 3 x 20" each Piriformis stretch 3 x 20" each  Supine: LTR x 10 Abdominal bracing 5" hold x 10 Bridge x 10 Abdominal bracing with marching x 10  Sidelying clam 2 x 10 each  Prone lying x 2' (pain goes away completely)   01/22/23  Evaluation performed and patient education Seated: hamstring stretch x 30" x 3  Piriformis stretch x 30"   PATIENT EDUCATION:  Education details: Educated on the pathoanatomy of low back pain. Educated on the goals and course of rehab. Written HEP provided and reviewed  Person educated: Patient Education method: Explanation and Demonstration Education comprehension: verbalized understanding  HOME EXERCISE PROGRAM:             01/31/2023 Access Code: [place] with Counter Support  - 2 x daily - 7 x weekly - 1 sets - 10 reps - 3-5" hold - Mini Squat  - 2 x daily - 7 x weekly - 1 sets - 10 reps - 3-5" hold - Sit to Stand  - 2 x daily - 7 x weekly - 1 sets - 10 reps - 3-5" hold - Static Prone on Elbows  - 2 x daily - 7 x weekly - 2 reps - 60" hold AccAccess Code: [email or web address] URL: [email or web address] Date: 01/24/2023 Prepared by: [facility] - Re[facility]  Exercises - Seated Hamstring Stretch  - 1-2 x daily - 7 x weekly - 3 reps - 30 hold - Seated Piriformis Stretch  - 1-2 x daily - 7 x weekly - 3 sets - 30 reps - Supine Lower Trunk Rotation  - 1-2 x daily - 7 x weekly - 1 sets - 10 reps - Supine Transversus Abdominis Bracing - Hands on  Stomach  - 1-2 x daily - 7 x weekly - 1 sets - 10 reps - 5 ec hold - Supine Bridge  - 1-2 x daily - 7 x weekly - 1 sets - 10 reps - Supine March  - 1-2 x daily - 7 x weekly - 1 sets - 10 reps - Clamshell  - 1-2 x daily - 7 x weekly - 2 sets - 10 repsess Code: [email or web address] URL: [email or web address] Date: 01/22/2023 Prepared by: Rexene Alberts  Exercises - Seated Hamstring Stretch  - 1-2 x daily - 7 x weekly - 3 reps - 30 hold - Seated Piriformis Stretch  - 1-2 x daily - 7 x weekly - 3 sets - 30 reps CLINICAL IMPRESSION:  ASSESSMENT: Therapist educated pt on proper body mechanics for getting in and out of bed.  Added standing stability exercises emphasizing to use the functional squat when getting items out of lower spaces.  PT with good form for exercises after multiple cuing.  Pt will continue to benefit from skilled therapy to improve LE and core strength as well as education in body mechanics.   OBJECTIVE IMPAIRMENTS: difficulty walking, decreased ROM, decreased strength, impaired flexibility, and pain.   ACTIVITY LIMITATIONS: standing and walking  PARTICIPATION LIMITATIONS: community activity  PERSONAL FACTORS: Age and 1-2 comorbidities:    are also affecting patient's functional outcome.   REHAB POTENTIAL: Good  CLINICAL DECISION MAKING: Stable/uncomplicated  EVALUATION COMPLEXITY: Moderate   GOALS: Goals reviewed with patient? Yes  SHORT TERM GOALS: Target date: 01/19/23  Ptwill demonstrate indep in HEP to facilitate carry-over of skilled services and improve functional outcomes Goal status: IN PROGRESS  2.  Pt will report decrease in pain to 4 to facilitate ease in ADLs Baseline: 8/10 Goal status: IN PROGRESS  3.  Pt will demonstrate improvement of lumbar extension by 25% to facilitate ease in ADLs Baseline: 25% Goal status: IN PROGRESS  4.  Pt will demonstrate increase in LE strength to 4/5 to facilitate ease and safety in ambulation  Baseline:  3+ Goal status: IN PROGRESS  LONG TERM GOALS: Target date: 03/19/23  Pt will increase FOTO to at least 64 in order to demonstrate significant improvement in function related to walking and standing Baseline: 57.33 Goal status: IN PROGRESS  2.  Patient will be able to walk and stand  for prolonged periods with slight to little pain (2/10) Goal status: IN PROGRESS    PLAN:  PT FREQUENCY: 2x/week  PT DURATION: 8 weeks  PLANNED INTERVENTIONS: Therapeutic exercises, Therapeutic activity, Neuromuscular re-education, Gait training, Patient/Family education, Self Care, and Manual therapy.  PLAN FOR NEXT SESSION: progress LE and core strengthening as well as ROM exercises; sees MD 02/01/23; continue extension progression Rayetta Humphrey, PT CLT [phone]

## 2023-02-05 ENCOUNTER — Encounter (HOSPITAL_COMMUNITY): Payer: Medicare HMO

## 2023-02-08 ENCOUNTER — Inpatient Hospital Stay: Payer: Medicare HMO

## 2023-02-08 ENCOUNTER — Other Ambulatory Visit: Payer: Self-pay

## 2023-02-08 DIAGNOSIS — I130 Hypertensive heart and chronic kidney disease with heart failure and stage 1 through stage 4 chronic kidney disease, or unspecified chronic kidney disease: Secondary | ICD-10-CM | POA: Diagnosis not present

## 2023-02-08 DIAGNOSIS — D500 Iron deficiency anemia secondary to blood loss (chronic): Secondary | ICD-10-CM

## 2023-02-08 LAB — CBC WITH DIFFERENTIAL/PLATELET
Abs Immature Granulocytes: 0.01 10*3/uL (ref 0.00–0.07)
Basophils Absolute: 0 10*3/uL (ref 0.0–0.1)
Basophils Relative: 1 %
Eosinophils Absolute: 0.1 10*3/uL (ref 0.0–0.5)
Eosinophils Relative: 2 %
HCT: 34.9 % — ABNORMAL LOW (ref 36.0–46.0)
Hemoglobin: 10.7 g/dL — ABNORMAL LOW (ref 12.0–15.0)
Immature Granulocytes: 0 %
Lymphocytes Relative: 34 %
Lymphs Abs: 2.1 10*3/uL (ref 0.7–4.0)
MCH: 29.5 pg (ref 26.0–34.0)
MCHC: 30.7 g/dL (ref 30.0–36.0)
MCV: 96.1 fL (ref 80.0–100.0)
Monocytes Absolute: 0.6 10*3/uL (ref 0.1–1.0)
Monocytes Relative: 9 %
Neutro Abs: 3.2 10*3/uL (ref 1.7–7.7)
Neutrophils Relative %: 54 %
Platelets: 391 10*3/uL (ref 150–400)
RBC: 3.63 MIL/uL — ABNORMAL LOW (ref 3.87–5.11)
RDW: 14.5 % (ref 11.5–15.5)
WBC: 6 10*3/uL (ref 4.0–10.5)
nRBC: 0 % (ref 0.0–0.2)

## 2023-02-08 LAB — COMPREHENSIVE METABOLIC PANEL
ALT: 11 U/L (ref 0–44)
AST: 23 U/L (ref 15–41)
Albumin: 3.9 g/dL (ref 3.5–5.0)
Alkaline Phosphatase: 82 U/L (ref 38–126)
Anion gap: 11 (ref 5–15)
BUN: 23 mg/dL (ref 8–23)
CO2: 30 mmol/L (ref 22–32)
Calcium: 9 mg/dL (ref 8.9–10.3)
Chloride: 98 mmol/L (ref 98–111)
Creatinine, Ser: 1.55 mg/dL — ABNORMAL HIGH (ref 0.44–1.00)
GFR, Estimated: 35 mL/min — ABNORMAL LOW (ref >60)
Glucose, Bld: 101 mg/dL — ABNORMAL HIGH (ref 70–99)
Potassium: 4.4 mmol/L (ref 3.5–5.1)
Sodium: 139 mmol/L (ref 135–145)
Total Bilirubin: 0.7 mg/dL (ref 0.3–1.2)
Total Protein: 7 g/dL (ref 6.5–8.1)

## 2023-02-08 LAB — IRON AND TIBC
Iron: 105 ug/dL (ref 28–170)
Saturation Ratios: 29 % (ref 10.4–31.8)
TIBC: 361 ug/dL (ref 250–450)
UIBC: 256 ug/dL

## 2023-02-08 LAB — FERRITIN: Ferritin: 120 ng/mL (ref 11–307)

## 2023-02-08 NOTE — Progress Notes (Signed)
Per today's triage nurse, patient called to report that she is feeling weaker and is wanting to know if she can have labs sooner that her appointment 03/12/23.  We will have her come in today for lab check (CBC/D, CMP, ferritin, iron/TIBC), further plan pending lab results.

## 2023-02-11 ENCOUNTER — Telehealth: Payer: Self-pay | Admitting: Orthopedic Surgery

## 2023-02-11 NOTE — Telephone Encounter (Signed)
Patient called and she stated she is having weakness.  She stated she isn't going to PT tomorrow because of it.  She has questions about PT, she would like a call.  [phone]

## 2023-02-12 ENCOUNTER — Encounter (HOSPITAL_COMMUNITY): Payer: Medicare HMO

## 2023-02-12 ENCOUNTER — Inpatient Hospital Stay: Payer: Medicare HMO

## 2023-02-12 VITALS — BP 146/50 | HR 78 | Temp 97.5°F | Resp 18

## 2023-02-12 DIAGNOSIS — D500 Iron deficiency anemia secondary to blood loss (chronic): Secondary | ICD-10-CM

## 2023-02-12 DIAGNOSIS — I130 Hypertensive heart and chronic kidney disease with heart failure and stage 1 through stage 4 chronic kidney disease, or unspecified chronic kidney disease: Secondary | ICD-10-CM | POA: Diagnosis not present

## 2023-02-12 DIAGNOSIS — N184 Chronic kidney disease, stage 4 (severe): Secondary | ICD-10-CM

## 2023-02-12 MED ORDER — EPOETIN ALFA 10000 UNIT/ML IJ SOLN
10000.0000 [IU] | Freq: Once | INTRAMUSCULAR | Status: AC
  Administered 2023-02-12: 10000 [IU] via INTRAVENOUS
  Filled 2023-02-12: qty 1

## 2023-02-12 NOTE — Patient Instructions (Signed)
[name] [name]  Discharge Instructions: Thank you for choosing [facility] to provide your oncology and hematology care.  If you have a lab appointment with the [facility], please come in thru the Main Entrance and check in at the main information desk.  Wear comfortable clothing and clothing appropriate for easy access to any Portacath or PICC line.   We strive to give you quality time with your provider. You may need to reschedule your appointment if you arrive late (15 or more minutes).  Arriving late affects you and other patients whose appointments are after yours.  Also, if you miss three or more appointments without notifying the office, you may be dismissed from the clinic at the provider's discretion.      For prescription refill requests, have your pharmacy contact our office and allow 72 hours for refills to be completed.    Today you received the following, your epogen injection   To help prevent nausea and vomiting after your treatment, we encourage you to take your nausea medication as directed.  BELOW ARE SYMPTOMS THAT SHOULD BE REPORTED IMMEDIATELY: *FEVER GREATER THAN 100.4 F (38 C) OR HIGHER *CHILLS OR SWEATING *NAUSEA AND VOMITING THAT IS NOT CONTROLLED WITH YOUR NAUSEA MEDICATION *UNUSUAL SHORTNESS OF BREATH *UNUSUAL BRUISING OR BLEEDING *URINARY PROBLEMS (pain or burning when urinating, or frequent urination) *BOWEL PROBLEMS (unusual diarrhea, constipation, pain near the anus) TENDERNESS IN MOUTH AND THROAT WITH OR WITHOUT PRESENCE OF ULCERS (sore throat, sores in mouth, or a toothache) UNUSUAL RASH, SWELLING OR PAIN  UNUSUAL VAGINAL DISCHARGE OR ITCHING   Items with * indicate a potential emergency and should be followed up as soon as possible or go to the Emergency Department if any problems should occur.  Please show the CHEMOTHERAPY ALERT CARD or IMMUNOTHERAPY ALERT CARD at check-in to the Emergency Department and triage  nurse.  Should you have questions after your visit or need to cancel or reschedule your appointment, please contact [facility] [phone]  and follow the prompts.  Office hours are 8:00 a.m. to 4:30 p.m. Monday - Friday. Please note that voicemails left after 4:00 p.m. may not be returned until the following business day.  We are closed weekends and major holidays. You have access to a nurse at all times for urgent questions. Please call the main number to the clinic [phone] and follow the prompts.  For any non-urgent questions, you may also contact your provider using MyChart. We now offer e-Visits for anyone 21 and older to request care online for non-urgent symptoms. For details visit [email or web address].   Also download the MyChart app! Go to the app store, search "MyChart", open the app, select , and log in with your MyChart username and password.

## 2023-02-12 NOTE — Progress Notes (Signed)
Epogen injection given per orders. Patient tolerated it well without problems. Vitals stable and discharged home from clinic ambulatory. Follow up as scheduled.

## 2023-02-13 NOTE — Telephone Encounter (Signed)
Message received from the patient that she felt weak.  She canceled her physical therapy.  I called her today and she said that whenever her blood gets low she gets weak.  She went to get an injection/infusion and she says that takes a couple of days to work.  She says that the therapy was helping.  The weakness is not related to the back or the therapy.

## 2023-02-14 ENCOUNTER — Encounter (HOSPITAL_COMMUNITY): Payer: Medicare HMO

## 2023-02-18 ENCOUNTER — Encounter (HOSPITAL_COMMUNITY): Payer: Medicare HMO | Admitting: Physical Therapy

## 2023-02-20 ENCOUNTER — Encounter (HOSPITAL_COMMUNITY): Payer: Medicare HMO

## 2023-02-21 ENCOUNTER — Encounter: Payer: Self-pay | Admitting: Radiology

## 2023-02-21 ENCOUNTER — Encounter: Payer: Self-pay | Admitting: Orthopedic Surgery

## 2023-02-21 ENCOUNTER — Ambulatory Visit: Payer: Medicare HMO | Admitting: Orthopedic Surgery

## 2023-02-21 VITALS — BP 157/61 | HR 63 | Ht 62.0 in | Wt 177.0 lb

## 2023-02-21 DIAGNOSIS — M5136 Other intervertebral disc degeneration, lumbar region: Secondary | ICD-10-CM

## 2023-02-21 DIAGNOSIS — M5410 Radiculopathy, site unspecified: Secondary | ICD-10-CM

## 2023-02-21 NOTE — Progress Notes (Signed)
FOLLOW UP   Encounter Diagnoses  Name Primary?   DDD (degenerative disc disease), lumbar Yes   Radicular pain of right lower extremity      Chief Complaint  Patient presents with   Back Pain    Feels much better     PRIOR TREATMENT: [name] [name] had a series of physical therapy gabapentin prednisone and tizanidine and is much better with only some mild residual right lower back and right lateral leg and hip pain  She question me about continuing prednisone but we decided against that as it does have its own side effects  She is on a blood thinner  We will continue with tizanidine and gabapentin and see her on an as-needed basis

## 2023-02-26 ENCOUNTER — Encounter (HOSPITAL_COMMUNITY): Payer: Medicare HMO

## 2023-02-27 ENCOUNTER — Encounter: Payer: Self-pay | Admitting: Hematology

## 2023-02-28 ENCOUNTER — Encounter (HOSPITAL_COMMUNITY): Payer: Medicare HMO

## 2023-03-04 ENCOUNTER — Encounter (HOSPITAL_COMMUNITY): Payer: Medicare HMO

## 2023-03-06 ENCOUNTER — Encounter (HOSPITAL_COMMUNITY): Payer: Medicare HMO

## 2023-03-11 ENCOUNTER — Encounter (HOSPITAL_COMMUNITY): Payer: Medicare HMO

## 2023-03-12 ENCOUNTER — Inpatient Hospital Stay: Payer: Medicare HMO

## 2023-03-13 ENCOUNTER — Encounter (HOSPITAL_COMMUNITY): Payer: Medicare HMO

## 2023-03-25 ENCOUNTER — Inpatient Hospital Stay: Payer: Medicare HMO | Attending: Hematology

## 2023-03-25 ENCOUNTER — Other Ambulatory Visit: Payer: Self-pay

## 2023-03-25 ENCOUNTER — Inpatient Hospital Stay: Payer: Medicare HMO

## 2023-03-25 VITALS — BP 138/64 | HR 64 | Temp 97.6°F | Resp 16

## 2023-03-25 DIAGNOSIS — N184 Chronic kidney disease, stage 4 (severe): Secondary | ICD-10-CM | POA: Diagnosis not present

## 2023-03-25 DIAGNOSIS — Z79899 Other long term (current) drug therapy: Secondary | ICD-10-CM | POA: Insufficient documentation

## 2023-03-25 DIAGNOSIS — I130 Hypertensive heart and chronic kidney disease with heart failure and stage 1 through stage 4 chronic kidney disease, or unspecified chronic kidney disease: Secondary | ICD-10-CM | POA: Diagnosis not present

## 2023-03-25 DIAGNOSIS — D500 Iron deficiency anemia secondary to blood loss (chronic): Secondary | ICD-10-CM

## 2023-03-25 DIAGNOSIS — D631 Anemia in chronic kidney disease: Secondary | ICD-10-CM

## 2023-03-25 DIAGNOSIS — D649 Anemia, unspecified: Secondary | ICD-10-CM

## 2023-03-25 LAB — CBC
HCT: 35.2 % — ABNORMAL LOW (ref 36.0–46.0)
Hemoglobin: 10.8 g/dL — ABNORMAL LOW (ref 12.0–15.0)
MCH: 28.8 pg (ref 26.0–34.0)
MCHC: 30.7 g/dL (ref 30.0–36.0)
MCV: 93.9 fL (ref 80.0–100.0)
Platelets: 266 10*3/uL (ref 150–400)
RBC: 3.75 MIL/uL — ABNORMAL LOW (ref 3.87–5.11)
RDW: 13.2 % (ref 11.5–15.5)
WBC: 5.2 10*3/uL (ref 4.0–10.5)
nRBC: 0 % (ref 0.0–0.2)

## 2023-03-25 MED ORDER — EPOETIN ALFA 10000 UNIT/ML IJ SOLN
10000.0000 [IU] | Freq: Once | INTRAMUSCULAR | Status: AC
  Administered 2023-03-25: 10000 [IU] via INTRAVENOUS
  Filled 2023-03-25: qty 1

## 2023-03-25 NOTE — Progress Notes (Signed)
Patient presents today for Retacrit injection. Hemoglobin reviewed prior to administration. VSS tolerated without incident or complaint. See MAR for details. Patient stable during and after injection. Patient discharged in satisfactory condition with no s/s of distress noted.  

## 2023-03-25 NOTE — Patient Instructions (Signed)
[facility]  Discharge Instructions: Thank you for choosing [facility] to provide your oncology and hematology care.  If you have a lab appointment with the Cancer Center, please come in thru the Main Entrance and check in at the main information desk.  Wear comfortable clothing and clothing appropriate for easy access to any Portacath or PICC line.   We strive to give you quality time with your provider. You may need to reschedule your appointment if you arrive late (15 or more minutes).  Arriving late affects you and other patients whose appointments are after yours.  Also, if you miss three or more appointments without notifying the office, you may be dismissed from the clinic at the provider's discretion.      For prescription refill requests, have your pharmacy contact our office and allow 72 hours for refills to be completed.    Today you received the following Retacrit, return as scheduled.   To help prevent nausea and vomiting after your treatment, we encourage you to take your nausea medication as directed.  BELOW ARE SYMPTOMS THAT SHOULD BE REPORTED IMMEDIATELY: *FEVER GREATER THAN 100.4 F (38 C) OR HIGHER *CHILLS OR SWEATING *NAUSEA AND VOMITING THAT IS NOT CONTROLLED WITH YOUR NAUSEA MEDICATION *UNUSUAL SHORTNESS OF BREATH *UNUSUAL BRUISING OR BLEEDING *URINARY PROBLEMS (pain or burning when urinating, or frequent urination) *BOWEL PROBLEMS (unusual diarrhea, constipation, pain near the anus) TENDERNESS IN MOUTH AND THROAT WITH OR WITHOUT PRESENCE OF ULCERS (sore throat, sores in mouth, or a toothache) UNUSUAL RASH, SWELLING OR PAIN  UNUSUAL VAGINAL DISCHARGE OR ITCHING   Items with * indicate a potential emergency and should be followed up as soon as possible or go to the Emergency Department if any problems should occur.  Please show the CHEMOTHERAPY ALERT CARD or IMMUNOTHERAPY ALERT CARD at check-in to the Emergency Department and  triage nurse.  Should you have questions after your visit or need to cancel or reschedule your appointment, please contact [facility] [phone]  and follow the prompts.  Office hours are 8:00 a.m. to 4:30 p.m. Monday - Friday. Please note that voicemails left after 4:00 p.m. may not be returned until the following business day.  We are closed weekends and major holidays. You have access to a nurse at all times for urgent questions. Please call the main number to the clinic [phone] and follow the prompts.  For any non-urgent questions, you may also contact your provider using MyChart. We now offer e-Visits for anyone 18 and older to request care online for non-urgent symptoms. For details visit [email or web address].   Also download the MyChart app! Go to the app store, search "MyChart", open the app, select [facility], and log in with your MyChart username and password.   

## 2023-03-26 ENCOUNTER — Inpatient Hospital Stay: Payer: Medicare HMO

## 2023-03-26 DIAGNOSIS — I130 Hypertensive heart and chronic kidney disease with heart failure and stage 1 through stage 4 chronic kidney disease, or unspecified chronic kidney disease: Secondary | ICD-10-CM | POA: Diagnosis not present

## 2023-03-26 DIAGNOSIS — D649 Anemia, unspecified: Secondary | ICD-10-CM

## 2023-03-26 DIAGNOSIS — D631 Anemia in chronic kidney disease: Secondary | ICD-10-CM

## 2023-03-26 DIAGNOSIS — D500 Iron deficiency anemia secondary to blood loss (chronic): Secondary | ICD-10-CM

## 2023-03-26 LAB — IRON AND TIBC
Iron: 58 ug/dL (ref 28–170)
Saturation Ratios: 16 % (ref 10.4–31.8)
TIBC: 369 ug/dL (ref 250–450)
UIBC: 311 ug/dL

## 2023-03-26 LAB — FERRITIN: Ferritin: 39 ng/mL (ref 11–307)

## 2023-04-22 ENCOUNTER — Inpatient Hospital Stay: Payer: Medicare HMO

## 2023-04-22 ENCOUNTER — Inpatient Hospital Stay: Payer: Medicare HMO | Admitting: Physician Assistant

## 2023-04-23 ENCOUNTER — Inpatient Hospital Stay: Payer: Medicare HMO

## 2023-04-24 ENCOUNTER — Telehealth: Payer: Self-pay | Admitting: Cardiology

## 2023-04-24 NOTE — Telephone Encounter (Signed)
Patient wants to know if it is safe for her to travel (fly) with her heart condition.

## 2023-04-24 NOTE — Telephone Encounter (Signed)
Per chart review, patient has PAF. Please advise

## 2023-04-25 NOTE — Telephone Encounter (Signed)
Pt called back per message received in [facility] Triage.    Pt is going out of town and wanted to know if it was safe for her to travel?  Pt takes Flecainide 50 mg, BID;  and takes Eliquis 5 mg, BID.  Pt has history of PAF, but has been asymptomatic on her medications.   Pt sees Dr. Lewayne Bunting, however he was out to the office this week.  Message sent to [facility], Ms.Jari Favre, PA-C, as double check to Pt request; No concerns assessed reviewing Pt med list / per telephone call.  Per Ms. Asa Lente PA-C:  "I actually didn't physically see her in Oct. We had a phone clearance conversation for her upcoming surgery. However, she sounds like she would be low risk to fly. Just make sure she brings all her medications with her."    Pt called and educated / advised to take all of her medications and some extra meds for flight delays.  Understood all education, is flying to [place], short flight in duration.  No follow up required.

## 2023-05-03 NOTE — Progress Notes (Signed)
[facility] [address], [place] [address]   CLINIC:  Medical Oncology/Hematology  PCP:  Doreatha Massed, MD [address] [place] [address] [phone]   REASON FOR VISIT:  Follow-up for normocytic anemia  CURRENT THERAPY: IV iron and Retacrit  INTERVAL HISTORY:   [name] 77 y.o. 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  She was last seen by Rojelio Brenner PA-C on 01/29/2023.  At today's visit, she reports feeling fair.  No recent hospitalizations, surgeries, or changes in baseline health status.  She has been feeling a bit more fatigued for the past week.  She has recurrent ice pica.  Her chronic dyspnea on exertion is worse than usual.  She denies any chest pain, lightheadedness, or passing out.  She reports some intermittent rectal bleeding for the past week.  She denies any melena.  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  She remains on octreotide as well as Eliquis.  She has little to no energy and 100% appetite. She endorses that she is maintaining a stable weight.  ASSESSMENT & PLAN:  1.  Iron deficiency anemia - Seen at the request of Dr. Wolfgang Phoenix for evaluation and treatment of anemia in the setting of CKD stage IV and chronic iron deficiency from GI bleeding - She has had anemia since September 2021. 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 - She takes Protonix and self administers SQ octreotide twice daily.  She is on Eliquis for atrial fibrillation (cleared by GI). 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 - Hospitalized from 06/21/2022 through 06/23/2022 for symptomatic  anemia.  Received 1 unit PRBC and IV Feraheme x500 mg during hospitalization. - Hematology work-up (07/02/2022): Mildly elevated reticulocytes 3.7%.  Normal LDH 140. CBC with Hgb at 9.7/MCV 92.2. Normal folate, copper, B12, MMA, homocystine. Negative SPEP and immunofixation.  Mildly elevated free light chain ratio in keeping with CKD stage IV. - She has intermittent bright red blood per rectum and melena, but this has resolved for the time being ever since small bowel endoscopy at [facility] in October 2023 - Failed to improve on oral iron supplementation - Epogen/Retacrit protocol initiated by nephrology in February 2022, but has not required injection since 09/25/2022. - Most recent Venofer 300 mg x3 from 11/08/2022 through 12/10/2022 - Labs today (05/06/2023): Normal Hgb 12.1, ferritin 31, iron saturation 15%.  Baseline CKD stage IIIb/IV with creatinine 1.68/GFR 31. - DIFFERENTIAL DIAGNOSIS: Since anemia resolved after IV iron repletion, suspect that her iron deficiency that iron deficiency is the main driving force in her anemia with some small impact due to her advanced CKD. - PLAN: Recommend IV Venofer 300 mg x 3.   - Continue with possible Retacrit injections EVERY 6 WEEKS. - Continue GI follow-up - Repeat CBC, CMP, and iron panel with RTC in 3 months with same-day visit   2.  Other history - PMH: CKD stage IV, secondary hyperparathyroidism, chronic diastolic CHF, atrial fibrillation (Eliquis), hypertension, GERD, peripheral arterial disease, mild COPD, and iron deficiency from GI bleeds  - SOCIAL: She is retired and lives at home alone.  She is able to ambulate short distances without difficulty and is independent with ADLs.  She is a former smoker, smokes 1 PPD for 25 years, quit in 1998.  She denies any alcohol or illicit drug use. - FAMILY: Family history positive for mother with unspecified anemia.  Her sister had breast cancer.  Maternal grandmother also had breast cancer.  PLAN  SUMMARY: >> IV Venofer 300 mg x 3 >> CBC + Possible Retacrit every 6 weeks >> Same day labs (CBC/D, CMP, ferritin, iron/TIBC) + OFFICE visit + injection in 3 months     REVIEW OF SYSTEMS:  Review of Systems  Constitutional:  Positive for fatigue. Negative for appetite change, chills, diaphoresis, fever and unexpected weight change.  HENT:   Negative for lump/mass and nosebleeds.   Eyes:  Negative for eye problems.  Respiratory:  Positive for shortness of breath (with exertion). Negative for cough and hemoptysis.   Cardiovascular:  Positive for leg swelling. Negative for chest pain and palpitations.  Gastrointestinal:  Positive for blood in stool. Negative for abdominal pain, constipation, diarrhea, nausea and vomiting.  Genitourinary:  Negative for hematuria.   Skin: Negative.   Neurological:  Positive for numbness. Negative for dizziness, headaches and light-headedness.  Hematological:  Does not bruise/bleed easily.     PHYSICAL EXAM:  ECOG PERFORMANCE STATUS: 2 - Symptomatic, <50% confined to bed  There were no vitals filed for this visit. There were no vitals filed for this visit. Physical Exam Constitutional:      Appearance: Normal appearance. She is obese.  HENT:     Head: Normocephalic and atraumatic.     Mouth/Throat:     Mouth: Mucous membranes are moist.  Eyes:     Extraocular Movements: Extraocular movements intact.     Pupils: Pupils are equal, round, and reactive to light.  Cardiovascular:     Rate and Rhythm: Normal rate and regular rhythm.     Pulses: Normal pulses.     Heart sounds: Normal heart sounds.  Pulmonary:     Effort: Pulmonary effort is normal.     Breath sounds: Normal breath sounds.  Abdominal:     General: Bowel sounds are normal.     Palpations: Abdomen is soft.     Tenderness: There is no abdominal tenderness.  Musculoskeletal:        General: No swelling.     Right lower leg: No edema.     Left lower leg: No edema.  Lymphadenopathy:      Cervical: No cervical adenopathy.  Skin:    General: Skin is warm and dry.  Neurological:     General: No focal deficit present.     Mental Status: She is alert and oriented to person, place, and time.  Psychiatric:        Mood and Affect: Mood normal.        Behavior: Behavior normal.     PAST MEDICAL/SURGICAL HISTORY:  Past Medical History:  Diagnosis Date   Anemia    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HCC) 09/11/2022   Chronic kidney disease    Constipation 07/17/2017   GERD (gastroesophageal reflux disease)    Gout    Grade II diastolic dysfunction 11/23/2020   Hyperlipidemia    Hypertension    Hypokalemia    OSA (obstructive sleep apnea) 08/09/2020   PAF (paroxysmal atrial fibrillation) (HCC)    PAF (paroxysmal atrial fibrillation) (HCC)    PUD (peptic ulcer disease)    Syncope    Neurocardiogenic   Past Surgical History:  Procedure Laterality Date   ABDOMINAL AORTOGRAM W/LOWER  EXTREMITY Right 12/13/2020   Procedure: ABDOMINAL AORTOGRAM W/LOWER EXTREMITY;  Surgeon: Nada Libman, MD;  Location: [facility];  Service: Cardiovascular;  Laterality: Right;   ABDOMINAL HYSTERECTOMY     AMPUTATION Right 12/14/2020   Procedure: Right fifth toe amputation;  Surgeon: Larina Earthly, MD;  Location: [facility] OR;  Service: Vascular;  Laterality: Right;   CATARACT EXTRACTION Bilateral    COLONOSCOPY N/A 10/07/2014   Dr. Darrick Penna: redundant left colon, moderate sized external hemorrhoids    COLONOSCOPY N/A 03/11/2020   Surgeon: West Bali, MD;  9 polyps, majority of which were tubular adenomas, nodular mucosa in the anus s/p biopsy which was benign.   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   ESOPHAGOGASTRODUODENOSCOPY (EGD) WITH PROPOFOL N/A 11/25/2020    Surgeon: Corbin Ade, MD; Normal, s/p capsule deployment   GIVENS  CAPSULE STUDY N/A 11/25/2020    Surgeon: Corbin Ade, MD; 4/nonbleeding AVMs in the proximal small bowel large fresh blood, 2 bleeding lymphangiectasis in SB.    HOT HEMOSTASIS  11/27/2020   Procedure: HOT HEMOSTASIS (ARGON PLASMA COAGULATION/BICAP);  Surgeon: Marguerita Merles, Reuel Boom, MD;  Location: [facility] ENDO SUITE;  Service: Gastroenterology;;   LIPOMA RESECTION     PERIPHERAL VASCULAR INTERVENTION Right 12/13/2020   Procedure: PERIPHERAL VASCULAR INTERVENTION;  Surgeon: Nada Libman, MD;  Location: [facility] INVASIVE CV LAB;  Service: Cardiovascular;  Laterality: Right;  SFA/PT/AT   POLYPECTOMY  03/11/2020   Procedure: POLYPECTOMY;  Surgeon: West Bali, MD;  Location: [facility] ENDO SUITE;  Service: Endoscopy;;  cecal, transverse, descending, sigmoid   TEE WITHOUT CARDIOVERSION N/A 05/10/2021   Procedure: TRANSESOPHAGEAL ECHOCARDIOGRAM (TEE) WATCHMEN EVALUATION;  Surgeon: Wendall Stade, MD;  Location: [facility] ENDOSCOPY;  Service: Cardiovascular;  Laterality: N/A;    SOCIAL HISTORY:  Social History   Socioeconomic History   Marital status: Single    Spouse name: Not on file   Number of children: Not on file   Years of education: Not on file   Highest education level: Not on file  Occupational History   Occupation: Retired  Tobacco Use   Smoking status: Former    Packs/day: 1.00    Years: 20.00    Additional pack years: 0.00    Total pack years: 20.00    Types: Cigarettes    Start date: 03/16/1964    Quit date: 10/07/1997    Years since quitting: 25.5   Smokeless tobacco: Never  Vaping Use   Vaping Use: Never used  Substance and Sexual Activity   Alcohol use: No    Alcohol/week: 0.0 standard drinks of alcohol   Drug use: No   Sexual activity: Yes    Partners: Male  Other Topics Concern   Not on file  Social History Narrative   Not on file   Social Determinants of Health   Financial Resource Strain: Not on file  Food Insecurity: Not on file  Transportation Needs: Not on file   Physical Activity: Not on file  Stress: Not on file  Social Connections: Not on file  Intimate Partner Violence: Not on file    FAMILY HISTORY:  Family History  Problem Relation Age of Onset   Stroke Mother    Dementia Father    Cancer - Other Sister    Atrial fibrillation Sister    Heart failure Sister    Colon cancer  Maternal Grandmother        diagnosed in her 104s    CURRENT MEDICATIONS:  Outpatient Encounter Medications as of 05/06/2023  Medication Sig   acetaminophen (TYLENOL) 500 MG tablet Take 1,000 mg by mouth every 6 (six) hours as needed for moderate pain.   albuterol (VENTOLIN HFA) 108 (90 Base) MCG/ACT inhaler Inhale 2 puffs into the lungs every 6 (six) hours as needed for wheezing or shortness of breath.   apixaban (ELIQUIS) 5 MG TABS tablet Take 1 tablet by mouth 2 (two) times daily.   apixaban (ELIQUIS) 5 MG TABS tablet TAKE 1 TABLET BY MOUTH TWICE A DAY   calcitRIOL (ROCALTROL) 0.25 MCG capsule Take 0.25 mcg by mouth 3 (three) times a week. Monday,Wednesday and Fridays   Cholecalciferol (VITAMIN D-3) 25 MCG (1000 UT) CAPS Take 1,000 Units by mouth daily.   cyclobenzaprine (FLEXERIL) 5 MG tablet Take 5 mg by mouth 3 (three) times daily.   diclofenac Sodium (VOLTAREN) 1 % GEL Apply 1 application topically 4 (four) times daily as needed (pain).   flecainide (TAMBOCOR) 50 MG tablet TAKE 1 TABLET BY MOUTH TWICE A DAY   furosemide (LASIX) 40 MG tablet TAKE 1 TABLET BY MOUTH EVERY DAY   gabapentin (NEURONTIN) 100 MG capsule Take 1 capsule (100 mg total) by mouth 3 (three) times daily.   hydrocortisone (ANUSOL-HC) 2.5 % rectal cream PLACE 1 APPLICATION RECTALLY 2 (TWO) TIMES DAILY.   losartan (COZAAR) 50 MG tablet TAKE 1 TABLET BY MOUTH EVERY DAY   lubiprostone (AMITIZA) 24 MCG capsule TAKE 1 CAPSULE BY MOUTH EVERY MORNING AND AT BEDTIME   octreotide (SANDOSTATIN) 100 MCG/ML SOLN injection Inject 1 mL (100 mcg total) into the skin every 12 (twelve) hours.   ondansetron  (ZOFRAN) 4 MG tablet Take 1 tablet (4 mg total) by mouth every 6 (six) hours as needed for nausea.   pantoprazole (PROTONIX) 40 MG tablet Take 1 tablet (40 mg total) by mouth daily.   polyethylene glycol (MIRALAX / GLYCOLAX) 17 g packet Take 17 g by mouth daily as needed for mild constipation.   potassium chloride SA (KLOR-CON) 20 MEQ tablet Take 20 mEq by mouth in the morning, at noon, and at bedtime.   predniSONE (DELTASONE) 10 MG tablet Take 1 tablet (10 mg total) by mouth 3 (three) times daily.   rosuvastatin (CRESTOR) 10 MG tablet TAKE 1 TABLET BY MOUTH EVERY DAY   tiZANidine (ZANAFLEX) 4 MG tablet Take 1 tablet (4 mg total) by mouth every 6 (six) hours as needed for muscle spasms.   vitamin C (ASCORBIC ACID) 250 MG tablet Take 250 mg by mouth daily.   No facility-administered encounter medications on file as of 05/06/2023.    ALLERGIES:  Allergies  Allergen Reactions   Acetaminophen-Codeine Hives, Itching and Rash   Amlodipine     Felt she retained fluid in her chest    Amoxicillin Hives    Did it involve swelling of the face/tongue/throat, SOB, or low BP? No Did it involve sudden or severe rash/hives, skin peeling, or any reaction on the inside of your mouth or nose? No Did you need to seek medical attention at a hospital or doctor's office? No When did it last happen?      10 + years If all above answers are "NO", may proceed with cephalosporin use.    Doxycycline     " sick and weak"   Allopurinol Hives and Rash   Tramadol Hives and Rash    LABORATORY  DATA:  I have reviewed the labs as listed.  CBC    Component Value Date/Time   WBC 5.2 03/25/2023 0927   RBC 3.75 (L) 03/25/2023 0927   HGB 10.8 (L) 03/25/2023 0927   HCT 35.2 (L) 03/25/2023 0927   PLT 266 03/25/2023 0927   MCV 93.9 03/25/2023 0927   MCH 28.8 03/25/2023 0927   MCHC 30.7 03/25/2023 0927   RDW 13.2 03/25/2023 0927   LYMPHSABS 2.1 02/08/2023 1503   MONOABS 0.6 02/08/2023 1503   EOSABS 0.1 02/08/2023  1503   BASOSABS 0.0 02/08/2023 1503      Latest Ref Rng & Units 02/08/2023    3:03 PM 01/29/2023    9:24 AM 01/02/2023   11:18 AM  CMP  Glucose 70 - 99 mg/dL 161  096  045   BUN 8 - 23 mg/dL 23  23  35   Creatinine 0.44 - 1.00 mg/dL 4.09  8.11  9.14   Sodium 135 - 145 mmol/L 139  140  135   Potassium 3.5 - 5.1 mmol/L 4.4  4.1  3.7   Chloride 98 - 111 mmol/L 98  99  97   CO2 22 - 32 mmol/L 30  30  27    Calcium 8.9 - 10.3 mg/dL 9.0  9.3  8.8   Total Protein 6.5 - 8.1 g/dL 7.0  7.1    Total Bilirubin 0.3 - 1.2 mg/dL 0.7  0.7    Alkaline Phos 38 - 126 U/L 82  88    AST 15 - 41 U/L 23  23    ALT 0 - 44 U/L 11  14      DIAGNOSTIC IMAGING:  I have independently reviewed the relevant imaging and discussed with the patient.   WRAP UP:  All questions were answered. The patient knows to call the clinic with any problems, questions or concerns.  Medical decision making: Low  Time spent on visit: I spent 15 minutes counseling the patient face to face. The total time spent in the appointment was 22 minutes and more than 50% was on counseling.  Carnella Guadalajara, PA-C  05/06/23 10:37 AM

## 2023-05-06 ENCOUNTER — Inpatient Hospital Stay: Payer: Medicare HMO

## 2023-05-06 ENCOUNTER — Inpatient Hospital Stay (HOSPITAL_BASED_OUTPATIENT_CLINIC_OR_DEPARTMENT_OTHER): Payer: Medicare HMO | Admitting: Physician Assistant

## 2023-05-06 ENCOUNTER — Inpatient Hospital Stay: Payer: Medicare HMO | Attending: Hematology

## 2023-05-06 ENCOUNTER — Encounter: Payer: Self-pay | Admitting: Physician Assistant

## 2023-05-06 VITALS — HR 70 | Temp 97.2°F | Resp 18 | Wt 176.4 lb

## 2023-05-06 DIAGNOSIS — Z800 Family history of malignant neoplasm of digestive organs: Secondary | ICD-10-CM | POA: Insufficient documentation

## 2023-05-06 DIAGNOSIS — D500 Iron deficiency anemia secondary to blood loss (chronic): Secondary | ICD-10-CM

## 2023-05-06 DIAGNOSIS — Z87891 Personal history of nicotine dependence: Secondary | ICD-10-CM | POA: Diagnosis not present

## 2023-05-06 DIAGNOSIS — J449 Chronic obstructive pulmonary disease, unspecified: Secondary | ICD-10-CM | POA: Diagnosis not present

## 2023-05-06 DIAGNOSIS — Z803 Family history of malignant neoplasm of breast: Secondary | ICD-10-CM | POA: Insufficient documentation

## 2023-05-06 DIAGNOSIS — N2581 Secondary hyperparathyroidism of renal origin: Secondary | ICD-10-CM | POA: Diagnosis not present

## 2023-05-06 DIAGNOSIS — D631 Anemia in chronic kidney disease: Secondary | ICD-10-CM

## 2023-05-06 DIAGNOSIS — F5089 Other specified eating disorder: Secondary | ICD-10-CM | POA: Diagnosis not present

## 2023-05-06 DIAGNOSIS — N184 Chronic kidney disease, stage 4 (severe): Secondary | ICD-10-CM | POA: Diagnosis not present

## 2023-05-06 DIAGNOSIS — I5032 Chronic diastolic (congestive) heart failure: Secondary | ICD-10-CM | POA: Diagnosis not present

## 2023-05-06 DIAGNOSIS — I130 Hypertensive heart and chronic kidney disease with heart failure and stage 1 through stage 4 chronic kidney disease, or unspecified chronic kidney disease: Secondary | ICD-10-CM | POA: Insufficient documentation

## 2023-05-06 DIAGNOSIS — K922 Gastrointestinal hemorrhage, unspecified: Secondary | ICD-10-CM | POA: Insufficient documentation

## 2023-05-06 DIAGNOSIS — D649 Anemia, unspecified: Secondary | ICD-10-CM

## 2023-05-06 LAB — COMPREHENSIVE METABOLIC PANEL WITH GFR
ALT: 14 U/L (ref 0–44)
AST: 25 U/L (ref 15–41)
Albumin: 4.2 g/dL (ref 3.5–5.0)
Alkaline Phosphatase: 95 U/L (ref 38–126)
Anion gap: 9 (ref 5–15)
BUN: 28 mg/dL — ABNORMAL HIGH (ref 8–23)
CO2: 26 mmol/L (ref 22–32)
Calcium: 9 mg/dL (ref 8.9–10.3)
Chloride: 101 mmol/L (ref 98–111)
Creatinine, Ser: 1.68 mg/dL — ABNORMAL HIGH (ref 0.44–1.00)
GFR, Estimated: 31 mL/min — ABNORMAL LOW
Glucose, Bld: 142 mg/dL — ABNORMAL HIGH (ref 70–99)
Potassium: 4 mmol/L (ref 3.5–5.1)
Sodium: 136 mmol/L (ref 135–145)
Total Bilirubin: 0.6 mg/dL (ref 0.3–1.2)
Total Protein: 7.4 g/dL (ref 6.5–8.1)

## 2023-05-06 LAB — IRON AND TIBC
Iron: 59 ug/dL (ref 28–170)
Saturation Ratios: 15 % (ref 10.4–31.8)
TIBC: 393 ug/dL (ref 250–450)
UIBC: 334 ug/dL

## 2023-05-06 LAB — CBC WITH DIFFERENTIAL/PLATELET
Abs Immature Granulocytes: 0.01 K/uL (ref 0.00–0.07)
Basophils Absolute: 0 K/uL (ref 0.0–0.1)
Basophils Relative: 0 %
Eosinophils Absolute: 0.1 K/uL (ref 0.0–0.5)
Eosinophils Relative: 2 %
HCT: 39.9 % (ref 36.0–46.0)
Hemoglobin: 12.1 g/dL (ref 12.0–15.0)
Immature Granulocytes: 0 %
Lymphocytes Relative: 40 %
Lymphs Abs: 2.1 K/uL (ref 0.7–4.0)
MCH: 28.4 pg (ref 26.0–34.0)
MCHC: 30.3 g/dL (ref 30.0–36.0)
MCV: 93.7 fL (ref 80.0–100.0)
Monocytes Absolute: 0.5 K/uL (ref 0.1–1.0)
Monocytes Relative: 9 %
Neutro Abs: 2.6 K/uL (ref 1.7–7.7)
Neutrophils Relative %: 49 %
Platelets: 270 K/uL (ref 150–400)
RBC: 4.26 MIL/uL (ref 3.87–5.11)
RDW: 13.7 % (ref 11.5–15.5)
WBC: 5.3 K/uL (ref 4.0–10.5)
nRBC: 0 % (ref 0.0–0.2)

## 2023-05-06 LAB — FERRITIN: Ferritin: 31 ng/mL (ref 11–307)

## 2023-05-06 NOTE — Progress Notes (Signed)
No injection needed today d/t hemoglobin of 12.1 patient made aware.

## 2023-05-06 NOTE — Patient Instructions (Addendum)
[facility] at [facility] Discharge Instructions  You were seen today by Rojelio Brenner PA-C for your anemia.  Your anemia is most due to your chronic kidney disease and iron deficiency from your history of GI bleeding.  Your iron levels are lower than normal.  We will schedule you for IV iron x 3 doses.  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  We will recheck labs and see you for office visit in 3 months.  Continue to follow-up with Dr. Wolfgang Phoenix for your chronic kidney disease, and Dr. Levon Hedger & [facility] gastro[facility] for your chronic GI bleeding.  ** Thank you for trusting me with your healthcare!  I strive to provide all of my patients with quality care at each visit.  If you receive a survey for this visit, I would be so grateful to you for taking the time to provide feedback.  Thank you in advance!  ~ Rhodie Cienfuegos                   Dr. Doreatha Massed   &   Rojelio Brenner, PA-C   - - - - - - - - - - - - - - - - - -     Thank you for choosing [facility] at [facility] to provide your oncology and hematology care.  To afford each patient quality time with our provider, please arrive at least 15 minutes before your scheduled appointment time.   If you have a lab appointment with the Cancer Center please come in thru the Main Entrance and check in at the main information desk.  You need to re-schedule your appointment should you arrive 10 or more minutes late.  We strive to give you quality time with our providers, and arriving late affects you and other patients whose appointments are after yours.  Also, if you no show three or more times for appointments you may be dismissed from the clinic at the providers discretion.     Again, thank you for choosing [facility].  Our hope is that these requests will decrease the amount of  time that you wait before being seen by our physicians.       _____________________________________________________________  Should you have questions after your visit to [facility], please contact our office at [phone] and follow the prompts.  Our office hours are 8:00 a.m. and 4:30 p.m. Monday - Friday.  Please note that voicemails left after 4:00 p.m. may not be returned until the following business day.  We are closed weekends and major holidays.  You do have access to a nurse 24-7, just call the main number to the clinic [phone] and do not press any options, hold on the line and a nurse will answer the phone.    For prescription refill requests, have your pharmacy contact our office and allow 72 hours.    Due to Covid, you will need to wear a mask upon entering the hospital. If you do not have a mask, a mask will be given to you at the Main Entrance upon arrival. For doctor visits, patients may have 1 support person age 64 or older with them. For treatment visits, patients can not have anyone with them due to social distancing guidelines and our immunocompromised population.

## 2023-05-07 ENCOUNTER — Other Ambulatory Visit: Payer: Self-pay

## 2023-05-07 DIAGNOSIS — D649 Anemia, unspecified: Secondary | ICD-10-CM

## 2023-05-07 DIAGNOSIS — N184 Chronic kidney disease, stage 4 (severe): Secondary | ICD-10-CM

## 2023-05-07 DIAGNOSIS — D500 Iron deficiency anemia secondary to blood loss (chronic): Secondary | ICD-10-CM

## 2023-05-08 ENCOUNTER — Telehealth: Payer: Self-pay

## 2023-05-08 NOTE — Telephone Encounter (Signed)
Pt called with c/o soreness x 3 days of her R toe. She has been offered an appt for next week in [place] but states she does not have transportation and only wants to be seen in [place]. She was given first available in [place], at the end of June and stated she will try to figure something else out in the meantime. She is aware to call us back if anything changes/worsens.

## 2023-05-15 ENCOUNTER — Other Ambulatory Visit: Payer: Self-pay | Admitting: Internal Medicine

## 2023-05-15 DIAGNOSIS — K5521 Angiodysplasia of colon with hemorrhage: Secondary | ICD-10-CM

## 2023-05-16 ENCOUNTER — Inpatient Hospital Stay: Payer: Medicare HMO

## 2023-05-16 ENCOUNTER — Encounter: Payer: Self-pay | Admitting: Gastroenterology

## 2023-05-16 VITALS — BP 171/65 | HR 54 | Temp 97.7°F | Resp 18

## 2023-05-16 DIAGNOSIS — D500 Iron deficiency anemia secondary to blood loss (chronic): Secondary | ICD-10-CM

## 2023-05-16 DIAGNOSIS — D631 Anemia in chronic kidney disease: Secondary | ICD-10-CM

## 2023-05-16 MED ORDER — SODIUM CHLORIDE 0.9 % IV SOLN: Freq: Once | INTRAVENOUS | Status: AC

## 2023-05-16 MED ORDER — SODIUM CHLORIDE 0.9 % IV SOLN
300.0000 mg | Freq: Once | INTRAVENOUS | Status: AC
  Administered 2023-05-16: 300 mg via INTRAVENOUS
  Filled 2023-05-16: qty 200

## 2023-05-16 NOTE — Progress Notes (Signed)
Patient presents today for iron infusion.  Patient is in satisfactory condition with no new complaints voiced.  Vital signs are stable.  IV placed in L arm.  IV flushed well with good blood return noted.  Tylenol and Zyrtec taken at 0945 at home prior to visit.  We will hold pre-medications today.   We will proceed with infusion per provider orders.    Hemoglobin on 05/06/23 was 12.1.  We will hold Retacrit injection today.  Patient tolerated infusion well with no complaints voiced.  Patient left ambulatory with rollaider in stable condition.  Vital signs stable at discharge.  Follow up as scheduled.

## 2023-05-16 NOTE — Patient Instructions (Signed)
[facility] [place]  Discharge Instructions: Thank you for choosing [facility] to provide your oncology and hematology care.  If you have a lab appointment with the Cancer Center - please note that after April 8th, 2024, all labs will be drawn in the cancer center.  You do not have to check in or register with the main entrance as you have in the past but will complete your check-in in the cancer center.  Wear comfortable clothing and clothing appropriate for easy access to any Portacath or PICC line.   We strive to give you quality time with your provider. You may need to reschedule your appointment if you arrive late (15 or more minutes).  Arriving late affects you and other patients whose appointments are after yours.  Also, if you miss three or more appointments without notifying the office, you may be dismissed from the clinic at the provider's discretion.      For prescription refill requests, have your pharmacy contact our office and allow 72 hours for refills to be completed.    Iron Sucrose Injection What is this medication? IRON SUCROSE (EYE ern SOO krose) treats low levels of iron (iron deficiency anemia) in people with kidney disease. Iron is a mineral that plays an important role in making red blood cells, which carry oxygen from your lungs to the rest of your body. This medicine may be used for other purposes; ask your health care provider or pharmacist if you have questions. COMMON BRAND NAME(S): Venofer What should I tell my care team before I take this medication? They need to know if you have any of these conditions: Anemia not caused by low iron levels Heart disease High levels of iron in the blood Kidney disease Liver disease An unusual or allergic reaction to iron, other medications, foods, dyes, or preservatives Pregnant or trying to get pregnant Breastfeeding How should I use this medication? This medication is for infusion into a vein. It  is given in a hospital or clinic setting. Talk to your care team about the use of this medication in children. While this medication may be prescribed for children as young as 2 years for selected conditions, precautions do apply. Overdosage: If you think you have taken too much of this medicine contact a poison control center or emergency room at once. NOTE: This medicine is only for you. Do not share this medicine with others. What if I miss a dose? Keep appointments for follow-up doses. It is important not to miss your dose. Call your care team if you are unable to keep an appointment. What may interact with this medication? Do not take this medication with any of the following: Deferoxamine Dimercaprol Other iron products This medication may also interact with the following: Chloramphenicol Deferasirox This list may not describe all possible interactions. Give your health care provider a list of all the medicines, herbs, non-prescription drugs, or dietary supplements you use. Also tell them if you smoke, drink alcohol, or use illegal drugs. Some items may interact with your medicine. What should I watch for while using this medication? Visit your care team regularly. Tell your care team if your symptoms do not start to get better or if they get worse. You may need blood work done while you are taking this medication. You may need to follow a special diet. Talk to your care team. Foods that contain iron include: whole grains/cereals, dried fruits, beans, or peas, leafy green vegetables, and organ meats (  liver, kidney). What side effects may I notice from receiving this medication? Side effects that you should report to your care team as soon as possible: Allergic reactions--skin rash, itching, hives, swelling of the face, lips, tongue, or throat Low blood pressure--dizziness, feeling faint or lightheaded, blurry vision Shortness of breath Side effects that usually do not require medical  attention (report to your care team if they continue or are bothersome): Flushing Headache Joint pain Muscle pain Nausea Pain, redness, or irritation at injection site This list may not describe all possible side effects. Call your doctor for medical advice about side effects. You may report side effects to FDA at 1-800-FDA-1088. Where should I keep my medication? This medication is given in a hospital or clinic and will not be stored at home. NOTE: This sheet is a summary. It may not cover all possible information. If you have questions about this medicine, talk to your doctor, pharmacist, or health care provider.  2023 Elsevier/Gold Standard (2021-03-23 00:00:00)    To help prevent nausea and vomiting after your treatment, we encourage you to take your nausea medication as directed.  BELOW ARE SYMPTOMS THAT SHOULD BE REPORTED IMMEDIATELY: *FEVER GREATER THAN 100.4 F (38 C) OR HIGHER *CHILLS OR SWEATING *NAUSEA AND VOMITING THAT IS NOT CONTROLLED WITH YOUR NAUSEA MEDICATION *UNUSUAL SHORTNESS OF BREATH *UNUSUAL BRUISING OR BLEEDING *URINARY PROBLEMS (pain or burning when urinating, or frequent urination) *BOWEL PROBLEMS (unusual diarrhea, constipation, pain near the anus) TENDERNESS IN MOUTH AND THROAT WITH OR WITHOUT PRESENCE OF ULCERS (sore throat, sores in mouth, or a toothache) UNUSUAL RASH, SWELLING OR PAIN  UNUSUAL VAGINAL DISCHARGE OR ITCHING   Items with * indicate a potential emergency and should be followed up as soon as possible or go to the Emergency Department if any problems should occur.  Please show the CHEMOTHERAPY ALERT CARD or IMMUNOTHERAPY ALERT CARD at check-in to the Emergency Department and triage nurse.  Should you have questions after your visit or need to cancel or reschedule your appointment, please contact [facility] [place] [phone]  and follow the prompts.  Office hours are 8:00 a.m. to 4:30 p.m. Monday - Friday. Please note that  voicemails left after 4:00 p.m. may not be returned until the following business day.  We are closed weekends and major holidays. You have access to a nurse at all times for urgent questions. Please call the main number to the clinic [phone] and follow the prompts.  For any non-urgent questions, you may also contact your provider using MyChart. We now offer e-Visits for anyone 18 and older to request care online for non-urgent symptoms. For details visit [email or web address].   Also download the MyChart app! Go to the app store, search "MyChart", open the app, select [place], and log in with your MyChart username and password.   

## 2023-05-24 ENCOUNTER — Inpatient Hospital Stay: Payer: Medicare HMO

## 2023-05-24 VITALS — BP 152/65 | HR 55 | Temp 97.5°F | Resp 18

## 2023-05-24 DIAGNOSIS — D500 Iron deficiency anemia secondary to blood loss (chronic): Secondary | ICD-10-CM

## 2023-05-24 DIAGNOSIS — D631 Anemia in chronic kidney disease: Secondary | ICD-10-CM

## 2023-05-24 MED ORDER — SODIUM CHLORIDE 0.9 % IV SOLN
300.0000 mg | Freq: Once | INTRAVENOUS | Status: AC
  Administered 2023-05-24: 300 mg via INTRAVENOUS
  Filled 2023-05-24: qty 300

## 2023-05-24 MED ORDER — SODIUM CHLORIDE 0.9 % IV SOLN: Freq: Once | INTRAVENOUS | Status: AC

## 2023-05-24 NOTE — Progress Notes (Signed)
Patient presents today for iron infusion.  Patient is in satisfactory condition with no new complaints voiced.  Vital signs are stable.  IV placed in right AC.  IV flushed well with good blood return noted.   Tylenol and Benadryl were taken at 0745 at home prior to visit.  We will hold pre-medications today.  We will proceed with infusion per provider orders.    Patient tolerated infusion well with no complaints voiced.  Patient left ambulatory with rolling walker in stable condition.  Vital signs stable at discharge.  Follow up as scheduled.

## 2023-05-24 NOTE — Patient Instructions (Signed)
[facility]  Discharge Instructions: Thank you for choosing [facility] to provide your oncology and hematology care.  If you have a lab appointment with the Cancer Center - please note that after April 8th, 2024, all labs will be drawn in the cancer center.  You do not have to check in or register with the main entrance as you have in the past but will complete your check-in in the cancer center.  Wear comfortable clothing and clothing appropriate for easy access to any Portacath or PICC line.   We strive to give you quality time with your provider. You may need to reschedule your appointment if you arrive late (15 or more minutes).  Arriving late affects you and other patients whose appointments are after yours.  Also, if you miss three or more appointments without notifying the office, you may be dismissed from the clinic at the provider's discretion.      For prescription refill requests, have your pharmacy contact our office and allow 72 hours for refills to be completed.    Iron Sucrose Injection What is this medication? IRON SUCROSE (EYE ern SOO krose) treats low levels of iron (iron deficiency anemia) in people with kidney disease. Iron is a mineral that plays an important role in making red blood cells, which carry oxygen from your lungs to the rest of your body. This medicine may be used for other purposes; ask your health care provider or pharmacist if you have questions. COMMON BRAND NAME(S): Venofer What should I tell my care team before I take this medication? They need to know if you have any of these conditions: Anemia not caused by low iron levels Heart disease High levels of iron in the blood Kidney disease Liver disease An unusual or allergic reaction to iron, other medications, foods, dyes, or preservatives Pregnant or trying to get pregnant Breastfeeding How should I use this medication? This medication is for infusion into a vein. It  is given in a hospital or clinic setting. Talk to your care team about the use of this medication in children. While this medication may be prescribed for children as young as 2 years for selected conditions, precautions do apply. Overdosage: If you think you have taken too much of this medicine contact a poison control center or emergency room at once. NOTE: This medicine is only for you. Do not share this medicine with others. What if I miss a dose? Keep appointments for follow-up doses. It is important not to miss your dose. Call your care team if you are unable to keep an appointment. What may interact with this medication? Do not take this medication with any of the following: Deferoxamine Dimercaprol Other iron products This medication may also interact with the following: Chloramphenicol Deferasirox This list may not describe all possible interactions. Give your health care provider a list of all the medicines, herbs, non-prescription drugs, or dietary supplements you use. Also tell them if you smoke, drink alcohol, or use illegal drugs. Some items may interact with your medicine. What should I watch for while using this medication? Visit your care team regularly. Tell your care team if your symptoms do not start to get better or if they get worse. You may need blood work done while you are taking this medication. You may need to follow a special diet. Talk to your care team. Foods that contain iron include: whole grains/cereals, dried fruits, beans, or peas, leafy green vegetables, and organ meats (  liver, kidney). What side effects may I notice from receiving this medication? Side effects that you should report to your care team as soon as possible: Allergic reactions--skin rash, itching, hives, swelling of the face, lips, tongue, or throat Low blood pressure--dizziness, feeling faint or lightheaded, blurry vision Shortness of breath Side effects that usually do not require medical  attention (report to your care team if they continue or are bothersome): Flushing Headache Joint pain Muscle pain Nausea Pain, redness, or irritation at injection site This list may not describe all possible side effects. Call your doctor for medical advice about side effects. You may report side effects to FDA at 1-800-FDA-1088. Where should I keep my medication? This medication is given in a hospital or clinic and will not be stored at home. NOTE: This sheet is a summary. It may not cover all possible information. If you have questions about this medicine, talk to your doctor, pharmacist, or health care provider.  2024 Elsevier/Gold Standard (2022-06-20 00:00:00)    To help prevent nausea and vomiting after your treatment, we encourage you to take your nausea medication as directed.  BELOW ARE SYMPTOMS THAT SHOULD BE REPORTED IMMEDIATELY: *FEVER GREATER THAN 100.4 F (38 C) OR HIGHER *CHILLS OR SWEATING *NAUSEA AND VOMITING THAT IS NOT CONTROLLED WITH YOUR NAUSEA MEDICATION *UNUSUAL SHORTNESS OF BREATH *UNUSUAL BRUISING OR BLEEDING *URINARY PROBLEMS (pain or burning when urinating, or frequent urination) *BOWEL PROBLEMS (unusual diarrhea, constipation, pain near the anus) TENDERNESS IN MOUTH AND THROAT WITH OR WITHOUT PRESENCE OF ULCERS (sore throat, sores in mouth, or a toothache) UNUSUAL RASH, SWELLING OR PAIN  UNUSUAL VAGINAL DISCHARGE OR ITCHING   Items with * indicate a potential emergency and should be followed up as soon as possible or go to the Emergency Department if any problems should occur.  Please show the CHEMOTHERAPY ALERT CARD or IMMUNOTHERAPY ALERT CARD at check-in to the Emergency Department and triage nurse.  Should you have questions after your visit or need to cancel or reschedule your appointment, please contact [facility] [phone]  and follow the prompts.  Office hours are 8:00 a.m. to 4:30 p.m. Monday - Friday. Please note that  voicemails left after 4:00 p.m. may not be returned until the following business day.  We are closed weekends and major holidays. You have access to a nurse at all times for urgent questions. Please call the main number to the clinic [phone] and follow the prompts.  For any non-urgent questions, you may also contact your provider using MyChart. We now offer e-Visits for anyone 46 and older to request care online for non-urgent symptoms. For details visit [email or web address].   Also download the MyChart app! Go to the app store, search "MyChart", open the app, select [place], and log in with your MyChart username and password.

## 2023-05-30 MED FILL — Iron Sucrose Inj 20 MG/ML (Fe Equiv): INTRAVENOUS | Qty: 15 | Status: AC

## 2023-05-31 ENCOUNTER — Inpatient Hospital Stay: Payer: Medicare HMO | Attending: Hematology

## 2023-05-31 VITALS — BP 140/59 | HR 48 | Temp 96.2°F | Resp 18

## 2023-05-31 DIAGNOSIS — D631 Anemia in chronic kidney disease: Secondary | ICD-10-CM | POA: Diagnosis not present

## 2023-05-31 DIAGNOSIS — I129 Hypertensive chronic kidney disease with stage 1 through stage 4 chronic kidney disease, or unspecified chronic kidney disease: Secondary | ICD-10-CM | POA: Insufficient documentation

## 2023-05-31 DIAGNOSIS — Z79899 Other long term (current) drug therapy: Secondary | ICD-10-CM | POA: Diagnosis not present

## 2023-05-31 DIAGNOSIS — N184 Chronic kidney disease, stage 4 (severe): Secondary | ICD-10-CM | POA: Insufficient documentation

## 2023-05-31 DIAGNOSIS — D500 Iron deficiency anemia secondary to blood loss (chronic): Secondary | ICD-10-CM

## 2023-05-31 MED ORDER — SODIUM CHLORIDE 0.9 % IV SOLN: Freq: Once | INTRAVENOUS | Status: AC

## 2023-05-31 MED ORDER — SODIUM CHLORIDE 0.9 % IV SOLN
300.0000 mg | Freq: Once | INTRAVENOUS | Status: AC
  Administered 2023-05-31: 300 mg via INTRAVENOUS
  Filled 2023-05-31: qty 300

## 2023-05-31 NOTE — Progress Notes (Signed)
Patient took premeds from home.   Patient tolerated iron infusion with no complaints voiced.  Peripheral IV site clean and dry with good blood return noted before and after infusion.  Band aid applied.  VSS with discharge and left in satisfactory condition with no s/s of distress noted.

## 2023-05-31 NOTE — Patient Instructions (Signed)
[facility] [place]  Discharge Instructions: Thank you for choosing [facility] to provide your oncology and hematology care.  If you have a lab appointment with the Cancer Center - please note that after April 8th, 2024, all labs will be drawn in the cancer center.  You do not have to check in or register with the main entrance as you have in the past but will complete your check-in in the cancer center.  Wear comfortable clothing and clothing appropriate for easy access to any Portacath or PICC line.   We strive to give you quality time with your provider. You may need to reschedule your appointment if you arrive late (15 or more minutes).  Arriving late affects you and other patients whose appointments are after yours.  Also, if you miss three or more appointments without notifying the office, you may be dismissed from the clinic at the provider's discretion.      For prescription refill requests, have your pharmacy contact our office and allow 72 hours for refills to be completed.  To help prevent nausea and vomiting after your treatment, we encourage you to take your nausea medication as directed.  BELOW ARE SYMPTOMS THAT SHOULD BE REPORTED IMMEDIATELY: *FEVER GREATER THAN 100.4 F (38 C) OR HIGHER *CHILLS OR SWEATING *NAUSEA AND VOMITING THAT IS NOT CONTROLLED WITH YOUR NAUSEA MEDICATION *UNUSUAL SHORTNESS OF BREATH *UNUSUAL BRUISING OR BLEEDING *URINARY PROBLEMS (pain or burning when urinating, or frequent urination) *BOWEL PROBLEMS (unusual diarrhea, constipation, pain near the anus) TENDERNESS IN MOUTH AND THROAT WITH OR WITHOUT PRESENCE OF ULCERS (sore throat, sores in mouth, or a toothache) UNUSUAL RASH, SWELLING OR PAIN  UNUSUAL VAGINAL DISCHARGE OR ITCHING   Items with * indicate a potential emergency and should be followed up as soon as possible or go to the Emergency Department if any problems should occur.  Please show the CHEMOTHERAPY ALERT CARD or  IMMUNOTHERAPY ALERT CARD at check-in to the Emergency Department and triage nurse.  Should you have questions after your visit or need to cancel or reschedule your appointment, please contact [facility] [place] [phone]  and follow the prompts.  Office hours are 8:00 a.m. to 4:30 p.m. Monday - Friday. Please note that voicemails left after 4:00 p.m. may not be returned until the following business day.  We are closed weekends and major holidays. You have access to a nurse at all times for urgent questions. Please call the main number to the clinic [phone] and follow the prompts.  For any non-urgent questions, you may also contact your provider using MyChart. We now offer e-Visits for anyone 18 and older to request care online for non-urgent symptoms. For details visit [email or web address].   Also download the MyChart app! Go to the app store, search "MyChart", open the app, select [place], and log in with your MyChart username and password.   

## 2023-06-04 ENCOUNTER — Inpatient Hospital Stay: Payer: Medicare HMO

## 2023-06-04 ENCOUNTER — Inpatient Hospital Stay: Payer: Medicare HMO | Admitting: Physician Assistant

## 2023-06-17 ENCOUNTER — Inpatient Hospital Stay: Payer: Medicare HMO

## 2023-06-17 ENCOUNTER — Inpatient Hospital Stay (HOSPITAL_BASED_OUTPATIENT_CLINIC_OR_DEPARTMENT_OTHER): Payer: Medicare HMO | Admitting: Oncology

## 2023-06-17 VITALS — BP 138/56 | HR 60 | Temp 96.8°F | Resp 20

## 2023-06-17 DIAGNOSIS — D500 Iron deficiency anemia secondary to blood loss (chronic): Secondary | ICD-10-CM

## 2023-06-17 DIAGNOSIS — N184 Chronic kidney disease, stage 4 (severe): Secondary | ICD-10-CM

## 2023-06-17 DIAGNOSIS — M109 Gout, unspecified: Secondary | ICD-10-CM

## 2023-06-17 DIAGNOSIS — D649 Anemia, unspecified: Secondary | ICD-10-CM

## 2023-06-17 DIAGNOSIS — D631 Anemia in chronic kidney disease: Secondary | ICD-10-CM

## 2023-06-17 DIAGNOSIS — I129 Hypertensive chronic kidney disease with stage 1 through stage 4 chronic kidney disease, or unspecified chronic kidney disease: Secondary | ICD-10-CM | POA: Diagnosis not present

## 2023-06-17 LAB — CBC
HCT: 33.6 % — ABNORMAL LOW (ref 36.0–46.0)
Hemoglobin: 10.3 g/dL — ABNORMAL LOW (ref 12.0–15.0)
MCH: 28.5 pg (ref 26.0–34.0)
MCHC: 30.7 g/dL (ref 30.0–36.0)
MCV: 92.8 fL (ref 80.0–100.0)
Platelets: 227 10*3/uL (ref 150–400)
RBC: 3.62 MIL/uL — ABNORMAL LOW (ref 3.87–5.11)
RDW: 15.1 % (ref 11.5–15.5)
WBC: 4.7 10*3/uL (ref 4.0–10.5)
nRBC: 0 % (ref 0.0–0.2)

## 2023-06-17 LAB — URIC ACID: Uric Acid, Serum: 8.9 mg/dL — ABNORMAL HIGH (ref 2.5–7.1)

## 2023-06-17 MED ORDER — EPOETIN ALFA 10000 UNIT/ML IJ SOLN
10000.0000 [IU] | Freq: Once | INTRAMUSCULAR | Status: AC
  Administered 2023-06-17: 10000 [IU] via SUBCUTANEOUS
  Filled 2023-06-17: qty 1

## 2023-06-17 NOTE — Patient Instructions (Signed)
[facility]  Discharge Instructions: Thank you for choosing [facility] to provide your oncology and hematology care.  If you have a lab appointment with the Cancer Center - please note that after April 8th, 2024, all labs will be drawn in the cancer center.  You do not have to check in or register with the main entrance as you have in the past but will complete your check-in in the cancer center.  Wear comfortable clothing and clothing appropriate for easy access to any Portacath or PICC line.   We strive to give you quality time with your provider. You may need to reschedule your appointment if you arrive late (15 or more minutes).  Arriving late affects you and other patients whose appointments are after yours.  Also, if you miss three or more appointments without notifying the office, you may be dismissed from the clinic at the provider's discretion.      For prescription refill requests, have your pharmacy contact our office and allow 72 hours for refills to be completed.    Today you received the following Epoetin, return as scheduled.   To help prevent nausea and vomiting after your treatment, we encourage you to take your nausea medication as directed.  BELOW ARE SYMPTOMS THAT SHOULD BE REPORTED IMMEDIATELY: *FEVER GREATER THAN 100.4 F (38 C) OR HIGHER *CHILLS OR SWEATING *NAUSEA AND VOMITING THAT IS NOT CONTROLLED WITH YOUR NAUSEA MEDICATION *UNUSUAL SHORTNESS OF BREATH *UNUSUAL BRUISING OR BLEEDING *URINARY PROBLEMS (pain or burning when urinating, or frequent urination) *BOWEL PROBLEMS (unusual diarrhea, constipation, pain near the anus) TENDERNESS IN MOUTH AND THROAT WITH OR WITHOUT PRESENCE OF ULCERS (sore throat, sores in mouth, or a toothache) UNUSUAL RASH, SWELLING OR PAIN  UNUSUAL VAGINAL DISCHARGE OR ITCHING   Items with * indicate a potential emergency and should be followed up as soon as possible or go to the Emergency Department if  any problems should occur.  Please show the CHEMOTHERAPY ALERT CARD or IMMUNOTHERAPY ALERT CARD at check-in to the Emergency Department and triage nurse.  Should you have questions after your visit or need to cancel or reschedule your appointment, please contact [facility] [phone]  and follow the prompts.  Office hours are 8:00 a.m. to 4:30 p.m. Monday - Friday. Please note that voicemails left after 4:00 p.m. may not be returned until the following business day.  We are closed weekends and major holidays. You have access to a nurse at all times for urgent questions. Please call the main number to the clinic [phone] and follow the prompts.  For any non-urgent questions, you may also contact your provider using MyChart. We now offer e-Visits for anyone 101 and older to request care online for non-urgent symptoms. For details visit [email or web address].   Also download the MyChart app! Go to the app store, search "MyChart", open the app, select [place], and log in with your MyChart username and password.

## 2023-06-17 NOTE — Progress Notes (Signed)
Patient presents today for Retacrit injection. Hemoglobin reviewed prior to administration. VSS tolerated without incident or complaint. See MAR for details. Patient stable during and after injection. Patient discharged in satisfactory condition with no s/s of distress noted.  

## 2023-06-18 ENCOUNTER — Ambulatory Visit (INDEPENDENT_AMBULATORY_CARE_PROVIDER_SITE_OTHER): Payer: Medicare HMO | Admitting: Gastroenterology

## 2023-06-18 ENCOUNTER — Encounter: Payer: Self-pay | Admitting: Gastroenterology

## 2023-06-18 VITALS — BP 139/53 | HR 65 | Temp 97.3°F | Ht 62.0 in | Wt 178.3 lb

## 2023-06-18 DIAGNOSIS — D509 Iron deficiency anemia, unspecified: Secondary | ICD-10-CM | POA: Diagnosis not present

## 2023-06-18 DIAGNOSIS — K581 Irritable bowel syndrome with constipation: Secondary | ICD-10-CM

## 2023-06-18 DIAGNOSIS — K5521 Angiodysplasia of colon with hemorrhage: Secondary | ICD-10-CM | POA: Diagnosis not present

## 2023-06-18 DIAGNOSIS — K219 Gastro-esophageal reflux disease without esophagitis: Secondary | ICD-10-CM

## 2023-06-18 MED ORDER — PANTOPRAZOLE SODIUM 40 MG PO TBEC: 40.0000 mg | DELAYED_RELEASE_TABLET | Freq: Every day | ORAL | 3 refills | Status: DC

## 2023-06-18 NOTE — Patient Instructions (Addendum)
It was wonderful to see you again today!  Continue your octreotide injection twice daily.  Continue Amitiza twice daily  Continue pantoprazole 40 mg once daily.  I sent refill to the pharmacy for you today.  Continue to avoid all NSAIDs and please let me know if you begin to have any black/tarry stools or any bright red blood in your stool.  Since you are following regularly with hematology we will plan to follow-up here in 1 year or if you begin to have any signs of GI bleeding we can certainly see you sooner.  I want to create trusting relationships with patients. If you receive a survey regarding your visit,  I greatly appreciate you taking time to fill this out on paper or through your MyChart. I value your feedback.  Brooke Bonito, MSN, FNP-BC, AGACNP-BC [facility]

## 2023-06-18 NOTE — Progress Notes (Signed)
GI Office Note    Referring Provider: Doreatha Massed, MD Primary Care Physician:  Doreatha Massed, MD Primary Gastroenterologist: Hennie Duos. 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 Patient denies any current gi issues.    History of Present Illness  [name] [name] is a 77 y.o. 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  EGD in December 2021 which was relatively unremarkable and has subsequent small bowel capsule endoscopy revealing multiple small bowel AVMs and underwent enteroscopy with APC of multiple AVMs on 11/27/20.   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    Hospitalization 06/21/22-06/23/22.  She presented with weakness and worsening dyspnea on exertion and was found to have a hemoglobin of 7.8.  Iron panel with iron 21, TIBC 441, saturation 5%, and ferritin 12.  She was given 1 unit of PRBC.  She reportedly had been following with hematology and was going to discuss IV iron infusions.  She regularly has her hemoglobin checked every 2 weeks.  She was seen in consult for her anemia and history of small bowel AVMs by our team.  She mentioned intermittent blood in her stools due to hemorrhoids and last noted melena about a month prior to presentation.  Given recurrent anemia and possible bleeding the decision was made to refer her back to [facility] for double-balloon enteroscopy  evaluation/intervention.  Patient agreed to this and was treated conservatively with blood transfusion/iron infusion.  She was advised to continue octreotide injections.   Seen by hematology on 07/02/2022.  She reportedly was still symptomatic with fatigue, ice pica, lightheadedness, palpitations, and some dyspnea on exertion.  She failed to improve with oral iron supplementation therefore it was discontinued and the plan was for IV Venofer 400 mg for 2 doses and Retacrit 10,000 units every 2 weeks starting on 07/12/2022.  Other lab work was ordered including copper, folate, homocystine, B12, reticulocytes, methylmalonic acid, LDH, immunofixation, free light chains, and SPEP.  She was to repeat CBC and iron panel and follow-up in 2 months.   Labs 07/02/2022: Hemoglobin 9.7, MCV 92.2, folate 18.9, LDH 140, reticulocyte percentage 3.7, absolute retake count 132.8, immature retake fractionation 29.9%, copper normal at 107, methylmalonic acid normal at 255, B12 505.   Last seen in the office 07/30/22. No reports of melena or hematochezia. Energy had improved, recently received iron infusions. Denied N/V, reflux, dysphagia, worsening SOB. Having hgb checked every 2 weeks, Due to see [facility] in 1 month. Using anusol intermittently for perineal discomfort. Constipation well controlled with Amitiza twice daily. Advised to use zinc based cream to perineum. Continue octreotide and regular follow up with hematology.    Small bowel enteroscopy performed by [facility] 10/08/2022: -3 medium angioectasias, along with bleeding found in the gastric body s/p APC -40 angiectasias, several bleeding found in the entire duodenum s/p APC -21 angiectasias, several bleeding in proximal jejunum s/p  APC -Recommended to continue monitoring H/H and iron stores, continue octreotide.  Last office visit 12/03/22.  Amitiza working well to control constipation.  Having soft stools and no need to straining.  Now she is able to eat regular foods  that she likes without issue.  She is continuing to follow with hematology and having labs once per month.  Energy is improved.  Denies any melena or BRBPR, abdominal pain, pica.  No weight loss and appetite is much improved.  Working on gaining weight.  She was advised to continue octreotide 100 mcg subcutaneous twice daily and her Amitiza twice daily as well as PPI once daily.  Continue to follow with hematology.     Latest Ref Rng & Units 06/17/2023   10:22 AM 05/06/2023    8:48 AM 03/25/2023    9:27 AM  CBC  WBC 4.0 - 10.5 K/uL 4.7  5.3  5.2   Hemoglobin 12.0 - 15.0 g/dL 16.1  09.6  04.5   Hematocrit 36.0 - 46.0 % 33.6  39.9  35.2   Platelets 150 - 400 K/uL 227  270  266    Today: She has been receiving Retacrit injections every 6 weeks with hematology and also recently had Venofer which she completed on 05/31/2023.  She follows with them every 3 months and has regular lab work.  Got a retacrit injection yesterday. Doing the octreotide BID - and she gets it sent directly to her.   Appetite varies, with the heat she eats less but overall doing okay.  No constipation or diarrhea. On Amitiza 24 mcg BID -- no nausea associated.  Denies any abdominal pain, weight loss, nausea, vomiting, dysphagia, early satiety, melena, BRBPR, and PICA. Still have some shortness of breath at times and does get worse the lower her hgb gets.   Acidf reflux well controlled.   Wt Readings from Last 3 Encounters:  06/18/23 178 lb 4.8 oz (80.9 kg)  05/06/23 176 lb 6.4 oz (80 kg)  02/21/23 177 lb (80.3 kg)    Current Outpatient Medications  Medication Sig Dispense Refill   acetaminophen (TYLENOL) 500 MG tablet Take 1,000 mg by mouth every 6 (six) hours as needed for moderate pain.     albuterol (VENTOLIN HFA) 108 (90 Base) MCG/ACT inhaler Inhale 2 puffs into the lungs every 6 (six) hours as needed for wheezing or shortness of breath. 18 g 1   apixaban (ELIQUIS) 5 MG TABS tablet Take 1 tablet by mouth 2 (two)  times daily.     calcitRIOL (ROCALTROL) 0.25 MCG capsule Take 0.25 mcg by mouth 3 (three) times a week. Monday,Wednesday and Fridays     Cholecalciferol (VITAMIN D-3) 25 MCG (1000 UT) CAPS Take 1,000 Units by mouth daily.     Colchicine 0.6 MG CAPS Take by mouth as needed.     cyclobenzaprine (FLEXERIL) 5 MG tablet Take 5 mg by mouth 3 (three) times daily.     diclofenac Sodium (VOLTAREN) 1 % GEL Apply 1 application topically 4 (four) times daily as needed (pain).     flecainide (TAMBOCOR) 50 MG tablet TAKE 1 TABLET BY MOUTH TWICE A DAY 180 tablet 2   furosemide (LASIX) 20 MG tablet Take 20 mg by mouth daily. In the afternoon In addition to her 40 mg once per day.     furosemide (LASIX) 40 MG tablet TAKE 1 TABLET BY MOUTH EVERY DAY (Patient taking differently: Take 40 mg by mouth daily. In addition to 20 mg in the afternoon.) 90  tablet 2   gabapentin (NEURONTIN) 100 MG capsule Take 1 capsule (100 mg total) by mouth 3 (three) times daily. 90 capsule 2   hydrocortisone (ANUSOL-HC) 2.5 % rectal cream PLACE 1 APPLICATION RECTALLY 2 (TWO) TIMES DAILY. 30 g 1   losartan (COZAAR) 50 MG tablet TAKE 1 TABLET BY MOUTH EVERY DAY 90 tablet 3   lubiprostone (AMITIZA) 24 MCG capsule TAKE 1 CAPSULE BY MOUTH EVERY MORNING AND AT BEDTIME 180 capsule 3   octreotide (SANDOSTATIN) 100 MCG/ML SOLN injection INJECT 1 ML (100 MCG TOTAL) UNDER THE SKIN EVERY 12 (TWELVE) HOURS. 60 mL 11   ondansetron (ZOFRAN) 4 MG tablet Take 1 tablet (4 mg total) by mouth every 6 (six) hours as needed for nausea. 20 tablet 0   polyethylene glycol (MIRALAX / GLYCOLAX) 17 g packet Take 17 g by mouth daily as needed for mild constipation. 14 each 0   potassium chloride SA (KLOR-CON) 20 MEQ tablet Take 20 mEq by mouth in the morning, at noon, and at bedtime.     predniSONE (DELTASONE) 10 MG tablet Take 1 tablet (10 mg total) by mouth 3 (three) times daily. 42 tablet 0   rosuvastatin (CRESTOR) 10 MG tablet TAKE 1 TABLET BY MOUTH EVERY DAY 30  tablet 0   tiZANidine (ZANAFLEX) 4 MG tablet Take 1 tablet (4 mg total) by mouth every 6 (six) hours as needed for muscle spasms. 30 tablet 0   vitamin C (ASCORBIC ACID) 250 MG tablet Take 250 mg by mouth daily.     pantoprazole (PROTONIX) 40 MG tablet Take 1 tablet (40 mg total) by mouth daily. 90 tablet 3   No current facility-administered medications for this visit.    Past Medical History:  Diagnosis Date   Anemia    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HCC) 09/11/2022   Chronic kidney disease    Constipation 07/17/2017   GERD (gastroesophageal reflux disease)    Gout    Grade II diastolic dysfunction 11/23/2020   Hyperlipidemia    Hypertension    Hypokalemia    OSA (obstructive sleep apnea) 08/09/2020   PAF (paroxysmal atrial fibrillation) (HCC)    PAF (paroxysmal atrial fibrillation) (HCC)    PUD (peptic ulcer disease)    Syncope    Neurocardiogenic    Past Surgical History:  Procedure Laterality Date   ABDOMINAL AORTOGRAM W/LOWER EXTREMITY Right 12/13/2020   Procedure: ABDOMINAL AORTOGRAM W/LOWER EXTREMITY;  Surgeon: Nada Libman, MD;  Location: [facility];  Service: Cardiovascular;  Laterality: Right;   ABDOMINAL HYSTERECTOMY     AMPUTATION Right 12/14/2020   Procedure: Right fifth toe amputation;  Surgeon: Larina Earthly, MD;  Location: [facility] OR;  Service: Vascular;  Laterality: Right;   CATARACT EXTRACTION Bilateral    COLONOSCOPY N/A 10/07/2014   Dr. Darrick Penna: redundant left colon, moderate sized external hemorrhoids    COLONOSCOPY N/A 03/11/2020   Surgeon: West Bali, MD;  9 polyps, majority of which were tubular adenomas, nodular mucosa in the anus s/p biopsy which was benign.   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    HOT HEMOSTASIS  11/27/2020   Procedure: HOT HEMOSTASIS (ARGON PLASMA COAGULATION/BICAP);  Surgeon: Marguerita Merles, Reuel Boom, MD;  Location: [facility];  Service: Gastroenterology;;   LIPOMA RESECTION     PERIPHERAL VASCULAR INTERVENTION Right 12/13/2020   Procedure: PERIPHERAL VASCULAR INTERVENTION;  Surgeon: Nada Libman, MD;  Location: [facility]VAS[facility] CV LAB;  Service: Cardiovascular;  Laterality: Right;  SFA/PT/AT   POLYPECTOMY  03/11/2020   Procedure: POLYPECTOMY;  Surgeon: West Bali, MD;  Location: [facility];  Service: Endoscopy;;  cecal, transverse, descending, sigmoid   TEE WITHOUT CARDIOVERSION N/A 05/10/2021   Procedure: TRANSESOPHAGEAL ECHOCARDIOGRAM (TEE) WATCHMEN EVALUATION;  Surgeon: Wendall Stade, MD;  Location: [facility] ENDOSCOPY;  Service: Cardiovascular;  Laterality: N/A;    Family History  Problem Relation Age of Onset   Stroke Mother    Dementia Father    Cancer - Other Sister    Atrial fibrillation Sister    Heart failure Sister    Colon cancer Maternal Grandmother        diagnosed in her 38s    Allergies as of 06/18/2023 - Review Complete 06/18/2023  Allergen Reaction Noted   Acetaminophen-codeine Hives, Itching, and Rash 09/16/2020   Amlodipine  08/03/2020   Amoxicillin Hives 03/04/2020   Doxycycline  11/23/2020   Allopurinol Hives and Rash    Tramadol Hives and Rash     Social History   Socioeconomic History   Marital status: Single    Spouse name: Not on file   Number of children: Not on file   Years of education: Not on file   Highest education level: Not on file  Occupational History   Occupation: Retired  Tobacco Use   Smoking status: Former    Packs/day: 1.00    Years: 20.00    Additional pack years: 0.00    Total pack  years: 20.00    Types: Cigarettes    Start date: 03/16/1964    Quit date: 10/07/1997    Years since quitting: 25.7   Smokeless tobacco: Never  Vaping Use   Vaping Use: Never used  Substance and Sexual Activity   Alcohol use: No    Alcohol/week: 0.0 standard drinks of alcohol   Drug use: No   Sexual activity: Yes    Partners: Male  Other Topics Concern   Not on file  Social History Narrative   Not on file   Social Determinants of Health   Financial Resource Strain: Not on file  Food Insecurity: Not on file  Transportation Needs: Not on file  Physical Activity: Not on file  Stress: Not on file  Social Connections: Not on file     Review of Systems   Gen: Denies fever, chills, anorexia. Denies fatigue, weakness, weight loss.  CV: Denies chest pain, palpitations, syncope, peripheral edema, and claudication. Resp: Denies dyspnea at rest, cough, wheezing, coughing up blood, and pleurisy. GI: See HPI Derm: Denies rash, itching, dry skin Psych: Denies depression, anxiety, memory loss, confusion. No homicidal or suicidal ideation.  Heme: Denies bruising, bleeding, and enlarged lymph nodes.  Physical Exam   BP (!) 139/53 (BP Location: Left Arm, Patient Position: Sitting, Cuff Size: Large)   Pulse 65   Temp (!) 97.3 F (36.3 C) (Temporal)   Ht 5\' 2"  (1.575 m)   Wt 178 lb 4.8 oz (80.9 kg)   BMI 32.61 kg/m   General:   Alert and oriented. No distress noted. Pleasant and cooperative.  Head:  Normocephalic  and atraumatic. Eyes:  Conjuctiva clear without scleral icterus. Mouth:  Oral mucosa pink and moist. Good dentition. No lesions. Lungs:  Clear to auscultation bilaterally. No wheezes, rales, or rhonchi. No distress.  Heart:  S1, S2 present without murmurs appreciated.  Abdomen:  +BS, soft, non-tender and non-distended. No rebound or guarding. No HSM or masses noted. Rectal: deferred Msk:  Symmetrical without gross deformities. Normal posture. Extremities:  Without  edema. Neurologic:  Alert and  oriented x4 Psych:  Alert and cooperative. Normal mood and affect.   Assessment  [name] [name] is a 77 y.o. female with a history of multiple upper GI bleeds/anemia secondary to small bowel AVMs managed on octreotide, A-fib on Eliquis, HTN, OSA, IDA, CKD who presents today for follow-up.  Chronic anemia, history of small bowel AVMs: Multifactorial in setting of small bowel of AVMs and CKD while being on chronic anticoagulation.  She continues to follow closely with hematology for regular lab checks as well as IV iron infusions.  She also has been receiving Retacrit injections every 6 weeks as needed.  See HPI above for recent endoscopic procedures.  She impressively had 64 total AVMs treated with APC therapy at [facility] in October 2023.  She continues to be without any melena or BRBPR.  She has continued on octreotide 100 mcg subcutaneous twice daily and doing well.  She will continue to monitor for any overt GI bleeding and will contact the office if needed prior to 1 year given she is following closely with hematology and has not had any recent GI bleeding.   IBS-constipation: Well-controlled with Amitiza 24 mcg twice daily.  GERD: Well-controlled with pantoprazole 40 mg once daily.  Denies any nausea, vomiting, or dysphagia.  Refill sent in today.  PLAN   Continue octreotide 100 mcg subcu twice daily Continue Amitiza 24 mcg twice daily Continue pantoprazole 40 mg daily.  Refill today. Continue to follow with hematology Avoid NSAIDs Follow up in 1 year.    Brooke Bonito, MSN, FNP-BC, AGACNP-BC [facility]

## 2023-06-19 ENCOUNTER — Ambulatory Visit: Payer: Medicare HMO | Admitting: Vascular Surgery

## 2023-06-19 ENCOUNTER — Ambulatory Visit (INDEPENDENT_AMBULATORY_CARE_PROVIDER_SITE_OTHER): Payer: Medicare HMO

## 2023-06-19 ENCOUNTER — Encounter: Payer: Self-pay | Admitting: Vascular Surgery

## 2023-06-19 ENCOUNTER — Ambulatory Visit: Payer: Medicare HMO

## 2023-06-19 ENCOUNTER — Other Ambulatory Visit: Payer: Self-pay

## 2023-06-19 VITALS — BP 139/76 | HR 64 | Temp 97.3°F | Ht 62.0 in | Wt 178.8 lb

## 2023-06-19 DIAGNOSIS — I739 Peripheral vascular disease, unspecified: Secondary | ICD-10-CM

## 2023-06-19 LAB — VAS US ABI WITH/WO TBI
Left ABI: 0.52
Right ABI: 0.53

## 2023-06-19 NOTE — Progress Notes (Signed)
[facility] [place]  Patient name: [name] [name] MRN: [id number] DOB: 1946-12-14 Sex: female  REASON FOR VISIT: Evaluation pain in her right great toe  HPI: [name] [name] is a 77 y.o. female here today for evaluation.  She is well-known to our practice from prior intervention on the right leg.  She had presented in 2021 with gangrene in her right fifth toe.  She underwent PTA of her right superficial femoral artery with Dr. Myra Gianotti and subsequent right great toe amputation by myself.  She healed this without difficulty.  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  She noted approximately 1 month ago that she had soreness in the right great toe metatarsal head.  She reports that this subsequently has nearly completely resolved.  She does have a history of gout.  She was concerned that this may be indication of arterial insufficiency and is seen today for further evaluation.  She denies any claudication symptoms.  Past Medical History:  Diagnosis Date   Anemia    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HCC) 09/11/2022   Chronic kidney disease    Constipation 07/17/2017   GERD (gastroesophageal reflux disease)    Gout    Grade II diastolic dysfunction 11/23/2020   Hyperlipidemia    Hypertension    Hypokalemia    OSA (obstructive sleep apnea) 08/09/2020   PAF (paroxysmal atrial fibrillation) (HCC)    PAF (paroxysmal atrial fibrillation) (HCC)    PUD (peptic ulcer disease)    Syncope    Neurocardiogenic    Family History  Problem Relation Age of Onset   Stroke Mother    Dementia Father    Cancer - Other Sister    Atrial fibrillation Sister    Heart failure Sister    Colon cancer Maternal Grandmother        diagnosed in her 36s    SOCIAL HISTORY: Social History   Tobacco Use   Smoking status: Former    Packs/day: 1.00    Years: 20.00    Additional pack years: 0.00    Total  pack years: 20.00    Types: Cigarettes    Start date: 03/16/1964    Quit date: 10/07/1997    Years since quitting: 25.7   Smokeless tobacco: Never  Substance Use Topics   Alcohol use: No    Alcohol/week: 0.0 standard drinks of alcohol    Allergies  Allergen Reactions   Acetaminophen-Codeine Hives, Itching and Rash   Amlodipine     Felt she retained fluid in her chest    Amoxicillin Hives    Did it involve swelling of the face/tongue/throat, SOB, or low BP? No Did it involve sudden or severe rash/hives, skin peeling, or any reaction on the inside of your mouth or nose? No Did you need to seek medical attention at a hospital or doctor's office? No When did it last happen?      10 + years If all above answers are "NO", may proceed with cephalosporin use.    Doxycycline     " sick and weak"   Allopurinol Hives and Rash   Tramadol Hives and Rash    Current Outpatient Medications  Medication Sig Dispense Refill   acetaminophen (TYLENOL) 500 MG tablet Take 1,000 mg by mouth every 6 (six) hours as needed for moderate pain.     albuterol (VENTOLIN HFA) 108 (90 Base) MCG/ACT inhaler Inhale 2 puffs into the lungs every  6 (six) hours as needed for wheezing or shortness of breath. 18 g 1   apixaban (ELIQUIS) 5 MG TABS tablet Take 1 tablet by mouth 2 (two) times daily.     calcitRIOL (ROCALTROL) 0.25 MCG capsule Take 0.25 mcg by mouth 3 (three) times a week. Monday,Wednesday and Fridays     Cholecalciferol (VITAMIN D-3) 25 MCG (1000 UT) CAPS Take 1,000 Units by mouth daily.     Colchicine 0.6 MG CAPS Take by mouth as needed.     cyclobenzaprine (FLEXERIL) 5 MG tablet Take 5 mg by mouth 3 (three) times daily.     diclofenac Sodium (VOLTAREN) 1 % GEL Apply 1 application topically 4 (four) times daily as needed (pain).     flecainide (TAMBOCOR) 50 MG tablet TAKE 1 TABLET BY MOUTH TWICE A DAY 180 tablet 2   furosemide (LASIX) 20 MG tablet Take 20 mg by mouth daily. In the afternoon In addition  to her 40 mg once per day.     furosemide (LASIX) 40 MG tablet TAKE 1 TABLET BY MOUTH EVERY DAY (Patient taking differently: Take 40 mg by mouth daily. In addition to 20 mg in the afternoon.) 90 tablet 2   gabapentin (NEURONTIN) 100 MG capsule Take 1 capsule (100 mg total) by mouth 3 (three) times daily. 90 capsule 2   hydrocortisone (ANUSOL-HC) 2.5 % rectal cream PLACE 1 APPLICATION RECTALLY 2 (TWO) TIMES DAILY. 30 g 1   losartan (COZAAR) 50 MG tablet TAKE 1 TABLET BY MOUTH EVERY DAY 90 tablet 3   lubiprostone (AMITIZA) 24 MCG capsule TAKE 1 CAPSULE BY MOUTH EVERY MORNING AND AT BEDTIME 180 capsule 3   octreotide (SANDOSTATIN) 100 MCG/ML SOLN injection INJECT 1 ML (100 MCG TOTAL) UNDER THE SKIN EVERY 12 (TWELVE) HOURS. 60 mL 11   ondansetron (ZOFRAN) 4 MG tablet Take 1 tablet (4 mg total) by mouth every 6 (six) hours as needed for nausea. 20 tablet 0   pantoprazole (PROTONIX) 40 MG tablet Take 1 tablet (40 mg total) by mouth daily. 90 tablet 3   polyethylene glycol (MIRALAX / GLYCOLAX) 17 g packet Take 17 g by mouth daily as needed for mild constipation. 14 each 0   potassium chloride SA (KLOR-CON) 20 MEQ tablet Take 20 mEq by mouth in the morning, at noon, and at bedtime.     predniSONE (DELTASONE) 10 MG tablet Take 1 tablet (10 mg total) by mouth 3 (three) times daily. 42 tablet 0   rosuvastatin (CRESTOR) 10 MG tablet TAKE 1 TABLET BY MOUTH EVERY DAY 30 tablet 0   tiZANidine (ZANAFLEX) 4 MG tablet Take 1 tablet (4 mg total) by mouth every 6 (six) hours as needed for muscle spasms. 30 tablet 0   vitamin C (ASCORBIC ACID) 250 MG tablet Take 250 mg by mouth daily.     No current facility-administered medications for this visit.    REVIEW OF SYSTEMS:  [X]  denotes positive finding, [ ]  denotes negative finding Cardiac  Comments:  Chest pain or chest pressure:    Shortness of breath upon exertion:    Short of breath when lying flat:    Irregular heart rhythm:        Vascular    Pain in calf,  thigh, or hip brought on by ambulation:    Pain in feet at night that wakes you up from your sleep:     Blood clot in your veins:    Leg swelling:  PHYSICAL EXAM: Vitals:   06/19/23 1503  BP: 139/76  Pulse: 64  Temp: (!) 97.3 F (36.3 C)  SpO2: 97%  Weight: 178 lb 12.8 oz (81.1 kg)  Height: 5\' 2"  (1.575 m)    GENERAL: The patient is a well-nourished female, in no acute distress. The vital signs are documented above. CARDIOVASCULAR: I do not palpate pedal pulses bilaterally. PULMONARY: There is good air exchange  MUSCULOSKELETAL: There are no major deformities or cyanosis. NEUROLOGIC: No focal weakness or paresthesias are detected. SKIN: There are no ulcers or rashes noted. PSYCHIATRIC: The patient has a normal affect.  DATA:  Ankle arm index 0.5 bilaterally.  Toe brachial index diminished bilaterally.  MEDICAL ISSUES: Known bilateral moderate arterial insufficiency.  She does not have any evidence of tissue loss or arterial rest pain.  The discomfort in her right great metatarsal head has resolved and does not appear to be ischemic.  She will continue to keep a close eye on this and notify us should she have any new difficulty.  Otherwise we will see her in 1 year with repeat noninvasive studies    Larina Earthly, MD FACS [facility] [phone]  Note: Portions of this report may have been transcribed using voice recognition software.  Every effort has been made to ensure accuracy; however, inadvertent computerized transcription errors may still be present.

## 2023-06-22 ENCOUNTER — Other Ambulatory Visit: Payer: Self-pay

## 2023-06-22 DIAGNOSIS — I739 Peripheral vascular disease, unspecified: Secondary | ICD-10-CM

## 2023-07-02 ENCOUNTER — Inpatient Hospital Stay: Payer: Medicare HMO | Attending: Hematology

## 2023-07-02 DIAGNOSIS — N184 Chronic kidney disease, stage 4 (severe): Secondary | ICD-10-CM | POA: Diagnosis not present

## 2023-07-02 DIAGNOSIS — D509 Iron deficiency anemia, unspecified: Secondary | ICD-10-CM | POA: Diagnosis not present

## 2023-07-02 DIAGNOSIS — I129 Hypertensive chronic kidney disease with stage 1 through stage 4 chronic kidney disease, or unspecified chronic kidney disease: Secondary | ICD-10-CM | POA: Insufficient documentation

## 2023-07-02 DIAGNOSIS — D500 Iron deficiency anemia secondary to blood loss (chronic): Secondary | ICD-10-CM

## 2023-07-02 DIAGNOSIS — D649 Anemia, unspecified: Secondary | ICD-10-CM

## 2023-07-02 LAB — CBC WITH DIFFERENTIAL/PLATELET
Abs Immature Granulocytes: 0.01 10*3/uL (ref 0.00–0.07)
Basophils Absolute: 0 10*3/uL (ref 0.0–0.1)
Basophils Relative: 1 %
Eosinophils Absolute: 0.2 10*3/uL (ref 0.0–0.5)
Eosinophils Relative: 4 %
HCT: 36 % (ref 36.0–46.0)
Hemoglobin: 11 g/dL — ABNORMAL LOW (ref 12.0–15.0)
Immature Granulocytes: 0 %
Lymphocytes Relative: 39 %
Lymphs Abs: 1.8 10*3/uL (ref 0.7–4.0)
MCH: 28.8 pg (ref 26.0–34.0)
MCHC: 30.6 g/dL (ref 30.0–36.0)
MCV: 94.2 fL (ref 80.0–100.0)
Monocytes Absolute: 0.5 10*3/uL (ref 0.1–1.0)
Monocytes Relative: 10 %
Neutro Abs: 2.1 10*3/uL (ref 1.7–7.7)
Neutrophils Relative %: 46 %
Platelets: 301 10*3/uL (ref 150–400)
RBC: 3.82 MIL/uL — ABNORMAL LOW (ref 3.87–5.11)
RDW: 15.2 % (ref 11.5–15.5)
WBC: 4.5 10*3/uL (ref 4.0–10.5)
nRBC: 0 % (ref 0.0–0.2)

## 2023-07-02 LAB — COMPREHENSIVE METABOLIC PANEL
ALT: 13 U/L (ref 0–44)
AST: 21 U/L (ref 15–41)
Albumin: 3.8 g/dL (ref 3.5–5.0)
Alkaline Phosphatase: 75 U/L (ref 38–126)
Anion gap: 8 (ref 5–15)
BUN: 20 mg/dL (ref 8–23)
CO2: 29 mmol/L (ref 22–32)
Calcium: 8.7 mg/dL — ABNORMAL LOW (ref 8.9–10.3)
Chloride: 100 mmol/L (ref 98–111)
Creatinine, Ser: 1.77 mg/dL — ABNORMAL HIGH (ref 0.44–1.00)
GFR, Estimated: 29 mL/min — ABNORMAL LOW (ref >60)
Glucose, Bld: 110 mg/dL — ABNORMAL HIGH (ref 70–99)
Potassium: 4.1 mmol/L (ref 3.5–5.1)
Sodium: 137 mmol/L (ref 135–145)
Total Bilirubin: 0.6 mg/dL (ref 0.3–1.2)
Total Protein: 7 g/dL (ref 6.5–8.1)

## 2023-07-02 LAB — FERRITIN: Ferritin: 107 ng/mL (ref 11–307)

## 2023-07-02 LAB — IRON AND TIBC
Iron: 62 ug/dL (ref 28–170)
Saturation Ratios: 19 % (ref 10.4–31.8)
TIBC: 320 ug/dL (ref 250–450)
UIBC: 258 ug/dL

## 2023-07-09 ENCOUNTER — Encounter: Payer: Self-pay | Admitting: Internal Medicine

## 2023-07-09 ENCOUNTER — Ambulatory Visit: Payer: Medicare HMO | Attending: Internal Medicine | Admitting: Internal Medicine

## 2023-07-09 VITALS — BP 148/60 | HR 59 | Ht 62.0 in | Wt 178.6 lb

## 2023-07-09 DIAGNOSIS — I4891 Unspecified atrial fibrillation: Secondary | ICD-10-CM

## 2023-07-09 NOTE — Patient Instructions (Signed)

## 2023-07-09 NOTE — Progress Notes (Signed)
HPI [name] [name] returns today for followup of her atrial fib and HTN. She is a pleasant 77 yo woman with PAF, who I saw a couple of months ago. She was started on flecainide 75 mg twice daily but reduced to 50 mg twice daily. Her symptoms of atrial fib have essentially resolved with only rare break through. She denies chest pain or sob. She saw Dr.Lambert in consultation about a Watchman and he thought she was not a good candidate. She has gotten back on eliquis but has not had any bleeding. She denies any symptomatic atrial fib.   Allergies  Allergen Reactions   Acetaminophen-Codeine Hives, Itching and Rash   Amlodipine     Felt she retained fluid in her chest    Amoxicillin Hives    Did it involve swelling of the face/tongue/throat, SOB, or low BP? No Did it involve sudden or severe rash/hives, skin peeling, or any reaction on the inside of your mouth or nose? No Did you need to seek medical attention at a hospital or doctor's office? No When did it last happen?      10 + years If all above answers are "NO", may proceed with cephalosporin use.    Doxycycline     " sick and weak"   Allopurinol Hives and Rash   Tramadol Hives and Rash     Current Outpatient Medications  Medication Sig Dispense Refill   acetaminophen (TYLENOL) 500 MG tablet Take 1,000 mg by mouth every 6 (six) hours as needed for moderate pain.     albuterol (VENTOLIN HFA) 108 (90 Base) MCG/ACT inhaler Inhale 2 puffs into the lungs every 6 (six) hours as needed for wheezing or shortness of breath. 18 g 1   apixaban (ELIQUIS) 5 MG TABS tablet Take 1 tablet by mouth 2 (two) times daily.     calcitRIOL (ROCALTROL) 0.25 MCG capsule Take 0.25 mcg by mouth 3 (three) times a week. Monday,Wednesday and Fridays     Cholecalciferol (VITAMIN D-3) 25 MCG (1000 UT) CAPS Take 1,000 Units by mouth daily.     Colchicine 0.6 MG CAPS Take by mouth as needed.     cyclobenzaprine (FLEXERIL) 5 MG tablet Take 5 mg by mouth 3 (three)  times daily.     diclofenac Sodium (VOLTAREN) 1 % GEL Apply 1 application topically 4 (four) times daily as needed (pain).     flecainide (TAMBOCOR) 50 MG tablet TAKE 1 TABLET BY MOUTH TWICE A DAY 180 tablet 2   furosemide (LASIX) 20 MG tablet Take 20 mg by mouth daily. In the afternoon In addition to her 40 mg once per day.     furosemide (LASIX) 40 MG tablet TAKE 1 TABLET BY MOUTH EVERY DAY (Patient taking differently: Take 40 mg by mouth daily. In addition to 20 mg in the afternoon.) 90 tablet 2   gabapentin (NEURONTIN) 100 MG capsule Take 1 capsule (100 mg total) by mouth 3 (three) times daily. 90 capsule 2   hydrocortisone (ANUSOL-HC) 2.5 % rectal cream PLACE 1 APPLICATION RECTALLY 2 (TWO) TIMES DAILY. 30 g 1   losartan (COZAAR) 50 MG tablet TAKE 1 TABLET BY MOUTH EVERY DAY 90 tablet 3   lubiprostone (AMITIZA) 24 MCG capsule TAKE 1 CAPSULE BY MOUTH EVERY MORNING AND AT BEDTIME 180 capsule 3   octreotide (SANDOSTATIN) 100 MCG/ML SOLN injection INJECT 1 ML (100 MCG TOTAL) UNDER THE SKIN EVERY 12 (TWELVE) HOURS. 60 mL 11   ondansetron (ZOFRAN) 4 MG tablet  Take 1 tablet (4 mg total) by mouth every 6 (six) hours as needed for nausea. 20 tablet 0   pantoprazole (PROTONIX) 40 MG tablet Take 1 tablet (40 mg total) by mouth daily. 90 tablet 3   polyethylene glycol (MIRALAX / GLYCOLAX) 17 g packet Take 17 g by mouth daily as needed for mild constipation. 14 each 0   potassium chloride SA (KLOR-CON) 20 MEQ tablet Take 20 mEq by mouth in the morning, at noon, and at bedtime.     predniSONE (DELTASONE) 10 MG tablet Take 1 tablet (10 mg total) by mouth 3 (three) times daily. 42 tablet 0   rosuvastatin (CRESTOR) 10 MG tablet TAKE 1 TABLET BY MOUTH EVERY DAY 30 tablet 0   tiZANidine (ZANAFLEX) 4 MG tablet Take 1 tablet (4 mg total) by mouth every 6 (six) hours as needed for muscle spasms. 30 tablet 0   vitamin C (ASCORBIC ACID) 250 MG tablet Take 250 mg by mouth daily.     No current facility-administered  medications for this visit.     Past Medical History:  Diagnosis Date   Anemia    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HCC) 09/11/2022   Chronic kidney disease    Constipation 07/17/2017   GERD (gastroesophageal reflux disease)    Gout    Grade II diastolic dysfunction 11/23/2020   Hyperlipidemia    Hypertension    Hypokalemia    OSA (obstructive sleep apnea) 08/09/2020   PAF (paroxysmal atrial fibrillation) (HCC)    PAF (paroxysmal atrial fibrillation) (HCC)    PUD (peptic ulcer disease)    Syncope    Neurocardiogenic    ROS:   All systems reviewed and negative except as noted in the HPI.   Past Surgical History:  Procedure Laterality Date   ABDOMINAL AORTOGRAM W/LOWER EXTREMITY Right 12/13/2020   Procedure: ABDOMINAL AORTOGRAM W/LOWER EXTREMITY;  Surgeon: Nada Libman, MD;  [facility];  Service: Cardiovascular;  Laterality: Right;   ABDOMINAL HYSTERECTOMY     AMPUTATION Right 12/14/2020   Procedure: Right fifth toe amputation;  Surgeon: Larina Earthly, MD;  Location: [facility] OR;  Service: Vascular;  Laterality: Right;   CATARACT EXTRACTION Bilateral    COLONOSCOPY N/A 10/07/2014   Dr. Darrick Penna: redundant left colon, moderate sized external hemorrhoids    COLONOSCOPY N/A 03/11/2020   Surgeon: West Bali, MD;  9 polyps, majority of which were tubular adenomas, nodular mucosa in the anus s/p biopsy which was benign.   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   ESOPHAGOGASTRODUODENOSCOPY (EGD) WITH PROPOFOL N/A 11/25/2020    Surgeon: Corbin Ade, MD; Normal, s/p capsule deployment   GIVENS CAPSULE STUDY N/A 11/25/2020    Surgeon: Corbin Ade, MD; 4/nonbleeding AVMs in the proximal small bowel large fresh blood, 2 bleeding lymphangiectasis in SB.    HOT HEMOSTASIS  11/27/2020   Procedure: HOT HEMOSTASIS  (ARGON PLASMA COAGULATION/BICAP);  Surgeon: Marguerita Merles, Reuel Boom, MD;  [facility]: [facility];  Service: Gastroenterology;;   LIPOMA RESECTION     PERIPHERAL VASCULAR INTERVENTION Right 12/13/2020   Procedure: PERIPHERAL VASCULAR INTERVENTION;  Surgeon: Nada Libman, MD;  [facility]: [facility];  Service: Cardiovascular;  Laterality: Right;  SFA/PT/AT   POLYPECTOMY  03/11/2020   Procedure: POLYPECTOMY;  Surgeon: West Bali, MD;  Location: [facility];  Service: Endoscopy;;  cecal, transverse, descending, sigmoid   TEE WITHOUT CARDIOVERSION N/A 05/10/2021   Procedure: TRANSESOPHAGEAL ECHOCARDIOGRAM (TEE) WATCHMEN EVALUATION;  Surgeon: Wendall Stade, MD;  Location: [facility];  Service: Cardiovascular;  Laterality: N/A;     Family History  Problem Relation Age of Onset   Stroke Mother    Dementia Father    Cancer - Other Sister    Atrial fibrillation Sister    Heart failure Sister    Colon cancer Maternal Grandmother        diagnosed in her 58s     Social History   Socioeconomic History   Marital status: Single    Spouse name: Not on file   Number of children: Not on file   Years of education: Not on file   Highest education level: Not on file  Occupational History   Occupation: Retired  Tobacco Use   Smoking status: Former    Current packs/day: 0.00    Average packs/day: 1 pack/day for 33.6 years (33.6 ttl pk-yrs)    Types: Cigarettes    Start date: 03/16/1964    Quit date: 10/07/1997    Years since quitting: 25.7   Smokeless tobacco: Never  Vaping Use   Vaping status: Never Used  Substance and Sexual Activity   Alcohol use: No    Alcohol/week: 0.0 standard drinks of alcohol   Drug use: No   Sexual activity: Yes    Partners: Male  Other Topics Concern   Not on file  Social History Narrative   Not on file   Social Determinants of Health   Financial Resource Strain: Not on file  Food Insecurity: Not on file  Transportation Needs: Not on  file  Physical Activity: Not on file  Stress: Not on file  Social Connections: Not on file  Intimate Partner Violence: Not on file     BP (!) 148/60 (BP Location: Left Arm, Patient Position: Sitting, Cuff Size: Normal)   Pulse (!) 59   Ht 5\' 2"  (1.575 m)   Wt 178 lb 9.6 oz (81 kg)   SpO2 100%   BMI 32.67 kg/m   Physical Exam:  Well appearing NAD HEENT: Unremarkable Neck:  No JVD, no thyromegally Lymphatics:  No adenopathy Back:  No CVA tenderness Lungs:  Clear with no wheezes HEART:  Regular rate rhythm, no murmurs, no rubs, no clicks Abd:  soft, positive bowel sounds, no organomegally, no rebound, no guarding Ext:  2 plus pulses, no edema, no cyanosis, no clubbing Skin:  No rashes no nodules Neuro:  CN II through XII intact, motor grossly intact  EKG - sinus with non-specific IVCD  DEVICE  Normal device function.  See PaceArt for details.   Assess/Plan: 1. Atrial fib - her symptoms are much improved on flecainide. She will continue her current meds at 50 bid. If she has break through, I asked her to take an extra flecainide, up to 200 mg in a 24 hour period. 2. HTN - her bp is well controlled.  3. GI bleeding - she was prescribed octreotide and she has not had more bleeding and is on eliquis. She has not been thought to be a good candidate for anti-coagulation. 4. Obesity - I asked her to lose 10-15 lbs.      [name] Gowda Helmi Hechavarria,MD

## 2023-07-12 ENCOUNTER — Other Ambulatory Visit: Payer: Self-pay | Admitting: Internal Medicine

## 2023-07-16 ENCOUNTER — Encounter: Payer: Self-pay | Admitting: Hematology

## 2023-07-30 ENCOUNTER — Inpatient Hospital Stay: Payer: Medicare HMO

## 2023-07-30 ENCOUNTER — Inpatient Hospital Stay: Payer: Medicare HMO | Attending: Hematology | Admitting: Oncology

## 2023-07-30 VITALS — BP 131/58 | HR 64 | Temp 97.3°F | Resp 16 | Wt 180.6 lb

## 2023-07-30 DIAGNOSIS — D649 Anemia, unspecified: Secondary | ICD-10-CM

## 2023-07-30 DIAGNOSIS — I4891 Unspecified atrial fibrillation: Secondary | ICD-10-CM | POA: Insufficient documentation

## 2023-07-30 DIAGNOSIS — D631 Anemia in chronic kidney disease: Secondary | ICD-10-CM

## 2023-07-30 DIAGNOSIS — R200 Anesthesia of skin: Secondary | ICD-10-CM | POA: Diagnosis not present

## 2023-07-30 DIAGNOSIS — Z803 Family history of malignant neoplasm of breast: Secondary | ICD-10-CM | POA: Insufficient documentation

## 2023-07-30 DIAGNOSIS — N184 Chronic kidney disease, stage 4 (severe): Secondary | ICD-10-CM | POA: Insufficient documentation

## 2023-07-30 DIAGNOSIS — Z800 Family history of malignant neoplasm of digestive organs: Secondary | ICD-10-CM | POA: Insufficient documentation

## 2023-07-30 DIAGNOSIS — Z79899 Other long term (current) drug therapy: Secondary | ICD-10-CM | POA: Insufficient documentation

## 2023-07-30 DIAGNOSIS — D500 Iron deficiency anemia secondary to blood loss (chronic): Secondary | ICD-10-CM

## 2023-07-30 DIAGNOSIS — Z87891 Personal history of nicotine dependence: Secondary | ICD-10-CM | POA: Insufficient documentation

## 2023-07-30 DIAGNOSIS — K279 Peptic ulcer, site unspecified, unspecified as acute or chronic, without hemorrhage or perforation: Secondary | ICD-10-CM | POA: Insufficient documentation

## 2023-07-30 DIAGNOSIS — D509 Iron deficiency anemia, unspecified: Secondary | ICD-10-CM | POA: Diagnosis not present

## 2023-07-30 DIAGNOSIS — I129 Hypertensive chronic kidney disease with stage 1 through stage 4 chronic kidney disease, or unspecified chronic kidney disease: Secondary | ICD-10-CM | POA: Insufficient documentation

## 2023-07-30 LAB — CBC
HCT: 35.4 % — ABNORMAL LOW (ref 36.0–46.0)
Hemoglobin: 10.9 g/dL — ABNORMAL LOW (ref 12.0–15.0)
MCH: 28.6 pg (ref 26.0–34.0)
MCHC: 30.8 g/dL (ref 30.0–36.0)
MCV: 92.9 fL (ref 80.0–100.0)
Platelets: 266 10*3/uL (ref 150–400)
RBC: 3.81 MIL/uL — ABNORMAL LOW (ref 3.87–5.11)
RDW: 14.2 % (ref 11.5–15.5)
WBC: 4.4 10*3/uL (ref 4.0–10.5)
nRBC: 0 % (ref 0.0–0.2)

## 2023-07-30 MED ORDER — EPOETIN ALFA 10000 UNIT/ML IJ SOLN
10000.0000 [IU] | Freq: Once | INTRAMUSCULAR | Status: AC
  Administered 2023-07-30: 10000 [IU] via SUBCUTANEOUS
  Filled 2023-07-30: qty 1

## 2023-07-30 NOTE — Progress Notes (Unsigned)
[facility] [address], [place] [address]   CLINIC:  Medical Oncology/Hematology  PCP:  Doreatha Massed, MD [address] [place] [address] [phone]   REASON FOR VISIT:  Follow-up for normocytic anemia  CURRENT THERAPY: IV iron and Retacrit  INTERVAL HISTORY:   [name] [name] 77 y.o. 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  She was last seen by Rojelio Brenner PA-C on 05/06/23.   At today's visit, she reports feeling stable.  Reports feeling much better and having more energy after her IV iron back in late May and early June.  Denies any melena, hematochezia, epistaxis or gum bleeding.  Has had some shortness of breath but denies chest pain.  No ice pica.  Has noticed her energy levels are declining each day.  Appetite is 85%.  Tingling and numbness in her right foot.  She is currently still taking Eliquis daily for atrial fibrillation.  She continues monthly Epogen for hemoglobin less than 11.  ASSESSMENT & PLAN:  1.  Iron deficiency anemia - Seen at the request of Dr. Wolfgang Phoenix for evaluation and treatment of anemia in the setting of CKD stage IV and chronic iron deficiency from GI bleeding - She has had anemia since September 2021. 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 - She takes Protonix and self administers SQ octreotide twice daily.  She is on Eliquis for atrial fibrillation (cleared by GI). 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 - Hospitalized from 06/21/2022 through 06/23/2022 for symptomatic anemia.  Received 1 unit PRBC and IV Feraheme x500 mg during hospitalization. - Hematology work-up (07/02/2022): Mildly elevated reticulocytes 3.7%.  Normal LDH 140. CBC with Hgb at 9.7/MCV  92.2. Normal folate, copper, B12, MMA, homocystine. Negative SPEP and immunofixation.  Mildly elevated free light chain ratio in keeping with CKD stage IV. - She has intermittent bright red blood per rectum and melena, but this has resolved for the time being ever since small bowel endoscopy at [facility] in October 2023.  - Failed to improve on oral iron supplementation - Epogen/Retacrit protocol initiated by nephrology in February 2022 and requires intermittent injection.  Last in June 2024. - Received IV Venofer last on 05/13/2023, 05/24/2023 and 05/31/2023. - Labs from 07/30/2023 showed hemoglobin of 10.9 with MCV 92.9.  Had iron levels drawn on 07/02/2023 which showed a ferritin level of 107 and iron saturation of 19%. -Has baseline CKD stage IIIb/IV with creatinine of 1.77. -Based on symptoms recommend IV iron.  Will try and get lab work completed closer to next visit. -Recommend 3 doses of IV Venofer 300 mg and 10,000 units Epogen for hemoglobin less than 11. -Return to clinic in 3 months with follow-up with lab work and see a provider.  2.  Other history - PMH: CKD stage IV, secondary hyperparathyroidism, chronic diastolic CHF, atrial fibrillation (Eliquis), hypertension, GERD, peripheral arterial disease, mild COPD, and iron deficiency from GI bleeds  - SOCIAL: She is retired and lives at home alone.  She is able to ambulate short distances without difficulty and is independent with ADLs.  She is a former smoker, smokes 1 PPD for 25 years, quit in 1998.  She denies any alcohol or illicit drug use. - FAMILY: Family history positive for mother with unspecified  anemia.  Her sister had breast cancer.  Maternal grandmother also had breast cancer.  3.  Numbness to right foot: -Will recheck B12 levels at next visit. -Last B12 level was 505 on 07/02/2022.  PLAN SUMMARY: >> IV Venofer 300 mg x 3 >> Retacrit today. >> CBC + Possible Retacrit every 6 weeks >> Same day labs (CBC/D, CMP, ferritin,B12, MMA,  iron/TIBC) + OFFICE visit + injection in 3 months.     REVIEW OF SYSTEMS:  Review of Systems  Constitutional:  Positive for fatigue.  Respiratory:  Positive for shortness of breath.   Cardiovascular:  Positive for palpitations.  Neurological:  Positive for numbness (to right foot).     PHYSICAL EXAM:  ECOG PERFORMANCE STATUS: 2 - Symptomatic, <50% confined to bed  Vitals:   07/30/23 0945  BP: (!) 131/58  Pulse: 64  Resp: 16  Temp: (!) 97.3 F (36.3 C)  SpO2: 100%   Filed Weights   07/30/23 0945  Weight: 180 lb 9.6 oz (81.9 kg)   Physical Exam Constitutional:      Appearance: Normal appearance.  Cardiovascular:     Rate and Rhythm: Normal rate and regular rhythm.  Pulmonary:     Effort: Pulmonary effort is normal.     Breath sounds: Normal breath sounds.  Abdominal:     General: Bowel sounds are normal.     Palpations: Abdomen is soft.  Musculoskeletal:        General: No swelling. Normal range of motion.  Neurological:     Mental Status: She is alert and oriented to person, place, and time. Mental status is at baseline.     PAST MEDICAL/SURGICAL HISTORY:  Past Medical History:  Diagnosis Date   Anemia    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HCC) 09/11/2022   Chronic kidney disease    Constipation 07/17/2017   GERD (gastroesophageal reflux disease)    Gout    Grade II diastolic dysfunction 11/23/2020   Hyperlipidemia    Hypertension    Hypokalemia    OSA (obstructive sleep apnea) 08/09/2020   PAF (paroxysmal atrial fibrillation) (HCC)    PAF (paroxysmal atrial fibrillation) (HCC)    PUD (peptic ulcer disease)    Syncope    Neurocardiogenic   Past Surgical History:  Procedure Laterality Date   ABDOMINAL AORTOGRAM W/LOWER EXTREMITY Right 12/13/2020   Procedure: ABDOMINAL AORTOGRAM W/LOWER EXTREMITY;  Surgeon: Nada Libman, MD;  [facility];  Service: Cardiovascular;  Laterality: Right;   ABDOMINAL HYSTERECTOMY     AMPUTATION  Right 12/14/2020   Procedure: Right fifth toe amputation;  Surgeon: Larina Earthly, MD;  Location: [facility] OR;  Service: Vascular;  Laterality: Right;   CATARACT EXTRACTION Bilateral    COLONOSCOPY N/A 10/07/2014   Dr. Darrick Penna: redundant left colon, moderate sized external hemorrhoids    COLONOSCOPY N/A 03/11/2020   Surgeon: West Bali, MD;  9 polyps, majority of which were tubular adenomas, nodular mucosa in the anus s/p biopsy which was benign.   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   ESOPHAGOGASTRODUODENOSCOPY (EGD) WITH PROPOFOL N/A 11/25/2020    Surgeon: Corbin Ade, MD; Normal, s/p capsule deployment   GIVENS CAPSULE STUDY N/A 11/25/2020    Surgeon: Corbin Ade, MD; 4/nonbleeding AVMs in the proximal small bowel large fresh blood, 2 bleeding lymphangiectasis in SB.    HOT  HEMOSTASIS  11/27/2020   Procedure: HOT HEMOSTASIS (ARGON PLASMA COAGULATION/BICAP);  Surgeon: Marguerita Merles, Reuel Boom, MD;  [facility];  Service: Gastroenterology;;   LIPOMA RESECTION     PERIPHERAL VASCULAR INTERVENTION Right 12/13/2020   Procedure: PERIPHERAL VASCULAR INTERVENTION;  Surgeon: Nada Libman, MD;  [facility];  Service: Cardiovascular;  Laterality: Right;  SFA/PT/AT   POLYPECTOMY  03/11/2020   Procedure: POLYPECTOMY;  Surgeon: West Bali, MD;  [facility]: [facility];  Service: Endoscopy;;  cecal, transverse, descending, sigmoid   TEE WITHOUT CARDIOVERSION N/A 05/10/2021   Procedure: TRANSESOPHAGEAL ECHOCARDIOGRAM (TEE) WATCHMEN EVALUATION;  Surgeon: Wendall Stade, MD;  Location: [facility] ENDOSCOPY;  Service: Cardiovascular;  Laterality: N/A;    SOCIAL HISTORY:  Social History   Socioeconomic History   Marital status: Single    Spouse name: Not on file   Number of children: Not on file   Years of education: Not  on file   Highest education level: Not on file  Occupational History   Occupation: Retired  Tobacco Use   Smoking status: Former    Current packs/day: 0.00    Average packs/day: 1 pack/day for 33.6 years (33.6 ttl pk-yrs)    Types: Cigarettes    Start date: 03/16/1964    Quit date: 10/07/1997    Years since quitting: 25.8   Smokeless tobacco: Never  Vaping Use   Vaping status: Never Used  Substance and Sexual Activity   Alcohol use: No    Alcohol/week: 0.0 standard drinks of alcohol   Drug use: No   Sexual activity: Yes    Partners: Male  Other Topics Concern   Not on file  Social History Narrative   Not on file   Social Determinants of Health   Financial Resource Strain: Not on file  Food Insecurity: Not on file  Transportation Needs: Not on file  Physical Activity: Not on file  Stress: Not on file  Social Connections: Not on file  Intimate Partner Violence: Not on file    FAMILY HISTORY:  Family History  Problem Relation Age of Onset   Stroke Mother    Dementia Father    Cancer - Other Sister    Atrial fibrillation Sister    Heart failure Sister    Colon cancer Maternal Grandmother        diagnosed in her 50s    CURRENT MEDICATIONS:  Outpatient Encounter Medications as of 07/30/2023  Medication Sig Note   acetaminophen (TYLENOL) 500 MG tablet Take 1,000 mg by mouth every 6 (six) hours as needed for moderate pain.    albuterol (VENTOLIN HFA) 108 (90 Base) MCG/ACT inhaler Inhale 2 puffs into the lungs every 6 (six) hours as needed for wheezing or shortness of breath.    apixaban (ELIQUIS) 5 MG TABS tablet Take 1 tablet by mouth 2 (two) times daily.    calcitRIOL (ROCALTROL) 0.25 MCG capsule Take 0.25 mcg by mouth 3 (three) times a week. Monday,Wednesday and Fridays    Cholecalciferol (VITAMIN D-3) 25 MCG (1000 UT) CAPS Take 1,000 Units by mouth daily.    Colchicine 0.6 MG CAPS Take by mouth as needed. 07/09/2023: PRN   cyclobenzaprine (FLEXERIL) 5 MG tablet Take  5 mg by mouth 3 (three) times daily. 06/18/2023: As needed per patient.    diclofenac Sodium (VOLTAREN) 1 % GEL Apply 1 application topically 4 (four) times daily as needed (pain).    flecainide (TAMBOCOR) 50 MG tablet TAKE 1 TABLET BY MOUTH TWICE A  DAY    furosemide (LASIX) 20 MG tablet Take 20 mg by mouth daily. In the afternoon In addition to her 40 mg once per day.    furosemide (LASIX) 40 MG tablet TAKE 1 TABLET BY MOUTH EVERY DAY (Patient taking differently: Take 40 mg by mouth daily. In addition to 20 mg in the afternoon.)    gabapentin (NEURONTIN) 100 MG capsule Take 1 capsule (100 mg total) by mouth 3 (three) times daily. 06/18/2023: prn   hydrocortisone (ANUSOL-HC) 2.5 % rectal cream PLACE 1 APPLICATION RECTALLY 2 (TWO) TIMES DAILY.    losartan (COZAAR) 50 MG tablet TAKE 1 TABLET BY MOUTH EVERY DAY    lubiprostone (AMITIZA) 24 MCG capsule TAKE 1 CAPSULE BY MOUTH EVERY MORNING AND AT BEDTIME    octreotide (SANDOSTATIN) 100 MCG/ML SOLN injection INJECT 1 ML (100 MCG TOTAL) UNDER THE SKIN EVERY 12 (TWELVE) HOURS.    ondansetron (ZOFRAN) 4 MG tablet Take 1 tablet (4 mg total) by mouth every 6 (six) hours as needed for nausea.    pantoprazole (PROTONIX) 40 MG tablet Take 1 tablet (40 mg total) by mouth daily.    polyethylene glycol (MIRALAX / GLYCOLAX) 17 g packet Take 17 g by mouth daily as needed for mild constipation.    potassium chloride SA (KLOR-CON) 20 MEQ tablet Take 20 mEq by mouth in the morning, at noon, and at bedtime.    predniSONE (DELTASONE) 10 MG tablet Take 1 tablet (10 mg total) by mouth 3 (three) times daily. 06/18/2023: As needed.    rosuvastatin (CRESTOR) 10 MG tablet TAKE 1 TABLET BY MOUTH EVERY DAY    tiZANidine (ZANAFLEX) 4 MG tablet Take 1 tablet (4 mg total) by mouth every 6 (six) hours as needed for muscle spasms. 07/09/2023: AS NEEDED   vitamin C (ASCORBIC ACID) 250 MG tablet Take 250 mg by mouth daily.    No facility-administered encounter medications on file as of  07/30/2023.    ALLERGIES:  Allergies  Allergen Reactions   Acetaminophen-Codeine Hives, Itching and Rash   Amlodipine     Felt she retained fluid in her chest    Amoxicillin Hives    Did it involve swelling of the face/tongue/throat, SOB, or low BP? No Did it involve sudden or severe rash/hives, skin peeling, or any reaction on the inside of your mouth or nose? No Did you need to seek medical attention at a hospital or doctor's office? No When did it last happen?      10 + years If all above answers are "NO", may proceed with cephalosporin use.    Doxycycline     " sick and weak"   Allopurinol Hives and Rash   Tramadol Hives and Rash    LABORATORY DATA:  I have reviewed the labs as listed.  CBC    Component Value Date/Time   WBC 4.4 07/30/2023 0900   RBC 3.81 (L) 07/30/2023 0900   HGB 10.9 (L) 07/30/2023 0900   HCT 35.4 (L) 07/30/2023 0900   PLT 266 07/30/2023 0900   MCV 92.9 07/30/2023 0900   MCH 28.6 07/30/2023 0900   MCHC 30.8 07/30/2023 0900   RDW 14.2 07/30/2023 0900   LYMPHSABS 1.8 07/02/2023 0757   MONOABS 0.5 07/02/2023 0757   EOSABS 0.2 07/02/2023 0757   BASOSABS 0.0 07/02/2023 0757      Latest Ref Rng & Units 07/02/2023    7:57 AM 05/06/2023    8:48 AM 02/08/2023    3:03 PM  CMP  Glucose 70 -  99 mg/dL 161  096  045   BUN 8 - 23 mg/dL 20  28  23    Creatinine 0.44 - 1.00 mg/dL 4.09  8.11  9.14   Sodium 135 - 145 mmol/L 137  136  139   Potassium 3.5 - 5.1 mmol/L 4.1  4.0  4.4   Chloride 98 - 111 mmol/L 100  101  98   CO2 22 - 32 mmol/L 29  26  30    Calcium 8.9 - 10.3 mg/dL 8.7  9.0  9.0   Total Protein 6.5 - 8.1 g/dL 7.0  7.4  7.0   Total Bilirubin 0.3 - 1.2 mg/dL 0.6  0.6  0.7   Alkaline Phos 38 - 126 U/L 75  95  82   AST 15 - 41 U/L 21  25  23    ALT 0 - 44 U/L 13  14  11      DIAGNOSTIC IMAGING:  I have independently reviewed the relevant imaging and discussed with the patient.   WRAP UP:  All questions were answered. The patient knows to call the  clinic with any problems, questions or concerns.  Medical decision making: Low  Time spent on visit: I spent 25 minutes dedicated to the care of this patient (face-to-face and non-face-to-face) on the date of the encounter to include what is described in the assessment and plan.  Mauro Kaufmann, NP  07/30/23 9:59 AM

## 2023-07-30 NOTE — Progress Notes (Signed)
Patient presents today for Retacrit injection per providers order.  Vital signs and labs reviewed by NP.  Message received from Durenda Hurt NP patient okay for Retacrit injection.  Stable during administration without incident; injection site WNL; see MAR for injection details.  Patient tolerated procedure well and without incident.  No questions or complaints noted at this time.

## 2023-07-30 NOTE — Patient Instructions (Signed)
[facility] [place]  Discharge Instructions: Thank you for choosing [facility] to provide your oncology and hematology care.  If you have a lab appointment with the Cancer Center - please note that after April 8th, 2024, all labs will be drawn in the cancer center.  You do not have to check in or register with the main entrance as you have in the past but will complete your check-in in the cancer center.  Wear comfortable clothing and clothing appropriate for easy access to any Portacath or PICC line.   We strive to give you quality time with your provider. You may need to reschedule your appointment if you arrive late (15 or more minutes).  Arriving late affects you and other patients whose appointments are after yours.  Also, if you miss three or more appointments without notifying the office, you may be dismissed from the clinic at the provider's discretion.      For prescription refill requests, have your pharmacy contact our office and allow 72 hours for refills to be completed.    Today you received the following chemotherapy and/or immunotherapy agents Retacrit      To help prevent nausea and vomiting after your treatment, we encourage you to take your nausea medication as directed.  BELOW ARE SYMPTOMS THAT SHOULD BE REPORTED IMMEDIATELY: *FEVER GREATER THAN 100.4 F (38 C) OR HIGHER *CHILLS OR SWEATING *NAUSEA AND VOMITING THAT IS NOT CONTROLLED WITH YOUR NAUSEA MEDICATION *UNUSUAL SHORTNESS OF BREATH *UNUSUAL BRUISING OR BLEEDING *URINARY PROBLEMS (pain or burning when urinating, or frequent urination) *BOWEL PROBLEMS (unusual diarrhea, constipation, pain near the anus) TENDERNESS IN MOUTH AND THROAT WITH OR WITHOUT PRESENCE OF ULCERS (sore throat, sores in mouth, or a toothache) UNUSUAL RASH, SWELLING OR PAIN  UNUSUAL VAGINAL DISCHARGE OR ITCHING   Items with * indicate a potential emergency and should be followed up as soon as possible or go to the  Emergency Department if any problems should occur.  Please show the CHEMOTHERAPY ALERT CARD or IMMUNOTHERAPY ALERT CARD at check-in to the Emergency Department and triage nurse.  Should you have questions after your visit or need to cancel or reschedule your appointment, please contact [facility] [phone]  and follow the prompts.  Office hours are 8:00 a.m. to 4:30 p.m. Monday - Friday. Please note that voicemails left after 4:00 p.m. may not be returned until the following business day.  We are closed weekends and major holidays. You have access to a nurse at all times for urgent questions. Please call the main number to the clinic [phone] and follow the prompts.  For any non-urgent questions, you may also contact your provider using MyChart. We now offer e-Visits for anyone 18 and older to request care online for non-urgent symptoms. For details visit [email or web address].   Also download the MyChart app! Go to the app store, search "MyChart", open the app, select , and log in with your MyChart username and password.   

## 2023-07-31 ENCOUNTER — Encounter: Payer: Self-pay | Admitting: Hematology

## 2023-08-06 ENCOUNTER — Inpatient Hospital Stay: Payer: Medicare HMO

## 2023-08-06 VITALS — BP 160/58 | HR 66 | Temp 95.0°F | Resp 18

## 2023-08-06 DIAGNOSIS — D500 Iron deficiency anemia secondary to blood loss (chronic): Secondary | ICD-10-CM

## 2023-08-06 DIAGNOSIS — I129 Hypertensive chronic kidney disease with stage 1 through stage 4 chronic kidney disease, or unspecified chronic kidney disease: Secondary | ICD-10-CM | POA: Diagnosis not present

## 2023-08-06 DIAGNOSIS — D631 Anemia in chronic kidney disease: Secondary | ICD-10-CM

## 2023-08-06 MED ORDER — SODIUM CHLORIDE 0.9 % IV SOLN
300.0000 mg | Freq: Once | INTRAVENOUS | Status: AC
  Administered 2023-08-06: 300 mg via INTRAVENOUS
  Filled 2023-08-06: qty 300

## 2023-08-06 MED ORDER — SODIUM CHLORIDE 0.9 % IV SOLN: INTRAVENOUS | Status: DC

## 2023-08-06 NOTE — Progress Notes (Signed)
Patient tolerated iron infusion with no complaints voiced.  Peripheral IV site clean and dry with good blood return noted before and after infusion.  Band aid applied.  VSS with discharge and left in satisfactory condition with no s/s of distress noted.   

## 2023-08-06 NOTE — Patient Instructions (Signed)

## 2023-08-13 ENCOUNTER — Inpatient Hospital Stay: Payer: Medicare HMO

## 2023-08-13 VITALS — BP 139/77 | HR 75 | Temp 98.1°F | Resp 18

## 2023-08-13 DIAGNOSIS — D500 Iron deficiency anemia secondary to blood loss (chronic): Secondary | ICD-10-CM

## 2023-08-13 DIAGNOSIS — I129 Hypertensive chronic kidney disease with stage 1 through stage 4 chronic kidney disease, or unspecified chronic kidney disease: Secondary | ICD-10-CM | POA: Diagnosis not present

## 2023-08-13 DIAGNOSIS — D631 Anemia in chronic kidney disease: Secondary | ICD-10-CM

## 2023-08-13 MED ORDER — SODIUM CHLORIDE 0.9 % IV SOLN
300.0000 mg | Freq: Once | INTRAVENOUS | Status: AC
  Administered 2023-08-13: 300 mg via INTRAVENOUS
  Filled 2023-08-13: qty 300

## 2023-08-13 MED ORDER — SODIUM CHLORIDE 0.9 % IV SOLN: INTRAVENOUS | Status: DC

## 2023-08-13 NOTE — Progress Notes (Signed)
Patient presents today for iron infusion of Venofer.  Patient is in satisfactory condition with no new complaints voiced.  Vital signs are stable.  Patient reports taking pre-meds of Tylenol and Zrytec prior to coming at 12:45. We will proceed with infusion per provider orders.

## 2023-08-13 NOTE — Patient Instructions (Signed)
 [facility]  Discharge Instructions: Thank you for choosing [facility] to provide your oncology and hematology care.  If you have a lab appointment with the Cancer Center - please note that after April 8th, 2024, all labs will be drawn in the cancer center.  You do not have to check in or register with the main entrance as you have in the past but will complete your check-in in the cancer center.  Wear comfortable clothing and clothing appropriate for easy access to any Portacath or PICC line.   We strive to give you quality time with your provider. You may need to reschedule your appointment if you arrive late (15 or more minutes).  Arriving late affects you and other patients whose appointments are after yours.  Also, if you miss three or more appointments without notifying the office, you may be dismissed from the clinic at the provider's discretion.      For prescription refill requests, have your pharmacy contact our office and allow 72 hours for refills to be completed.    Today you received the following chemotherapy and/or immunotherapy agents Venofer. Iron Sucrose Injection What is this medication? IRON SUCROSE (EYE ern SOO krose) treats low levels of iron (iron deficiency anemia) in people with kidney disease. Iron is a mineral that plays an important role in making red blood cells, which carry oxygen from your lungs to the rest of your body. This medicine may be used for other purposes; ask your health care provider or pharmacist if you have questions. COMMON BRAND NAME(S): Venofer What should I tell my care team before I take this medication? They need to know if you have any of these conditions: Anemia not caused by low iron levels Heart disease High levels of iron in the blood Kidney disease Liver disease An unusual or allergic reaction to iron, other medications, foods, dyes, or preservatives Pregnant or trying to get  pregnant Breastfeeding How should I use this medication? This medication is for infusion into a vein. It is given in a hospital or clinic setting. Talk to your care team about the use of this medication in children. While this medication may be prescribed for children as young as 2 years for selected conditions, precautions do apply. Overdosage: If you think you have taken too much of this medicine contact a poison control center or emergency room at once. NOTE: This medicine is only for you. Do not share this medicine with others. What if I miss a dose? Keep appointments for follow-up doses. It is important not to miss your dose. Call your care team if you are unable to keep an appointment. What may interact with this medication? Do not take this medication with any of the following: Deferoxamine Dimercaprol Other iron products This medication may also interact with the following: Chloramphenicol Deferasirox This list may not describe all possible interactions. Give your health care provider a list of all the medicines, herbs, non-prescription drugs, or dietary supplements you use. Also tell them if you smoke, drink alcohol, or use illegal drugs. Some items may interact with your medicine. What should I watch for while using this medication? Visit your care team regularly. Tell your care team if your symptoms do not start to get better or if they get worse. You may need blood work done while you are taking this medication. You may need to follow a special diet. Talk to your care team. Foods that contain iron include: whole grains/cereals, dried  fruits, beans, or peas, leafy green vegetables, and organ meats (liver, kidney). What side effects may I notice from receiving this medication? Side effects that you should report to your care team as soon as possible: Allergic reactions--skin rash, itching, hives, swelling of the face, lips, tongue, or throat Low blood pressure--dizziness, feeling  faint or lightheaded, blurry vision Shortness of breath Side effects that usually do not require medical attention (report to your care team if they continue or are bothersome): Flushing Headache Joint pain Muscle pain Nausea Pain, redness, or irritation at injection site This list may not describe all possible side effects. Call your doctor for medical advice about side effects. You may report side effects to FDA at 1-800-FDA-1088. Where should I keep my medication? This medication is given in a hospital or clinic. It will not be stored at home. NOTE: This sheet is a summary. It may not cover all possible information. If you have questions about this medicine, talk to your doctor, pharmacist, or health care provider.  2024 Elsevier/Gold Standard (2023-05-17 00:00:00)       To help prevent nausea and vomiting after your treatment, we encourage you to take your nausea medication as directed.  BELOW ARE SYMPTOMS THAT SHOULD BE REPORTED IMMEDIATELY: *FEVER GREATER THAN 100.4 F (38 C) OR HIGHER *CHILLS OR SWEATING *NAUSEA AND VOMITING THAT IS NOT CONTROLLED WITH YOUR NAUSEA MEDICATION *UNUSUAL SHORTNESS OF BREATH *UNUSUAL BRUISING OR BLEEDING *URINARY PROBLEMS (pain or burning when urinating, or frequent urination) *BOWEL PROBLEMS (unusual diarrhea, constipation, pain near the anus) TENDERNESS IN MOUTH AND THROAT WITH OR WITHOUT PRESENCE OF ULCERS (sore throat, sores in mouth, or a toothache) UNUSUAL RASH, SWELLING OR PAIN  UNUSUAL VAGINAL DISCHARGE OR ITCHING   Items with * indicate a potential emergency and should be followed up as soon as possible or go to the Emergency Department if any problems should occur.  Please show the CHEMOTHERAPY ALERT CARD or IMMUNOTHERAPY ALERT CARD at check-in to the Emergency Department and triage nurse.  Should you have questions after your visit or need to cancel or reschedule your appointment, please contact [facility]  [phone]  and follow the prompts.  Office hours are 8:00 a.m. to 4:30 p.m. Monday - Friday. Please note that voicemails left after 4:00 p.m. may not be returned until the following business day.  We are closed weekends and major holidays. You have access to a nurse at all times for urgent questions. Please call the main number to the clinic [phone] and follow the prompts.  For any non-urgent questions, you may also contact your provider using MyChart. We now offer e-Visits for anyone 9 and older to request care online for non-urgent symptoms. For details visit [email or web address].   Also download the MyChart app! Go to the app store, search "MyChart", open the app, select [facility], and log in with your MyChart username and password.

## 2023-08-13 NOTE — Progress Notes (Signed)
Venofer given today per MD orders. Tolerated fusion without adverse affects. Vital signs stable. No complaints at this time. Discharged from clinic ambulatory in stable condition. Alert and oriented x 3. F/U with [facility] as scheduled.

## 2023-08-19 ENCOUNTER — Other Ambulatory Visit: Payer: Self-pay | Admitting: Internal Medicine

## 2023-08-20 ENCOUNTER — Inpatient Hospital Stay: Payer: Medicare HMO

## 2023-08-20 VITALS — BP 134/73 | HR 53 | Temp 96.7°F | Resp 18

## 2023-08-20 DIAGNOSIS — I129 Hypertensive chronic kidney disease with stage 1 through stage 4 chronic kidney disease, or unspecified chronic kidney disease: Secondary | ICD-10-CM | POA: Diagnosis not present

## 2023-08-20 DIAGNOSIS — D631 Anemia in chronic kidney disease: Secondary | ICD-10-CM

## 2023-08-20 DIAGNOSIS — D500 Iron deficiency anemia secondary to blood loss (chronic): Secondary | ICD-10-CM

## 2023-08-20 MED ORDER — SODIUM CHLORIDE 0.9 % IV SOLN: Freq: Once | INTRAVENOUS | Status: AC

## 2023-08-20 MED ORDER — SODIUM CHLORIDE 0.9% FLUSH: 3.0000 mL | Freq: Once | INTRAVENOUS | Status: DC | PRN

## 2023-08-20 MED ORDER — SODIUM CHLORIDE 0.9 % IV SOLN
300.0000 mg | Freq: Once | INTRAVENOUS | Status: AC
  Administered 2023-08-20: 300 mg via INTRAVENOUS
  Filled 2023-08-20: qty 300

## 2023-08-20 MED ORDER — SODIUM CHLORIDE 0.9% FLUSH: 10.0000 mL | Freq: Once | INTRAVENOUS | Status: DC | PRN

## 2023-08-20 MED ORDER — ALTEPLASE 2 MG IJ SOLR: 2.0000 mg | Freq: Once | INTRAMUSCULAR | Status: DC | PRN

## 2023-08-20 MED ORDER — HEPARIN SOD (PORK) LOCK FLUSH 100 UNIT/ML IV SOLN: 250.0000 [IU] | Freq: Once | INTRAVENOUS | Status: DC | PRN

## 2023-08-20 MED ORDER — HEPARIN SOD (PORK) LOCK FLUSH 100 UNIT/ML IV SOLN: 500.0000 [IU] | Freq: Once | INTRAVENOUS | Status: DC | PRN

## 2023-08-20 NOTE — Patient Instructions (Signed)

## 2023-08-20 NOTE — Progress Notes (Signed)
Patient took own premeds for infusion.

## 2023-08-30 ENCOUNTER — Encounter: Payer: Self-pay | Admitting: Hematology

## 2023-09-09 ENCOUNTER — Other Ambulatory Visit: Payer: Self-pay

## 2023-09-09 DIAGNOSIS — D500 Iron deficiency anemia secondary to blood loss (chronic): Secondary | ICD-10-CM

## 2023-09-09 DIAGNOSIS — N184 Chronic kidney disease, stage 4 (severe): Secondary | ICD-10-CM

## 2023-09-10 ENCOUNTER — Inpatient Hospital Stay: Payer: Medicare HMO | Attending: Hematology

## 2023-09-10 ENCOUNTER — Ambulatory Visit: Payer: Medicare HMO | Admitting: Physician Assistant

## 2023-09-10 ENCOUNTER — Inpatient Hospital Stay: Payer: Medicare HMO

## 2023-09-10 ENCOUNTER — Other Ambulatory Visit: Payer: Medicare HMO

## 2023-09-10 DIAGNOSIS — D509 Iron deficiency anemia, unspecified: Secondary | ICD-10-CM | POA: Insufficient documentation

## 2023-09-10 DIAGNOSIS — N184 Chronic kidney disease, stage 4 (severe): Secondary | ICD-10-CM | POA: Insufficient documentation

## 2023-09-10 DIAGNOSIS — D631 Anemia in chronic kidney disease: Secondary | ICD-10-CM | POA: Insufficient documentation

## 2023-09-13 ENCOUNTER — Inpatient Hospital Stay: Payer: Medicare HMO

## 2023-09-13 DIAGNOSIS — N184 Chronic kidney disease, stage 4 (severe): Secondary | ICD-10-CM | POA: Diagnosis not present

## 2023-09-13 DIAGNOSIS — D509 Iron deficiency anemia, unspecified: Secondary | ICD-10-CM | POA: Diagnosis present

## 2023-09-13 DIAGNOSIS — D631 Anemia in chronic kidney disease: Secondary | ICD-10-CM

## 2023-09-13 DIAGNOSIS — D500 Iron deficiency anemia secondary to blood loss (chronic): Secondary | ICD-10-CM

## 2023-09-13 LAB — CBC WITH DIFFERENTIAL/PLATELET
Abs Immature Granulocytes: 0.01 10*3/uL (ref 0.00–0.07)
Basophils Absolute: 0 10*3/uL (ref 0.0–0.1)
Basophils Relative: 1 %
Eosinophils Absolute: 0.2 10*3/uL (ref 0.0–0.5)
Eosinophils Relative: 4 %
HCT: 40.1 % (ref 36.0–46.0)
Hemoglobin: 12.4 g/dL (ref 12.0–15.0)
Immature Granulocytes: 0 %
Lymphocytes Relative: 38 %
Lymphs Abs: 1.7 10*3/uL (ref 0.7–4.0)
MCH: 28.8 pg (ref 26.0–34.0)
MCHC: 30.9 g/dL (ref 30.0–36.0)
MCV: 93 fL (ref 80.0–100.0)
Monocytes Absolute: 0.4 10*3/uL (ref 0.1–1.0)
Monocytes Relative: 9 %
Neutro Abs: 2.2 10*3/uL (ref 1.7–7.7)
Neutrophils Relative %: 48 %
Platelets: 268 10*3/uL (ref 150–400)
RBC: 4.31 MIL/uL (ref 3.87–5.11)
RDW: 13.8 % (ref 11.5–15.5)
WBC: 4.5 10*3/uL (ref 4.0–10.5)
nRBC: 0 % (ref 0.0–0.2)

## 2023-09-13 LAB — COMPREHENSIVE METABOLIC PANEL
ALT: 10 U/L (ref 0–44)
AST: 19 U/L (ref 15–41)
Albumin: 4 g/dL (ref 3.5–5.0)
Alkaline Phosphatase: 78 U/L (ref 38–126)
Anion gap: 6 (ref 5–15)
BUN: 27 mg/dL — ABNORMAL HIGH (ref 8–23)
CO2: 28 mmol/L (ref 22–32)
Calcium: 8.7 mg/dL — ABNORMAL LOW (ref 8.9–10.3)
Chloride: 102 mmol/L (ref 98–111)
Creatinine, Ser: 1.68 mg/dL — ABNORMAL HIGH (ref 0.44–1.00)
GFR, Estimated: 31 mL/min — ABNORMAL LOW (ref >60)
Glucose, Bld: 107 mg/dL — ABNORMAL HIGH (ref 70–99)
Potassium: 4.1 mmol/L (ref 3.5–5.1)
Sodium: 136 mmol/L (ref 135–145)
Total Bilirubin: 0.5 mg/dL (ref 0.3–1.2)
Total Protein: 7.1 g/dL (ref 6.5–8.1)

## 2023-09-13 LAB — IRON AND TIBC
Iron: 58 ug/dL (ref 28–170)
Saturation Ratios: 20 % (ref 10.4–31.8)
TIBC: 291 ug/dL (ref 250–450)
UIBC: 233 ug/dL

## 2023-09-13 LAB — FERRITIN: Ferritin: 240 ng/mL (ref 11–307)

## 2023-09-13 LAB — VITAMIN B12: Vitamin B-12: 1442 pg/mL — ABNORMAL HIGH (ref 180–914)

## 2023-09-13 NOTE — Progress Notes (Signed)
No Retacrit today per providers order parameters.

## 2023-09-17 LAB — METHYLMALONIC ACID, SERUM: Methylmalonic Acid, Quantitative: 166 nmol/L (ref 0–378)

## 2023-10-18 ENCOUNTER — Other Ambulatory Visit: Payer: Self-pay

## 2023-10-18 DIAGNOSIS — D500 Iron deficiency anemia secondary to blood loss (chronic): Secondary | ICD-10-CM

## 2023-10-18 DIAGNOSIS — D631 Anemia in chronic kidney disease: Secondary | ICD-10-CM

## 2023-10-18 NOTE — Progress Notes (Signed)
Lab orders entered

## 2023-10-21 ENCOUNTER — Inpatient Hospital Stay: Payer: Medicare HMO | Attending: Hematology

## 2023-10-21 DIAGNOSIS — N184 Chronic kidney disease, stage 4 (severe): Secondary | ICD-10-CM | POA: Diagnosis not present

## 2023-10-21 DIAGNOSIS — D631 Anemia in chronic kidney disease: Secondary | ICD-10-CM | POA: Insufficient documentation

## 2023-10-21 DIAGNOSIS — D509 Iron deficiency anemia, unspecified: Secondary | ICD-10-CM | POA: Insufficient documentation

## 2023-10-21 DIAGNOSIS — D500 Iron deficiency anemia secondary to blood loss (chronic): Secondary | ICD-10-CM

## 2023-10-21 DIAGNOSIS — D649 Anemia, unspecified: Secondary | ICD-10-CM

## 2023-10-21 LAB — CBC WITH DIFFERENTIAL/PLATELET
Abs Immature Granulocytes: 0.02 10*3/uL (ref 0.00–0.07)
Basophils Absolute: 0 10*3/uL (ref 0.0–0.1)
Basophils Relative: 1 %
Eosinophils Absolute: 0.2 10*3/uL (ref 0.0–0.5)
Eosinophils Relative: 3 %
HCT: 35.9 % — ABNORMAL LOW (ref 36.0–46.0)
Hemoglobin: 11 g/dL — ABNORMAL LOW (ref 12.0–15.0)
Immature Granulocytes: 0 %
Lymphocytes Relative: 40 %
Lymphs Abs: 2.5 10*3/uL (ref 0.7–4.0)
MCH: 28.9 pg (ref 26.0–34.0)
MCHC: 30.6 g/dL (ref 30.0–36.0)
MCV: 94.5 fL (ref 80.0–100.0)
Monocytes Absolute: 0.6 10*3/uL (ref 0.1–1.0)
Monocytes Relative: 9 %
Neutro Abs: 2.9 10*3/uL (ref 1.7–7.7)
Neutrophils Relative %: 47 %
Platelets: 274 10*3/uL (ref 150–400)
RBC: 3.8 MIL/uL — ABNORMAL LOW (ref 3.87–5.11)
RDW: 14.6 % (ref 11.5–15.5)
WBC: 6.2 10*3/uL (ref 4.0–10.5)
nRBC: 0 % (ref 0.0–0.2)

## 2023-10-21 LAB — COMPREHENSIVE METABOLIC PANEL
ALT: 21 U/L (ref 0–44)
AST: 33 U/L (ref 15–41)
Albumin: 3.7 g/dL (ref 3.5–5.0)
Alkaline Phosphatase: 74 U/L (ref 38–126)
Anion gap: 7 (ref 5–15)
BUN: 22 mg/dL (ref 8–23)
CO2: 31 mmol/L (ref 22–32)
Calcium: 8.3 mg/dL — ABNORMAL LOW (ref 8.9–10.3)
Chloride: 99 mmol/L (ref 98–111)
Creatinine, Ser: 1.54 mg/dL — ABNORMAL HIGH (ref 0.44–1.00)
GFR, Estimated: 35 mL/min — ABNORMAL LOW (ref >60)
Glucose, Bld: 92 mg/dL (ref 70–99)
Potassium: 4.4 mmol/L (ref 3.5–5.1)
Sodium: 137 mmol/L (ref 135–145)
Total Bilirubin: 0.4 mg/dL (ref 0.3–1.2)
Total Protein: 6.9 g/dL (ref 6.5–8.1)

## 2023-10-21 LAB — FERRITIN: Ferritin: 103 ng/mL (ref 11–307)

## 2023-10-21 LAB — IRON AND TIBC
Iron: 44 ug/dL (ref 28–170)
Saturation Ratios: 14 % (ref 10.4–31.8)
TIBC: 309 ug/dL (ref 250–450)
UIBC: 265 ug/dL

## 2023-10-21 LAB — VITAMIN B12: Vitamin B-12: 835 pg/mL (ref 180–914)

## 2023-10-22 ENCOUNTER — Other Ambulatory Visit: Payer: Self-pay | Admitting: Gastroenterology

## 2023-10-22 ENCOUNTER — Telehealth: Payer: Self-pay | Admitting: *Deleted

## 2023-10-22 DIAGNOSIS — K625 Hemorrhage of anus and rectum: Secondary | ICD-10-CM

## 2023-10-22 DIAGNOSIS — K5900 Constipation, unspecified: Secondary | ICD-10-CM

## 2023-10-22 MED ORDER — LUBIPROSTONE 24 MCG PO CAPS: 24.0000 ug | ORAL_CAPSULE | Freq: Two times a day (BID) | ORAL | 3 refills | Status: DC

## 2023-10-22 MED ORDER — HYDROCORTISONE (PERIANAL) 2.5 % EX CREA: TOPICAL_CREAM | Freq: Two times a day (BID) | CUTANEOUS | 1 refills | Status: DC

## 2023-10-22 NOTE — Telephone Encounter (Signed)
Pt called and states she needs a refill on Amitiza and hydrocortisone cream sent to [facility] in [place]. Pt last OV 06/18/2023

## 2023-10-27 NOTE — Progress Notes (Unsigned)
[facility] [address], [place] [address]   CLINIC:  Medical Oncology/Hematology  PCP:  Doreatha Massed, MD [address] [place] [address] [phone]   REASON FOR VISIT:  Follow-up for normocytic anemia  CURRENT THERAPY: IV iron and Retacrit  INTERVAL HISTORY:   [name] [name] 77 y.o. 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  She was last seen by NP Durenda Hurt on 07/30/2023.  At today's visit, she reports feeling fair.  No recent hospitalizations, surgeries, or changes in baseline health status.  She felt improved energy after her IV iron in August 2024.  She has been feeling a bit more fatigued for the past few weeks.  She has recurrent ice pica. Her chronic dyspnea on exertion is no worse than usual.  She reports some mild right-sided chest pain intermittently for the past 2 weeks, appears to be brought on by moving her arm in a certain way.  (No chest pain today.  Patient instructed to discuss this with PCP and educated on alarm symptoms that would prompt immediate ED visit.)  She denies any exertional chest pain, lightheadedness, or passing out.  She denies any recent rectal bleeding or melena.  She reports some intermittent rectal bleeding for the past week.  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  She remains on octreotide as well as Eliquis (atrial fibrillation).  She has 60% energy and 80% appetite. She endorses that she is maintaining a stable weight.  ASSESSMENT & PLAN:  1.  Iron deficiency anemia - Seen at the request of Dr. Wolfgang Phoenix for evaluation and treatment of anemia in the setting of CKD stage IV and chronic iron deficiency from GI bleeding - She has had anemia since September 2021. 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 - She takes Protonix and self administers SQ octreotide twice daily.  She is on Eliquis for atrial fibrillation (cleared by GI). 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 - Hospitalized from 06/21/2022 through 06/23/2022 for symptomatic anemia.  Received 1 unit PRBC and IV Feraheme x500 mg during hospitalization. - Hematology work-up (07/02/2022): Mildly elevated reticulocytes 3.7%.  Normal LDH 140. CBC with Hgb at 9.7/MCV 92.2. Normal folate, copper, B12, MMA, homocystine. Negative SPEP and immunofixation.  Mildly elevated free light chain ratio in keeping with CKD stage IV. - She has intermittent bright red blood per rectum and melena, but this has resolved for the time being ever since small bowel endoscopy at [facility] in October 2023 - Failed to improve on oral iron supplementation - Epogen/Retacrit protocol initiated by nephrology in February 2022, intermittently requiring Retacrit injections. - Most recent Venofer 300 mg x3 in August 2024 -- She is taking vitamin B12 supplement 1,000 mcg daily - Labs (10/21/2023): Hgb 11.0, ferritin 103, iron saturation 14%.  Baseline CKD stage IIIb/IV with creatinine 1.54/GFR 35.  Normal B12 835, MMA pending.   - DIFFERENTIAL DIAGNOSIS: Since anemia resolved after IV iron repletion, suspect that her iron deficiency that iron deficiency is the main driving force in her anemia with some small impact due to her advanced CKD. - PLAN: Recommend IV Venofer 300 mg x 3.   - Continue CBC + possible Retacrit injections every 6  weeks. - Continue GI follow-up - Repeat CBC, CMP, and iron panel with RTC in 3 months with same-day visit -- Repeat B12/MMA in 6 months (May 2025)   2.  Other history - PMH: CKD stage IV, secondary hyperparathyroidism, chronic diastolic CHF, atrial fibrillation (Eliquis), hypertension, GERD, peripheral  arterial disease, mild COPD, and iron deficiency from GI bleeds  - SOCIAL: She is retired and lives at home alone.  She is able to ambulate short distances without difficulty and is independent with ADLs.  She is a former smoker, smokes 1 PPD for 25 years, quit in 1998.  She denies any alcohol or illicit drug use. - FAMILY: Family history positive for mother with unspecified anemia.  Her sister had breast cancer.  Maternal grandmother also had breast cancer.  PLAN SUMMARY: >> IV Venofer 300 mg x 3 >> CBC + Possible Retacrit every 6 weeks >> Same day labs (CBC/D, CMP, ferritin, iron/TIBC) + OFFICE visit + injection in 3 months     REVIEW OF SYSTEMS:  Review of Systems  Constitutional:  Positive for fatigue. Negative for appetite change, chills, diaphoresis, fever and unexpected weight change.  HENT:   Negative for lump/mass and nosebleeds.   Eyes:  Negative for eye problems.  Respiratory:  Positive for shortness of breath (with exertion). Negative for cough and hemoptysis.   Cardiovascular:  Positive for chest pain (at times, none today). Negative for leg swelling and palpitations.  Gastrointestinal:  Positive for abdominal pain (at times). Negative for blood in stool, constipation, diarrhea, nausea and vomiting.  Genitourinary:  Negative for hematuria.   Skin: Negative.   Neurological:  Positive for numbness. Negative for dizziness, headaches and light-headedness.  Hematological:  Does not bruise/bleed easily.     PHYSICAL EXAM:  ECOG PERFORMANCE STATUS: 2 - Symptomatic, <50% confined to bed  Vitals:   10/28/23 0821  BP: (!) 152/60  Pulse: (!) 58  Resp: 17  Temp: 98.2 F (36.8 C)  SpO2: 100%   Filed Weights   10/28/23 0821  Weight: 179 lb (81.2 kg)   Physical Exam Constitutional:      Appearance: Normal appearance. She is obese.  Cardiovascular:     Heart sounds: Normal heart sounds.  Pulmonary:     Breath sounds: Normal breath sounds.  Neurological:     General: No  focal deficit present.     Mental Status: Mental status is at baseline.  Psychiatric:        Behavior: Behavior normal. Behavior is cooperative.     PAST MEDICAL/SURGICAL HISTORY:  Past Medical History:  Diagnosis Date   Anemia    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HCC) 09/11/2022   Chronic kidney disease    Constipation 07/17/2017   GERD (gastroesophageal reflux disease)    Gout    Grade II diastolic dysfunction 11/23/2020   Hyperlipidemia    Hypertension    Hypokalemia    OSA (obstructive sleep apnea) 08/09/2020   PAF (paroxysmal atrial fibrillation) (HCC)    PAF (paroxysmal atrial fibrillation) (HCC)    PUD (peptic ulcer disease)    Syncope    Neurocardiogenic   Past Surgical History:  Procedure Laterality Date   ABDOMINAL AORTOGRAM W/LOWER EXTREMITY Right 12/13/2020   Procedure: ABDOMINAL AORTOGRAM W/LOWER EXTREMITY;  Surgeon: Nada Libman, MD;  Location: [facility] INVASIVE CV LAB;  Service: Cardiovascular;  Laterality: Right;   ABDOMINAL HYSTERECTOMY     AMPUTATION Right 12/14/2020   Procedure: Right fifth toe amputation;  Surgeon: Larina Earthly, MD;  Location: [facility] OR;  Service: Vascular;  Laterality: Right;   CATARACT EXTRACTION Bilateral    COLONOSCOPY N/A 10/07/2014   Dr. Darrick Penna: redundant left colon, moderate sized external hemorrhoids    COLONOSCOPY N/A 03/11/2020   Surgeon: West Bali, MD;  9 polyps, majority of which were tubular adenomas, nodular mucosa in the anus s/p biopsy which was benign.   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    HOT HEMOSTASIS  11/27/2020   Procedure: HOT HEMOSTASIS (ARGON PLASMA COAGULATION/BICAP);  Surgeon: Marguerita Merles, Reuel Boom, MD;  Location: [facility]O [facility];  Service: Gastroenterology;;   LIPOMA RESECTION     PERIPHERAL VASCULAR INTERVENTION Right 12/13/2020   Procedure: PERIPHERAL VASCULAR INTERVENTION;  Surgeon: Nada Libman, MD;  Location: [facility] INVAS[facility] CV LAB;  Service: Cardiovascular;  Laterality: Right;  SFA/PT/AT   POLYPECTOMY  03/11/2020   Procedure: POLYPECTOMY;  Surgeon: West Bali, MD;  Location: [facility];  Service: Endoscopy;;  cecal, transverse, descending, sigmoid   TEE WITHOUT CARDIOVERSION N/A 05/10/2021   Procedure: TRANSESOPHAGEAL ECHOCARDIOGRAM (TEE) WATCHMEN EVALUATION;  Surgeon: Wendall Stade, MD;  Location: [facility]COPY;  Service: Cardiovascular;  Laterality: N/A;    SOCIAL HISTORY:  Social History   Socioeconomic History   Marital status: Single    Spouse name: Not on file   Number of children: Not on file   Years of education: Not on file   Highest education level: Not on file  Occupational History   Occupation: Retired  Tobacco Use   Smoking status: Former    Current packs/day: 0.00    Average packs/day: 1 pack/day for 33.6 years (33.6 ttl pk-yrs)    Types: Cigarettes    Start date: 03/16/1964    Quit date: 10/07/1997    Years since quitting: 26.0   Smokeless tobacco: Never  Vaping Use   Vaping status: Never Used  Substance and Sexual Activity   Alcohol use: No    Alcohol/week: 0.0 standard drinks of alcohol   Drug use: No   Sexual activity: Yes    Partners: Male  Other Topics Concern   Not on file  Social History Narrative   Not on file   Social Determinants of Health   Financial Resource Strain: Not on file  Food Insecurity: Not on file  Transportation Needs: Not on file  Physical Activity: Not on file  Stress: Not on file  Social Connections: Not on file  Intimate Partner Violence: Not on file     FAMILY HISTORY:  Family History  Problem Relation Age of Onset   Stroke Mother    Dementia Father    Cancer - Other Sister    Atrial fibrillation Sister    Heart failure Sister    Colon cancer Maternal Grandmother        diagnosed in her 53s    CURRENT MEDICATIONS:  Outpatient Encounter Medications as of 10/28/2023  Medication Sig Note   acetaminophen (TYLENOL) 500 MG tablet Take 1,000 mg by mouth every 6 (six) hours as needed for moderate pain.    albuterol (VENTOLIN HFA) 108 (90  Base) MCG/ACT inhaler Inhale 2 puffs into the lungs every 6 (six) hours as needed for wheezing or shortness of breath.    apixaban (ELIQUIS) 5 MG TABS tablet Take 1 tablet by mouth 2 (two) times daily.    calcitRIOL (ROCALTROL) 0.25 MCG capsule Take 0.25 mcg by mouth 3 (three) times a week. Monday,Wednesday and Fridays    Cholecalciferol (VITAMIN D-3) 25 MCG (1000 UT) CAPS Take 1,000 Units by mouth daily.    Colchicine 0.6 MG CAPS Take by mouth as needed. 07/09/2023: PRN   cyclobenzaprine (FLEXERIL) 5 MG tablet Take 5 mg by mouth 3 (three) times daily. 06/18/2023: As needed per patient.    diclofenac Sodium (VOLTAREN) 1 % GEL Apply 1 application topically 4 (four) times daily as needed (pain).    flecainide (TAMBOCOR) 50 MG tablet TAKE 1 TABLET BY MOUTH TWICE A DAY    furosemide (LASIX) 20 MG tablet Take 20 mg by mouth daily. In the afternoon In addition to her 40 mg once per day.    furosemide (LASIX) 40 MG tablet TAKE 1 TABLET BY MOUTH EVERY DAY (Patient taking differently: Take 40 mg by mouth daily. In addition to 20 mg in the afternoon.)    gabapentin (NEURONTIN) 100 MG capsule Take 1 capsule (100 mg total) by mouth 3 (three) times daily. 06/18/2023: prn   hydrocortisone (ANUSOL-HC) 2.5 % rectal cream Place rectally 2 (two) times daily.    losartan (COZAAR) 50 MG tablet TAKE 1 TABLET BY MOUTH EVERY DAY    lubiprostone (AMITIZA) 24 MCG capsule Take 1 capsule (24 mcg total) by mouth 2 (two) times daily with  a meal.    octreotide (SANDOSTATIN) 100 MCG/ML SOLN injection INJECT 1 ML (100 MCG TOTAL) UNDER THE SKIN EVERY 12 (TWELVE) HOURS.    ondansetron (ZOFRAN) 4 MG tablet Take 1 tablet (4 mg total) by mouth every 6 (six) hours as needed for nausea.    pantoprazole (PROTONIX) 40 MG tablet Take 1 tablet (40 mg total) by mouth daily.    polyethylene glycol (MIRALAX / GLYCOLAX) 17 g packet Take 17 g by mouth daily as needed for mild constipation.    potassium chloride SA (KLOR-CON) 20 MEQ tablet Take 20 mEq by mouth in the morning, at noon, and at bedtime.    predniSONE (DELTASONE) 10 MG tablet Take 1 tablet (10 mg total) by mouth 3 (three) times daily. 06/18/2023: As needed.    rosuvastatin (CRESTOR) 10 MG tablet TAKE 1 TABLET BY MOUTH EVERY DAY    tiZANidine (ZANAFLEX) 4 MG tablet Take 1 tablet (4 mg total) by mouth every 6 (six) hours as needed for muscle spasms. 07/09/2023: AS NEEDED   vitamin C (ASCORBIC ACID) 250 MG tablet Take 250 mg by mouth daily.    No facility-administered encounter medications on file as of 10/28/2023.    ALLERGIES:  Allergies  Allergen Reactions   Acetaminophen-Codeine Hives, Itching and Rash   Amlodipine     Felt she retained fluid in her chest    Amoxicillin Hives    Did it involve swelling of the face/tongue/throat, SOB, or low BP? No Did it involve sudden or severe rash/hives, skin peeling, or any reaction on the inside of your mouth or nose? No Did you need to seek medical attention at a hospital or doctor's office? No When did it last happen?      10 + years If all above answers are "NO", may proceed with cephalosporin use.    Doxycycline     "  sick and weak"   Allopurinol Hives and Rash   Tramadol Hives and Rash    LABORATORY DATA:  I have reviewed the labs as listed.  CBC    Component Value Date/Time   WBC 6.2 10/21/2023 1034   RBC 3.80 (L) 10/21/2023 1034   HGB 11.0 (L) 10/21/2023 1034   HCT 35.9 (L) 10/21/2023 1034   PLT 274 10/21/2023 1034   MCV  94.5 10/21/2023 1034   MCH 28.9 10/21/2023 1034   MCHC 30.6 10/21/2023 1034   RDW 14.6 10/21/2023 1034   LYMPHSABS 2.5 10/21/2023 1034   MONOABS 0.6 10/21/2023 1034   EOSABS 0.2 10/21/2023 1034   BASOSABS 0.0 10/21/2023 1034      Latest Ref Rng & Units 10/21/2023   10:34 AM 09/13/2023    8:10 AM 07/02/2023    7:57 AM  CMP  Glucose 70 - 99 mg/dL 92  440  102   BUN 8 - 23 mg/dL 22  27  20    Creatinine 0.44 - 1.00 mg/dL 7.25  3.66  4.40   Sodium 135 - 145 mmol/L 137  136  137   Potassium 3.5 - 5.1 mmol/L 4.4  4.1  4.1   Chloride 98 - 111 mmol/L 99  102  100   CO2 22 - 32 mmol/L 31  28  29    Calcium 8.9 - 10.3 mg/dL 8.3  8.7  8.7   Total Protein 6.5 - 8.1 g/dL 6.9  7.1  7.0   Total Bilirubin 0.3 - 1.2 mg/dL 0.4  0.5  0.6   Alkaline Phos 38 - 126 U/L 74  78  75   AST 15 - 41 U/L 33  19  21   ALT 0 - 44 U/L 21  10  13      DIAGNOSTIC IMAGING:  I have independently reviewed the relevant imaging and discussed with the patient.   WRAP UP:  All questions were answered. The patient knows to call the clinic with any problems, questions or concerns.  Medical decision making: Low  Time spent on visit: I spent 15 minutes counseling the patient face to face. The total time spent in the appointment was 22 minutes and more than 50% was on counseling.  Carnella Guadalajara, PA-C  10/28/23 9:33 AM

## 2023-10-28 ENCOUNTER — Inpatient Hospital Stay: Payer: Medicare HMO | Admitting: Physician Assistant

## 2023-10-28 ENCOUNTER — Inpatient Hospital Stay: Payer: Medicare HMO | Attending: Hematology

## 2023-10-28 VITALS — BP 152/60 | HR 58 | Temp 98.2°F | Resp 17 | Ht 62.0 in | Wt 179.0 lb

## 2023-10-28 DIAGNOSIS — F5089 Other specified eating disorder: Secondary | ICD-10-CM | POA: Diagnosis not present

## 2023-10-28 DIAGNOSIS — I480 Paroxysmal atrial fibrillation: Secondary | ICD-10-CM | POA: Diagnosis not present

## 2023-10-28 DIAGNOSIS — I5032 Chronic diastolic (congestive) heart failure: Secondary | ICD-10-CM | POA: Diagnosis not present

## 2023-10-28 DIAGNOSIS — Z8711 Personal history of peptic ulcer disease: Secondary | ICD-10-CM | POA: Diagnosis not present

## 2023-10-28 DIAGNOSIS — D509 Iron deficiency anemia, unspecified: Secondary | ICD-10-CM | POA: Insufficient documentation

## 2023-10-28 DIAGNOSIS — N184 Chronic kidney disease, stage 4 (severe): Secondary | ICD-10-CM

## 2023-10-28 DIAGNOSIS — Z79899 Other long term (current) drug therapy: Secondary | ICD-10-CM | POA: Diagnosis not present

## 2023-10-28 DIAGNOSIS — J449 Chronic obstructive pulmonary disease, unspecified: Secondary | ICD-10-CM | POA: Diagnosis not present

## 2023-10-28 DIAGNOSIS — N2581 Secondary hyperparathyroidism of renal origin: Secondary | ICD-10-CM | POA: Diagnosis not present

## 2023-10-28 DIAGNOSIS — E785 Hyperlipidemia, unspecified: Secondary | ICD-10-CM | POA: Diagnosis not present

## 2023-10-28 DIAGNOSIS — K920 Hematemesis: Secondary | ICD-10-CM | POA: Diagnosis not present

## 2023-10-28 DIAGNOSIS — K219 Gastro-esophageal reflux disease without esophagitis: Secondary | ICD-10-CM | POA: Insufficient documentation

## 2023-10-28 DIAGNOSIS — D500 Iron deficiency anemia secondary to blood loss (chronic): Secondary | ICD-10-CM

## 2023-10-28 DIAGNOSIS — G4733 Obstructive sleep apnea (adult) (pediatric): Secondary | ICD-10-CM | POA: Diagnosis not present

## 2023-10-28 DIAGNOSIS — D631 Anemia in chronic kidney disease: Secondary | ICD-10-CM

## 2023-10-28 DIAGNOSIS — Z87891 Personal history of nicotine dependence: Secondary | ICD-10-CM | POA: Diagnosis not present

## 2023-10-28 DIAGNOSIS — I130 Hypertensive heart and chronic kidney disease with heart failure and stage 1 through stage 4 chronic kidney disease, or unspecified chronic kidney disease: Secondary | ICD-10-CM | POA: Diagnosis not present

## 2023-10-28 DIAGNOSIS — Z803 Family history of malignant neoplasm of breast: Secondary | ICD-10-CM | POA: Diagnosis not present

## 2023-10-28 DIAGNOSIS — Z800 Family history of malignant neoplasm of digestive organs: Secondary | ICD-10-CM | POA: Insufficient documentation

## 2023-10-28 LAB — METHYLMALONIC ACID, SERUM: Methylmalonic Acid, Quantitative: 255 nmol/L (ref 0–378)

## 2023-10-28 NOTE — Progress Notes (Signed)
Hemoglobin today is 11.0.  Hold Retacrit today per orders by Rojelio Brenner, PA-C.

## 2023-10-28 NOTE — Patient Instructions (Signed)
[facility] at [facility] Discharge Instructions  You were seen today by Rojelio Brenner PA-C for your anemia.  Your anemia is due to your chronic kidney disease and iron deficiency from your history of GI bleeding.  Your iron levels are lower than normal.  We will schedule you for IV iron x 3 doses.  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  We will recheck labs and see you for office visit in 3 months.  Continue to follow-up with Dr. Wolfgang Phoenix for your chronic kidney disease, and Dr. Levon Hedger & [facility] for your chronic GI bleeding.  ** Thank you for trusting me with your healthcare!  I strive to provide all of my patients with quality care at each visit.  If you receive a survey for this visit, I would be so grateful to you for taking the time to provide feedback.  Thank you in advance!  ~ Maverick Patman                   Dr. Doreatha Massed   &   Rojelio Brenner, PA-C   - - - - - - - - - - - - - - - - - -     Thank you for choosing [facility] at [facility] to provide your oncology and hematology care.  To afford each patient quality time with our provider, please arrive at least 15 minutes before your scheduled appointment time.   If you have a lab appointment with the Cancer Center please come in thru the Main Entrance and check in at the main information desk.  You need to re-schedule your appointment should you arrive 10 or more minutes late.  We strive to give you quality time with our providers, and arriving late affects you and other patients whose appointments are after yours.  Also, if you no show three or more times for appointments you may be dismissed from the clinic at the providers discretion.     Again, thank you for choosing [facility].  Our hope is that these requests will decrease the amount of time  that you wait before being seen by our physicians.       _____________________________________________________________  Should you have questions after your visit to [facility], please contact our office at [phone] and follow the prompts.  Our office hours are 8:00 a.m. and 4:30 p.m. Monday - Friday.  Please note that voicemails left after 4:00 p.m. may not be returned until the following business day.  We are closed weekends and major holidays.  You do have access to a nurse 24-7, just call the main number to the clinic [phone] and do not press any options, hold on the line and a nurse will answer the phone.    For prescription refill requests, have your pharmacy contact our office and allow 72 hours.    Due to Covid, you will need to wear a mask upon entering the hospital. If you do not have a mask, a mask will be given to you at the Main Entrance upon arrival. For doctor visits, patients may have 1 support person age 51 or older with them. For treatment visits, patients can not have anyone with them due to social distancing guidelines and our immunocompromised population.

## 2023-10-30 ENCOUNTER — Inpatient Hospital Stay: Payer: Medicare HMO

## 2023-11-06 ENCOUNTER — Inpatient Hospital Stay: Payer: Medicare HMO

## 2023-11-06 VITALS — BP 146/58 | HR 59 | Temp 97.3°F | Resp 18

## 2023-11-06 DIAGNOSIS — D509 Iron deficiency anemia, unspecified: Secondary | ICD-10-CM | POA: Diagnosis not present

## 2023-11-06 DIAGNOSIS — D631 Anemia in chronic kidney disease: Secondary | ICD-10-CM

## 2023-11-06 DIAGNOSIS — D500 Iron deficiency anemia secondary to blood loss (chronic): Secondary | ICD-10-CM

## 2023-11-06 MED ORDER — SODIUM CHLORIDE 0.9 % IV SOLN: INTRAVENOUS | Status: DC

## 2023-11-06 MED ORDER — SODIUM CHLORIDE 0.9 % IV SOLN
300.0000 mg | Freq: Once | INTRAVENOUS | Status: AC
  Administered 2023-11-06: 300 mg via INTRAVENOUS
  Filled 2023-11-06: qty 300

## 2023-11-06 NOTE — Patient Instructions (Signed)

## 2023-11-06 NOTE — Progress Notes (Signed)
Patient took own premed from home.   Patient tolerated iron infusion with no complaints voiced.  Peripheral IV site clean and dry with good blood return noted before and after infusion.  Band aid applied.  VSS with discharge and left in satisfactory condition with no s/s of distress noted.

## 2023-11-14 ENCOUNTER — Inpatient Hospital Stay: Payer: Medicare HMO

## 2023-11-14 VITALS — BP 151/55 | HR 54 | Temp 97.0°F | Resp 18

## 2023-11-14 DIAGNOSIS — D631 Anemia in chronic kidney disease: Secondary | ICD-10-CM

## 2023-11-14 DIAGNOSIS — D500 Iron deficiency anemia secondary to blood loss (chronic): Secondary | ICD-10-CM

## 2023-11-14 DIAGNOSIS — D649 Anemia, unspecified: Secondary | ICD-10-CM

## 2023-11-14 DIAGNOSIS — D509 Iron deficiency anemia, unspecified: Secondary | ICD-10-CM | POA: Diagnosis not present

## 2023-11-14 LAB — CBC
HCT: 36 % (ref 36.0–46.0)
Hemoglobin: 11 g/dL — ABNORMAL LOW (ref 12.0–15.0)
MCH: 29.2 pg (ref 26.0–34.0)
MCHC: 30.6 g/dL (ref 30.0–36.0)
MCV: 95.5 fL (ref 80.0–100.0)
Platelets: 277 10*3/uL (ref 150–400)
RBC: 3.77 MIL/uL — ABNORMAL LOW (ref 3.87–5.11)
RDW: 14.9 % (ref 11.5–15.5)
WBC: 5.7 10*3/uL (ref 4.0–10.5)
nRBC: 0 % (ref 0.0–0.2)

## 2023-11-14 MED ORDER — SODIUM CHLORIDE 0.9 % IV SOLN
300.0000 mg | Freq: Once | INTRAVENOUS | Status: AC
  Administered 2023-11-14: 300 mg via INTRAVENOUS
  Filled 2023-11-14: qty 10

## 2023-11-14 MED ORDER — SODIUM CHLORIDE 0.9 % IV SOLN
INTRAVENOUS | Status: AC
Start: 2023-11-14 — End: 2023-11-15

## 2023-11-14 NOTE — Progress Notes (Signed)
Patient reports taking pre meds at home. Patient tolerated iron infusion with no complaints voiced. Peripheral IV site clean and dry with good blood return noted before and after infusion. Band aid applied. VSS with discharge and left in satisfactory condition with no s/s of distress noted.

## 2023-11-19 ENCOUNTER — Encounter: Payer: Self-pay | Admitting: Hematology

## 2023-11-19 NOTE — Patient Instructions (Signed)
[facility] - A DEPT OF [facility]  Discharge Instructions: Thank you for choosing [facility] to provide your oncology and hematology care.  If you have a lab appointment with the Cancer Center - please note that after April 8th, 2024, all labs will be drawn in the cancer center.  You do not have to check in or register with the main entrance as you have in the past but will complete your check-in in the cancer center.  Wear comfortable clothing and clothing appropriate for easy access to any Portacath or PICC line.   We strive to give you quality time with your provider. You may need to reschedule your appointment if you arrive late (15 or more minutes).  Arriving late affects you and other patients whose appointments are after yours.  Also, if you miss three or more appointments without notifying the office, you may be dismissed from the clinic at the provider's discretion.      For prescription refill requests, have your pharmacy contact our office and allow 72 hours for refills to be completed.    Today you received the following Venofer, return as scheduled.   To help prevent nausea and vomiting after your treatment, we encourage you to take your nausea medication as directed.  BELOW ARE SYMPTOMS THAT SHOULD BE REPORTED IMMEDIATELY: *FEVER GREATER THAN 100.4 F (38 C) OR HIGHER *CHILLS OR SWEATING *NAUSEA AND VOMITING THAT IS NOT CONTROLLED WITH YOUR NAUSEA MEDICATION *UNUSUAL SHORTNESS OF BREATH *UNUSUAL BRUISING OR BLEEDING *URINARY PROBLEMS (pain or burning when urinating, or frequent urination) *BOWEL PROBLEMS (unusual diarrhea, constipation, pain near the anus) TENDERNESS IN MOUTH AND THROAT WITH OR WITHOUT PRESENCE OF ULCERS (sore throat, sores in mouth, or a toothache) UNUSUAL RASH, SWELLING OR PAIN  UNUSUAL VAGINAL DISCHARGE OR ITCHING   Items with * indicate a potential emergency and should be followed up as soon as possible or  go to the Emergency Department if any problems should occur.  Please show the CHEMOTHERAPY ALERT CARD or IMMUNOTHERAPY ALERT CARD at check-in to the Emergency Department and triage nurse.  Should you have questions after your visit or need to cancel or reschedule your appointment, please contact [facility] - A DEPT OF [facility] [phone]  and follow the prompts.  Office hours are 8:00 a.m. to 4:30 p.m. Monday - Friday. Please note that voicemails left after 4:00 p.m. may not be returned until the following business day.  We are closed weekends and major holidays. You have access to a nurse at all times for urgent questions. Please call the main number to the clinic [phone] and follow the prompts.  For any non-urgent questions, you may also contact your provider using MyChart. We now offer e-Visits for anyone 43 and older to request care online for non-urgent symptoms. For details visit [email or web address].   Also download the MyChart app! Go to the app store, search "MyChart", open the app, select [facility], and log in with your MyChart username and password.

## 2023-11-28 ENCOUNTER — Inpatient Hospital Stay: Payer: Medicare HMO | Attending: Hematology

## 2023-11-28 VITALS — BP 139/56 | HR 66 | Temp 97.3°F | Resp 18 | Ht 62.0 in | Wt 177.3 lb

## 2023-11-28 DIAGNOSIS — K922 Gastrointestinal hemorrhage, unspecified: Secondary | ICD-10-CM | POA: Diagnosis not present

## 2023-11-28 DIAGNOSIS — D500 Iron deficiency anemia secondary to blood loss (chronic): Secondary | ICD-10-CM | POA: Diagnosis present

## 2023-11-28 DIAGNOSIS — N184 Chronic kidney disease, stage 4 (severe): Secondary | ICD-10-CM | POA: Diagnosis not present

## 2023-11-28 DIAGNOSIS — D631 Anemia in chronic kidney disease: Secondary | ICD-10-CM | POA: Insufficient documentation

## 2023-11-28 MED ORDER — SODIUM CHLORIDE 0.9 % IV SOLN
300.0000 mg | Freq: Once | INTRAVENOUS | Status: AC
  Administered 2023-11-28: 300 mg via INTRAVENOUS
  Filled 2023-11-28: qty 15

## 2023-11-28 MED ORDER — ACETAMINOPHEN 325 MG PO TABS: 650.0000 mg | ORAL_TABLET | Freq: Once | ORAL | Status: DC

## 2023-11-28 MED ORDER — SODIUM CHLORIDE 0.9 % IV SOLN: INTRAVENOUS | Status: DC

## 2023-11-28 MED ORDER — CETIRIZINE HCL 10 MG PO TABS: 10.0000 mg | ORAL_TABLET | Freq: Once | ORAL | Status: DC

## 2023-11-28 NOTE — Patient Instructions (Signed)
[facility] [place] - A DEPT OF [facility]  Discharge Instructions: Thank you for choosing [facility] to provide your oncology and hematology care.  If you have a lab appointment with the Cancer Center - please note that after April 8th, 2024, all labs will be drawn in the cancer center.  You do not have to check in or register with the main entrance as you have in the past but will complete your check-in in the cancer center.  Wear comfortable clothing and clothing appropriate for easy access to any Portacath or PICC line.   We strive to give you quality time with your provider. You may need to reschedule your appointment if you arrive late (15 or more minutes).  Arriving late affects you and other patients whose appointments are after yours.  Also, if you miss three or more appointments without notifying the office, you may be dismissed from the clinic at the provider's discretion.      For prescription refill requests, have your pharmacy contact our office and allow 72 hours for refills to be completed.    Today you received the following chemotherapy and/or immunotherapy agents Venofer      To help prevent nausea and vomiting after your treatment, we encourage you to take your nausea medication as directed.  BELOW ARE SYMPTOMS THAT SHOULD BE REPORTED IMMEDIATELY: *FEVER GREATER THAN 100.4 F (38 C) OR HIGHER *CHILLS OR SWEATING *NAUSEA AND VOMITING THAT IS NOT CONTROLLED WITH YOUR NAUSEA MEDICATION *UNUSUAL SHORTNESS OF BREATH *UNUSUAL BRUISING OR BLEEDING *URINARY PROBLEMS (pain or burning when urinating, or frequent urination) *BOWEL PROBLEMS (unusual diarrhea, constipation, pain near the anus) TENDERNESS IN MOUTH AND THROAT WITH OR WITHOUT PRESENCE OF ULCERS (sore throat, sores in mouth, or a toothache) UNUSUAL RASH, SWELLING OR PAIN  UNUSUAL VAGINAL DISCHARGE OR ITCHING   Items with * indicate a potential emergency and should be followed up  as soon as possible or go to the Emergency Department if any problems should occur.  Please show the CHEMOTHERAPY ALERT CARD or IMMUNOTHERAPY ALERT CARD at check-in to the Emergency Department and triage nurse.  Should you have questions after your visit or need to cancel or reschedule your appointment, please contact [facility] [place] - A DEPT OF Eligha Bridegroom [facility] [phone]  and follow the prompts.  Office hours are 8:00 a.m. to 4:30 p.m. Monday - Friday. Please note that voicemails left after 4:00 p.m. may not be returned until the following business day.  We are closed weekends and major holidays. You have access to a nurse at all times for urgent questions. Please call the main number to the clinic [phone] and follow the prompts.  For any non-urgent questions, you may also contact your provider using MyChart. We now offer e-Visits for anyone 63 and older to request care online for non-urgent symptoms. For details visit [email or web address].   Also download the MyChart app! Go to the app store, search "MyChart", open the app, select [employer], and log in with your MyChart username and password.

## 2023-11-28 NOTE — Progress Notes (Signed)
Patient presents today for Venofer infusion per providers order.  Vital signs WNL.  Patient has no new complaints at this time.  Patient took premedications prior to appointment at  home.  Peripheral IV started and blood return noted pre and post infusion.    Venofer infusion without adverse affects.  Vital signs stable.  No complaints at this time.  Discharge from clinic ambulatory in stable condition.  Alert and oriented X 3.  Follow up with [facility] as scheduled.

## 2023-12-09 ENCOUNTER — Inpatient Hospital Stay: Payer: Medicare HMO

## 2023-12-09 DIAGNOSIS — D500 Iron deficiency anemia secondary to blood loss (chronic): Secondary | ICD-10-CM | POA: Diagnosis not present

## 2023-12-09 DIAGNOSIS — D631 Anemia in chronic kidney disease: Secondary | ICD-10-CM

## 2023-12-09 DIAGNOSIS — D649 Anemia, unspecified: Secondary | ICD-10-CM

## 2023-12-09 LAB — CBC
HCT: 39.3 % (ref 36.0–46.0)
Hemoglobin: 11.4 g/dL — ABNORMAL LOW (ref 12.0–15.0)
MCH: 27.9 pg (ref 26.0–34.0)
MCHC: 29 g/dL — ABNORMAL LOW (ref 30.0–36.0)
MCV: 96.1 fL (ref 80.0–100.0)
Platelets: 297 10*3/uL (ref 150–400)
RBC: 4.09 MIL/uL (ref 3.87–5.11)
RDW: 14.3 % (ref 11.5–15.5)
WBC: 4.9 10*3/uL (ref 4.0–10.5)
nRBC: 0 % (ref 0.0–0.2)

## 2023-12-09 NOTE — Progress Notes (Signed)
Hemoglobin 11.4. Hold Retacrit per MD orders. Pt made aware.   Mailee Klaas [employer]

## 2023-12-31 ENCOUNTER — Telehealth: Payer: Self-pay | Admitting: Internal Medicine

## 2023-12-31 NOTE — Telephone Encounter (Signed)
 Spoke with pt who states that her nephrologist prescribed an antibiotic and steroid for her respiratory infection. Pt will reach out to her PCP for a medication change do to having palpitations. Please advise.

## 2023-12-31 NOTE — Telephone Encounter (Signed)
 Patient says that she has a respiratory infection and was given prednisone and some antibiotics. Says that the antibiotics she was given makes her heart race and have afib. Would like for Dr. Ladona Ridgel to prescribe something for her

## 2024-01-01 NOTE — Telephone Encounter (Signed)
 Per Dr. Ladona Ridgel- may take 1 extra flecainide tablet daily for palpitations until she feels better

## 2024-01-01 NOTE — Telephone Encounter (Signed)
 I spoke with patient and she agrees to take an extra flecainide 50 mg daily until her symptoms resolve.

## 2024-01-15 ENCOUNTER — Other Ambulatory Visit: Payer: Self-pay | Admitting: Internal Medicine

## 2024-01-20 ENCOUNTER — Inpatient Hospital Stay: Payer: Medicare HMO

## 2024-01-20 ENCOUNTER — Inpatient Hospital Stay: Payer: Medicare HMO | Attending: Hematology

## 2024-01-20 VITALS — BP 156/64 | HR 66 | Temp 97.2°F | Resp 19

## 2024-01-20 DIAGNOSIS — D631 Anemia in chronic kidney disease: Secondary | ICD-10-CM | POA: Insufficient documentation

## 2024-01-20 DIAGNOSIS — D500 Iron deficiency anemia secondary to blood loss (chronic): Secondary | ICD-10-CM

## 2024-01-20 DIAGNOSIS — D509 Iron deficiency anemia, unspecified: Secondary | ICD-10-CM | POA: Diagnosis not present

## 2024-01-20 DIAGNOSIS — D649 Anemia, unspecified: Secondary | ICD-10-CM

## 2024-01-20 DIAGNOSIS — N184 Chronic kidney disease, stage 4 (severe): Secondary | ICD-10-CM | POA: Diagnosis present

## 2024-01-20 LAB — CBC
HCT: 30.2 % — ABNORMAL LOW (ref 36.0–46.0)
Hemoglobin: 9 g/dL — ABNORMAL LOW (ref 12.0–15.0)
MCH: 28.9 pg (ref 26.0–34.0)
MCHC: 29.8 g/dL — ABNORMAL LOW (ref 30.0–36.0)
MCV: 97.1 fL (ref 80.0–100.0)
Platelets: 249 10*3/uL (ref 150–400)
RBC: 3.11 MIL/uL — ABNORMAL LOW (ref 3.87–5.11)
RDW: 15 % (ref 11.5–15.5)
WBC: 6 10*3/uL (ref 4.0–10.5)
nRBC: 0 % (ref 0.0–0.2)

## 2024-01-20 MED ORDER — EPOETIN ALFA 10000 UNIT/ML IJ SOLN
10000.0000 [IU] | Freq: Once | INTRAMUSCULAR | Status: AC
Start: 2024-01-20 — End: 2024-01-20
  Administered 2024-01-20: 10000 [IU] via SUBCUTANEOUS
  Filled 2024-01-20: qty 1

## 2024-01-20 NOTE — Progress Notes (Signed)
Hemoglobin is 9.0 today, it was 11.0 one month ago, [employer] notified. Will recheck hemoglobin is 2 weeks to monitor per PA.   Patient tolerated it well without problems. Vitals stable and discharged home from clinic ambulatory. Follow up as scheduled.

## 2024-01-20 NOTE — Patient Instructions (Signed)
[facility] [place] [facility]  Discharge Instructions: Thank you for choosing [facility] to provide your oncology and hematology care.  If you have a lab appointment with the Cancer Center - please note that after April 8th, 2024, all labs will be drawn in the cancer center.  You do not have to check in or register with the main entrance as you have in the past but will complete your check-in in the cancer center.  Wear comfortable clothing and clothing appropriate for easy access to any Portacath or PICC line.   We strive to give you quality time with your provider. You may need to reschedule your appointment if you arrive late (15 or more minutes).  Arriving late affects you and other patients whose appointments are after yours.  Also, if you miss three or more appointments without notifying the office, you may be dismissed from the clinic at the provider's discretion.      For prescription refill requests, have your pharmacy contact our office and allow 72 hours for refills to be completed.    Today you received the following injection Retacrit   To help prevent nausea and vomiting after your treatment, we encourage you to take your nausea medication as directed.  BELOW ARE SYMPTOMS THAT SHOULD BE REPORTED IMMEDIATELY: *FEVER GREATER THAN 100.4 F (38 C) OR HIGHER *CHILLS OR SWEATING *NAUSEA AND VOMITING THAT IS NOT CONTROLLED WITH YOUR NAUSEA MEDICATION *UNUSUAL SHORTNESS OF BREATH *UNUSUAL BRUISING OR BLEEDING *URINARY PROBLEMS (pain or burning when urinating, or frequent urination) *BOWEL PROBLEMS (unusual diarrhea, constipation, pain near the anus) TENDERNESS IN MOUTH AND THROAT WITH OR WITHOUT PRESENCE OF ULCERS (sore throat, sores in mouth, or a toothache) UNUSUAL RASH, SWELLING OR PAIN  UNUSUAL VAGINAL DISCHARGE OR ITCHING   Items with * indicate a potential emergency and should be followed up as soon as possible or go to the  Emergency Department if any problems should occur.  Please show the CHEMOTHERAPY ALERT CARD or IMMUNOTHERAPY ALERT CARD at check-in to the Emergency Department and triage nurse.  Should you have questions after your visit or need to cancel or reschedule your appointment, please contact [facility] [place] - A DEPT OF [facility]igh[facility] Bridegroom [facility] [phone]  and follow the prompts.  Office hours are 8:00 a.m. to 4:30 p.m. Monday - Friday. Please note that voicemails left after 4:00 p.m. may not be returned until the following business day.  We are closed weekends and major holidays. You have access to a nurse at all times for urgent questions. Please call the main number to the clinic [phone] and follow the prompts.  For any non-urgent questions, you may also contact your provider using MyChart. We now offer e-Visits for anyone 61 and older to request care online for non-urgent symptoms. For details visit [email or web address].   Also download the MyChart app! Go to the app store, search "MyChart", open the app, select [place], and log in with your MyChart username and password.

## 2024-01-21 ENCOUNTER — Telehealth: Payer: Self-pay | Admitting: *Deleted

## 2024-01-21 NOTE — Telephone Encounter (Signed)
Pt called and states she received a letter from [facility] that she will be receiving a limited supply of Octreotide or they may decide not to cover medication at all. I informed her that we had not received that letter yet. But I would keep an eye out for it.

## 2024-01-22 NOTE — Telephone Encounter (Signed)
Noted

## 2024-02-03 ENCOUNTER — Inpatient Hospital Stay: Payer: Medicare HMO | Attending: Hematology

## 2024-02-03 DIAGNOSIS — D500 Iron deficiency anemia secondary to blood loss (chronic): Secondary | ICD-10-CM | POA: Insufficient documentation

## 2024-02-03 DIAGNOSIS — D631 Anemia in chronic kidney disease: Secondary | ICD-10-CM | POA: Diagnosis not present

## 2024-02-03 DIAGNOSIS — N184 Chronic kidney disease, stage 4 (severe): Secondary | ICD-10-CM | POA: Diagnosis not present

## 2024-02-03 LAB — COMPREHENSIVE METABOLIC PANEL
ALT: 13 U/L (ref 0–44)
AST: 22 U/L (ref 15–41)
Albumin: 3.8 g/dL (ref 3.5–5.0)
Alkaline Phosphatase: 56 U/L (ref 38–126)
Anion gap: 9 (ref 5–15)
BUN: 17 mg/dL (ref 8–23)
CO2: 27 mmol/L (ref 22–32)
Calcium: 8.9 mg/dL (ref 8.9–10.3)
Chloride: 101 mmol/L (ref 98–111)
Creatinine, Ser: 1.67 mg/dL — ABNORMAL HIGH (ref 0.44–1.00)
GFR, Estimated: 31 mL/min — ABNORMAL LOW (ref >60)
Glucose, Bld: 109 mg/dL — ABNORMAL HIGH (ref 70–99)
Potassium: 3.9 mmol/L (ref 3.5–5.1)
Sodium: 137 mmol/L (ref 135–145)
Total Bilirubin: 0.5 mg/dL (ref 0.0–1.2)
Total Protein: 6.9 g/dL (ref 6.5–8.1)

## 2024-02-03 LAB — CBC WITH DIFFERENTIAL/PLATELET
Abs Immature Granulocytes: 0.01 10*3/uL (ref 0.00–0.07)
Basophils Absolute: 0 10*3/uL (ref 0.0–0.1)
Basophils Relative: 1 %
Eosinophils Absolute: 0.1 10*3/uL (ref 0.0–0.5)
Eosinophils Relative: 3 %
HCT: 33.8 % — ABNORMAL LOW (ref 36.0–46.0)
Hemoglobin: 10 g/dL — ABNORMAL LOW (ref 12.0–15.0)
Immature Granulocytes: 0 %
Lymphocytes Relative: 39 %
Lymphs Abs: 1.9 10*3/uL (ref 0.7–4.0)
MCH: 28.2 pg (ref 26.0–34.0)
MCHC: 29.6 g/dL — ABNORMAL LOW (ref 30.0–36.0)
MCV: 95.2 fL (ref 80.0–100.0)
Monocytes Absolute: 0.6 10*3/uL (ref 0.1–1.0)
Monocytes Relative: 11 %
Neutro Abs: 2.3 10*3/uL (ref 1.7–7.7)
Neutrophils Relative %: 46 %
Platelets: 402 10*3/uL — ABNORMAL HIGH (ref 150–400)
RBC: 3.55 MIL/uL — ABNORMAL LOW (ref 3.87–5.11)
RDW: 14.8 % (ref 11.5–15.5)
WBC: 5 10*3/uL (ref 4.0–10.5)
nRBC: 0 % (ref 0.0–0.2)

## 2024-02-03 LAB — IRON AND TIBC
Iron: 42 ug/dL (ref 28–170)
Saturation Ratios: 11 % (ref 10.4–31.8)
TIBC: 370 ug/dL (ref 250–450)
UIBC: 328 ug/dL

## 2024-02-03 LAB — FERRITIN: Ferritin: 89 ng/mL (ref 11–307)

## 2024-02-17 ENCOUNTER — Other Ambulatory Visit: Payer: Self-pay | Admitting: Gastroenterology

## 2024-02-17 DIAGNOSIS — K5900 Constipation, unspecified: Secondary | ICD-10-CM

## 2024-02-20 NOTE — Telephone Encounter (Signed)
 Sent in appeal form and letter of necessity to insurance company. Waiting for approval/denial.

## 2024-02-21 NOTE — Telephone Encounter (Signed)
 PA for Octreotide Acetate solution has been approved from 12/25/2023 through 12/23/2024. Approval documents are scanned into patient's chart.

## 2024-02-28 ENCOUNTER — Other Ambulatory Visit: Payer: Self-pay

## 2024-02-28 DIAGNOSIS — D631 Anemia in chronic kidney disease: Secondary | ICD-10-CM

## 2024-02-28 DIAGNOSIS — D500 Iron deficiency anemia secondary to blood loss (chronic): Secondary | ICD-10-CM

## 2024-02-29 NOTE — Progress Notes (Unsigned)
 [facility] [address], [place] [address]   CLINIC:  Medical Oncology/Hematology  PCP:  Doreatha Massed, MD [address] [place] [address] [phone]   REASON FOR VISIT:  Follow-up for normocytic anemia  CURRENT THERAPY: IV iron + Retacrit  INTERVAL HISTORY:   [name] 78 y.o. 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  She was last seen by Rojelio Brenner PA-C on 10/28/2023.  At today's visit, she reports feeling fair.  No recent hospitalizations, surgeries, or changes in baseline health status.  She felt improved energy after her IV iron in November/December 2024.   She has been feeling a bit more fatigued for the past few weeks. She has mild recurrent ice pica.  Her chronic dyspnea on exertion is no worse than usual.   She denies any exertional chest pain, lightheadedness, or passing out.  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  She denies any recent hematemesis, melena, or hematochezia.  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   She has 6% energy and 75% appetite. She endorses that she is maintaining a stable weight.  ASSESSMENT & PLAN:  1.  Iron deficiency anemia - Seen at the request of Dr. Wolfgang Phoenix for evaluation and treatment of anemia in the setting of CKD stage IV and chronic iron deficiency from GI bleeding - She has had anemia since September 2021. 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 - She takes Protonix and self administers SQ octreotide twice daily.  She is on Eliquis for atrial fibrillation (cleared by GI). 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 - Hospitalized from 06/21/2022 through 06/23/2022 for symptomatic anemia.  Received 1 unit PRBC and IV Feraheme x500 mg during hospitalization. - Hematology work-up (07/02/2022): Mildly elevated reticulocytes 3.7%.  Normal LDH 140. CBC with Hgb at 9.7/MCV 92.2. Normal folate, copper, B12, MMA, homocystine. Negative SPEP and immunofixation.  Mildly elevated free light chain ratio in keeping with CKD stage IV. - She has intermittent bright red blood per rectum and melena, but this has resolved for the time being ever since small bowel endoscopy at [facility] in October 2023 - Failed to improve on oral iron supplementation - Epogen/Retacrit protocol initiated by nephrology in February 2022, intermittently requiring Retacrit injections. - Most recent Venofer 300 mg x3 in November/December 2024 -- She is taking vitamin B12 supplement 1,000 mcg daily - Labs (03/02/2024): Hgb 10.7/MCV 92.1, ferritin 34, iron saturation 17%.  Baseline CKD stage IIIb/IV with creatinine 1.58/GFR 34.  (Normal B12/MMA when checked in October 2024) - DIFFERENTIAL DIAGNOSIS: Since anemia resolved after IV iron repletion, suspect that her iron deficiency that iron deficiency is the main driving force in her anemia with some small impact due to her advanced CKD. - PLAN: Recommend IV Venofer 300 mg x 3.   - Continue CBC + possible Retacrit injections every 6 weeks. - Continue GI follow-up - Patient requests to see if we can give once monthly octreotide injections here rather than continuing self injections at home.  I will look into this and notify patient if that is feasible here  at our clinic, or if it can be prescribed by local GI to be given at [facility] - Repeat CBC, CMP, and iron panel with RTC in 3 months with same-day visit -- Repeat B12/MMA in 6 months (May 2025)   2.  Other history - PMH: CKD stage IV, secondary  hyperparathyroidism, chronic diastolic CHF, atrial fibrillation (Eliquis), hypertension, GERD, peripheral arterial disease, mild COPD, and iron deficiency from GI bleeds  - SOCIAL: She is retired and lives at home alone.  She is able to ambulate short distances without difficulty and is independent with ADLs.  She is a former smoker, smokes 1 PPD for 25 years, quit in 1998.  She denies any alcohol or illicit drug use. - FAMILY: Family history positive for mother with unspecified anemia.  Her sister had breast cancer.  Maternal grandmother also had breast cancer.  PLAN SUMMARY:  >> IV Venofer 300 mg x 3 >> CBC + Possible Retacrit every 6 weeks >> Same day labs (CBC/D, CMP, ferritin, iron/TIBC) + OFFICE visit + injection in 3 months     REVIEW OF SYSTEMS:  Review of Systems  Constitutional:  Positive for fatigue. Negative for appetite change, chills, diaphoresis, fever and unexpected weight change.  HENT:   Negative for lump/mass and nosebleeds.   Eyes:  Negative for eye problems.  Respiratory:  Positive for shortness of breath (with exertion). Negative for cough and hemoptysis.   Cardiovascular:  Negative for chest pain (at times, none today), leg swelling and palpitations.  Gastrointestinal:  Negative for abdominal pain, blood in stool, constipation, diarrhea, nausea and vomiting.  Genitourinary:  Negative for hematuria.   Skin: Negative.   Neurological:  Positive for headaches and numbness. Negative for dizziness and light-headedness.  Hematological:  Does not bruise/bleed easily.  Psychiatric/Behavioral:  Positive for sleep disturbance.      PHYSICAL EXAM:  ECOG PERFORMANCE STATUS: 2 - Symptomatic, <50% confined to bed  Vitals:   03/02/24 0928  BP: (!) 148/67  Pulse: (!) 58  Resp: 16  Temp: (!) 97.1 F (36.2 C)  SpO2: 100%    Filed Weights   03/02/24 0928  Weight: 172 lb 13.5 oz (78.4 kg)    Physical Exam Constitutional:      Appearance: Normal appearance. She is obese.   Cardiovascular:     Heart sounds: Normal heart sounds.  Pulmonary:     Breath sounds: Normal breath sounds.  Neurological:     General: No focal deficit present.     Mental Status: Mental status is at baseline.  Psychiatric:        Behavior: Behavior normal. Behavior is cooperative.     PAST MEDICAL/SURGICAL HISTORY:  Past Medical History:  Diagnosis Date   Anemia    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HCC) 09/11/2022   Chronic kidney disease    Constipation 07/17/2017   GERD (gastroesophageal reflux disease)    Gout    Grade II diastolic dysfunction 11/23/2020   Hyperlipidemia    Hypertension    Hypokalemia    OSA (obstructive sleep apnea) 08/09/2020   PAF (paroxysmal atrial fibrillation) (HCC)    PAF (paroxysmal atrial fibrillation) (HCC)    PUD (peptic ulcer disease)    Syncope    Neurocardiogenic   Past Surgical History:  Procedure Laterality Date   ABDOMINAL AORTOGRAM W/LOWER EXTREMITY Right 12/13/2020   Procedure: ABDOMINAL AORTOGRAM W/LOWER EXTREMITY;  Surgeon: Nada Libman, MD;  [facility]: [facility] IN[facility]AS[facility];  Service: Cardiovascular;  Laterality: Right;  ABDOMINAL HYSTERECTOMY     AMPUTATION Right 12/14/2020   Procedure: Right fifth toe amputation;  Surgeon: Larina Earthly, MD;  Location: [facility] OR;  Service: Vascular;  Laterality: Right;   CATARACT EXTRACTION Bilateral    COLONOSCOPY N/A 10/07/2014   Dr. Darrick Penna: redundant left colon, moderate sized external hemorrhoids    COLONOSCOPY N/A 03/11/2020   Surgeon: West Bali, MD;  9 polyps, majority of which were tubular adenomas, nodular mucosa in the anus s/p biopsy which was benign.   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   ESOPHAGOGASTRODUODENOSCOPY (EGD) WITH PROPOFOL N/A 11/25/2020    Surgeon: Corbin Ade, MD; Normal, s/p capsule deployment   GIVENS  CAPSULE STUDY N/A 11/25/2020    Surgeon: Corbin Ade, MD; 4/nonbleeding AVMs in the proximal small bowel large fresh blood, 2 bleeding lymphangiectasis in SB.    HOT HEMOSTASIS  11/27/2020   Procedure: HOT HEMOSTASIS (ARGON PLASMA COAGULATION/BICAP);  Surgeon: Marguerita Merles, Reuel Boom, MD;  Location: [facility];  Service: Gastroenterology;;   LIPOMA RESECTION     PERIPHERAL VASCULAR INTERVENTION Right 12/13/2020   Procedure: PERIPHERAL VASCULAR INTERVENTION;  Surgeon: Nada Libman, MD;  Location: [facility];  Service: Cardiovascular;  Laterality: Right;  SFA/PT/AT   POLYPECTOMY  03/11/2020   Procedure: POLYPECTOMY;  Surgeon: West Bali, MD;  Location: [facility];  Service: Endoscopy;;  cecal, transverse, descending, sigmoid   TEE WITHOUT CARDIOVERSION N/A 05/10/2021   Procedure: TRANSESOPHAGEAL ECHOCARDIOGRAM (TEE) WATCHMEN EVALUATION;  Surgeon: Wendall Stade, MD;  Location[facility]COPY;  Service: Cardiovascular;  Laterality: N/A;    SOCIAL HISTORY:  Social History   Socioeconomic History   Marital status: Single    Spouse name: Not on file   Number of children: Not on file   Years of education: Not on file   Highest education level: Not on file  Occupational History   Occupation: Retired  Tobacco Use   Smoking status: Former    Current packs/day: 0.00    Average packs/day: 1 pack/day for 33.6 years (33.6 ttl pk-yrs)    Types: Cigarettes    Start date: 03/16/1964    Quit date: 10/07/1997    Years since quitting: 26.4   Smokeless tobacco: Never  Vaping Use   Vaping status: Never Used  Substance and Sexual Activity   Alcohol use: No    Alcohol/week: 0.0 standard drinks of alcohol   Drug use: No   Sexual activity: Yes    Partners: Male  Other Topics Concern   Not on file  Social History Narrative   Not on file   Social Drivers of Health   Financial Resource Strain: Not on file  Food Insecurity: Not on file  Transportation Needs: Not on file   Physical Activity: Not on file  Stress: Not on file  Social Connections: Not on file  Intimate Partner Violence: Not on file    FAMILY HISTORY:  Family History  Problem Relation Age of Onset   Stroke Mother    Dementia Father    Cancer - Other Sister    Atrial fibrillation Sister    Heart failure Sister    Colon cancer Maternal Grandmother        diagnosed in her 13s    CURRENT MEDICATIONS:  Outpatient Encounter Medications as of 03/02/2024  Medication Sig Note   acetaminophen (TYLENOL) 500 MG  tablet Take 1,000 mg by mouth every 6 (six) hours as needed for moderate pain.    albuterol (VENTOLIN HFA) 108 (90 Base) MCG/ACT inhaler Inhale 2 puffs into the lungs every 6 (six) hours as needed for wheezing or shortness of breath.    apixaban (ELIQUIS) 5 MG TABS tablet Take 1 tablet by mouth 2 (two) times daily.    calcitRIOL (ROCALTROL) 0.25 MCG capsule Take 0.25 mcg by mouth 3 (three) times a week. Monday,Wednesday and Fridays    Cholecalciferol (VITAMIN D-3) 25 MCG (1000 UT) CAPS Take 1,000 Units by mouth daily.    Colchicine 0.6 MG CAPS Take by mouth as needed. 07/09/2023: PRN   cyclobenzaprine (FLEXERIL) 5 MG tablet Take 5 mg by mouth 3 (three) times daily. 06/18/2023: As needed per patient.    diclofenac Sodium (VOLTAREN) 1 % GEL Apply 1 application topically 4 (four) times daily as needed (pain).    flecainide (TAMBOCOR) 50 MG tablet TAKE 1 TABLET BY MOUTH TWICE A DAY    furosemide (LASIX) 20 MG tablet Take 20 mg by mouth daily. In the afternoon In addition to her 40 mg once per day.    furosemide (LASIX) 40 MG tablet TAKE 1 TABLET BY MOUTH EVERY DAY    gabapentin (NEURONTIN) 100 MG capsule Take 1 capsule (100 mg total) by mouth 3 (three) times daily. 06/18/2023: prn   hydrocortisone (ANUSOL-HC) 2.5 % rectal cream Place rectally 2 (two) times daily.    losartan (COZAAR) 50 MG tablet TAKE 1 TABLET BY MOUTH EVERY DAY    lubiprostone (AMITIZA) 24 MCG capsule Take 1 capsule (24 mcg total)  by mouth 2 (two) times daily with a meal.    octreotide (SANDOSTATIN) 100 MCG/ML SOLN injection INJECT 1 ML (100 MCG TOTAL) UNDER THE SKIN EVERY 12 (TWELVE) HOURS.    ondansetron (ZOFRAN) 4 MG tablet Take 1 tablet (4 mg total) by mouth every 6 (six) hours as needed for nausea.    pantoprazole (PROTONIX) 40 MG tablet Take 1 tablet (40 mg total) by mouth daily.    polyethylene glycol (MIRALAX / GLYCOLAX) 17 g packet Take 17 g by mouth daily as needed for mild constipation.    potassium chloride SA (KLOR-CON) 20 MEQ tablet Take 20 mEq by mouth in the morning, at noon, and at bedtime.    predniSONE (DELTASONE) 10 MG tablet Take 1 tablet (10 mg total) by mouth 3 (three) times daily. 06/18/2023: As needed.    rosuvastatin (CRESTOR) 10 MG tablet TAKE 1 TABLET BY MOUTH EVERY DAY    tiZANidine (ZANAFLEX) 4 MG tablet Take 1 tablet (4 mg total) by mouth every 6 (six) hours as needed for muscle spasms. 07/09/2023: AS NEEDED   vitamin C (ASCORBIC ACID) 250 MG tablet Take 250 mg by mouth daily.    No facility-administered encounter medications on file as of 03/02/2024.    ALLERGIES:  Allergies  Allergen Reactions   Acetaminophen-Codeine Hives, Itching and Rash   Amlodipine     Felt she retained fluid in her chest    Amoxicillin Hives    Did it involve swelling of the face/tongue/throat, SOB, or low BP? No Did it involve sudden or severe rash/hives, skin peeling, or any reaction on the inside of your mouth or nose? No Did you need to seek medical attention at a hospital or doctor's office? No When did it last happen?      10 + years If all above answers are "NO", may proceed with cephalosporin use.  Doxycycline     " sick and weak"   Allopurinol Hives and Rash   Tramadol Hives and Rash    LABORATORY DATA:  I have reviewed the labs as listed.  CBC    Component Value Date/Time   WBC 4.5 03/02/2024 0901   RBC 3.94 03/02/2024 0901   HGB 10.7 (L) 03/02/2024 0901   HCT 36.3 03/02/2024 0901   PLT  276 03/02/2024 0901   MCV 92.1 03/02/2024 0901   MCH 27.2 03/02/2024 0901   MCHC 29.5 (L) 03/02/2024 0901   RDW 14.9 03/02/2024 0901   LYMPHSABS 1.9 03/02/2024 0901   MONOABS 0.5 03/02/2024 0901   EOSABS 0.2 03/02/2024 0901   BASOSABS 0.0 03/02/2024 0901      Latest Ref Rng & Units 03/02/2024    9:01 AM 02/03/2024   10:54 AM 10/21/2023   10:34 AM  CMP  Glucose 70 - 99 mg/dL 99  295  92   BUN 8 - 23 mg/dL 22  17  22    Creatinine 0.44 - 1.00 mg/dL 6.21  3.08  6.57   Sodium 135 - 145 mmol/L 138  137  137   Potassium 3.5 - 5.1 mmol/L 4.8  3.9  4.4   Chloride 98 - 111 mmol/L 100  101  99   CO2 22 - 32 mmol/L 29  27  31    Calcium 8.9 - 10.3 mg/dL 9.2  8.9  8.3   Total Protein 6.5 - 8.1 g/dL 7.3  6.9  6.9   Total Bilirubin 0.0 - 1.2 mg/dL 0.5  0.5  0.4   Alkaline Phos 38 - 126 U/L 65  56  74   AST 15 - 41 U/L 23  22  33   ALT 0 - 44 U/L 11  13  21      DIAGNOSTIC IMAGING:  I have independently reviewed the relevant imaging and discussed with the patient.   WRAP UP:  All questions were answered. The patient knows to call the clinic with any problems, questions or concerns.  Medical decision making: Moderate  Time spent on visit: I spent 20 minutes counseling the patient face to face. The total time spent in the appointment was 30 minutes and more than 50% was on counseling.  Carnella Guadalajara, PA-C  03/02/24 12:08 PM

## 2024-03-02 ENCOUNTER — Inpatient Hospital Stay: Payer: Medicare HMO | Attending: Hematology | Admitting: Physician Assistant

## 2024-03-02 ENCOUNTER — Inpatient Hospital Stay: Payer: Medicare HMO

## 2024-03-02 VITALS — BP 148/67 | HR 58 | Temp 97.1°F | Resp 16 | Wt 172.8 lb

## 2024-03-02 DIAGNOSIS — E785 Hyperlipidemia, unspecified: Secondary | ICD-10-CM | POA: Insufficient documentation

## 2024-03-02 DIAGNOSIS — N184 Chronic kidney disease, stage 4 (severe): Secondary | ICD-10-CM | POA: Insufficient documentation

## 2024-03-02 DIAGNOSIS — D500 Iron deficiency anemia secondary to blood loss (chronic): Secondary | ICD-10-CM

## 2024-03-02 DIAGNOSIS — F5089 Other specified eating disorder: Secondary | ICD-10-CM | POA: Diagnosis not present

## 2024-03-02 DIAGNOSIS — K219 Gastro-esophageal reflux disease without esophagitis: Secondary | ICD-10-CM | POA: Insufficient documentation

## 2024-03-02 DIAGNOSIS — Z87891 Personal history of nicotine dependence: Secondary | ICD-10-CM | POA: Diagnosis not present

## 2024-03-02 DIAGNOSIS — G4733 Obstructive sleep apnea (adult) (pediatric): Secondary | ICD-10-CM | POA: Diagnosis not present

## 2024-03-02 DIAGNOSIS — Z79899 Other long term (current) drug therapy: Secondary | ICD-10-CM | POA: Insufficient documentation

## 2024-03-02 DIAGNOSIS — Z803 Family history of malignant neoplasm of breast: Secondary | ICD-10-CM | POA: Diagnosis not present

## 2024-03-02 DIAGNOSIS — I480 Paroxysmal atrial fibrillation: Secondary | ICD-10-CM | POA: Diagnosis not present

## 2024-03-02 DIAGNOSIS — I4891 Unspecified atrial fibrillation: Secondary | ICD-10-CM | POA: Diagnosis not present

## 2024-03-02 DIAGNOSIS — D631 Anemia in chronic kidney disease: Secondary | ICD-10-CM

## 2024-03-02 DIAGNOSIS — Z8711 Personal history of peptic ulcer disease: Secondary | ICD-10-CM | POA: Insufficient documentation

## 2024-03-02 DIAGNOSIS — I130 Hypertensive heart and chronic kidney disease with heart failure and stage 1 through stage 4 chronic kidney disease, or unspecified chronic kidney disease: Secondary | ICD-10-CM | POA: Diagnosis present

## 2024-03-02 LAB — CBC WITH DIFFERENTIAL/PLATELET
Abs Immature Granulocytes: 0.01 10*3/uL (ref 0.00–0.07)
Basophils Absolute: 0 10*3/uL (ref 0.0–0.1)
Basophils Relative: 1 %
Eosinophils Absolute: 0.2 10*3/uL (ref 0.0–0.5)
Eosinophils Relative: 4 %
HCT: 36.3 % (ref 36.0–46.0)
Hemoglobin: 10.7 g/dL — ABNORMAL LOW (ref 12.0–15.0)
Immature Granulocytes: 0 %
Lymphocytes Relative: 44 %
Lymphs Abs: 1.9 10*3/uL (ref 0.7–4.0)
MCH: 27.2 pg (ref 26.0–34.0)
MCHC: 29.5 g/dL — ABNORMAL LOW (ref 30.0–36.0)
MCV: 92.1 fL (ref 80.0–100.0)
Monocytes Absolute: 0.5 10*3/uL (ref 0.1–1.0)
Monocytes Relative: 11 %
Neutro Abs: 1.8 10*3/uL (ref 1.7–7.7)
Neutrophils Relative %: 40 %
Platelets: 276 10*3/uL (ref 150–400)
RBC: 3.94 MIL/uL (ref 3.87–5.11)
RDW: 14.9 % (ref 11.5–15.5)
WBC: 4.5 10*3/uL (ref 4.0–10.5)
nRBC: 0 % (ref 0.0–0.2)

## 2024-03-02 LAB — COMPREHENSIVE METABOLIC PANEL
ALT: 11 U/L (ref 0–44)
AST: 23 U/L (ref 15–41)
Albumin: 3.9 g/dL (ref 3.5–5.0)
Alkaline Phosphatase: 65 U/L (ref 38–126)
Anion gap: 9 (ref 5–15)
BUN: 22 mg/dL (ref 8–23)
CO2: 29 mmol/L (ref 22–32)
Calcium: 9.2 mg/dL (ref 8.9–10.3)
Chloride: 100 mmol/L (ref 98–111)
Creatinine, Ser: 1.58 mg/dL — ABNORMAL HIGH (ref 0.44–1.00)
GFR, Estimated: 34 mL/min — ABNORMAL LOW (ref >60)
Glucose, Bld: 99 mg/dL (ref 70–99)
Potassium: 4.8 mmol/L (ref 3.5–5.1)
Sodium: 138 mmol/L (ref 135–145)
Total Bilirubin: 0.5 mg/dL (ref 0.0–1.2)
Total Protein: 7.3 g/dL (ref 6.5–8.1)

## 2024-03-02 LAB — FERRITIN: Ferritin: 34 ng/mL (ref 11–307)

## 2024-03-02 LAB — IRON AND TIBC
Iron: 67 ug/dL (ref 28–170)
Saturation Ratios: 17 % (ref 10.4–31.8)
TIBC: 399 ug/dL (ref 250–450)
UIBC: 332 ug/dL

## 2024-03-02 MED ORDER — EPOETIN ALFA 10000 UNIT/ML IJ SOLN
10000.0000 [IU] | Freq: Once | INTRAMUSCULAR | Status: AC
  Administered 2024-03-02: 10000 [IU] via SUBCUTANEOUS
  Filled 2024-03-02: qty 1

## 2024-03-02 NOTE — Patient Instructions (Signed)
 [facility] at [facility] Discharge Instructions  You were seen today by Rojelio Brenner PA-C for your anemia.  Your anemia is due to your chronic kidney disease and iron deficiency from your history of GI bleeding.  Your iron levels are lower than normal.  We will schedule you for IV iron x 3 doses.  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  We will recheck labs and see you for office visit in 3 months.  Continue to follow-up with Dr. Wolfgang Phoenix for your chronic kidney disease, and Dr. Levon Hedger & [facility] for your chronic GI bleeding.  **We we will check to see if we can start giving octreotide infusions here at our clinic.  In the meantime, please continue to take octreotide injections twice daily as prescribed by your gastroenterologist.  ** Thank you for trusting me with your healthcare!  I strive to provide all of my patients with quality care at each visit.  If you receive a survey for this visit, I would be so grateful to you for taking the time to provide feedback.  Thank you in advance!  ~ Rishav Rockefeller                   Dr. Doreatha Massed   &   Rojelio Brenner, PA-C   - - - - - - - - - - - - - - - - - -     Thank you for choosing [facility] at [facility] to provide your oncology and hematology care.  To afford each patient quality time with our provider, please arrive at least 15 minutes before your scheduled appointment time.   If you have a lab appointment with the Cancer Center please come in thru the Main Entrance and check in at the main information desk.  You need to re-schedule your appointment should you arrive 10 or more minutes late.  We strive to give you quality time with our providers, and arriving late affects you and other patients whose appointments are after yours.  Also, if you no show three or more  times for appointments you may be dismissed from the clinic at the providers discretion.     Again, thank you for choosing [facility].  Our hope is that these requests will decrease the amount of time that you wait before being seen by our physicians.       _____________________________________________________________  Should you have questions after your visit to [facility], please contact our office at [phone] and follow the prompts.  Our office hours are 8:00 a.m. and 4:30 p.m. Monday - Friday.  Please note that voicemails left after 4:00 p.m. may not be returned until the following business day.  We are closed weekends and major holidays.  You do have access to a nurse 24-7, just call the main number to the clinic [phone] and do not press any options, hold on the line and a nurse will answer the phone.    For prescription refill requests, have your pharmacy contact our office and allow 72 hours.    Due to Covid, you will need to wear a mask upon entering the hospital. If you do not have a mask, a mask will be given to you at the Main Entrance upon arrival. For doctor visits, patients may have 1 support person age  18 or older with them. For treatment visits, patients can not have anyone with them due to social distancing guidelines and our immunocompromised population.

## 2024-03-02 NOTE — Progress Notes (Signed)
 Retacrit injection given per orders. Patient tolerated it well without problems. Vitals stable and discharged home from clinic ambulatory. Follow up as scheduled.

## 2024-03-03 NOTE — Telephone Encounter (Signed)
 Hi! I didn't see this patient last. Just forwarding it as there is a question of linzess not being covered.

## 2024-03-04 ENCOUNTER — Encounter: Payer: Self-pay | Admitting: Physician Assistant

## 2024-03-06 ENCOUNTER — Other Ambulatory Visit: Payer: Self-pay | Admitting: Gastroenterology

## 2024-03-06 DIAGNOSIS — K5521 Angiodysplasia of colon with hemorrhage: Secondary | ICD-10-CM

## 2024-03-06 DIAGNOSIS — K5520 Angiodysplasia of colon without hemorrhage: Secondary | ICD-10-CM

## 2024-03-08 ENCOUNTER — Other Ambulatory Visit: Payer: Self-pay | Admitting: Cardiology

## 2024-03-08 DIAGNOSIS — I480 Paroxysmal atrial fibrillation: Secondary | ICD-10-CM

## 2024-03-09 ENCOUNTER — Inpatient Hospital Stay

## 2024-03-09 ENCOUNTER — Telehealth: Payer: Self-pay

## 2024-03-09 VITALS — BP 121/55 | HR 70 | Temp 98.6°F | Resp 18

## 2024-03-09 DIAGNOSIS — D500 Iron deficiency anemia secondary to blood loss (chronic): Secondary | ICD-10-CM

## 2024-03-09 DIAGNOSIS — D631 Anemia in chronic kidney disease: Secondary | ICD-10-CM

## 2024-03-09 DIAGNOSIS — I130 Hypertensive heart and chronic kidney disease with heart failure and stage 1 through stage 4 chronic kidney disease, or unspecified chronic kidney disease: Secondary | ICD-10-CM | POA: Diagnosis not present

## 2024-03-09 MED ORDER — CETIRIZINE HCL 10 MG PO TABS
10.0000 mg | ORAL_TABLET | Freq: Once | ORAL | Status: DC
Start: 2024-03-09 — End: 2024-03-09

## 2024-03-09 MED ORDER — SODIUM CHLORIDE 0.9 % IV SOLN
300.0000 mg | Freq: Once | INTRAVENOUS | Status: AC
  Administered 2024-03-09: 300 mg via INTRAVENOUS
  Filled 2024-03-09: qty 100

## 2024-03-09 MED ORDER — SODIUM CHLORIDE 0.9 % IV SOLN: 300.0000 mg | Freq: Once | INTRAVENOUS | Status: DC

## 2024-03-09 MED ORDER — ACETAMINOPHEN 325 MG PO TABS: 650.0000 mg | ORAL_TABLET | Freq: Once | ORAL | Status: DC

## 2024-03-09 MED ORDER — SODIUM CHLORIDE 0.9 % IV SOLN: Freq: Once | INTRAVENOUS | Status: AC

## 2024-03-09 NOTE — Telephone Encounter (Signed)
 Hello,  Patient will be scheduled as soon as possible  Auth Submission: APPROVED Site of care: Site of care: [facility] Payer: aetna medicare Medication & CPT/J Code(s) submitted: Sandostatin (Octreotide LAR) L8756 Route of submission (phone, fax, portal): portal Phone # Fax # Auth type: Buy/Bill PB Units/visits requested: 20mg , q28days Reference number: [id number] Approval from: 03/06/24 to 03/06/25

## 2024-03-09 NOTE — Patient Instructions (Signed)

## 2024-03-09 NOTE — Telephone Encounter (Signed)
 Prescription refill request for Eliquis received. Indication:afib Last office visit:7/24 Scr:1.58  3/25 Age: 78 Weight:78.4  kg  Prescription refilled

## 2024-03-09 NOTE — Progress Notes (Signed)
 Patient tolerated iron infusion with no complaints voiced.  Peripheral IV site clean and dry with good blood return noted before and after infusion.  Band aid applied.  VSS with discharge and left in satisfactory condition with no s/s of distress noted.

## 2024-03-16 ENCOUNTER — Inpatient Hospital Stay

## 2024-03-17 ENCOUNTER — Inpatient Hospital Stay

## 2024-03-17 ENCOUNTER — Encounter: Attending: Gastroenterology | Admitting: *Deleted

## 2024-03-17 VITALS — BP 176/63 | HR 57 | Temp 97.5°F | Resp 16

## 2024-03-17 VITALS — BP 138/58 | HR 51 | Temp 98.4°F | Resp 18

## 2024-03-17 DIAGNOSIS — K5521 Angiodysplasia of colon with hemorrhage: Secondary | ICD-10-CM

## 2024-03-17 DIAGNOSIS — I130 Hypertensive heart and chronic kidney disease with heart failure and stage 1 through stage 4 chronic kidney disease, or unspecified chronic kidney disease: Secondary | ICD-10-CM | POA: Diagnosis not present

## 2024-03-17 DIAGNOSIS — D631 Anemia in chronic kidney disease: Secondary | ICD-10-CM

## 2024-03-17 DIAGNOSIS — K5520 Angiodysplasia of colon without hemorrhage: Secondary | ICD-10-CM

## 2024-03-17 DIAGNOSIS — D500 Iron deficiency anemia secondary to blood loss (chronic): Secondary | ICD-10-CM

## 2024-03-17 MED ORDER — SODIUM CHLORIDE 0.9 % IV SOLN
300.0000 mg | Freq: Once | INTRAVENOUS | Status: AC
  Administered 2024-03-17: 300 mg via INTRAVENOUS
  Filled 2024-03-17: qty 300

## 2024-03-17 MED ORDER — OCTREOTIDE ACETATE 20 MG IM KIT
20.0000 mg | PACK | Freq: Once | INTRAMUSCULAR | Status: AC
Start: 2024-03-17 — End: 2024-03-17
  Administered 2024-03-17: 20 mg via INTRAMUSCULAR

## 2024-03-17 MED ORDER — SODIUM CHLORIDE 0.9 % IV SOLN: Freq: Once | INTRAVENOUS | Status: AC

## 2024-03-17 NOTE — Patient Instructions (Signed)
 [facility] [place] [facility]  Discharge Instructions: Thank you for choosing [facility] to provide your oncology and hematology care.  If you have a lab appointment with the Cancer Center - please note that after April 8th, 2024, all labs will be drawn in the cancer center.  You do not have to check in or register with the main entrance as you have in the past but will complete your check-in in the cancer center.  Wear comfortable clothing and clothing appropriate for easy access to any Portacath or PICC line.   We strive to give you quality time with your provider. You may need to reschedule your appointment if you arrive late (15 or more minutes).  Arriving late affects you and other patients whose appointments are after yours.  Also, if you miss three or more appointments without notifying the office, you may be dismissed from the clinic at the provider's discretion.      For prescription refill requests, have your pharmacy contact our office and allow 72 hours for refills to be completed.    Today you received the following:  Venofer.    Iron Sucrose Injection What is this medication? IRON SUCROSE (EYE ern SOO krose) treats low levels of iron (iron deficiency anemia) in people with kidney disease. Iron is a mineral that plays an important role in making red blood cells, which carry oxygen from your lungs to the rest of your body. This medicine may be used for other purposes; ask your health care provider or pharmacist if you have questions. COMMON BRAND NAME(S): Venofer What should I tell my care team before I take this medication? They need to know if you have any of these conditions: Anemia not caused by low iron levels Heart disease High levels of iron in the blood Kidney disease Liver disease An unusual or allergic reaction to iron, other medications, foods, dyes, or preservatives Pregnant or trying to get  pregnant Breastfeeding How should I use this medication? This medication is infused into a vein. It is given by your care team in a hospital or clinic setting. Talk to your care team about the use of this medication in children. While it may be prescribed for children as young as 2 years for selected conditions, precautions do apply. Overdosage: If you think you have taken too much of this medicine contact a poison control center or emergency room at once. NOTE: This medicine is only for you. Do not share this medicine with others. What if I miss a dose? Keep appointments for follow-up doses. It is important not to miss your dose. Call your care team if you are unable to keep an appointment. What may interact with this medication? Do not take this medication with any of the following: Deferoxamine Dimercaprol Other iron products This medication may also interact with the following: Chloramphenicol Deferasirox This list may not describe all possible interactions. Give your health care provider a list of all the medicines, herbs, non-prescription drugs, or dietary supplements you use. Also tell them if you smoke, drink alcohol, or use illegal drugs. Some items may interact with your medicine. What should I watch for while using this medication? Visit your care team for regular checks on your progress. Tell your care team if your symptoms do not start to get better or if they get worse. You may need blood work done while you are taking this medication. You may need to eat  more foods that contain iron. Talk to your care team. Foods that contain iron include whole grains or cereals, dried fruits, beans, peas, leafy green vegetables, and organ meats (liver, kidney). What side effects may I notice from receiving this medication? Side effects that you should report to your care team as soon as possible: Allergic reactions--skin rash, itching, hives, swelling of the face, lips, tongue, or throat Low  blood pressure--dizziness, feeling faint or lightheaded, blurry vision Shortness of breath Side effects that usually do not require medical attention (report to your care team if they continue or are bothersome): Flushing Headache Joint pain Muscle pain Nausea Pain, redness, or irritation at injection site This list may not describe all possible side effects. Call your doctor for medical advice about side effects. You may report side effects to FDA at 1-800-FDA-1088. Where should I keep my medication? This medication is given in a hospital or clinic. It will not be stored at home. NOTE: This sheet is a summary. It may not cover all possible information. If you have questions about this medicine, talk to your doctor, pharmacist, or health care provider.  2024 Elsevier/Gold Standard (2023-07-31 00:00:00)   To help prevent nausea and vomiting after your treatment, we encourage you to take your nausea medication as directed.  BELOW ARE SYMPTOMS THAT SHOULD BE REPORTED IMMEDIATELY: *FEVER GREATER THAN 100.4 F (38 C) OR HIGHER *CHILLS OR SWEATING *NAUSEA AND VOMITING THAT IS NOT CONTROLLED WITH YOUR NAUSEA MEDICATION *UNUSUAL SHORTNESS OF BREATH *UNUSUAL BRUISING OR BLEEDING *URINARY PROBLEMS (pain or burning when urinating, or frequent urination) *BOWEL PROBLEMS (unusual diarrhea, constipation, pain near the anus) TENDERNESS IN MOUTH AND THROAT WITH OR WITHOUT PRESENCE OF ULCERS (sore throat, sores in mouth, or a toothache) UNUSUAL RASH, SWELLING OR PAIN  UNUSUAL VAGINAL DISCHARGE OR ITCHING   Items with * indicate a potential emergency and should be followed up as soon as possible or go to the Emergency Department if any problems should occur.  Please show the CHEMOTHERAPY ALERT CARD or IMMUNOTHERAPY ALERT CARD at check-in to the Emergency Department and triage nurse.  Should you have questions after your visit or need to cancel or reschedule your appointment, please contact [facility] CANCER  CTR [place] - A DEPT OF [facility] [phone]  and follow the prompts.  Office hours are 8:00 a.m. to 4:30 p.m. Monday - Friday. Please note that voicemails left after 4:00 p.m. may not be returned until the following business day.  We are closed weekends and major holidays. You have access to a nurse at all times for urgent questions. Please call the main number to the clinic [phone] and follow the prompts.  For any non-urgent questions, you may also contact your provider using MyChart. We now offer e-Visits for anyone 24 and older to request care online for non-urgent symptoms. For details visit [email or web address].   Also download the MyChart app! Go to the app store, search "MyChart", open the app, select [place], and log in with your MyChart username and password.

## 2024-03-17 NOTE — Progress Notes (Signed)
 Patient presents today for iron infusion.  Patient is in satisfactory condition with no new complaints voiced.  Vital signs are stable.  IV placed in L arm.  IV flushed well with good blood return noted.  Tylenol and Zyrtec taken at 0700 at home prior to visit. We will proceed with infusion per provider orders.    Patient tolerated infusion well with no complaints voiced.  Patient left ambulatory with rolling walker in stable condition.  Vital signs stable at discharge.  Follow up as scheduled.

## 2024-03-17 NOTE — Progress Notes (Signed)
 Diagnosis: AVM of small bowel  Provider:  Brooke Bonito NP  Procedure: Injection  Sandostatin, Dose: 20 mg, Site: intramuscular, Number of injections: 1  Injection Site(s): Right upper quad. gluteus  Post Care: Observation period completed  Discharge: Condition: Good, Destination: Home . AVS Provided  Performed by:  Daleen Squibb, RN

## 2024-03-23 ENCOUNTER — Other Ambulatory Visit: Payer: Self-pay | Admitting: Internal Medicine

## 2024-03-23 ENCOUNTER — Other Ambulatory Visit: Payer: Self-pay | Admitting: Gastroenterology

## 2024-03-23 DIAGNOSIS — K625 Hemorrhage of anus and rectum: Secondary | ICD-10-CM

## 2024-03-24 ENCOUNTER — Inpatient Hospital Stay: Attending: Hematology

## 2024-03-24 VITALS — BP 127/53 | HR 53 | Temp 97.8°F | Resp 17

## 2024-03-24 DIAGNOSIS — N184 Chronic kidney disease, stage 4 (severe): Secondary | ICD-10-CM | POA: Insufficient documentation

## 2024-03-24 DIAGNOSIS — D631 Anemia in chronic kidney disease: Secondary | ICD-10-CM | POA: Diagnosis not present

## 2024-03-24 DIAGNOSIS — Z79899 Other long term (current) drug therapy: Secondary | ICD-10-CM | POA: Diagnosis not present

## 2024-03-24 DIAGNOSIS — I130 Hypertensive heart and chronic kidney disease with heart failure and stage 1 through stage 4 chronic kidney disease, or unspecified chronic kidney disease: Secondary | ICD-10-CM | POA: Insufficient documentation

## 2024-03-24 DIAGNOSIS — D500 Iron deficiency anemia secondary to blood loss (chronic): Secondary | ICD-10-CM

## 2024-03-24 MED ORDER — SODIUM CHLORIDE 0.9 % IV SOLN: Freq: Once | INTRAVENOUS | Status: AC

## 2024-03-24 MED ORDER — SODIUM CHLORIDE 0.9 % IV SOLN
300.0000 mg | Freq: Once | INTRAVENOUS | Status: AC
  Administered 2024-03-24: 300 mg via INTRAVENOUS
  Filled 2024-03-24: qty 300

## 2024-03-24 NOTE — Progress Notes (Signed)
 Patient presents today for Venofer 300 mg infusion. Vital signs are stable. Patient denies any side effects related to last iron infusion.   Venofer given today per MD orders. Tolerated infusion without adverse affects. Vital signs stable. No complaints at this time. Discharged from clinic ambulatory in stable condition. Alert and oriented x 3. F/U with [facility] as scheduled.

## 2024-03-24 NOTE — Patient Instructions (Signed)
 [facility] [place] [facility]  Discharge Instructions: Thank you for choosing [facility] to provide your oncology and hematology care.  If you have a lab appointment with the Cancer Center - please note that after April 8th, 2024, all labs will be drawn in the cancer center.  You do not have to check in or register with the main entrance as you have in the past but will complete your check-in in the cancer center.  Wear comfortable clothing and clothing appropriate for easy access to any Portacath or PICC line.   We strive to give you quality time with your provider. You may need to reschedule your appointment if you arrive late (15 or more minutes).  Arriving late affects you and other patients whose appointments are after yours.  Also, if you miss three or more appointments without notifying the office, you may be dismissed from the clinic at the provider's discretion.      For prescription refill requests, have your pharmacy contact our office and allow 72 hours for refills to be completed.    Today you received the following chemotherapy and/or immunotherapy agents Venofer. Iron Sucrose Injection What is this medication? IRON SUCROSE (EYE ern SOO krose) treats low levels of iron (iron deficiency anemia) in people with kidney disease. Iron is a mineral that plays an important role in making red blood cells, which carry oxygen from your lungs to the rest of your body. This medicine may be used for other purposes; ask your health care provider or pharmacist if you have questions. COMMON BRAND NAME(S): Venofer What should I tell my care team before I take this medication? They need to know if you have any of these conditions: Anemia not caused by low iron levels Heart disease High levels of iron in the blood Kidney disease Liver disease An unusual or allergic reaction to iron, other medications, foods, dyes, or preservatives Pregnant or  trying to get pregnant Breastfeeding How should I use this medication? This medication is infused into a vein. It is given by your care team in a hospital or clinic setting. Talk to your care team about the use of this medication in children. While it may be prescribed for children as young as 2 years for selected conditions, precautions do apply. Overdosage: If you think you have taken too much of this medicine contact a poison control center or emergency room at once. NOTE: This medicine is only for you. Do not share this medicine with others. What if I miss a dose? Keep appointments for follow-up doses. It is important not to miss your dose. Call your care team if you are unable to keep an appointment. What may interact with this medication? Do not take this medication with any of the following: Deferoxamine Dimercaprol Other iron products This medication may also interact with the following: Chloramphenicol Deferasirox This list may not describe all possible interactions. Give your health care provider a list of all the medicines, herbs, non-prescription drugs, or dietary supplements you use. Also tell them if you smoke, drink alcohol, or use illegal drugs. Some items may interact with your medicine. What should I watch for while using this medication? Visit your care team for regular checks on your progress. Tell your care team if your symptoms do not start to get better or if they get worse. You may need blood work done while you are taking this medication. You may need to eat  more foods that contain iron. Talk to your care team. Foods that contain iron include whole grains or cereals, dried fruits, beans, peas, leafy green vegetables, and organ meats (liver, kidney). What side effects may I notice from receiving this medication? Side effects that you should report to your care team as soon as possible: Allergic reactions--skin rash, itching, hives, swelling of the face, lips, tongue,  or throat Low blood pressure--dizziness, feeling faint or lightheaded, blurry vision Shortness of breath Side effects that usually do not require medical attention (report to your care team if they continue or are bothersome): Flushing Headache Joint pain Muscle pain Nausea Pain, redness, or irritation at injection site This list may not describe all possible side effects. Call your doctor for medical advice about side effects. You may report side effects to FDA at 1-800-FDA-1088. Where should I keep my medication? This medication is given in a hospital or clinic. It will not be stored at home. NOTE: This sheet is a summary. It may not cover all possible information. If you have questions about this medicine, talk to your doctor, pharmacist, or health care provider.  2024 Elsevier/Gold Standard (2023-07-31 00:00:00)      To help prevent nausea and vomiting after your treatment, we encourage you to take your nausea medication as directed.  BELOW ARE SYMPTOMS THAT SHOULD BE REPORTED IMMEDIATELY: *FEVER GREATER THAN 100.4 F (38 C) OR HIGHER *CHILLS OR SWEATING *NAUSEA AND VOMITING THAT IS NOT CONTROLLED WITH YOUR NAUSEA MEDICATION *UNUSUAL SHORTNESS OF BREATH *UNUSUAL BRUISING OR BLEEDING *URINARY PROBLEMS (pain or burning when urinating, or frequent urination) *BOWEL PROBLEMS (unusual diarrhea, constipation, pain near the anus) TENDERNESS IN MOUTH AND THROAT WITH OR WITHOUT PRESENCE OF ULCERS (sore throat, sores in mouth, or a toothache) UNUSUAL RASH, SWELLING OR PAIN  UNUSUAL VAGINAL DISCHARGE OR ITCHING   Items with * indicate a potential emergency and should be followed up as soon as possible or go to the Emergency Department if any problems should occur.  Please show the CHEMOTHERAPY ALERT CARD or IMMUNOTHERAPY ALERT CARD at check-in to the Emergency Department and triage nurse.  Should you have questions after your visit or need to cancel or reschedule your appointment, please  contact [facility]CER CTR [place] - A DEPT OF [facility] [phone]  and follow the prompts.  Office hours are 8:00 a.m. to 4:30 p.m. Monday - Friday. Please note that voicemails left after 4:00 p.m. may not be returned until the following business day.  We are closed weekends and major holidays. You have access to a nurse at all times for urgent questions. Please call the main number to the clinic [phone] and follow the prompts.  For any non-urgent questions, you may also contact your provider using MyChart. We now offer e-Visits for anyone 70 and older to request care online for non-urgent symptoms. For details visit [email or web address].   Also download the MyChart app! Go to the app store, search "MyChart", open the app, select [place], and log in with your MyChart username and password.

## 2024-04-13 ENCOUNTER — Inpatient Hospital Stay

## 2024-04-13 DIAGNOSIS — D500 Iron deficiency anemia secondary to blood loss (chronic): Secondary | ICD-10-CM

## 2024-04-13 DIAGNOSIS — I130 Hypertensive heart and chronic kidney disease with heart failure and stage 1 through stage 4 chronic kidney disease, or unspecified chronic kidney disease: Secondary | ICD-10-CM | POA: Diagnosis not present

## 2024-04-13 LAB — CBC
HCT: 37.2 % (ref 36.0–46.0)
Hemoglobin: 11.1 g/dL — ABNORMAL LOW (ref 12.0–15.0)
MCH: 28.3 pg (ref 26.0–34.0)
MCHC: 29.8 g/dL — ABNORMAL LOW (ref 30.0–36.0)
MCV: 94.9 fL (ref 80.0–100.0)
Platelets: 250 10*3/uL (ref 150–400)
RBC: 3.92 MIL/uL (ref 3.87–5.11)
RDW: 16.7 % — ABNORMAL HIGH (ref 11.5–15.5)
WBC: 4.3 10*3/uL (ref 4.0–10.5)
nRBC: 0 % (ref 0.0–0.2)

## 2024-04-13 NOTE — Progress Notes (Signed)
 Hemoglobin is 11.1 today, no injection needed.

## 2024-04-14 ENCOUNTER — Encounter: Attending: Gastroenterology | Admitting: *Deleted

## 2024-04-14 VITALS — BP 144/66 | HR 77 | Temp 98.1°F | Resp 18

## 2024-04-14 DIAGNOSIS — K5521 Angiodysplasia of colon with hemorrhage: Secondary | ICD-10-CM | POA: Diagnosis not present

## 2024-04-14 DIAGNOSIS — K5520 Angiodysplasia of colon without hemorrhage: Secondary | ICD-10-CM | POA: Insufficient documentation

## 2024-04-14 MED ORDER — OCTREOTIDE ACETATE 20 MG IM KIT
20.0000 mg | PACK | Freq: Once | INTRAMUSCULAR | Status: AC
  Administered 2024-04-14: 20 mg via INTRAMUSCULAR

## 2024-04-14 NOTE — Progress Notes (Signed)
 Diagnosis: AVM of small bowel  Provider:  Julian Obey NP  Procedure: Injection  Sandostatin , Dose: 20 mg, Site: intramuscular, Number of injections: 1  Injection Site(s): Left upper quad. gluteus  Post Care: Observation period completed  Discharge: Condition: Good, Destination: Home . AVS Provided  Performed by:  Teng Decou Ragsdale, RN

## 2024-05-08 ENCOUNTER — Telehealth: Payer: Self-pay | Admitting: *Deleted

## 2024-05-08 NOTE — Telephone Encounter (Signed)
 Spoke informed her of recommendations. Pt voiced understanding. Pt states she is constipated. She states takes MiraLax  once a day. Informed her to take MiraLax  twice a day with plenty of fluid. Informed to call next week with in update

## 2024-05-08 NOTE — Telephone Encounter (Signed)
 Pt called and states that she is having problems with hemorrhoids. She states she can't have a bowel movement, because it hurts. Please advise.

## 2024-05-11 NOTE — Telephone Encounter (Signed)
 Spoke to pt informed her of recommendations. She voiced understanding.

## 2024-05-12 ENCOUNTER — Encounter: Attending: Gastroenterology | Admitting: *Deleted

## 2024-05-12 VITALS — BP 134/66 | HR 64 | Temp 98.1°F | Resp 16

## 2024-05-12 DIAGNOSIS — K5520 Angiodysplasia of colon without hemorrhage: Secondary | ICD-10-CM | POA: Insufficient documentation

## 2024-05-12 DIAGNOSIS — K5521 Angiodysplasia of colon with hemorrhage: Secondary | ICD-10-CM | POA: Diagnosis not present

## 2024-05-12 MED ORDER — OCTREOTIDE ACETATE 20 MG IM KIT
20.0000 mg | PACK | Freq: Once | INTRAMUSCULAR | Status: AC
  Administered 2024-05-12: 20 mg via INTRAMUSCULAR

## 2024-05-12 NOTE — Progress Notes (Signed)
 Diagnosis: AVM of small bowel  Provider:  Brooke Bonito NP  Procedure: Injection  Sandostatin, Dose: 20 mg, Site: intramuscular, Number of injections: 1  Injection Site(s): Right upper quad. gluteus  Post Care: Observation period completed  Discharge: Condition: Good, Destination: Home . AVS Provided  Performed by:  Daleen Squibb, RN

## 2024-05-24 NOTE — Progress Notes (Unsigned)
 [facility] [address], [place] [address]   CLINIC:  Medical Oncology/Hematology  PCP:  Paulett Boros, MD [address] [place] [address] [phone]   REASON FOR VISIT:  Follow-up for normocytic anemia  CURRENT THERAPY: IV iron  + Retacrit   INTERVAL HISTORY:   Ms. Gilberto 78 y.o. 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  She was last seen by Sheril Dines PA-C on 03/02/2024.  At today's visit, she reports feeling fairly well.  No recent hospitalizations, surgeries, or changes in baseline health status.  She felt improved energy after her IV iron  in March/April 2025.   She reports good energy at today's visit. She has denies any recurrent ice pica. Her chronic dyspnea on exertion is no worse than usual.    She denies any exertional chest pain, lightheadedness, or passing out. She has occasional headaches.  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 She had some hemorrhoid bleeding last week, which she believes was due to exacerbation of constipation while waiting for her Amitiza  to be authorized.  She denies any recent hematemesis or melena. 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   She has 75% energy and 75% appetite. She endorses that she is maintaining a stable weight.  ASSESSMENT & PLAN:  1.  Iron  deficiency anemia - Seen at the request of Dr. Carrolyn Clan for evaluation and treatment of anemia in the setting of CKD stage IV and chronic iron  deficiency from GI bleeding - She has had anemia since September 2021. 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 - She receives monthly  octreotide  injections via GI.   - She is on Eliquis  for atrial fibrillation (cleared by GI). 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 - Hospitalized from 06/21/2022 through 06/23/2022 for symptomatic anemia.  Received 1 unit PRBC and IV Feraheme  x500 mg during hospitalization. - Hematology work-up (07/02/2022): Mildly elevated reticulocytes 3.7%.  Normal LDH 140. CBC with Hgb at 9.7/MCV 92.2. Normal folate, copper , B12, MMA, homocystine. Negative SPEP and immunofixation.  Mildly elevated free light chain ratio in keeping with CKD stage IV. - Rectal bleeding or melena - Failed to improve on oral iron  supplementation - Epogen /Retacrit  protocol initiated by nephrology in February 2022, intermittently requiring Retacrit  injections (last given on 03/02/2024). - Most recent Venofer  300 mg x3 in March/April 2025 -- She is taking vitamin B12 supplement 1,000 mcg daily - Labs (05/25/24): Hgb 11.5/MCV 94.4, ferritin 59, iron  saturation 12%.  Baseline CKD stage IIIb/IV with creatinine 1.64/GFR 32.  (Normal B12/MMA when checked in October 2024) - DIFFERENTIAL DIAGNOSIS: Since anemia resolved after IV iron  repletion, suspect that her iron  deficiency that iron  deficiency is the main driving force in her anemia with some small impact due to her advanced CKD. - PLAN: Recommend IV Venofer  400 mg x 2. - Continue CBC + possible Retacrit  injections every 6 weeks. - Continue GI follow-up and octreotide  injections per their discretion - Repeat CBC, CMP, and iron  panel with RTC in 3 months with same-day visit - Repeat B12/MMA at follow-up   2.  Other history - PMH: CKD stage IV, secondary hyperparathyroidism, chronic diastolic  CHF, atrial fibrillation (Eliquis ), hypertension, GERD, peripheral arterial disease, mild COPD, and iron  deficiency from GI bleeds  - SOCIAL: She is retired and lives at home alone.  She is able to  ambulate short distances without difficulty and is independent with ADLs.  She is a former smoker, smokes 1 PPD for 25 years, quit in 1998.  She denies any alcohol or illicit drug use. - FAMILY: Family history positive for mother with unspecified anemia.  Her sister had breast cancer.  Maternal grandmother also had breast cancer.  PLAN SUMMARY:  >> IV Venofer  400 mg x2 >> CBC + Possible Retacrit  every 6 weeks >> Same day labs (CBC/D, CMP, ferritin, iron /TIBC, B12, MMA) + OFFICE visit + injection in 3 months     REVIEW OF SYSTEMS:  Review of Systems  Constitutional:  Positive for fatigue. Negative for appetite change, chills, diaphoresis, fever and unexpected weight change.  HENT:   Negative for lump/mass and nosebleeds.   Eyes:  Negative for eye problems.  Respiratory:  Positive for shortness of breath (with exertion, at baseline). Negative for cough and hemoptysis.   Cardiovascular:  Negative for chest pain, leg swelling and palpitations.  Gastrointestinal:  Positive for constipation. Negative for abdominal pain, blood in stool, diarrhea, nausea and vomiting.  Genitourinary:  Negative for hematuria.   Musculoskeletal:  Positive for arthralgias (left foot).  Skin: Negative.   Neurological:  Positive for headaches and numbness. Negative for dizziness and light-headedness.  Hematological:  Does not bruise/bleed easily.  Psychiatric/Behavioral:  Positive for sleep disturbance.      PHYSICAL EXAM:  ECOG PERFORMANCE STATUS: 2 - Symptomatic, <50% confined to bed  Vitals:   05/25/24 0945  BP: 133/70  Pulse: (!) 55  Resp: 16  Temp: 97.8 F (36.6 C)  SpO2: 96%    Physical Exam Constitutional:      Appearance: Normal appearance. She is obese.  Cardiovascular:     Rate and Rhythm: Bradycardia present.     Heart sounds: Normal heart sounds.     Comments: Strong S2 Pulmonary:     Breath sounds: Normal breath sounds.  Neurological:     General: No focal deficit present.     Mental  Status: Mental status is at baseline.  Psychiatric:        Behavior: Behavior normal. Behavior is cooperative.    PAST MEDICAL/SURGICAL HISTORY:  Past Medical History:  Diagnosis Date   Anemia    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HCC) 09/11/2022   Chronic kidney disease    Constipation 07/17/2017   GERD (gastroesophageal reflux disease)    Gout    Grade II diastolic dysfunction 11/23/2020   Hyperlipidemia    Hypertension    Hypokalemia    OSA (obstructive sleep apnea) 08/09/2020   PAF (paroxysmal atrial fibrillation) (HCC)    PAF (paroxysmal atrial fibrillation) (HCC)    PUD (peptic ulcer disease)    Syncope    Neurocardiogenic   Past Surgical History:  Procedure Laterality Date   ABDOMINAL AORTOGRAM W/LOWER EXTREMITY Right 12/13/2020   Procedure: ABDOMINAL AORTOGRAM W/LOWER EXTREMITY;  Surgeon: Margherita Shell, MD;  [facility]: [facility];  Service: Cardiovascular;  Laterality: Right;   ABDOMINAL HYSTERECTOMY     AMPUTATION Right 12/14/2020   Procedure: Right fifth toe amputation;  Surgeon: Mayo Speck, MD;  Location: [facility];  Service: Vascular;  Laterality: Right;   CATARACT EXTRACTION Bilateral    COLONOSCOPY N/A 10/07/2014   Dr. Nolene Baumgarten: redundant left colon, moderate sized  external hemorrhoids    COLONOSCOPY N/A 03/11/2020   Surgeon: Alyce Jubilee, MD;  9 polyps, majority of which were tubular adenomas, nodular mucosa in the anus s/p biopsy which was benign.   ENTEROSCOPY N/A 11/27/2020   Surgeon: Umberto Ganong, Bearl Limes, MD; multiple nonbleeding AVMs in the duodenum s/p APC therapy, few nonbleeding AVMs in jejunum s/p argon beam coagulation.   ESOPHAGOGASTRODUODENOSCOPY (EGD) WITH PROPOFOL  N/A 11/25/2020    Surgeon: Suzette Espy, MD; Normal, s/p capsule deployment   GIVENS CAPSULE STUDY N/A 11/25/2020    Surgeon: Suzette Espy, MD; 4/nonbleeding AVMs in the proximal small bowel large fresh blood, 2 bleeding lymphangiectasis in SB.    HOT HEMOSTASIS   11/27/2020   Procedure: HOT HEMOSTASIS (ARGON PLASMA COAGULATION/BICAP);  Surgeon: Umberto Ganong, Bearl Limes, MD;  Location: [facility];  Service: Gastroenterology;;   LIPOMA RESECTION     PERIPHERAL VASCULAR INTERVENTION Right 12/13/2020   Procedure: PERIPHERAL VASCULAR INTERVENTION;  Surgeon: Margherita Shell, MD;  Location: [facility];  Service: Cardiovascular;  Laterality: Right;  SFA/PT/AT   POLYPECTOMY  03/11/2020   Procedure: POLYPECTOMY;  Surgeon: Alyce Jubilee, MD;  Location: [facility];  Service: Endoscopy;;  cecal, transverse, descending, sigmoid   TEE WITHOUT CARDIOVERSION N/A 05/10/2021   Procedure: TRANSESOPHAGEAL ECHOCARDIOGRAM (TEE) WATCHMEN EVALUATION;  Surgeon: Loyde Rule, MD;  [facility] ENDOSCOPY;  Service: Cardiovascular;  Laterality: N/A;    SOCIAL HISTORY:  Social History   Socioeconomic History   Marital status: Single    Spouse name: Not on file   Number of children: Not on file   Years of education: Not on file   Highest education level: Not on file  Occupational History   Occupation: Retired  Tobacco Use   Smoking status: Former    Current packs/day: 0.00    Average packs/day: 1 pack/day for 33.6 years (33.6 ttl pk-yrs)    Types: Cigarettes    Start date: 03/16/1964    Quit date: 10/07/1997    Years since quitting: 26.6   Smokeless tobacco: Never  Vaping Use   Vaping status: Never Used  Substance and Sexual Activity   Alcohol use: No    Alcohol/week: 0.0 standard drinks of alcohol   Drug use: No   Sexual activity: Yes    Partners: Male  Other Topics Concern   Not on file  Social History Narrative   Not on file   Social Drivers of Health   Financial Resource Strain: Not on file  Food Insecurity: Not on file  Transportation Needs: Not on file  Physical Activity: Not on file  Stress: Not on file  Social Connections: Not on file  Intimate Partner Violence: Not on file    FAMILY HISTORY:  Family History  Problem  Relation Age of Onset   Stroke Mother    Dementia Father    Cancer - Other Sister    Atrial fibrillation Sister    Heart failure Sister    Colon cancer Maternal Grandmother        diagnosed in her 66s    CURRENT MEDICATIONS:  Outpatient Encounter Medications as of 05/25/2024  Medication Sig Note   acetaminophen  (TYLENOL ) 500 MG tablet Take 1,000 mg by mouth every 6 (six) hours as needed for moderate pain.    albuterol  (VENTOLIN  HFA) 108 (90 Base) MCG/ACT inhaler Inhale 2 puffs into the lungs every 6 (six) hours as needed for wheezing or shortness of breath.    calcitRIOL  (ROCALTROL ) 0.25 MCG capsule  Take 0.25 mcg by mouth 3 (three) times a week. Monday,Wednesday and Fridays    Cholecalciferol (VITAMIN D -3) 25 MCG (1000 UT) CAPS Take 1,000 Units by mouth daily.    Colchicine  0.6 MG CAPS Take by mouth as needed. 07/09/2023: PRN   cyclobenzaprine (FLEXERIL) 5 MG tablet Take 5 mg by mouth 3 (three) times daily. 06/18/2023: As needed per patient.    diclofenac Sodium (VOLTAREN) 1 % GEL Apply 1 application topically 4 (four) times daily as needed (pain).    ELIQUIS  5 MG TABS tablet TAKE 1 TABLET BY MOUTH TWICE A DAY    flecainide  (TAMBOCOR ) 50 MG tablet TAKE 1 TABLET BY MOUTH TWICE A DAY    furosemide  (LASIX ) 20 MG tablet Take 20 mg by mouth daily. In the afternoon In addition to her 40 mg once per day.    furosemide  (LASIX ) 40 MG tablet TAKE 1 TABLET BY MOUTH EVERY DAY    gabapentin  (NEURONTIN ) 100 MG capsule Take 1 capsule (100 mg total) by mouth 3 (three) times daily. 06/18/2023: prn   hydrocortisone  (ANUSOL -HC) 2.5 % rectal cream Insert rectally twice a day    losartan  (COZAAR ) 50 MG tablet TAKE 1 TABLET BY MOUTH EVERY DAY    lubiprostone  (AMITIZA ) 24 MCG capsule Take 1 capsule (24 mcg total) by mouth 2 (two) times daily with a meal.    octreotide  (SANDOSTATIN ) 100 MCG/ML SOLN injection INJECT 1 ML (100 MCG TOTAL) UNDER THE SKIN EVERY 12 (TWELVE) HOURS.    ondansetron  (ZOFRAN ) 4 MG tablet Take  1 tablet (4 mg total) by mouth every 6 (six) hours as needed for nausea.    pantoprazole  (PROTONIX ) 40 MG tablet Take 1 tablet (40 mg total) by mouth daily.    polyethylene glycol (MIRALAX  / GLYCOLAX ) 17 g packet Take 17 g by mouth daily as needed for mild constipation.    potassium chloride  SA (KLOR-CON ) 20 MEQ tablet Take 20 mEq by mouth in the morning, at noon, and at bedtime.    predniSONE  (DELTASONE ) 10 MG tablet Take 1 tablet (10 mg total) by mouth 3 (three) times daily. 06/18/2023: As needed.    rosuvastatin  (CRESTOR ) 10 MG tablet TAKE 1 TABLET BY MOUTH EVERY DAY    tiZANidine  (ZANAFLEX ) 4 MG tablet Take 1 tablet (4 mg total) by mouth every 6 (six) hours as needed for muscle spasms. 07/09/2023: AS NEEDED   vitamin C (ASCORBIC ACID) 250 MG tablet Take 250 mg by mouth daily.    No facility-administered encounter medications on file as of 05/25/2024.    ALLERGIES:  Allergies  Allergen Reactions   Acetaminophen -Codeine Hives, Itching and Rash   Amlodipine     Felt she retained fluid in her chest    Amoxicillin Hives    Did it involve swelling of the face/tongue/throat, SOB, or low BP? No Did it involve sudden or severe rash/hives, skin peeling, or any reaction on the inside of your mouth or nose? No Did you need to seek medical attention at a hospital or doctor's office? No When did it last happen?      10 + years If all above answers are "NO", may proceed with cephalosporin use.    Doxycycline      " sick and weak"   Allopurinol Hives and Rash   Tramadol Hives and Rash    LABORATORY DATA:  I have reviewed the labs as listed.  CBC    Component Value Date/Time   WBC 8.3 05/25/2024 0851   RBC 4.09 05/25/2024 0851  HGB 11.5 (L) 05/25/2024 0851   HCT 38.6 05/25/2024 0851   PLT 287 05/25/2024 0851   MCV 94.4 05/25/2024 0851   MCH 28.1 05/25/2024 0851   MCHC 29.8 (L) 05/25/2024 0851   RDW 15.7 (H) 05/25/2024 0851   LYMPHSABS 1.6 05/25/2024 0851   MONOABS 0.6 05/25/2024 0851    EOSABS 0.0 05/25/2024 0851   BASOSABS 0.0 05/25/2024 0851      Latest Ref Rng & Units 05/25/2024    8:51 AM 03/02/2024    9:01 AM 02/03/2024   10:54 AM  CMP  Glucose 70 - 99 mg/dL 657  99  846   BUN 8 - 23 mg/dL 30  22  17    Creatinine 0.44 - 1.00 mg/dL 9.62  9.52  8.41   Sodium 135 - 145 mmol/L 139  138  137   Potassium 3.5 - 5.1 mmol/L 4.9  4.8  3.9   Chloride 98 - 111 mmol/L 104  100  101   CO2 22 - 32 mmol/L 26  29  27    Calcium  8.9 - 10.3 mg/dL 9.1  9.2  8.9   Total Protein 6.5 - 8.1 g/dL 7.1  7.3  6.9   Total Bilirubin 0.0 - 1.2 mg/dL 0.6  0.5  0.5   Alkaline Phos 38 - 126 U/L 65  65  56   AST 15 - 41 U/L 29  23  22    ALT 0 - 44 U/L 16  11  13      DIAGNOSTIC IMAGING:  I have independently reviewed the relevant imaging and discussed with the patient.   WRAP UP:  All questions were answered. The patient knows to call the clinic with any problems, questions or concerns.  Medical decision making: Moderate  Time spent on visit: I spent 20 minutes counseling the patient face to face. The total time spent in the appointment was 30 minutes and more than 50% was on counseling.  Sonnie Dusky, PA-C  05/25/24 11:50 AM

## 2024-05-25 ENCOUNTER — Inpatient Hospital Stay: Attending: Hematology | Admitting: Physician Assistant

## 2024-05-25 ENCOUNTER — Encounter: Payer: Self-pay | Admitting: Physician Assistant

## 2024-05-25 ENCOUNTER — Inpatient Hospital Stay

## 2024-05-25 VITALS — BP 133/70 | HR 55 | Temp 97.8°F | Resp 16

## 2024-05-25 DIAGNOSIS — D500 Iron deficiency anemia secondary to blood loss (chronic): Secondary | ICD-10-CM | POA: Diagnosis not present

## 2024-05-25 DIAGNOSIS — D631 Anemia in chronic kidney disease: Secondary | ICD-10-CM | POA: Diagnosis not present

## 2024-05-25 DIAGNOSIS — E538 Deficiency of other specified B group vitamins: Secondary | ICD-10-CM

## 2024-05-25 DIAGNOSIS — N184 Chronic kidney disease, stage 4 (severe): Secondary | ICD-10-CM

## 2024-05-25 DIAGNOSIS — D509 Iron deficiency anemia, unspecified: Secondary | ICD-10-CM | POA: Insufficient documentation

## 2024-05-25 LAB — CBC WITH DIFFERENTIAL/PLATELET
Abs Immature Granulocytes: 0.02 10*3/uL (ref 0.00–0.07)
Basophils Absolute: 0 10*3/uL (ref 0.0–0.1)
Basophils Relative: 0 %
Eosinophils Absolute: 0 10*3/uL (ref 0.0–0.5)
Eosinophils Relative: 0 %
HCT: 38.6 % (ref 36.0–46.0)
Hemoglobin: 11.5 g/dL — ABNORMAL LOW (ref 12.0–15.0)
Immature Granulocytes: 0 %
Lymphocytes Relative: 20 %
Lymphs Abs: 1.6 10*3/uL (ref 0.7–4.0)
MCH: 28.1 pg (ref 26.0–34.0)
MCHC: 29.8 g/dL — ABNORMAL LOW (ref 30.0–36.0)
MCV: 94.4 fL (ref 80.0–100.0)
Monocytes Absolute: 0.6 10*3/uL (ref 0.1–1.0)
Monocytes Relative: 8 %
Neutro Abs: 6 10*3/uL (ref 1.7–7.7)
Neutrophils Relative %: 72 %
Platelets: 287 10*3/uL (ref 150–400)
RBC: 4.09 MIL/uL (ref 3.87–5.11)
RDW: 15.7 % — ABNORMAL HIGH (ref 11.5–15.5)
WBC: 8.3 10*3/uL (ref 4.0–10.5)
nRBC: 0 % (ref 0.0–0.2)

## 2024-05-25 LAB — COMPREHENSIVE METABOLIC PANEL WITH GFR
ALT: 16 U/L (ref 0–44)
AST: 29 U/L (ref 15–41)
Albumin: 3.9 g/dL (ref 3.5–5.0)
Alkaline Phosphatase: 65 U/L (ref 38–126)
Anion gap: 9 (ref 5–15)
BUN: 30 mg/dL — ABNORMAL HIGH (ref 8–23)
CO2: 26 mmol/L (ref 22–32)
Calcium: 9.1 mg/dL (ref 8.9–10.3)
Chloride: 104 mmol/L (ref 98–111)
Creatinine, Ser: 1.64 mg/dL — ABNORMAL HIGH (ref 0.44–1.00)
GFR, Estimated: 32 mL/min — ABNORMAL LOW (ref >60)
Glucose, Bld: 118 mg/dL — ABNORMAL HIGH (ref 70–99)
Potassium: 4.9 mmol/L (ref 3.5–5.1)
Sodium: 139 mmol/L (ref 135–145)
Total Bilirubin: 0.6 mg/dL (ref 0.0–1.2)
Total Protein: 7.1 g/dL (ref 6.5–8.1)

## 2024-05-25 LAB — FERRITIN: Ferritin: 59 ng/mL (ref 11–307)

## 2024-05-25 LAB — IRON AND TIBC
Iron: 44 ug/dL (ref 28–170)
Saturation Ratios: 12 % (ref 10.4–31.8)
TIBC: 368 ug/dL (ref 250–450)
UIBC: 324 ug/dL

## 2024-05-25 NOTE — Patient Instructions (Signed)
 [facility] at [facility] Discharge Instructions  You were seen today by Sheril Dines PA-C for your anemia.  Your anemia is due to your chronic kidney disease and iron  deficiency from your history of GI bleeding.  Your iron  levels are pending.  If you need any IV iron  (once your lab results are back), we will call you to get you scheduled for that.  Otherwise, we will just recheck those levels in 3 months.    We will continue to treat your anemia of chronic kidney disease with Retacrit  injections as needed every 6 weeks.  We will recheck labs and see you for office visit in 3 months.  Continue to follow-up with Dr. Carrolyn Clan for your chronic kidney disease, and Dr. Sammi Crick & [facility] gastro[facility]ology for your chronic GI bleeding.   ** Thank you for trusting me with your healthcare!  I strive to provide all of my patients with quality care at each visit.  If you receive a survey for this visit, I would be so grateful to you for taking the time to provide feedback.  Thank you in advance!  ~ Rayaan Garguilo                   Dr. Paulett Boros   &   Sheril Dines, PA-C   - - - - - - - - - - - - - - - - - -     Thank you for choosing [facility] at [facility] to provide your oncology and hematology care.  To afford each patient quality time with our provider, please arrive at least 15 minutes before your scheduled appointment time.   If you have a lab appointment with the Cancer Center please come in thru the Main Entrance and check in at the main information desk.  You need to re-schedule your appointment should you arrive 10 or more minutes late.  We strive to give you quality time with our providers, and arriving late affects you and other patients whose appointments are after yours.  Also, if you no show three or more times for appointments you may be dismissed from the clinic at the providers discretion.     Again, thank you for choosing  [facility].  Our hope is that these requests will decrease the amount of time that you wait before being seen by our physicians.       _____________________________________________________________  Should you have questions after your visit to [facility], please contact our office at [phone] and follow the prompts.  Our office hours are 8:00 a.m. and 4:30 p.m. Monday - Friday.  Please note that voicemails left after 4:00 p.m. may not be returned until the following business day.  We are closed weekends and major holidays.  You do have access to a nurse 24-7, just call the main number to the clinic [phone] and do not press any options, hold on the line and a nurse will answer the phone.    For prescription refill requests, have your pharmacy contact our office and allow 72 hours.    Due to Covid, you will need to wear a mask upon entering the hospital. If you do not have a mask, a mask will be given to you at the Main Entrance upon arrival. For doctor visits, patients may have 1 support person age 78 or older with them. For treatment visits, patients can not have anyone with them due  to social distancing guidelines and our immunocompromised population.

## 2024-05-25 NOTE — Progress Notes (Signed)
 Per Anola Basques PA pt does not need Retacrit  shot today. All follow ups as scheduled.   Crystal Ellwood [employer]

## 2024-06-03 ENCOUNTER — Inpatient Hospital Stay

## 2024-06-03 VITALS — BP 140/58 | HR 66 | Temp 97.7°F | Resp 18

## 2024-06-03 DIAGNOSIS — D509 Iron deficiency anemia, unspecified: Secondary | ICD-10-CM | POA: Diagnosis not present

## 2024-06-03 DIAGNOSIS — N184 Chronic kidney disease, stage 4 (severe): Secondary | ICD-10-CM

## 2024-06-03 DIAGNOSIS — D500 Iron deficiency anemia secondary to blood loss (chronic): Secondary | ICD-10-CM

## 2024-06-03 MED ORDER — SODIUM CHLORIDE 0.9 % IV SOLN: INTRAVENOUS | Status: DC

## 2024-06-03 MED ORDER — SODIUM CHLORIDE 0.9 % IV SOLN
400.0000 mg | Freq: Once | INTRAVENOUS | Status: AC
  Administered 2024-06-03: 400 mg via INTRAVENOUS
  Filled 2024-06-03: qty 400

## 2024-06-03 NOTE — Progress Notes (Signed)
 Patient did not want to stay the post iron  infusion wait time.  Reviewed side effects and when to report to the ER with understanding verbalized.   Patient tolerated iron  infusion with no complaints voiced.  Peripheral IV site clean and dry with good blood return noted before and after infusion.  Band aid applied.  VSS with discharge and left in satisfactory condition with no s/s of distress noted.

## 2024-06-03 NOTE — Progress Notes (Signed)
 She took her tylenol  and zyrtec  at home today.

## 2024-06-03 NOTE — Patient Instructions (Signed)

## 2024-06-09 ENCOUNTER — Encounter: Attending: Gastroenterology | Admitting: *Deleted

## 2024-06-09 VITALS — BP 155/68 | HR 54 | Temp 97.8°F | Resp 18

## 2024-06-09 DIAGNOSIS — K5521 Angiodysplasia of colon with hemorrhage: Secondary | ICD-10-CM | POA: Diagnosis not present

## 2024-06-09 DIAGNOSIS — K5520 Angiodysplasia of colon without hemorrhage: Secondary | ICD-10-CM | POA: Insufficient documentation

## 2024-06-09 MED ORDER — OCTREOTIDE ACETATE 20 MG IM KIT
20.0000 mg | PACK | Freq: Once | INTRAMUSCULAR | Status: AC
  Administered 2024-06-09: 20 mg via INTRAMUSCULAR

## 2024-06-09 NOTE — Progress Notes (Signed)
 Diagnosis: AVM of small bowel  Provider:  Brooke Bonito NP  Procedure: Injection  Sandostatin, Dose: 20 mg, Site: intramuscular, Number of injections: 1  Injection Site(s): Right upper quad. gluteus  Post Care: Observation period completed  Discharge: Condition: Good, Destination: Home . AVS Provided  Performed by:  Daleen Squibb, RN

## 2024-06-10 ENCOUNTER — Inpatient Hospital Stay

## 2024-06-10 VITALS — BP 133/62 | HR 55 | Temp 96.7°F | Resp 18

## 2024-06-10 DIAGNOSIS — D631 Anemia in chronic kidney disease: Secondary | ICD-10-CM

## 2024-06-10 DIAGNOSIS — D509 Iron deficiency anemia, unspecified: Secondary | ICD-10-CM | POA: Diagnosis not present

## 2024-06-10 DIAGNOSIS — D500 Iron deficiency anemia secondary to blood loss (chronic): Secondary | ICD-10-CM

## 2024-06-10 MED ORDER — SODIUM CHLORIDE 0.9 % IV SOLN
400.0000 mg | Freq: Once | INTRAVENOUS | Status: AC
  Administered 2024-06-10: 400 mg via INTRAVENOUS
  Filled 2024-06-10: qty 400

## 2024-06-10 MED ORDER — ACETAMINOPHEN 325 MG PO TABS: 650.0000 mg | ORAL_TABLET | Freq: Once | ORAL | Status: DC

## 2024-06-10 MED ORDER — CETIRIZINE HCL 10 MG/ML IV SOLN: 5.0000 mg | Freq: Once | INTRAVENOUS | Status: DC

## 2024-06-10 MED ORDER — SODIUM CHLORIDE 0.9 % IV SOLN: INTRAVENOUS | Status: DC

## 2024-06-10 MED ORDER — CETIRIZINE HCL 10 MG PO TABS: 5.0000 mg | ORAL_TABLET | Freq: Once | ORAL | Status: DC

## 2024-06-10 NOTE — Progress Notes (Addendum)
 Patient presents today for iron  infusion.  Patient is in satisfactory condition with no new complaints voiced.  Vital signs are stable.  We will proceed with infusion per provider orders.    Peripheral IV started with good blood return pre and post infusion. Patient took pre-meds at home prior to arrival.  Venofer  400 mg  given today per MD orders. Tolerated infusion without adverse affects. Vital signs stable. No complaints at this time. Discharged from clinic ambulatory with walker in stable condition. Alert and oriented x 3. F/U with [facility] as scheduled.

## 2024-06-10 NOTE — Patient Instructions (Signed)
 [facility] [place] - A DEPT OF [facility]  Discharge Instructions: Thank you for choosing [facility] to provide your oncology and hematology care.  If you have a lab appointment with the Cancer Center - please note that after April 8th, 2024, all labs will be drawn in the cancer center.  You do not have to check in or register with the main entrance as you have in the past but will complete your check-in in the cancer center.  Wear comfortable clothing and clothing appropriate for easy access to any Portacath or PICC line.   We strive to give you quality time with your provider. You may need to reschedule your appointment if you arrive late (15 or more minutes).  Arriving late affects you and other patients whose appointments are after yours.  Also, if you miss three or more appointments without notifying the office, you may be dismissed from the clinic at the provider's discretion.      For prescription refill requests, have your pharmacy contact our office and allow 72 hours for refills to be completed.    Today you received Venofer 400 mg IV iron infusion.    BELOW ARE SYMPTOMS THAT SHOULD BE REPORTED IMMEDIATELY: *FEVER GREATER THAN 100.4 F (38 C) OR HIGHER *CHILLS OR SWEATING *NAUSEA AND VOMITING THAT IS NOT CONTROLLED WITH YOUR NAUSEA MEDICATION *UNUSUAL SHORTNESS OF BREATH *UNUSUAL BRUISING OR BLEEDING *URINARY PROBLEMS (pain or burning when urinating, or frequent urination) *BOWEL PROBLEMS (unusual diarrhea, constipation, pain near the anus) TENDERNESS IN MOUTH AND THROAT WITH OR WITHOUT PRESENCE OF ULCERS (sore throat, sores in mouth, or a toothache) UNUSUAL RASH, SWELLING OR PAIN  UNUSUAL VAGINAL DISCHARGE OR ITCHING   Items with * indicate a potential emergency and should be followed up as soon as possible or go to the Emergency Department if any problems should occur.  Please show the CHEMOTHERAPY ALERT CARD or IMMUNOTHERAPY ALERT CARD  at check-in to the Emergency Department and triage nurse.  Should you have questions after your visit or need to cancel or reschedule your appointment, please contact [facility] [place] - A DEPT OF Eligha Brideg[facility] [phone]  and follow the prompts.  Office hours are 8:00 a.m. to 4:30 p.m. Monday - Friday. Please note that voicemails left after 4:00 p.m. may not be returned until the following business day.  We are closed weekends and major holidays. You have access to a nurse at all times for urgent questions. Please call the main number to the clinic [phone] and follow the prompts.  For any non-urgent questions, you may also contact your provider using MyChart. We now offer e-Visits for anyone 37 and older to request care online for non-urgent symptoms. For details visit [email or web address].   Also download the MyChart app! Go to the app store, search "MyChart", open the app, select [place], and log in with your MyChart username and password.

## 2024-06-17 ENCOUNTER — Ambulatory Visit: Payer: Medicare HMO | Admitting: Gastroenterology

## 2024-06-17 ENCOUNTER — Encounter: Payer: Self-pay | Admitting: Gastroenterology

## 2024-06-17 VITALS — BP 128/73 | HR 58 | Temp 98.7°F | Ht 62.0 in | Wt 174.2 lb

## 2024-06-17 DIAGNOSIS — J449 Chronic obstructive pulmonary disease, unspecified: Secondary | ICD-10-CM

## 2024-06-17 DIAGNOSIS — D500 Iron deficiency anemia secondary to blood loss (chronic): Secondary | ICD-10-CM

## 2024-06-17 DIAGNOSIS — K649 Unspecified hemorrhoids: Secondary | ICD-10-CM

## 2024-06-17 DIAGNOSIS — Z8719 Personal history of other diseases of the digestive system: Secondary | ICD-10-CM

## 2024-06-17 DIAGNOSIS — K5521 Angiodysplasia of colon with hemorrhage: Secondary | ICD-10-CM

## 2024-06-17 DIAGNOSIS — K625 Hemorrhage of anus and rectum: Secondary | ICD-10-CM

## 2024-06-17 DIAGNOSIS — K5900 Constipation, unspecified: Secondary | ICD-10-CM

## 2024-06-17 DIAGNOSIS — K219 Gastro-esophageal reflux disease without esophagitis: Secondary | ICD-10-CM

## 2024-06-17 DIAGNOSIS — D509 Iron deficiency anemia, unspecified: Secondary | ICD-10-CM

## 2024-06-17 DIAGNOSIS — K6289 Other specified diseases of anus and rectum: Secondary | ICD-10-CM

## 2024-06-17 MED ORDER — HYDROCORTISONE (PERIANAL) 2.5 % EX CREA: TOPICAL_CREAM | Freq: Two times a day (BID) | CUTANEOUS | 1 refills | Status: AC | PRN

## 2024-06-17 MED ORDER — LUBIPROSTONE 24 MCG PO CAPS: 24.0000 ug | ORAL_CAPSULE | Freq: Two times a day (BID) | ORAL | 3 refills | Status: AC

## 2024-06-17 MED ORDER — ALBUTEROL SULFATE HFA 108 (90 BASE) MCG/ACT IN AERS
2.0000 | INHALATION_SPRAY | Freq: Four times a day (QID) | RESPIRATORY_TRACT | 0 refills | Status: AC | PRN
Start: 2024-06-17 — End: ?

## 2024-06-17 NOTE — Patient Instructions (Addendum)
 No changes to plan today.   Continue monthly octreotide  injections and following regularly with hematology.  For heartburn episodes you may take Tums as needed, please do not take more than a few times a week.  If you are having more regular heartburn symptoms it is okay for you to take famotidine (Pepcid) 10 or 20 mg once a day as needed.  Follow a GERD diet:  Avoid fried, fatty, greasy, spicy, citrus foods. Avoid caffeine and carbonated beverages. Avoid chocolate. Try eating 4-6 small meals a day rather than 3 large meals. Do not eat within 3 hours of laying down. Prop head of bed up on wood or bricks to create a 6 inch incline.  With long-term use of octreotide , we need to continue to monitor your gallbladder so I would recommend an ultrasound within the next 3-6 months.  We can send a letter at that time.  Office visit in 1 year, sooner if needed.  It was great to see you again today!  I want to create trusting relationships with patients. If you receive a survey regarding your visit,  I greatly appreciate you taking time to fill this out on paper or through your MyChart. I value your feedback.  Charmaine Melia, MSN, FNP-BC, AGACNP-BC [facility]

## 2024-06-17 NOTE — Progress Notes (Signed)
 GI Office Note    Referring Provider: Rogers Hai, MD Primary Care Physician:  [name] Hai, MD Primary Gastroenterologist: [name] POUR. Cindie, DO  Date:  06/17/2024  ID:  [name], [name] 02/28/1946, MRN [id number]   Chief Complaint   Chief Complaint  Patient presents with   Follow-up    One year follow up. Pt states she has been doing good except her hemorrhoids flared up because she had been out of Amitiza  (we didn't know)   History of Present Illness  [name] [name] is a 78 y.o. 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   Colonoscopy March 2021: - Nine 3 to 7 mm polyps in the sigmoid colon( 4) , in the mid descending colon, in the mid transverse colon and in the cecum( 3) , removed with a cold snare. Resected and retrieved.  - Nodular mucosa at the anus. Biopsied.  - Tortuous LEFT colon. - Path with benign, tubular adenomas.   EGD in December 2021 which was relatively unremarkable and has subsequent small bowel capsule endoscopy revealing multiple small bowel AVMs and underwent enteroscopy with APC of multiple AVMs on 11/27/20.    April 2022 she again had worsening anemia and presented to the hospital for blood transfusion and was referred to [name] [name] and underwent double-balloon enteroscopy on 05/19/2021 where she had 16 angioectasias in the duodenum, 6 angiectasia's in the proximal jejunum, and 5 angiectasias mid to distal jejunum all of which were ablated with APC.     Hospitalization 06/21/22-06/23/22.  She presented with weakness and worsening dyspnea on exertion and was found to have a hemoglobin of 7.8.  Iron  panel with iron  21, TIBC 441, saturation 5%, and ferritin 12.  She was given 1 unit of PRBC.  She reportedly had been following with hematology and was going to discuss IV iron  infusions.  She regularly has her hemoglobin checked every 2 weeks.  She was seen in  consult for her anemia and history of small bowel AVMs by our team.  She mentioned intermittent blood in her stools due to hemorrhoids and last noted melena about a month prior to presentation.  Given recurrent anemia and possible bleeding the decision was made to refer her back to [name] [name] for double-balloon enteroscopy evaluation/intervention.  Patient agreed to this and was treated conservatively with blood transfusion/iron  infusion.  She was advised to continue octreotide  injections.   Seen by hematology on 07/02/2022.  She reportedly was still symptomatic with fatigue, ice pica, lightheadedness, palpitations, and some dyspnea on exertion.  She failed to improve with oral iron  supplementation therefore it was discontinued and the plan was for IV Venofer  400 mg for 2 doses and Retacrit  10,000 units every 2 weeks starting on 07/12/2022.  Other lab work was ordered including copper , folate, homocystine, B12, reticulocytes, methylmalonic acid, LDH, immunofixation, free light chains, and SPEP.  She was to repeat CBC and iron  panel and follow-up in 2 months.   Labs 07/02/2022: Hemoglobin 9.7, MCV 92.2, folate 18.9, LDH 140, reticulocyte percentage 3.7, absolute retake count 132.8, immature retake fractionation 29.9%, copper  normal at 107, methylmalonic acid normal at 255, B12 505.   Last seen in the office 07/30/22. No reports of melena or hematochezia. Energy had improved, recently received iron  infusions. Denied N/V, reflux, dysphagia, worsening SOB. Having hgb checked every 2 weeks, Due to see [name] [name] in 1 month. Using anusol  intermittently for perineal discomfort.  Constipation well controlled with Amitiza  24mcg twice daily. Advised to use zinc based cream to perineum. Continue octreotide  and regular follow up with hematology.    Small bowel enteroscopy performed by [name] [name] 10/08/2022: -3 medium angioectasias, along with bleeding found in the gastric body s/p APC -40 angiectasias, several bleeding found in the entire  duodenum s/p APC -21 angiectasias, several bleeding in proximal jejunum s/p APC -Recommended to continue monitoring H/H and iron  stores, continue octreotide .   OV 12/03/22.  Amitiza  working well to control constipation.  Having soft stools and no need to straining.  Now she is able to eat regular foods that she likes without issue.  She is continuing to follow with hematology and having labs once per month.  Energy is improved.  Denies any melena or BRBPR, abdominal pain, pica.  No weight loss and appetite is much improved.  Working on gaining weight.  She was advised to continue octreotide  100 mcg subcutaneous twice daily and her Amitiza  twice daily as well as PPI once daily.  Continue to follow with hematology.  Last office visit 06/18/23.  Receiving Retacrit  injections every 6 weeks with hematology.  Also receiving IV iron  infusion just prior to this visit.  Has regular blood work with hematology.  Continues to do octreotide  twice daily.  Appetite varies but overall eating less.  No significant weight loss.  Taking Amitiza  twice daily any nausea.  Having good bowel movements.  Still having some occasional shortness of breath well overall.  Reflux well-controlled.  No medication changes made.  Refilled pantoprazole .  Appeal form sent into insurance earlier this year for necessity for octreotide  injection twice daily.  GI with [facility] who performed her prior double-balloon enteroscopy recommended discussing monthly injection of octreotide  with hematology.  She was switched to octreotide  monthly injections to be done at [facility] infusion center in March 2025.     Latest Ref Rng & Units 05/25/2024    8:51 AM 04/13/2024    8:53 AM 03/02/2024    9:01 AM  CBC  WBC 4.0 - 10.5 K/uL 8.3  4.3  4.5   Hemoglobin 12.0 - 15.0 g/dL 88.4  88.8  89.2   Hematocrit 36.0 - 46.0 % 38.6  37.2  36.3   Platelets 150 - 400 K/uL 287  250  276    Iron /TIBC/Ferritin/ %Sat    Component Value Date/Time   IRON  44  05/25/2024 0852   TIBC 368 05/25/2024 0852   FERRITIN 59 05/25/2024 0852   IRONPCTSAT 12 05/25/2024 0852    Today:  Current treatment: Octreotide  monthly injections Last IV iron  infusion: Venofer  on 6/18 Last follow-up with hematology: 05/25/2024 (plan for Venofer  x 2, possible Retacrit  injections every 6 weeks, continue octreotide  monthly) Last dose of Retacrit  March 2025  Hgb has not been below 11 since starting the monthly injections.   Has occasional pain in her stomach - no rhyme or reason.   Was without her Amtiiza for a month and that constipation and then aggravated her hemorhoids. Hemorrhoids were bleeding bit not any more - they are more swollen and painful nd better now that she is back on her Amitiza  for the ast month or so. She is using the cream prescribed last time. The lidocaine  and preparation H is helping and that has helped. Right now not having pain. Has been back tacking the Amitiza  twice daily.   Appetite is pretty good. Blood sugars have been good.   Has been having reflux symptoms as of recently. Previous  provider took her off of her pantoprazole  for unknown reason.  Wt Readings from Last 3 Encounters:  06/17/24 174 lb 3.2 oz (79 kg)  03/02/24 172 lb 13.5 oz (78.4 kg)  11/28/23 177 lb 4.8 oz (80.4 kg)    Current Outpatient Medications  Medication Sig Dispense Refill   vitamin C (ASCORBIC ACID) 250 MG tablet Take 250 mg by mouth daily.     acetaminophen  (TYLENOL ) 500 MG tablet Take 1,000 mg by mouth every 6 (six) hours as needed for moderate pain.     albuterol  (VENTOLIN  HFA) 108 (90 Base) MCG/ACT inhaler Inhale 2 puffs into the lungs every 6 (six) hours as needed for wheezing or shortness of breath. 18 g 1   calcitRIOL  (ROCALTROL ) 0.25 MCG capsule Take 0.25 mcg by mouth 3 (three) times a week. Monday,Wednesday and Fridays     Cholecalciferol (VITAMIN D -3) 25 MCG (1000 UT) CAPS Take 1,000 Units by mouth daily.     Colchicine  0.6 MG CAPS Take by mouth as  needed.     cyclobenzaprine (FLEXERIL) 5 MG tablet Take 5 mg by mouth 3 (three) times daily.     diclofenac Sodium (VOLTAREN) 1 % GEL Apply 1 application topically 4 (four) times daily as needed (pain).     ELIQUIS  5 MG TABS tablet TAKE 1 TABLET BY MOUTH TWICE A DAY 180 tablet 3   flecainide  (TAMBOCOR ) 50 MG tablet TAKE 1 TABLET BY MOUTH TWICE A DAY 180 tablet 1   furosemide  (LASIX ) 20 MG tablet Take 20 mg by mouth daily. In the afternoon In addition to her 40 mg once per day.     furosemide  (LASIX ) 40 MG tablet TAKE 1 TABLET BY MOUTH EVERY DAY 90 tablet 2   gabapentin  (NEURONTIN ) 100 MG capsule Take 1 capsule (100 mg total) by mouth 3 (three) times daily. 90 capsule 2   hydrocortisone  (ANUSOL -HC) 2.5 % rectal cream Insert rectally twice a day 30 g 1   losartan  (COZAAR ) 50 MG tablet TAKE 1 TABLET BY MOUTH EVERY DAY 90 tablet 3   lubiprostone  (AMITIZA ) 24 MCG capsule Take 1 capsule (24 mcg total) by mouth 2 (two) times daily with a meal. 180 capsule 3   octreotide  (SANDOSTATIN ) 100 MCG/ML SOLN injection INJECT 1 ML (100 MCG TOTAL) UNDER THE SKIN EVERY 12 (TWELVE) HOURS. 60 mL 11   ondansetron  (ZOFRAN ) 4 MG tablet Take 1 tablet (4 mg total) by mouth every 6 (six) hours as needed for nausea. 20 tablet 0   pantoprazole  (PROTONIX ) 40 MG tablet Take 1 tablet (40 mg total) by mouth daily. 90 tablet 3   polyethylene glycol (MIRALAX  / GLYCOLAX ) 17 g packet Take 17 g by mouth daily as needed for mild constipation. 14 each 0   potassium chloride  SA (KLOR-CON ) 20 MEQ tablet Take 20 mEq by mouth in the morning, at noon, and at bedtime.     predniSONE  (DELTASONE ) 10 MG tablet Take 1 tablet (10 mg total) by mouth 3 (three) times daily. 42 tablet 0   rosuvastatin  (CRESTOR ) 10 MG tablet TAKE 1 TABLET BY MOUTH EVERY DAY 30 tablet 0   tiZANidine  (ZANAFLEX ) 4 MG tablet Take 1 tablet (4 mg total) by mouth every 6 (six) hours as needed for muscle spasms. 30 tablet 0   No current facility-administered medications for  this visit.    Past [name] History:  Diagnosis Date   Anemia    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HCC) 09/11/2022   Chronic kidney disease  Constipation 07/17/2017   GERD (gastroesophageal reflux disease)    Gout    Grade II diastolic dysfunction 11/23/2020   Hyperlipidemia    Hypertension    Hypokalemia    OSA (obstructive sleep apnea) 08/09/2020   PAF (paroxysmal atrial fibrillation) (HCC)    PAF (paroxysmal atrial fibrillation) (HCC)    PUD (peptic ulcer disease)    Syncope    Neurocardiogenic    Past Surgical History:  Procedure Laterality Date   ABDOMINAL AORTOGRAM W/LOWER EXTREMITY Right 12/13/2020   Procedure: ABDOMINAL AORTOGRAM W/LOWER EXTREMITY;  Surgeon: Serene Gaile ORN, MD;  Location: [facility];  Service: Cardiovascular;  Laterality: Right;   ABDOMINAL HYSTERECTOMY     AMPUTATION Right 12/14/2020   Procedure: Right fifth toe amputation;  Surgeon: Oris Krystal FALCON, MD;  Location: [name] OR;  Service: Vascular;  Laterality: Right;   CATARACT EXTRACTION Bilateral    COLONOSCOPY N/A 10/07/2014   Dr. Harvey: redundant left colon, moderate sized external hemorrhoids    COLONOSCOPY N/A 03/11/2020   Surgeon: Harvey Margo CROME, MD;  9 polyps, majority of which were tubular adenomas, nodular mucosa in the anus s/p biopsy which was benign.   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    HOT HEMOSTASIS  11/27/2020   Procedure: HOT HEMOSTASIS (ARGON PLASMA COAGULATION/BICAP);  Surgeon: Eartha Flavors, Toribio, MD;  Location: [facility];  Service: Gastroenterology;;   LIPOMA RESECTION     PERIPHERAL VASCULAR INTERVENTION Right 12/13/2020   Procedure: PERIPHERAL VASCULAR INTERVENTION;  Surgeon: Serene Gaile ORN, MD;  Location: [facility];  Service: Cardiovascular;  Laterality: Right;  SFA/PT/AT   POLYPECTOMY  03/11/2020   Procedure: POLYPECTOMY;  Surgeon: Harvey Margo CROME, MD;  Location: [facility];  Service: Endoscopy;;  cecal, transverse, descending, sigmoid   TEE WITHOUT CARDIOVERSION N/A 05/10/2021   Procedure: TRANSESOPHAGEAL ECHOCARDIOGRAM (TEE) WATCHMEN EVALUATION;  Surgeon: Delford Maude BROCKS, MD;  Location: [facility] ENDOSCOPY;  Service: Cardiovascular;  Laterality: N/A;    Family History  Problem Relation Age of Onset   Stroke Mother    Dementia Father    Cancer - Other Sister    Atrial fibrillation Sister    Heart failure Sister    Colon cancer Maternal Grandmother        diagnosed in her 12s    Allergies as of 06/17/2024 - Review Complete 06/17/2024  Allergen Reaction Noted   Acetaminophen -codeine Hives, Itching, and Rash 09/16/2020   Amlodipine  08/03/2020   Amoxicillin Hives 03/04/2020   Doxycycline   11/23/2020   Allopurinol Hives and Rash    Tramadol Hives and Rash     Social History   Socioeconomic History   Marital status: Single    Spouse name: Not on file   Number of children: Not on file   Years of education: Not on file   Highest education level: Not on file  Occupational History   Occupation: Retired  Tobacco Use   Smoking status: Former    Current packs/day: 0.00    Average packs/day: 1 pack/day for 33.6 years (33.6 ttl pk-yrs)  Types: Cigarettes    Start date: 03/16/1964    Quit date: 10/07/1997    Years since quitting: 26.7   Smokeless tobacco: Never  Vaping Use   Vaping status: Never Used  Substance and Sexual Activity   Alcohol use: No    Alcohol/week: 0.0 standard drinks of alcohol   Drug use: No   Sexual activity: Yes    Partners: Male  Other Topics Concern    Not on file  Social History Narrative   Not on file   Social Drivers of Health   Financial Resource Strain: Not on file  Food Insecurity: Not on file  Transportation Needs: Not on file  Physical Activity: Not on file  Stress: Not on file  Social Connections: Not on file     Review of Systems   Gen: + fatigue. Denies fever, chills, anorexia. Denies weakness, weight loss.  CV: Denies chest pain, palpitations, syncope, peripheral edema, and claudication. Resp: + DOE and rest Denies cough, wheezing, coughing up blood, and pleurisy. GI: See HPI Derm: Denies rash, itching, dry skin Psych: Denies depression, anxiety, memory loss, confusion. No homicidal or suicidal ideation.  Heme: Denies bruising, bleeding, and enlarged lymph nodes. Neuro: + headaches, numbness + arthralgia  Physical Exam   BP 128/73   Pulse (!) 58   Temp 98.7 F (37.1 C)   Ht 5' 2 (1.575 m)   Wt 174 lb 3.2 oz (79 kg)   BMI 31.86 kg/m   General:   Alert and oriented. No distress noted. Pleasant and cooperative.  Head:  Normocephalic and atraumatic. Eyes:  Conjuctiva clear without scleral icterus. Mouth:  UTA, mask in place. Abdomen:  +BS, soft, non-tender and non-distended. No rebound or guarding. No HSM or masses noted. Rectal: deferred Msk:  Symmetrical without gross deformities. Normal posture. Extremities:  Without edema. Neurologic:  Alert and  oriented x4 Psych:  Alert and cooperative. Normal mood and affect.  Assessment  ARLI BREE is a 78 y.o. female presenting today for follow-up of IDA and small bowel AVMs.  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 -Follows with hematology regularly, receives Retacrit  injections every 6 weeks as needed and IV iron  infusions as needed  given prior intolerance to oral iron . -In March of this year she was transition from subcutaneous octreotide  twice daily to monthly IM octreotide  and is doing well -Most recent labs with hemoglobin stable in the 11 range, last IV iron  infusions 6/3. -Currently doing well without any rectal bleeding or melena.  Prior rectal bleeding occurred in the setting of hemorrhoids 2/2 constipation - Given dose of octreotide , need to ensure ongoing surveillance of biliary system.  Given no recent abdominal imaging will plan for MASLD in 3-6 months.  Most recent LFTs within normal limits.  Constipaton: Well-controlled with Amitiza  twice daily.  Stools are much better control as of recently struggled with constipation while she was out of her Amitiza  for over a month.  Was previously having some intermittent abdominal pain, not occurring at this time.  GERD: Previously well-controlled with pantoprazole  daily.  Reports this was stopped by a prior provider for unknown reason and she was doing well up until the last couple weeks and she has been having intermittent symptoms.  For now advised her to help sparingly as needed/or  use of famotidine.  GERD diet reinforced  Hemorrhoids: Had flare during most recent bout of constipation.  Was having some rectal pain as well as bleeding.  Not currently having any pain or bleeding.  Has been using Preparation H with lidocaine /or as needed.  Refill of Anusol  today.  Given some issues with her COPD and some shortness of breath recently, she is out of her inhaler and awaiting to talk with PCP regarding this.  Given that heat and risk for increased shortness of breath I sent a single refill of her albuterol  inhaler for her to use as needed.  She will talk to her PCP again for further refills of her albuterol .  PLAN   Recall for TCS in March 2026 Continue monthly octreotide  Continue regular follow up with hematology.  Tums sparingly as needed or famotidine as needed for  reflux. GERD diet Consider ultrasound in 3-6 months to assess for biliary disorders given chronic octreotide  therapy. Anusol  twice daily as needed for hemorrhoids.  Refill sent. Amitiza  24 mcg twice daily.  Follow-up in 1 year, sooner if needed.    Charmaine Melia, MSN, FNP-BC, AGACNP-BC [facility]

## 2024-06-18 ENCOUNTER — Other Ambulatory Visit: Payer: Self-pay | Admitting: Gastroenterology

## 2024-07-02 ENCOUNTER — Telehealth: Payer: Self-pay | Admitting: Orthopedic Surgery

## 2024-07-02 NOTE — Telephone Encounter (Signed)
 Dr. Areatha pt - pt lvm asking if she needed a script for a back brace, if so, she'd like one. [phone]

## 2024-07-06 ENCOUNTER — Inpatient Hospital Stay: Attending: Hematology

## 2024-07-06 ENCOUNTER — Inpatient Hospital Stay

## 2024-07-06 DIAGNOSIS — D631 Anemia in chronic kidney disease: Secondary | ICD-10-CM | POA: Insufficient documentation

## 2024-07-06 DIAGNOSIS — I129 Hypertensive chronic kidney disease with stage 1 through stage 4 chronic kidney disease, or unspecified chronic kidney disease: Secondary | ICD-10-CM | POA: Diagnosis present

## 2024-07-06 DIAGNOSIS — N184 Chronic kidney disease, stage 4 (severe): Secondary | ICD-10-CM | POA: Insufficient documentation

## 2024-07-06 LAB — CBC
HCT: 38.2 % (ref 36.0–46.0)
Hemoglobin: 11.6 g/dL — ABNORMAL LOW (ref 12.0–15.0)
MCH: 29.3 pg (ref 26.0–34.0)
MCHC: 30.4 g/dL (ref 30.0–36.0)
MCV: 96.5 fL (ref 80.0–100.0)
Platelets: 316 K/uL (ref 150–400)
RBC: 3.96 MIL/uL (ref 3.87–5.11)
RDW: 14.9 % (ref 11.5–15.5)
WBC: 7.5 K/uL (ref 4.0–10.5)
nRBC: 0 % (ref 0.0–0.2)

## 2024-07-06 NOTE — Progress Notes (Signed)
 Patient presents for possible retacrit  injection. Patient's Hgb 11.6. Patient does not require injection today, made aware and verbalized understanding.

## 2024-07-07 ENCOUNTER — Encounter: Attending: Gastroenterology | Admitting: Emergency Medicine

## 2024-07-07 VITALS — BP 173/75 | HR 54 | Temp 97.7°F

## 2024-07-07 DIAGNOSIS — K5521 Angiodysplasia of colon with hemorrhage: Secondary | ICD-10-CM | POA: Diagnosis not present

## 2024-07-07 DIAGNOSIS — K5520 Angiodysplasia of colon without hemorrhage: Secondary | ICD-10-CM | POA: Insufficient documentation

## 2024-07-07 MED ORDER — OCTREOTIDE ACETATE 20 MG IM KIT
20.0000 mg | PACK | Freq: Once | INTRAMUSCULAR | Status: AC
Start: 2024-07-07 — End: 2024-07-07
  Administered 2024-07-07: 20 mg via INTRAMUSCULAR

## 2024-07-07 NOTE — Progress Notes (Addendum)
 Diagnosis: AVM of small bowel  Provider:  Carlin POUR. Cindie, DO   Procedure: Injection  Sandostatin , Dose: 20 mg, Site: intramuscular, Number of injections: 1  Injection Site(s): Left upper quad. gluteus  Post Care: Observation period completed  Discharge: Condition: Good, Destination: Home . AVS Provided  Performed by:  Vernell JINNY Fitting, RN

## 2024-08-04 ENCOUNTER — Encounter: Attending: Gastroenterology | Admitting: *Deleted

## 2024-08-04 VITALS — BP 149/71 | HR 57 | Temp 98.6°F | Resp 16

## 2024-08-04 DIAGNOSIS — K5521 Angiodysplasia of colon with hemorrhage: Secondary | ICD-10-CM | POA: Insufficient documentation

## 2024-08-04 DIAGNOSIS — K5520 Angiodysplasia of colon without hemorrhage: Secondary | ICD-10-CM | POA: Insufficient documentation

## 2024-08-04 MED ORDER — OCTREOTIDE ACETATE 20 MG IM KIT
20.0000 mg | PACK | Freq: Once | INTRAMUSCULAR | Status: AC
  Administered 2024-08-04 (×2): 20 mg via INTRAMUSCULAR

## 2024-08-04 NOTE — Progress Notes (Signed)
 Diagnosis: AVM of small bowel  Provider:  Kennedy Pfeiffer NP  Procedure: Injection  Sandostatin, Dose: 20 mg, Site: intramuscular, Number of injections: 1  Injection Site(s): Right upper quad. gluteus  Post Care: Observation period completed  Discharge: Condition: Good, Destination: Home . AVS Provided  Performed by:  Baldwin Darice Helling, RN

## 2024-08-12 ENCOUNTER — Other Ambulatory Visit: Payer: Self-pay | Admitting: Internal Medicine

## 2024-08-12 ENCOUNTER — Other Ambulatory Visit: Payer: Self-pay | Admitting: Gastroenterology

## 2024-08-12 DIAGNOSIS — J449 Chronic obstructive pulmonary disease, unspecified: Secondary | ICD-10-CM

## 2024-08-14 NOTE — Telephone Encounter (Signed)
 noted

## 2024-08-19 ENCOUNTER — Inpatient Hospital Stay: Attending: Hematology | Admitting: Oncology

## 2024-08-19 ENCOUNTER — Inpatient Hospital Stay

## 2024-08-19 VITALS — BP 179/64 | HR 59 | Temp 98.2°F | Resp 19 | Wt 176.0 lb

## 2024-08-19 DIAGNOSIS — I4891 Unspecified atrial fibrillation: Secondary | ICD-10-CM | POA: Diagnosis not present

## 2024-08-19 DIAGNOSIS — D631 Anemia in chronic kidney disease: Secondary | ICD-10-CM

## 2024-08-19 DIAGNOSIS — N184 Chronic kidney disease, stage 4 (severe): Secondary | ICD-10-CM | POA: Insufficient documentation

## 2024-08-19 DIAGNOSIS — Z7901 Long term (current) use of anticoagulants: Secondary | ICD-10-CM | POA: Diagnosis not present

## 2024-08-19 DIAGNOSIS — D500 Iron deficiency anemia secondary to blood loss (chronic): Secondary | ICD-10-CM

## 2024-08-19 DIAGNOSIS — R768 Other specified abnormal immunological findings in serum: Secondary | ICD-10-CM | POA: Diagnosis not present

## 2024-08-19 DIAGNOSIS — E538 Deficiency of other specified B group vitamins: Secondary | ICD-10-CM

## 2024-08-19 DIAGNOSIS — D62 Acute posthemorrhagic anemia: Secondary | ICD-10-CM

## 2024-08-19 LAB — COMPREHENSIVE METABOLIC PANEL WITH GFR
ALT: 24 U/L (ref 0–44)
AST: 28 U/L (ref 15–41)
Albumin: 3.8 g/dL (ref 3.5–5.0)
Alkaline Phosphatase: 65 U/L (ref 38–126)
Anion gap: 11 (ref 5–15)
BUN: 22 mg/dL (ref 8–23)
CO2: 29 mmol/L (ref 22–32)
Calcium: 8.6 mg/dL — ABNORMAL LOW (ref 8.9–10.3)
Chloride: 99 mmol/L (ref 98–111)
Creatinine, Ser: 1.58 mg/dL — ABNORMAL HIGH (ref 0.44–1.00)
GFR, Estimated: 33 mL/min — ABNORMAL LOW (ref >60)
Glucose, Bld: 112 mg/dL — ABNORMAL HIGH (ref 70–99)
Potassium: 3.5 mmol/L (ref 3.5–5.1)
Sodium: 139 mmol/L (ref 135–145)
Total Bilirubin: 0.6 mg/dL (ref 0.0–1.2)
Total Protein: 6.7 g/dL (ref 6.5–8.1)

## 2024-08-19 LAB — CBC WITH DIFFERENTIAL/PLATELET
Abs Immature Granulocytes: 0.01 K/uL (ref 0.00–0.07)
Basophils Absolute: 0 K/uL (ref 0.0–0.1)
Basophils Relative: 1 %
Eosinophils Absolute: 0.1 K/uL (ref 0.0–0.5)
Eosinophils Relative: 1 %
HCT: 37.3 % (ref 36.0–46.0)
Hemoglobin: 11.6 g/dL — ABNORMAL LOW (ref 12.0–15.0)
Immature Granulocytes: 0 %
Lymphocytes Relative: 40 %
Lymphs Abs: 2.2 K/uL (ref 0.7–4.0)
MCH: 30.1 pg (ref 26.0–34.0)
MCHC: 31.1 g/dL (ref 30.0–36.0)
MCV: 96.9 fL (ref 80.0–100.0)
Monocytes Absolute: 0.7 K/uL (ref 0.1–1.0)
Monocytes Relative: 12 %
Neutro Abs: 2.6 K/uL (ref 1.7–7.7)
Neutrophils Relative %: 46 %
Platelets: 261 K/uL (ref 150–400)
RBC: 3.85 MIL/uL — ABNORMAL LOW (ref 3.87–5.11)
RDW: 14.5 % (ref 11.5–15.5)
WBC: 5.5 K/uL (ref 4.0–10.5)
nRBC: 0 % (ref 0.0–0.2)

## 2024-08-19 LAB — IRON AND TIBC
Iron: 48 ug/dL (ref 28–170)
Saturation Ratios: 14 % (ref 10.4–31.8)
TIBC: 352 ug/dL (ref 250–450)
UIBC: 304 ug/dL

## 2024-08-19 LAB — FERRITIN: Ferritin: 75 ng/mL (ref 11–307)

## 2024-08-19 LAB — VITAMIN B12: Vitamin B-12: 388 pg/mL (ref 180–914)

## 2024-08-19 NOTE — Progress Notes (Signed)
 [name] OFFICE PROGRESS NOTE  [name] Hai, MD (Inactive)  ASSESSMENT & PLAN:  Assessment & Plan Acute blood loss anemia - Failed to improve on oral iron  supplementation - Most recent Venofer  300 mg x2 in June 2025. -- She is taking vitamin B12 supplement 1,000 mcg daily - Labs 08/19/24 show hemoglobin 11.6 with unremarkable differential.  CMP shows CKD that is stable.  Iron  levels show saturations of 14% with a ferritin of 75.  TIBC is normal at 352.  We discussed, this is the most stable her labs have been in quite some time.  Given saturations are still low, would recommend 1 dose of IV Venofer  400 mg and follow-up with iron  labs in 2 months. -Continue octreotide  per GI diet as this seems to be helping keep her hemoglobin elevated. -She does not need Retacrit  when her hemoglobin is above 10.  Would recommend pushing out her visits from monthly to every 8 weeks.  She last received Retacrit  in March 2025. - Repeat CBC in 8 weeks for possible Retacrit  added 16 weeks for labs, possible Retacrit  and see NP.  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HCC) She is followed by nephrology.  Receives intermittent Epogen /Retacrit  injections approximately every 6 weeks. Since she started octreotide  with gastro, she has not needed Retacrit . We can extend out her Retacrit  to every 2 months but possibly get rid of it altogether. Continue octreotide . She does not need Retacrit  today.   Orders Placed This Encounter  Procedures   CBC with Differential    Standing Status:   Future    Expected Date:   11/19/2024    Expiration Date:   02/17/2025   Comprehensive metabolic panel    Standing Status:   Future    Expected Date:   11/19/2024    Expiration Date:   02/17/2025   Ferritin    Standing Status:   Future    Expected Date:   11/19/2024    Expiration Date:   02/17/2025   Iron  and TIBC ([facility] ONLY)    Standing Status:   Future    Expected Date:    11/19/2024    Expiration Date:   02/17/2025    INTERVAL HISTORY: Patient returns for follow-up.    Received 2 doses of Venofer  on 06/03/24 and 06/10/24. Tolerated infusions well.  She denies any bright red blood per rectum, melena or hematochezia.  Reports overall she is feeling good.  Appetite is 50% energy levels are 75%.  She has pain in her left hip and she was recently started on gabapentin  which does not appear to be helping.  She has been using a rolling walker which is helping with ambulation.  She has cough and shortness of breath with exertion.  Has chest pain at times.  Has rotating diarrhea and constipation.  Has trouble staying asleep.  Has occasional neuropathy in her fingers and her toes.  We reviewed cbc, cmp, ferritin, iron  panel, MMA, B12.   SUMMARY OF HEMATOLOGIC HISTORY: Oncology History   No history exists.   1.  Iron  deficiency anemia - Seen at the request of Dr. 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 - She receives monthly octreotide  injections via GI.   - She is on Eliquis  for atrial fibrillation (  cleared by GI). 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 - Hospitalized from 06/21/2022 through 06/23/2022 for symptomatic anemia.  Received 1 unit PRBC and IV Feraheme  x500 mg during hospitalization. - Hematology work-up (07/02/2022): Mildly elevated reticulocytes 3.7%.  Normal LDH 140. CBC with Hgb at 9.7/MCV 92.2. Normal folate, copper , B12, MMA, homocystine. Negative SPEP and immunofixation.  Mildly elevated free light chain ratio in keeping with CKD stage IV. - Rectal bleeding or melena  CBC    Component Value Date/Time   WBC 5.5 08/19/2024 1151    RBC 3.85 (L) 08/19/2024 1151   HGB 11.6 (L) 08/19/2024 1151   HCT 37.3 08/19/2024 1151   PLT 261 08/19/2024 1151   MCV 96.9 08/19/2024 1151   MCH 30.1 08/19/2024 1151   MCHC 31.1 08/19/2024 1151   RDW 14.5 08/19/2024 1151   LYMPHSABS 2.2 08/19/2024 1151   MONOABS 0.7 08/19/2024 1151   EOSABS 0.1 08/19/2024 1151   BASOSABS 0.0 08/19/2024 1151       Latest Ref Rng & Units 08/19/2024   11:51 AM 05/25/2024    8:51 AM 03/02/2024    9:01 AM  CMP  Glucose 70 - 99 mg/dL 887  881  99   BUN 8 - 23 mg/dL 22  30  22    Creatinine 0.44 - 1.00 mg/dL 8.41  8.35  8.41   Sodium 135 - 145 mmol/L 139  139  138   Potassium 3.5 - 5.1 mmol/L 3.5  4.9  4.8   Chloride 98 - 111 mmol/L 99  104  100   CO2 22 - 32 mmol/L 29  26  29    Calcium  8.9 - 10.3 mg/dL 8.6  9.1  9.2   Total Protein 6.5 - 8.1 g/dL 6.7  7.1  7.3   Total Bilirubin 0.0 - 1.2 mg/dL 0.6  0.6  0.5   Alkaline Phos 38 - 126 U/L 65  65  65   AST 15 - 41 U/L 28  29  23    ALT 0 - 44 U/L 24  16  11       Lab Results  Component Value Date   FERRITIN 75 08/19/2024   VITAMINB12 388 08/19/2024    Vitals:   08/19/24 1308 08/19/24 1313  BP: (!) 171/72 (!) 179/64  Pulse: (!) 59   Resp: 19   Temp: 98.2 F (36.8 C)   SpO2: 99%     Review of System:  Review of Systems  Constitutional:  Positive for malaise/fatigue.  Respiratory:  Positive for shortness of breath.   Cardiovascular:  Positive for chest pain.  Gastrointestinal:  Positive for constipation and diarrhea. Negative for blood in stool, melena, nausea and vomiting.  Musculoskeletal:  Positive for joint pain.  Neurological:  Positive for tingling, sensory change and headaches.    Physical Exam: Physical Exam Constitutional:      Appearance: Normal appearance. She is obese.  HENT:     Head: Normocephalic and atraumatic.  Eyes:     Pupils: Pupils are equal, round, and reactive to light.  Cardiovascular:     Rate and Rhythm: Normal rate and regular rhythm.     Heart  sounds: Normal heart sounds. No murmur heard. Pulmonary:     Effort: Pulmonary effort is normal.     Breath sounds: Normal breath sounds. No wheezing.  Abdominal:     General: Bowel sounds are normal. There is no distension.  Palpations: Abdomen is soft.     Tenderness: There is no abdominal tenderness.  Musculoskeletal:        General: Normal range of motion.     Cervical back: Normal range of motion.  Skin:    General: Skin is warm and dry.     Findings: No rash.  Neurological:     Mental Status: She is alert and oriented to person, place, and time.     Gait: Gait is intact.  Psychiatric:        Mood and Affect: Mood and affect normal.        Cognition and Memory: Memory normal.        Judgment: Judgment normal.      I spent 25 minutes dedicated to the care of this patient (face-to-face and non-face-to-face) on the date of the encounter to include what is described in the assessment and plan.,  Delon Hope, NP 08/19/2024 3:39 PM

## 2024-08-19 NOTE — Assessment & Plan Note (Addendum)
-   Failed to improve on oral iron  supplementation - Most recent Venofer  300 mg x2 in June 2025. -- She is taking vitamin B12 supplement 1,000 mcg daily - Labs 08/19/24 show hemoglobin 11.6 with unremarkable differential.  CMP shows CKD that is stable.  Iron  levels show saturations of 14% with a ferritin of 75.  TIBC is normal at 352.  We discussed, this is the most stable her labs have been in quite some time.  Given saturations are still low, would recommend 1 dose of IV Venofer  400 mg and follow-up with iron  labs in 2 months. -Continue octreotide  per GI diet as this seems to be helping keep her hemoglobin elevated. -She does not need Retacrit  when her hemoglobin is above 10.  Would recommend pushing out her visits from monthly to every 8 weeks.  She last received Retacrit  in March 2025. - Repeat CBC in 8 weeks for possible Retacrit  added 16 weeks for labs, possible Retacrit  and see NP.

## 2024-08-19 NOTE — Progress Notes (Signed)
 HGB 11.6. NO Retacrit  injection given today.

## 2024-08-19 NOTE — Assessment & Plan Note (Addendum)
 She is followed by nephrology.  Receives intermittent Epogen /Retacrit  injections approximately every 6 weeks. Since she started octreotide  with gastro, she has not needed Retacrit . We can extend out her Retacrit  to every 2 months but possibly get rid of it altogether. Continue octreotide . She does not need Retacrit  today.

## 2024-08-25 LAB — METHYLMALONIC ACID, SERUM: Methylmalonic Acid, Quantitative: 231 nmol/L (ref 0–378)

## 2024-08-28 ENCOUNTER — Inpatient Hospital Stay: Attending: Hematology

## 2024-08-28 VITALS — BP 169/73 | HR 55 | Temp 96.0°F | Resp 19

## 2024-08-28 DIAGNOSIS — I129 Hypertensive chronic kidney disease with stage 1 through stage 4 chronic kidney disease, or unspecified chronic kidney disease: Secondary | ICD-10-CM | POA: Insufficient documentation

## 2024-08-28 DIAGNOSIS — D631 Anemia in chronic kidney disease: Secondary | ICD-10-CM | POA: Insufficient documentation

## 2024-08-28 DIAGNOSIS — N184 Chronic kidney disease, stage 4 (severe): Secondary | ICD-10-CM | POA: Diagnosis not present

## 2024-08-28 DIAGNOSIS — Z79899 Other long term (current) drug therapy: Secondary | ICD-10-CM | POA: Insufficient documentation

## 2024-08-28 DIAGNOSIS — D500 Iron deficiency anemia secondary to blood loss (chronic): Secondary | ICD-10-CM

## 2024-08-28 MED ORDER — IRON SUCROSE 400 MG IVPB - SIMPLE MED
400.0000 mg | Freq: Once | Status: AC
  Administered 2024-08-28: 400 mg via INTRAVENOUS
  Filled 2024-08-28: qty 400

## 2024-08-28 MED ORDER — SODIUM CHLORIDE 0.9 % IV SOLN: INTRAVENOUS | Status: DC

## 2024-08-28 NOTE — Progress Notes (Signed)
 Patient presents today for Venofer  400 mg infusion. Vital signs stable. Patient denies any side effects related to her last iron  infusion.   Venolfer given today per MD orders. Tolerated infusion without adverse affects. Vital signs stable. No complaints at this time. Discharged from clinic ambulatory in stable condition. Alert and oriented x 3. F/U with [facility] as scheduled.

## 2024-08-28 NOTE — Patient Instructions (Signed)

## 2024-08-30 ENCOUNTER — Encounter: Payer: Self-pay | Admitting: Oncology

## 2024-08-30 ENCOUNTER — Encounter: Payer: Self-pay | Admitting: Gastroenterology

## 2024-09-01 ENCOUNTER — Encounter: Attending: Gastroenterology | Admitting: *Deleted

## 2024-09-01 VITALS — BP 147/76 | HR 54 | Temp 97.9°F | Resp 18

## 2024-09-01 DIAGNOSIS — K5521 Angiodysplasia of colon with hemorrhage: Secondary | ICD-10-CM | POA: Insufficient documentation

## 2024-09-01 DIAGNOSIS — K5520 Angiodysplasia of colon without hemorrhage: Secondary | ICD-10-CM | POA: Insufficient documentation

## 2024-09-01 MED ORDER — OCTREOTIDE ACETATE 20 MG IM KIT
20.0000 mg | PACK | Freq: Once | INTRAMUSCULAR | Status: AC
  Administered 2024-09-01: 20 mg via INTRAMUSCULAR

## 2024-09-01 NOTE — Progress Notes (Signed)
 Diagnosis: AVM of small bowel  Provider:  Kennedy Pfeiffer NP  Procedure: Injection  Sandostatin , Dose: 20 mg, Site: intramuscular, Number of injections: 1  Injection Site(s): Right upper quad. gluteus  Post Care: Observation period completed  Discharge: Condition: Good, Destination: Home . AVS Provided  Performed by:  Aowyn Rozeboom Ragsdale, RN

## 2024-09-04 ENCOUNTER — Inpatient Hospital Stay

## 2024-09-04 VITALS — BP 166/70 | HR 59 | Temp 97.6°F | Resp 18

## 2024-09-04 DIAGNOSIS — I129 Hypertensive chronic kidney disease with stage 1 through stage 4 chronic kidney disease, or unspecified chronic kidney disease: Secondary | ICD-10-CM | POA: Diagnosis not present

## 2024-09-04 DIAGNOSIS — D631 Anemia in chronic kidney disease: Secondary | ICD-10-CM

## 2024-09-04 DIAGNOSIS — D500 Iron deficiency anemia secondary to blood loss (chronic): Secondary | ICD-10-CM

## 2024-09-04 MED ORDER — SODIUM CHLORIDE 0.9 % IV SOLN: INTRAVENOUS | Status: DC

## 2024-09-04 MED ORDER — CETIRIZINE HCL 10 MG/ML IV SOLN: 5.0000 mg | Freq: Once | INTRAVENOUS | Status: DC

## 2024-09-04 MED ORDER — IRON SUCROSE 400 MG IVPB - SIMPLE MED
400.0000 mg | Freq: Once | Status: AC
  Administered 2024-09-04: 400 mg via INTRAVENOUS
  Filled 2024-09-04: qty 400

## 2024-09-04 MED ORDER — ACETAMINOPHEN 325 MG PO TABS: 650.0000 mg | ORAL_TABLET | Freq: Once | ORAL | Status: DC

## 2024-09-04 NOTE — Patient Instructions (Signed)
 [facility] - A DEPT OF [facility]  Discharge Instructions: Thank you for choosing [facility] to provide your oncology and hematology care.  If you have a lab appointment with the Cancer Center - please note that after April 8th, 2024, all labs will be drawn in the cancer center.  You do not have to check in or register with the main entrance as you have in the past but will complete your check-in in the cancer center.  Wear comfortable clothing and clothing appropriate for easy access to any Portacath or PICC line.   We strive to give you quality time with your provider. You may need to reschedule your appointment if you arrive late (15 or more minutes).  Arriving late affects you and other patients whose appointments are after yours.  Also, if you miss three or more appointments without notifying the office, you may be dismissed from the clinic at the provider's discretion.      For prescription refill requests, have your pharmacy contact our office and allow 72 hours for refills to be completed.    Today you received the following Venofer , return as scheduled.   To help prevent nausea and vomiting after your treatment, we encourage you to take your nausea medication as directed.  BELOW ARE SYMPTOMS THAT SHOULD BE REPORTED IMMEDIATELY: *FEVER GREATER THAN 100.4 F (38 C) OR HIGHER *CHILLS OR SWEATING *NAUSEA AND VOMITING THAT IS NOT CONTROLLED WITH YOUR NAUSEA MEDICATION *UNUSUAL SHORTNESS OF BREATH *UNUSUAL BRUISING OR BLEEDING *URINARY PROBLEMS (pain or burning when urinating, or frequent urination) *BOWEL PROBLEMS (unusual diarrhea, constipation, pain near the anus) TENDERNESS IN MOUTH AND THROAT WITH OR WITHOUT PRESENCE OF ULCERS (sore throat, sores in mouth, or a toothache) UNUSUAL RASH, SWELLING OR PAIN  UNUSUAL VAGINAL DISCHARGE OR ITCHING   Items with * indicate a potential emergency and should be followed up as soon as possible or  go to the Emergency Department if any problems should occur.  Please show the CHEMOTHERAPY ALERT CARD or IMMUNOTHERAPY ALERT CARD at check-in to the Emergency Department and triage nurse.  Should you have questions after your visit or need to cancel or reschedule your appointment, please contact [facility] [place] - A DEPT OF [facility] [phone]  and follow the prompts.  Office hours are 8:00 a.m. to 4:30 p.m. Monday - Friday. Please note that voicemails left after 4:00 p.m. may not be returned until the following business day.  We are closed weekends and major holidays. You have access to a nurse at all times for urgent questions. Please call the main number to the clinic [phone] and follow the prompts.  For any non-urgent questions, you may also contact your provider using MyChart. We now offer e-Visits for anyone 59 and older to request care online for non-urgent symptoms. For details visit [email or web address].   Also download the MyChart app! Go to the app store, search MyChart, open the app, select [facility], and log in with your MyChart username and password.

## 2024-09-04 NOTE — Progress Notes (Signed)
 Patient took pre-medications at home. Patient tolerated iron  infusion with no complaints voiced. Peripheral IV site clean and dry with good blood return noted before and after infusion. Band aid applied. VSS with discharge and left in satisfactory condition with no s/s of distress noted.

## 2024-09-29 ENCOUNTER — Encounter: Attending: Gastroenterology | Admitting: Emergency Medicine

## 2024-09-29 VITALS — BP 149/74 | HR 69 | Temp 98.1°F | Resp 17

## 2024-09-29 DIAGNOSIS — K5521 Angiodysplasia of colon with hemorrhage: Secondary | ICD-10-CM | POA: Insufficient documentation

## 2024-09-29 DIAGNOSIS — K5520 Angiodysplasia of colon without hemorrhage: Secondary | ICD-10-CM | POA: Insufficient documentation

## 2024-09-29 MED ORDER — OCTREOTIDE ACETATE 20 MG IM KIT
20.0000 mg | PACK | Freq: Once | INTRAMUSCULAR | Status: AC
  Administered 2024-09-29: 20 mg via INTRAMUSCULAR

## 2024-09-29 NOTE — Progress Notes (Signed)
 Diagnosis: AVMs small bowel  Provider:  Kennedy Pfeiffer NP  Procedure: Injection  Sandostatin , Dose: 20 mg, Site: intramuscular, Number of injections: 1  Injection Site(s): Right upper quad. gluteus  Post Care: Patient declined observation  Discharge: Condition: Good, Destination: Home . AVS Provided  Performed by:  Delon ONEIDA Officer, RN

## 2024-09-30 ENCOUNTER — Other Ambulatory Visit

## 2024-09-30 ENCOUNTER — Inpatient Hospital Stay

## 2024-10-13 ENCOUNTER — Telehealth: Payer: Self-pay | Admitting: *Deleted

## 2024-10-13 ENCOUNTER — Other Ambulatory Visit: Payer: Self-pay | Admitting: Gastroenterology

## 2024-10-13 NOTE — Telephone Encounter (Signed)
 Nurse manger from [facility] called and left a message about pt's cost of Octeotide.I called the infusions center to find out what was going on. The nurse that handles the prior authorizations 's at the infusions center sent me this message  (This is Keyana from [facility]. Mrs.Prices insurance pays [phone] a month towards the shot, and her Co-insurance is $865 a month.) I was unaware of the [place] that the pt was paying. I'm looking into some type of pt asst for her.

## 2024-10-14 ENCOUNTER — Inpatient Hospital Stay

## 2024-10-14 ENCOUNTER — Inpatient Hospital Stay: Attending: Hematology

## 2024-10-14 VITALS — BP 155/68 | HR 69 | Temp 96.6°F | Resp 18

## 2024-10-14 DIAGNOSIS — D631 Anemia in chronic kidney disease: Secondary | ICD-10-CM | POA: Diagnosis present

## 2024-10-14 DIAGNOSIS — D500 Iron deficiency anemia secondary to blood loss (chronic): Secondary | ICD-10-CM

## 2024-10-14 DIAGNOSIS — D62 Acute posthemorrhagic anemia: Secondary | ICD-10-CM

## 2024-10-14 DIAGNOSIS — N184 Chronic kidney disease, stage 4 (severe): Secondary | ICD-10-CM | POA: Insufficient documentation

## 2024-10-14 LAB — CBC WITH DIFFERENTIAL/PLATELET
Abs Immature Granulocytes: 0.01 K/uL (ref 0.00–0.07)
Basophils Absolute: 0 K/uL (ref 0.0–0.1)
Basophils Relative: 1 %
Eosinophils Absolute: 0.2 K/uL (ref 0.0–0.5)
Eosinophils Relative: 4 %
HCT: 35.5 % — ABNORMAL LOW (ref 36.0–46.0)
Hemoglobin: 10.7 g/dL — ABNORMAL LOW (ref 12.0–15.0)
Immature Granulocytes: 0 %
Lymphocytes Relative: 42 %
Lymphs Abs: 2.1 K/uL (ref 0.7–4.0)
MCH: 30.1 pg (ref 26.0–34.0)
MCHC: 30.1 g/dL (ref 30.0–36.0)
MCV: 100 fL (ref 80.0–100.0)
Monocytes Absolute: 0.6 K/uL (ref 0.1–1.0)
Monocytes Relative: 12 %
Neutro Abs: 2 K/uL (ref 1.7–7.7)
Neutrophils Relative %: 41 %
Platelets: 279 K/uL (ref 150–400)
RBC: 3.55 MIL/uL — ABNORMAL LOW (ref 3.87–5.11)
RDW: 14 % (ref 11.5–15.5)
WBC: 5 K/uL (ref 4.0–10.5)
nRBC: 0 % (ref 0.0–0.2)

## 2024-10-14 MED ORDER — EPOETIN ALFA 10000 UNIT/ML IJ SOLN
10000.0000 [IU] | Freq: Once | INTRAMUSCULAR | Status: AC
  Administered 2024-10-14: 10000 [IU] via SUBCUTANEOUS
  Filled 2024-10-14: qty 1

## 2024-10-14 NOTE — Progress Notes (Signed)
 Patient presents today for Epogen  injection per providers order.  Vital signs within parameters for injection.  HGB noted to be 10.7.  Stable during administration without incident; injection site WNL; see MAR for injection details.  Patient tolerated procedure well and without incident.  No questions or complaints noted at this time.

## 2024-10-21 ENCOUNTER — Telehealth: Payer: Self-pay | Admitting: Internal Medicine

## 2024-10-21 NOTE — Telephone Encounter (Signed)
 Pt c/o medication issue:  1. Name of Medication:   ELIQUIS  5 MG TABS tablet    2. How are you currently taking this medication (dosage and times per day)? As written  3. Are you having a reaction (difficulty breathing--STAT)? no  4. What is your medication issue?  Pt called in stating she urinated and it was bloody (bright red) overnight and during the day her urine was bright pink

## 2024-10-21 NOTE — Telephone Encounter (Signed)
 Spoke with pt, she reported the bleeding started this morning. She said she is only voiding a little at a time and her stomach feel strange when she voids. She reports [facility] and [facility]. Aware to continue on the eliquis  for now and to let us  know what the UA shows. She reports she does not feel well today but she has not eaten\. Encouraged patient to eat.

## 2024-10-23 ENCOUNTER — Encounter: Payer: Self-pay | Admitting: Internal Medicine

## 2024-10-23 ENCOUNTER — Ambulatory Visit: Attending: Internal Medicine | Admitting: Internal Medicine

## 2024-10-23 VITALS — BP 138/58 | HR 68 | Ht 62.0 in | Wt 169.2 lb

## 2024-10-23 DIAGNOSIS — I480 Paroxysmal atrial fibrillation: Secondary | ICD-10-CM

## 2024-10-23 NOTE — Patient Instructions (Signed)

## 2024-10-23 NOTE — Progress Notes (Signed)
 HPI [name] returns today for followup of her atrial fib and HTN. She is a pleasant 78 yo woman with PAF, who I saw a couple of months ago. She was started on flecainide  75 mg twice daily but reduced to 50 mg twice daily. Her symptoms of atrial fib have essentially resolved with only rare break through. She denies chest pain or sob. She saw Dr.Lambert in consultation about a Watchman and he thought she was not a good candidate. She has gotten back on eliquis  but has not had any bleeding. She denies any symptomatic atrial fib.   Allergies  Allergen Reactions   Acetaminophen -Codeine Hives, Itching and Rash   Amlodipine     Felt she retained fluid in her chest    Amoxicillin Hives    Did it involve swelling of the face/tongue/throat, SOB, or low BP? No Did it involve sudden or severe rash/hives, skin peeling, or any reaction on the inside of your mouth or nose? No Did you need to seek medical attention at a hospital or doctor's office? No When did it last happen?      10 + years If all above answers are NO, may proceed with cephalosporin use.    Doxycycline       sick and weak   Allopurinol Hives and Rash   Tramadol Hives and Rash     Current Outpatient Medications  Medication Sig Dispense Refill   acetaminophen  (TYLENOL ) 500 MG tablet Take 1,000 mg by mouth every 6 (six) hours as needed for moderate pain.     albuterol  (VENTOLIN  HFA) 108 (90 Base) MCG/ACT inhaler Inhale 2 puffs into the lungs every 6 (six) hours as needed for wheezing or shortness of breath. 18 g 0   calcitRIOL  (ROCALTROL ) 0.25 MCG capsule Take 0.25 mcg by mouth 3 (three) times a week. Monday,Wednesday and Fridays     Cholecalciferol (VITAMIN D -3) 25 MCG (1000 UT) CAPS Take 1,000 Units by mouth daily.     Colchicine  0.6 MG CAPS Take by mouth as needed.     cyclobenzaprine (FLEXERIL) 5 MG tablet Take 5 mg by mouth 3 (three) times daily.     diclofenac Sodium (VOLTAREN) 1 % GEL Apply 1 application topically 4  (four) times daily as needed (pain).     ELIQUIS  5 MG TABS tablet TAKE 1 TABLET BY MOUTH TWICE A DAY 180 tablet 3   flecainide  (TAMBOCOR ) 50 MG tablet TAKE 1 TABLET BY MOUTH TWICE A DAY 180 tablet 1   furosemide  (LASIX ) 20 MG tablet Take 20 mg by mouth daily. In the afternoon In addition to her 40 mg once per day.     furosemide  (LASIX ) 40 MG tablet TAKE 1 TABLET BY MOUTH EVERY DAY 30 tablet 0   gabapentin  (NEURONTIN ) 100 MG capsule Take 1 capsule (100 mg total) by mouth 3 (three) times daily. 90 capsule 2   hydrocortisone  (ANUSOL -HC) 2.5 % rectal cream Place rectally 2 (two) times daily as needed for hemorrhoids or anal itching. 30 g 1   losartan  (COZAAR ) 50 MG tablet TAKE 1 TABLET BY MOUTH EVERY DAY 30 tablet 0   lubiprostone  (AMITIZA ) 24 MCG capsule Take 1 capsule (24 mcg total) by mouth 2 (two) times daily with a meal. 180 capsule 3   octreotide  (SANDOSTATIN ) 100 MCG/ML SOLN injection INJECT 1 ML (100 MCG TOTAL) UNDER THE SKIN EVERY 12 (TWELVE) HOURS. 60 mL 11   ondansetron  (ZOFRAN ) 4 MG tablet Take 1 tablet (4 mg total) by mouth  every 6 (six) hours as needed for nausea. 20 tablet 0   pantoprazole  (PROTONIX ) 40 MG tablet TAKE 1 TABLET BY MOUTH EVERY DAY 90 tablet 3   polyethylene glycol (MIRALAX  / GLYCOLAX ) 17 g packet Take 17 g by mouth daily as needed for mild constipation. 14 each 0   potassium chloride  SA (KLOR-CON ) 20 MEQ tablet Take 20 mEq by mouth in the morning, at noon, and at bedtime.     predniSONE  (DELTASONE ) 10 MG tablet Take 1 tablet (10 mg total) by mouth 3 (three) times daily. 42 tablet 0   rosuvastatin  (CRESTOR ) 10 MG tablet TAKE 1 TABLET BY MOUTH EVERY DAY 30 tablet 0   tiZANidine  (ZANAFLEX ) 4 MG tablet Take 1 tablet (4 mg total) by mouth every 6 (six) hours as needed for muscle spasms. 30 tablet 0   vitamin C (ASCORBIC ACID) 250 MG tablet Take 250 mg by mouth daily.     No current facility-administered medications for this visit.     Past Medical History:  Diagnosis  Date   Anemia    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HCC) 09/11/2022   Chronic kidney disease    Constipation 07/17/2017   GERD (gastroesophageal reflux disease)    Gout    Grade II diastolic dysfunction 11/23/2020   Hyperlipidemia    Hypertension    Hypokalemia    OSA (obstructive sleep apnea) 08/09/2020   PAF (paroxysmal atrial fibrillation) (HCC)    PAF (paroxysmal atrial fibrillation) (HCC)    PUD (peptic ulcer disease)    Syncope    Neurocardiogenic    ROS:   All systems reviewed and negative except as noted in the HPI.   Past Surgical History:  Procedure Laterality Date   ABDOMINAL AORTOGRAM W/LOWER EXTREMITY Right 12/13/2020   Procedure: ABDOMINAL AORTOGRAM W/LOWER EXTREMITY;  Surgeon: Serene Gaile ORN, MD;  Location: [facility];  Service: Cardiovascular;  Laterality: Right;   ABDOMINAL HYSTERECTOMY     AMPUTATION Right 12/14/2020   Procedure: Right fifth toe amputation;  Surgeon: Oris Krystal FALCON, MD;  Location: [facility] OR;  Service: Vascular;  Laterality: Right;   CATARACT EXTRACTION Bilateral    COLONOSCOPY N/A 10/07/2014   Dr. Harvey: redundant left colon, moderate sized external hemorrhoids    COLONOSCOPY N/A 03/11/2020   Surgeon: Harvey Margo CROME, MD;  9 polyps, majority of which were tubular adenomas, nodular mucosa in the anus s/p biopsy which was benign.   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    HOT HEMOSTASIS  11/27/2020   Procedure: HOT HEMOSTASIS (ARGON PLASMA COAGULATION/BICAP);  Surgeon: Eartha Flavors, Toribio,  MD;  Location: [facility];  Service: Gastroenterology;;   LIPOMA RESECTION     PERIPHERAL VASCULAR INTERVENTION Right 12/13/2020   Procedure: PERIPHERAL VASCULAR INTERVENTION;  Surgeon: Serene Gaile ORN, MD;  Location: [facility];  Service: Cardiovascular;  Laterality: Right;  SFA/PT/AT   POLYPECTOMY  03/11/2020   Procedure: POLYPECTOMY;  Surgeon: Harvey Margo CROME, MD;  Location: [facility];  Service: Endoscopy;;  cecal, transverse, descending, sigmoid   TEE WITHOUT CARDIOVERSION  N/A 05/10/2021   Procedure: TRANSESOPHAGEAL ECHOCARDIOGRAM (TEE) WATCHMEN EVALUATION;  Surgeon: Delford Maude BROCKS, MD;  Location: [facility] ENDOSCOPY;  Service: Cardiovascular;  Laterality: N/A;     Family History  Problem Relation Age of Onset   Stroke Mother    Dementia Father    Cancer - Other Sister    Atrial fibrillation Sister    Heart failure Sister    Colon cancer Maternal Grandmother        diagnosed in her 76s     Social History   Socioeconomic History   Marital status: Single    Spouse name: Not on file   Number of children: Not on file   Years of education: Not on file   Highest education level: Not on file  Occupational History   Occupation: Retired  Tobacco Use   Smoking status: Former    Current packs/day: 0.00    Average packs/day: 1 pack/day for 33.6 years (33.6 ttl pk-yrs)    Types: Cigarettes    Start date: 03/16/1964    Quit date: 10/07/1997    Years since quitting: 27.0   Smokeless tobacco: Never  Vaping Use   Vaping status: Never Used  Substance and Sexual Activity   Alcohol use: No    Alcohol/week: 0.0 standard drinks of alcohol   Drug use: No   Sexual activity: Yes    Partners: Male  Other Topics Concern   Not on file  Social History Narrative   Not on file   Social Drivers of Health   Financial Resource Strain: Not on file  Food Insecurity: Not on file  Transportation Needs: Not on file  Physical Activity: Not on file  Stress: Not on file  Social  Connections: Not on file  Intimate Partner Violence: Not on file     BP (!) 138/58   Pulse 68   Ht 5' 2 (1.575 m)   Wt 169 lb 3.2 oz (76.7 kg)   SpO2 98%   BMI 30.95 kg/m   Physical Exam:  Well appearing NAD HEENT: Unremarkable Neck:  No JVD, no thyromegally Lymphatics:  No adenopathy Back:  No CVA tenderness Lungs:  Clear with no wheezes HEART:  Regular rate rhythm, no murmurs, no rubs, no clicks Abd:  soft, positive bowel sounds, no organomegally, no rebound, no guarding Ext:  2 plus pulses, no edema, no cyanosis, no clubbing Skin:  No rashes no nodules Neuro:  CN II through XII intact, motor grossly intact  EKG - nsr with PAC's  Assess/Plan: 1. Atrial fib - her symptoms are much improved on flecainide . She will continue her current meds at 50 bid. If she has break through, I asked her to take an extra flecainide , up to 200 mg in a 24 hour period. 2. HTN - her bp is well controlled.  3. GI bleeding - she was prescribed octreotide  and she has not had more bleeding and is on eliquis . She has not been thought to be a good candidate for anti-coagulation. 4. Obesity - I asked her to lose 10-15 lbs.    Danelle Ebrima Ranta,MD

## 2024-10-27 ENCOUNTER — Encounter: Attending: Gastroenterology | Admitting: *Deleted

## 2024-10-27 VITALS — BP 133/69 | HR 63 | Temp 97.9°F | Resp 16

## 2024-10-27 DIAGNOSIS — K5521 Angiodysplasia of colon with hemorrhage: Secondary | ICD-10-CM | POA: Insufficient documentation

## 2024-10-27 DIAGNOSIS — K5520 Angiodysplasia of colon without hemorrhage: Secondary | ICD-10-CM | POA: Insufficient documentation

## 2024-10-27 MED ORDER — OCTREOTIDE ACETATE 20 MG IM KIT
20.0000 mg | PACK | Freq: Once | INTRAMUSCULAR | Status: AC
  Administered 2024-10-27: 20 mg via INTRAMUSCULAR

## 2024-10-27 NOTE — Progress Notes (Signed)
 Diagnosis: AVM of small bowel  Provider:  Kennedy Pfeiffer NP  Procedure: Injection  Sandostatin , Dose: 20 mg, Site: intramuscular, Number of injections: 1  Injection Site(s): Left vastus lateralis  Post Care: Observation period completed  Discharge: Condition: Good, Destination: Home . AVS Provided  Performed by:  Baldwin Darice Helling, RN

## 2024-11-04 NOTE — Telephone Encounter (Signed)
 Infusion center is working on this. I have called to check on it. They were still trying to get it cheaper.

## 2024-11-06 ENCOUNTER — Other Ambulatory Visit: Payer: Self-pay | Admitting: Internal Medicine

## 2024-11-11 ENCOUNTER — Other Ambulatory Visit: Payer: Self-pay | Admitting: *Deleted

## 2024-11-11 ENCOUNTER — Ambulatory Visit

## 2024-11-11 ENCOUNTER — Ambulatory Visit: Admitting: Oncology

## 2024-11-11 ENCOUNTER — Inpatient Hospital Stay

## 2024-11-11 DIAGNOSIS — I739 Peripheral vascular disease, unspecified: Secondary | ICD-10-CM

## 2024-11-23 ENCOUNTER — Telehealth: Payer: Self-pay | Admitting: Pharmacy Technician

## 2024-11-23 NOTE — Telephone Encounter (Signed)
 Patient is receiving Assistance Medication - Supplied Externally. Medication: Sandostatin  [employer] Manufacturer: [employer] Approval Dates: Approved from 11/23/2024 until 12/23/2025. ID: [id number] Reason: Excessive OOP First DOS: 11/27/2024

## 2024-11-24 ENCOUNTER — Ambulatory Visit

## 2024-11-24 ENCOUNTER — Encounter: Payer: Self-pay | Admitting: Oncology

## 2024-11-24 ENCOUNTER — Encounter: Payer: Self-pay | Admitting: Gastroenterology

## 2024-11-27 ENCOUNTER — Ambulatory Visit

## 2024-12-02 ENCOUNTER — Telehealth: Payer: Self-pay | Admitting: Internal Medicine

## 2024-12-02 ENCOUNTER — Ambulatory Visit

## 2024-12-02 ENCOUNTER — Ambulatory Visit: Attending: Gastroenterology

## 2024-12-02 VITALS — BP 154/71 | HR 64 | Temp 97.9°F | Resp 16

## 2024-12-02 DIAGNOSIS — K5521 Angiodysplasia of colon with hemorrhage: Secondary | ICD-10-CM

## 2024-12-02 DIAGNOSIS — K5520 Angiodysplasia of colon without hemorrhage: Secondary | ICD-10-CM | POA: Insufficient documentation

## 2024-12-02 MED ORDER — OCTREOTIDE ACETATE 20 MG IM KIT
20.0000 mg | PACK | Freq: Once | INTRAMUSCULAR | Status: AC
  Administered 2024-12-02: 20 mg via INTRAMUSCULAR
  Filled 2024-12-02: qty 1

## 2024-12-02 NOTE — Progress Notes (Signed)
 Diagnosis: AVM of Small Bowel  Provider:  Kennedy Pfeiffer NP  Procedure: Injection  Sandostatin , Dose: 20 mg, Site: intramuscular, Number of injections: 1  Injection Site(s): Left ventrogluteal  Post Care: Patient declined observation  Discharge: Condition: Good, Destination: Home . AVS Declined  Performed by:  Delon ONEIDA Officer, RN

## 2024-12-02 NOTE — Telephone Encounter (Signed)
 Pt c/o medication issue:  1. Name of Medication: losartan  (COZAAR ) 50 MG tablet   2. How are you currently taking this medication (dosage and times per day)? As written  3. Are you having a reaction (difficulty breathing--STAT)? No  4. What is your medication issue? Pt states her nephrologist would like her to stop this medication for one month to improve her kidney function.

## 2024-12-03 ENCOUNTER — Other Ambulatory Visit: Payer: Self-pay | Admitting: Internal Medicine

## 2024-12-04 NOTE — Progress Notes (Signed)
 Patient called stating she had some lab work done at [facility] with pcp and was told her iron  was low but she couldn't remember the result and did not have paper with her at the time of the phone call. Patient states she has been feeling tired lately and was told by pcp to keep her appointment with cancer center on Wednesday and to bring her lab results with her. Patient stated she is currently being treated for UTI and did have blood in urine but that is better now. Patient wanted to know what to do in mean time before app on Wednesday. Informed patient to eat iron  rich foods and pair with vitamin c for absorption and would come up with plan on Wednesday with Rebekah.

## 2024-12-06 NOTE — Telephone Encounter (Signed)
 I agree with the nephrologist.

## 2024-12-07 NOTE — Telephone Encounter (Signed)
 Spoke to patient who voiced understanding. Pt stated that since stopping Losartan , her bp has shot up. Pt provided two readings : 180/84, 189/73 and asked if there was a different medication provider could prescribe.  Please advise.

## 2024-12-07 NOTE — Progress Notes (Unsigned)
 [facility] [place]  Patient name: [name] [name] MRN: [id number] DOB: 1946-07-06 Sex: female  REASON FOR VISIT: Evaluation pain in her right great toe  HPI: [name] [name] is a 78 y.o. female here today for evaluation.  She is well-known to our practice from prior intervention on the right leg.  She had presented in 2021 with gangrene in her right fifth toe.  She underwent PTA of her right superficial femoral artery with Dr. Serene and subsequent right great toe amputation by myself.  She healed this without difficulty.  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  She noted approximately 1 month ago that she had soreness in the right great toe metatarsal head.  She reports that this subsequently has nearly completely resolved.  She does have a history of gout.  She was concerned that this may be indication of arterial insufficiency and is seen today for further evaluation.  She denies any claudication symptoms.  Past Medical History:  Diagnosis Date   Anemia    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HCC) 09/11/2022   Chronic kidney disease    Constipation 07/17/2017   GERD (gastroesophageal reflux disease)    Gout    Grade II diastolic dysfunction 11/23/2020   Hyperlipidemia    Hypertension    Hypokalemia    OSA (obstructive sleep apnea) 08/09/2020   PAF (paroxysmal atrial fibrillation) (HCC)    PAF (paroxysmal atrial fibrillation) (HCC)    PUD (peptic ulcer disease)    Syncope    Neurocardiogenic    Family History  Problem Relation Age of Onset   Stroke Mother    Dementia Father    Cancer - Other Sister    Atrial fibrillation Sister    Heart failure Sister    Colon cancer Maternal Grandmother        diagnosed in her 4s    SOCIAL HISTORY: Social History   Tobacco Use   Smoking status: Former    Current packs/day: 0.00    Average packs/day: 1 pack/day for 33.6 years  (33.6 ttl pk-yrs)    Types: Cigarettes    Start date: 03/16/1964    Quit date: 10/07/1997    Years since quitting: 27.1   Smokeless tobacco: Never  Substance Use Topics   Alcohol use: No    Alcohol/week: 0.0 standard drinks of alcohol    Allergies  Allergen Reactions   Acetaminophen -Codeine Hives, Itching and Rash   Amlodipine     Felt she retained fluid in her chest    Amoxicillin Hives    Did it involve swelling of the face/tongue/throat, SOB, or low BP? No Did it involve sudden or severe rash/hives, skin peeling, or any reaction on the inside of your mouth or nose? No Did you need to seek medical attention at a hospital or doctor's office? No When did it last happen?      10 + years If all above answers are NO, may proceed with cephalosporin use.    Doxycycline       sick and weak   Allopurinol Hives and Rash   Tramadol Hives and Rash    Current Outpatient Medications  Medication Sig Dispense Refill   acetaminophen  (TYLENOL ) 500 MG tablet Take 1,000 mg by mouth every 6 (six) hours as needed for moderate pain.     albuterol  (VENTOLIN  HFA) 108 (90 Base) MCG/ACT inhaler Inhale 2 puffs into the lungs every 6 (six) hours as needed  for wheezing or shortness of breath. 18 g 0   calcitRIOL  (ROCALTROL ) 0.25 MCG capsule Take 0.25 mcg by mouth 3 (three) times a week. Monday,Wednesday and Fridays     Cholecalciferol (VITAMIN D -3) 25 MCG (1000 UT) CAPS Take 1,000 Units by mouth daily.     Colchicine  0.6 MG CAPS Take by mouth as needed.     cyclobenzaprine (FLEXERIL) 5 MG tablet Take 5 mg by mouth 3 (three) times daily.     diclofenac Sodium (VOLTAREN) 1 % GEL Apply 1 application topically 4 (four) times daily as needed (pain).     ELIQUIS  5 MG TABS tablet TAKE 1 TABLET BY MOUTH TWICE A DAY 180 tablet 3   flecainide  (TAMBOCOR ) 50 MG tablet TAKE 1 TABLET BY MOUTH TWICE A DAY 180 tablet 3   furosemide  (LASIX ) 20 MG tablet Take 20 mg by mouth daily. In the afternoon In addition to her 40  mg once per day.     furosemide  (LASIX ) 40 MG tablet TAKE 1 TABLET BY MOUTH EVERY DAY 90 tablet 3   gabapentin  (NEURONTIN ) 100 MG capsule Take 1 capsule (100 mg total) by mouth 3 (three) times daily. 90 capsule 2   hydrocortisone  (ANUSOL -HC) 2.5 % rectal cream Place rectally 2 (two) times daily as needed for hemorrhoids or anal itching. 30 g 1   losartan  (COZAAR ) 50 MG tablet TAKE 1 TABLET BY MOUTH EVERY DAY 90 tablet 3   lubiprostone  (AMITIZA ) 24 MCG capsule Take 1 capsule (24 mcg total) by mouth 2 (two) times daily with a meal. 180 capsule 3   octreotide  (SANDOSTATIN ) 100 MCG/ML SOLN injection INJECT 1 ML (100 MCG TOTAL) UNDER THE SKIN EVERY 12 (TWELVE) HOURS. 60 mL 11   ondansetron  (ZOFRAN ) 4 MG tablet Take 1 tablet (4 mg total) by mouth every 6 (six) hours as needed for nausea. 20 tablet 0   pantoprazole  (PROTONIX ) 40 MG tablet TAKE 1 TABLET BY MOUTH EVERY DAY 90 tablet 3   polyethylene glycol (MIRALAX  / GLYCOLAX ) 17 g packet Take 17 g by mouth daily as needed for mild constipation. 14 each 0   potassium chloride  SA (KLOR-CON ) 20 MEQ tablet Take 20 mEq by mouth in the morning, at noon, and at bedtime.     predniSONE  (DELTASONE ) 10 MG tablet Take 1 tablet (10 mg total) by mouth 3 (three) times daily. 42 tablet 0   rosuvastatin  (CRESTOR ) 10 MG tablet TAKE 1 TABLET BY MOUTH EVERY DAY 30 tablet 0   tiZANidine  (ZANAFLEX ) 4 MG tablet Take 1 tablet (4 mg total) by mouth every 6 (six) hours as needed for muscle spasms. 30 tablet 0   vitamin C (ASCORBIC ACID) 250 MG tablet Take 250 mg by mouth daily.     No current facility-administered medications for this visit.    REVIEW OF SYSTEMS:  [X]  denotes positive finding, [ ]  denotes negative finding Cardiac  Comments:  Chest pain or chest pressure:    Shortness of breath upon exertion:    Short of breath when lying flat:    Irregular heart rhythm:        Vascular    Pain in calf, thigh, or hip brought on by ambulation:    Pain in feet at night  that wakes you up from your sleep:     Blood clot in your veins:    Leg swelling:           PHYSICAL EXAM: There were no vitals filed for this visit.   GENERAL: The  patient is a well-nourished female, in no acute distress. The vital signs are documented above. CARDIOVASCULAR: I do not palpate pedal pulses bilaterally. PULMONARY: There is good air exchange  MUSCULOSKELETAL: There are no major deformities or cyanosis. NEUROLOGIC: No focal weakness or paresthesias are detected. SKIN: There are no ulcers or rashes noted. PSYCHIATRIC: The patient has a normal affect.  DATA:  Ankle arm index 0.5 bilaterally.  Toe brachial index diminished bilaterally.  MEDICAL ISSUES: Known bilateral moderate arterial insufficiency.  She does not have any evidence of tissue loss or arterial rest pain.  The discomfort in her right great metatarsal head has resolved and does not appear to be ischemic.  She will continue to keep a close eye on this and notify us  should she have any new difficulty.  Otherwise we will see her in 1 year with repeat noninvasive studies    Krystal PHEBE Doing, MD FACS [facility] [phone]  Note: Portions of this report may have been transcribed using voice recognition software.  Every effort has been made to ensure accuracy; however, inadvertent computerized transcription errors may still be present.

## 2024-12-08 ENCOUNTER — Ambulatory Visit (INDEPENDENT_AMBULATORY_CARE_PROVIDER_SITE_OTHER)

## 2024-12-08 ENCOUNTER — Ambulatory Visit: Admitting: Vascular Surgery

## 2024-12-08 ENCOUNTER — Encounter: Payer: Self-pay | Admitting: Vascular Surgery

## 2024-12-08 VITALS — BP 144/78 | HR 60 | Ht 62.0 in | Wt 169.2 lb

## 2024-12-08 DIAGNOSIS — I739 Peripheral vascular disease, unspecified: Secondary | ICD-10-CM

## 2024-12-08 LAB — VAS US ABI WITH/WO TBI
Left ABI: 0.68
Right ABI: 0.68

## 2024-12-08 NOTE — Telephone Encounter (Signed)
 Patient notified and verbalized understanding. Patient had no further questions or concerns at this time.

## 2024-12-08 NOTE — Telephone Encounter (Signed)
 Because of her kidney condition and because we do not want to many cooks in the kitchen, I will defer uptitration vs a different medication decision to her nephrologist. GT

## 2024-12-09 ENCOUNTER — Inpatient Hospital Stay: Attending: Hematology

## 2024-12-09 ENCOUNTER — Inpatient Hospital Stay

## 2024-12-09 ENCOUNTER — Inpatient Hospital Stay: Admitting: Oncology

## 2024-12-09 VITALS — BP 163/72 | HR 60 | Temp 97.2°F | Resp 18 | Wt 169.0 lb

## 2024-12-09 DIAGNOSIS — Z79899 Other long term (current) drug therapy: Secondary | ICD-10-CM | POA: Insufficient documentation

## 2024-12-09 DIAGNOSIS — K649 Unspecified hemorrhoids: Secondary | ICD-10-CM | POA: Insufficient documentation

## 2024-12-09 DIAGNOSIS — I129 Hypertensive chronic kidney disease with stage 1 through stage 4 chronic kidney disease, or unspecified chronic kidney disease: Secondary | ICD-10-CM | POA: Insufficient documentation

## 2024-12-09 DIAGNOSIS — R5383 Other fatigue: Secondary | ICD-10-CM | POA: Diagnosis not present

## 2024-12-09 DIAGNOSIS — N184 Chronic kidney disease, stage 4 (severe): Secondary | ICD-10-CM

## 2024-12-09 DIAGNOSIS — D631 Anemia in chronic kidney disease: Secondary | ICD-10-CM

## 2024-12-09 DIAGNOSIS — D62 Acute posthemorrhagic anemia: Secondary | ICD-10-CM | POA: Diagnosis not present

## 2024-12-09 DIAGNOSIS — Z7901 Long term (current) use of anticoagulants: Secondary | ICD-10-CM | POA: Diagnosis not present

## 2024-12-09 DIAGNOSIS — Z87440 Personal history of urinary (tract) infections: Secondary | ICD-10-CM | POA: Diagnosis not present

## 2024-12-09 DIAGNOSIS — I4891 Unspecified atrial fibrillation: Secondary | ICD-10-CM | POA: Diagnosis not present

## 2024-12-09 LAB — CBC WITH DIFFERENTIAL/PLATELET
Abs Immature Granulocytes: 0 K/uL (ref 0.00–0.07)
Basophils Absolute: 0 K/uL (ref 0.0–0.1)
Basophils Relative: 1 %
Eosinophils Absolute: 0.1 K/uL (ref 0.0–0.5)
Eosinophils Relative: 3 %
HCT: 38.3 % (ref 36.0–46.0)
Hemoglobin: 11.4 g/dL — ABNORMAL LOW (ref 12.0–15.0)
Immature Granulocytes: 0 %
Lymphocytes Relative: 47 %
Lymphs Abs: 2.4 K/uL (ref 0.7–4.0)
MCH: 28 pg (ref 26.0–34.0)
MCHC: 29.8 g/dL — ABNORMAL LOW (ref 30.0–36.0)
MCV: 94.1 fL (ref 80.0–100.0)
Monocytes Absolute: 0.5 K/uL (ref 0.1–1.0)
Monocytes Relative: 9 %
Neutro Abs: 2 K/uL (ref 1.7–7.7)
Neutrophils Relative %: 40 %
Platelets: 255 K/uL (ref 150–400)
RBC: 4.07 MIL/uL (ref 3.87–5.11)
RDW: 14.1 % (ref 11.5–15.5)
WBC: 5.1 K/uL (ref 4.0–10.5)
nRBC: 0 % (ref 0.0–0.2)

## 2024-12-09 LAB — COMPREHENSIVE METABOLIC PANEL WITH GFR
ALT: 11 U/L (ref 0–44)
AST: 25 U/L (ref 15–41)
Albumin: 4.4 g/dL (ref 3.5–5.0)
Alkaline Phosphatase: 85 U/L (ref 38–126)
Anion gap: 12 (ref 5–15)
BUN: 21 mg/dL (ref 8–23)
CO2: 27 mmol/L (ref 22–32)
Calcium: 9.5 mg/dL (ref 8.9–10.3)
Chloride: 103 mmol/L (ref 98–111)
Creatinine, Ser: 1.54 mg/dL — ABNORMAL HIGH (ref 0.44–1.00)
GFR, Estimated: 34 mL/min — ABNORMAL LOW (ref >60)
Glucose, Bld: 74 mg/dL (ref 70–99)
Potassium: 4.7 mmol/L (ref 3.5–5.1)
Sodium: 141 mmol/L (ref 135–145)
Total Bilirubin: 0.4 mg/dL (ref 0.0–1.2)
Total Protein: 7.3 g/dL (ref 6.5–8.1)

## 2024-12-09 LAB — IRON AND TIBC
Iron: 45 ug/dL (ref 28–170)
Saturation Ratios: 13 % (ref 10.4–31.8)
TIBC: 336 ug/dL (ref 250–450)
UIBC: 291 ug/dL

## 2024-12-09 LAB — FERRITIN: Ferritin: 151 ng/mL (ref 11–307)

## 2024-12-09 NOTE — Assessment & Plan Note (Addendum)
 Failed to improve on oral iron  supplementation Most recent Venofer  300 mg x2 in June 2025. She is taking vitamin B12 supplement 1,000 mcg daily Labs 12/09/2024 show hemoglobin of 11.4 with unremarkable differential.  Iron  saturation 13% with normal TIBC.  Ferritin 151.  CMP shows creatinine 1.54 with GFR 34. In the setting of CKD, would like iron  saturations closer to 30%. Recommend 2 doses of IV Venofer  for 100 mg. Continue octreotide  per GI diet as this seems to be helping keep her hemoglobin elevated. She does not need Retacrit  when her hemoglobin is above 11.  Return to clinic in 3 months with labs plus or minus Retacrit .

## 2024-12-09 NOTE — Progress Notes (Signed)
 Hemoglobin 11.4 no retacrit  today per parameters.

## 2024-12-09 NOTE — Assessment & Plan Note (Addendum)
 She is followed by nephrology.  Receives intermittent Epogen /Retacrit  injections intermittently last given in October 2025. Since she has been receiving octreotide , she requires less and less Retacrit . We can extend out her Retacrit  to every 3 months but possibly get rid of it altogether. Continue octreotide . She does not need Retacrit  today. Hemoglobin is 11.4.

## 2024-12-09 NOTE — Progress Notes (Signed)
 [facility] OFFICE PROGRESS NOTE  Marchelle Clem Pitts, MD  ASSESSMENT & PLAN:  Assessment & Plan Anemia in stage 4 chronic kidney disease [facility]) She is followed by nephrology.  Receives intermittent Epogen /Retacrit  injections intermittently last given in October 2025. Since she has been receiving octreotide , she requires less and less Retacrit . We can extend out her Retacrit  to every 3 months but possibly get rid of it altogether. Continue octreotide . She does not need Retacrit  today. Hemoglobin is 11.4.  Acute blood loss anemia Failed to improve on oral iron  supplementation Most recent Venofer  300 mg x2 in June 2025. She is taking vitamin B12 supplement 1,000 mcg daily Labs 12/09/2024 show hemoglobin of 11.4 with unremarkable differential.  Iron  saturation 13% with normal TIBC.  Ferritin 151.  CMP shows creatinine 1.54 with GFR 34. In the setting of CKD, would like iron  saturations closer to 30%. Recommend 2 doses of IV Venofer  for 100 mg. Continue octreotide  per GI diet as this seems to be helping keep her hemoglobin elevated. She does not need Retacrit  when her hemoglobin is above 11.  Return to clinic in 3 months with labs plus or minus Retacrit .   Orders Placed This Encounter  Procedures   CBC with Differential    Standing Status:   Future    Expected Date:   03/31/2025    Expiration Date:   12/09/2025   Comprehensive metabolic panel    Standing Status:   Future    Expected Date:   03/31/2025    Expiration Date:   12/09/2025   Ferritin    Standing Status:   Future    Expected Date:   03/31/2025    Expiration Date:   12/09/2025   Iron  and TIBC ([facility] ONLY)    Standing Status:   Future    Expected Date:   03/31/2025    Expiration Date:   12/09/2025    INTERVAL HISTORY: Patient returns for follow-up for anemia secondary to iron  deficiency and CKD.  She denies any recent hospitalizations, surgeries or changes to her baseline health.   She receives intermittent Epogen  last given on 10/14/2024.  She is currently receiving octreotide  with GI which appears to be holding her hemoglobin up between 10 and 11 and here recently higher than that.  She denies any bright red blood per rectum, melena or hematochezia.  Overall she is feeling well with some tiredness.  Her appetite is 75% energy levels are 25%.  She has some shortness of breath with exertion, dizziness with quick position changes, headaches and numbness and burning in her hands.  She occasionally will have blood in her toilet paper from hemorrhoids.  She was recently treated for a UTI with antibiotics.  Symptoms have resolved.  She has been trying to eat foods rich in iron  such as beets and liver.  We reviewed cbc, cmp, ferritin, iron  panel, MMA, B12.   SUMMARY OF HEMATOLOGIC HISTORY: Oncology History   No problem history exists.   1.  Iron  deficiency anemia - Seen at the request of Dr. 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 - She receives monthly octreotide  injections via GI.   - She is on Eliquis  for atrial fibrillation (cleared by GI). 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 - Hospitalized from 06/21/2022 through 06/23/2022 for symptomatic anemia.  Received 1 unit PRBC and IV Feraheme  x500 mg during hospitalization. - Hematology work-up (07/02/2022): Mildly elevated reticulocytes 3.7%.  Normal LDH 140. CBC with Hgb at 9.7/MCV 92.2. Normal folate, copper , B12, MMA, homocystine. Negative SPEP and immunofixation.  Mildly elevated free light chain ratio in keeping with CKD stage IV. - Rectal bleeding or melena  CBC    Component Value Date/Time    WBC 5.1 12/09/2024 0957   RBC 4.07 12/09/2024 0957   HGB 11.4 (L) 12/09/2024 0957   HCT 38.3 12/09/2024 0957   PLT 255 12/09/2024 0957   MCV 94.1 12/09/2024 0957   MCH 28.0 12/09/2024 0957   MCHC 29.8 (L) 12/09/2024 0957   RDW 14.1 12/09/2024 0957   LYMPHSABS 2.4 12/09/2024 0957   MONOABS 0.5 12/09/2024 0957   EOSABS 0.1 12/09/2024 0957   BASOSABS 0.0 12/09/2024 0957       Latest Ref Rng & Units 12/09/2024    9:57 AM 08/19/2024   11:51 AM 05/25/2024    8:51 AM  CMP  Glucose 70 - 99 mg/dL 74  887  881   BUN 8 - 23 mg/dL 21  22  30    Creatinine 0.44 - 1.00 mg/dL 8.45  8.41  8.35   Sodium 135 - 145 mmol/L 141  139  139   Potassium 3.5 - 5.1 mmol/L 4.7  3.5  4.9   Chloride 98 - 111 mmol/L 103  99  104   CO2 22 - 32 mmol/L 27  29  26    Calcium  8.9 - 10.3 mg/dL 9.5  8.6  9.1   Total Protein 6.5 - 8.1 g/dL 7.3  6.7  7.1   Total Bilirubin 0.0 - 1.2 mg/dL 0.4  0.6  0.6   Alkaline Phos 38 - 126 U/L 85  65  65   AST 15 - 41 U/L 25  28  29    ALT 0 - 44 U/L 11  24  16       Lab Results  Component Value Date   FERRITIN 151 12/09/2024   VITAMINB12 388 08/19/2024    Vitals:   12/09/24 1115 12/09/24 1119  BP: (!) 149/69 (!) 163/72  Pulse: 60   Resp: 18   Temp: (!) 97.2 F (36.2 C)   SpO2: 94%     Review of System:  Review of Systems  Constitutional:  Positive for malaise/fatigue.  Respiratory:  Positive for shortness of breath.   Gastrointestinal:  Positive for blood in stool (Hemorrhoids).  Neurological:  Positive for dizziness, sensory change and headaches.    Physical Exam: Physical Exam Constitutional:      Appearance: Normal appearance.  HENT:     Head: Normocephalic and atraumatic.  Eyes:     Pupils: Pupils are equal, round, and reactive to light.  Cardiovascular:     Rate and Rhythm: Normal rate and regular rhythm.     Heart sounds: Normal heart sounds. No murmur heard. Pulmonary:     Effort: Pulmonary effort is normal.     Breath sounds: Normal breath  sounds. No wheezing.  Abdominal:     General: Bowel sounds are normal. There is no distension.     Palpations: Abdomen is soft.     Tenderness: There is no abdominal tenderness.  Musculoskeletal:        General: Normal range of motion.     Cervical back: Normal range of  motion.  Skin:    General: Skin is warm and dry.     Findings: No rash.  Neurological:     Mental Status: She is alert and oriented to person, place, and time.     Gait: Gait is intact.  Psychiatric:        Mood and Affect: Mood and affect normal.        Cognition and Memory: Memory normal.        Judgment: Judgment normal.      I spent 25 minutes dedicated to the care of this patient (face-to-face and non-face-to-face) on the date of the encounter to include what is described in the assessment and plan.,  Delon Hope, NP 12/09/2024 12:11 PM

## 2024-12-11 ENCOUNTER — Inpatient Hospital Stay

## 2024-12-11 VITALS — BP 141/55 | HR 59 | Temp 97.0°F | Resp 17

## 2024-12-11 DIAGNOSIS — D500 Iron deficiency anemia secondary to blood loss (chronic): Secondary | ICD-10-CM

## 2024-12-11 DIAGNOSIS — I129 Hypertensive chronic kidney disease with stage 1 through stage 4 chronic kidney disease, or unspecified chronic kidney disease: Secondary | ICD-10-CM | POA: Diagnosis not present

## 2024-12-11 DIAGNOSIS — D631 Anemia in chronic kidney disease: Secondary | ICD-10-CM

## 2024-12-11 MED ORDER — SODIUM CHLORIDE 0.9 % IV SOLN: INTRAVENOUS | Status: DC

## 2024-12-11 MED ORDER — EPOETIN ALFA 10000 UNIT/ML IJ SOLN: 10000.0000 [IU] | Freq: Once | INTRAMUSCULAR | Status: DC

## 2024-12-11 MED ORDER — IRON SUCROSE 400 MG IVPB - SIMPLE MED
400.0000 mg | Freq: Once | Status: AC
  Administered 2024-12-11: 400 mg via INTRAVENOUS
  Filled 2024-12-11: qty 400

## 2024-12-11 NOTE — Progress Notes (Signed)
Venofer given today per MD orders. Tolerated infusion without adverse affects. Vital signs stable. No complaints at this time. Discharged from clinic ambulatory in stable condition. Alert and oriented x 3. F/U with [facility] as scheduled.

## 2024-12-11 NOTE — Patient Instructions (Signed)

## 2024-12-11 NOTE — Progress Notes (Signed)
 Patient presents today for Venofer  infusion per providers order.  Vital signs WNL.  Patient has no new complaints at this time.

## 2024-12-14 NOTE — Telephone Encounter (Signed)
 Jerel Lamp at ([facility]) a message Hey! I'm sorry I forgot to update.SABRA She was approved for free drug. She got her first free dose on the 10th This is what she sent me back. So pt has been getting her Octeotide for free.

## 2024-12-21 ENCOUNTER — Encounter: Payer: Self-pay | Admitting: *Deleted

## 2024-12-22 ENCOUNTER — Inpatient Hospital Stay

## 2024-12-22 VITALS — BP 145/78 | HR 60 | Temp 97.9°F | Resp 18

## 2024-12-22 DIAGNOSIS — D631 Anemia in chronic kidney disease: Secondary | ICD-10-CM

## 2024-12-22 DIAGNOSIS — I129 Hypertensive chronic kidney disease with stage 1 through stage 4 chronic kidney disease, or unspecified chronic kidney disease: Secondary | ICD-10-CM | POA: Diagnosis not present

## 2024-12-22 DIAGNOSIS — D500 Iron deficiency anemia secondary to blood loss (chronic): Secondary | ICD-10-CM

## 2024-12-22 MED ORDER — IRON SUCROSE 400 MG IVPB - SIMPLE MED
400.0000 mg | Freq: Once | Status: AC
  Administered 2024-12-22: 400 mg via INTRAVENOUS
  Filled 2024-12-22: qty 400

## 2024-12-22 MED ORDER — SODIUM CHLORIDE 0.9 % IV SOLN: INTRAVENOUS | Status: DC

## 2024-12-22 NOTE — Patient Instructions (Signed)
 [facility] [place] - A DEPT OF [place].  HOSPITAL  Discharge Instructions: Thank you for choosing [facility] to provide your oncology and hematology care.  If you have a lab appointment with the Cancer Center - please note that after April 8th, 2024, all labs will be drawn in the cancer center.  You do not have to check in or register with the main entrance as you have in the past but will complete your check-in in the cancer center.  Wear comfortable clothing and clothing appropriate for easy access to any Portacath or PICC line.   We strive to give you quality time with your provider. You may need to reschedule your appointment if you arrive late (15 or more minutes).  Arriving late affects you and other patients whose appointments are after yours.  Also, if you miss three or more appointments without notifying the office, you may be dismissed from the clinic at the provider's discretion.      For prescription refill requests, have your pharmacy contact our office and allow 72 hours for refills to be completed.    Today you received the following Venofer  iron  infusion.    Iron  Sucrose Injection What is this medication? IRON  SUCROSE (EYE ern SOO krose) treats low levels of iron  (iron  deficiency anemia) in people with kidney disease. Iron  is a mineral that plays an important role in making red blood cells, which carry oxygen  from your lungs to the rest of your body. This medicine may be used for other purposes; ask your health care provider or pharmacist if you have questions. COMMON BRAND NAME(S): Venofer  What should I tell my care team before I take this medication? They need to know if you have any of these conditions: Anemia not caused by low iron  levels Heart disease High levels of iron  in the blood Kidney disease Liver disease An unusual or allergic reaction to iron , other medications, foods, dyes, or preservatives Pregnant or trying to get  pregnant Breastfeeding How should I use this medication? This medication is infused into a vein. It is given by your care team in a hospital or clinic setting. Talk to your care team about the use of this medication in children. While it may be prescribed for children as young as 2 years for selected conditions, precautions do apply. Overdosage: If you think you have taken too much of this medicine contact a poison control center or emergency room at once. NOTE: This medicine is only for you. Do not share this medicine with others. What if I miss a dose? Keep appointments for follow-up doses. It is important not to miss your dose. Call your care team if you are unable to keep an appointment. What may interact with this medication? Do not take this medication with any of the following: Deferoxamine Dimercaprol Other iron  products This medication may also interact with the following: Chloramphenicol Deferasirox This list may not describe all possible interactions. Give your health care provider a list of all the medicines, herbs, non-prescription drugs, or dietary supplements you use. Also tell them if you smoke, drink alcohol, or use illegal drugs. Some items may interact with your medicine. What should I watch for while using this medication? Visit your care team for regular checks on your progress. Tell your care team if your symptoms do not start to get better or if they get worse. You may need blood work done while you are taking this medication. You may need to  eat more foods that contain iron . Talk to your care team. Foods that contain iron  include whole grains or cereals, dried fruits, beans, peas, leafy green vegetables, and organ meats (liver, kidney). What side effects may I notice from receiving this medication? Side effects that you should report to your care team as soon as possible: Allergic reactions--skin rash, itching, hives, swelling of the face, lips, tongue, or throat Low  blood pressure--dizziness, feeling faint or lightheaded, blurry vision Shortness of breath Side effects that usually do not require medical attention (report to your care team if they continue or are bothersome): Flushing Headache Joint pain Muscle pain Nausea Pain, redness, or irritation at injection site This list may not describe all possible side effects. Call your doctor for medical advice about side effects. You may report side effects to FDA at 1-800-FDA-1088. Where should I keep my medication? This medication is given in a hospital or clinic. It will not be stored at home. NOTE: This sheet is a summary. It may not cover all possible information. If you have questions about this medicine, talk to your doctor, pharmacist, or health care provider.  2024 Elsevier/Gold Standard (2023-07-31 00:00:00)   To help prevent nausea and vomiting after your treatment, we encourage you to take your nausea medication as directed.  BELOW ARE SYMPTOMS THAT SHOULD BE REPORTED IMMEDIATELY: *FEVER GREATER THAN 100.4 F (38 C) OR HIGHER *CHILLS OR SWEATING *NAUSEA AND VOMITING THAT IS NOT CONTROLLED WITH YOUR NAUSEA MEDICATION *UNUSUAL SHORTNESS OF BREATH *UNUSUAL BRUISING OR BLEEDING *URINARY PROBLEMS (pain or burning when urinating, or frequent urination) *BOWEL PROBLEMS (unusual diarrhea, constipation, pain near the anus) TENDERNESS IN MOUTH AND THROAT WITH OR WITHOUT PRESENCE OF ULCERS (sore throat, sores in mouth, or a toothache) UNUSUAL RASH, SWELLING OR PAIN  UNUSUAL VAGINAL DISCHARGE OR ITCHING   Items with * indicate a potential emergency and should be followed up as soon as possible or go to the Emergency Department if any problems should occur.  Please show the CHEMOTHERAPY ALERT CARD or IMMUNOTHERAPY ALERT CARD at check-in to the Emergency Department and triage nurse.  Should you have questions after your visit or need to cancel or reschedule your appointment, please contact [facility] [place] - A DEPT OF [facility] [phone]  and follow the prompts.  Office hours are 8:00 a.m. to 4:30 p.m. Monday - Friday. Please note that voicemails left after 4:00 p.m. may not be returned until the following business day.  We are closed weekends and major holidays. You have access to a nurse at all times for urgent questions. Please call the main number to the clinic [phone] and follow the prompts.  For any non-urgent questions, you may also contact your provider using MyChart. We now offer e-Visits for anyone 25 and older to request care online for non-urgent symptoms. For details visit [email or web address].   Also download the MyChart app! Go to the app store, search MyChart, open the app, select [place], and log in with your MyChart username and password.

## 2024-12-22 NOTE — Progress Notes (Signed)
 Patient presents today for iron  infusion of Venofer .  Patient is in satisfactory condition with no new complaints voiced.  Vital signs are stable.  Patient reports taking premeds of Zrytec and Tylenol  prior to arrival at 7:30am today. We will proceed with infusion per provider orders.    Patient tolerated treatment well with no complaints voiced.  Patient left ambulatory in stable condition.  Vital signs stable at discharge.  Follow up as scheduled.

## 2024-12-24 ENCOUNTER — Telehealth: Payer: Self-pay

## 2024-12-24 NOTE — Telephone Encounter (Signed)
 Auth Submission: PENDING Site of care: Site of care: [facility] Payer: aetna medicare Medication & CPT/J Code(s) submitted: Sandostatin  (Octreotide  LAR) G7646 Diagnosis Code:  Route of submission (phone, fax, portal): portal Phone # Fax # Auth type: Buy/Bill PB Units/visits requested: 20mg  q 28days Reference number: [id number] Approval from:  to     Authorization has been DENIED because

## 2024-12-30 ENCOUNTER — Encounter: Attending: Gastroenterology | Admitting: *Deleted

## 2024-12-30 VITALS — BP 145/72 | HR 65 | Temp 97.6°F | Resp 18

## 2024-12-30 DIAGNOSIS — K5521 Angiodysplasia of colon with hemorrhage: Secondary | ICD-10-CM | POA: Insufficient documentation

## 2024-12-30 DIAGNOSIS — K5520 Angiodysplasia of colon without hemorrhage: Secondary | ICD-10-CM | POA: Insufficient documentation

## 2024-12-30 MED ORDER — OCTREOTIDE ACETATE 20 MG IM KIT
20.0000 mg | PACK | Freq: Once | INTRAMUSCULAR | Status: AC
  Administered 2024-12-30: 20 mg via INTRAMUSCULAR
  Filled 2024-12-30: qty 1

## 2024-12-30 NOTE — Progress Notes (Signed)
 Diagnosis: AVM of small bowel  Provider:  Kennedy Pfeiffer NP  Procedure: Injection  Sandostatin, Dose: 20 mg, Site: intramuscular, Number of injections: 1  Injection Site(s): Right upper quad. gluteus  Post Care: Observation period completed  Discharge: Condition: Good, Destination: Home . AVS Provided  Performed by:  Baldwin Darice Helling, RN

## 2025-01-19 ENCOUNTER — Telehealth: Payer: Self-pay | Admitting: *Deleted

## 2025-01-19 ENCOUNTER — Encounter: Payer: Self-pay | Admitting: Gastroenterology

## 2025-01-19 NOTE — Telephone Encounter (Signed)
 Received a letter stating that pt needs to change lubiprostone  24mg  to another drug that is on her formulary or do appeal letter demonstrating why she needs to be on this medication, or requesting an exception from our coverage rules. I scanned a copy in the chart.

## 2025-01-20 NOTE — Telephone Encounter (Signed)
 Pt was approved for lubiprostone  24mg 

## 2025-01-27 ENCOUNTER — Ambulatory Visit

## 2025-01-28 ENCOUNTER — Other Ambulatory Visit: Payer: Self-pay

## 2025-01-28 ENCOUNTER — Emergency Department (HOSPITAL_COMMUNITY)
Admission: EM | Admit: 2025-01-28 | Discharge: 2025-01-28 | Disposition: A | Discharge status: Home / Self Care | Source: Ambulatory Visit | Attending: Emergency Medicine | Admitting: Emergency Medicine

## 2025-01-28 ENCOUNTER — Telehealth: Payer: Self-pay | Admitting: Cardiology

## 2025-01-28 ENCOUNTER — Encounter (HOSPITAL_COMMUNITY): Payer: Self-pay | Admitting: Emergency Medicine

## 2025-01-28 ENCOUNTER — Emergency Department (HOSPITAL_COMMUNITY)

## 2025-01-28 DIAGNOSIS — R002 Palpitations: Secondary | ICD-10-CM

## 2025-01-28 DIAGNOSIS — R42 Dizziness and giddiness: Secondary | ICD-10-CM

## 2025-01-28 DIAGNOSIS — R0602 Shortness of breath: Secondary | ICD-10-CM

## 2025-01-28 LAB — CBC
HCT: 39.3 % (ref 36.0–46.0)
Hemoglobin: 11.8 g/dL — ABNORMAL LOW (ref 12.0–15.0)
MCH: 27.6 pg (ref 26.0–34.0)
MCHC: 30 g/dL (ref 30.0–36.0)
MCV: 92 fL (ref 80.0–100.0)
Platelets: 306 10*3/uL (ref 150–400)
RBC: 4.27 MIL/uL (ref 3.87–5.11)
RDW: 14.6 % (ref 11.5–15.5)
WBC: 5.8 10*3/uL (ref 4.0–10.5)
nRBC: 0 % (ref 0.0–0.2)

## 2025-01-28 LAB — COMPREHENSIVE METABOLIC PANEL WITH GFR
ALT: 12 U/L (ref 0–44)
AST: 23 U/L (ref 15–41)
Albumin: 4.4 g/dL (ref 3.5–5.0)
Alkaline Phosphatase: 96 U/L (ref 38–126)
Anion gap: 15 (ref 5–15)
BUN: 15 mg/dL (ref 8–23)
CO2: 24 mmol/L (ref 22–32)
Calcium: 9.7 mg/dL (ref 8.9–10.3)
Chloride: 101 mmol/L (ref 98–111)
Creatinine, Ser: 1.51 mg/dL — ABNORMAL HIGH (ref 0.44–1.00)
GFR, Estimated: 35 mL/min — ABNORMAL LOW
Glucose, Bld: 92 mg/dL (ref 70–99)
Potassium: 4.9 mmol/L (ref 3.5–5.1)
Sodium: 139 mmol/L (ref 135–145)
Total Bilirubin: 0.4 mg/dL (ref 0.0–1.2)
Total Protein: 7.5 g/dL (ref 6.5–8.1)

## 2025-01-28 LAB — TROPONIN T, HIGH SENSITIVITY
Troponin T High Sensitivity: 18 ng/L (ref 0–19)
Troponin T High Sensitivity: 18 ng/L (ref 0–19)

## 2025-01-28 NOTE — Telephone Encounter (Signed)
 Spoke with pt through [facility] stat transfer. Pt states she has had palpations, SOB and dizziness for the past 3 days. Pt states today it is getting much worse. Bp 145/70 Hr 58 while on the phone. Pt denies having any pain anywhere but did state she feels like she is getting so dizzy she could not stand and walk across the room. Advised pt to call 911 or have someone take her to ED. Advised pt I would forward over to Dr Garnette RN to see about getting her a appointment with him in [place] at a later date. Pt stated she would have someone take her to ED.

## 2025-01-28 NOTE — Discharge Instructions (Signed)
 Your workup did not show any significant abnormality.  Please follow with your cardiologist for further evaluation.  If you experience any new or worsening symptoms please return to the emergency room.

## 2025-01-28 NOTE — ED Provider Notes (Signed)
 " [place] EMERGENCY DEPARTMENT AT [facility] Provider Note   CSN: [id number] Arrival date & time: 01/28/25  1555     Patient presents with: Palpitations, Dizziness, and Shortness of Breath   [name] [name] is a 79 y.o. female with history of A-fib on Eliquis , hypertension, hyperlipidemia, CKD presents with intermittent dizziness, shortness of breath and palpitations for the past 3 days.  Dizziness is described as near syncopal.  Has no associated headache, vision changes, extremity weakness, numbness, difficulty speaking, ambulating or confusion.  Denies any chest pain.  Shortness of breath is associated with palpitations.  No cough or URI symptoms.  Is additionally on flecainide  for her A-fib.  Does report that she will have similar symptoms when she is in A-fib.    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   Chronic kidney disease    Constipation 07/17/2017   GERD (gastroesophageal reflux disease)    Gout    Grade II diastolic dysfunction 11/23/2020   Hyperlipidemia    Hypertension    Hypokalemia    OSA (obstructive sleep apnea) 08/09/2020   PAF (paroxysmal atrial fibrillation) (HCC)    PAF (paroxysmal atrial fibrillation) (HCC)    PUD (peptic ulcer disease)    Syncope    Neurocardiogenic   Past Surgical History:  Procedure Laterality Date   ABDOMINAL AORTOGRAM W/LOWER EXTREMITY Right 12/13/2020   Procedure: ABDOMINAL AORTOGRAM W/LOWER EXTREMITY;  Surgeon: Serene Gaile ORN, MD;  [facility]: [facility] IN[facility];  Service: Cardiovascular;  Laterality: Right;   ABDOMINAL HYSTERECTOMY     AMPUTATION Right 12/14/2020   Procedure: Right fifth toe amputation;  Surgeon: Oris Krystal FALCON, MD;  Location: [facility] OR;  Service: Vascular;  Laterality:  Right;   CATARACT EXTRACTION Bilateral    COLONOSCOPY N/A 10/07/2014   Dr. Harvey: redundant left colon, moderate sized external hemorrhoids    COLONOSCOPY N/A 03/11/2020   Surgeon: Harvey Margo CROME, MD;  9 polyps, majority of which were tubular adenomas, nodular mucosa in the anus s/p biopsy which was benign.   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    HOT HEMOSTASIS  11/27/2020   Procedure: HOT HEMOSTASIS (ARGON PLASMA COAGULATION/BICAP);  Surgeon: Eartha Flavors, Toribio, MD;  Location: [facility];  Service: Gastroenterology;;   LIPOMA RESECTION     PERIPHERAL VASCULAR INTERVENTION Right 12/13/2020   Procedure: PERIPHERAL VASCULAR INTERVENTION;  Surgeon: Serene Gaile ORN, MD;  Location: [facility] INVASIVE CV LAB;  Service: Cardiovascular;  Laterality: Right;  SFA/PT/AT   POLYPECTOMY  03/11/2020   Procedure: POLYPECTOMY;  Surgeon: Harvey Margo CROME, MD;  Location: [facility] END[facility];  Service: Endoscopy;;  cecal, transverse, descending, sigmoid   TEE WITHOUT CARDIOVERSION N/A 05/10/2021   Procedure: TRANSESOPHAGEAL ECHOCARDIOGRAM (TEE) WATCHMEN EVALUATION;  Surgeon: Delford Maude BROCKS, MD;  Location: [facility] ENDOSCOPY;  Service: Cardiovascular;  Laterality: N/A;  Prior to Admission medications  Medication Sig Start Date End Date Taking? Authorizing Provider  acetaminophen  (TYLENOL ) 500 MG tablet Take 1,000 mg by mouth every 6 (six) hours as needed for moderate pain.    [provider]  albuterol  (VENTOLIN  HFA) 108 (90 Base) MCG/ACT inhaler Inhale 2 puffs into  the lungs every 6 (six) hours as needed for wheezing or shortness of breath. 06/17/24   Kennedy Charmaine CROME, NP  calcitRIOL  (ROCALTROL ) 0.25 MCG capsule Take 0.25 mcg by mouth 3 (three) times a week. Monday,Wednesday and Fridays 04/18/22   [provider]  Cholecalciferol (VITAMIN D -3) 25 MCG (1000 UT) CAPS Take 1,000 Units by mouth daily.    [provider]  Colchicine  0.6 MG CAPS Take by mouth as needed. 06/11/23   [provider]  cyclobenzaprine (FLEXERIL) 5 MG tablet Take 5 mg by mouth 3 (three) times daily. 02/20/23   [provider]  diclofenac Sodium (VOLTAREN) 1 % GEL Apply 1 application topically 4 (four) times daily as needed (pain).    [provider]  ELIQUIS  5 MG TABS tablet TAKE 1 TABLET BY MOUTH TWICE A DAY 03/09/24   Debera Jayson MATSU, MD  flecainide  (TAMBOCOR ) 50 MG tablet TAKE 1 TABLET BY MOUTH TWICE A DAY 12/04/24   Waddell Danelle ORN, MD  furosemide  (LASIX ) 20 MG tablet Take 20 mg by mouth daily. In the afternoon In addition to her 40 mg once per day.    [provider]  furosemide  (LASIX ) 40 MG tablet TAKE 1 TABLET BY MOUTH EVERY DAY 11/10/24   Waddell Danelle ORN, MD  gabapentin  (NEURONTIN ) 100 MG capsule Take 1 capsule (100 mg total) by mouth 3 (three) times daily. 12/26/22   Margrette Taft BRAVO, MD  hydrALAZINE  (APRESOLINE ) 50 MG tablet Take 50 mg by mouth 2 (two) times daily. 12/08/24   [provider]  hydrocortisone  (ANUSOL -HC) 2.5 % rectal cream Place rectally 2 (two) times daily as needed for hemorrhoids or anal itching. 06/17/24   Kennedy Charmaine CROME, NP  lubiprostone  (AMITIZA ) 24 MCG capsule Take 1 capsule (24 mcg total) by mouth 2 (two) times daily with a meal. 06/17/24   Kennedy Charmaine CROME, NP  NIFEdipine (ADALAT CC) 30 MG 24 hr tablet Take 30 mg by mouth daily. 12/07/24   [provider]  octreotide  (SANDOSTATIN ) 100 MCG/ML SOLN injection INJECT 1 ML (100 MCG TOTAL) UNDER THE SKIN EVERY 12 (TWELVE) HOURS. 05/15/23    Kennedy Charmaine CROME, NP  ondansetron  (ZOFRAN ) 4 MG tablet Take 1 tablet (4 mg total) by mouth every 6 (six) hours as needed for nausea. 06/23/22   Pearlean Manus, MD  pantoprazole  (PROTONIX ) 40 MG tablet TAKE 1 TABLET BY MOUTH EVERY DAY 06/18/24   Kennedy Charmaine CROME, NP  polyethylene glycol (MIRALAX  / GLYCOLAX ) 17 g packet Take 17 g by mouth daily as needed for mild constipation. 06/23/22   Pearlean Manus, MD  potassium chloride  SA (KLOR-CON ) 20 MEQ tablet Take 20 mEq by mouth in the morning, at noon, and at bedtime.    [provider]  predniSONE  (DELTASONE ) 10 MG tablet Take 1 tablet (10 mg total) by mouth 3 (three) times daily. 12/26/22   Margrette Taft BRAVO, MD  rosuvastatin  (CRESTOR ) 10 MG tablet TAKE 1 TABLET BY MOUTH EVERY DAY 08/21/21   Antonetta Rollene BRAVO, MD  tiZANidine  (ZANAFLEX ) 4 MG tablet Take 1 tablet (4 mg total) by mouth every 6 (six) hours as needed for muscle spasms. 12/26/22   Margrette Taft BRAVO, MD  vitamin C (ASCORBIC ACID) 250 MG tablet Take 250 mg by mouth daily.    [provider]    Allergies: Acetaminophen -codeine, Amlodipine, Amoxicillin, Doxycycline , Allopurinol, and Tramadol    Review of Systems  Respiratory:  Positive for shortness of breath.   Cardiovascular:  Positive for palpitations.  Neurological:  Positive for dizziness.    Updated Vital Signs BP (!) 158/55 (BP Location: Right Arm)   Pulse 62   Temp 98.6 F (37 C) (Oral)   Resp 18   Ht 5' 2 (1.575 m)   Wt 77.1 kg   SpO2 100%   BMI 31.09 kg/m   Physical Exam Vitals and nursing note reviewed.  Constitutional:      General: She is not in acute distress.    Appearance: She is well-developed.  HENT:     Head: Normocephalic and atraumatic.  Eyes:     Conjunctiva/sclera: Conjunctivae normal.  Cardiovascular:     Rate and Rhythm: Normal rate and regular rhythm.     Heart sounds: No murmur heard. Pulmonary:     Effort: Pulmonary effort is normal. No respiratory distress.     Breath  sounds: Normal breath sounds.  Abdominal:     Palpations: Abdomen is soft.     Tenderness: There is no abdominal tenderness.  Musculoskeletal:        General: No swelling.     Cervical back: Neck supple.  Skin:    General: Skin is warm and dry.     Capillary Refill: Capillary refill takes less than 2 seconds.  Neurological:     Mental Status: She is alert.     Comments: Patient is alert and oriented. There is no abnormal phonation. Symmetric smile without facial droop. No pronator drift. Moves all extremities spontaneously. 5/5 strength in upper and lower extremities. . No sensation deficit. There is no nystagmus. EOMI, PERRL. Coordination intact with finger to nose and normal ambulation.    Psychiatric:        Mood and Affect: Mood normal.     (all labs ordered are listed, but only abnormal results are displayed) Labs Reviewed  CBC - Abnormal; Notable for the following components:      Result Value   Hemoglobin 11.8 (*)    All other components within normal limits  COMPREHENSIVE METABOLIC PANEL WITH GFR - Abnormal; Notable for the following components:   Creatinine, Ser 1.51 (*)    GFR, Estimated 35 (*)    All other components within normal limits  TROPONIN T, HIGH SENSITIVITY  TROPONIN T, HIGH SENSITIVITY    EKG: EKG Interpretation Date/Time:  Thursday January 28 2025 16:06:40 EST Ventricular Rate:  63 PR Interval:  196 QRS Duration:  132 QT Interval:  460 QTC Calculation: 470 R Axis:   -4  Text Interpretation: Normal sinus rhythm Non-specific intra-ventricular conduction block Minimal voltage criteria for LVH, may be normal variant ( Cornell product ) Abnormal ECG When compared with ECG of 23-Oct-2024 10:41, Premature atrial complexes are no longer Present Confirmed by Patsey Lot [phone]) on 01/28/2025 5:01:24 PM  Radiology: [facility] Chest Port 1 View Result Date: 01/28/2025 EXAM: 1 VIEW(S) XRAY OF THE CHEST 01/28/2025 04:49:37 PM COMPARISON: 06/21/2022. CLINICAL  HISTORY: Chest pain. FINDINGS: LUNGS AND PLEURA: No focal pulmonary opacity. No pleural effusion. No pneumothorax. HEART AND MEDIASTINUM: Mild cardiomegaly. Atherosclerotic calcifications. BONES AND SOFT TISSUES: No acute osseous abnormality. IMPRESSION: 1. No acute findings. Electronically signed by: Pinkie Pebbles MD 01/28/2025 05:11 PM EST RP Workstation: [id number]  Procedures   Medications Ordered in the ED - No data to display  Clinical Course as of 01/28/25 1833  Thu Jan 28, 2025  1707 Patient with history of A-fib presents with 3 days of intermittent palpitations, shortness of breath and dizziness.  Upon arrival patient is hemodynamically stable and nontoxic-appearing.  She has no associated neurological symptoms or deficits on her exam. Her symptoms do not appear to be vertiginous.  Does not appear to be in A-fib at this time.  Lab work is overall without any significant abnormality.  She is ambulatory.  Ultimately patient is amenable to discharge as clinically it sounds that she has likely had periods of recurrent Afib.  She has a cardiologist that she will follow-up with.  She is anticoagulated and rate controlled.  Strict return precautions provided.  Patient is understanding agreeable to plan. [JT]    Clinical Course User Index [JT] Donnajean Lynwood DEL, PA-C                                 Medical Decision Making Amount and/or Complexity of Data Reviewed Labs: ordered. Radiology: ordered.   This patient presents to the ED with chief complaint(s) of palpitations, shortness of breath and dizziness.  The complaint involves an extensive differential diagnosis and also carries with it a high risk of complications and morbidity.   Pertinent past medical history as listed in HPI  The differential diagnosis includes  Based off exam and history do not suspect CVA, TIA, ACS, PE, pneumonia, pneumothorax Additional history obtained: Records reviewed Care Everywhere/External  Records  Disposition:   Patient will be discharged home. The patient has been appropriately medically screened and/or stabilized in the ED. I have low suspicion for any other emergent medical condition which would require further screening, evaluation or treatment in the ED or require inpatient management. At time of discharge the patient is hemodynamically stable and in no acute distress. I have discussed work-up results and diagnosis with patient and answered all questions. Patient is agreeable with discharge plan. We discussed strict return precautions for returning to the emergency department and they verbalized understanding.     Social Determinants of Health:   none  This note was dictated with voice recognition software.  Despite best efforts at proofreading, errors may have occurred which can change the documentation meaning.        Final diagnoses:  Palpitations  Dizziness  Shortness of breath    ED Discharge Orders     None          Donnajean Lynwood DEL DEVONNA 01/28/25 1833    Patsey Lot, MD 01/28/25 2256  "

## 2025-01-28 NOTE — Telephone Encounter (Signed)
 Patient c/o Palpitations:  STAT if patient reporting lightheadedness, shortness of breath, or chest pain  How long have you had palpitations/irregular HR/ Afib? Are you having the symptoms now? Since yesterday,yes  Are you currently experiencing lightheadedness, SOB or CP? sob  Do you have a history of afib (atrial fibrillation) or irregular heart rhythm? yes  Have you checked your BP or HR? (document readings if available): BP 156/76  HR 71  Are you experiencing any other symptoms? Waves of dizziness

## 2025-01-28 NOTE — ED Triage Notes (Signed)
 Pt to ER with c/o dizziness, shortness of breath and palpitations that started 3 days ago and got worse today.  Pt reports hx of afib.  Pt alert and oriented in no observable distress.

## 2025-02-02 ENCOUNTER — Ambulatory Visit

## 2025-02-23 ENCOUNTER — Ambulatory Visit: Admitting: Physician Assistant

## 2025-03-17 ENCOUNTER — Inpatient Hospital Stay

## 2025-03-17 ENCOUNTER — Inpatient Hospital Stay: Admitting: Oncology
